# Patient Record
Sex: Female | Born: 1935
Health system: Southern US, Community
[De-identification: ages and names within clinical notes are randomized; demographics above are authoritative.]

## PROBLEM LIST (undated history)

## (undated) DIAGNOSIS — I5032 Chronic diastolic (congestive) heart failure: Secondary | ICD-10-CM

## (undated) DIAGNOSIS — I4430 Unspecified atrioventricular block: Secondary | ICD-10-CM

## (undated) DIAGNOSIS — L0390 Cellulitis, unspecified: Secondary | ICD-10-CM

## (undated) DIAGNOSIS — M109 Gout, unspecified: Secondary | ICD-10-CM

## (undated) DIAGNOSIS — Z950 Presence of cardiac pacemaker: Secondary | ICD-10-CM

## (undated) DIAGNOSIS — I10 Essential (primary) hypertension: Secondary | ICD-10-CM

## (undated) DIAGNOSIS — E876 Hypokalemia: Secondary | ICD-10-CM

## (undated) DIAGNOSIS — R011 Cardiac murmur, unspecified: Secondary | ICD-10-CM

## (undated) DIAGNOSIS — R001 Bradycardia, unspecified: Secondary | ICD-10-CM

## (undated) DIAGNOSIS — I272 Pulmonary hypertension, unspecified: Secondary | ICD-10-CM

## (undated) DIAGNOSIS — IMO0002 Reserved for concepts with insufficient information to code with codable children: Secondary | ICD-10-CM

## (undated) DIAGNOSIS — R609 Edema, unspecified: Secondary | ICD-10-CM

## (undated) DIAGNOSIS — M1990 Unspecified osteoarthritis, unspecified site: Secondary | ICD-10-CM

## (undated) DIAGNOSIS — E119 Type 2 diabetes mellitus without complications: Secondary | ICD-10-CM

## (undated) DIAGNOSIS — R9430 Abnormal result of cardiovascular function study, unspecified: Secondary | ICD-10-CM

## (undated) HISTORY — DX: Pulmonary hypertension, unspecified: I27.20

## (undated) HISTORY — DX: Chronic diastolic (congestive) heart failure: I50.32

## (undated) HISTORY — DX: Presence of cardiac pacemaker: Z95.0

## (undated) HISTORY — PX: CHOLECYSTECTOMY: SHX55

## (undated) HISTORY — DX: Cardiac murmur, unspecified: R01.1

## (undated) HISTORY — DX: Unspecified atrioventricular block: I44.30

## (undated) HISTORY — DX: Gout, unspecified: M10.9

## (undated) HISTORY — DX: Edema, unspecified: R60.9

## (undated) HISTORY — DX: Cellulitis, unspecified: L03.90

## (undated) HISTORY — DX: Reserved for concepts with insufficient information to code with codable children: IMO0002

## (undated) HISTORY — DX: Bradycardia, unspecified: R00.1

## (undated) HISTORY — PX: PARTIAL HYSTERECTOMY: SHX80

## (undated) HISTORY — DX: Essential (primary) hypertension: I10

## (undated) HISTORY — PX: HIP ARTHROPLASTY: SHX981

## (undated) HISTORY — DX: Abnormal result of cardiovascular function study, unspecified: R94.30

## (undated) HISTORY — DX: Hypokalemia: E87.6

---

## 2001-04-22 ENCOUNTER — Emergency Department (HOSPITAL_COMMUNITY): Admission: EM | Admit: 2001-04-22 | Discharge: 2001-04-22 | Payer: Self-pay | Admitting: Emergency Medicine

## 2001-10-02 ENCOUNTER — Emergency Department (HOSPITAL_COMMUNITY): Admission: EM | Admit: 2001-10-02 | Discharge: 2001-10-02 | Payer: Self-pay | Admitting: Internal Medicine

## 2003-02-16 ENCOUNTER — Emergency Department (HOSPITAL_COMMUNITY): Admission: EM | Admit: 2003-02-16 | Discharge: 2003-02-16 | Payer: Self-pay | Admitting: Emergency Medicine

## 2004-12-19 ENCOUNTER — Emergency Department (HOSPITAL_COMMUNITY): Admission: EM | Admit: 2004-12-19 | Discharge: 2004-12-19 | Payer: Self-pay | Admitting: Emergency Medicine

## 2007-01-05 ENCOUNTER — Ambulatory Visit (HOSPITAL_COMMUNITY): Admission: RE | Admit: 2007-01-05 | Discharge: 2007-01-05 | Payer: Self-pay | Admitting: Family Medicine

## 2008-07-18 ENCOUNTER — Ambulatory Visit (HOSPITAL_COMMUNITY): Admission: RE | Admit: 2008-07-18 | Discharge: 2008-07-18 | Payer: Self-pay | Admitting: Family Medicine

## 2010-06-19 ENCOUNTER — Emergency Department (HOSPITAL_COMMUNITY): Admission: EM | Admit: 2010-06-19 | Discharge: 2010-06-19 | Payer: Self-pay | Admitting: Emergency Medicine

## 2010-09-24 ENCOUNTER — Ambulatory Visit (HOSPITAL_COMMUNITY): Admission: RE | Admit: 2010-09-24 | Discharge: 2010-09-24 | Payer: Self-pay | Admitting: Rheumatology

## 2010-09-29 ENCOUNTER — Encounter: Admission: RE | Admit: 2010-09-29 | Discharge: 2010-09-29 | Payer: Self-pay | Admitting: Rheumatology

## 2010-11-11 ENCOUNTER — Encounter: Payer: Self-pay | Admitting: Cardiology

## 2010-11-26 ENCOUNTER — Encounter: Payer: Self-pay | Admitting: Cardiology

## 2010-11-29 ENCOUNTER — Encounter (INDEPENDENT_AMBULATORY_CARE_PROVIDER_SITE_OTHER): Payer: Self-pay | Admitting: Family Medicine

## 2010-11-29 ENCOUNTER — Ambulatory Visit (HOSPITAL_COMMUNITY)
Admission: RE | Admit: 2010-11-29 | Discharge: 2010-11-29 | Payer: Self-pay | Source: Home / Self Care | Attending: Family Medicine | Admitting: Family Medicine

## 2010-12-10 ENCOUNTER — Encounter: Payer: Self-pay | Admitting: Cardiology

## 2010-12-20 ENCOUNTER — Encounter: Payer: Self-pay | Admitting: Cardiology

## 2010-12-20 ENCOUNTER — Telehealth: Payer: Self-pay | Admitting: Cardiology

## 2010-12-29 NOTE — Progress Notes (Signed)
Summary: wants to be [name] pt  Phone Note Call from Patient Call back at [phone]   Caller: Daughter/rosa  Reason for Call: Talk to Nurse Summary of Call: pt daughter states she wants to be a pt of dr [name]. pt daughter states her father was a pt with dr [name] and the pt wants to have the same dr. Initial call taken by: Roe Coombs,  December 20, 2010 2:43 PM  Follow-up for Phone Call        Spoke with Dr. Myrtis Ser RN and scheduled patient to see Dr. Myrtis [name] on 01/14/11 @ 1:45pm. She has been having LE edema and would like to see Dr. Myrtis [name] b/c he took care of her husband. Whitney Maeola Sarah RN  December 20, 2010 4:05 PM  Follow-up by: Whitney Maeola Sarah RN,  December 20, 2010 4:05 PM

## 2011-01-05 ENCOUNTER — Ambulatory Visit: Payer: Self-pay | Admitting: Cardiology

## 2011-01-12 ENCOUNTER — Telehealth: Payer: Self-pay | Admitting: Cardiology

## 2011-01-14 ENCOUNTER — Encounter: Payer: Self-pay | Admitting: Cardiology

## 2011-01-14 ENCOUNTER — Ambulatory Visit (INDEPENDENT_AMBULATORY_CARE_PROVIDER_SITE_OTHER): Payer: Medicare Other | Admitting: Cardiology

## 2011-01-14 DIAGNOSIS — R609 Edema, unspecified: Secondary | ICD-10-CM

## 2011-01-14 DIAGNOSIS — I441 Atrioventricular block, second degree: Secondary | ICD-10-CM

## 2011-01-14 DIAGNOSIS — L0291 Cutaneous abscess, unspecified: Secondary | ICD-10-CM

## 2011-01-17 ENCOUNTER — Encounter: Payer: Self-pay | Admitting: Cardiology

## 2011-01-18 NOTE — Progress Notes (Signed)
Summary: question re med. records   Phone Note Call from Patient Call back at Home Phone [phone]   Caller: Daughter/[phone] Reason for Call: Talk to Nurse Summary of Call: pt daughter wants to know if her mother needs to bring any xrays. Initial call taken by: Roe Coombs,  January 12, 2011 8:52 AM  Follow-up for Phone Call        pt sch to see Dr Myrtis Ser as new pt on Fri, attempted to call daughter back, NA x 8 rings Meredith Staggers, RN  January 12, 2011 9:38 AM   Pt daughter calling back call daughter at [phone] on cell phone Judie Grieve  January 12, 2011 12:10 PM  Left message to call back Meredith Staggers, RN  January 12, 2011 12:29 PM   Additional Follow-up for Phone Call Additional follow up Details #1::        spoke w/pts daughter, we have all of pts records from Dr Azzie Almas, RN  January 12, 2011 1:41 PM

## 2011-01-18 NOTE — Miscellaneous (Signed)
  Clinical Lists Changes  Observations: Added new observation of PAST MED HX: Edema EF 65-70%  ..echo... November 29, 2010 Pulmonary hypertension   46 mmHg... echo... January, 201 to  (01/14/2011 8:27) Added new observation of PRIMARY MD: Dr Thalia Party McInnis (01/14/2011 8:27)       Past History:  Past Medical History: Edema EF 65-70%  ..echo... November 29, 2010 Pulmonary hypertension   46 mmHg... echo... January, 201 to

## 2011-01-18 NOTE — Assessment & Plan Note (Signed)
Summary: np6/wpa Patient having LE edema.   Visit Type:  Initial Consult Primary Provider:   Butch Penny, MD  CC:  edema.  History of Present Illness: The patient is referred for evaluation of edema.  I have not seen her as a patient in the past.  I did take care of her husband for many years.  The patient does not have known coronary disease.  She has been bothered with edema for several months.  Her medications have been adjusted including furosemide.  Despite this she continues to have significant edema.  She is not having chest pain, PND, or orthopnea.  Her ambulation is quite limited in general.  This is because of overall leg weakness.  She is mostly at home.  The patient's primary physician and arrange for her to have a 2-D echo on November 29, 2010.  Her ejection fraction was 65-70%.  She does have pulmonary hypertension with right ventricular systolic pressure estimate 46 mm of mercury.  There is no description of significant right ventricular dysfunction.  Labs show that the patient did have a BNP of 150 followed by a BNP of 100  Preventive Screening-Counseling & Management  Alcohol-Tobacco     Smoking Status: never  Caffeine-Diet-Exercise     Does Patient Exercise: no      Drug Use:  no.    Current Medications (verified): 1)  Multivitamins   Tabs (Multiple Vitamin) .... Once Daily 2)  Vitamin D 1000 Unit  Tabs (Cholecalciferol) .... Two Times A Day 3)  Potassium Chloride Crys Cr 20 Meq Cr-Tabs (Potassium Chloride Crys Cr) .... Take One Tablet By Mouth Twice A Day 4)  Furosemide 40 Mg Tabs (Furosemide) .... Take One Tablet By Mouth Two Times A Day 5)  Diazepam 5 Mg Tabs (Diazepam) .... Every 4 Hrs As Needed 6)  Cyclobenzaprine Hcl 5 Mg Tabs (Cyclobenzaprine Hcl) .[name].. 1-2 Four Times A Day As Needed 7)  Meloxicam 15 Mg Tabs (Meloxicam) .... Once Daily 8)  Hydrocodone-Acetaminophen 10-500 Mg Tabs (Hydrocodone-Acetaminophen) .... Every 4 Hrs As Needed  Allergies  (verified): 1)  ! Jonne Ply  Past History:  Past Medical History: Edema EF 65-70%  ..echo... November 29, 2010 Pulmonary hypertension   46 mmHg... echo... January, 201 to Superficial cellulitis right lower leg   January 14, 2011 AV Block    2:1  January 14, 2011  Past Surgical History: Partial hysterectomy Hip Arthroplasty-Total  Family History: Family History of Cardiomyopathy: dad Family History of Diabetes: dad and others Family History of Cancer: son Family History of Coronary Artery Disease: son  Social History: Retired  Widowed  Tobacco Use - No.  Alcohol Use - no Regular Exercise - no Drug Use - no Smoking Status:  never Does Patient Exercise:  no Drug Use:  no  Review of Systems       Patient denies fever, chills, headache, sweats, change in vision, change in hearing, chest pain, cough, nausea vomiting, urinary symptoms.  All other systems are reviewed and are negative.  Vital Signs:  Patient profile:   77 year old female Height:      63 inches Weight:      180 pounds BMI:     32.00 Pulse rate:   48 / minute BP sitting:   164 / 66  (right arm) Cuff size:   regular  Vitals Entered By: Hardin Negus, RMA (January 14, 2011 2:10 PM)  Physical Exam  General:  Patient is stable.  She is in a wheelchair.  She  is overweight. Head:  head is atraumatic. Eyes:  no xanthelasma. Neck:  no jugular venous distention. Chest Wall:  no chest wall tenderness. Lungs:  lungs are clear.  Respiratory effort is nonlabored. Heart:  cardiac exam reveals S1-S2.  There is a soft systolic murmur. Abdomen:  abdomen is soft. Msk:  difficult to fully assess the patient's feet with the marked edema. Extremities:  2+ peripheral edema bilaterally in the lower extremity. Skin:  there is warmth and redness in the medial aspect of the right lower extremity. Psych:  Patient is oriented to person time and place.  Affect is normal.  She is here with her daughter.   Impression &  Recommendations:  Problem # 1:  EDEMA (ICD-782.3) The patient has had significant edema.  She has normal systolic function.  Some of this may be from diastolic dysfunction.  It is possible that her bradycardia plays a role.  She is not hypotensive.  She does drink extra fluid during the day.  She is careful to watch her sodium intake.  Renal function is normal by review of her labs.  I've asked her to cut back one bottle of the extra fluid that she drinks each day.  She will continue to watch her sodium intake.  Her Lasix will be increased to 80 mg 2 times a day for 3 days and then back to 40 mg p.o. b.i.d. she will continue to try to elevate her feet as much as possible.  Problem # 2:  * SUPERFICIAL CELLULITIS it is likely that the skin changes are related to her edema.  I will give her Keflex to be sure there is no superficial infection that has not been treated.  Problem # 3:  ATRIOVENTRICULAR BLOCK, 2ND DEGREE (ICD-426.13)  EKG is done today and reviewed by me.  There is left ventricular hypertrophy.  There appears to be 2:1 AV block.  I have no old EKG for comparison.  The patient will wear a 24-hour Holter monitor to give me a better idea what her underlying rhythm is.  We will discuss this further when I see her back.  She has not had syncope or presyncope.  Orders: Holter Monitor (Holter Monitor)  Patient Instructions: 1)  Your physician recommends that you schedule a follow-up appointment in: 2 weeks with Dr. Myrtis Ser 2)  Your physician has recommended you make the following change in your medication: INCREASE LASIX TO 80MG  TWICE A DAY FOR 3 DAYS. THEN DECREASE LASIX BACK TO 40MG  TWICE A DAY 3)  DRINK ONLY 1 GATORADE BOTTLE OF WATER A DAY 4)  Your physician has recommended that you wear a 24 hour holter monitor.  Holter monitors are medical devices that record the heart's electrical activity. Doctors most often use these monitors to diagnose arrhythmias. Arrhythmias are problems with the  speed or rhythm of the heartbeat. The monitor is a small, portable device. You can wear one while you do your normal daily activities. This is usually used to diagnose what is causing palpitations/syncope (passing out). Prescriptions: KEFLEX 500 MG CAPS (CEPHALEXIN) 1 capsule twice a day for 7 days  #14 x 0   Entered by:   Lisabeth Devoid RN   Authorized by:   Talitha Givens, MD, [facility]   Signed by:   Lisabeth Devoid RN on 01/14/2011   Method used:   Faxed to ...       [facility] (retail)       [address]  [place], [place]  [address]  [place]       Ph: [phone]       Fax: [phone]   RxID:   [id number]

## 2011-01-24 ENCOUNTER — Telehealth: Payer: Self-pay | Admitting: Cardiology

## 2011-01-28 ENCOUNTER — Encounter: Payer: Self-pay | Admitting: Cardiology

## 2011-01-28 ENCOUNTER — Other Ambulatory Visit: Payer: Self-pay | Admitting: Cardiology

## 2011-01-28 ENCOUNTER — Ambulatory Visit (INDEPENDENT_AMBULATORY_CARE_PROVIDER_SITE_OTHER): Payer: Medicare Other | Admitting: Cardiology

## 2011-01-28 ENCOUNTER — Telehealth: Payer: Self-pay | Admitting: Cardiology

## 2011-01-28 DIAGNOSIS — E8779 Other fluid overload: Secondary | ICD-10-CM

## 2011-01-28 DIAGNOSIS — I441 Atrioventricular block, second degree: Secondary | ICD-10-CM

## 2011-01-28 DIAGNOSIS — R609 Edema, unspecified: Secondary | ICD-10-CM

## 2011-01-28 LAB — BASIC METABOLIC PANEL
Chloride: 105 mEq/L (ref 96–112)
GFR: 75.14 mL/min (ref >60.00)
Potassium: 4.1 mEq/L (ref 3.5–5.1)
Sodium: 141 mEq/L (ref 135–145)

## 2011-02-01 NOTE — Progress Notes (Addendum)
Summary: [facility] calling re furosimide sent in today  Phone Note From Pharmacy   Caller: [phone] [facility] Summary of Call: pharmacy has question re furosemide sent in today Initial call taken by: Glynda Jaeger,  January 28, 2011 10:19 AM  Follow-up for Phone Call        I spoke with pharmacist, Ronaldo Miyamoto, and they wanted clarification of furosemide as they had a 40mg  prescription for her as well as the 80mg  script sent today.   they will fill the 80mg  furosemide two times a day  Follow-up by: Lisabeth Devoid RN,  January 28, 2011 10:46 AM

## 2011-02-03 ENCOUNTER — Encounter: Payer: Self-pay | Admitting: Internal Medicine

## 2011-02-03 ENCOUNTER — Ambulatory Visit (INDEPENDENT_AMBULATORY_CARE_PROVIDER_SITE_OTHER): Payer: Medicare Other | Admitting: Internal Medicine

## 2011-02-03 DIAGNOSIS — I441 Atrioventricular block, second degree: Secondary | ICD-10-CM

## 2011-02-03 DIAGNOSIS — I509 Heart failure, unspecified: Secondary | ICD-10-CM

## 2011-02-04 ENCOUNTER — Telehealth: Payer: Self-pay | Admitting: Internal Medicine

## 2011-02-08 NOTE — Letter (Signed)
Summary: Implantable Device Instructions  [facility]ologist, Main Office  [address]   [place], [place] [address]   Phone: [phone]  Fax: [phone]      Implantable Device Instructions  You are scheduled for:  ___X__ Permanent Transvenous Pacemaker _____ Implantable Cardioverter Defibrillator _____ Implantable Loop Recorder _____ Generator Change  on __3/30/12___ with Dr. _TAYLOR____.  1.  Please arrive at the Short Stay Center at [facility] at _7:30 AM____ on the day of your procedure.  2.  Do not eat or drink the night before your procedure.  3.  Complete lab work on __3/23/12 AT 3:00 PM___.  The lab at [facility] is open from 8:30 AM to 1:30 PM and from 2:30 PM to 5:00 PM.  The lab at [facility] is open from 7:30 AM to 5:30 PM.  You do not have to be fasting.  4.  Do NOT take these medications for  HOLD FUROSEMIDE 80 MG AM OF PROCEDURE  5.  Plan for an overnight stay.  Bring your insurance cards and a list of your medications.  6.  Wash your chest and neck with antibacterial soap (any brand) the evening before and the morning of your procedure.  Rinse well.  7.  Education material received:     Pacemaker ___X__           ICD _____           Arrhythmia _____  *If you have ANY questions after you get home, please call the office [phone].  *Every attempt is made to prevent procedures from being rescheduled.  Due to the nauture of Electrophysiology, rescheduling can happen.  The physician is always aware and directs the staff when this occurs.

## 2011-02-08 NOTE — Procedures (Signed)
Summary: summary report  summary report   Imported By: Mirna Mires 01/31/2011 10:14:22  _____________________________________________________________________  External Attachment:    Type:   Image     Comment:   External Document

## 2011-02-08 NOTE — Progress Notes (Signed)
Summary: pt's dtr calling re abx  Phone Note Call from Patient   Caller: [phone] dtr [name] [phone] Reason for Call: Talk to Nurse Summary of Call: pt's dtr calling re getting abx-pt still having problems Initial call taken by: Glynda Jaeger,  January 24, 2011 1:56 PM  Follow-up for Phone Call        Patient's leg is red and warm. She has finished her antbx. prescribed by Dr. Myrtis Ser.  Her leg does not look any worse from when she saw Dr. Myrtis Ser last week but is still burning and is painful. She has an appt. to see Dr. Myrtis Ser on Friday but is wondering if she should start another antbx. I will forward this to Dr. Myrtis Ser for review to see if he would like for her to wait until her f/u with him or see the PA sooner than Friday. Advised pt. to keep the area clean & dry.  Whitney Maeola Sarah RN  January 24, 2011 3:09 PM  Follow-up by: Whitney Maeola Sarah RN,  January 24, 2011 3:09 PM  Additional Follow-up for Phone Call Additional follow up Details #1::        I saw pt. in office after this.

## 2011-02-08 NOTE — Assessment & Plan Note (Signed)
Summary: f2w/ per Dr.Caitrin Pendergraph   Visit Type:  Follow-up Primary Provider:   Butch Penny, MD  CC:  bradycardia and fluid overload.  History of Present Illness: The patient is seen for followup of fluid overload and bradycardia.  I saw her last January 14, 2011.  At that time I thought she might have 21 AV block.  She has weakness but she has not had syncope.  She has worn a Holter and I have reviewed in length.  It appears to me that there is 2:1 block.  I do not know if this is wenchebach or not.  There is no high degree AV block.  I have increased her diuretics for a few days.  I then cut back to her usual dose.  She continues to have edema.  She continues to have some redness in her right lower extremity.  I had treated this with Keflex.  It is somewhat improved.  I'm not convinced that represents active cellulitis any longer.  Current Medications (verified): 1)  Multivitamins   Tabs (Multiple Vitamin) .... Once Daily 2)  Vitamin D 1000 Unit  Tabs (Cholecalciferol) .... Two Times A Day 3)  Potassium Chloride Crys Cr 20 Meq Cr-Tabs (Potassium Chloride Crys Cr) .... Take One Tablet By Mouth Twice A Day 4)  Furosemide 40 Mg Tabs (Furosemide) .... Take One Tablet By Mouth Two Times A Day 5)  Diazepam 5 Mg Tabs (Diazepam) .... Every 4 Hrs As Needed 6)  Cyclobenzaprine Hcl 5 Mg Tabs (Cyclobenzaprine Hcl) .[name].. 1-2 Four Times A Day As Needed 7)  Meloxicam 15 Mg Tabs (Meloxicam) .... Once Daily 8)  Hydrocodone-Acetaminophen 10-500 Mg Tabs (Hydrocodone-Acetaminophen) .... Every 4 Hrs As Needed 9)  Keflex 500 Mg Caps (Cephalexin) .[name].. 1 Capsule Twice A Day For 7 Days  Allergies: 1)  ! Jonne Ply  Past History:  Past Medical History: Last updated: 01/14/2011 Edema EF 65-70%  ..echo... November 29, 2010 Pulmonary hypertension   46 mmHg... echo... January, 201 to Superficial cellulitis right lower leg   January 14, 2011 AV Block    2:1  January 14, 2011  Review of Systems       Patient denies  fever, chills, headache, sweats, rash, change in vision, change in hearing, chest pain, cough, nausea vomiting, urinary symptoms.  All of the systems are reviewed and are negative.  Vital Signs:  Patient profile:   77 year old female Height:      63 inches Weight:      180 pounds BMI:     32.00 Pulse rate:   49 / minute Resp:     16 per minute BP sitting:   165 / 51  (left arm)  Vitals Entered By: Kem Parkinson (January 28, 2011 9:28 AM)  Physical Exam  General:  patient is stable today but in wheelchair. Head:  head is atraumatic. Eyes:  no xanthelasma. Neck:  no jugular venous distention. Chest Wall:  no chest wall tenderness. Lungs:  lungs are clear.  Respiratory effort is nonlabored. Heart:  cardiac exam reveals S1-S2.  No clicks or significant murmurs. Abdomen:  abdomen is soft. Msk:  patient does have deformities of her ankles. Extremities:  2+ peripheral edema. Skin:  The area on her right anterior lower extremity is still mildly warm and slightly reddened. Psych:  patient is oriented to person time and place.  Affect is normal.   Impression & Recommendations:  Problem # 1:  ATRIOVENTRICULAR BLOCK, 2ND DEGREE (ICD-426.13)  As described in the  history of present illness I'm still not totally sure about her rhythm.  I believe it probably is 2-1 AV block.  I suspect that she will do better with sinus rhythm with 1:1 conduction.I will refer her to electrophysiology for further assessment.  It is possible that a pacemaker may be indicated both for her rhythm and her symptoms of fatigue and her volume overload.  Orders: EP Referral (Cardiology EP Ref ) TLB-BMP (Basic Metabolic Panel-BMET) (80048-[id number])  Problem # 2:  * SUPERFICIAL CELLULITIS I believe that the mild area on her leg will improve as she is diuresis.  She does need to keep her legs elevated.  I've chosen not to use any further antibiotics  Problem # 3:  EDEMA (ICD-782.3)  The patient's weight is a same.   I will increase her diuretic to 80 mg p.o. b.i.d.  Chemistry will be checked today.  I'll see her for followup.  Orders: TLB-BMP (Basic Metabolic Panel-BMET) ([id number])  Patient Instructions: 1)  Your physician recommends that you schedule a follow-up appointment in: 4 weeks with Dr. Myrtis Ser 2)  Your physician recommends that you  have lab work today 3)  Your physician has recommended you make the following change in your medication: Increase Furosemide 4)  You have been referred to our Electrophysiology doctors for your heart rhythm.   Prescriptions: FUROSEMIDE 80 MG TABS (FUROSEMIDE) Take one tablet by mouth two times a day  #120 x 6   Entered by:   Lisabeth Devoid RN   Authorized by:   Talitha Givens, MD, [facility]   Signed by:   Lisabeth Devoid RN on 01/28/2011   Method used:   Faxed to ...       [address] (retail)       [address]       [place], [place]  [address]  [place]       Ph: [phone]       Fax: [phone]   RxID:   [id number]

## 2011-02-08 NOTE — Assessment & Plan Note (Signed)
Summary: EVAL FOR 2:1 AV BLOCK POSSIBLE PACEMAKER PER KATZ/SL   Visit Type:  Follow-up Primary Provider:   Butch Penny, MD  CC:  Possible pacemaker evaluation.  History of Present Illness: [name] is referred today by Dr. Myrtis Ser for consideration for PPM. She is a pleasant 77 yo woman with a h/o fatigue and weakness who was found on Holter monitoring to have 2:1 AV block both during the day and at night. She has not had frank syncope. She is not on any AV nodal blocking drugs. She had been bothered by some superficial cellulitis of the lower extremity. She does have mild pulmonary hypertension. She has 2+ peripheral edema.  Current Medications (verified): 1)  Multivitamins   Tabs (Multiple Vitamin) .... Once Daily 2)  Vitamin D 1000 Unit  Tabs (Cholecalciferol) .... Two Times A Day 3)  Potassium Chloride Crys Cr 20 Meq Cr-Tabs (Potassium Chloride Crys Cr) .... Take One Tablet By Mouth Twice A Day 4)  Furosemide 80 Mg Tabs (Furosemide) .... Take One Tablet By Mouth Two Times A Day 5)  Diazepam 5 Mg Tabs (Diazepam) .... Every 4 Hrs As Needed 6)  Cyclobenzaprine Hcl 5 Mg Tabs (Cyclobenzaprine Hcl) .[name].. 1-2 Four Times A Day As Needed 7)  Meloxicam 15 Mg Tabs (Meloxicam) .... Once Daily 8)  Hydrocodone-Acetaminophen 10-500 Mg Tabs (Hydrocodone-Acetaminophen) .... Every 4 Hrs As Needed 9)  Keflex 500 Mg Caps (Cephalexin) .[name].. 1 Capsule Twice A Day For 7 Days  Allergies: 1)  ! Jonne Ply  Past History:  Past Medical History: Last updated: 01/14/2011 Edema EF 65-70%  ..echo... November 29, 2010 Pulmonary hypertension   46 mmHg... echo... January, 201 to Superficial cellulitis right lower leg   January 14, 2011 AV Block    2:1  January 14, 2011  Past Surgical History: Last updated: 01/14/2011 Partial hysterectomy Hip Arthroplasty-Total  Family History: Last updated: 01/14/2011 Family History of Cardiomyopathy: dad Family History of Diabetes: dad and others Family History of Cancer:  son Family History of Coronary Artery Disease: son  Social History: Last updated: 01/14/2011 Retired  Widowed  Tobacco Use - No.  Alcohol Use - no Regular Exercise - no Drug Use - no  Review of Systems       All systems reviewed and negative except as noted in the HPI and "stinging" in her legs when she wears TED hose.  Vital Signs:  Patient profile:   77 year old female Height:      63 inches Pulse rate:   49 / minute Pulse rhythm:   irregular Resp:     18 per minute BP sitting:   180 / 47  (left arm) Cuff size:   large  Vitals Entered By: Vikki Ports (February 03, 2011 10:25 AM)  Physical Exam  General:  patient is stable today but in wheelchair. Head:  head is atraumatic. Eyes:  no xanthelasma. Neck:  no jugular venous distention. Lungs:  lungs are clear.  Respiratory effort is nonlabored. Heart:  cardiac exam reveals S1-S2.  No clicks or significant murmurs. Abdomen:  abdomen is soft. Msk:  patient does have deformities of her ankles. Extremities:  2+ peripheral edema.   EKG  Procedure date:  02/03/2011  Findings:      Normal sinus rhythm with rate of:  98 with 2:1 AV block.  Impression & Recommendations:  Problem # 1:  ATRIOVENTRICULAR BLOCK, 2ND DEGREE (ICD-426.13) The patient has 2:1 AV block for which she is symptomatic. I have discussed the risks/benefits/goals/expectations of PPM  and she wishes to proceed.  Problem # 2:  EDEMA (ICD-782.3) I have encouraged a low sodium diet.

## 2011-02-10 ENCOUNTER — Telehealth: Payer: Self-pay | Admitting: Cardiology

## 2011-02-10 DIAGNOSIS — M7989 Other specified soft tissue disorders: Secondary | ICD-10-CM

## 2011-02-10 NOTE — Telephone Encounter (Signed)
Spoke w/[name] she states she is concerned about her moms leg, she states it is still swelling and she wants to make sure its ok for pacer implant next week.  She doesn't know much more info so pt was called, she states leg overall seems some better w/increased furosemide, no SOB, leg is still red and painful, there is no drainage or open wound, she has not had any fevers, will let Dr Myrtis Ser know and call her back

## 2011-02-11 ENCOUNTER — Other Ambulatory Visit (INDEPENDENT_AMBULATORY_CARE_PROVIDER_SITE_OTHER): Payer: Medicare Other | Admitting: *Deleted

## 2011-02-11 DIAGNOSIS — Z79899 Other long term (current) drug therapy: Secondary | ICD-10-CM

## 2011-02-11 DIAGNOSIS — I498 Other specified cardiac arrhythmias: Secondary | ICD-10-CM

## 2011-02-11 DIAGNOSIS — R609 Edema, unspecified: Secondary | ICD-10-CM

## 2011-02-11 DIAGNOSIS — R0602 Shortness of breath: Secondary | ICD-10-CM

## 2011-02-11 DIAGNOSIS — I441 Atrioventricular block, second degree: Secondary | ICD-10-CM

## 2011-02-11 DIAGNOSIS — Z01810 Encounter for preprocedural cardiovascular examination: Secondary | ICD-10-CM

## 2011-02-11 DIAGNOSIS — I4430 Unspecified atrioventricular block: Secondary | ICD-10-CM

## 2011-02-11 LAB — CBC WITH DIFFERENTIAL/PLATELET
Basophils Absolute: 0.1 10*3/uL (ref 0.0–0.1)
Eosinophils Relative: 3.8 % (ref 0.0–5.0)
HCT: 44.3 % (ref 36.0–46.0)
Lymphocytes Relative: 29.5 % (ref 12.0–46.0)
Lymphs Abs: 2.2 10*3/uL (ref 0.7–4.0)
Monocytes Relative: 10.3 % (ref 3.0–12.0)
Neutrophils Relative %: 55.3 % (ref 43.0–77.0)
Platelets: 311 10*3/uL (ref 150.0–400.0)
RDW: 13.7 % (ref 11.5–14.6)
WBC: 7.4 10*3/uL (ref 4.5–10.5)

## 2011-02-11 LAB — APTT: aPTT: 31.5 s — ABNORMAL HIGH (ref 21.7–28.8)

## 2011-02-11 LAB — PROTIME-INR
INR: 1 ratio (ref 0.8–1.0)
Prothrombin Time: 11.3 s (ref 10.2–12.4)

## 2011-02-11 LAB — BRAIN NATRIURETIC PEPTIDE: Pro B Natriuretic peptide (BNP): 53.7 pg/mL (ref 0.0–100.0)

## 2011-02-11 NOTE — Telephone Encounter (Signed)
We need to be sure that there is no definite infection and her leg or pacemakers placed.  I am not able to assess this problem any further.  Please make an appointment for the patient to be seen by infectious disease.  Please be sure that my recent notes and notes from our electrophysiology team go to the infectious disease consultant.

## 2011-02-11 NOTE — Telephone Encounter (Signed)
Per Dr Myrtis Ser he would like pt to hold off on pacer right now and have ID evaluate pts leg first, pts daughter is aware, am trying to get her in w/ID will call daughter back on Tue to touch base hopefully will have appt by then, will let Dr Ladona Ridgel and Tresa Endo know what is going on

## 2011-02-15 NOTE — Telephone Encounter (Signed)
Pt daughter is calling re pt surgery. Pt daughter wants to know why pt surgery was canceled

## 2011-02-16 ENCOUNTER — Telehealth: Payer: Self-pay | Admitting: Cardiology

## 2011-02-16 ENCOUNTER — Encounter: Payer: Self-pay | Admitting: Cardiology

## 2011-02-16 NOTE — Telephone Encounter (Signed)
Documented in other phone mess

## 2011-02-16 NOTE — Telephone Encounter (Signed)
Left vm for daughter that I am waiting to hear back from infectious disease

## 2011-02-16 NOTE — Telephone Encounter (Signed)
Daughter aware pacer implant cx'd for now, Tresa Endo is aware and has cx'd, am working on getting pt appt w/infectious disease

## 2011-02-17 NOTE — Progress Notes (Signed)
Summary: pt's dtr calling re procedure being done sooner**2nd call  Phone Note Call from Patient   Caller: Daughter Rosezella [place] [phone] Reason for Call: Talk to Nurse Summary of Call: pt's dtr calling re some problems pt 's having that may require her procedure to be moved up-pls call Initial call taken by: Glynda Jaeger,  February 04, 2011 8:24 AM  Follow-up for Phone Call        spoke w/pts daughter she is in [place], she is a little concerned about PPM not being sch until 3/30, pt is not any worse or having any new symptoms but cont to have fatigue, advised it isn't an emergency her symptoms have not changed, if Dr Ladona Ridgel thought it should be done right away they would have sch it sooner, Dr Ladona Ridgel only does procedures a day or so a week, she still would like to discuss w/Dr Ladona Ridgel since she wasn't unable to come to appt yest.  advised Dr Ladona Ridgel not here today, again reassured daughter and advised I could have Tresa Endo call her on Mon. she is agreeable, she states she just wants to know from the MD that its ok to wait the 2 weeks and she'll put it in Gods hands Follow-up by: Meredith Staggers, RN,  February 04, 2011 8:34 AM  Additional Follow-up for Phone Call Additional follow up Details #1::        pt daughter called again to see if procedure will be moved up cause she has to get off work and has to put her request in early enough to get here she lives out of town [place]  February 07, 2011 9:27 AM  spoke with daughter and let her know that it is okay to wait until until 02/18/11 as scheduled Dennis Bast, RN, BSN  February 07, 2011 2:20 PM

## 2011-02-18 ENCOUNTER — Ambulatory Visit (HOSPITAL_COMMUNITY): Admission: RE | Admit: 2011-02-18 | Payer: Medicare Other | Source: Ambulatory Visit | Admitting: Internal Medicine

## 2011-02-18 NOTE — Telephone Encounter (Signed)
Spoke w/daughter, she states ID has not called her to set up appt, explained they were suppose to call her appt will be in later April, Dr Myrtis Ser and Ladona Ridgel are ok w/waiting for pacer, will f/u w/ID and have them call her to sch appt

## 2011-02-21 ENCOUNTER — Telehealth: Payer: Self-pay | Admitting: Cardiology

## 2011-02-21 NOTE — Telephone Encounter (Signed)
appt w/infectious disease sch for 4/18 at 11:15

## 2011-02-21 NOTE — Telephone Encounter (Signed)
[name] still has not heard from ID, called them back they state they will call her now and call me back w/appt date

## 2011-02-21 NOTE — Telephone Encounter (Signed)
Doc. In phone encounter dated 3/22

## 2011-02-22 ENCOUNTER — Telehealth: Payer: Self-pay | Admitting: Cardiology

## 2011-02-22 NOTE — Telephone Encounter (Signed)
Pt daughter states pt is not feeling well pt is weak.

## 2011-02-22 NOTE — Telephone Encounter (Signed)
Spoke w/pt's daughter, she feels pt's leg is getting worse and she may need to be admitted, still w/some redness and swelling, no drainage, pt is sch to see Infectious disease later this month.  Daughter feels pt needs to be admitted so her leg can be evaluated, advised if she was that concerned about it could see pcp or urgent care to have evaluated.  Again ask about being admitted, advised if her and family felt she needed to be in the hospital she could go to ER for evaluation and they will admit her if they feel it is necessary , she states she will call and discuss w/her brother who lives here in town.

## 2011-02-28 ENCOUNTER — Encounter: Payer: Self-pay | Admitting: Cardiology

## 2011-03-02 ENCOUNTER — Ambulatory Visit (INDEPENDENT_AMBULATORY_CARE_PROVIDER_SITE_OTHER): Payer: Medicare Other | Admitting: Cardiology

## 2011-03-02 ENCOUNTER — Ambulatory Visit: Payer: Medicare Other | Admitting: Cardiology

## 2011-03-02 ENCOUNTER — Encounter: Payer: Self-pay | Admitting: Cardiology

## 2011-03-02 DIAGNOSIS — I272 Pulmonary hypertension, unspecified: Secondary | ICD-10-CM | POA: Insufficient documentation

## 2011-03-02 DIAGNOSIS — R609 Edema, unspecified: Secondary | ICD-10-CM

## 2011-03-02 DIAGNOSIS — I4430 Unspecified atrioventricular block: Secondary | ICD-10-CM | POA: Insufficient documentation

## 2011-03-02 DIAGNOSIS — L0291 Cutaneous abscess, unspecified: Secondary | ICD-10-CM

## 2011-03-02 DIAGNOSIS — L0390 Cellulitis, unspecified: Secondary | ICD-10-CM | POA: Insufficient documentation

## 2011-03-02 NOTE — Assessment & Plan Note (Signed)
The patient has intermittent 21 AV block.  She is stable with this.  There's been no syncope.  This can be followed clinically until the issue with her right leg is resolved.  After that time she can get her pacemaker.

## 2011-03-02 NOTE — Assessment & Plan Note (Signed)
I am concerned about the findings in her right leg.  We need to be sure that she does not have deep venous thrombosis.  I also need more help from the vascular surgeons with specialty in venous disease.  I've arranged for consultation with the vascular surgery team tomorrow.

## 2011-03-02 NOTE — Patient Instructions (Signed)
Your physician recommends that you schedule a follow-up appointment in: 6 weeks  

## 2011-03-02 NOTE — Assessment & Plan Note (Signed)
The patient has normal systolic function.  She does have a component of diastolic dysfunction.  However I'm hesitant to push her diuretics any further.  There is clearly a difference in her right leg from the left leg.

## 2011-03-02 NOTE — Progress Notes (Signed)
HPI The patient is seen today in followup to one AV block and swelling in her leg.  She has been seen by electrophysiology and it is felt  that a pacemaker would be appropriate.  This had been put on hold because of persistent discomfort in her right leg.  When I saw her I thought it was related to edema. I did give her a 10 day course of Keflex at that time.  This helped a small amount but the problem persists.  She has a lot of discomfort in this area.  Initially I thought there might be an ongoing infection and I asked for infectious disease consultation.  Because of scheduling difficulties this has been delayed.  The patient is here today and I'm concerned about her right lower extremity.  I do not believe that there is urgency but rapid evaluation by vascular surgery would be helpful.  I've arranged for that for tomorrow.  Allergies  Allergen Reactions  . Aspirin     REACTION: rash    Current Outpatient Prescriptions  Medication Sig Dispense Refill  . Cholecalciferol (VITAMIN D) 1000 UNITS capsule Take 1,000 Units by mouth 2 (two) times daily.        . cyclobenzaprine (FLEXERIL) 5 MG tablet Take 5 mg by mouth as directed.        . diazepam (VALIUM) 5 MG tablet 5 mg. Every 4 hours as needed       . furosemide (LASIX) 80 MG tablet Take 80 mg by mouth 2 (two) times daily.        [name] HYDROcodone-acetaminophen (LORTAB) 10-500 MG per tablet Take 1 tablet by mouth every 4 (four) hours as needed.        . meloxicam (MOBIC) 15 MG tablet Take 15 mg by mouth daily.        . Multiple Vitamin (MULTIVITAMIN) capsule Take 1 capsule by mouth daily.        . potassium chloride SA (K-DUR,KLOR-CON) 20 MEQ tablet Take 20 mEq by mouth 2 (two) times daily.          History   Social History  . Marital Status: Widowed    Spouse Name: N/A    Number of Children: N/A  . Years of Education: N/A   Occupational History  . Retired    Social History Main Topics  . Smoking status: Never Smoker   . Smokeless  tobacco: Not on file  . Alcohol Use: No  . Drug Use: No  . Sexually Active: Not on file   Other Topics Concern  . Not on file   Social History Narrative   Retired Widowed Tobacco Use - No. Alcohol Use - noRegular Exercise - noDrug Use - no    Family History  Problem Relation Age of Onset  . Cardiomyopathy Father   . Diabetes Father   . Cancer       son  . Coronary artery disease      son    Past Medical History  Diagnosis Date  . Edema     E. EF 65-70%, echo, November 29, 2010  . Pulmonary hypertension      46 mmHg... echo... January, 201 to  . Cellulitis     Superficial cellulitis right lower leg   January 14, 2011  . AV block      2:1  January 14, 2011 / pacemaker plan when cellulitis resolved in length    Past Surgical History  Procedure Date  . Partial hysterectomy   .  Hip arthroplasty     total    ROS  Patient denies fever, chills, headache, sweats, change in vision, change in hearing, chest pain, cough, nausea vomiting, urinary symptoms.  All other systems are reviewed and are negative.  PHYSICAL EXAM Patient is a wheelchair today.  Her right leg is mildly uncomfortable.  She is oriented to person time and place.  Affect is normal.  She is here with a family member.  Head is atraumatic.  There is no xanthelasma.  There is no guarding distention.  Lungs are clear.  Respiratory effort is unlabored.  Cardiac exam reveals S1-S2.  There are no clicks or significant murmurs.  The abdomen is soft.  The patient has trace edema in the left lower extremity.  There is more marked edema in her right lower extremity and there is warmth.  There is mild redness.  Filed Vitals:   03/02/11 1357  BP: 164/66  Pulse: 58  Resp: 18  Height: 5\' 2"  (1.575 m)  Weight: 173 lb 6.4 oz (78.654 kg)    EKG  Not done today.  ASSESSMENT & PLAN

## 2011-03-03 ENCOUNTER — Ambulatory Visit (INDEPENDENT_AMBULATORY_CARE_PROVIDER_SITE_OTHER): Payer: Medicare Other

## 2011-03-03 ENCOUNTER — Encounter (INDEPENDENT_AMBULATORY_CARE_PROVIDER_SITE_OTHER): Payer: Medicare Other

## 2011-03-03 DIAGNOSIS — M7989 Other specified soft tissue disorders: Secondary | ICD-10-CM

## 2011-03-03 DIAGNOSIS — R609 Edema, unspecified: Secondary | ICD-10-CM

## 2011-03-03 DIAGNOSIS — M79609 Pain in unspecified limb: Secondary | ICD-10-CM

## 2011-03-03 NOTE — Consult Note (Signed)
NEW PATIENT CONSULTATION  [name], SEGUIN [name] DOB:  1931/10/06                                       03/03/2011 [id number]#:[id number]  CHIEF COMPLAINT:  Right lower extremity swelling and tenderness, rule out DVT.  HISTORY OF PRESENT ILLNESS:  Patient is a 78 year old woman with a history of high blood pressure and congestive heart failure who has a many-month history of swelling and edema in bilateral lower extremities for which she is on 80 mg of Lasix twice daily.  She was put on compression stockings; however, she could not tolerate these on her leg, and it caused a small abrasion on the anterior shin of the right leg. She was recently seen by Dr. Myrtis Ser and found to have a swollen, tender right leg, which was more firm and also a little bit of cellulitis around this abrasion on the anterior shin of her right leg.  She was sent to [place] to rule out DVT and also for treatment of the abrasion.  She had a course of Keflex for this lesion approximately 4 weeks ago, and the patient states it did feel better.  She complained of a burning pain in the leg and around the abrasion but no claudication, as the patient is pretty much wheelchair-bound.  She denies any other rest pain or night pain in either lower extremity.  Past medical history significant for high blood pressure and congestive heart failure.  She also had a right hip replacement.  SOCIAL HISTORY:  She is retired and widowed.  She has never smoked, and she does not drink any alcohol.  FAMILY HISTORY:  Father had cardiomyopathy and diabetes.  She has a son with cancer and coronary artery disease.  REVIEW OF SYSTEMS:  She has had generalized weight loss.  She is 5 feet 2 inches tall, 173 pounds. VASCULAR:  She does have pain in her legs "with walking."  She denies any stroke, mini strokes or TIAs. CARDIAC:  She has shortness of breath with exertion secondary to her CHF.  She denies any arrhythmia or chest pain.   GASTROINTESTINAL:  She denies any black, tarry stools, hiatal hernia, diarrhea, or constipation. NEUROLOGIC:  She denies any dizziness, blackouts, headaches or seizures. PULMONARY:  She is not on home oxygen.  She denies any productive cough, asthma, wheezing or bronchitis. HEMATOLOGIC:  She had no bleeding or clotting disorders. URINARY:  She denies any kidney disease or burning or frequency with urination. EARS, NOSE, AND THROAT:  Negative for nosebleeds or sore throat. MUSCULOSKELETAL:  She does have back spasms. PSYCHIATRIC:  She denies any depression or anxiety.  PHYSICAL EXAMINATION:  This is an obese African American female in no acute distress.  Blood pressure is 164/66.  Her pulse was 60, respiratory rate was 18.  Her lungs are clear.  Heart:  Rate and rhythm was regular.  No clicks or significant murmurs.  Abdomen was soft.  She has pitting edema in bilateral lower extremities.  The right lower extremity has a small superficial abrasion on the anterior shin with some erythema around it.  The calf is more firm than the left; however, it is nontender.  She complains of burning pain to palpation on the medial aspect of the leg and shin near the abrasion.  She had good Doppler flow in the dorsalis pedis, posterior tibial, and peroneal arteries bilaterally.  Both  feet were warm and pink.  She has some venous stasis changes in the right lower extremity and some varicosities noted.  Ultrasound for DVT showed no DVT.  All vessels were compressible.  It was difficult to see some of the smaller veins per exam.  ASSESSMENT: 1. Probable peripheral vascular disease with small abrasion in the     anterior shin of the right lower extremity. 2. No deep venous thrombosis per Doppler study in the right lower     extremity.  PLAN:  Place the patient on Cipro for a week.  Will bring her back in 1- 2 weeks to be seen by one of the surgeons.  She will have ABIs at that time to assess blood  flow to bilateral lower extremities.  Della Goo, PA-C  Larina Earthly, M.D. Electronically Signed  RR/MEDQ  D:  03/03/2011  T:  03/03/2011  Job:  [id number]  cc:   Luis Abed, MD, [facility]

## 2011-03-07 ENCOUNTER — Ambulatory Visit: Payer: Medicare Other | Admitting: Infectious Diseases

## 2011-03-07 NOTE — Procedures (Unsigned)
DUPLEX DEEP VENOUS EXAM - LOWER EXTREMITY  INDICATION:  Bilateral lower extremity pain and edema.  HISTORY:  Edema:  Yes. Trauma/Surgery:  No. Pain:  Yes. PE:  No. Previous DVT:  No. Anticoagulants:  No. Other:  DUPLEX EXAM:               CFV   SFV   PopV  PTV       GSV               R  L  R  L  R  L  R    L    R  L Thrombosis    0  0  0  0  0  0  N/V  N/V  0  0 Spontaneous   +  +  +  +  +  +            +  + Phasic        +  +  +  +  +  +            +  + Augmentation  +  +  +  +  +  +            +  + Compressible  +  +  +  +  +  +            +  + Competent     +  +  +  +  +  +            +  +  Legend:  + - yes  o - no  p - partial  D - decreased   IMPRESSION: 1. Technically difficult and limited evaluation of the bilateral lower     extremities due to marked vessel depth edema, small vessel size and     patient inability to externally rotate extremities.  Calf veins not     visualized due to previous statement. 2. No evidence of deep venous thrombosis is identified in vessels     evaluated.        _____________________________ V. Charlena Cross, MD  SH/MEDQ  D:  03/03/2011  T:  03/03/2011  Job:  [id number]

## 2011-03-09 ENCOUNTER — Ambulatory Visit: Payer: Medicare Other | Admitting: Infectious Diseases

## 2011-03-14 ENCOUNTER — Ambulatory Visit (INDEPENDENT_AMBULATORY_CARE_PROVIDER_SITE_OTHER): Payer: Medicare Other | Admitting: Surgery

## 2011-03-14 ENCOUNTER — Telehealth: Payer: Self-pay | Admitting: Cardiology

## 2011-03-14 ENCOUNTER — Encounter (INDEPENDENT_AMBULATORY_CARE_PROVIDER_SITE_OTHER): Payer: Medicare Other

## 2011-03-14 DIAGNOSIS — L98499 Non-pressure chronic ulcer of skin of other sites with unspecified severity: Secondary | ICD-10-CM

## 2011-03-14 DIAGNOSIS — M79609 Pain in unspecified limb: Secondary | ICD-10-CM

## 2011-03-14 NOTE — Telephone Encounter (Signed)
Pt daughter needs to know the dr name for VASCULAR SURGERY referral.

## 2011-03-14 NOTE — Telephone Encounter (Signed)
Patient's daughter needed the number for the [facility] regarding an order for home health that they ordered. I gave her the number. She will call back if needed.

## 2011-03-15 NOTE — Assessment & Plan Note (Signed)
OFFICE VISIT  KAYLINN, DEDIC DOB:  June 23, 1931                                       03/14/2011 [id number]#:[id number]  REASON FOR VISIT:  Followup.  HISTORY:  This is a 77 year old female that saw Della Goo, PA-C last week for right lower extremity swelling and tenderness.  She was ruled out for DVT.  She did have an abrasion on the right anterior side of her ankle.  She is brought back today for wound check as well as arterial evaluation.  She was started on antibiotics.  The patient states that her swelling has gone down since her last visit.  PHYSICAL EXAM:  Vital signs:  Heart rate is 64 and irregular, blood pressure 140/72, respiratory rate 20.  General:  She is well-appearing, in no distress.  Cardiovascular:  Pedal pulses are not palpable.  She has marked edema on the left greater than right.  There is a small 2 cm abrasion to the anterior side of her right leg with surrounding erythema which has improved.  DIAGNOSTIC STUDIES:  Ultrasound was performed today.  Shows ABI of 0.96 on the right with triphasic signal, 0.61 on the left with biphasic signal.  PLAN:  With the patient's erythema around this small wound on her right shin I think that we need to be very aggressive in getting this to fully heal.  She obviously has adequate circulation to get this to heal.  I think it is limited by the swelling.  I am going to place her in an Unna boot for approximate 2 weeks to help with the swelling.  I am also going to continue her Cipro for another 10 days.  I will see her back in 2 weeks.  I am giving her a prescription for a girdle length compression stocking.    Jorge Ny, MD Electronically Signed  VWB/MEDQ  D:  03/14/2011  T:  03/15/2011  Job:  [id number]  cc:   Luis Abed, MD, [facility]

## 2011-03-28 ENCOUNTER — Ambulatory Visit: Payer: Medicare Other | Admitting: Surgery

## 2011-04-04 ENCOUNTER — Ambulatory Visit (INDEPENDENT_AMBULATORY_CARE_PROVIDER_SITE_OTHER): Payer: Medicare Other | Admitting: Surgery

## 2011-04-04 DIAGNOSIS — L98499 Non-pressure chronic ulcer of skin of other sites with unspecified severity: Secondary | ICD-10-CM

## 2011-04-04 DIAGNOSIS — I739 Peripheral vascular disease, unspecified: Secondary | ICD-10-CM

## 2011-04-05 NOTE — Assessment & Plan Note (Signed)
OFFICE VISIT  LATREASE, KUNDE DOB:  1931/08/18                                       04/04/2011 [id number]#:[id number]  Patient returns today for follow-up.  She was initially seen for right lower extremity swelling and tenderness.  She was ruled out for DVT. She did have an abrasion on the right side of her ankle.  She has an ABI of 0.96 on the right and 0.61 on the left with significant swelling.  I put her in an Radio broadcast assistant.  She is back today for follow-up.  Her wound is completely healed.  She has been fitted for girdle-type compression stockings.  PHYSICAL EXAMINATION:  Her heart rate is 44.  Her blood pressure is 211/40.  Respiratory rate is 40.  General:  She is resting comfortably in no distress.  Respirations are nonlabored.  Extremities:  The ulcer on the anterior side of the right leg has completely healed.  Pulses are not palpable.  She has significant bilateral edema.  At this point, I have reiterated that she is going to need to continue to have compression placed on her legs to avoid future ulcerations and healing issues.  She has already been fitted for this and is going to pick them up soon.  I will see her back on a p.r.n. basis.    Jorge Ny, MD Electronically Signed  VWB/MEDQ  D:  04/04/2011  T:  04/05/2011  Job:  [id number]  cc:   Dr. Myrtis Ser

## 2011-04-14 ENCOUNTER — Encounter: Payer: Self-pay | Admitting: Cardiology

## 2011-04-14 ENCOUNTER — Ambulatory Visit (INDEPENDENT_AMBULATORY_CARE_PROVIDER_SITE_OTHER): Payer: Medicare Other | Admitting: Cardiology

## 2011-04-14 DIAGNOSIS — I4430 Unspecified atrioventricular block: Secondary | ICD-10-CM

## 2011-04-14 DIAGNOSIS — L0390 Cellulitis, unspecified: Secondary | ICD-10-CM

## 2011-04-14 DIAGNOSIS — L0291 Cutaneous abscess, unspecified: Secondary | ICD-10-CM

## 2011-04-14 NOTE — Progress Notes (Signed)
HPI Patient is seen today for cardiology followup.  I saw her last March 02, 2011.  I was concerned about the cellulitis in her leg.  She was seen by Dr. Myra Gianotti and his group.  She ruled out for DVT.  There was an abrasion on the right side of the ankle.  ABI on the right leg was 0.96.  ABI on the left was 0.61.  An Neomia Dear boot was put on the right leg.  It was reported that her wound is completely healed.  She is to be fitted for girdle-type compression stocking but The patient has not yet picked them up .  It is hoped that she could wear something like this long-term.  She is feeling much better.  She still has swelling in her left leg but no discomfort.  She still has some burning in the right leg.  She says her right leg however feels much better. Allergies  Allergen Reactions  . Aspirin     REACTION: rash    Current Outpatient Prescriptions  Medication Sig Dispense Refill  . Cholecalciferol (VITAMIN D) 1000 UNITS capsule Take 1,000 Units by mouth 2 (two) times daily.        . cyclobenzaprine (FLEXERIL) 5 MG tablet Take 5 mg by mouth as directed.        . diazepam (VALIUM) 5 MG tablet 5 mg. Every 4 hours as needed       . furosemide (LASIX) 80 MG tablet Take 80 mg by mouth 2 (two) times daily.        [name] HYDROcodone-acetaminophen (LORTAB) 10-500 MG per tablet Take 1 tablet by mouth every 4 (four) hours as needed.        . meloxicam (MOBIC) 15 MG tablet Take 15 mg by mouth daily.        . Multiple Vitamin (MULTIVITAMIN) capsule Take 1 capsule by mouth daily.        . potassium chloride SA (K-DUR,KLOR-CON) 20 MEQ tablet Take 20 mEq by mouth 2 (two) times daily.          History   Social History  . Marital Status: Widowed    Spouse Name: N/A    Number of Children: N/A  . Years of Education: N/A   Occupational History  . Retired    Social History Main Topics  . Smoking status: Never Smoker   . Smokeless tobacco: Not on file  . Alcohol Use: No  . Drug Use: No  . Sexually Active: Not  on file   Other Topics Concern  . Not on file   Social History Narrative   Retired Widowed Tobacco Use - No. Alcohol Use - noRegular Exercise - noDrug Use - no    Family History  Problem Relation Age of Onset  . Cardiomyopathy Father   . Diabetes Father   . Cancer       son  . Coronary artery disease      son    Past Medical History  Diagnosis Date  . Edema     E. EF 65-70%, echo, November 29, 2010  . Pulmonary hypertension      46 mmHg... echo... January, 201 to  . Cellulitis     Superficial cellulitis right lower leg   January 14, 2011  . AV block      2:1  January 14, 2011 / pacemaker plan when cellulitis resolved in length    Past Surgical History  Procedure Date  . Partial hysterectomy   . Hip  arthroplasty     total    ROS  Patient denies fever, chills, headache, sweats, rash, change in vision, change in hearing, chest pain, cough, nausea vomiting, urinary symptoms.  All other systems are reviewed and are negative.  PHYSICAL EXAM Patient is overweight.  She is in a wheelchair.  She is oriented to person time and place.  Affect is normal.  She is here with a family member.  Head is atraumatic.  Lungs are clear.  Respiratory effort is nonlabored.  Cardiac exam reveals S1-S2.  There are no clicks or significant murmurs.  The abdomen is soft.  The patient has 1+ edema in her left lower extremity.  Her right leg is greatly improved since the last visit.  Edema is markedly decreased.  However there is some warmth in the lower leg. Filed Vitals:   04/14/11 1427  BP: 173/46  Pulse: 49  Resp: 14  Height: 5' (1.524 m)  Weight: 169 lb (76.658 kg)     ASSESSMENT & PLAN

## 2011-04-14 NOTE — Patient Instructions (Signed)
Your physician recommends that you schedule a follow-up appointment in: 6 weeks  

## 2011-04-14 NOTE — Assessment & Plan Note (Signed)
The patient is greatly improved since the last visit.  However there is some warmth in the area.  I have encouraged her to watch carefully.  If he begins to bother her more or swell I would like for her to see Dr. Myra Gianotti again.  I encouraged her to obtain the compression stocking and try to use it if she can.

## 2011-04-14 NOTE — Assessment & Plan Note (Signed)
Patient has significant AV block and we were planning to proceed with the pacemaker.  This has been put on hold because of the problem with her right leg.  She has received antibiotics for this problem.  She has not had any symptoms of CHF chest pain syncope or presyncope.  I am very hesitant to proceed with pacemaker until sure that she does not have an infectious risk.  I will follow her further and see her back in 6 weeks to review her situation.

## 2011-05-19 ENCOUNTER — Encounter: Payer: Self-pay | Admitting: Cardiology

## 2011-05-19 ENCOUNTER — Ambulatory Visit (INDEPENDENT_AMBULATORY_CARE_PROVIDER_SITE_OTHER): Payer: Medicare Other | Admitting: Cardiology

## 2011-05-19 DIAGNOSIS — L0390 Cellulitis, unspecified: Secondary | ICD-10-CM

## 2011-05-19 DIAGNOSIS — I4430 Unspecified atrioventricular block: Secondary | ICD-10-CM

## 2011-05-19 NOTE — Assessment & Plan Note (Signed)
The patient is improved.  She had superficial cellulitis with marked edema.  This is definitely improved.

## 2011-05-19 NOTE — Assessment & Plan Note (Signed)
The patient continues to have AV block.  She has not had any syncope.  I will arrange for early followup with Dr. Ladona Ridgel.  I want to be sure that he sees her in person and is made aware of the recent events before he proceeds with her pacemaker.

## 2011-05-19 NOTE — Patient Instructions (Signed)
We will call you with appointment for Dr Ladona Ridgel  Your physician recommends that you schedule a follow-up appointment in: 3 months with Dr Myrtis Ser

## 2011-05-19 NOTE — Progress Notes (Signed)
HPI Patient is seen for followup of bradycardia with 2:1 AV block.  A pacemaker had been arranged that the patient had significant leg swelling with some cellulitis.  Her right leg was wrapped one point with the boot.  Her legs look much better.  She still has some edema.  Poor overall weight is coming down. I believe that much of this is fluid. Allergies  Allergen Reactions  . Aspirin     REACTION: rash    Current Outpatient Prescriptions  Medication Sig Dispense Refill  . Cholecalciferol (VITAMIN D3) 1000 UNITS CAPS Take 1 capsule by mouth daily.        . cyclobenzaprine (FLEXERIL) 5 MG tablet Take 5 mg by mouth 3 (three) times daily as needed.       . diazepam (VALIUM) 5 MG tablet 5 mg. Every 4 hours as needed       . furosemide (LASIX) 80 MG tablet Take 80 mg by mouth 2 (two) times daily.        [name] HYDROcodone-acetaminophen (LORTAB) 10-500 MG per tablet Take 1 tablet by mouth every 4 (four) hours as needed.        . meloxicam (MOBIC) 15 MG tablet Take 15 mg by mouth daily.        . Multiple Vitamin (MULTIVITAMIN) capsule Take 1 capsule by mouth daily.        . potassium chloride SA (K-DUR,KLOR-CON) 20 MEQ tablet Take 20 mEq by mouth 2 (two) times daily.        [name] DISCONTD: Cholecalciferol (VITAMIN D) 1000 UNITS capsule Take 1,000 Units by mouth 2 (two) times daily.          History   Social History  . Marital Status: Widowed    Spouse Name: N/A    Number of Children: N/A  . Years of Education: N/A   Occupational History  . Retired    Social History Main Topics  . Smoking status: Never Smoker   . Smokeless tobacco: Not on file  . Alcohol Use: No  . Drug Use: No  . Sexually Active: Not on file   Other Topics Concern  . Not on file   Social History Narrative   Retired Widowed Tobacco Use - No. Alcohol Use - noRegular Exercise - noDrug Use - no    Family History  Problem Relation Age of Onset  . Cardiomyopathy Father   . Diabetes Father   . Cancer       son  .  Coronary artery disease      son    Past Medical History  Diagnosis Date  . Edema     E. EF 65-70%, echo, November 29, 2010  . Pulmonary hypertension      46 mmHg... echo... January, 201 to  . Cellulitis     Superficial cellulitis right lower leg   January 14, 2011  . AV block      2:1  January 14, 2011 / pacemaker plan when cellulitis resolved in length    Past Surgical History  Procedure Date  . Partial hysterectomy   . Hip arthroplasty     total    ROS  Patient denies fever, chills, headache, sweats, rash, change in vision, change in hearing, chest pain, cough, nausea vomiting, urinary symptoms.  All of the systems are reviewed and are negative.  PHYSICAL EXAM Patient is in a wheelchair today.  She is overweight.  She looks better than she has.  She is here with a family member.  Head is atraumatic.  Lungs are clear.  Respiratory effort is nonlabored.  Cardiac exam reveals S1 and S2.  No clicks or significant murmurs.  The abdomen is soft.  There is mild peripheral edema. Filed Vitals:   05/19/11 1459  BP: 156/50  Pulse: 49  Height: 4\' 10"  (1.473 m)  Weight: 162 lb (73.483 kg)    EKG EKG is done today and reveals 2:1 AV block.  ASSESSMENT & PLAN

## 2011-05-23 ENCOUNTER — Telehealth: Payer: Self-pay | Admitting: Cardiology

## 2011-05-23 NOTE — Telephone Encounter (Signed)
Left message to call back  

## 2011-05-23 NOTE — Telephone Encounter (Signed)
Pt calling re info on a procedure her mother is to have

## 2011-05-24 NOTE — Telephone Encounter (Signed)
[name] aware pt's appt w/Dr Ladona Ridgel is 7/17 at 12pm

## 2011-06-01 ENCOUNTER — Encounter: Payer: Self-pay | Admitting: Internal Medicine

## 2011-06-07 ENCOUNTER — Ambulatory Visit (INDEPENDENT_AMBULATORY_CARE_PROVIDER_SITE_OTHER): Payer: Medicare Other | Admitting: Internal Medicine

## 2011-06-07 ENCOUNTER — Encounter: Payer: Self-pay | Admitting: Internal Medicine

## 2011-06-07 DIAGNOSIS — L0390 Cellulitis, unspecified: Secondary | ICD-10-CM

## 2011-06-07 DIAGNOSIS — I4430 Unspecified atrioventricular block: Secondary | ICD-10-CM

## 2011-06-07 DIAGNOSIS — L0291 Cutaneous abscess, unspecified: Secondary | ICD-10-CM

## 2011-06-07 NOTE — Assessment & Plan Note (Signed)
The patient has had 2-1 heart block for several months. It is difficult to know whether she is symptomatic or not. My suspicion is that she is mildly symptomatic. She has not had syncope and does not have spells consistent with prolonged pauses. While permanent pacemaker insertion would be strongly considered, the patient's comorbidities and ongoing venous stasis and propensity for lower extremity cellulitis make insertion of a permanent pacemaker worrisome. Today I spent a considerable amount of time discussing the risks and benefits of placing a pacemaker as well as not placing a pacemaker. I have recommended that we undergo a period of close but watchful waiting. If she would develop syncope or near-syncope, I think pacemaker insertion at that point would be warranted. She is currently able to get around with some assistance. If her ability to ambulate because of weakness or shortness of breath were to worsen then I would also consider pacing. She has significant risk of infection from pacemaker insertion because of her venous stasis. I'll discuss all the above with Dr. Myrtis Ser who is her primary cardiologist.

## 2011-06-07 NOTE — Patient Instructions (Addendum)
Your physician recommends that you schedule a follow-up appointment as needed  Keep appointment with Dr Myrtis Ser

## 2011-06-07 NOTE — Progress Notes (Signed)
HPI [name] returns today for followup. She has a h/o 2:1 heart block and I had seen her several months ago and had recommended permanent pacemaker insertion. Prior to the procedure, she developed cellulitis in her lower extremities. She has chronic venous stasis. She has been treated with antibiotic therapy. In the interim, she has had no syncope or near syncope. She is able to walk. She does have fatigue and weakness but is unclear whether this is related to her bradycardia or whether it was related to her multiple comorbidities and advanced age. The patient has had no fevers or chills. She does have ongoing swelling in her lower extremities right greater than left. She notes that there is warmth predominant in the right leg. No other symptoms today. Allergies  Allergen Reactions  . Aspirin     REACTION: rash     Current Outpatient Prescriptions  Medication Sig Dispense Refill  . Cholecalciferol (VITAMIN D3) 1000 UNITS CAPS Take 1 capsule by mouth daily.        . cyclobenzaprine (FLEXERIL) 5 MG tablet Take 10 mg by mouth 2 (two) times daily as needed.       . diazepam (VALIUM) 5 MG tablet 5 mg. Every 4 hours as needed       . furosemide (LASIX) 80 MG tablet Take 80 mg by mouth 2 (two) times daily.        [name] HYDROcodone-acetaminophen (LORTAB) 10-500 MG per tablet Take 1 tablet by mouth every 4 (four) hours as needed.        . meloxicam (MOBIC) 15 MG tablet Take 15 mg by mouth daily.        . Multiple Vitamin (MULTIVITAMIN) capsule Take 1 capsule by mouth daily.        . potassium chloride SA (K-DUR,KLOR-CON) 20 MEQ tablet Take 20 mEq by mouth 2 (two) times daily.           Past Medical History  Diagnosis Date  . Edema     E. EF 65-70%, echo, November 29, 2010  . Pulmonary hypertension      46 mmHg... echo... January, 201 to  . Cellulitis     Superficial cellulitis right lower leg   January 14, 2011  . AV block      2:1  January 14, 2011 / pacemaker plan when cellulitis resolved in  length    ROS:   All systems reviewed and negative except as noted in the HPI.   Past Surgical History  Procedure Date  . Partial hysterectomy   . Hip arthroplasty     total     Family History  Problem Relation Age of Onset  . Cardiomyopathy Father   . Diabetes Father   . Cancer       son  . Coronary artery disease      son     History   Social History  . Marital Status: Widowed    Spouse Name: N/A    Number of Children: N/A  . Years of Education: N/A   Occupational History  . Retired    Social History Main Topics  . Smoking status: Never Smoker   . Smokeless tobacco: Not on file  . Alcohol Use: No  . Drug Use: No  . Sexually Active: Not on file   Other Topics Concern  . Not on file   Social History Narrative   Retired Widowed Tobacco Use - No. Alcohol Use - noRegular Exercise - noDrug Use - no  BP 183/51  Ht 5' (1.524 m)  Wt 166 lb (75.297 kg)  BMI 32.42 kg/m2  Physical Exam:  Elderly appearing NAD HEENT: Unremarkable Neck:  No JVD, no thyromegally Lymphatics:  No adenopathy Back:  No CVA tenderness Lungs:  Clear with no wheezes or rhonchi HEART:  Regular bradycardia, no murmurs, no rubs, no clicks Abd:  soft, positive bowel sounds, no organomegally, no rebound, no guarding Ext:  2 plus pulses, 2+ edema, no cyanosis, no clubbing. Right leg was slightly erythematous and warm. Skin:  No rashes no nodules Neuro:  CN II through XII intact, motor grossly intact   Assess/Plan:

## 2011-06-07 NOTE — Assessment & Plan Note (Signed)
She has minimal erythema and swelling in her right leg today. Previously she had active cellulitis. I have not recommended additional antibiotic therapy at this time though this would be a consideration if her symptoms or physical findings were to worsen.

## 2011-06-08 ENCOUNTER — Telehealth: Payer: Self-pay | Admitting: Cardiology

## 2011-06-08 NOTE — Telephone Encounter (Signed)
Daughter called and stated pt saw Dr. Ladona Ridgel yesterday.  She wanted to know if she there is any homecare assistance that she will qualify for.

## 2011-06-08 NOTE — Telephone Encounter (Signed)
Spoke w/pt's daughter is asking about a personal aide for pt to help her with household chores advised to call pcp she is agreeable

## 2011-06-30 ENCOUNTER — Telehealth: Payer: Self-pay | Admitting: Internal Medicine

## 2011-06-30 NOTE — Telephone Encounter (Signed)
Patient's daughter called for clarification on Dr. Ladona Ridgel last office visit because she said daughter was not in with patient during the visit. RN went over Dr. Lubertha Basque  office visit note with her. Daughter verbalized understanding.

## 2011-06-30 NOTE — Telephone Encounter (Signed)
Per pt daughter call, pt daughter wanting some clarification in reference to pt last report pt got. Please return pt call to advise/discuss.

## 2011-07-26 ENCOUNTER — Telehealth: Payer: Self-pay | Admitting: Cardiology

## 2011-07-26 NOTE — Telephone Encounter (Signed)
Returned daughter, [name]'s, call.  Procedure date unknown.  Pt to be evaluated by dentist tomorrow, 07/27/11.

## 2011-07-26 NOTE — Telephone Encounter (Signed)
Patient daughter calling back to speak with Eunice Blase

## 2011-07-26 NOTE — Telephone Encounter (Signed)
N/A.  LMTC. 

## 2011-07-26 NOTE — Telephone Encounter (Signed)
Per Andrey Campanile pt need medical clearance for tooth execration . Schedule on 9/5. Fax #  [phone].

## 2011-07-26 NOTE — Telephone Encounter (Signed)
Pt broke a tooth and is requesting an extraction tomorrow.  Mandy, from the dental office, wants an ok for extraction of tooth from a cardiac standpoint.  She also wants to know if pt needs premedication and if there are any precautions that need to be taken.

## 2011-07-28 NOTE — Telephone Encounter (Signed)
Per Mandy returning call back to nurse.  

## 2011-07-28 NOTE — Telephone Encounter (Signed)
Mandy, from Dr [facility] dental office was notified that tooth extraction is ok from Dr Henrietta Hoover standpoint.  No premedication necessary and no extra precautions need to be taken per Dr Myrtis Ser.

## 2011-08-12 ENCOUNTER — Encounter: Payer: Self-pay | Admitting: Cardiology

## 2011-08-15 ENCOUNTER — Encounter: Payer: Self-pay | Admitting: Cardiology

## 2011-08-15 ENCOUNTER — Ambulatory Visit (INDEPENDENT_AMBULATORY_CARE_PROVIDER_SITE_OTHER): Payer: Medicare Other | Admitting: Cardiology

## 2011-08-15 DIAGNOSIS — L0390 Cellulitis, unspecified: Secondary | ICD-10-CM

## 2011-08-15 DIAGNOSIS — L0291 Cutaneous abscess, unspecified: Secondary | ICD-10-CM

## 2011-08-15 DIAGNOSIS — I4430 Unspecified atrioventricular block: Secondary | ICD-10-CM

## 2011-08-15 DIAGNOSIS — R609 Edema, unspecified: Secondary | ICD-10-CM

## 2011-08-15 MED ORDER — METOLAZONE 2.5 MG PO TABS: ORAL_TABLET | ORAL | Status: DC

## 2011-08-15 NOTE — Progress Notes (Signed)
HPI Patient is seen today to followup edema in AV block.  She's also seen for followup cellulitis.  I've been following the patient since February, 2012 with significant edema.  We had increased her diuretic up to 80 b.i.d.  There was some improvement.  She has had some problems with cellulitis in her leg.  This has kept [place] from proceeding with a pacemaker that she needs.  There is a careful followup evaluation by Dr. Ladona Ridgel.  Plan for now is continue watch her very carefully.  Today I had a very tearful discussion with the patient and her son about her total salt and fluid intake.  She does seem to watch her salt intake.  Her total fluid intake is not excessive.  Her renal function has been stable. Allergies  Allergen Reactions  . Aspirin     REACTION: rash    Current Outpatient Prescriptions  Medication Sig Dispense Refill  . Cholecalciferol (VITAMIN D3) 1000 UNITS CAPS Take 1 capsule by mouth daily.        . cyclobenzaprine (FLEXERIL) 5 MG tablet Take 10 mg by mouth 2 (two) times daily as needed.       . diazepam (VALIUM) 5 MG tablet 5 mg. Every 4 hours as needed       . furosemide (LASIX) 80 MG tablet Take 80 mg by mouth 2 (two) times daily.        [name] HYDROcodone-acetaminophen (LORTAB) 10-500 MG per tablet Take 1 tablet by mouth every 4 (four) hours as needed.        . meloxicam (MOBIC) 15 MG tablet Take 15 mg by mouth daily.        . Multiple Vitamin (MULTIVITAMIN) capsule Take 1 capsule by mouth daily.        . penicillin v potassium (VEETID) 500 MG tablet Take 500 mg by mouth every 8 (eight) hours.        . potassium chloride SA (K-DUR,KLOR-CON) 20 MEQ tablet Take 20 mEq by mouth 2 (two) times daily.          History   Social History  . Marital Status: Widowed    Spouse Name: N/A    Number of Children: N/A  . Years of Education: N/A   Occupational History  . Retired    Social History Main Topics  . Smoking status: Never Smoker   . Smokeless tobacco: Not on file  . Alcohol  Use: No  . Drug Use: No  . Sexually Active: Not on file   Other Topics Concern  . Not on file   Social History Narrative   Retired Widowed Tobacco Use - No. Alcohol Use - noRegular Exercise - noDrug Use - no    Family History  Problem Relation Age of Onset  . Cardiomyopathy Father   . Diabetes Father   . Cancer       son  . Coronary artery disease      son    Past Medical History  Diagnosis Date  . Edema     . EF 65-70%, echo, November 29, 2010  . Pulmonary hypertension      46 mmHg... echo... January, 201 to  . Cellulitis     Superficial cellulitis right lower leg   January 14, 2011  . AV block      2:1  January 14, 2011 / pacemaker plan when cellulitis resolved in length  . Bradycardia     21 AV block    Past Surgical History  Procedure  Date  . Partial hysterectomy   . Hip arthroplasty     total    ROS  Patient denies fever, chills, headache, sweats, rash, change in vision, change in hearing, chest pain, cough, nausea vomiting, urinary symptoms.  All other systems are reviewed and are negative.  PHYSICAL EXAM Patient is in a wheelchair today.  She is overweight.  She does have significant edema.  She is oriented to person time and place.  Affect is normal.  She is here with her son.  Head is atraumatic.  There is no xanthelasma.  There is no jugular venous distention.  Lungs are clear.  Respiratory effort is nonlabored.  Cardiac exam reveals S1 and S2.  No clicks or significant murmurs.  The abdomen is soft.  Patient has 2+ edema bilaterally.  There is some warmth to the right lower extremity. Filed Vitals:   08/15/11 1409  BP: 130/40  Pulse: 50  Height: 5' (1.524 m)  Weight: 176 lb 12.8 oz (80.196 kg)    EKG is not done today.  ASSESSMENT & PLAN

## 2011-08-15 NOTE — Assessment & Plan Note (Signed)
Patient has significant ongoing edema.  I have talked with her and her son about watching her salt and fluid intake.  Also talked about keeping her feet elevated.  She has had good renal function in the past.  Chemistry will be checked today.  In addition I will add 2.5 mg of Zaroxolyn to her morning dose of Lasix.  She is instructed to take them at the same time.  I want her to call us back after 3-5 days of this medication to let us know about her overall fluid status.  I will then decide if she'll remain on the drug every day or not.  We will also arrange for followup chemistry.

## 2011-08-15 NOTE — Assessment & Plan Note (Signed)
We know that it would be optimal for the patient to have a pacemaker.  However there are significant risks with the recurring cellulitis and she has in her leg.  Also it appears that she has a dental problem at this time it probably will require antibiotics.  We will watch her carefully.  She's not had syncope or presyncope.

## 2011-08-15 NOTE — Patient Instructions (Signed)
Your physician recommends that you schedule a follow-up appointment in: 4 weeks with Dr. Myrtis Ser. New medication Metolazone 2.5 mg . Take one tablet by mouth in the morning the same time with Lasix 80 mg.  Your physician recommends that you return for lab work in: BMET today and in 10 days. Patient to  Watch salt and fluid intake. Call the office 3 to 5 days to see how she is doing.

## 2011-08-15 NOTE — Assessment & Plan Note (Signed)
Patient does have some mild warmth to the right lower extremity.  I am hopeful that if we can improve her edema that she will have less problems with cellulitis.

## 2011-08-16 LAB — BASIC METABOLIC PANEL
CO2: 25 mEq/L (ref 19–32)
Calcium: 9.4 mg/dL (ref 8.4–10.5)
GFR: 69.81 mL/min (ref >60.00)
Potassium: 5.3 mEq/L — ABNORMAL HIGH (ref 3.5–5.1)
Sodium: 142 mEq/L (ref 135–145)

## 2011-08-22 ENCOUNTER — Telehealth: Payer: Self-pay | Admitting: Cardiology

## 2011-08-22 NOTE — Telephone Encounter (Signed)
Per daughter - states pt is complaining of weakness for the past week and they think its because her potassium is too high.  Explained to daughter that her potassium being at 5.3 would not be making her feel weak.  The pt was started on Zaroxolyn at last office visit and this maybe contributing to her feeling of weakness.   Pt will be having a repeat BMP on Thursday.  Pt is also having a tooth extracted on Thursday afternoon.  Daughter had no further questions but will call back if further needs after speaking with the pt.

## 2011-08-22 NOTE — Telephone Encounter (Signed)
Pt daughter needs to talk to someone to see if her mom can get her tooth pulled Friday and to give an update

## 2011-08-24 ENCOUNTER — Telehealth: Payer: Self-pay | Admitting: Cardiology

## 2011-08-24 NOTE — Telephone Encounter (Signed)
I talked with pt. 

## 2011-08-24 NOTE — Telephone Encounter (Signed)
The patient has been taking the Zaroxolyn every day,  let's plan to continue that on July see the lab from tomorrow.

## 2011-08-24 NOTE — Telephone Encounter (Signed)
Pt calling to speak w/ Dr. Myrtis Ser nurse.   Pt daughter called Monday. The pills that were given to pt are working pretty good. Tomorrow pt is going to Dr. Gwendlyn Deutscher to get a tooth pulled out and pt is suppose to come back on Friday for [place] to draw blood. Pt wanted to find out why she needed to come up here to get blood drawn. Pt also wants to know if she needs to continue taking fluid pills, pt said swelling is starting to go down. Please return pt call to discuss further.   It was difficult to understand what message pt wanted to relay, pt continued repeating herself. Please return pt to clarify.

## 2011-08-24 NOTE — Telephone Encounter (Signed)
Pt also wanted to let Dr Myrtis Ser know that she was doing better --swelling is better and she is feeling better--since starting metolazone. I have rescheduled lab appt for pt.

## 2011-08-24 NOTE — Telephone Encounter (Signed)
I talked with pt. Pt requesting to reschedule lab from Friday to Thursday.

## 2011-08-25 ENCOUNTER — Other Ambulatory Visit: Payer: Self-pay | Admitting: *Deleted

## 2011-08-25 ENCOUNTER — Other Ambulatory Visit (INDEPENDENT_AMBULATORY_CARE_PROVIDER_SITE_OTHER): Payer: Medicare Other | Admitting: *Deleted

## 2011-08-25 DIAGNOSIS — E876 Hypokalemia: Secondary | ICD-10-CM

## 2011-08-25 DIAGNOSIS — R609 Edema, unspecified: Secondary | ICD-10-CM

## 2011-08-25 LAB — BASIC METABOLIC PANEL
BUN: 82 mg/dL — ABNORMAL HIGH (ref 6–23)
CO2: 30 mEq/L (ref 19–32)
Chloride: 96 mEq/L (ref 96–112)
Potassium: 2.7 mEq/L — CL (ref 3.5–5.1)

## 2011-08-26 ENCOUNTER — Other Ambulatory Visit: Payer: Medicare Other | Admitting: *Deleted

## 2011-08-26 NOTE — Telephone Encounter (Signed)
I spoke with [facility] and made her aware of the instructions on 08/25/11 lab work.  She was concerned because the pt told her there was something wrong with her kidneys.  [name] was concerned because she does not want the pt to end up on dialysis.  I made her aware that the Zaroxolyn can increase kidney function and that is why the medication was stopped. The pt was also given instructions to increase potassium and have labs repeated on 08/29/11.

## 2011-08-26 NOTE — Telephone Encounter (Signed)
Pt daughter calling wanting to check on pt condition. Pt told daughter something about her kidneys. Pt daughter would like to be informed as to what is going on. Please return daughter call to discuss further.

## 2011-08-29 ENCOUNTER — Other Ambulatory Visit (INDEPENDENT_AMBULATORY_CARE_PROVIDER_SITE_OTHER): Payer: Medicare Other | Admitting: *Deleted

## 2011-08-29 DIAGNOSIS — E876 Hypokalemia: Secondary | ICD-10-CM

## 2011-08-29 LAB — BASIC METABOLIC PANEL
BUN: 61 mg/dL — ABNORMAL HIGH (ref 6–23)
CO2: 31 mEq/L (ref 19–32)
Glucose, Bld: 122 mg/dL — ABNORMAL HIGH (ref 70–99)
Potassium: 2.9 mEq/L — ABNORMAL LOW (ref 3.5–5.1)
Sodium: 138 mEq/L (ref 135–145)

## 2011-08-30 ENCOUNTER — Encounter: Payer: Self-pay | Admitting: Cardiology

## 2011-08-30 ENCOUNTER — Telehealth: Payer: Self-pay | Admitting: Cardiology

## 2011-08-30 DIAGNOSIS — R9430 Abnormal result of cardiovascular function study, unspecified: Secondary | ICD-10-CM | POA: Insufficient documentation

## 2011-08-30 NOTE — Telephone Encounter (Signed)
Pt's daughter called about her potassium level please

## 2011-08-30 NOTE — Telephone Encounter (Signed)
Patient's daughter called concern about patient's labs. She would like to know if patient needs to go to the hospital. I let daughter know that Dr. Myrtis Ser would recommends it if he though pt needed to go to the hospital, but it seems that according the last lab result, present treatment is working. Daughter verbalized understanding, she will bring pt to the [facility] this Thursday.

## 2011-09-01 ENCOUNTER — Encounter: Payer: Self-pay | Admitting: Cardiology

## 2011-09-01 ENCOUNTER — Ambulatory Visit (INDEPENDENT_AMBULATORY_CARE_PROVIDER_SITE_OTHER): Payer: Medicare Other | Admitting: Cardiology

## 2011-09-01 DIAGNOSIS — R609 Edema, unspecified: Secondary | ICD-10-CM

## 2011-09-01 DIAGNOSIS — E876 Hypokalemia: Secondary | ICD-10-CM

## 2011-09-01 DIAGNOSIS — R011 Cardiac murmur, unspecified: Secondary | ICD-10-CM | POA: Insufficient documentation

## 2011-09-01 DIAGNOSIS — I4430 Unspecified atrioventricular block: Secondary | ICD-10-CM

## 2011-09-01 DIAGNOSIS — I509 Heart failure, unspecified: Secondary | ICD-10-CM

## 2011-09-01 LAB — BASIC METABOLIC PANEL
BUN: 36 mg/dL — ABNORMAL HIGH (ref 6–23)
Chloride: 101 mEq/L (ref 96–112)
Creatinine, Ser: 1 mg/dL (ref 0.4–1.2)
GFR: 71.47 mL/min (ref >60.00)
Potassium: 4.1 mEq/L (ref 3.5–5.1)

## 2011-09-01 NOTE — Assessment & Plan Note (Signed)
I am hopeful that we'll be able to keep her edema under control and prevent any further cellulitis.  She was then be stable for pacemaker placement.

## 2011-09-01 NOTE — Progress Notes (Signed)
HPI Patient is seen today to followup edema and hypokalemia and bradycardia.  I saw her last August 15, 2011.  She has AV block and we would like to proceed with a pacemaker.  However she is intermittently had some cellulitis in her legs related to marked edema.  When I saw her last Zaroxolyn was added and she had a marked diuresis at home.  Her edema definitely improved by report.  We followed her chemistry carefully and her potassium dropped and there was some increase in her BUN and creatinine.  Based on this for Zaroxolyn was put on hold and her potassium increased.  She returns today for early followup.  Her legs are significantly improved.  Chemistry will be checked today.  She has no evidence of cellulitis in her legs.  She feels fatigued but she is pleased that her leg swelling is so much improved.   Allergies  Allergen Reactions  . Aspirin     REACTION: rash    Current Outpatient Prescriptions  Medication Sig Dispense Refill  . Cholecalciferol (VITAMIN D3) 1000 UNITS CAPS Take 1 capsule by mouth daily.        . cyclobenzaprine (FLEXERIL) 5 MG tablet Take 10 mg by mouth 2 (two) times daily as needed.       . diazepam (VALIUM) 5 MG tablet 5 mg. Every 4 hours as needed       . furosemide (LASIX) 80 MG tablet Take 80 mg by mouth 2 (two) times daily.        [name] HYDROcodone-acetaminophen (LORTAB) 10-500 MG per tablet Take 1 tablet by mouth every 4 (four) hours as needed.        . meloxicam (MOBIC) 15 MG tablet Take 15 mg by mouth daily.        . Multiple Vitamin (MULTIVITAMIN) capsule Take 1 capsule by mouth daily.        . penicillin v potassium (VEETID) 500 MG tablet Take 500 mg by mouth every 8 (eight) hours.        . potassium chloride SA (K-DUR,KLOR-CON) 20 MEQ tablet Take 20 mEq by mouth 2 (two) times daily.          History   Social History  . Marital Status: Widowed    Spouse Name: N/A    Number of Children: N/A  . Years of Education: N/A   Occupational History  . Retired     Social History Main Topics  . Smoking status: Never Smoker   . Smokeless tobacco: Not on file  . Alcohol Use: No  . Drug Use: No  . Sexually Active: Not on file   Other Topics Concern  . Not on file   Social History Narrative   Retired Widowed Tobacco Use - No. Alcohol Use - noRegular Exercise - noDrug Use - no    Family History  Problem Relation Age of Onset  . Cardiomyopathy Father   . Diabetes Father   . Cancer       son  . Coronary artery disease      son    Past Medical History  Diagnosis Date  . Edema     . EF 65-70%, echo, November 29, 2010  . Pulmonary hypertension      46 mmHg... echo... January, 2012  . Cellulitis     Superficial cellulitis right lower leg   January 14, 2011  . AV block      2:1  January 14, 2011 / pacemaker plan when cellulitis resolved in  length  . Bradycardia     2:1  AV block  . Hypokalemia     October, 2011  . Ejection fraction     EF 65%, echo, January, 2012    Past Surgical History  Procedure Date  . Partial hysterectomy   . Hip arthroplasty     total    ROS  Patient denies fever, chills, headache, sweats, rash, change in vision, change in hearing, chest pain, cough, nausea vomiting, urinary symptoms.  All other systems are reviewed and are negative.  PHYSICAL EXAM Patient is here in a wheelchair today as always.  Her leg swelling is significantly improved.  She is oriented to person time and place.  Affect is normal.  Head is atraumatic.  There is no jugular venous distention.  Lungs reveal no rales.  The patient is significantly overweight.  Cardiac exam reveals an S1-S2.  There are no clicks.  There is a systolic murmur. Abdomen is soft.  There is 1+ edema in the right lower extremity.  There is only trace edema in her left lower extremity.  There no skin rashes. Filed Vitals:   09/01/11 1220  BP: 140/40  Pulse: 70  Height: 5' (1.524 m)  Weight: 166 lb (75.297 kg)    EKG is not done today.  ASSESSMENT & PLAN

## 2011-09-01 NOTE — Assessment & Plan Note (Signed)
Current he is greatly improved with the addition of Zaroxolyn.  Her weight on our scale is decreased from 176 to 166 pounds.  It would be extremely helpful to have her weight at home.  She is unable to do this on her own.  I believe that the key to keeping her stable will be home health helping with weights.  I will keep her off Zaroxolyn for now.  We will dose intermittently to help with her edema.

## 2011-09-01 NOTE — Patient Instructions (Signed)
Your physician recommends that you schedule a follow-up appointment in: 4 weeks with Dr. Myrtis Ser Md. Continue holding the Metolazone medication. You have been referred to Home health care for significant edema, CHF. Pt needs to be weigh 2 times a week. Your physician recommends that you return for lab work in: BMET today

## 2011-09-01 NOTE — Assessment & Plan Note (Signed)
Her potassium became low with Zaroxolyn.  Chemistry will be checked again today and we will adjust her dose as further.

## 2011-09-02 ENCOUNTER — Other Ambulatory Visit: Payer: Self-pay | Admitting: Cardiology

## 2011-09-02 ENCOUNTER — Other Ambulatory Visit: Payer: Self-pay | Admitting: *Deleted

## 2011-09-02 NOTE — Telephone Encounter (Signed)
Please call about test results and has questions about home health

## 2011-09-03 DIAGNOSIS — I2789 Other specified pulmonary heart diseases: Secondary | ICD-10-CM

## 2011-09-03 DIAGNOSIS — I509 Heart failure, unspecified: Secondary | ICD-10-CM

## 2011-09-05 ENCOUNTER — Telehealth: Payer: Self-pay | Admitting: Cardiology

## 2011-09-05 NOTE — Telephone Encounter (Signed)
Angelique Blonder calling stating that pt has had recent low potassium levels and wanted to know if MD wanted any labs drawn on pt. Please return call to discuss further.

## 2011-09-05 NOTE — Telephone Encounter (Signed)
Patient family member Angelique Blonder  was made aware that pt's last K+ level was 4.1. Denise verbalized understanding.

## 2011-09-08 ENCOUNTER — Telehealth: Payer: Self-pay | Admitting: Cardiology

## 2011-09-08 NOTE — Telephone Encounter (Signed)
Pt's daughter called about appt on 1022 does she need to come to this appt when she has done made for 1105 also please call her back

## 2011-09-12 ENCOUNTER — Ambulatory Visit: Payer: Medicare Other | Admitting: Cardiology

## 2011-09-26 ENCOUNTER — Encounter: Payer: Self-pay | Admitting: Cardiology

## 2011-09-26 ENCOUNTER — Ambulatory Visit (INDEPENDENT_AMBULATORY_CARE_PROVIDER_SITE_OTHER): Payer: Medicare Other | Admitting: Cardiology

## 2011-09-26 DIAGNOSIS — I4430 Unspecified atrioventricular block: Secondary | ICD-10-CM

## 2011-09-26 DIAGNOSIS — R609 Edema, unspecified: Secondary | ICD-10-CM

## 2011-09-26 DIAGNOSIS — E876 Hypokalemia: Secondary | ICD-10-CM

## 2011-09-26 LAB — BASIC METABOLIC PANEL
Calcium: 9.3 mg/dL (ref 8.4–10.5)
GFR: 50.07 mL/min — ABNORMAL LOW (ref >60.00)
Potassium: 4.9 mEq/L (ref 3.5–5.1)
Sodium: 141 mEq/L (ref 135–145)

## 2011-09-26 NOTE — Assessment & Plan Note (Signed)
Her potassium was mildly decreased recently and her potassium dose was increased.  She'll have chemistry done today.

## 2011-09-26 NOTE — Assessment & Plan Note (Signed)
The patient ambulates a small amount with a walker.  She also uses a wheelchair.  Because she has only minimal exertion she remained stable with her bradycardia.  However I feel she still needs pacemaker to be completely stabilized.  If we can keep her leg stable over the next period of time I will arrange for her pacemaker to be placed.

## 2011-09-26 NOTE — Patient Instructions (Addendum)
Your physician recommends that you return for lab work in:  Today Designer, [employer]ellery)  Your physician recommends that you schedule a follow-up appointment in: 4 weeks  Rx given stating a lift chair is medically necessary  Take one tablet of Zaroxolyn tomorrow am  [facility] at [phone]  Shanda Bumps)

## 2011-09-26 NOTE — Progress Notes (Signed)
HPI Patient is seen today for followup fluid overload and bradycardia.  She is actually doing very well.  We had added Zaroxolyn she had a marked response.  The edema decreased in her legs and her cellulitis improved completely.  In the office on October 11 her weight was 166 pounds.  Today it is 175 pounds.  I'm having difficulty having her weight at home.  She tells me that at times home health has come out without a scale.  We absolutely need her weight at least once per week.  We have to show that we can keep her weight stable and her legs without cellulitis before she can have a pacemaker Allergies  Allergen Reactions  . Aspirin     REACTION: rash    Current Outpatient Prescriptions  Medication Sig Dispense Refill  . Cholecalciferol (VITAMIN D3) 1000 UNITS CAPS Take 1 capsule by mouth daily.        . cyclobenzaprine (FLEXERIL) 5 MG tablet Take 10 mg by mouth 2 (two) times daily as needed.       . diazepam (VALIUM) 5 MG tablet 5 mg. Every 4 hours as needed       . furosemide (LASIX) 80 MG tablet TAKE ONE TABLET BY MOUTH TWICE DAILY  60 tablet  6  . HYDROcodone-acetaminophen (LORTAB) 10-500 MG per tablet Take 1 tablet by mouth every 4 (four) hours as needed.        . meloxicam (MOBIC) 15 MG tablet Take 15 mg by mouth daily.        . Multiple Vitamin (MULTIVITAMIN) capsule Take 1 capsule by mouth daily.        . potassium chloride SA (K-DUR,KLOR-CON) 20 MEQ tablet Take 20 mEq by mouth 2 (two) times daily.          History   Social History  . Marital Status: Widowed    Spouse Name: N/A    Number of Children: N/A  . Years of Education: N/A   Occupational History  . Retired    Social History Main Topics  . Smoking status: Never Smoker   . Smokeless tobacco: Not on file  . Alcohol Use: No  . Drug Use: No  . Sexually Active: Not on file   Other Topics Concern  . Not on file   Social History Narrative   Retired Widowed Tobacco Use - No. Alcohol Use - noRegular Exercise - noDrug  Use - no    Family History  Problem Relation Age of Onset  . Cardiomyopathy Father   . Diabetes Father   . Cancer       son  . Coronary artery disease      son    Past Medical History  Diagnosis Date  . Edema     . EF 65-70%, echo, November 29, 2010  . Pulmonary hypertension      46 mmHg... echo... January, 2012  . Cellulitis     Superficial cellulitis right lower leg   January 14, 2011  . AV block      2:1  January 14, 2011 / pacemaker plan when cellulitis resolved in length  . Bradycardia     2:1  AV block  . Hypokalemia     October, 2012  . Ejection fraction     EF 65%, echo, January, 2012  . Murmur     No significant valvular disease by echo, January, 2012    Past Surgical History  Procedure Date  . Partial hysterectomy   .  Hip arthroplasty     total    ROS  Patient denies fever, chills, headache, sweats, rash, change in vision, change in hearing, chest pain, cough, nausea vomiting, urinary symptoms.  All other systems are reviewed and are negative.  PHYSICAL EXAM She really looks good today.  She is here with a family member.  She is oriented to person time and place.  Affect is normal.  There is no jugular venous distention.  Lungs are clear.  Respiratory effort is nonlabored.  Cardiac exam reveals S1-S2.  No clicks or significant murmurs.  She does have mild edema in the right leg.  Her left leg has stabilized and really looks good. Filed Vitals:   09/26/11 1008  BP: 150/62  Pulse: 46  Resp: 18  Height: 5' (1.524 m)  Weight: 175 lb 1.9 oz (79.434 kg)  SpO2: 90%    EKG is not done today. ASSESSMENT & PLAN

## 2011-09-26 NOTE — Assessment & Plan Note (Signed)
Her edema is greatly improved.  However I believe that she is mildly total body volume overload.  I will have her take one dose of Zaroxolyn with her morning Lasix tomorrow morning.  We will try to find a way to have her weighed once weekly.  His weight needs to be called in to [place].

## 2011-10-04 ENCOUNTER — Telehealth: Payer: Self-pay | Admitting: Cardiology

## 2011-10-04 NOTE — Telephone Encounter (Signed)
Daughter states that her weight has been ranging from 165.8-167.8 the fast few days and her heart rate has been ranging from 40-45.

## 2011-10-04 NOTE — Telephone Encounter (Signed)
New problem pt's daughter called about fluid levels and her heart rate. She is in [place] please call

## 2011-10-07 ENCOUNTER — Telehealth: Payer: Self-pay

## 2011-10-07 NOTE — Telephone Encounter (Signed)
Opened in error

## 2011-10-07 NOTE — Telephone Encounter (Signed)
Telehealth was started on 09/27/11.  Weights from them are as follows:  11/6=167.6 11/7=166.8 11/8=165.2 11/9=167.0 11/10=165.8 11/11=165.8 11/12=168.2 11/13=169.8 11/14=169.8 11/15=170.1  Home Health weight was started on 11/14 and weight was 169.6 on their scale Pt states she feels better than she did when she saw Dr Myrtis Ser.  She also states her swelling is "a little better" than it was at her last appt. Pt was told to take one dose of zaroxolyn this am along with her lasix.  She was also told to call me if her weight is above 168 any morning for further instructions.  Per Dr Myrtis Ser, pt is to call me at the clinic with weight over 168#.  She is to be instructed to take one dose of zaroxolyn if weight is greater than 168.  The dose of zaroxolyn is to be taken with am dose of lasix.

## 2011-10-07 NOTE — Telephone Encounter (Signed)
Call me in the [facility] office Friday, November 16 to discuss.

## 2011-10-10 ENCOUNTER — Telehealth: Payer: Self-pay | Admitting: Cardiology

## 2011-10-10 NOTE — Telephone Encounter (Signed)
New message: pt is calling back after starting a new medication (Lasix)  She was to call back and talk to nurse.  Please call her back to discuss.

## 2011-10-10 NOTE — Telephone Encounter (Signed)
Weight today was 171.6.  It was 169.4 yesterday and 168.2 on Sat.

## 2011-10-14 NOTE — Telephone Encounter (Signed)
Pt was called to see how her weight is doing.  She states it is 163.8 which is down.

## 2011-10-24 ENCOUNTER — Ambulatory Visit (INDEPENDENT_AMBULATORY_CARE_PROVIDER_SITE_OTHER): Payer: Medicare Other | Admitting: Cardiology

## 2011-10-24 ENCOUNTER — Encounter: Payer: Self-pay | Admitting: Cardiology

## 2011-10-24 DIAGNOSIS — I4430 Unspecified atrioventricular block: Secondary | ICD-10-CM

## 2011-10-24 DIAGNOSIS — R609 Edema, unspecified: Secondary | ICD-10-CM

## 2011-10-24 DIAGNOSIS — R9430 Abnormal result of cardiovascular function study, unspecified: Secondary | ICD-10-CM

## 2011-10-24 DIAGNOSIS — IMO0002 Reserved for concepts with insufficient information to code with codable children: Secondary | ICD-10-CM

## 2011-10-24 DIAGNOSIS — L0390 Cellulitis, unspecified: Secondary | ICD-10-CM

## 2011-10-24 DIAGNOSIS — R0989 Other specified symptoms and signs involving the circulatory and respiratory systems: Secondary | ICD-10-CM

## 2011-10-24 LAB — BASIC METABOLIC PANEL
CO2: 27 mEq/L (ref 19–32)
Chloride: 102 mEq/L (ref 96–112)
Potassium: 5.2 mEq/L — ABNORMAL HIGH (ref 3.5–5.1)

## 2011-10-24 NOTE — Patient Instructions (Addendum)
Your physician has recommended you make the following change in your medication: Start Zaroxolyn  twice per week on Tuesday and Friday with your morning dose of lasix  Your physician recommends that you return for lab work in: today  (BMET)  Your physician recommends that you schedule a follow-up appointment in: November 10, 2011 at 9:15am

## 2011-10-24 NOTE — Assessment & Plan Note (Signed)
It has been difficult to finely tune her volume management at home.  Today I plan to start Zaroxolyn on 2 days of the week.  I will see her back in person to be sure that she is stable with this.  Chemistry will be checked today.

## 2011-10-24 NOTE — Progress Notes (Signed)
HPI   Patient returns today for followup of her fluid status and edema.  She continues to have 2-1 AV block.  I've been working very hard to stabilize her volume status to be sure that she does not have any edema related cellulitis.  I want to perceive with her pacemaker placement.  I do believe that her volume can be controlled and that she will soon be stable for her pacemaker.  Unfortunately it is very difficult to get accurate weights and to manage her fluid status at home.  Allergies  Allergen Reactions  . Aspirin     REACTION: rash    Current Outpatient Prescriptions  Medication Sig Dispense Refill  . Cholecalciferol (VITAMIN D3) 1000 UNITS CAPS Take 1 capsule by mouth daily.        . cyclobenzaprine (FLEXERIL) 5 MG tablet Take 10 mg by mouth 2 (two) times daily as needed.       . diazepam (VALIUM) 5 MG tablet 5 mg. Every 4 hours as needed       . furosemide (LASIX) 80 MG tablet TAKE ONE TABLET BY MOUTH TWICE DAILY  60 tablet  6  . HYDROcodone-acetaminophen (LORTAB) 10-500 MG per tablet Take 1 tablet by mouth every 4 (four) hours as needed.        . meloxicam (MOBIC) 15 MG tablet Take 15 mg by mouth daily.        . Multiple Vitamin (MULTIVITAMIN) capsule Take 1 capsule by mouth daily.        . potassium chloride SA (K-DUR,KLOR-CON) 20 MEQ tablet Take 20 mEq by mouth 2 (two) times daily.          History   Social History  . Marital Status: Widowed    Spouse Name: N/A    Number of Children: N/A  . Years of Education: N/A   Occupational History  . Retired    Social History Main Topics  . Smoking status: Never Smoker   . Smokeless tobacco: Not on file  . Alcohol Use: No  . Drug Use: No  . Sexually Active: Not on file   Other Topics Concern  . Not on file   Social History Narrative   Retired Widowed Tobacco Use - No. Alcohol Use - noRegular Exercise - noDrug Use - no    Family History  Problem Relation Age of Onset  . Cardiomyopathy Father   . Diabetes Father   .  Cancer       son  . Coronary artery disease      son    Past Medical History  Diagnosis Date  . Edema     . EF 65-70%, echo, November 29, 2010  . Pulmonary hypertension      46 mmHg... echo... January, 2012  . Cellulitis     Superficial cellulitis right lower leg   January 14, 2011  . AV block      2:1  January 14, 2011 / pacemaker plan when cellulitis resolved in length  . Bradycardia     2:1  AV block  . Hypokalemia     October, 2012  . Ejection fraction     EF 65%, echo, January, 2012  . Murmur     No significant valvular disease by echo, January, 2012    Past Surgical History  Procedure Date  . Partial hysterectomy   . Hip arthroplasty     total    ROS    Patient denies fever, chills, headache, sweats, rash, change in  vision, change in hearing, chest pain, cough, nausea vomiting, urinary symptoms.  All of the systems are reviewed and are negative.  PHYSICAL EXAM   Patient is here with family.  She is in a wheelchair.  She is comfortable.  She ambulates at home with a walker some of the time.  There is no jugular venous distention.  Lungs are clear.  Respiratory effort is nonlabored.  Cardiac exam reveals S1 and S2.  No clicks or significant murmurs.  The abdomen is soft.  Patient has 1+ edema in her left leg and slightly more in her right leg.  Her weight of 175 is similar today to the weight here in the office at the time of her last visit.  I'm not sure that her home weights are reliable in any way.  There is very slight erythema of the right lower extremity.  Filed Vitals:   10/24/11 1037  BP: 126/54  Pulse: 45  Height: 5' (1.524 m)  Weight: 175 lb (79.379 kg)    EKG  EKG is done today and reviewed by me.  She has persistent 2-1 AV block. ASSESSMENT & PLAN

## 2011-10-24 NOTE — Assessment & Plan Note (Signed)
The patient has normal systolic function.  The issue is that of diastolic dysfunction.

## 2011-10-24 NOTE — Assessment & Plan Note (Signed)
When the patient has significant edema she gets very mild or facial reddening.  When we can keep her volume control this is not a problem.

## 2011-10-24 NOTE — Assessment & Plan Note (Signed)
The patient has not had syncope or presyncope.  She continues to have 2-1 AV block with a ventricular rate of approximately 45.  I think that her overall cardiac status may be improved when she has a pacemaker.  I feel comfortable that infection is not in significant ongoing problem in the form of cellulitis in her legs.  I will talk with Dr. Ladona Ridgel about trying to consider proceeding with her pacemaker placement over the next many weeks.

## 2011-10-27 ENCOUNTER — Telehealth: Payer: Self-pay

## 2011-10-27 NOTE — Telephone Encounter (Signed)
Opened in error

## 2011-11-10 ENCOUNTER — Ambulatory Visit: Payer: Medicare Other | Admitting: Cardiology

## 2011-11-10 ENCOUNTER — Encounter: Payer: Self-pay | Admitting: Cardiology

## 2011-11-10 ENCOUNTER — Ambulatory Visit (INDEPENDENT_AMBULATORY_CARE_PROVIDER_SITE_OTHER): Payer: Medicare Other | Admitting: Cardiology

## 2011-11-10 DIAGNOSIS — I4430 Unspecified atrioventricular block: Secondary | ICD-10-CM

## 2011-11-10 DIAGNOSIS — L0390 Cellulitis, unspecified: Secondary | ICD-10-CM

## 2011-11-10 DIAGNOSIS — R609 Edema, unspecified: Secondary | ICD-10-CM

## 2011-11-10 NOTE — Patient Instructions (Addendum)
You have an appt with Dr Ladona Ridgel on 11/16/2011 at 4:15 for a consult.  Your physician recommends that you return for lab work in: today (bmet)  Your physician recommends that you schedule a follow-up appointment in: 3 months.

## 2011-11-10 NOTE — Progress Notes (Signed)
HPI  The patient is seen today to followup edema. When I saw her last on October 24, 2011 we adjusted her meds to include Zaroxolyn 2 doses per week. She is diuresis from 175-168 pounds. She looks good. Her renal function was stable at her last visit. Chemistry will be checked again today. She has only mild swelling in her right leg today. She is stable and I'm hopeful that she will receive a pacemaker soon.  Allergies  Allergen Reactions  . Aspirin     REACTION: rash    Current Outpatient Prescriptions  Medication Sig Dispense Refill  . Cholecalciferol (VITAMIN D3) 1000 UNITS CAPS Take 1 capsule by mouth daily.        . cyclobenzaprine (FLEXERIL) 5 MG tablet Take 10 mg by mouth 2 (two) times daily as needed.       . diazepam (VALIUM) 5 MG tablet 5 mg. Every 4 hours as needed       . furosemide (LASIX) 80 MG tablet TAKE ONE TABLET BY MOUTH TWICE DAILY  60 tablet  6  . HYDROcodone-acetaminophen (LORTAB) 10-500 MG per tablet Take 1 tablet by mouth every 4 (four) hours as needed.        . meloxicam (MOBIC) 15 MG tablet Take 15 mg by mouth daily.        . Multiple Vitamin (MULTIVITAMIN) capsule Take 1 capsule by mouth daily.        . potassium chloride SA (K-DUR,KLOR-CON) 20 MEQ tablet Take 20 mEq by mouth 2 (two) times daily.          History   Social History  . Marital Status: Widowed    Spouse Name: N/A    Number of Children: N/A  . Years of Education: N/A   Occupational History  . Retired    Social History Main Topics  . Smoking status: Never Smoker   . Smokeless tobacco: Not on file  . Alcohol Use: No  . Drug Use: No  . Sexually Active: Not on file   Other Topics Concern  . Not on file   Social History Narrative   Retired Widowed Tobacco Use - No. Alcohol Use - noRegular Exercise - noDrug Use - no    Family History  Problem Relation Age of Onset  . Cardiomyopathy Father   . Diabetes Father   . Cancer       son  . Coronary artery disease      son    Past  Medical History  Diagnosis Date  . Edema     . EF 65-70%, echo, November 29, 2010  . Pulmonary hypertension      46 mmHg... echo... January, 2012  . Cellulitis     Superficial cellulitis right lower leg   January 14, 2011  . AV block      2:1  January 14, 2011 / pacemaker plan when cellulitis resolved in length  . Bradycardia     2:1  AV block  . Hypokalemia     October, 2012  . Ejection fraction     EF 65%, echo, January, 2012  . Murmur     No significant valvular disease by echo, January, 2012    Past Surgical History  Procedure Date  . Partial hysterectomy   . Hip arthroplasty     total    ROS   Patient denies fever, chills, headache, sweats, rash, change in vision, change in hearing, chest pain, cough, nausea vomiting, urinary symptoms. All other systems are reviewed  and are negative.  PHYSICAL EXAM   Patient is stable in a wheelchair today. She is oriented to person time and place. Affect is normal. Head is atraumatic. Lungs are clear. Respiratory effort is nonlabored. Cardiac exam reveals S1 and S2. There is a soft systolic murmur. The abdomen is soft. She has no significant edema in the left leg. She has mild edema in her right leg. There is mild warmth to her right lower extremity. This is chronic.  Filed Vitals:   11/10/11 1507  BP: 190/52  Pulse: 48  Height: 5' (1.524 m)  Weight: 168 lb 12.8 oz (76.567 kg)    EKG  ASSESSMENT & PLAN

## 2011-11-10 NOTE — Assessment & Plan Note (Signed)
The patient has 2-1 AV block. We have known this for a long time. She does not have syncope.  However her resting heart rate is low. I have carefully tried to treat her edema. She does not have active infection in her legs. I am hopeful that we will be able to proceed with a pacemaker if Dr. Ladona Ridgel feels it's appropriate.

## 2011-11-10 NOTE — Assessment & Plan Note (Signed)
I feel we have her edema optimized. There is still mild edema in her right lower extremity. There is some mild warmth in the lower extremity. This is a chronic problem for her. She does not have active cellulitis at this time. I am making an appointment for her to see Dr. Ladona Ridgel soon. I am hopeful that he will be comfortable with her current status and feel it is safe to proceed with placement of her pacemaker. I am hopeful that this may help with control of her volume status.

## 2011-11-10 NOTE — Assessment & Plan Note (Signed)
She does not have active cellulitis at this time. We have shown that careful attention to her volume status keep her legs stable. I am hopeful that with a pacing that we may be able to better control her volume status. Regardless it will take very careful ongoing attention.

## 2011-11-11 LAB — BASIC METABOLIC PANEL
BUN: 53 mg/dL — ABNORMAL HIGH (ref 6–23)
CO2: 31 mEq/L (ref 19–32)
Chloride: 101 mEq/L (ref 96–112)
Glucose, Bld: 89 mg/dL (ref 70–99)
Potassium: 4.3 mEq/L (ref 3.5–5.1)
Sodium: 141 mEq/L (ref 135–145)

## 2011-11-16 ENCOUNTER — Encounter: Payer: Self-pay | Admitting: Internal Medicine

## 2011-11-16 ENCOUNTER — Encounter: Payer: Self-pay | Admitting: *Deleted

## 2011-11-16 ENCOUNTER — Ambulatory Visit (INDEPENDENT_AMBULATORY_CARE_PROVIDER_SITE_OTHER): Payer: Medicare Other | Admitting: Internal Medicine

## 2011-11-16 DIAGNOSIS — I4430 Unspecified atrioventricular block: Secondary | ICD-10-CM

## 2011-11-16 DIAGNOSIS — R609 Edema, unspecified: Secondary | ICD-10-CM

## 2011-11-16 NOTE — Assessment & Plan Note (Signed)
She continues to have 2:1 AV block. I have discussed the treatment options with the patient and her family as well as Dr. Myrtis Ser. She has a reduced cardiac output. I have recommended proceeding with PPM. The risks/benefits/goals/expectations of PPM have been discussed and she wishes to proceed.

## 2011-11-16 NOTE — Progress Notes (Signed)
HPI [name] return today for followup. She is a pleasant 77 yo woman with a h/o 2:1 AV block, HTN, diastolic heart failure, and previously cellulitis. She has had problems with volume overload. I saw her several months ago, she had 2:1 heart block but active cellulitis and we decided to defer insertion of a permanent pacemaker. Since then she has been difficult to diurese but has had no additional diuretic.  Allergies  Allergen Reactions  . Aspirin     REACTION: rash     Current Outpatient Prescriptions  Medication Sig Dispense Refill  . Cholecalciferol (VITAMIN D3) 1000 UNITS CAPS Take 1 capsule by mouth daily.        . cyclobenzaprine (FLEXERIL) 5 MG tablet Take 10 mg by mouth 2 (two) times daily as needed.       . diazepam (VALIUM) 5 MG tablet 5 mg. Every 4 hours as needed       . furosemide (LASIX) 80 MG tablet TAKE ONE TABLET BY MOUTH TWICE DAILY  60 tablet  6  . HYDROcodone-acetaminophen (LORTAB) 10-500 MG per tablet Take 1 tablet by mouth every 4 (four) hours as needed.        . meloxicam (MOBIC) 15 MG tablet Take 15 mg by mouth daily.        . Multiple Vitamin (MULTIVITAMIN) capsule Take 1 capsule by mouth daily.        . potassium chloride SA (K-DUR,KLOR-CON) 20 MEQ tablet Take 20 mEq by mouth 2 (two) times daily.           Past Medical History  Diagnosis Date  . Edema     . EF 65-70%, echo, November 29, 2010  . Pulmonary hypertension      46 mmHg... echo... January, 2012  . Cellulitis     Superficial cellulitis right lower leg   January 14, 2011  . AV block      2:1  January 14, 2011 / pacemaker plan when cellulitis resolved in length  . Bradycardia     2:1  AV block  . Hypokalemia     October, 2012  . Ejection fraction     EF 65%, echo, January, 2012  . Murmur     No significant valvular disease by echo, January, 2012    ROS:   All systems reviewed and negative except as noted in the HPI.   Past Surgical History  Procedure Date  . Partial hysterectomy     . Hip arthroplasty     total     Family History  Problem Relation Age of Onset  . Cardiomyopathy Father   . Diabetes Father   . Cancer       son  . Coronary artery disease      son     History   Social History  . Marital Status: Widowed    Spouse Name: N/A    Number of Children: N/A  . Years of Education: N/A   Occupational History  . Retired    Social History Main Topics  . Smoking status: Never Smoker   . Smokeless tobacco: Not on file  . Alcohol Use: No  . Drug Use: No  . Sexually Active: Not on file   Other Topics Concern  . Not on file   Social History Narrative   Retired Widowed Tobacco Use - No. Alcohol Use - noRegular Exercise - noDrug Use - no     BP 205/59  Pulse 49  Ht 5\' 5"  (1.651 m)  Wt 75.751 kg (167 lb)  BMI 27.79 kg/m2  Physical Exam:  elderly appearing NAD HEENT: Unremarkable Neck:  No JVD, no thyromegally Lymphatics:  No adenopathy Back:  No CVA tenderness Lungs:  Clear with no wheezes, except for basilar rales. HEART:  Regular rate rhythm, no murmurs, no rubs, no clicks Abd:  soft, positive bowel sounds, no organomegally, no rebound, no guarding Ext:  2 plus pulses, no edema, no cyanosis, no clubbing Skin:  No rashes no nodules Neuro:  CN II through XII intact, motor grossly intact  EKG - NSR with 2:1 AV block.  Assess/Plan:

## 2011-11-16 NOTE — Assessment & Plan Note (Signed)
Hopefully her peripheral edema will improve after pacing. She will maintain low sodium diet.

## 2011-11-16 NOTE — Patient Instructions (Signed)

## 2011-11-17 NOTE — Progress Notes (Signed)
Addended by: Dennis Bast F on: 11/17/2011 11:51 AM   Modules accepted: Orders

## 2011-11-18 ENCOUNTER — Other Ambulatory Visit: Payer: Self-pay | Admitting: *Deleted

## 2011-11-18 DIAGNOSIS — I459 Conduction disorder, unspecified: Secondary | ICD-10-CM

## 2011-11-22 HISTORY — PX: INSERT / REPLACE / REMOVE PACEMAKER: SUR710

## 2011-11-24 ENCOUNTER — Telehealth: Payer: Self-pay | Admitting: Cardiology

## 2011-11-24 ENCOUNTER — Encounter (HOSPITAL_COMMUNITY): Payer: Self-pay

## 2011-11-24 NOTE — Telephone Encounter (Signed)
[name], Mrs Kuklinski's daughter, is requesting a letter from Dr Myrtis Ser stating that she needs to be off from 1/17-12/23/11 to care for her mother, who will be needing assistance,  following pacemaker placement.  She needs it by 12/02/11 and would like to have it faxed to her at [phone].

## 2011-11-24 NOTE — Telephone Encounter (Signed)
New Problem:    Patient's daughter called needing a letter from Dr. Myrtis Ser stating the necesity for her to take time off from her job to take care of her mother after she has her pacemaker surgery. Please call back.

## 2011-11-25 NOTE — Telephone Encounter (Signed)
Letter was written and faxed

## 2011-11-29 NOTE — Telephone Encounter (Signed)
New Msg: pt daughter calling wanting to speak with nurse regarding pt upcoming appt with Dr. Myrtis Ser. Please return pt daughter call to discuss further.

## 2011-11-29 NOTE — Telephone Encounter (Signed)
Mrs Vandenberg wants to know if she should keep her appt on Thursday.  She states her legs are red and puffy.  She thinks they are about the same as they were at her last appt but she developed a blister that broke open.  The blister is now healing per pt.  She does not think she has gained any weight but has not recently weighed since [facility] took her scale away.

## 2011-11-30 NOTE — Telephone Encounter (Signed)
Pt was notified.  

## 2011-11-30 NOTE — Telephone Encounter (Signed)
YES  Keep appointment

## 2011-12-01 ENCOUNTER — Ambulatory Visit (INDEPENDENT_AMBULATORY_CARE_PROVIDER_SITE_OTHER): Payer: Medicare Other | Admitting: Cardiology

## 2011-12-01 ENCOUNTER — Other Ambulatory Visit (INDEPENDENT_AMBULATORY_CARE_PROVIDER_SITE_OTHER): Payer: Medicare Other | Admitting: *Deleted

## 2011-12-01 ENCOUNTER — Encounter: Payer: Self-pay | Admitting: Cardiology

## 2011-12-01 ENCOUNTER — Telehealth: Payer: Self-pay | Admitting: Cardiology

## 2011-12-01 DIAGNOSIS — R609 Edema, unspecified: Secondary | ICD-10-CM

## 2011-12-01 DIAGNOSIS — I4430 Unspecified atrioventricular block: Secondary | ICD-10-CM

## 2011-12-01 DIAGNOSIS — E876 Hypokalemia: Secondary | ICD-10-CM

## 2011-12-01 NOTE — Assessment & Plan Note (Signed)
Her labs are being watched carefully and will be repeated.

## 2011-12-01 NOTE — Telephone Encounter (Signed)
New msg °Pt's daughter wants to talk to you °

## 2011-12-01 NOTE — Patient Instructions (Signed)
Your physician recommends that you schedule a follow-up appointment in: 2 months Your physician has recommended you make the following change in your medication: EXTRA Zaroxolyn on Saturday

## 2011-12-01 NOTE — Telephone Encounter (Signed)
Answered [name]'s questions about her mom's appt.

## 2011-12-01 NOTE — Progress Notes (Signed)
HPI Patient is seen today to followup edema and AV block. She has seen Dr. Ladona Ridgel. There has been very careful consideration for pacemaker. Dr. Ladona Ridgel reviewed all of my records and we then spoke on the telephone. It has been decided that she may well benefit from pacing. This is scheduled for December 08, 2010. She's not having any chest pain. We have managed to control her volume status relatively well by using Zaroxolyn as an additional medicine on Tuesdays and Fridays. Today she does have some increased swelling in her right leg. We have seen this type of swelling with mild warmth in the extremity related to volume.The patient did have venous Doppler done as part of vascular evaluation. In April, 2012, the study was very difficult. It was difficult because of marked edema. The calf veins could not be visualized well. However there was no evidence of deep venous thrombosis in the vessels that could be evaluated.    Allergies  Allergen Reactions  . Aspirin     REACTION: rash    Current Outpatient Prescriptions  Medication Sig Dispense Refill  . Cholecalciferol (VITAMIN D3) 1000 UNITS CAPS Take 1 capsule by mouth daily.        . cyclobenzaprine (FLEXERIL) 5 MG tablet Take 5-10 mg by mouth 2 (two) times daily as needed. For muscle spasm.      . diazepam (VALIUM) 5 MG tablet Take 5 mg by mouth 2 (two) times daily as needed. For anxiety.      . furosemide (LASIX) 80 MG tablet TAKE ONE TABLET BY MOUTH TWICE DAILY  60 tablet  6  . HYDROcodone-acetaminophen (LORTAB) 10-500 MG per tablet Take 1 tablet by mouth every 4 (four) hours as needed. For pain.      . meloxicam (MOBIC) 15 MG tablet Take 15 mg by mouth daily.        . metolazone (ZAROXOLYN) 2.5 MG tablet Take 2.5 mg by mouth 2 (two) times a week. On Tuesday and Friday.       . Multiple Vitamins-Minerals (MULTIVITAMINS THER. W/MINERALS) TABS Take 1 tablet by mouth daily.        . potassium chloride SA (K-DUR,KLOR-CON) 20 MEQ tablet Take 20 mEq by  mouth 2 (two) times daily.          History   Social History  . Marital Status: Widowed    Spouse Name: N/A    Number of Children: N/A  . Years of Education: N/A   Occupational History  . Retired    Social History Main Topics  . Smoking status: Never Smoker   . Smokeless tobacco: Not on file  . Alcohol Use: No  . Drug Use: No  . Sexually Active: Not on file   Other Topics Concern  . Not on file   Social History Narrative   Retired Widowed Tobacco Use - No. Alcohol Use - noRegular Exercise - noDrug Use - no    Family History  Problem Relation Age of Onset  . Cardiomyopathy Father   . Diabetes Father   . Cancer       son  . Coronary artery disease      son    Past Medical History  Diagnosis Date  . Edema     . EF 65-70%, echo, November 29, 2010  . Pulmonary hypertension      46 mmHg... echo... January, 2012  . Cellulitis     Superficial cellulitis right lower leg   January 14, 2011  . AV block  2:1  January 14, 2011 / pacemaker plan when cellulitis resolved in length  . Bradycardia     2:1  AV block  . Hypokalemia     October, 2012  . Ejection fraction     EF 65%, echo, January, 2012  . Murmur     No significant valvular disease by echo, January, 2012    Past Surgical History  Procedure Date  . Partial hysterectomy   . Hip arthroplasty     total    ROS  Patient denies fever, chills, headache, sweats, rash, change in vision, change in hearing, chest pain, cough, nausea vomiting, urinary symptoms. All other systems are reviewed and are negative.  PHYSICAL EXAM  Patient is here in a wheelchair as always. She is stable. She has slight discomfort in her right lower extremity. She is oriented to person time and place. Affect is normal. Lungs are clear. Respiratory effort is nonlabored. Cardiac exam reveals S1 and S2. There no clicks or significant murmurs. The abdomen is soft. There is trace edema in her left leg. There is 1+ edema in the right leg  with slight swelling of the right calf and slight warmth. This is a chronic intermittent problem for her.  Filed Vitals:   12/01/11 1516  BP: 130/54  Pulse: 56  Height: 5' (1.524 m)  Weight: 159 lb 6.4 oz (72.303 kg)    ASSESSMENT & PLAN

## 2011-12-01 NOTE — Assessment & Plan Note (Signed)
The patient will be receiving a pacemaker. I am hopeful this will help with her overall cardiac status. This has been very carefully thought through overtime.

## 2011-12-01 NOTE — Assessment & Plan Note (Signed)
This is a complex problem for this patient. With good diuresis she does well. I will give her one extra dose of Zaroxolyn this week. She will be having labs to get her 80 for her pacemaker. I am hopeful that pacing may help somewhat with her volume status. We will have to wait and see. I have reviewed her records extensively. I went searching for her Doppler data to be sure that we had verified in the past that there was no definite deep venous thrombosis in her right leg. She did not have deep venous thrombosis.

## 2011-12-02 ENCOUNTER — Other Ambulatory Visit: Payer: Medicare Other | Admitting: *Deleted

## 2011-12-02 ENCOUNTER — Telehealth: Payer: Self-pay | Admitting: *Deleted

## 2011-12-02 LAB — BASIC METABOLIC PANEL
Calcium: 9.6 mg/dL (ref 8.4–10.5)
Creatinine, Ser: 1.2 mg/dL (ref 0.4–1.2)
GFR: 57.53 mL/min — ABNORMAL LOW (ref >60.00)
Sodium: 140 mEq/L (ref 135–145)

## 2011-12-02 LAB — CBC WITH DIFFERENTIAL/PLATELET
Basophils Absolute: 0 10*3/uL (ref 0.0–0.1)
Eosinophils Absolute: 0.1 10*3/uL (ref 0.0–0.7)
HCT: 35 % — ABNORMAL LOW (ref 36.0–46.0)
Lymphs Abs: 1.4 10*3/uL (ref 0.7–4.0)
MCV: 90 fl (ref 78.0–100.0)
Monocytes Absolute: 0.5 10*3/uL (ref 0.1–1.0)
Neutrophils Relative %: 64.3 % (ref 43.0–77.0)
Platelets: 256 10*3/uL (ref 150.0–400.0)
RDW: 14.4 % (ref 11.5–14.6)

## 2011-12-02 LAB — PROTIME-INR: Prothrombin Time: 11.6 s (ref 10.2–12.4)

## 2011-12-06 NOTE — Telephone Encounter (Signed)
Error--nt 

## 2011-12-08 MED ORDER — CHLORHEXIDINE GLUCONATE 4 % EX LIQD
60.0000 mL | Freq: Once | CUTANEOUS | Status: DC
  Filled 2011-12-08: qty 60

## 2011-12-08 MED ORDER — SODIUM CHLORIDE 0.9 % IR SOLN
80.0000 mg | Status: DC
  Filled 2011-12-08: qty 2

## 2011-12-08 MED ORDER — CEFAZOLIN SODIUM 1-5 GM-% IV SOLN: 1.0000 g | INTRAVENOUS | Status: DC

## 2011-12-09 ENCOUNTER — Encounter (HOSPITAL_COMMUNITY)
Admission: RE | Disposition: A | Discharge status: Home / Self Care | Payer: Self-pay | Source: Ambulatory Visit | Attending: Internal Medicine

## 2011-12-09 ENCOUNTER — Ambulatory Visit (HOSPITAL_COMMUNITY)
Admission: RE | Admit: 2011-12-09 | Discharge: 2011-12-10 | Disposition: A | Discharge status: Home / Self Care | Payer: Medicare Other | Source: Ambulatory Visit | Attending: Internal Medicine | Admitting: Internal Medicine

## 2011-12-09 DIAGNOSIS — Z950 Presence of cardiac pacemaker: Secondary | ICD-10-CM

## 2011-12-09 DIAGNOSIS — I4430 Unspecified atrioventricular block: Secondary | ICD-10-CM | POA: Insufficient documentation

## 2011-12-09 DIAGNOSIS — M25559 Pain in unspecified hip: Secondary | ICD-10-CM | POA: Insufficient documentation

## 2011-12-09 DIAGNOSIS — I441 Atrioventricular block, second degree: Secondary | ICD-10-CM | POA: Diagnosis not present

## 2011-12-09 DIAGNOSIS — I10 Essential (primary) hypertension: Secondary | ICD-10-CM | POA: Diagnosis not present

## 2011-12-09 DIAGNOSIS — G8929 Other chronic pain: Secondary | ICD-10-CM | POA: Diagnosis not present

## 2011-12-09 DIAGNOSIS — I459 Conduction disorder, unspecified: Secondary | ICD-10-CM

## 2011-12-09 DIAGNOSIS — I2789 Other specified pulmonary heart diseases: Secondary | ICD-10-CM | POA: Insufficient documentation

## 2011-12-09 DIAGNOSIS — I5030 Unspecified diastolic (congestive) heart failure: Secondary | ICD-10-CM | POA: Diagnosis not present

## 2011-12-09 DIAGNOSIS — R609 Edema, unspecified: Secondary | ICD-10-CM | POA: Insufficient documentation

## 2011-12-09 HISTORY — PX: PERMANENT PACEMAKER INSERTION: SHX5480

## 2011-12-09 SURGERY — PERMANENT PACEMAKER INSERTION
Anesthesia: Moderate Sedation

## 2011-12-09 MED ORDER — HYDRALAZINE HCL 20 MG/ML IJ SOLN
INTRAMUSCULAR | Status: AC
  Filled 2011-12-09: qty 1

## 2011-12-09 MED ORDER — MUPIROCIN 2 % EX OINT
TOPICAL_OINTMENT | Freq: Two times a day (BID) | CUTANEOUS | Status: DC
  Filled 2011-12-09: qty 22

## 2011-12-09 MED ORDER — ACETAMINOPHEN 325 MG PO TABS: 325.0000 mg | ORAL_TABLET | ORAL | Status: DC | PRN

## 2011-12-09 MED ORDER — HYDROCODONE-ACETAMINOPHEN 10-325 MG PO TABS
1.0000 | ORAL_TABLET | ORAL | Status: DC | PRN
  Administered 2011-12-09 – 2011-12-10 (×3): 1 via ORAL
  Filled 2011-12-09 (×4): qty 1

## 2011-12-09 MED ORDER — METOLAZONE 2.5 MG PO TABS: 2.5000 mg | ORAL_TABLET | ORAL | Status: DC

## 2011-12-09 MED ORDER — THERA M PLUS PO TABS
1.0000 | ORAL_TABLET | Freq: Every day | ORAL | Status: DC
  Administered 2011-12-09 – 2011-12-10 (×2): 1 via ORAL
  Filled 2011-12-09 (×2): qty 1

## 2011-12-09 MED ORDER — HYDROCODONE-ACETAMINOPHEN 10-500 MG PO TABS: 1.0000 | ORAL_TABLET | ORAL | Status: DC | PRN

## 2011-12-09 MED ORDER — MUPIROCIN 2 % EX OINT
TOPICAL_OINTMENT | CUTANEOUS | Status: AC
  Filled 2011-12-09: qty 22

## 2011-12-09 MED ORDER — SODIUM CHLORIDE 0.45 % IV SOLN: INTRAVENOUS | Status: DC

## 2011-12-09 MED ORDER — DIAZEPAM 5 MG PO TABS
5.0000 mg | ORAL_TABLET | Freq: Two times a day (BID) | ORAL | Status: DC | PRN
  Administered 2011-12-09: 5 mg via ORAL
  Filled 2011-12-09: qty 1

## 2011-12-09 MED ORDER — FENTANYL CITRATE 0.05 MG/ML IJ SOLN
INTRAMUSCULAR | Status: AC
  Filled 2011-12-09: qty 2

## 2011-12-09 MED ORDER — ONDANSETRON HCL 4 MG/2ML IJ SOLN: 4.0000 mg | Freq: Four times a day (QID) | INTRAMUSCULAR | Status: DC | PRN

## 2011-12-09 MED ORDER — MELOXICAM 15 MG PO TABS
15.0000 mg | ORAL_TABLET | Freq: Every day | ORAL | Status: DC
  Administered 2011-12-09 – 2011-12-10 (×2): 15 mg via ORAL
  Filled 2011-12-09 (×2): qty 1

## 2011-12-09 MED ORDER — SODIUM CHLORIDE 0.9 % IV SOLN: INTRAVENOUS | Status: DC

## 2011-12-09 MED ORDER — CEFAZOLIN SODIUM 1-5 GM-% IV SOLN
1.0000 g | Freq: Four times a day (QID) | INTRAVENOUS | Status: AC
  Administered 2011-12-09 – 2011-12-10 (×3): 1 g via INTRAVENOUS
  Filled 2011-12-09 (×3): qty 50

## 2011-12-09 MED ORDER — HEPARIN (PORCINE) IN NACL 2-0.9 UNIT/ML-% IJ SOLN
INTRAMUSCULAR | Status: AC
  Filled 2011-12-09: qty 1000

## 2011-12-09 MED ORDER — POTASSIUM CHLORIDE CRYS ER 20 MEQ PO TBCR
20.0000 meq | EXTENDED_RELEASE_TABLET | Freq: Two times a day (BID) | ORAL | Status: DC
  Administered 2011-12-09 – 2011-12-10 (×3): 20 meq via ORAL
  Filled 2011-12-09 (×4): qty 1

## 2011-12-09 MED ORDER — MIDAZOLAM HCL 5 MG/5ML IJ SOLN
INTRAMUSCULAR | Status: AC
  Filled 2011-12-09: qty 5

## 2011-12-09 MED ORDER — FUROSEMIDE 40 MG PO TABS
40.0000 mg | ORAL_TABLET | Freq: Every day | ORAL | Status: DC
Start: 2011-12-09 — End: 2011-12-10
  Administered 2011-12-09 – 2011-12-10 (×2): 40 mg via ORAL
  Filled 2011-12-09 (×2): qty 1

## 2011-12-09 MED ORDER — CYCLOBENZAPRINE HCL 5 MG PO TABS
5.0000 mg | ORAL_TABLET | Freq: Two times a day (BID) | ORAL | Status: DC | PRN
  Filled 2011-12-09: qty 2

## 2011-12-09 MED ORDER — LIDOCAINE HCL (PF) 1 % IJ SOLN
INTRAMUSCULAR | Status: AC
  Filled 2011-12-09: qty 60

## 2011-12-09 NOTE — Op Note (Signed)
[name], [name] NO.:  [id number]  MEDICAL RECORD NO.:  [id number]  LOCATION:  3728                         FACILITY:  [facility]  PHYSICIAN:  Doylene Canning. Ladona Ridgel, MD    DATE OF BIRTH:  11-19-1931  DATE OF PROCEDURE:  12/09/2011 DATE OF DISCHARGE:                              OPERATIVE REPORT   PROCEDURE PERFORMED:  Insertion of a dual-chamber pacemaker.  INDICATION:  Symptomatic 2:1 heart block.  INTRODUCTION:  The patient is a 78 year old woman with hypertension and longstanding AV conduction system disease.  She was subsequently found to 2 persistent 2:1 heart block despite no AV nodal blocking drugs and is referred for permanent pacemaker insertion.  PROCEDURE:  After informed consent was obtained, the patient was taken to the diagnostic EP lab in a fasting state.  After usual preparation and draping, intravenous fentanyl ands midazolam was given for sedation. A 30 mL of lidocaine was infiltrated into the left infraclavicular region.  A 5 cm incision was carried out over this region and electrocautery was utilized to dissect down to the fascia plane.  Left subclavian vein was punctured x2 and the Medtronic model 5076, 52 cm active fixation pacing lead, serial number [id number] was advanced into the right ventricle and the Medtronic model 5076, 45 cm active fixation pacing lead, serial number [id number] was advanced to the right atrium. Mapping was carried out in the right ventricle.  Initially at the final site, the R-waves were 16 mV, the pacing impedance was 800 ohms and the threshold was around a volt at 0.5 msec.  There was large current of injury present and no diaphragmatic stimulation with 10 V pacing.  With the ventricular lead in satisfactory position, attention was then turned to the atrial lead which was placed in the anterolateral portion of the right atrium where P-waves measured between 1.5 and 2 mV.  The pacing impedance was 500 ohms with lead  actively fixed and threshold was around a volt at 0.5 msec.  Again, there was large injury current present.  A 10 V pacing did not stimulate the diaphragm.  With these satisfactory parameters, the lead was secured to the subpectoral fascia with a figure- of-eight silk suture.  Sewing sleeve was secured with silk suture. Electrocautery was utilized to make subcutaneous pocket.  At this point, the Medtronic [facility] dual-chamber pacemaker, serial number [id number] was connected to the atrial and the ventricular lead and placed back in the subcutaneous pocket where we secured with silk suture.  The pocket was irrigated with antibiotic irrigation and incision closed with 2-0 and 3-0 Vicryl.  Benzoin and Steri-Strips were painted on the skin, a pressure dressing was applied, and the patient was returned to her room in satisfactory condition.  COMPLICATIONS:  There were no immediate procedure complications.  RESULTS:  This demonstrates successful implantation of a Medtronic dual- chamber pacemaker in a patient with symptomatic 2:1 heart block.     Doylene Canning. Ladona Ridgel, MD     GWT/MEDQ  D:  12/09/2011  T:  12/09/2011  Job:  [id number]  cc:   Luis Abed, MD, [facility]

## 2011-12-09 NOTE — H&P (View-Only) (Signed)
HPI [name] return today for followup. She is a pleasant 78 yo woman with a h/o 2:1 AV block, HTN, diastolic heart failure, and previously cellulitis. She has had problems with volume overload. I saw her several months ago, she had 2:1 heart block but active cellulitis and we decided to defer insertion of a permanent pacemaker. Since then she has been difficult to diurese but has had no additional diuretic.  Allergies  Allergen Reactions  . Aspirin     REACTION: rash     Current Outpatient Prescriptions  Medication Sig Dispense Refill  . Cholecalciferol (VITAMIN D3) 1000 UNITS CAPS Take 1 capsule by mouth daily.        . cyclobenzaprine (FLEXERIL) 5 MG tablet Take 10 mg by mouth 2 (two) times daily as needed.       . diazepam (VALIUM) 5 MG tablet 5 mg. Every 4 hours as needed       . furosemide (LASIX) 80 MG tablet TAKE ONE TABLET BY MOUTH TWICE DAILY  60 tablet  6  . HYDROcodone-acetaminophen (LORTAB) 10-500 MG per tablet Take 1 tablet by mouth every 4 (four) hours as needed.        . meloxicam (MOBIC) 15 MG tablet Take 15 mg by mouth daily.        . Multiple Vitamin (MULTIVITAMIN) capsule Take 1 capsule by mouth daily.        . potassium chloride SA (K-DUR,KLOR-CON) 20 MEQ tablet Take 20 mEq by mouth 2 (two) times daily.           Past Medical History  Diagnosis Date  . Edema     . EF 65-70%, echo, November 29, 2010  . Pulmonary hypertension      46 mmHg... echo... January, 2012  . Cellulitis     Superficial cellulitis right lower leg   January 14, 2011  . AV block      2:1  January 14, 2011 / pacemaker plan when cellulitis resolved in length  . Bradycardia     2:1  AV block  . Hypokalemia     October, 2012  . Ejection fraction     EF 65%, echo, January, 2012  . Murmur     No significant valvular disease by echo, January, 2012    ROS:   All systems reviewed and negative except as noted in the HPI.   Past Surgical History  Procedure Date  . Partial hysterectomy     . Hip arthroplasty     total     Family History  Problem Relation Age of Onset  . Cardiomyopathy Father   . Diabetes Father   . Cancer       son  . Coronary artery disease      son     History   Social History  . Marital Status: Widowed    Spouse Name: N/A    Number of Children: N/A  . Years of Education: N/A   Occupational History  . Retired    Social History Main Topics  . Smoking status: Never Smoker   . Smokeless tobacco: Not on file  . Alcohol Use: No  . Drug Use: No  . Sexually Active: Not on file   Other Topics Concern  . Not on file   Social History Narrative   Retired Widowed Tobacco Use - No. Alcohol Use - noRegular Exercise - noDrug Use - no     BP 205/59  Pulse 49  Ht 5' 5" (1.651 m)    Wt 75.751 kg (167 lb)  BMI 27.79 kg/m2  Physical Exam:  elderly appearing NAD HEENT: Unremarkable Neck:  No JVD, no thyromegally Lymphatics:  No adenopathy Back:  No CVA tenderness Lungs:  Clear with no wheezes, except for basilar rales. HEART:  Regular rate rhythm, no murmurs, no rubs, no clicks Abd:  soft, positive bowel sounds, no organomegally, no rebound, no guarding Ext:  2 plus pulses, no edema, no cyanosis, no clubbing Skin:  No rashes no nodules Neuro:  CN II through XII intact, motor grossly intact  EKG - NSR with 2:1 AV block.  Assess/Plan:   

## 2011-12-09 NOTE — Interval H&P Note (Signed)
History and Physical Interval Note:  12/09/2011 7:35 AM  [name]  has presented today for surgery, with the diagnosis of Heart Block  The various methods of treatment have been discussed with the patient and family. After consideration of risks, benefits and other options for treatment, the patient has consented to  Procedure(s): PERMANENT PACEMAKER INSERTION as a surgical intervention .  The patients' history has been reviewed, patient examined, no change in status, stable for surgery.  I have reviewed the patients' chart and labs.  Questions were answered to the patient's satisfaction.     Lewayne Bunting

## 2011-12-09 NOTE — Op Note (Signed)
DDD PPM implanted via left subclavian vein without immediate complication. Z#[id number].

## 2011-12-10 ENCOUNTER — Other Ambulatory Visit: Payer: Self-pay

## 2011-12-10 ENCOUNTER — Ambulatory Visit (HOSPITAL_COMMUNITY): Payer: Medicare Other

## 2011-12-10 DIAGNOSIS — I441 Atrioventricular block, second degree: Secondary | ICD-10-CM | POA: Diagnosis not present

## 2011-12-10 DIAGNOSIS — I10 Essential (primary) hypertension: Secondary | ICD-10-CM | POA: Diagnosis not present

## 2011-12-10 DIAGNOSIS — Z950 Presence of cardiac pacemaker: Secondary | ICD-10-CM

## 2011-12-10 DIAGNOSIS — I5030 Unspecified diastolic (congestive) heart failure: Secondary | ICD-10-CM | POA: Diagnosis not present

## 2011-12-10 DIAGNOSIS — I2789 Other specified pulmonary heart diseases: Secondary | ICD-10-CM | POA: Diagnosis not present

## 2011-12-10 MED ORDER — HYDROCODONE-ACETAMINOPHEN 10-325 MG PO TABS
1.0000 | ORAL_TABLET | ORAL | Status: DC | PRN
  Administered 2011-12-10: 1 via ORAL
  Filled 2011-12-10: qty 1

## 2011-12-10 MED ORDER — HYDROCODONE-ACETAMINOPHEN 10-325 MG PO TABS: 1.0000 | ORAL_TABLET | Freq: Four times a day (QID) | ORAL | Status: AC | PRN

## 2011-12-10 NOTE — Progress Notes (Signed)
   CARE MANAGEMENT NOTE 12/10/2011  Patient:  [name], [name]   Account Number:  [id number]  Date Initiated:  12/10/2011  Documentation initiated by:  [facility]  Subjective/Objective Assessment:   Pacemaker     Action/Plan:   lives alone   Anticipated DC Date:  12/10/2011   Anticipated DC Plan:  HOME W HOME HEALTH SERVICES      DC Planning Services  CM consult      [facility] Choice  HOME HEALTH   Choice offered to / List presented to:  C-4 Adult Children   DME arranged  3-N-1      DME agency  [facility]     [facility] arranged  HH-1 RN  HH-4 NURSE'S AIDE      HH agency  [facility]   Status of service:  Completed, signed off Medicare Important Message given?   (If response is "NO", the following Medicare IM given date fields will be blank) Date Medicare IM given:   Date Additional Medicare IM given:    Discharge Disposition:  HOME W HOME HEALTH SERVICES  Per UR Regulation:    Comments:  12/10/2011 1700 Faxed order for 3n1 to [facility] for home delivery. Isidoro Donning RN CCM Case Mgmt phone [phone]  12/10/2011 1300 Spoke to daughter, [place] # [phone]. States she will be here for a week, and mother will need assistance at home. She wanted [facility] agency affiliated with hospital. [facility] and gave information on families concern. Pt lives at home alone and dtr is only visiting for a short period. Contacted [facility] for [facility] RN and aide with start of care for RN on 12/10/2010. Isidoro Donning RN CCM Case Mgmt phone [phone]

## 2011-12-10 NOTE — Progress Notes (Signed)
   CARE MANAGEMENT NOTE 12/10/2011  Patient:  [name], [name]   Account Number:  [id number]  Date Initiated:  12/10/2011  Documentation initiated by:  [facility]  Subjective/Objective Assessment:   Pacemaker     Action/Plan:   lives alone   Anticipated DC Date:  12/10/2011   Anticipated DC Plan:  HOME W HOME HEALTH SERVICES      DC Planning Services  CM consult      [facility] Choice  HOME HEALTH   Choice offered to / List presented to:  C-4 Adult Children        HH arranged  HH-1 RN  HH-4 NURSE'S AIDE      HH agency  [facility]   Status of service:  Completed, signed off Medicare Important Message given?   (If response is "NO", the following Medicare IM given date fields will be blank) Date Medicare IM given:   Date Additional Medicare IM given:    Discharge Disposition:  HOME W HOME HEALTH SERVICES  Per UR Regulation:    Comments:  12/10/2011 1300 Spoke to daughter, [place] # [phone]. States she will be here for a week, and mother will need assistance at home. She wanted [facility] agency affiliated with hospital. [facility] and gave information on families concern. Pt lives at home alone and dtr is only visiting for a short period. Contacted [facility] for [facility] RN and aide with start of care for RN on 12/10/2010. Isidoro Donning RN CCM Case Mgmt phone [phone]

## 2011-12-10 NOTE — Discharge Summary (Signed)
Discharge Summary   Patient ID: [name] MRN: [id number], DOB/AGE: 1930-11-30 77 y.o.  Primary MD: Alice Reichert, MD, MD Primary Cardiologist: Willa Rough MD/Taylor, Sharlot Gowda MD Admit date: 12/09/2011 D/C date:     12/10/2011      Primary Discharge Diagnoses:  1. Symptomatic bradycardia/2:1 heart block  - s/p successful implantation of a Medtronic dual-chamber pacemaker  Secondary Discharge Diagnoses:  1. Hypertension 2. Edema 3. Cellulitis - Superficial cellulitis right lower leg January 14, 2011  4. Pulmonary HTN - 46 mmHg... echo... January, 2012  5. Hypokalemia - October, 2012 6. Murmur - No significant valvular disease by echo, January, 2012   Allergies Allergen Reactions  . Aspirin     REACTION: rash    Diagnostic Studies/Procedures:  12/09/11 - Insertion of a dual-chamber pacemaker - Medtronic model 5076, 52 cm active fixation pacing lead, serial number [id number] was inserted in the RV - Medtronic model 5076, 45 cm active fixation  pacing lead, serial number [id number] was inserted in the RA - Medtronic Cynthia dual-chamber pacemaker, serial number [id number] was connected to the atrial and the ventricular lead   History of Present Illness: 78 y.o. female w/ PMHx significant for HTN and symptomatic bradycardia/2:1 heart block who presented to [facility] on 12/09/11 for planned PPM impantation.  She was found to have persistent 2:1 heart block despite no AV nodal blocking drugs and referred for permanent pacemaker insertion, however, had active cellulitis and had to defer insertion of a permanent pacemaker. She was seen in clinic by Dr. Ladona Ridgel on 11/16/11 for follow up and was determined stable for planned PPM implantation.    Hospital Course:  She underwent implantation of a Medtronic Dual-chamber pacemaker on 12/09/11. She tolerated the procedure well without complications. Post-op CXR was without pneumothorax. She was noted to be hypertensive with  SBPs 120s-184, 162/69 this am. She was instructed to follow this at home with home BP readings and follow up with Dr. Myrtis Ser and/or her PCP.  She was seen and examined by Dr. Antoine Poche and felt stable for discharge home with plans for follow up with [facility] in ~ 1 week and follow up with Dr. Ladona Ridgel in ~ 3 mos.   Discharge Vitals: Blood pressure 162/69, pulse 75, temperature 97.6 F (36.4 C), temperature source Oral, resp. rate 16, height 4\' 9"  (1.448 m), weight 134 lb 14.7 oz (61.2 kg), SpO2 97.00%.  Labs: None   Discharge Medications   Current Discharge Medication List    START taking these medications   Details  HYDROcodone-acetaminophen (NORCO) 10-325 MG per tablet Take 1 tablet by mouth every 6 (six) hours as needed (pacemaker insertion site pain). Qty: 5 tablet, Refills: 0      CONTINUE these medications which have NOT CHANGED   Details  Cholecalciferol (VITAMIN D3) 1000 UNITS CAPS Take 1 capsule by mouth daily.      cyclobenzaprine (FLEXERIL) 5 MG tablet Take 5-10 mg by mouth 2 (two) times daily as needed. For muscle spasm.    diazepam (VALIUM) 5 MG tablet Take 5 mg by mouth 2 (two) times daily as needed. For anxiety.    furosemide (LASIX) 80 MG tablet TAKE ONE TABLET BY MOUTH TWICE DAILY Qty: 60 tablet, Refills: 6    HYDROcodone-acetaminophen (LORTAB) 10-500 MG per tablet Take 1 tablet by mouth every 4 (four) hours as needed. For pain.    meloxicam (MOBIC) 15 MG tablet Take 15 mg by mouth daily.      metolazone (ZAROXOLYN)  2.5 MG tablet Take 2.5 mg by mouth 3 (three) times a week. Takes 2.5mg  Tuesdays, Thursdays, Sundays    Multiple Vitamins-Minerals (MULTIVITAMINS THER. W/MINERALS) TABS Take 1 tablet by mouth daily.      potassium chloride SA (K-DUR,KLOR-CON) 20 MEQ tablet Take 20 mEq by mouth 2 (two) times daily.          Disposition   Discharge Orders    Future Appointments: Provider: Department: Dept Phone: Center:   12/22/2011 9:00 AM  Vella Kohler [facility] [phone] [facility]   02/07/2012 2:30 PM Luis Abed, MD [facility] [phone] [facility]     Future Orders Please Complete By Expires   Diet - low sodium heart healthy      Increase activity slowly        Follow-up Information    Follow up with Lewayne Bunting, MD in 3 months. (Our office will call you with your appointment time)    Contact information:   [facility] [address] [place] [address] [phone]       Follow up with Willa Rough, MD on 02/07/2012. (2:30)    Contact information:   [facility] [address] [place] [address] [phone]       Follow up with [facility] on 12/22/2011. (Device/Wound Check @ 9:00)    Contact information:   [address] [place] [address] [phone]      Follow up with [facility] RN and aide)    Contact information:   [phone]          Outstanding Labs/Studies: None  Duration of Discharge Encounter: Greater than 30 minutes including physician and PA time.  Signed, HOPE, JESSICA PA-C 12/10/2011, 3:24 PM  Discharge Update: Patient and daughter requesting home health assistance and bedside commode for home. Social worker notified and spoke with patient. SW recommends home health RN and home health aide. Face to face completed and order placed by MD and prescription provided for bedside commode. Received a call from pt's nurses stating patient would like a refill of her home Lortab as she only has two left. I explained to the patient that Cardiology does not refill narcotic pain prescriptions for chronic pain and she would need to discuss this with her prescribing physician (PCP). She said she was worried she would have back/shoulder pain over the weekend and doesn't have enough pain medicine. I asked her if her pacemaker insertion site was  causing her pain and she reported that it is starting to. I notified her that I would give her enough Lortab for her pacemaker insertion site pain to get her through Sunday as it should not continue to be painful. She was agreeable to this and a prescription for #5 Lortab pills was provided.  HOPE, JESSICA 12/10/2011 3:24 PM  Patient seen and examined.  Plan as discussed in my rounding note for today and outlined above. Rollene Rotunda  12/12/2011  9:55 AM

## 2011-12-10 NOTE — Progress Notes (Signed)
   SUBJECTIVE:  She is having some chronic left hip pain but no acute complaints   PHYSICAL EXAM Filed Vitals:   12/09/11 1600 12/09/11 2100 12/09/11 2140 12/10/11 0500  BP: 123/50 184/75 158/62 162/69  Pulse: 82 79  75  Temp: 98.1 F (36.7 C) 99.3 F (37.4 C)  97.6 F (36.4 C)  TempSrc: Oral Axillary  Oral  Resp: 18 20  16   Height:      Weight:    61.2 kg (134 lb 14.7 oz)  SpO2: 95% 98%  97%   General:  No distress Lungs:  Clear Heart:  RRR Abdomen:  Positive bowel sounds, no rebound no guarding Extremities:  No edema Chest:  Wound looks OK without hematoma or echymosis  Intake/Output Summary (Last 24 hours) at 12/10/11 0910 Last data filed at 12/10/11 0700  Gross per 24 hour  Intake    240 ml  Output    203 ml  Net     37 ml    EKG:  Demand pacemaker.  Rate 64  ASSESSMENT AND PLAN:  1)  CHB:  Pacemaker yesterday.  No apparent complications.  CXR OK  2)  HTN:  Her blood pressure is up slightly but I don't see that it has been up in the office.  She should follow this at home with home BP readings.  Fayrene Fearing Deke Tilghman 12/10/2011 9:10 AM

## 2011-12-11 DIAGNOSIS — I2789 Other specified pulmonary heart diseases: Secondary | ICD-10-CM | POA: Diagnosis not present

## 2011-12-11 DIAGNOSIS — M159 Polyosteoarthritis, unspecified: Secondary | ICD-10-CM | POA: Diagnosis not present

## 2011-12-11 DIAGNOSIS — Z48812 Encounter for surgical aftercare following surgery on the circulatory system: Secondary | ICD-10-CM | POA: Diagnosis not present

## 2011-12-11 DIAGNOSIS — I10 Essential (primary) hypertension: Secondary | ICD-10-CM | POA: Diagnosis not present

## 2011-12-13 DIAGNOSIS — I2789 Other specified pulmonary heart diseases: Secondary | ICD-10-CM | POA: Diagnosis not present

## 2011-12-13 DIAGNOSIS — I10 Essential (primary) hypertension: Secondary | ICD-10-CM | POA: Diagnosis not present

## 2011-12-13 DIAGNOSIS — M159 Polyosteoarthritis, unspecified: Secondary | ICD-10-CM | POA: Diagnosis not present

## 2011-12-13 DIAGNOSIS — Z48812 Encounter for surgical aftercare following surgery on the circulatory system: Secondary | ICD-10-CM | POA: Diagnosis not present

## 2011-12-14 DIAGNOSIS — I2789 Other specified pulmonary heart diseases: Secondary | ICD-10-CM | POA: Diagnosis not present

## 2011-12-14 DIAGNOSIS — I10 Essential (primary) hypertension: Secondary | ICD-10-CM | POA: Diagnosis not present

## 2011-12-14 DIAGNOSIS — M159 Polyosteoarthritis, unspecified: Secondary | ICD-10-CM | POA: Diagnosis not present

## 2011-12-14 DIAGNOSIS — Z48812 Encounter for surgical aftercare following surgery on the circulatory system: Secondary | ICD-10-CM | POA: Diagnosis not present

## 2011-12-15 DIAGNOSIS — I2789 Other specified pulmonary heart diseases: Secondary | ICD-10-CM | POA: Diagnosis not present

## 2011-12-15 DIAGNOSIS — Z48812 Encounter for surgical aftercare following surgery on the circulatory system: Secondary | ICD-10-CM | POA: Diagnosis not present

## 2011-12-15 DIAGNOSIS — M159 Polyosteoarthritis, unspecified: Secondary | ICD-10-CM | POA: Diagnosis not present

## 2011-12-15 DIAGNOSIS — I10 Essential (primary) hypertension: Secondary | ICD-10-CM | POA: Diagnosis not present

## 2011-12-19 ENCOUNTER — Telehealth: Payer: Self-pay | Admitting: Cardiology

## 2011-12-19 ENCOUNTER — Encounter: Payer: Self-pay | Admitting: Internal Medicine

## 2011-12-19 ENCOUNTER — Ambulatory Visit (INDEPENDENT_AMBULATORY_CARE_PROVIDER_SITE_OTHER): Payer: Medicare Other | Admitting: *Deleted

## 2011-12-19 DIAGNOSIS — I4430 Unspecified atrioventricular block: Secondary | ICD-10-CM | POA: Diagnosis not present

## 2011-12-19 LAB — PACEMAKER DEVICE OBSERVATION
AL AMPLITUDE: 4 mv
BAMS-0001: 150 {beats}/min
RV LEAD AMPLITUDE: 8 mv
RV LEAD IMPEDENCE PM: 474 Ohm
RV LEAD THRESHOLD: 1 V

## 2011-12-19 NOTE — Telephone Encounter (Signed)
New Problem    Patient daughter [name] call regarding prescription for patient [facility].  Plz return call to [facility] @ [phone]

## 2011-12-19 NOTE — Progress Notes (Signed)
Wound check-PPM 

## 2011-12-19 NOTE — Telephone Encounter (Signed)
Requesting rx be refaxed which I did

## 2011-12-20 DIAGNOSIS — Z48812 Encounter for surgical aftercare following surgery on the circulatory system: Secondary | ICD-10-CM | POA: Diagnosis not present

## 2011-12-20 DIAGNOSIS — I2789 Other specified pulmonary heart diseases: Secondary | ICD-10-CM | POA: Diagnosis not present

## 2011-12-20 DIAGNOSIS — M159 Polyosteoarthritis, unspecified: Secondary | ICD-10-CM | POA: Diagnosis not present

## 2011-12-20 DIAGNOSIS — I10 Essential (primary) hypertension: Secondary | ICD-10-CM | POA: Diagnosis not present

## 2011-12-22 ENCOUNTER — Ambulatory Visit: Payer: Medicare Other | Admitting: *Deleted

## 2011-12-22 DIAGNOSIS — M159 Polyosteoarthritis, unspecified: Secondary | ICD-10-CM | POA: Diagnosis not present

## 2011-12-22 DIAGNOSIS — I10 Essential (primary) hypertension: Secondary | ICD-10-CM | POA: Diagnosis not present

## 2011-12-22 DIAGNOSIS — I2789 Other specified pulmonary heart diseases: Secondary | ICD-10-CM | POA: Diagnosis not present

## 2011-12-22 DIAGNOSIS — Z48812 Encounter for surgical aftercare following surgery on the circulatory system: Secondary | ICD-10-CM | POA: Diagnosis not present

## 2011-12-22 NOTE — Telephone Encounter (Signed)
New problem:  Per Inetta Fermo from [facility], need an order for tele - help to do weight, blood pressure, oxygen stats.

## 2011-12-22 NOTE — Telephone Encounter (Signed)
Ok per Dr Myrtis Ser.

## 2011-12-23 DIAGNOSIS — I10 Essential (primary) hypertension: Secondary | ICD-10-CM | POA: Diagnosis not present

## 2011-12-23 DIAGNOSIS — Z48812 Encounter for surgical aftercare following surgery on the circulatory system: Secondary | ICD-10-CM | POA: Diagnosis not present

## 2011-12-23 DIAGNOSIS — I2789 Other specified pulmonary heart diseases: Secondary | ICD-10-CM | POA: Diagnosis not present

## 2011-12-23 DIAGNOSIS — M159 Polyosteoarthritis, unspecified: Secondary | ICD-10-CM | POA: Diagnosis not present

## 2011-12-26 ENCOUNTER — Telehealth: Payer: Self-pay | Admitting: Cardiology

## 2011-12-26 NOTE — Telephone Encounter (Signed)
Daughter needs another note for her work and she has some other questions and concerns

## 2011-12-26 NOTE — Telephone Encounter (Signed)
[name] has a form that needs to be filled out and signed stating that she may return to work.   She also wants to know if she should have her mom come to her house to stay so that she can watch over her closer, even though it would be farther for her to go if she needs to be seen at our clinic.  She also states that [name] is getting leg cramps frequently.

## 2011-12-26 NOTE — Telephone Encounter (Signed)
[name] is complaining of leg cramps off and on.  She gets them most days.  She is trying to eat as much potassium rich foods as possible.  She is taking two potassium tabs daily.

## 2011-12-27 DIAGNOSIS — I2789 Other specified pulmonary heart diseases: Secondary | ICD-10-CM | POA: Diagnosis not present

## 2011-12-27 DIAGNOSIS — M159 Polyosteoarthritis, unspecified: Secondary | ICD-10-CM | POA: Diagnosis not present

## 2011-12-27 DIAGNOSIS — I10 Essential (primary) hypertension: Secondary | ICD-10-CM | POA: Diagnosis not present

## 2011-12-27 DIAGNOSIS — Z48812 Encounter for surgical aftercare following surgery on the circulatory system: Secondary | ICD-10-CM | POA: Diagnosis not present

## 2011-12-28 ENCOUNTER — Telehealth: Payer: Self-pay | Admitting: Cardiology

## 2011-12-28 DIAGNOSIS — I10 Essential (primary) hypertension: Secondary | ICD-10-CM | POA: Diagnosis not present

## 2011-12-28 DIAGNOSIS — I2789 Other specified pulmonary heart diseases: Secondary | ICD-10-CM | POA: Diagnosis not present

## 2011-12-28 DIAGNOSIS — M159 Polyosteoarthritis, unspecified: Secondary | ICD-10-CM | POA: Diagnosis not present

## 2011-12-28 DIAGNOSIS — Z48812 Encounter for surgical aftercare following surgery on the circulatory system: Secondary | ICD-10-CM | POA: Diagnosis not present

## 2011-12-28 NOTE — Telephone Encounter (Signed)
New problem [facility] wanted you to know they will continue to see her for 3 more weeks

## 2011-12-29 DIAGNOSIS — M159 Polyosteoarthritis, unspecified: Secondary | ICD-10-CM | POA: Diagnosis not present

## 2011-12-29 DIAGNOSIS — Z48812 Encounter for surgical aftercare following surgery on the circulatory system: Secondary | ICD-10-CM | POA: Diagnosis not present

## 2011-12-29 DIAGNOSIS — I10 Essential (primary) hypertension: Secondary | ICD-10-CM | POA: Diagnosis not present

## 2011-12-29 DIAGNOSIS — I2789 Other specified pulmonary heart diseases: Secondary | ICD-10-CM | POA: Diagnosis not present

## 2011-12-30 DIAGNOSIS — I2789 Other specified pulmonary heart diseases: Secondary | ICD-10-CM | POA: Diagnosis not present

## 2011-12-30 DIAGNOSIS — I10 Essential (primary) hypertension: Secondary | ICD-10-CM | POA: Diagnosis not present

## 2011-12-30 DIAGNOSIS — Z48812 Encounter for surgical aftercare following surgery on the circulatory system: Secondary | ICD-10-CM | POA: Diagnosis not present

## 2011-12-30 DIAGNOSIS — M159 Polyosteoarthritis, unspecified: Secondary | ICD-10-CM | POA: Diagnosis not present

## 2012-01-03 DIAGNOSIS — I2789 Other specified pulmonary heart diseases: Secondary | ICD-10-CM | POA: Diagnosis not present

## 2012-01-03 DIAGNOSIS — I10 Essential (primary) hypertension: Secondary | ICD-10-CM | POA: Diagnosis not present

## 2012-01-03 DIAGNOSIS — M159 Polyosteoarthritis, unspecified: Secondary | ICD-10-CM | POA: Diagnosis not present

## 2012-01-03 DIAGNOSIS — Z48812 Encounter for surgical aftercare following surgery on the circulatory system: Secondary | ICD-10-CM | POA: Diagnosis not present

## 2012-01-04 DIAGNOSIS — I2789 Other specified pulmonary heart diseases: Secondary | ICD-10-CM | POA: Diagnosis not present

## 2012-01-04 DIAGNOSIS — M159 Polyosteoarthritis, unspecified: Secondary | ICD-10-CM | POA: Diagnosis not present

## 2012-01-04 DIAGNOSIS — Z48812 Encounter for surgical aftercare following surgery on the circulatory system: Secondary | ICD-10-CM | POA: Diagnosis not present

## 2012-01-04 DIAGNOSIS — I10 Essential (primary) hypertension: Secondary | ICD-10-CM | POA: Diagnosis not present

## 2012-01-05 DIAGNOSIS — M159 Polyosteoarthritis, unspecified: Secondary | ICD-10-CM | POA: Diagnosis not present

## 2012-01-05 DIAGNOSIS — Z48812 Encounter for surgical aftercare following surgery on the circulatory system: Secondary | ICD-10-CM | POA: Diagnosis not present

## 2012-01-05 DIAGNOSIS — I2789 Other specified pulmonary heart diseases: Secondary | ICD-10-CM | POA: Diagnosis not present

## 2012-01-05 DIAGNOSIS — I10 Essential (primary) hypertension: Secondary | ICD-10-CM | POA: Diagnosis not present

## 2012-01-09 DIAGNOSIS — M159 Polyosteoarthritis, unspecified: Secondary | ICD-10-CM | POA: Diagnosis not present

## 2012-01-09 DIAGNOSIS — Z48812 Encounter for surgical aftercare following surgery on the circulatory system: Secondary | ICD-10-CM | POA: Diagnosis not present

## 2012-01-09 DIAGNOSIS — I10 Essential (primary) hypertension: Secondary | ICD-10-CM | POA: Diagnosis not present

## 2012-01-09 DIAGNOSIS — I2789 Other specified pulmonary heart diseases: Secondary | ICD-10-CM | POA: Diagnosis not present

## 2012-01-11 DIAGNOSIS — M159 Polyosteoarthritis, unspecified: Secondary | ICD-10-CM | POA: Diagnosis not present

## 2012-01-11 DIAGNOSIS — Z48812 Encounter for surgical aftercare following surgery on the circulatory system: Secondary | ICD-10-CM | POA: Diagnosis not present

## 2012-01-11 DIAGNOSIS — I2789 Other specified pulmonary heart diseases: Secondary | ICD-10-CM | POA: Diagnosis not present

## 2012-01-11 DIAGNOSIS — I10 Essential (primary) hypertension: Secondary | ICD-10-CM | POA: Diagnosis not present

## 2012-01-12 DIAGNOSIS — I10 Essential (primary) hypertension: Secondary | ICD-10-CM | POA: Diagnosis not present

## 2012-01-12 DIAGNOSIS — M159 Polyosteoarthritis, unspecified: Secondary | ICD-10-CM | POA: Diagnosis not present

## 2012-01-12 DIAGNOSIS — I2789 Other specified pulmonary heart diseases: Secondary | ICD-10-CM | POA: Diagnosis not present

## 2012-01-12 DIAGNOSIS — Z48812 Encounter for surgical aftercare following surgery on the circulatory system: Secondary | ICD-10-CM | POA: Diagnosis not present

## 2012-01-13 DIAGNOSIS — M76899 Other specified enthesopathies of unspecified lower limb, excluding foot: Secondary | ICD-10-CM | POA: Diagnosis not present

## 2012-01-13 DIAGNOSIS — I498 Other specified cardiac arrhythmias: Secondary | ICD-10-CM | POA: Diagnosis not present

## 2012-01-16 DIAGNOSIS — I2789 Other specified pulmonary heart diseases: Secondary | ICD-10-CM | POA: Diagnosis not present

## 2012-01-16 DIAGNOSIS — M159 Polyosteoarthritis, unspecified: Secondary | ICD-10-CM | POA: Diagnosis not present

## 2012-01-16 DIAGNOSIS — Z48812 Encounter for surgical aftercare following surgery on the circulatory system: Secondary | ICD-10-CM | POA: Diagnosis not present

## 2012-01-16 DIAGNOSIS — I10 Essential (primary) hypertension: Secondary | ICD-10-CM | POA: Diagnosis not present

## 2012-01-18 DIAGNOSIS — I2789 Other specified pulmonary heart diseases: Secondary | ICD-10-CM | POA: Diagnosis not present

## 2012-01-18 DIAGNOSIS — M159 Polyosteoarthritis, unspecified: Secondary | ICD-10-CM | POA: Diagnosis not present

## 2012-01-18 DIAGNOSIS — I10 Essential (primary) hypertension: Secondary | ICD-10-CM | POA: Diagnosis not present

## 2012-01-18 DIAGNOSIS — Z48812 Encounter for surgical aftercare following surgery on the circulatory system: Secondary | ICD-10-CM | POA: Diagnosis not present

## 2012-01-19 DIAGNOSIS — I2789 Other specified pulmonary heart diseases: Secondary | ICD-10-CM | POA: Diagnosis not present

## 2012-01-19 DIAGNOSIS — Z48812 Encounter for surgical aftercare following surgery on the circulatory system: Secondary | ICD-10-CM | POA: Diagnosis not present

## 2012-01-19 DIAGNOSIS — I10 Essential (primary) hypertension: Secondary | ICD-10-CM | POA: Diagnosis not present

## 2012-01-19 DIAGNOSIS — M159 Polyosteoarthritis, unspecified: Secondary | ICD-10-CM | POA: Diagnosis not present

## 2012-01-23 DIAGNOSIS — I10 Essential (primary) hypertension: Secondary | ICD-10-CM | POA: Diagnosis not present

## 2012-01-23 DIAGNOSIS — M159 Polyosteoarthritis, unspecified: Secondary | ICD-10-CM | POA: Diagnosis not present

## 2012-01-23 DIAGNOSIS — Z48812 Encounter for surgical aftercare following surgery on the circulatory system: Secondary | ICD-10-CM | POA: Diagnosis not present

## 2012-01-23 DIAGNOSIS — I2789 Other specified pulmonary heart diseases: Secondary | ICD-10-CM | POA: Diagnosis not present

## 2012-01-25 DIAGNOSIS — Z48812 Encounter for surgical aftercare following surgery on the circulatory system: Secondary | ICD-10-CM | POA: Diagnosis not present

## 2012-01-25 DIAGNOSIS — I2789 Other specified pulmonary heart diseases: Secondary | ICD-10-CM | POA: Diagnosis not present

## 2012-01-25 DIAGNOSIS — M159 Polyosteoarthritis, unspecified: Secondary | ICD-10-CM | POA: Diagnosis not present

## 2012-01-25 DIAGNOSIS — I10 Essential (primary) hypertension: Secondary | ICD-10-CM | POA: Diagnosis not present

## 2012-01-27 DIAGNOSIS — M159 Polyosteoarthritis, unspecified: Secondary | ICD-10-CM | POA: Diagnosis not present

## 2012-01-27 DIAGNOSIS — Z48812 Encounter for surgical aftercare following surgery on the circulatory system: Secondary | ICD-10-CM | POA: Diagnosis not present

## 2012-01-27 DIAGNOSIS — I10 Essential (primary) hypertension: Secondary | ICD-10-CM | POA: Diagnosis not present

## 2012-01-27 DIAGNOSIS — I2789 Other specified pulmonary heart diseases: Secondary | ICD-10-CM | POA: Diagnosis not present

## 2012-01-31 ENCOUNTER — Other Ambulatory Visit: Payer: Self-pay

## 2012-01-31 ENCOUNTER — Telehealth: Payer: Self-pay | Admitting: Cardiology

## 2012-01-31 ENCOUNTER — Ambulatory Visit: Payer: Medicare Other | Admitting: Cardiology

## 2012-01-31 DIAGNOSIS — R252 Cramp and spasm: Secondary | ICD-10-CM

## 2012-01-31 DIAGNOSIS — Z48812 Encounter for surgical aftercare following surgery on the circulatory system: Secondary | ICD-10-CM | POA: Diagnosis not present

## 2012-01-31 DIAGNOSIS — I2789 Other specified pulmonary heart diseases: Secondary | ICD-10-CM | POA: Diagnosis not present

## 2012-01-31 DIAGNOSIS — I10 Essential (primary) hypertension: Secondary | ICD-10-CM | POA: Diagnosis not present

## 2012-01-31 DIAGNOSIS — M159 Polyosteoarthritis, unspecified: Secondary | ICD-10-CM | POA: Diagnosis not present

## 2012-01-31 NOTE — Telephone Encounter (Signed)
Pt to get bmet done per Dr Myrtis Ser.  She was notified and states she needs to find a ride.  BMET was ordered.  Pt was reminded to call us when her weight goes up 2# or more.

## 2012-01-31 NOTE — Telephone Encounter (Signed)
Please return call to patient daughter Mykia Holton [phone]  Patient daughter says patient is having really bad cramp in there legs.  Please return call to patient daughter [name] to discuss med concerns.

## 2012-01-31 NOTE — Telephone Encounter (Signed)
[name] is reporting increased (intensity) leg cramps.  She states they seem to be worse when she voids more during the day.  She states she is taking 80mg  bid.  She states she takes the zaroxolyn three times and the leg cramps seem to come more often on those days.  Her potassium was high and she was told to decrease her Potassium to daily instead of the bid she was taking.  She states she doesn't have much swelling.  She states her weight has been fairly stable recently but does go up and down off and on 1-2#.

## 2012-02-02 DIAGNOSIS — I2789 Other specified pulmonary heart diseases: Secondary | ICD-10-CM | POA: Diagnosis not present

## 2012-02-02 DIAGNOSIS — I10 Essential (primary) hypertension: Secondary | ICD-10-CM | POA: Diagnosis not present

## 2012-02-02 DIAGNOSIS — M159 Polyosteoarthritis, unspecified: Secondary | ICD-10-CM | POA: Diagnosis not present

## 2012-02-02 DIAGNOSIS — Z48812 Encounter for surgical aftercare following surgery on the circulatory system: Secondary | ICD-10-CM | POA: Diagnosis not present

## 2012-02-06 DIAGNOSIS — Z48812 Encounter for surgical aftercare following surgery on the circulatory system: Secondary | ICD-10-CM | POA: Diagnosis not present

## 2012-02-06 DIAGNOSIS — M159 Polyosteoarthritis, unspecified: Secondary | ICD-10-CM | POA: Diagnosis not present

## 2012-02-06 DIAGNOSIS — I2789 Other specified pulmonary heart diseases: Secondary | ICD-10-CM | POA: Diagnosis not present

## 2012-02-06 DIAGNOSIS — I10 Essential (primary) hypertension: Secondary | ICD-10-CM | POA: Diagnosis not present

## 2012-02-07 ENCOUNTER — Other Ambulatory Visit (INDEPENDENT_AMBULATORY_CARE_PROVIDER_SITE_OTHER): Payer: Medicare Other

## 2012-02-07 ENCOUNTER — Ambulatory Visit: Payer: Medicare Other | Admitting: Cardiology

## 2012-02-07 DIAGNOSIS — R252 Cramp and spasm: Secondary | ICD-10-CM | POA: Diagnosis not present

## 2012-02-07 LAB — BASIC METABOLIC PANEL
BUN: 46 mg/dL — ABNORMAL HIGH (ref 6–23)
Creatinine, Ser: 1.4 mg/dL — ABNORMAL HIGH (ref 0.4–1.2)
GFR: 45.25 mL/min — ABNORMAL LOW (ref >60.00)
Glucose, Bld: 142 mg/dL — ABNORMAL HIGH (ref 70–99)
Potassium: 3.6 mEq/L (ref 3.5–5.1)

## 2012-02-09 DIAGNOSIS — I10 Essential (primary) hypertension: Secondary | ICD-10-CM | POA: Diagnosis not present

## 2012-02-09 DIAGNOSIS — M159 Polyosteoarthritis, unspecified: Secondary | ICD-10-CM | POA: Diagnosis not present

## 2012-02-09 DIAGNOSIS — Z48812 Encounter for surgical aftercare following surgery on the circulatory system: Secondary | ICD-10-CM | POA: Diagnosis not present

## 2012-02-09 DIAGNOSIS — I2789 Other specified pulmonary heart diseases: Secondary | ICD-10-CM | POA: Diagnosis not present

## 2012-02-10 ENCOUNTER — Telehealth: Payer: Self-pay | Admitting: Cardiology

## 2012-02-10 NOTE — Telephone Encounter (Signed)
[name] was notified that [name] is to decrease zaroxolyn to twice weekly per Dr Graciela Husbands (dod).

## 2012-02-10 NOTE — Telephone Encounter (Signed)
Please return call to Daughter- Rosezella [place] [phone]  Patient daughter f/u on nurse call to patient, as patient did not understand.  She can be reached at [phone]

## 2012-02-13 ENCOUNTER — Encounter: Payer: Medicare Other | Admitting: *Deleted

## 2012-02-14 DIAGNOSIS — Z48812 Encounter for surgical aftercare following surgery on the circulatory system: Secondary | ICD-10-CM | POA: Diagnosis not present

## 2012-02-14 DIAGNOSIS — M159 Polyosteoarthritis, unspecified: Secondary | ICD-10-CM | POA: Diagnosis not present

## 2012-02-14 DIAGNOSIS — I10 Essential (primary) hypertension: Secondary | ICD-10-CM | POA: Diagnosis not present

## 2012-02-14 DIAGNOSIS — I2789 Other specified pulmonary heart diseases: Secondary | ICD-10-CM | POA: Diagnosis not present

## 2012-02-15 ENCOUNTER — Ambulatory Visit (INDEPENDENT_AMBULATORY_CARE_PROVIDER_SITE_OTHER): Payer: Medicare Other | Admitting: Internal Medicine

## 2012-02-15 ENCOUNTER — Other Ambulatory Visit: Payer: Self-pay

## 2012-02-15 ENCOUNTER — Encounter: Payer: Self-pay | Admitting: Internal Medicine

## 2012-02-15 VITALS — BP 138/60 | HR 86

## 2012-02-15 DIAGNOSIS — I4430 Unspecified atrioventricular block: Secondary | ICD-10-CM

## 2012-02-15 DIAGNOSIS — Z950 Presence of cardiac pacemaker: Secondary | ICD-10-CM | POA: Diagnosis not present

## 2012-02-15 DIAGNOSIS — R609 Edema, unspecified: Secondary | ICD-10-CM | POA: Diagnosis not present

## 2012-02-15 LAB — PACEMAKER DEVICE OBSERVATION
AL IMPEDENCE PM: 456 Ohm
AL THRESHOLD: 0.75 V
ATRIAL PACING PM: 3
BATTERY VOLTAGE: 2.79 V
RV LEAD AMPLITUDE: 8 mv
RV LEAD IMPEDENCE PM: 449 Ohm

## 2012-02-15 MED ORDER — POTASSIUM CHLORIDE CRYS ER 20 MEQ PO TBCR: EXTENDED_RELEASE_TABLET | ORAL | Status: DC

## 2012-02-15 NOTE — Assessment & Plan Note (Signed)
Her pacemaker is working normally. We'll plan to recheck in several months. 

## 2012-02-15 NOTE — Progress Notes (Signed)
HPI [name] returns today for followup. She is a very pleasant 78 year old woman with symptomatic 2:1 heart block who underwent pacemaker insertion several months ago. She returns today for followup. She has done well. Her shortness of breath is improved as has her peripheral edema. She states she has more energy than she used to. She admits to some dietary indiscretion with sodium. Allergies  Allergen Reactions  . Aspirin     REACTION: rash     Current Outpatient Prescriptions  Medication Sig Dispense Refill  . Cholecalciferol (VITAMIN D3) 1000 UNITS CAPS Take 1 capsule by mouth daily.        . cyclobenzaprine (FLEXERIL) 5 MG tablet Take 5-10 mg by mouth 2 (two) times daily as needed. For muscle spasm.      . diazepam (VALIUM) 5 MG tablet Take 5 mg by mouth 2 (two) times daily as needed. For anxiety.      . furosemide (LASIX) 80 MG tablet TAKE ONE TABLET BY MOUTH TWICE DAILY  60 tablet  6  . HYDROcodone-acetaminophen (LORTAB) 10-500 MG per tablet Take 1 tablet by mouth every 4 (four) hours as needed. For pain.      . meloxicam (MOBIC) 15 MG tablet Take 15 mg by mouth daily.        . metolazone (ZAROXOLYN) 2.5 MG tablet Take 2.5 mg by mouth 3 (three) times a week. Takes 2.5mg  Tuesdays, Thursdays, Sundays      . Multiple Vitamins-Minerals (MULTIVITAMINS THER. W/MINERALS) TABS Take 1 tablet by mouth daily.        . potassium chloride SA (K-DUR,KLOR-CON) 20 MEQ tablet Take 2 tabs by mouth in the am and 1 tab in the pm  90 tablet  11  . DISCONTD: potassium chloride SA (K-DUR,KLOR-CON) 20 MEQ tablet Take 20 mEq by mouth 2 (two) times daily.           Past Medical History  Diagnosis Date  . Edema     . EF 65-70%, echo, November 29, 2010  . Pulmonary hypertension      46  mmHg... echo... January, 2012  . Cellulitis     Superficial cellulitis right lower leg   January 14, 2011  . AV block      2:1  January 14, 2011 / pacemaker plan when cellulitis resolved in length  . Bradycardia    2:1  AV block  . Hypokalemia     October, 2012  . Ejection fraction     EF 65%, echo, January, 2012  . Murmur     No significant valvular disease by echo, January, 2012    ROS:   All systems reviewed and negative except as noted in the HPI.   Past Surgical History  Procedure Date  . Partial hysterectomy   . Hip arthroplasty     total     Family History  Problem Relation Age of Onset  . Cardiomyopathy Father   . Diabetes Father   . Cancer       son  . Coronary artery disease      son     History   Social History  . Marital Status: Widowed    Spouse Name: N/A    Number of Children: N/A  . Years of Education: N/A   Occupational History  . Retired    Social History Main Topics  . Smoking status: Never Smoker   . Smokeless tobacco: Not on file  . Alcohol Use: No  . Drug Use: No  . Sexually  Active: Not on file   Other Topics Concern  . Not on file   Social History Narrative   Retired Widowed Tobacco Use - No. Alcohol Use - noRegular Exercise - noDrug Use - no     BP 138/60  Pulse 86  Physical Exam:  Well appearing 78 year old woman, NAD HEENT: Unremarkable Neck:  No JVD, no thyromegally Lungs:  Clear with no wheezes, rales, or rhonchi. Well-healed pacemaker incision. HEART:  Regular rate rhythm, no murmurs, no rubs, no clicks Abd:  soft, positive bowel sounds, no organomegally, no rebound, no guarding Ext:  2 plus pulses, no edema, no cyanosis, no clubbing Skin:  No rashes no nodules Neuro:  CN II through XII intact, motor grossly intact  DEVICE  Normal device function.  See PaceArt for details.   Assess/Plan:

## 2012-02-15 NOTE — Patient Instructions (Signed)
Your physician wants you to follow-up in: 11/2012 with Dr Court Joy will receive a reminder letter in the mail two months in advance. If you don't receive a letter, please call our office to schedule the follow-up appointment.

## 2012-02-15 NOTE — Assessment & Plan Note (Signed)
Her peripheral edema and shortness of breath of both improved. I suspect the increase in cardiac output after pacemaker insertion has helped these.

## 2012-02-16 DIAGNOSIS — I10 Essential (primary) hypertension: Secondary | ICD-10-CM | POA: Diagnosis not present

## 2012-02-16 DIAGNOSIS — M159 Polyosteoarthritis, unspecified: Secondary | ICD-10-CM | POA: Diagnosis not present

## 2012-02-16 DIAGNOSIS — I2789 Other specified pulmonary heart diseases: Secondary | ICD-10-CM | POA: Diagnosis not present

## 2012-02-16 DIAGNOSIS — Z48812 Encounter for surgical aftercare following surgery on the circulatory system: Secondary | ICD-10-CM | POA: Diagnosis not present

## 2012-02-17 DIAGNOSIS — Z48812 Encounter for surgical aftercare following surgery on the circulatory system: Secondary | ICD-10-CM | POA: Diagnosis not present

## 2012-02-17 DIAGNOSIS — M159 Polyosteoarthritis, unspecified: Secondary | ICD-10-CM | POA: Diagnosis not present

## 2012-02-17 DIAGNOSIS — I10 Essential (primary) hypertension: Secondary | ICD-10-CM | POA: Diagnosis not present

## 2012-02-17 DIAGNOSIS — I2789 Other specified pulmonary heart diseases: Secondary | ICD-10-CM | POA: Diagnosis not present

## 2012-02-21 ENCOUNTER — Telehealth: Payer: Self-pay | Admitting: Cardiology

## 2012-02-21 DIAGNOSIS — I10 Essential (primary) hypertension: Secondary | ICD-10-CM | POA: Diagnosis not present

## 2012-02-21 DIAGNOSIS — Z48812 Encounter for surgical aftercare following surgery on the circulatory system: Secondary | ICD-10-CM | POA: Diagnosis not present

## 2012-02-21 DIAGNOSIS — M159 Polyosteoarthritis, unspecified: Secondary | ICD-10-CM | POA: Diagnosis not present

## 2012-02-21 DIAGNOSIS — I2789 Other specified pulmonary heart diseases: Secondary | ICD-10-CM | POA: Diagnosis not present

## 2012-02-21 NOTE — Telephone Encounter (Signed)
Please return call to Patient Daughter  Kayleena EkeLillia Mountain  [phone]   Patient turned in a document  For wheel chair reimbursement  But the document was never received by patient. Please return this call to Daughter Iyanbito regarding the matter.

## 2012-02-21 NOTE — Telephone Encounter (Signed)
DAUGHTER AWARE  WILL FORWARD MESSAGE TO DEBBIE LEFLER LPN FOR REVIEW .Zack Seal

## 2012-02-22 NOTE — Telephone Encounter (Signed)
N/A.  LMTC. 

## 2012-02-24 NOTE — Telephone Encounter (Signed)
New form will be faxed for Dr Myrtis Ser to sign per [facility]

## 2012-02-29 DIAGNOSIS — I2789 Other specified pulmonary heart diseases: Secondary | ICD-10-CM | POA: Diagnosis not present

## 2012-02-29 DIAGNOSIS — M159 Polyosteoarthritis, unspecified: Secondary | ICD-10-CM | POA: Diagnosis not present

## 2012-02-29 DIAGNOSIS — I10 Essential (primary) hypertension: Secondary | ICD-10-CM | POA: Diagnosis not present

## 2012-02-29 DIAGNOSIS — Z48812 Encounter for surgical aftercare following surgery on the circulatory system: Secondary | ICD-10-CM | POA: Diagnosis not present

## 2012-03-02 NOTE — Telephone Encounter (Signed)
Form signed by Dr Myrtis Ser and mailed back to [facility]

## 2012-03-08 DIAGNOSIS — I2789 Other specified pulmonary heart diseases: Secondary | ICD-10-CM | POA: Diagnosis not present

## 2012-03-08 DIAGNOSIS — M159 Polyosteoarthritis, unspecified: Secondary | ICD-10-CM | POA: Diagnosis not present

## 2012-03-08 DIAGNOSIS — Z48812 Encounter for surgical aftercare following surgery on the circulatory system: Secondary | ICD-10-CM | POA: Diagnosis not present

## 2012-03-08 DIAGNOSIS — I10 Essential (primary) hypertension: Secondary | ICD-10-CM | POA: Diagnosis not present

## 2012-03-19 ENCOUNTER — Encounter: Payer: Self-pay | Admitting: Cardiology

## 2012-03-19 DIAGNOSIS — Z950 Presence of cardiac pacemaker: Secondary | ICD-10-CM | POA: Insufficient documentation

## 2012-03-20 ENCOUNTER — Encounter: Payer: Self-pay | Admitting: Cardiology

## 2012-03-20 ENCOUNTER — Ambulatory Visit (INDEPENDENT_AMBULATORY_CARE_PROVIDER_SITE_OTHER): Payer: Medicare Other | Admitting: Cardiology

## 2012-03-20 VITALS — BP 132/54 | HR 85 | Ht 60.0 in | Wt 176.0 lb

## 2012-03-20 DIAGNOSIS — Z950 Presence of cardiac pacemaker: Secondary | ICD-10-CM

## 2012-03-20 DIAGNOSIS — L0390 Cellulitis, unspecified: Secondary | ICD-10-CM | POA: Diagnosis not present

## 2012-03-20 DIAGNOSIS — R609 Edema, unspecified: Secondary | ICD-10-CM | POA: Diagnosis not present

## 2012-03-20 DIAGNOSIS — L0291 Cutaneous abscess, unspecified: Secondary | ICD-10-CM | POA: Diagnosis not present

## 2012-03-20 DIAGNOSIS — E876 Hypokalemia: Secondary | ICD-10-CM

## 2012-03-20 NOTE — Assessment & Plan Note (Signed)
The patient still has edema. However it is not tense and there is no erythema. We probably will not do any better than her current regimen. No change in her meds.

## 2012-03-20 NOTE — Patient Instructions (Signed)
Your physician recommends that you return for lab work today (bmet)  Your physician recommends that you schedule a follow-up appointment in: 3 months.  Take your zaroxolyn for three days in a row, (Wed, Thurs, Fri), weigh yourself every day and call Debby Zackerie Sara at [phone] on Friday and let us know what your weights are and how you are feeling.

## 2012-03-20 NOTE — Progress Notes (Signed)
HPI Patient returns today for followup of her fluid status and her rhythm. She looks great today. She is here with her son. I saw her last in January, 2013. We had worked for a long time on stabilize and her volume status and being sure that she had no active cellulitis in her legs. She then received a permanent pacemaker. This went well. It is nicely healed. She has seen Dr. Ladona Ridgel in followup. He thinks that she has benefited from the pacemaker. She says that she does feel better overall. This does not control the volume in her legs. However she has greater exercise tolerance and overall she's doing very well and feeling well.  Allergies  Allergen Reactions  . Aspirin     REACTION: rash    Current Outpatient Prescriptions  Medication Sig Dispense Refill  . Cholecalciferol (VITAMIN D3) 1000 UNITS CAPS Take 1 capsule by mouth daily.        . cyclobenzaprine (FLEXERIL) 5 MG tablet Take 5-10 mg by mouth 2 (two) times daily as needed. For muscle spasm.      . diazepam (VALIUM) 5 MG tablet Take 5 mg by mouth 2 (two) times daily as needed. For anxiety.      . furosemide (LASIX) 80 MG tablet TAKE ONE TABLET BY MOUTH TWICE DAILY  60 tablet  6  . HYDROcodone-acetaminophen (LORTAB) 10-500 MG per tablet Take 1 tablet by mouth every 4 (four) hours as needed. For pain.      . meloxicam (MOBIC) 15 MG tablet Take 15 mg by mouth daily.        . metolazone (ZAROXOLYN) 2.5 MG tablet Take 2.5 mg by mouth 3 (three) times a week. Takes 2.5mg  Tuesdays, Thursdays, Sundays      . Multiple Vitamins-Minerals (MULTIVITAMINS THER. W/MINERALS) TABS Take 1 tablet by mouth daily.        . potassium chloride SA (K-DUR,KLOR-CON) 20 MEQ tablet Take 2 tabs by mouth in the am and 1 tab in the pm  90 tablet  11    History   Social History  . Marital Status: Widowed    Spouse Name: N/A    Number of Children: N/A  . Years of Education: N/A   Occupational History  . Retired    Social History Main Topics  . Smoking  status: Never Smoker   . Smokeless tobacco: Not on file  . Alcohol Use: No  . Drug Use: No  . Sexually Active: Not on file   Other Topics Concern  . Not on file   Social History Narrative   Retired Widowed Tobacco Use - No. Alcohol Use - noRegular Exercise - noDrug Use - no    Family History  Problem Relation Age of Onset  . Cardiomyopathy Father   . Diabetes Father   . Cancer       son  . Coronary artery disease      son    Past Medical History  Diagnosis Date  . Edema     . EF 65-70%, echo, November 29, 2010  . Pulmonary hypertension      46  mmHg... echo... January, 2012  . Cellulitis     Superficial cellulitis right lower leg   January 14, 2011  . AV block      2:1  January 14, 2011 / pacemaker plan when cellulitis resolved in length  . Bradycardia     2:1  AV block  . Hypokalemia     October, 2012  . Ejection  fraction     EF 65%, echo, January, 2012  . Murmur     No significant valvular disease by echo, January, 2012  . Pacemaker     January, 2013    Past Surgical History  Procedure Date  . Partial hysterectomy   . Hip arthroplasty     total    ROS  Patient denies fever, chills, headache, sweats, rash, change in vision, change in hearing, chest pain, cough, nausea vomiting, urinary symptoms. All other systems are reviewed and are negative.  PHYSICAL EXAM Patient is oriented to person time and place. Affect is normal. She's here with her son. She is in a wheelchair. Lungs are clear. Respiratory effort is nonlabored. She is significantly overweight. Cardiac exam reveals S1 and S2. There no clicks or significant murmurs. The abdomen is obese but soft. She does have peripheral edema but it is not as tense as the past. There is no erythema.  Filed Vitals:   03/20/12 1540  BP: 132/54  Pulse: 85  Height: 5' (1.524 m)  Weight: 176 lb (79.833 kg)     ASSESSMENT & PLAN

## 2012-03-20 NOTE — Assessment & Plan Note (Signed)
Her pacemaker is in place. She's doing well.

## 2012-03-20 NOTE — Assessment & Plan Note (Signed)
She is no cellulitis at this time.

## 2012-03-20 NOTE — Assessment & Plan Note (Signed)
Intermittently she's had hypokalemia. Potassium will be checked today.

## 2012-03-21 LAB — BASIC METABOLIC PANEL
CO2: 32 mEq/L (ref 19–32)
Chloride: 103 mEq/L (ref 96–112)
Glucose, Bld: 84 mg/dL (ref 70–99)
Sodium: 141 mEq/L (ref 135–145)

## 2012-03-23 ENCOUNTER — Telehealth: Payer: Self-pay | Admitting: Cardiology

## 2012-03-23 NOTE — Telephone Encounter (Signed)
Weight on 03/21/12=175.2#                   03/22/12=173.8#                   03/23/12=170.0# Only slight swelling in le and no sob per pt

## 2012-03-23 NOTE — Telephone Encounter (Signed)
[name], Mrs Perleberg's daughter, was notified of plan.

## 2012-03-23 NOTE — Telephone Encounter (Signed)
Pt had some testing done and daughter is calling to get results

## 2012-03-26 NOTE — Telephone Encounter (Signed)
Dr Myrtis Ser was notified of info.

## 2012-03-26 NOTE — Telephone Encounter (Signed)
Weight down to 160.7# today.  Pt will decrease zaroxolyn to 3days/week.

## 2012-03-29 ENCOUNTER — Telehealth: Payer: Self-pay | Admitting: Cardiology

## 2012-03-29 NOTE — Telephone Encounter (Signed)
[name] states her weight is up to 168.0# today.   She thinks the redness is improving since I talked to her earlier today.  Per Dr Myrtis Ser, pt to take a dose of Zaroxolyn tonight.  Triage nurse to call tomorrow after 11:30 am to get weight tomorrow and find out how her redness and "tightness" is doing.  Then let him know by text or call tomorrow.

## 2012-03-29 NOTE — Telephone Encounter (Signed)
[name] is feeling like she might be getting cellulitis in her right leg.  She states it is tingling and burning.  She has redness in right lower leg.  She states it is hard to the touch but not as bad as it has been.  She states it feels warm to the touch.  Symptoms x 2 days.  She has not weighed yet today but will before I call her back.  She states her weight yesterday was down 3#.

## 2012-03-29 NOTE — Telephone Encounter (Signed)
Pt right leg is burning and stinging and they want to get and antibiotic like she got last time

## 2012-03-30 NOTE — Telephone Encounter (Signed)
I called the patient to follow up with her weight for today. As of now, the patient's CMA has not come in to weigh her. She will call us back with her weight.

## 2012-03-30 NOTE — Telephone Encounter (Signed)
I called and spoke with the patient. She states his weight is 168.4 lbs today. She states her edema is improving and her breathing is good today. I informed her I would notify Dr. Myrtis Ser and call her back with his recommendations. The patient is agreeable. Message sent to Dr. Myrtis Ser.

## 2012-03-30 NOTE — Telephone Encounter (Signed)
Per Dr. Myrtis Ser, take zaroxalyn today, then none over the weekend. Call Monday with weights. The patient is aware and verbalizes understanding. She will continue her lasix as prescribed. I will forward to [place] as an [place].

## 2012-04-02 NOTE — Telephone Encounter (Signed)
Weight is 169.4# today and was 172.4# yesterday.  Swelling is better, no sob per pt.  She was told per Dr Myrtis Ser to increase the zaroxolyn to three times per week and to continue to call her weights to [place] daily.  She agrees.

## 2012-04-03 NOTE — Telephone Encounter (Signed)
Weight is 168# today.  No change in small amount of swelling.  No sob.  Baseline weight is 167.6.  She will continue to monitor daily weights, take Zaroxolyn three times weekly and I will call her tomorrow.

## 2012-04-04 NOTE — Telephone Encounter (Signed)
Follow up on previous call.  Patient calling for weight today. 169.6.

## 2012-04-09 ENCOUNTER — Telehealth: Payer: Self-pay | Admitting: *Deleted

## 2012-04-09 NOTE — Telephone Encounter (Signed)
Weight 04/07/12 167 lb, 04/09/12 169.4 lb. Denies sob/ no additional edema noted.

## 2012-04-10 NOTE — Telephone Encounter (Signed)
Continue same plan in color on Thursday

## 2012-04-10 NOTE — Telephone Encounter (Signed)
noted 

## 2012-04-12 ENCOUNTER — Telehealth: Payer: Self-pay | Admitting: *Deleted

## 2012-04-12 NOTE — Telephone Encounter (Signed)
Wt 162 lb down 6 lb 8 oz, less edema no sob, pt stated she had been drinking gator aid, explained it is high in sodium and she cant have. Pt verbalized understanding.

## 2012-04-12 NOTE — Telephone Encounter (Signed)
If her weight drops to 160 or lower, she should not take Zaroxolyn if it is scheduled on the day that the weight is at low

## 2012-04-12 NOTE — Telephone Encounter (Signed)
msg left with medication reminder on how to take zaroxolyn.

## 2012-04-13 ENCOUNTER — Other Ambulatory Visit: Payer: Self-pay | Admitting: Cardiology

## 2012-04-13 NOTE — Telephone Encounter (Signed)
..   Requested Prescriptions   Pending Prescriptions Disp Refills  . furosemide (LASIX) 80 MG tablet [Pharmacy Med Name: FUROSEMIDE 80MG      TAB MYLA] 60 tablet 10    Sig: TAKE ONE TABLET BY MOUTH TWICE DAILY

## 2012-05-14 ENCOUNTER — Telehealth: Payer: Self-pay | Admitting: Cardiology

## 2012-05-14 NOTE — Telephone Encounter (Signed)
Patient called to give Dr. Myrtis Ser her weight 163.6 lbs.  Patient also wanted Claudette Stapler  To know her daughter Minus Liberty came through surgery just fine today!!! :0)

## 2012-05-14 NOTE — Telephone Encounter (Signed)
Noted and recorded

## 2012-05-23 ENCOUNTER — Telehealth: Payer: Self-pay | Admitting: Cardiology

## 2012-05-23 NOTE — Telephone Encounter (Signed)
Noted  

## 2012-05-23 NOTE — Telephone Encounter (Signed)
New msg Pt called with her weight which is 162.0 .

## 2012-05-29 ENCOUNTER — Telehealth: Payer: Self-pay

## 2012-05-29 NOTE — Telephone Encounter (Signed)
Weight today is 165.8.  No increased edema or sob.  She states she took zaroxolyn and has been voiding a lot today.  Dr Myrtis Ser was notified and I will call her tomorrow.

## 2012-05-29 NOTE — Telephone Encounter (Signed)
Pt was called for weight status.  She states her phone yesterday was out of order but her weight yesterday was 162.0#.  Edema and sob are doing well today and yesterday.  She states she will call after she weighs today.

## 2012-05-30 NOTE — Telephone Encounter (Signed)
[name] states her weight today is 165.0.  No increased edema or sob.

## 2012-05-31 NOTE — Telephone Encounter (Signed)
Pt states she is down to 164.1# today.  No inc edema or sob.  Feeling well.

## 2012-06-05 NOTE — Telephone Encounter (Signed)
Pt was called regarding her weight and status report.  She states she feels "pretty good", her weight is down to 162.0#, edema and sob the same as last week.

## 2012-06-06 NOTE — Telephone Encounter (Signed)
[name] reports that her weight is 163.0# today.  She states her swelling is the same (slight) and her sob is the same (slight with exertion).

## 2012-06-08 NOTE — Telephone Encounter (Signed)
[name] states her weight is 163.0# today.  She states she has slight swelling in her left ankle and no sob.

## 2012-06-12 NOTE — Telephone Encounter (Signed)
[name] reports that her weight is 163.0# today.  Edema better and sob is better.

## 2012-06-14 NOTE — Telephone Encounter (Signed)
Weight up to 168.4 yesterday and 168.6 today.  Pt states she drank gatorade.  No edema, no sob, feels well.  She was told to take Zaroxolyn 2.5mg  x 2 days along with one of her lasix doses (lasix is 80mg  bid).  She states she understands.

## 2012-06-15 NOTE — Telephone Encounter (Signed)
Pt states she is down 11# after taking Zaroxolyn for two days.  Ok to hold zaroxolyn until Monday per Dr Myrtis Ser.  She states she feels good.  No sob and no swelling.

## 2012-06-20 NOTE — Telephone Encounter (Signed)
Mrs Rodocker states her weight is 168.6 today.  Slightly more edema in right ankle.  Scheduled to take her zaroxolyn today.

## 2012-06-21 ENCOUNTER — Telehealth: Payer: Self-pay | Admitting: Cardiology

## 2012-06-21 NOTE — Telephone Encounter (Signed)
New msg Pt's daughter wanted to talk to you about her legs. Please call

## 2012-06-21 NOTE — Telephone Encounter (Signed)
Mrs Hendricksen states her weight is down to 164.0# today.  No sob and no edema.  She states her legs are still cramping in the calf at night.  She states she feels much better than she did yesterday.

## 2012-06-26 NOTE — Telephone Encounter (Signed)
Mrs Astarita daughter called concerned about her leg cramps.  Will have Dr Myrtis Ser address on Thursday.  Pt states her leg cramps are off and on, no worse than they have been.

## 2012-06-26 NOTE — Telephone Encounter (Signed)
[name] reports that her weight is 162.6# today, down from 163.2 on Friday.  She states she has slightly more edema but no sob.  Leg cramps are better today.

## 2012-06-27 NOTE — Telephone Encounter (Signed)
[name] states her weight is 164# today.  She took her zaroxolyn late this am and has been voiding since.  Edema is the same and no sob.  She continues to have leg cramps.  She was reminded of her appt with Dr Myrtis Ser tomorrow.

## 2012-06-28 ENCOUNTER — Ambulatory Visit (INDEPENDENT_AMBULATORY_CARE_PROVIDER_SITE_OTHER): Payer: Medicare Other | Admitting: Cardiology

## 2012-06-28 ENCOUNTER — Encounter: Payer: Self-pay | Admitting: Cardiology

## 2012-06-28 VITALS — BP 114/64 | HR 84 | Ht <= 58 in | Wt 174.0 lb

## 2012-06-28 DIAGNOSIS — R609 Edema, unspecified: Secondary | ICD-10-CM

## 2012-06-28 DIAGNOSIS — Z950 Presence of cardiac pacemaker: Secondary | ICD-10-CM | POA: Diagnosis not present

## 2012-06-28 DIAGNOSIS — E876 Hypokalemia: Secondary | ICD-10-CM

## 2012-06-28 DIAGNOSIS — I10 Essential (primary) hypertension: Secondary | ICD-10-CM

## 2012-06-28 DIAGNOSIS — I4430 Unspecified atrioventricular block: Secondary | ICD-10-CM | POA: Diagnosis not present

## 2012-06-28 DIAGNOSIS — L0390 Cellulitis, unspecified: Secondary | ICD-10-CM

## 2012-06-28 LAB — BASIC METABOLIC PANEL
BUN: 38 mg/dL — ABNORMAL HIGH (ref 6–23)
Chloride: 98 mEq/L (ref 96–112)
Creatinine, Ser: 1.2 mg/dL (ref 0.4–1.2)
Glucose, Bld: 88 mg/dL (ref 70–99)
Potassium: 3.2 mEq/L — ABNORMAL LOW (ref 3.5–5.1)

## 2012-06-28 NOTE — Assessment & Plan Note (Signed)
Chemistry will be checked today to be sure that she has normal potassium.

## 2012-06-28 NOTE — Patient Instructions (Addendum)
Your physician recommends that you return for lab work in: today (bmet)  Your physician recommends that you schedule a follow-up appointment in: 3 months  (Call Debby at [phone] and let me know if you want 11/6, 11/21, 11/25 and what time is best).

## 2012-06-28 NOTE — Assessment & Plan Note (Signed)
She does not have any signs of cellulitis at this time.

## 2012-06-28 NOTE — Progress Notes (Signed)
HPI Patient is seen back in followup her problem with fluid overload.Hubert [name], my nurse has called the patient several days a week on an ongoing basis. We carefully monitor her fluid status at home. We have record of her reported home weights. Fortunately she is staying in the range of 160-163. Sometimes she varies out of this. She then takes her Zaroxolyn and responds. Currently she takes Zaroxolyn approximately 3 days per week. She is having leg cramps. We will check her potassium again.  Allergies  Allergen Reactions  . Aspirin     REACTION: rash    Current Outpatient Prescriptions  Medication Sig Dispense Refill  . BACTROBAN 2 % Apply 1 application topically as needed.       . Cholecalciferol (VITAMIN D3) 1000 UNITS CAPS Take 1 capsule by mouth daily.        . cyclobenzaprine (FLEXERIL) 5 MG tablet Take 5-10 mg by mouth 2 (two) times daily as needed. For muscle spasm.      . diazepam (VALIUM) 5 MG tablet Take 5 mg by mouth 2 (two) times daily as needed. For anxiety.      . furosemide (LASIX) 80 MG tablet TAKE ONE TABLET BY MOUTH TWICE DAILY  60 tablet  10  . HYDROcodone-acetaminophen (LORTAB) 10-500 MG per tablet Take 1 tablet by mouth every 4 (four) hours as needed. For pain.      . meloxicam (MOBIC) 15 MG tablet Take 15 mg by mouth daily.        . metolazone (ZAROXOLYN) 2.5 MG tablet Take 2.5 mg by mouth 3 (three) times a week. Takes 2.5mg  Tuesdays, Thursdays, Sundays      . Multiple Vitamins-Minerals (MULTIVITAMINS THER. W/MINERALS) TABS Take 1 tablet by mouth daily.        . potassium chloride SA (K-DUR,KLOR-CON) 20 MEQ tablet Take 2 tabs by mouth in the am and 1 tab in the pm  90 tablet  11    History   Social History  . Marital Status: Widowed    Spouse Name: N/A    Number of Children: N/A  . Years of Education: N/A   Occupational History  . Retired    Social History Main Topics  . Smoking status: Never Smoker   . Smokeless tobacco: Not on file  . Alcohol Use:  No  . Drug Use: No  . Sexually Active: Not on file   Other Topics Concern  . Not on file   Social History Narrative   Retired Widowed Tobacco Use - No. Alcohol Use - noRegular Exercise - noDrug Use - no    Family History  Problem Relation Age of Onset  . Cardiomyopathy Father   . Diabetes Father   . Cancer       son  . Coronary artery disease      son    Past Medical History  Diagnosis Date  . Edema     . EF 65-70%, echo, November 29, 2010  . Pulmonary hypertension      46  mmHg... echo... January, 2012  . Cellulitis     Superficial cellulitis right lower leg   January 14, 2011  . AV block      2:1  January 14, 2011 / pacemaker plan when cellulitis resolved in length  . Bradycardia     2:1  AV block  . Hypokalemia     October, 2012  . Ejection fraction     EF 65%, echo, January, 2012  . Murmur  No significant valvular disease by echo, January, 2012  . Pacemaker     January, 2013    Past Surgical History  Procedure Date  . Partial hysterectomy   . Hip arthroplasty     total    ROS  Patient denies fever, chills, headache, sweats, rash, change in vision, change in hearing, chest pain, cough, nausea vomiting, urinary symptoms. All other systems are reviewed and are negative.  PHYSICAL EXAM Patient is oriented to person time and place. Affect is normal. She is in a wheelchair. She is here with one of her daughters. Lungs are clear. Respiratory effort is nonlabored. Cardiac exam reveals S1 and S2. There no clicks or significant murmurs. The abdomen is soft. She has significant peripheral edema in both legs. However this is stable for her. There is no redness or heat. She has less edema when she wears her support hose. She's not wearing them today.  Filed Vitals:   06/28/12 1403  BP: 114/64  Pulse: 84  Height: 4\' 5"  (1.346 m)  Weight: 174 lb (78.926 kg)    EKG is done today and reviewed by me. The rhythm is paced. ASSESSMENT & PLAN

## 2012-06-28 NOTE — Assessment & Plan Note (Signed)
Her pacemaker is in place.It is not being evaluated today.

## 2012-06-28 NOTE — Assessment & Plan Note (Signed)
Blood pressures control. No change in therapy. 

## 2012-06-29 ENCOUNTER — Telehealth: Payer: Self-pay

## 2012-06-29 NOTE — Telephone Encounter (Signed)
Mrs Drone states that her weight is 170.6 today, up from 164.0#.  Per Dr Myrtis Ser, she is to take one dose of zaroxolyn (2.5mg ) today.  She is also to increase her potassium to daily (she was taking daily) because of low potassium per Dr Myrtis Ser.  She was notified and agrees.

## 2012-07-02 NOTE — Telephone Encounter (Signed)
Mrs Ogan reports her weight as 167.2 today.  She took zaroxolyn this am.  Right le swelling is better and left le swelling is unchanged.  No sob.

## 2012-07-03 NOTE — Telephone Encounter (Signed)
Pt states her weight is up to 168.2# today.  She states she took an extra zaroxolyn this am.    No change in edema and sob.

## 2012-07-04 NOTE — Telephone Encounter (Signed)
Mrs Liese states her weight is 163.4# today.  Edema and sob are unchanged

## 2012-07-05 NOTE — Telephone Encounter (Signed)
[name]'s weight today is 163.0.  No change in edema or sob.

## 2012-07-09 NOTE — Telephone Encounter (Signed)
Mrs Farabee states her weight is 163.4# today.  Edema and sob unchanged.

## 2012-07-11 ENCOUNTER — Telehealth: Payer: Self-pay

## 2012-07-11 DIAGNOSIS — I509 Heart failure, unspecified: Secondary | ICD-10-CM

## 2012-07-11 NOTE — Telephone Encounter (Signed)
[name] reports that she has gained 7# today.  She states her edema is unchanged and no sob.  She will take a dose of Zaroxolyn today.  If her weight is still up tomorrow, she will call and speak with the triage nurse who will contact Dr Myrtis Ser for further orders.  She states she understands.

## 2012-07-11 NOTE — Telephone Encounter (Signed)
[name] states she has not weighed yet but her weight yesterday was 163.2#.  No sob, edema is unchanged.

## 2012-07-12 NOTE — Telephone Encounter (Signed)
Spoke with [name] and advised, per dr Myrtis Ser, take your zaroxalyn today, at the same time you take your other diuretic and call us with your weight tomorrow--[name] agrees

## 2012-07-12 NOTE — Telephone Encounter (Signed)
Patient returning call to Mylo Red, she can be reached at hm#

## 2012-07-12 NOTE — Telephone Encounter (Signed)
Pt calls in weight today after a night of diuresis. She will have friend reweigh her to double check weight. Denies any shortness of breath. States edema of the ankles is less than yesterday. She did have leg cramps at 0600 this morning. But no leg cramps now.  Her weight prior to voiding this am was 173.   She will have it rechecked as " I went to the bathroom so much I don't see how it went up" She will call back with reweight  Then will Call Dr. Myrtis Ser for further orders.   Mylo Red RN

## 2012-07-12 NOTE — Telephone Encounter (Signed)
lmtcb--nt

## 2012-07-12 NOTE — Telephone Encounter (Signed)
Fu msg Pt weight today is 173

## 2012-07-13 NOTE — Telephone Encounter (Signed)
Weight= down to170.8# today.  Edema is better and sob unchanged per pt.  She will take zaroxolyn today per Dr Myrtis Ser.

## 2012-07-16 NOTE — Telephone Encounter (Signed)
[name] states her weight is up to 171.8# today.  She states she took zaroxolyn earlier this am along with her lasix.  She states her edema is better and no sob.

## 2012-07-17 NOTE — Telephone Encounter (Signed)
[name] states her weight is 171.2# today which is down a half pound.  Edema is unchanged and she denies sob.

## 2012-07-18 NOTE — Telephone Encounter (Signed)
Wt=172.6#.  Edema and sob unchanged.  Scheduled to take zaroxolyn tomorrow.

## 2012-07-19 NOTE — Telephone Encounter (Signed)
Weight=172.6#.  Edema unchanged (left foot and ankle only).  No sob.  Per Dr Myrtis Ser, pt is to take her zaroxolyn along with her afternoon/evening dose of lasix if her weight is 170 or above.

## 2012-07-24 NOTE — Telephone Encounter (Signed)
[name] states her weight is 170#.  Edema and sob are unchanged.

## 2012-07-25 NOTE — Telephone Encounter (Signed)
Wt=169.6.  No sob and edema is unchanged.  Pt states she took zaroxolyn this am, (forgot she is only supposed to take it for a weight 170# or greater).

## 2012-07-26 NOTE — Telephone Encounter (Signed)
Weight today is 159.0#.  Slightly more ankle edema and no sob.

## 2012-07-27 NOTE — Telephone Encounter (Signed)
N/A x 2 today.  LMTC.

## 2012-07-30 NOTE — Telephone Encounter (Signed)
Weight=171.8# today and 169.0# yesterday.  She states she still has a little swelling in her left ankle/foot but is able to get her support hose on.  No sob.  She will contact her pcp regarding the restlessness of her legs.

## 2012-07-30 NOTE — Telephone Encounter (Signed)
F/U  Daughter [name] calling back regarding weight.

## 2012-07-30 NOTE — Telephone Encounter (Signed)
[name], Mrs Schnee's daughter, called to report that Mrs Authier is having pain and restlessness in her upper legs.  [name] is requesting that I tell  Her to see her local physician for it.

## 2012-07-31 NOTE — Telephone Encounter (Signed)
Weight=174.2#.  Edema is unchanged.  No sob.

## 2012-08-01 NOTE — Telephone Encounter (Signed)
Weight=170.2#.   Slight edema as normal and no sob.

## 2012-08-02 NOTE — Telephone Encounter (Signed)
Wt=170.0# today.  No sob.  Edema is better per pt.

## 2012-08-06 NOTE — Telephone Encounter (Signed)
Wt today=171.4#.   Edema is better and no sob per pt.  She will take her zaroxolyn with her second lasix dose per Dr Henrietta Hoover instructions.

## 2012-08-07 NOTE — Telephone Encounter (Signed)
Weight today=173.3#   Edema is better and sob is better.

## 2012-08-08 NOTE — Telephone Encounter (Signed)
Wt=170.2#.  No sob, edema is better.

## 2012-08-10 NOTE — Telephone Encounter (Signed)
Weight=170.4#.  Mrs Portley daughter is at her house and states she looks well.  Edema better. No sob.

## 2012-08-13 NOTE — Telephone Encounter (Signed)
Weight=170.0#.  Edema is better and no sob per pt.

## 2012-08-14 NOTE — Telephone Encounter (Signed)
Weight=169.6#   Edema is better and she denies sob.

## 2012-08-15 NOTE — Telephone Encounter (Signed)
Wt=171.0#.  No change in edema.  No sob.  [name] will take zaroxolyn for weight greater than 170# as per Dr Henrietta Hoover instructions.

## 2012-08-17 ENCOUNTER — Telehealth: Payer: Self-pay

## 2012-08-17 NOTE — Telephone Encounter (Signed)
Attempted to call pt multiple times, her phone remains busy.

## 2012-08-20 NOTE — Telephone Encounter (Signed)
WT=170.4#. [name] states that pt. has slight edema today and no sob./LV

## 2012-08-21 NOTE — Telephone Encounter (Signed)
Pt's weight is 173.0 lbs. today. She denies sob but states she has edema in left ankle. Pt. states she took Zaroxolyn this am./LV

## 2012-08-24 NOTE — Telephone Encounter (Signed)
Pt's states her weight is 171.2 lbs today. She reports swelling in her left foot and denies SOB. Pt. states she will take Zaroxolyn dose in the morning./LV  Pt's weight on 10/3 was 170.6 lbs. She states she has edema in her left foot and denies sob./LV

## 2012-08-27 NOTE — Telephone Encounter (Signed)
Weight=172.2 lbs. Pt. States she has swelling in her left ankle but no SOB. Pt. states that she took Zaroxolyn this am./LV

## 2012-08-28 NOTE — Telephone Encounter (Signed)
Weight= 171.4 lbs. She states that edema in left ankle has improved and she denies SOB. Pt. Took Zaroxolyn this am./LV

## 2012-08-29 NOTE — Telephone Encounter (Signed)
Weight=171.2#.  Sl ankle swelling and no sob per pt.

## 2012-08-30 NOTE — Telephone Encounter (Signed)
Weight=170.0# today.  No change in edema.  Slight sob with exertion.

## 2012-08-31 NOTE — Telephone Encounter (Signed)
Weight today is up to 170.3#.  Edema and sob are unchanged.

## 2012-09-03 NOTE — Telephone Encounter (Signed)
Weight=171.2#.  She states her edema is better and no sob.  She states she took her zaroxolyn along with her pm dose of Lasix.

## 2012-09-04 NOTE — Telephone Encounter (Signed)
Weight today=173.0#.  She states her weight is up but her edema is better (le).  She denies sob.  She states she took her zaroxolyn this am.

## 2012-09-05 NOTE — Telephone Encounter (Signed)
Busy signal x 4 today.  Unable to reach pt.  Will try tomorrow.

## 2012-09-06 NOTE — Telephone Encounter (Signed)
Weight yesterday was 171.0#.  Weight today=170.4#.  She states her edema is better and denies sob.

## 2012-09-10 NOTE — Telephone Encounter (Signed)
[name] states her weight is 170.3# today.  No change in edema (slight in left foot), no sob.  Her biggest problem is leg cramps.

## 2012-09-11 NOTE — Telephone Encounter (Signed)
Weight=170.0# today.  Edema is better and no sob.

## 2012-09-12 NOTE — Telephone Encounter (Signed)
Weight=170.0# today.   She states her edema is better and she denies sob.

## 2012-09-13 NOTE — Telephone Encounter (Signed)
Weight=172.0.  Edema unchanged and slight sob.  [name] states she took her zaroxolyn per Dr Henrietta Hoover orders.

## 2012-09-16 ENCOUNTER — Other Ambulatory Visit: Payer: Self-pay | Admitting: Cardiology

## 2012-09-18 ENCOUNTER — Other Ambulatory Visit (INDEPENDENT_AMBULATORY_CARE_PROVIDER_SITE_OTHER): Payer: Medicare Other

## 2012-09-18 DIAGNOSIS — I509 Heart failure, unspecified: Secondary | ICD-10-CM

## 2012-09-18 DIAGNOSIS — M25519 Pain in unspecified shoulder: Secondary | ICD-10-CM | POA: Diagnosis not present

## 2012-09-18 LAB — BASIC METABOLIC PANEL
BUN: 46 mg/dL — ABNORMAL HIGH (ref 6–23)
CO2: 31 mEq/L (ref 19–32)
Calcium: 9.1 mg/dL (ref 8.4–10.5)
Creatinine, Ser: 1.4 mg/dL — ABNORMAL HIGH (ref 0.4–1.2)
GFR: 48.26 mL/min — ABNORMAL LOW (ref >60.00)
Glucose, Bld: 99 mg/dL (ref 70–99)

## 2012-09-18 NOTE — Telephone Encounter (Signed)
[name] was notified of the need to increase her potassium.  She agrees.  She states she forgot to weigh herself today.

## 2012-09-18 NOTE — Telephone Encounter (Signed)
Potassium level came back at 2.8.  [name] is to increase her potassium to 40 meq bid and take an extra 80 meq tonight per Dr Myrtis Ser.

## 2012-09-19 NOTE — Telephone Encounter (Signed)
Busy signal on pt's phone all afternoon.

## 2012-09-20 NOTE — Telephone Encounter (Signed)
She states her edema is the same and no sob.

## 2012-09-20 NOTE — Telephone Encounter (Signed)
Mrs Rettinger states she has been having trouble with her phone.  Weight yesterday was 176.0#.  She took her zaroxolyn and her weight today is 171.0#.

## 2012-09-21 NOTE — Telephone Encounter (Signed)
Weight today=170.7#.  No change in edema and sob.  She was reminded about her appt on Monday and to weigh herself that morning, write it down and bring it with her to the appt.

## 2012-09-24 ENCOUNTER — Ambulatory Visit (INDEPENDENT_AMBULATORY_CARE_PROVIDER_SITE_OTHER): Payer: Medicare Other | Admitting: Cardiology

## 2012-09-24 ENCOUNTER — Encounter: Payer: Self-pay | Admitting: Cardiology

## 2012-09-24 ENCOUNTER — Telehealth: Payer: Self-pay | Admitting: Cardiology

## 2012-09-24 VITALS — BP 142/70 | HR 85 | Ht <= 58 in | Wt 182.4 lb

## 2012-09-24 DIAGNOSIS — L0390 Cellulitis, unspecified: Secondary | ICD-10-CM | POA: Diagnosis not present

## 2012-09-24 DIAGNOSIS — E876 Hypokalemia: Secondary | ICD-10-CM | POA: Diagnosis not present

## 2012-09-24 DIAGNOSIS — I10 Essential (primary) hypertension: Secondary | ICD-10-CM | POA: Diagnosis not present

## 2012-09-24 DIAGNOSIS — I4430 Unspecified atrioventricular block: Secondary | ICD-10-CM | POA: Diagnosis not present

## 2012-09-24 DIAGNOSIS — L0291 Cutaneous abscess, unspecified: Secondary | ICD-10-CM

## 2012-09-24 DIAGNOSIS — R609 Edema, unspecified: Secondary | ICD-10-CM

## 2012-09-24 LAB — BASIC METABOLIC PANEL
BUN: 33 mg/dL — ABNORMAL HIGH (ref 6–23)
Calcium: 9.1 mg/dL (ref 8.4–10.5)
Creatinine, Ser: 1.2 mg/dL (ref 0.4–1.2)
GFR: 55.75 mL/min — ABNORMAL LOW (ref >60.00)

## 2012-09-24 NOTE — Assessment & Plan Note (Signed)
The patient received a pacemaker for her AV block January, 2013. She has benefited from this and done well.

## 2012-09-24 NOTE — Assessment & Plan Note (Signed)
Most recently she had marked hypokalemia. We increased her potassium dose. Potassium will be checked today.

## 2012-09-24 NOTE — Telephone Encounter (Signed)
New Prboelm:    Called wanting you to give her a call back.  Please call back.

## 2012-09-24 NOTE — Assessment & Plan Note (Signed)
Blood pressure is controlled today. No change in therapy. 

## 2012-09-24 NOTE — Assessment & Plan Note (Signed)
She has not had any recent cellulitis. She tends to get this only when she has marked edema.

## 2012-09-24 NOTE — Telephone Encounter (Signed)
N/A.  LMTC. 

## 2012-09-24 NOTE — Assessment & Plan Note (Signed)
The patient does not have marked edema today. We keep a daily weights schedule. Her weight today at home was 170 pounds.

## 2012-09-24 NOTE — Progress Notes (Signed)
Patient ID: [name], female   DOB: June 13, 1931, 78 y.o.   MRN: [id number]   HPI The patient is seen today to follow up her volume status. My nurse calls the patient every day during the week to discuss her weight. We have done followup outpatient labs. Most recently her potassium was low and the potassium dose was increased. It is very difficult to be absolutely sure what medicine she is taking. This is why we talked were every day to try to keep up with that. This is a huge time and energy resources but it is keeping her stable. She does not have marked edema in her legs. After the nurse speaks with the patient she lets me know every day which he has learned and I respond to her.  Allergies  Allergen Reactions  . Aspirin     REACTION: rash    Current Outpatient Prescriptions  Medication Sig Dispense Refill  . BACTROBAN 2 % Apply 1 application topically as needed.       . Cholecalciferol (VITAMIN D3) 1000 UNITS CAPS Take 1 capsule by mouth daily.        . cyclobenzaprine (FLEXERIL) 5 MG tablet Take 5-10 mg by mouth 2 (two) times daily as needed. For muscle spasm.      . diazepam (VALIUM) 5 MG tablet Take 5 mg by mouth 2 (two) times daily as needed. For anxiety.      . furosemide (LASIX) 80 MG tablet TAKE ONE TABLET BY MOUTH TWICE DAILY  60 tablet  10  . HYDROcodone-acetaminophen (LORTAB) 10-500 MG per tablet Take 1 tablet by mouth every 4 (four) hours as needed. For pain.      . meloxicam (MOBIC) 15 MG tablet Take 15 mg by mouth daily.        . metolazone (ZAROXOLYN) 2.5 MG tablet Take one tablet with Furosemide 3 times a week  30 tablet  2  . Multiple Vitamins-Minerals (MULTIVITAMINS THER. W/MINERALS) TABS Take 1 tablet by mouth daily.        . potassium chloride SA (K-DUR,KLOR-CON) 20 MEQ tablet Take 2 tabs by mouth in the am and 1 tab in the pm  90 tablet  11  . methylPREDNIsolone (MEDROL DOSPACK) 4 MG tablet       . [DISCONTINUED] metolazone (ZAROXOLYN) 2.5 MG tablet Take 2.5 mg by  mouth 3 (three) times a week. Takes 2.5mg  Tuesdays, Thursdays, Sundays        History   Social History  . Marital Status: Widowed    Spouse Name: N/A    Number of Children: N/A  . Years of Education: N/A   Occupational History  . Retired    Social History Main Topics  . Smoking status: Never Smoker   . Smokeless tobacco: Not on file  . Alcohol Use: No  . Drug Use: No  . Sexually Active: Not on file   Other Topics Concern  . Not on file   Social History Narrative   Retired Widowed Tobacco Use - No. Alcohol Use - noRegular Exercise - noDrug Use - no    Family History  Problem Relation Age of Onset  . Cardiomyopathy Father   . Diabetes Father   . Cancer       son  . Coronary artery disease      son    Past Medical History  Diagnosis Date  . Edema     . EF 65-70%, echo, November 29, 2010  . Pulmonary hypertension  46 mmHg... echo... January, 2012  . Cellulitis     Superficial cellulitis right lower leg   January 14, 2011  . AV block      2:1  January 14, 2011 / pacemaker plan when cellulitis resolved in length  . Bradycardia     2:1  AV block  . Hypokalemia     October, 2012  . Ejection fraction     EF 65%, echo, January, 2012  . Murmur     No significant valvular disease by echo, January, 2012  . Pacemaker     January, 2013    Past Surgical History  Procedure Date  . Partial hysterectomy   . Hip arthroplasty     total    Patient Active Problem List  Diagnosis  . Edema  . AV block  . Cellulitis  . Pulmonary hypertension  . Ejection fraction  . Murmur  . Hypokalemia  . Hypertension  . Cardiac pacemaker  . Pacemaker    ROS   Patient denies fever, chills, headache, sweats, rash, change in vision, change in hearing, chest pain, cough, nausea vomiting, urinary symptoms. All other systems are reviewed and are negative.  PHYSICAL EXAM  Patient is oriented to person time and place. Affect is normal. She's here with a family member. She is  in a wheelchair. There is no jugulovenous distention. Lungs are clear. Respiratory effort is nonlabored. Cardiac exam reveals S1 and S2. There no clicks or significant murmurs. The abdomen is obese but soft. The patient has large legs but she does not have any significant edema compared to what we have seen in the past.  Filed Vitals:   09/24/12 1037  BP: 142/70  Pulse: 85  Height: 4\' 5"  (1.346 m)  Weight: 182 lb 6.4 oz (82.736 kg)  SpO2: 99%     ASSESSMENT & PLAN

## 2012-09-24 NOTE — Patient Instructions (Addendum)
Your physician recommends that you return for lab work in: bmet  Your physician wants you to follow-up in: 6 months.  You will receive a reminder letter in the mail two months in advance. If you don't receive a letter, please call our office to schedule the follow-up appointment.  Your physician has recommended you make the following change in your medication: Take your zaroxolyn (pink pill) on Monday, Wednesday and Friday and if your weight is greater than 172 pounds.

## 2012-09-25 NOTE — Telephone Encounter (Signed)
Mrs Bucklin daughter, [name], was called and told that her edema is better.  She was also given Mrs Grave's lab results.

## 2012-09-26 ENCOUNTER — Telehealth: Payer: Self-pay

## 2012-09-26 NOTE — Telephone Encounter (Signed)
[name] was in for an appt on 09/24/12.  On 09/25/12, she weighed 172.2#.  Today, she is up to 177.2#.  Edema and sob are unchanged and she states she has been voiding a lot today.  She took her zaroxolyn this am.

## 2012-09-27 NOTE — Telephone Encounter (Signed)
Weight today is down to 171.0#.  Edema and sob are same (slight).  She states she is keeping her le elevated.

## 2012-10-01 NOTE — Telephone Encounter (Signed)
Weight today=171.2#.  Edema and sob are unchanged per pt.

## 2012-10-02 NOTE — Telephone Encounter (Signed)
NA. No voicemail.  

## 2012-10-08 NOTE — Telephone Encounter (Signed)
Weight= 171.8# today.  Edema and sob are unchanged.  [name] states she took her zaroxolyn this am.

## 2012-10-09 NOTE — Telephone Encounter (Signed)
Mrs Pickford states she has been voiding quite a bit today.

## 2012-10-09 NOTE — Telephone Encounter (Signed)
Weight=174.4#.  Denies edema and no sob.

## 2012-10-10 NOTE — Telephone Encounter (Signed)
NA. No voicemail.  

## 2012-10-11 NOTE — Telephone Encounter (Signed)
Weight yesterday and today=174.4.  No change in edema per pt..  Denies sob.

## 2012-10-11 NOTE — Telephone Encounter (Signed)
Per Dr Myrtis Ser, [name] is to take her zaroxolyn for a weight of 175# or higher.

## 2012-10-15 ENCOUNTER — Ambulatory Visit: Payer: Medicare Other | Admitting: Cardiology

## 2012-10-15 NOTE — Telephone Encounter (Signed)
Weight=176.2# today.  Slight swelling left ankle only per pt.  She denies sob.  She will take zaroxolyn today per Dr Henrietta Hoover orders.

## 2012-10-16 NOTE — Telephone Encounter (Signed)
Weight today= 173.0#.  No change in edema or sob.

## 2012-10-17 NOTE — Telephone Encounter (Signed)
Weight today=170.6#.  Edema and sob are unchanged.

## 2012-10-17 NOTE — Telephone Encounter (Signed)
No answer. Left message to call back.   

## 2012-10-22 NOTE — Telephone Encounter (Signed)
Weight=167.6#.  No change in edema.  No sob.  She states she is doing well.

## 2012-10-23 NOTE — Telephone Encounter (Signed)
Weight=165.2#.  Edema better in left. No edema in right per pt.  She states her leg pain is better.  No sob per pt.

## 2012-10-24 NOTE — Telephone Encounter (Signed)
Edema is better and very little sob.

## 2012-10-24 NOTE — Telephone Encounter (Signed)
Weight: 165.2

## 2012-10-25 MED ORDER — POTASSIUM CHLORIDE CRYS ER 20 MEQ PO TBCR: EXTENDED_RELEASE_TABLET | ORAL | Status: DC

## 2012-10-25 NOTE — Telephone Encounter (Signed)
Weight=165.2#.  No change in edema or sob.

## 2012-10-30 NOTE — Telephone Encounter (Signed)
Mrs Aristizabal states her weight=167.8# today.  She states her left foot is slightly more swollen.  She denies swelling in right foot and denies sob.

## 2012-10-30 NOTE — Telephone Encounter (Signed)
She states she took her zaroxolyn today along with her lasix dose.

## 2012-10-31 NOTE — Telephone Encounter (Signed)
Weight=167.1#.  She states her edema is better and no sob.

## 2012-11-01 NOTE — Telephone Encounter (Signed)
Weight=167.1#.  She states her edema is unchanged (left ankle only) and she denies sob.

## 2012-11-02 NOTE — Telephone Encounter (Signed)
Weight=137.2#.  She denies sob.  She states her edema is unchanged but is having a lot of pain in her groin.  I recommended that she go to her pcp to have this evaluated.

## 2012-11-05 NOTE — Telephone Encounter (Signed)
Weight=170.2#.  No change in edema or sob.

## 2012-11-06 NOTE — Telephone Encounter (Signed)
Weight=157.6# today.  She states she took her "fluid pills and milk of magnesia and was "up all night in the bathroom".  She states her edema is better and denies sob.

## 2012-11-07 NOTE — Telephone Encounter (Signed)
She thinks the weight was wrong yesterday.

## 2012-11-07 NOTE — Telephone Encounter (Signed)
Weight=170.2# today.  Edema is slight in the left ankle as normal and no sob.

## 2012-11-08 NOTE — Telephone Encounter (Signed)
Weight=176.2# today.  Edema is unchanged and she denies sob.

## 2012-11-12 NOTE — Telephone Encounter (Signed)
Weight= 173.1#.  No change in edema or sob.

## 2012-11-13 NOTE — Telephone Encounter (Signed)
Weight=171.2#.  No change in edema and sob.

## 2012-11-15 NOTE — Telephone Encounter (Signed)
Weight=170.0#.  Slight edema left ankle and she denies sob.

## 2012-11-16 NOTE — Telephone Encounter (Signed)
Weight=170.3#.  No change in edema.  She denies sob.

## 2012-11-20 ENCOUNTER — Telehealth: Payer: Self-pay | Admitting: Internal Medicine

## 2012-11-20 NOTE — Telephone Encounter (Signed)
Weight= 171.2. Slight edema in left ankle. Denies SOB. No change made today.

## 2012-11-20 NOTE — Telephone Encounter (Signed)
plz return call to Daughter [name]  [phone], who has some concerns to discuss prior to pt next appnt.

## 2012-11-20 NOTE — Telephone Encounter (Signed)
Daughter called and she states her Mom is still feeling really tired after her device placement.  C/O no energy and just want Dr Ladona Ridgel to be aware

## 2012-11-22 ENCOUNTER — Ambulatory Visit (INDEPENDENT_AMBULATORY_CARE_PROVIDER_SITE_OTHER): Payer: Medicare Other | Admitting: Internal Medicine

## 2012-11-22 ENCOUNTER — Encounter: Payer: Self-pay | Admitting: Internal Medicine

## 2012-11-22 VITALS — BP 168/88 | HR 91 | Wt 182.0 lb

## 2012-11-22 DIAGNOSIS — I10 Essential (primary) hypertension: Secondary | ICD-10-CM

## 2012-11-22 DIAGNOSIS — I4430 Unspecified atrioventricular block: Secondary | ICD-10-CM

## 2012-11-22 DIAGNOSIS — Z950 Presence of cardiac pacemaker: Secondary | ICD-10-CM | POA: Diagnosis not present

## 2012-11-22 LAB — PACEMAKER DEVICE OBSERVATION
AL THRESHOLD: 0.5 V
ATRIAL PACING PM: 2.6
BAMS-0001: 150 {beats}/min
RV LEAD THRESHOLD: 0.75 V

## 2012-11-22 NOTE — Assessment & Plan Note (Signed)
Her Medtronic dual-chamber pacemaker is working normally. We'll plan to recheck in several months. 

## 2012-11-22 NOTE — Progress Notes (Signed)
HPI [name] returns today for followup. She is a very pleasant 79 year old woman with a history of 21 heart block and hypertension, morbid obesity, diastolic heart failure, and chronic venous insufficiency, with chronic peripheral edema. In the interim she has been stable. She has had trouble with volume overload and peripheral edema. She states that today she did not take her diuretic. She has not had syncope. She denies fevers or chills. No night sweats. Allergies  Allergen Reactions  . Aspirin     REACTION: rash     Current Outpatient Prescriptions  Medication Sig Dispense Refill  . BACTROBAN 2 % Apply 1 application topically as needed.       . Cholecalciferol (VITAMIN D3) 1000 UNITS CAPS Take 1 capsule by mouth daily.        . cyclobenzaprine (FLEXERIL) 5 MG tablet Take 5-10 mg by mouth 2 (two) times daily as needed. For muscle spasm.      . diazepam (VALIUM) 5 MG tablet Take 5 mg by mouth 2 (two) times daily as needed. For anxiety.      . furosemide (LASIX) 80 MG tablet TAKE ONE TABLET BY MOUTH TWICE DAILY  60 tablet  10  . HYDROcodone-acetaminophen (LORTAB) 10-500 MG per tablet Take 1 tablet by mouth every 4 (four) hours as needed. For pain.      . meloxicam (MOBIC) 15 MG tablet Take 15 mg by mouth daily.        . metolazone (ZAROXOLYN) 2.5 MG tablet Take one tablet with Furosemide 3 times a week  30 tablet  2  . Multiple Vitamins-Minerals (MULTIVITAMINS THER. W/MINERALS) TABS Take 1 tablet by mouth daily.        . potassium chloride SA (K-DUR,KLOR-CON) 20 MEQ tablet Take 2 tabs by mouth in the am and 2 tabs in the pm.  90 tablet  11     Past Medical History  Diagnosis Date  . Edema     . EF 65-70%, echo, November 29, 2010  . Pulmonary hypertension      46 mmHg... echo... January, 2012  . Cellulitis     Superficial cellulitis right lower leg   January 14, 2011  . AV block      2:1  January 14, 2011 / pacemaker plan when cellulitis resolved in length  . Bradycardia     2:1   AV block  . Hypokalemia     October, 2012  . Ejection fraction     EF 65%, echo, January, 2012  . Murmur     No significant valvular disease by echo, January, 2012  . Pacemaker     January, 2013    ROS:   All systems reviewed and negative except as noted in the HPI.   Past Surgical History  Procedure Date  . Partial hysterectomy   . Hip arthroplasty     total     Family History  Problem Relation Age of Onset  . Cardiomyopathy Father   . Diabetes Father   . Cancer       son  . Coronary artery disease      son     History   Social History  . Marital Status: Widowed    Spouse Name: N/A    Number of Children: N/A  . Years of Education: N/A   Occupational History  . Retired    Social History Main Topics  . Smoking status: Never Smoker   . Smokeless tobacco: Not on file  . Alcohol Use:  No  . Drug Use: No  . Sexually Active: Not on file   Other Topics Concern  . Not on file   Social History Narrative   Retired Widowed Tobacco Use - No. Alcohol Use - noRegular Exercise - noDrug Use - no     BP 168/88  Pulse 91  Wt 182 lb (82.555 kg)  Physical Exam:  Obese, elderly appearing NAD HEENT: Unremarkable Neck:  9 cm JVD, no thyromegally Lungs:  Clear with no wheezes or rhonchi. Rare expiratory rales. HEART:  Regular rate rhythm, no murmurs, no rubs, no clicks Abd:  soft, obese, positive bowel sounds, no organomegally, no rebound, no guarding Ext:  2 plus pulses, no edema, no cyanosis, no clubbing Skin:  No rashes no nodules Neuro:  CN II through XII intact, motor grossly intact   DEVICE  Normal device function.  See PaceArt for details.   Assess/Plan:

## 2012-11-22 NOTE — Patient Instructions (Addendum)
Your physician wants you to follow-up in: 3 months in the device clinic and 12 months with Dr Court Joy will receive a reminder letter in the mail two months in advance. If you don't receive a letter, please call our office to schedule the follow-up appointment.

## 2012-11-22 NOTE — Assessment & Plan Note (Signed)
Her blood pressure is elevated today and she has not had her diuretic. We discussed the importance of a low-sodium diet, and taking her medications are regular basis. She understands and will proceed in this manner. She is instructed to weigh herself daily. She knows that if her weight goes up, she was take additional diuretic therapy.

## 2012-11-26 NOTE — Telephone Encounter (Signed)
Weight=174.2#.  Edema is unchanged (slight in left ankle) and she denies sob.  She took her zaroxolyn this am.

## 2012-11-28 NOTE — Telephone Encounter (Signed)
Weight today=170.4#.  No change in edema.  She denies sob.

## 2012-11-29 NOTE — Telephone Encounter (Signed)
Weight today=172.4#.  She states her edema is the same and she denies sob.  She will take zaroxolyn per Dr Henrietta Hoover standing orders.

## 2012-11-30 NOTE — Telephone Encounter (Signed)
Weight=173.4# today.  No change in edema and no sob.  She will take her zaroxolyn per Dr Henrietta Hoover standing orders.

## 2012-12-03 NOTE — Telephone Encounter (Signed)
Weight today=174.6#.  No change in edema.  She denies sob.  She will take zaroxolyn per Dr Henrietta Hoover standing orders for a weight above 172#.

## 2012-12-04 ENCOUNTER — Telehealth: Payer: Self-pay

## 2012-12-04 DIAGNOSIS — R609 Edema, unspecified: Secondary | ICD-10-CM

## 2012-12-04 NOTE — Telephone Encounter (Signed)
Weight today=170.0#.  Edema is unchanged and she denies sob.

## 2012-12-05 ENCOUNTER — Telehealth: Payer: Self-pay | Admitting: Cardiology

## 2012-12-05 NOTE — Telephone Encounter (Signed)
Weight=170.4#.  No change in edema.  She denies sob.

## 2012-12-05 NOTE — Telephone Encounter (Signed)
New Problem:    Patient's daughter called in wanting to ask you a question.  Please call back.

## 2012-12-05 NOTE — Telephone Encounter (Signed)
Mrs Apt daughter, [name], is inquiring about pt for her mother.  I recommended that she contact Mrs Valtierra's pcp for this.

## 2012-12-06 NOTE — Telephone Encounter (Signed)
Weight=170.3#.   No change in edema and she denies sob.

## 2012-12-10 NOTE — Telephone Encounter (Signed)
Weight=17.2# today.  No edema and no sob.  She will take zaroxolyn per Dr Henrietta Hoover orders.

## 2012-12-10 NOTE — Telephone Encounter (Signed)
NA x2

## 2012-12-12 NOTE — Telephone Encounter (Signed)
Weight=173.2# today.  She states she took her zaroxolyn today per Dr Henrietta Hoover standing instructions.  She states her edema is unchanged and she denies sob.

## 2012-12-14 NOTE — Telephone Encounter (Signed)
Weight=171.2#.  No change in edema, denies sob.

## 2012-12-17 NOTE — Telephone Encounter (Signed)
Weight=170.2#.  No change in edema and no sob per pt.

## 2012-12-18 NOTE — Telephone Encounter (Signed)
Weight today=172.2#.  She states her edema is unchanged and denies sob. She will take her zaroxolyn per Dr Henrietta Hoover orders for a weight of 172#.

## 2012-12-19 NOTE — Telephone Encounter (Signed)
Weight today was 170.1#.  She denies sob and states her edema is unchanged (sligiht in left foot).

## 2012-12-20 NOTE — Telephone Encounter (Signed)
NA. No voicemail.  

## 2012-12-20 NOTE — Telephone Encounter (Signed)
N/A x 2 today.

## 2012-12-24 NOTE — Telephone Encounter (Signed)
Weight=1701.0#.  No change in edema.  She denies sob.

## 2012-12-26 NOTE — Telephone Encounter (Signed)
Weight=170.1#.  No change in edema.  She denies sob.

## 2012-12-27 NOTE — Telephone Encounter (Signed)
Weight=171.2#.  She states her edema is unchanged and might have some slight sob.

## 2012-12-28 NOTE — Telephone Encounter (Signed)
Weight today=173.1#.  She denies sob and states her edema is unchanged (slight in left foot)

## 2012-12-31 NOTE — Telephone Encounter (Signed)
Weight=171.0#.  She states her edema is unchanged and no sob.

## 2013-01-01 NOTE — Telephone Encounter (Signed)
Weight today=171.3#.  She states her edema is slightly better and denies sob.

## 2013-01-02 NOTE — Telephone Encounter (Signed)
[name] states her helper did not come in yet to weigh her.  She is not sure if she will come today or not due to the weather.  No increased edema or sob.  She will call if her helper comes in to weigh her.

## 2013-01-07 NOTE — Telephone Encounter (Signed)
Weight today=175.1#.  No change in edema and pt denies sob.  She took her zaroxolyn this am per Dr Henrietta Hoover orders.

## 2013-01-09 NOTE — Telephone Encounter (Signed)
Weight=171.2.  She states her edema is unchanged and denies sob.

## 2013-01-10 NOTE — Telephone Encounter (Signed)
Weight today=173.2#.  She states her edema and sob are unchanged.  She was told to take her zaroxolyn today per Dr Henrietta Hoover standing orders for a weight over 172#.

## 2013-01-11 NOTE — Telephone Encounter (Signed)
Needs bmet per Dr Myrtis Ser

## 2013-01-11 NOTE — Telephone Encounter (Signed)
Weight today=173.2#.  No change in edema and denies sob.  Still having increased leg cramps but not as bad as earlier this week.  She did not take her zaroxolyn yesterday but did take it this am.

## 2013-01-14 NOTE — Telephone Encounter (Signed)
Weight today=173.1#.  She states her edema is unchanged and she denies sob.  She states she no longer has leg cramps and will wait on having a bmet.  She states she will call if her leg cramps start again.

## 2013-01-15 NOTE — Telephone Encounter (Signed)
Weight today= 171.1#.  No change in edema, slight sob per pt.

## 2013-01-16 ENCOUNTER — Telehealth: Payer: Self-pay | Admitting: Cardiology

## 2013-01-16 NOTE — Telephone Encounter (Signed)
Weight today=177.2#.  She states her edema is slight in the left foot and denies sob.  She states she will take her zaroxolyn today.

## 2013-01-16 NOTE — Telephone Encounter (Signed)
New Problem   Pt's daughter want to talk to you about her mother's fluid level.

## 2013-01-16 NOTE — Telephone Encounter (Signed)
N/A.  LMTC. 

## 2013-01-16 NOTE — Telephone Encounter (Signed)
[name] daughter, [name], called to find out when she needs her next appt? I scheduled [name] for her next f/u appt and gave [name] the date and time.  She also wanted to know what her mother's weights have been.  I gave her the weights and when she had taken the zaroxolyn.  I also told her that if [name] is complaining of leg cramps again, Dr Myrtis Ser oked her to have a bmet checked.  I told her that this order can be faxed locally if she prefers.

## 2013-01-16 NOTE — Telephone Encounter (Signed)
She also agrees to eat more potassium rich foods tonight to compensate for taking the zaroxolyn and hopefully avoid having leg cramps.

## 2013-01-16 NOTE — Telephone Encounter (Signed)
Pt returning your call

## 2013-01-17 NOTE — Telephone Encounter (Signed)
Weight today=171.2#.  She states her edema is unchanged and has a little sob.

## 2013-01-21 ENCOUNTER — Other Ambulatory Visit: Payer: Medicare Other

## 2013-01-22 ENCOUNTER — Telehealth: Payer: Self-pay

## 2013-01-22 NOTE — Telephone Encounter (Signed)
Weight today= 173.1#.  Edema is unchanged and sob is unchanged per pt.  She will take her zaroxolyn per Dr Henrietta Hoover orders for a weight above 172#.

## 2013-01-23 NOTE — Telephone Encounter (Signed)
Weight today=172.1#.  Edema is unchanged.  No sob.

## 2013-01-24 NOTE — Telephone Encounter (Signed)
New Prob   Daughter calling in, had some questions for the nurse regarding her mother. Would like to speak to nurse.

## 2013-01-24 NOTE — Telephone Encounter (Signed)
Weight today=171.1#.  Edema is unchanged.  Slight sob.

## 2013-01-28 ENCOUNTER — Other Ambulatory Visit: Payer: Medicare Other

## 2013-01-28 NOTE — Telephone Encounter (Signed)
Mrs Heffernan states she took her zaroxolyn today.

## 2013-01-28 NOTE — Telephone Encounter (Signed)
[name], Mrs Mumpower's daughter, was calling to find out when Mellody Dance is in the lab this week.

## 2013-01-28 NOTE — Telephone Encounter (Signed)
Weight today=172.2#.  She states her edema is better and she denies sob.

## 2013-01-29 NOTE — Telephone Encounter (Signed)
Weight today=171.1#.  No change in edema (slight) and no sob per pt.

## 2013-01-30 NOTE — Telephone Encounter (Signed)
Weight=173.2#.  She denies sob and states her edema is unchanged.

## 2013-02-05 NOTE — Telephone Encounter (Signed)
Weight=173.1# today.  No change in edema or sob.

## 2013-02-06 NOTE — Telephone Encounter (Signed)
Unable to reach pt.  Line was busy all day.

## 2013-02-07 NOTE — Telephone Encounter (Signed)
Weight=170.1# today.  No change in edema and no sob per pt

## 2013-02-08 NOTE — Telephone Encounter (Signed)
Weight=172.1# today.  She states her edema is slightly better and denies sob.

## 2013-02-11 ENCOUNTER — Other Ambulatory Visit: Payer: Medicare Other

## 2013-02-11 DIAGNOSIS — M549 Dorsalgia, unspecified: Secondary | ICD-10-CM | POA: Diagnosis not present

## 2013-02-11 DIAGNOSIS — I10 Essential (primary) hypertension: Secondary | ICD-10-CM | POA: Diagnosis not present

## 2013-02-11 DIAGNOSIS — M25559 Pain in unspecified hip: Secondary | ICD-10-CM | POA: Diagnosis not present

## 2013-02-11 NOTE — Telephone Encounter (Signed)
Weight today=170.1#.   She states her swelling is slightly better and she denies sob today.

## 2013-02-12 NOTE — Telephone Encounter (Signed)
Weight=172.1# today.  No change in edema.  She denies sob.

## 2013-02-13 NOTE — Telephone Encounter (Signed)
N/A x 4 today.  LM x 2 on recorder.

## 2013-02-13 NOTE — Telephone Encounter (Signed)
NA

## 2013-02-14 ENCOUNTER — Other Ambulatory Visit (INDEPENDENT_AMBULATORY_CARE_PROVIDER_SITE_OTHER): Payer: Medicare Other

## 2013-02-14 ENCOUNTER — Other Ambulatory Visit: Payer: Self-pay

## 2013-02-14 DIAGNOSIS — R609 Edema, unspecified: Secondary | ICD-10-CM

## 2013-02-14 LAB — BASIC METABOLIC PANEL
Calcium: 9.3 mg/dL (ref 8.4–10.5)
GFR: 69.54 mL/min (ref >60.00)
Glucose, Bld: 82 mg/dL (ref 70–99)
Sodium: 135 mEq/L (ref 135–145)

## 2013-02-14 NOTE — Telephone Encounter (Signed)
Weight= 173.0# today.  She states her edema is unchanged (slight left ankle).  She denies sob.

## 2013-02-18 NOTE — Telephone Encounter (Signed)
Weight=170.1# today.  No change in edema and she denies sob.

## 2013-02-19 ENCOUNTER — Telehealth: Payer: Self-pay

## 2013-02-19 NOTE — Telephone Encounter (Signed)
Weight today=171.2#.  No change in edema.  No sob.

## 2013-02-20 NOTE — Telephone Encounter (Signed)
Weight today=170.1.  No change in edema and she denies sob.

## 2013-02-21 MED ORDER — POTASSIUM CHLORIDE CRYS ER 20 MEQ PO TBCR: EXTENDED_RELEASE_TABLET | ORAL | Status: DC

## 2013-02-21 MED ORDER — METOLAZONE 2.5 MG PO TABS: ORAL_TABLET | ORAL | Status: DC

## 2013-02-21 MED ORDER — FUROSEMIDE 80 MG PO TABS: ORAL_TABLET | ORAL | Status: DC

## 2013-02-21 NOTE — Telephone Encounter (Signed)
Weight=173.1# today.  She states her edema is unchanged and she denies sob.

## 2013-02-25 NOTE — Telephone Encounter (Signed)
Weight=170.1#.  No change in edema or sob.

## 2013-02-27 NOTE — Telephone Encounter (Signed)
Weight today=174.1#.  No change in edema and no sob.

## 2013-02-28 NOTE — Telephone Encounter (Signed)
She took her zaroxolyn last night and will take it again tonight along with her pm Lasix dose for a weight over 172#.

## 2013-02-28 NOTE — Telephone Encounter (Signed)
Weight=173.1#.  Edema and sob are unchanged.

## 2013-03-01 ENCOUNTER — Telehealth: Payer: Self-pay

## 2013-03-01 NOTE — Telephone Encounter (Signed)
**Note De-Identified [name] Obfuscation** Weight= 172.1 edema and SOB is unchanged.

## 2013-03-06 NOTE — Telephone Encounter (Signed)
**Note De-Identified [name] Obfuscation** Weight= 173 lbs  Pt states she has slight edema in her left ankle and that her sob is unchanged.

## 2013-03-07 ENCOUNTER — Encounter: Payer: Self-pay | Admitting: Cardiology

## 2013-03-07 ENCOUNTER — Ambulatory Visit (INDEPENDENT_AMBULATORY_CARE_PROVIDER_SITE_OTHER): Payer: Medicare Other | Admitting: Cardiology

## 2013-03-07 VITALS — BP 122/62 | HR 92 | Ht <= 58 in | Wt 178.0 lb

## 2013-03-07 DIAGNOSIS — E876 Hypokalemia: Secondary | ICD-10-CM | POA: Diagnosis not present

## 2013-03-07 DIAGNOSIS — L0291 Cutaneous abscess, unspecified: Secondary | ICD-10-CM

## 2013-03-07 DIAGNOSIS — L0390 Cellulitis, unspecified: Secondary | ICD-10-CM

## 2013-03-07 DIAGNOSIS — I4430 Unspecified atrioventricular block: Secondary | ICD-10-CM

## 2013-03-07 DIAGNOSIS — I5032 Chronic diastolic (congestive) heart failure: Secondary | ICD-10-CM | POA: Insufficient documentation

## 2013-03-07 DIAGNOSIS — I509 Heart failure, unspecified: Secondary | ICD-10-CM | POA: Diagnosis not present

## 2013-03-07 NOTE — Assessment & Plan Note (Signed)
We continued to have some difficulties with low potassium. She is receiving potassium and he will be checked again today.

## 2013-03-07 NOTE — Assessment & Plan Note (Signed)
Historically the patient developed superficial cellulitis when she has marked edema. Fortunately she is not having this problem currently.

## 2013-03-07 NOTE — Patient Instructions (Signed)
**Note De-identified Cici Rodriges Obfuscation** Your physician recommends that you continue on your current medications as directed. Please refer to the Current Medication list given to you today.  Your physician recommends that you return for lab work in: today  Your physician wants you to follow-up in: 6 months. You will receive a reminder letter in the mail two months in advance. If you don't receive a letter, please call our office to schedule the follow-up appointment.   

## 2013-03-07 NOTE — Telephone Encounter (Signed)
Weight 178 lbs at OV with Dr. Myrtis Ser today.

## 2013-03-07 NOTE — Progress Notes (Signed)
HPI  Patient is seen today for followup Of chronic diastolic CHF. She is actually doing remarkably well. Over the years we tried for prolonged periods to reduce the edema in her legs. Ultimately a pacemaker was placed and this helped some. Now my nursing staff calls her on a regular basis to oversee her diuretics. By doing this she has done extremely well. We have kept her out of the hospital and today she has no significant edema.  Allergies  Allergen Reactions  . Aspirin     REACTION: rash    Current Outpatient Prescriptions  Medication Sig Dispense Refill  . BACTROBAN 2 % Apply 1 application topically as needed.       . Cholecalciferol (VITAMIN D3) 1000 UNITS CAPS Take 1 capsule by mouth daily.        . cyclobenzaprine (FLEXERIL) 5 MG tablet Take 5-10 mg by mouth 2 (two) times daily as needed. For muscle spasm.      . diazepam (VALIUM) 5 MG tablet Take 5 mg by mouth 2 (two) times daily as needed. For anxiety.      . furosemide (LASIX) 80 MG tablet TAKE ONE TABLET BY MOUTH TWICE DAILY  60 tablet  10  . HYDROcodone-acetaminophen (NORCO) 10-325 MG per tablet as directed.      . meloxicam (MOBIC) 15 MG tablet Take 15 mg by mouth daily.        . metolazone (ZAROXOLYN) 2.5 MG tablet Take one tablet with Furosemide when your weight is 172# or greater  30 tablet  2  . Multiple Vitamins-Minerals (MULTIVITAMINS THER. W/MINERALS) TABS Take 1 tablet by mouth daily.        . potassium chloride SA (K-DUR,KLOR-CON) 20 MEQ tablet Take 2 tabs by mouth in the am and 2 tabs in the pm.  120 tablet  11   No current facility-administered medications for this visit.    History   Social History  . Marital Status: Widowed    Spouse Name: N/A    Number of Children: N/A  . Years of Education: N/A   Occupational History  . Retired    Social History Main Topics  . Smoking status: Never Smoker   . Smokeless tobacco: Not on file  . Alcohol Use: No  . Drug Use: No  . Sexually Active: Not on file    Other Topics Concern  . Not on file   Social History Narrative   Retired    Widowed    Tobacco Use - No.    Alcohol Use - no   Regular Exercise - no   Drug Use - no    Family History  Problem Relation Age of Onset  . Cardiomyopathy Father   . Diabetes Father   . Cancer       son  . Coronary artery disease      son    Past Medical History  Diagnosis Date  . Edema     . EF 65-70%, echo, November 29, 2010  . Pulmonary hypertension      46 mmHg... echo... January, 2012  . Cellulitis     Superficial cellulitis right lower leg   January 14, 2011  . AV block      2:1  January 14, 2011 / pacemaker plan when cellulitis resolved in length  . Bradycardia     2:1  AV block  . Hypokalemia     October, 2012  . Ejection fraction     EF 65%, echo, January, 2012  .  Murmur     No significant valvular disease by echo, January, 2012  . Pacemaker     January, 2013    Past Surgical History  Procedure Laterality Date  . Partial hysterectomy    . Hip arthroplasty      total    Patient Active Problem List  Diagnosis  . Edema  . AV block  . Cellulitis  . Pulmonary hypertension  . Ejection fraction  . Murmur  . Hypokalemia  . Hypertension  . Cardiac pacemaker  . Pacemaker    ROS   Patient denies fever, chills, headache, sweats, rash, change in vision, change in hearing, chest pain, cough, nausea vomiting, urinary symptoms. All other systems are reviewed and are negative.  PHYSICAL EXAM  Patient is oriented to person time and place. Affect is normal. She's here with her daughter who is, from [place] today. She is in a wheelchair. She does walk in her house at home. She is overweight. There is no jugulovenous distention. Lungs are clear. Respiratory effort is nonlabored. Cardiac exam reveals S1 and S2. There no clicks or significant murmurs. The abdomen is soft. She has no significant peripheral edema today.  Filed Vitals:   03/07/13 1532  BP: 122/62   Pulse: 92  Height: 4\' 5"  (1.346 m)  Weight: 178 lb (80.74 kg)  SpO2: 97%    EKG  ASSESSMENT & PLAN

## 2013-03-07 NOTE — Assessment & Plan Note (Signed)
The patient's pacemaker is working well. She is followed by Dr. Ladona Ridgel.

## 2013-03-07 NOTE — Assessment & Plan Note (Signed)
She has chronic diastolic heart failure. We call her very regularly to help with her diuretic adjustments. This is stable. She needs her chemistry labs checked today.

## 2013-03-08 ENCOUNTER — Other Ambulatory Visit: Payer: Self-pay

## 2013-03-08 DIAGNOSIS — I10 Essential (primary) hypertension: Secondary | ICD-10-CM

## 2013-03-08 LAB — BASIC METABOLIC PANEL
Chloride: 95 mEq/L — ABNORMAL LOW (ref 96–112)
Creatinine, Ser: 1.3 mg/dL — ABNORMAL HIGH (ref 0.4–1.2)
Potassium: 2.6 mEq/L — CL (ref 3.5–5.1)

## 2013-03-08 NOTE — Telephone Encounter (Signed)
**Note De-Identified [name] Obfuscation** Weight= 173 lbs   Edema and SOB is unchanged.

## 2013-03-13 ENCOUNTER — Telehealth: Payer: Self-pay

## 2013-03-13 NOTE — Telephone Encounter (Signed)
Weight= 173 lbs.  Pt states that she has increased swelling in left ankle and no sob at this time. Pt will take Zaroxolyn dose today.

## 2013-03-14 NOTE — Telephone Encounter (Signed)
**Note De-Identified Ayeden Gladman Obfuscation** Weight= 173 lbs. Pt c/o left ankle edema that is unchanged and no sob. Pt will take dose of Zaroxolyn today.

## 2013-03-15 NOTE — Telephone Encounter (Signed)
Weight= 175 lbs  Pt states that she has some edema in ankles and no sob. Pt will take Zaroxolyn dose today.

## 2013-03-18 ENCOUNTER — Other Ambulatory Visit (INDEPENDENT_AMBULATORY_CARE_PROVIDER_SITE_OTHER): Payer: Medicare Other

## 2013-03-18 DIAGNOSIS — I10 Essential (primary) hypertension: Secondary | ICD-10-CM | POA: Diagnosis not present

## 2013-03-18 LAB — BASIC METABOLIC PANEL
Chloride: 98 mEq/L (ref 96–112)
Creatinine, Ser: 1.4 mg/dL — ABNORMAL HIGH (ref 0.4–1.2)
Potassium: 3.1 mEq/L — ABNORMAL LOW (ref 3.5–5.1)
Sodium: 138 mEq/L (ref 135–145)

## 2013-03-19 ENCOUNTER — Other Ambulatory Visit: Payer: Self-pay | Admitting: *Deleted

## 2013-03-19 DIAGNOSIS — E876 Hypokalemia: Secondary | ICD-10-CM

## 2013-03-19 MED ORDER — POTASSIUM CHLORIDE CRYS ER 20 MEQ PO TBCR: EXTENDED_RELEASE_TABLET | ORAL | Status: DC

## 2013-03-20 NOTE — Telephone Encounter (Signed)
**Note De-Identified Rudell Ortman Obfuscation** Weight= 178 lbs  Pt c/o edema in left leg and sob is unchanged. Pt will take Zaroxolyn dose today.

## 2013-03-22 ENCOUNTER — Telehealth: Payer: Self-pay

## 2013-03-22 NOTE — Telephone Encounter (Addendum)
03/21/13 Weight= 172 lbs   Pt states that she has slight edema in left foot and no sob.   Weight= 173 lbs   Pt states that her edema and sob is unchanged. Pt will take Zaroxlyn dose today.

## 2013-03-25 NOTE — Telephone Encounter (Signed)
Weight= 178 lbs.   Pt states there has been no change in edema in her left ankle or SOB. Pt will take Zaroxolyn dose today.

## 2013-03-27 NOTE — Telephone Encounter (Signed)
**Note De-Identified Lawton Dollinger Obfuscation** Weight= 171 lbs.  Pt states that the edema in her left ankle is unchanged and that she has no SOB at this time.

## 2013-03-28 NOTE — Telephone Encounter (Signed)
Weight= 173 lbs. Pt reports no change in edema or SOB. Pt took Zaroxolyn dose today.

## 2013-04-01 NOTE — Telephone Encounter (Signed)
**Note De-Identified Anup Brigham Obfuscation** Pts daughter is concerned about pt being dizzy and asked that I call the pt. Pt states that she has occasional dizziness and is not dizzy at this time. Pt is advised to call us if she has persistent or worsening dizziness, she verbalized understanding.

## 2013-04-01 NOTE — Telephone Encounter (Signed)
New problem    Daughter was visiting mother this weekend. Has some question & concern she will like to discuss.

## 2013-04-02 ENCOUNTER — Telehealth: Payer: Self-pay | Admitting: Cardiology

## 2013-04-02 NOTE — Telephone Encounter (Signed)
New problem   Pt is wanting to know if she can come in Monday and have her labs drawn instead of Wednesday, b/c she isn't feeling well. Pt's daughter stated someone was suppose to called her back yesterday after talking to Dr Myrtis Ser to see if pt could wait. Please call pt.

## 2013-04-02 NOTE — Telephone Encounter (Signed)
Pt's daughter would like to know if pt comes in for blood work on Thursday instead of this Wednesday, the date of appointment, because pt is not feeling well . Pt's daughter is aware that is fine to come this week, and is not necessary to change the appointment date. Daughter verbalized understanding.

## 2013-04-03 ENCOUNTER — Other Ambulatory Visit: Payer: Medicare Other

## 2013-04-03 ENCOUNTER — Telehealth: Payer: Self-pay

## 2013-04-03 NOTE — Telephone Encounter (Signed)
**Note De-identified [name] Obfuscation** CHF call:  [id number] 

## 2013-04-04 ENCOUNTER — Other Ambulatory Visit (INDEPENDENT_AMBULATORY_CARE_PROVIDER_SITE_OTHER): Payer: Medicare Other

## 2013-04-04 DIAGNOSIS — E876 Hypokalemia: Secondary | ICD-10-CM | POA: Diagnosis not present

## 2013-04-04 LAB — BASIC METABOLIC PANEL
GFR: 50.32 mL/min — ABNORMAL LOW (ref >60.00)
Glucose, Bld: 119 mg/dL — ABNORMAL HIGH (ref 70–99)
Potassium: 3.4 mEq/L — ABNORMAL LOW (ref 3.5–5.1)
Sodium: 138 mEq/L (ref 135–145)

## 2013-04-05 NOTE — Telephone Encounter (Signed)
**Note De-Identified [name] Obfuscation** Weight=178 lbs  Pt reports no change in edema in left ankle and no sob. Pt will take Zaroxolyn dose today.

## 2013-04-08 NOTE — Telephone Encounter (Signed)
Weight= 178 lbs. Pt states that the edema in her left ankle has improved and she has no sob. Pt will take Zaroxolyn dose today.

## 2013-04-09 NOTE — Telephone Encounter (Signed)
**Note De-Identified [name] Obfuscation** Weight= 178 lbs. Pt reports no change in SOB and edema. Pt will take Zaroxolyn dose today.

## 2013-04-12 NOTE — Telephone Encounter (Signed)
**Note De-Identified [name] Obfuscation** Weight= 178 lbs. Pt c/o edema in left ankle and no sob at this time. Pt will take Zaroxolyn dose today.

## 2013-04-12 NOTE — Telephone Encounter (Deleted)
**Note De-Identified [name] Obfuscation** Weight= 187 lbs.  Pt states that she has slight SOB and no edema at this time. Pt will be having back surgery on 5/27 and will be in hosp for 4 to 5 days then in rehab facility for 2 weeks.

## 2013-04-16 NOTE — Telephone Encounter (Signed)
Weight= 178 lbs.  Pt states that she has edema in both ankles and feet (more in the left) and no SOB. Pt will take Zaroxolyn dose today.

## 2013-04-17 NOTE — Telephone Encounter (Signed)
Weight=173 lbs.  Pt states that the edema in her left leg is better and she has no sob. Pt will take Zaroxolyn dose today.

## 2013-04-19 NOTE — Telephone Encounter (Signed)
**Note De-Identified [name] Obfuscation** Weight= 179 lbs.  Pt denies sob and states that her edema is better even though she has had a 6 lb. increase in weight since 5/28. Pt will take Zaroxolyn dose today.

## 2013-04-23 ENCOUNTER — Telehealth: Payer: Self-pay

## 2013-04-23 NOTE — Telephone Encounter (Signed)
Weight= 173 lbs. Pt states that the edema in her ankles have improved "a little bit" and she has no SOB at this time.

## 2013-04-25 NOTE — Telephone Encounter (Signed)
**Note De-Identified [name] Obfuscation** Weight= 173 lbs. Pt states that she continues to have edema in her left ankle and no SOB. Pt will take Zaroxolyn dose today.

## 2013-04-26 NOTE — Telephone Encounter (Signed)
Pts phone has been busy all afternoon, will continue to call.

## 2013-05-01 NOTE — Telephone Encounter (Signed)
Weight= 175 lbs.  Pt c/o edema in left ankle and no change in SOB. Pt will take Zaroxolyn dose today.

## 2013-05-02 NOTE — Telephone Encounter (Signed)
Weight= 175 lbs. Pt states that her edema is improved and no change in SOB. Pt will take Zaroxolyn dose today.

## 2013-05-03 NOTE — Telephone Encounter (Signed)
**Note De-Identified [name] Obfuscation** Pts phone continues to be busy. Will call pt next week.

## 2013-05-03 NOTE — Telephone Encounter (Signed)
**Note De-Identified [name] Obfuscation** CHF call: Pts phone has been busy for 2 hours, will continue to call.

## 2013-05-07 NOTE — Telephone Encounter (Signed)
Weight= 179 lbs.   Pt states that her edema is improved and she has no sob at this time.

## 2013-05-08 NOTE — Telephone Encounter (Signed)
**Note De-Identified [name] Obfuscation** Weight= 174 lbs. Pt reports that her edema and sob is unchanged from yesterday.

## 2013-05-10 NOTE — Telephone Encounter (Signed)
Weight= 173 lbs.  Pt states that her edema is better and she has no SOB at this time.

## 2013-05-13 NOTE — Telephone Encounter (Signed)
Weight= 171 lbs. Pt states that she has increased edema in left foot and no sob at this time.

## 2013-05-14 NOTE — Telephone Encounter (Signed)
**Note De-Identified [name] Obfuscation** Weight= 170 lbs.  Pt states that her edema is improved and she has no sob at this time.

## 2013-05-15 NOTE — Telephone Encounter (Signed)
Weight= 173 lbs.  Pt states that the dema in her left foot is better and denies SOB at this time.

## 2013-05-20 NOTE — Telephone Encounter (Signed)
Weight= 173 lbs.  Pt states that the edema in her left foot is improved and that she has no SOB at this time.

## 2013-05-23 ENCOUNTER — Encounter: Payer: Self-pay | Admitting: Cardiology

## 2013-05-23 ENCOUNTER — Other Ambulatory Visit: Payer: Self-pay | Admitting: Cardiology

## 2013-05-23 ENCOUNTER — Ambulatory Visit (INDEPENDENT_AMBULATORY_CARE_PROVIDER_SITE_OTHER): Payer: Medicare Other | Admitting: Cardiology

## 2013-05-23 VITALS — BP 122/60 | HR 89 | Ht <= 58 in | Wt 180.4 lb

## 2013-05-23 DIAGNOSIS — E876 Hypokalemia: Secondary | ICD-10-CM

## 2013-05-23 DIAGNOSIS — I441 Atrioventricular block, second degree: Secondary | ICD-10-CM | POA: Diagnosis not present

## 2013-05-23 DIAGNOSIS — Z950 Presence of cardiac pacemaker: Secondary | ICD-10-CM

## 2013-05-23 DIAGNOSIS — R29898 Other symptoms and signs involving the musculoskeletal system: Secondary | ICD-10-CM

## 2013-05-23 DIAGNOSIS — I5032 Chronic diastolic (congestive) heart failure: Secondary | ICD-10-CM | POA: Diagnosis not present

## 2013-05-23 LAB — PACEMAKER DEVICE OBSERVATION
ATRIAL PACING PM: 2.2
BAMS-0001: 150 {beats}/min
BRDY-0003RV: 120 {beats}/min
RV LEAD AMPLITUDE: 15.68 mv
RV LEAD IMPEDENCE PM: 463 Ohm
RV LEAD THRESHOLD: 0.75 V
VENTRICULAR PACING PM: 99.7

## 2013-05-23 LAB — BASIC METABOLIC PANEL
CO2: 32 mEq/L (ref 19–32)
Chloride: 100 mEq/L (ref 96–112)
Creatinine, Ser: 1.3 mg/dL — ABNORMAL HIGH (ref 0.4–1.2)

## 2013-05-23 NOTE — Progress Notes (Signed)
ELECTROPHYSIOLOGY OFFICE NOTE  Patient ID: [name] MRN: [id number], DOB/AGE: February 09, 1931   Date of Visit: 05/23/2013  Primary Physician: Alice Reichert, MD Primary Cardiologist / EP: Myrtis Ser, MD / Ladona Ridgel, MD Reason for Visit: EP/device follow-up  History of Present Illness  [name] is a 79 y.o. female with 2:1 AV block s/p PPM implant and chronic diastolic HF who presents today for routine electrophysiology followup. [name] is accompanied by her daughter and reports she has no new complaints other than mild LE weakness. She denies falls. Her weight at home has been stable between 173-175 lbs. This AM was 173.8 lbs. Dr. Myrtis Ser is adjusting her diuretic therapy with close follow-up (see telephone notes). She denies CP or SOB. She denies palpitations, dizziness, near syncope or syncope. She has chronic LE swelling which she states appears worse today because she has not taken her diuretic regimen (due to her visit today). She states her swelling always improves with Lasix and metolazone as directed by Dr. Melvenia Needles. She is compliant with her medications.  Past Medical History Past Medical History  Diagnosis Date  . Edema     . EF 65-70%, echo, November 29, 2010  . Pulmonary hypertension      46 mmHg... echo... January, 2012  . Cellulitis     Superficial cellulitis right lower leg   January 14, 2011  . AV block      2:1  January 14, 2011 / pacemaker plan when cellulitis resolved in length  . Bradycardia     2:1  AV block  . Hypokalemia     October, 2012  . Ejection fraction     EF 65%, echo, January, 2012  . Murmur     No significant valvular disease by echo, January, 2012  . Pacemaker     January, 2013  . Chronic diastolic CHF (congestive heart failure)     Past Surgical History Past Surgical History  Procedure Laterality Date  . Partial hysterectomy    . Hip arthroplasty      total    Allergies/Intolerances Allergies  Allergen Reactions  . Aspirin     REACTION:  rash   Current Home Medications Current Outpatient Prescriptions  Medication Sig Dispense Refill  . BACTROBAN 2 % Apply 1 application topically as needed.       . Cholecalciferol (VITAMIN D3) 1000 UNITS CAPS Take 1 capsule by mouth daily.        . cyclobenzaprine (FLEXERIL) 5 MG tablet Take 5-10 mg by mouth 2 (two) times daily as needed. For muscle spasm.      . diazepam (VALIUM) 5 MG tablet Take 5 mg by mouth 2 (two) times daily as needed. For anxiety.      . furosemide (LASIX) 80 MG tablet TAKE ONE TABLET BY MOUTH TWICE DAILY  60 tablet  10  . HYDROcodone-acetaminophen (NORCO) 10-325 MG per tablet as directed.      . meloxicam (MOBIC) 15 MG tablet Take 15 mg by mouth daily.        . metolazone (ZAROXOLYN) 2.5 MG tablet Take one tablet with Furosemide when your weight is 172# or greater  30 tablet  2  . Multiple Vitamins-Minerals (MULTIVITAMINS THER. W/MINERALS) TABS Take 1 tablet by mouth daily.        . potassium chloride SA (K-DUR,KLOR-CON) 20 MEQ tablet Take 2 tablets by mouth three times a day.  180 tablet  8   No current facility-administered medications for this visit.   Social History  History   Social History  . Marital Status: Widowed    Spouse Name: N/A    Number of Children: N/A  . Years of Education: N/A   Occupational History  . Retired    Social History Main Topics  . Smoking status: Never Smoker   . Smokeless tobacco: Not on file  . Alcohol Use: No  . Drug Use: No  . Sexually Active: Not on file   Other Topics Concern  . Not on file   Social History Narrative   Retired    Widowed    Tobacco Use - No.    Alcohol Use - no   Regular Exercise - no   Drug Use - no    Review of Systems General: No chills, fever, night sweats or weight changes Cardiovascular: No chest pain, dyspnea on exertion, edema, orthopnea, palpitations, paroxysmal nocturnal dyspnea Dermatological: No rash, lesions or masses Respiratory: No cough, dyspnea Urologic: No hematuria,  dysuria Abdominal: No nausea, vomiting, diarrhea, bright red blood per rectum, melena, or hematemesis Neurologic: No visual changes, weakness, changes in mental status All other systems reviewed and are otherwise negative except as noted above.  Physical Exam Vitals: Blood pressure 122/60, pulse 89, height 4\' 5"  (1.346 m), weight 180 lb 6.4 oz (81.829 kg), SpO2 99.00%.  General: Well developed, well appearing, overweight 79 y.o. female in no acute distress. HEENT: Normocephalic, atraumatic. EOMs intact. Sclera nonicteric. Oropharynx clear.  Neck: Supple. No JVD. Lungs: Shallow inspiration. Respirations regular and unlabored, CTA bilaterally. No wheezes, rales or rhonchi. Heart: RRR. S1, S2 present. No murmur, rub, S3 or S4. Abdomen: Soft, non-distended.  Extremities: No clubbing or cyanosis. 1+ pedal edema on right. 2+ pedal edema on left. PT/Radials 2+ and equal bilaterally. Psych: Normal affect. Neuro: Alert and oriented X 3. Moves all extremities spontaneously.   Diagnostics Device interrogation today - Normal device function. Thresholds, sensing, impedances consistent with previous measurements. Device programmed to maximize longevity. 128 mode switch episodes, <0.1% of time, longest 12 minutes 57 seconds, EGMs reviewed and no evidence of AT/AF. 1 VHR episode, 4 seconds in duration, EGM consistent with NSVT. Device programmed at appropriate safety margins. Histogram distribution appropriate for patient activity level. Estimated longevity 8.5 years  Assessment and Plan 1. 2:1 AV block s/p PPM implant Normal device function No programming changes made Return for routine device check and follow-up with Dr. Ladona Ridgel in 6 months 2. Chronic diastolic HF As outlined in the HPI, this is being followed closely by Dr. Myrtis Ser and she will continue making adjustments to her diuretic therapy as directed by Dr. Melvenia Needles. Given her LE weakness and h/o hypokalemia, I will check a BMET today. Otherwise she will  continue medical therapy and low sodium diet. She was counseled regarding the importance of medication compliance.   Signed, Rick Duff, PA-C 05/23/2013, 1:08 PM

## 2013-05-23 NOTE — Patient Instructions (Addendum)
Your physician recommends that you continue on your current medications as directed. Please refer to the Current Medication list given to you today.  CALL us IF YOU HAVE ANY QUESTIONS.  Your physician recommends that you return for lab work in: K+ CHECKED TODAY

## 2013-05-27 ENCOUNTER — Telehealth: Payer: Self-pay

## 2013-05-27 ENCOUNTER — Other Ambulatory Visit: Payer: Self-pay

## 2013-05-27 DIAGNOSIS — I10 Essential (primary) hypertension: Secondary | ICD-10-CM

## 2013-05-27 MED ORDER — POTASSIUM CHLORIDE CRYS ER 20 MEQ PO TBCR: EXTENDED_RELEASE_TABLET | ORAL | Status: DC

## 2013-05-27 NOTE — Telephone Encounter (Signed)
Weight= 183 lbs. Pt reports improving edema in left foot and no sob at this time. Pt took Zaroxolyn dose today due to weight gain. Per Tereso Newcomer PA-C and due to pts potassium level of 3.1 pt is advised to take 60 mg of Potassium in the am, 60 mg in the afternoon and 40 mg QHS and on days that she takes Zaroxolyn dose she is advised to take 60 mg TID and to repeat BMET next Monday (July 14). Pt verbalized understanding and agrees with plan.

## 2013-05-28 NOTE — Telephone Encounter (Signed)
New Prob    Daughter of pt would like to speak to nurse regarding last visit. Please call.

## 2013-05-28 NOTE — Telephone Encounter (Signed)
Weight= 174 lbs.   Pt states that the edema in her left ankle is improving and that she has no edema anywhere else and she has no SOB at this time.

## 2013-05-28 NOTE — Telephone Encounter (Signed)
metolazone (ZAROXOLYN) 2.5 MG tablet  Take one tablet with Furosemide when your weight is 172# or greater   30 tablet   Patient Instructions  Your physician recommends that you continue on your current medications as directed. Please refer to the Current Medication list given to you today.

## 2013-05-28 NOTE — Telephone Encounter (Signed)
Pts daughter is concerned about pts left ankle edema and wants to know if the pt should have some kind if therapy to help reduce swelling and she changed pts BMET appt to 7/15 from 7/14. She is advised that I will discuss with Dr. Myrtis Ser.

## 2013-05-31 NOTE — Telephone Encounter (Signed)
**Note De-Identified Ruvim Risko Obfuscation** Weight= 175 lbs. Pt states that her left ankle edema is better and she has no SOB at this time. Pt will take Zaroxolyn dose today due to weight being over 172 lbs.

## 2013-06-03 ENCOUNTER — Other Ambulatory Visit: Payer: Medicare Other

## 2013-06-04 ENCOUNTER — Other Ambulatory Visit: Payer: Medicare Other

## 2013-06-04 DIAGNOSIS — I10 Essential (primary) hypertension: Secondary | ICD-10-CM

## 2013-06-04 LAB — BASIC METABOLIC PANEL
Calcium: 9.5 mg/dL (ref 8.4–10.5)
GFR: 43.02 mL/min — ABNORMAL LOW (ref >60.00)
Glucose, Bld: 101 mg/dL — ABNORMAL HIGH (ref 70–99)
Potassium: 3.9 mEq/L (ref 3.5–5.1)
Sodium: 137 mEq/L (ref 135–145)

## 2013-06-19 ENCOUNTER — Encounter: Payer: Self-pay | Admitting: Cardiology

## 2013-06-19 NOTE — Telephone Encounter (Signed)
Weight= 181 lbs.  Pt states that her edema in left ankle is better and she denies SOB at this time. Pt took Zaroxolyn dose today.

## 2013-06-19 NOTE — Progress Notes (Signed)
   The patient requires high dose diuretics for her peripheral edema. When carefully dose this works quite well for her. However she gets marked hypokalemia and requires high dose potassium supplementation. I am always nervous about the amount of potassium needed, but this is her requirement. If her diuretic dosing changes over time, her potassium will need to be decreased.

## 2013-06-21 ENCOUNTER — Telehealth: Payer: Self-pay

## 2013-06-21 NOTE — Telephone Encounter (Signed)
**Note De-Identified [name] Obfuscation** CHF call:  No answer and no way to leave message, will call on Monday.

## 2013-06-24 ENCOUNTER — Other Ambulatory Visit: Payer: Self-pay | Admitting: Cardiology

## 2013-06-24 NOTE — Telephone Encounter (Signed)
Weight= 185 lbs  Pt states that the edema in her right thigh is a little worse and edema in left leg is better. She reports that she does have SOB with exertion.

## 2013-06-27 NOTE — Telephone Encounter (Signed)
**Note De-Identified [name] Obfuscation** Weight= 181 lbs.  Pt states that the edema in her right thigh and left ankle has improved slightly. Pt took Zaroxolyn dose today due to weight greater than 172 lbs.

## 2013-06-28 NOTE — Telephone Encounter (Addendum)
Weight= 182 lbs.  Pt states that the edema in her right thigh is improving but no change in edema in left ankle and no SOB at this time. Pt took Zaroxolyn dose today due to weight greater than 172 lbs.

## 2013-07-02 NOTE — Telephone Encounter (Signed)
Weight= 185 lbs.  Pt reports improvement in edema in right thigh and left ankle and she denies SOB at this time. Pt took Zaroxolyn dose today.

## 2013-07-04 NOTE — Telephone Encounter (Signed)
Follow Up      Pt calling returning your call from earlier. Please call back.

## 2013-07-04 NOTE — Telephone Encounter (Signed)
**Note De-Identified [name] Obfuscation** Weight= 188 lbs.  Pt states that she has increased edema in her left foot and ankle and no SOB at this time. Pt took Zaroxolyn dose today.

## 2013-07-05 DIAGNOSIS — M25519 Pain in unspecified shoulder: Secondary | ICD-10-CM | POA: Diagnosis not present

## 2013-07-05 DIAGNOSIS — Z79899 Other long term (current) drug therapy: Secondary | ICD-10-CM | POA: Diagnosis not present

## 2013-07-05 NOTE — Telephone Encounter (Signed)
**Note De-Identified Kathern Lobosco Obfuscation** Weight= 188 lbs.  Pt reports no change in his SOB or Edema at this time. Pt took Zaroxolyn dose today due to weight.

## 2013-07-09 NOTE — Telephone Encounter (Signed)
Weight= 172 lbs.   Pt states that the edema in her left ankle has some improvement and she has no sob at this time. Pt took Zaroxolyn dose today due to weight of 172 lbs.

## 2013-07-11 NOTE — Telephone Encounter (Signed)
Weight= 171 lbs.   Pt denies sob and states that the edema in her left ankle is better and she only has slight edema in right ankle this am.

## 2013-07-12 NOTE — Telephone Encounter (Signed)
**Note De-Identified [name] Obfuscation** Weight= 171 lbs. Pt states that she has some edema in her left ankle and right leg but that it is much improved from last week. Also, she states she has no SOB at this time.

## 2013-07-15 NOTE — Telephone Encounter (Signed)
**Note De-Identified Siniyah Evangelist Obfuscation** Pts number has been busy all afternoon, will call tomorrow.

## 2013-07-16 NOTE — Telephone Encounter (Signed)
No answer and no way to leave message. Will call tomorrow. 

## 2013-07-17 NOTE — Telephone Encounter (Signed)
**Note De-Identified Noel Rodier Obfuscation** No answer and no way to leave message. Also, I attempted to reach pts emergency contacts (both sons) without success.

## 2013-07-23 ENCOUNTER — Telehealth: Payer: Self-pay

## 2013-07-23 NOTE — Telephone Encounter (Signed)
**Note De-Identified [name] Obfuscation** Weight= 185 lbs.  Pt states that she continues to have swelling in her left ankle and no SOB at this time.

## 2013-07-25 NOTE — Telephone Encounter (Signed)
No answer and no way to leave message, will continue to call. 

## 2013-07-30 NOTE — Telephone Encounter (Signed)
**Note De-Identified [name] Obfuscation** Weight= 185 lbs.  Pt reports no change in her edema or SOB at this time. The pt states that she took Zaroxolyn dose this am due to weight of 185 lbs.

## 2013-07-31 NOTE — Telephone Encounter (Signed)
Weight= 180 lbs.  Pt reports no change in her edema or SOB at this time.

## 2013-08-01 NOTE — Telephone Encounter (Signed)
Weight= 182 lbs.  Pt reports no change in her edema in left ankle and no SOB at this time. The pt took Zaroxolyn dose today.

## 2013-08-05 NOTE — Telephone Encounter (Signed)
**Note De-Identified [name] Obfuscation** No answer and no way to leave message. Will call tomorrow.

## 2013-08-06 NOTE — Telephone Encounter (Signed)
Weight= 178 lbs.   Pt reports no change in her edema or SOB at this time. She did take Zaroxolyn dose today due to weight greater than 172 lbs.

## 2013-08-09 NOTE — Telephone Encounter (Signed)
Weight= 175 lbs.  Pt took Zaroxolyn dose today due to weight greater than 172 lbs. Pt states that her edema is better and that she has no SOB at this time.

## 2013-08-12 NOTE — Telephone Encounter (Signed)
**Note De-Identified Melquan Ernsberger Obfuscation** Phone busy all afternoon, will call tomorrow.

## 2013-08-13 NOTE — Telephone Encounter (Signed)
**Note De-Identified [name] Obfuscation** Weight= 175 lbs.  Pt states that the edema in her left leg and ankle is better and that she has no SOB at this time. Pt took Zaroxolyn dose today due to weight greater than 172 lbs.

## 2013-08-16 MED ORDER — METOLAZONE 2.5 MG PO TABS: 2.5000 mg | ORAL_TABLET | ORAL | Status: DC | PRN

## 2013-08-16 NOTE — Telephone Encounter (Signed)
**Note De-Identified [name] Obfuscation** Weight= 173 lbs.  Pt states that her edema is better and that she has no SOB at this time. Pt took Zaroxolyn dose due to weight greater than 172 lbs. today.

## 2013-08-19 NOTE — Telephone Encounter (Signed)
**Note De-Identified [name] Obfuscation** Weight= 173 lbs.  Pt states that she has some edema in her left ankle and right leg but that it is not bad. Pt took Zaroxolyn dose today due to weight greater than 172 lbs.

## 2013-08-20 NOTE — Telephone Encounter (Signed)
Weight= 174 lbs.  Pt reports that she has some edema in her right upper leg and left ankle and that she has no SOB at this time. Pt took Zaroxolyn dose today due to weight greater than 172 lbs.

## 2013-08-21 ENCOUNTER — Telehealth: Payer: Self-pay

## 2013-08-21 NOTE — Telephone Encounter (Signed)
**Note De-Identified [name] Obfuscation** CHF Call Weight= 173 lbs.  The pt states that the edema in her left ankle and right upper leg is a little better today and that she has no SOB at this time. Pt took Zaroxolyn dose today due to weight greater than 172 lbs.

## 2013-08-26 ENCOUNTER — Encounter: Payer: Self-pay | Admitting: Internal Medicine

## 2013-08-28 NOTE — Telephone Encounter (Signed)
Pts phone has been busy all afternoon. Will call back tomorrow.

## 2013-08-29 NOTE — Telephone Encounter (Signed)
Weight= 178 lbs.  The pt reports no changes in her left ankle edema and no SOB at this time.

## 2013-08-30 ENCOUNTER — Other Ambulatory Visit: Payer: Self-pay

## 2013-08-30 MED ORDER — FUROSEMIDE 80 MG PO TABS: ORAL_TABLET | ORAL | Status: DC

## 2013-08-30 MED ORDER — POTASSIUM CHLORIDE CRYS ER 20 MEQ PO TBCR: EXTENDED_RELEASE_TABLET | ORAL | Status: DC

## 2013-08-30 NOTE — Telephone Encounter (Signed)
Weight= 181 lbs.  The pt reports no change in her left ankle edema or sob at this time. The pt states that she thinks that her increase in weight is due to her having a new lady cooking her food and that she may be adding salt. Pt is reminded to avoid salt and to talk with the lady who is cooking for her and ask her not to add any salt to her food, she verbalized understanding.

## 2013-09-03 ENCOUNTER — Telehealth: Payer: Self-pay

## 2013-09-03 ENCOUNTER — Ambulatory Visit: Payer: Medicare Other | Admitting: Cardiology

## 2013-09-03 NOTE — Telephone Encounter (Signed)
Weight= 178 lbs.  The pt states that the edema in her ankles are better and she has no SOB at this time. The pt took Zaroxolyn dose today due to weight of 178 lbs.

## 2013-09-05 NOTE — Telephone Encounter (Signed)
**Note De-Identified [name] Obfuscation** Weight= 168 lbs.  Pt states that her left ankle edema is better today and she states she has no SOB at this time.

## 2013-09-06 NOTE — Telephone Encounter (Signed)
Weight= 165 lbs.  The pt reports no change in her edema or SOB since yesterday.

## 2013-09-09 ENCOUNTER — Encounter: Payer: Self-pay | Admitting: Cardiology

## 2013-09-09 ENCOUNTER — Ambulatory Visit (INDEPENDENT_AMBULATORY_CARE_PROVIDER_SITE_OTHER): Payer: Medicare Other | Admitting: Cardiology

## 2013-09-09 ENCOUNTER — Ambulatory Visit: Payer: Medicare Other | Admitting: Cardiology

## 2013-09-09 VITALS — BP 132/70 | HR 94 | Ht <= 58 in | Wt 185.0 lb

## 2013-09-09 DIAGNOSIS — Z950 Presence of cardiac pacemaker: Secondary | ICD-10-CM

## 2013-09-09 DIAGNOSIS — I10 Essential (primary) hypertension: Secondary | ICD-10-CM

## 2013-09-09 DIAGNOSIS — I509 Heart failure, unspecified: Secondary | ICD-10-CM | POA: Diagnosis not present

## 2013-09-09 DIAGNOSIS — I5032 Chronic diastolic (congestive) heart failure: Secondary | ICD-10-CM

## 2013-09-09 LAB — BASIC METABOLIC PANEL
BUN: 34 mg/dL — ABNORMAL HIGH (ref 6–23)
GFR: 60.85 mL/min (ref >60.00)
Glucose, Bld: 94 mg/dL (ref 70–99)
Potassium: 2.8 mEq/L — CL (ref 3.5–5.1)

## 2013-09-09 NOTE — Assessment & Plan Note (Signed)
Pacemaker continues to work well.
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
## 2013-09-09 NOTE — Assessment & Plan Note (Signed)
Blood pressure is controlled. No change in therapy. 

## 2013-09-09 NOTE — Patient Instructions (Addendum)
Your physician recommends that you return for lab work in: today  Your physician recommends that you take your Furosemide and Zaroxolyn dose when you get home today.  Your physician wants you to follow-up in: 6 months. You will receive a reminder letter in the mail two months in advance. If you don't receive a letter, please call our office to schedule the follow-up appointment.

## 2013-09-09 NOTE — Telephone Encounter (Addendum)
**Note De-Identified [name] Obfuscation** Pt's daughter, [name], is concerned about swelling in the pt's ankles and wants Dr Myrtis Ser to be aware. Dr Myrtis Ser advised.  Office weight= 185 Lbs. Pt had OV with Dr Myrtis Ser today and a BMET was ordered. The pts potassium level was low at 2.8. Per Dr Myrtis Ser the pts daughter, [name], is advised that the pt needs to increase her Potassium dose to 60 mg TID X 3 days then go back to her reg. dose of 60 mg at breakfast and afternoon and 40 mg at bedtime. [name] verbalized understanding and states she will advise the pt.

## 2013-09-09 NOTE — Telephone Encounter (Signed)
New Problem  Daughter requests to speak with the Nurse before Pt's appt @ 1:45pm today. Please call

## 2013-09-09 NOTE — Assessment & Plan Note (Signed)
Patient says that [name] weight was as low as 165 on [name] scale on September 06, 2013.. On our scale is 181 today. It is extremely difficult to oversee [name] weights. In addition she takes high-dose potassium. Chemistry will be checked today. We will continue to communicate with the patient and continue to use the same dry weight at home goal of 172 pounds.

## 2013-09-12 NOTE — Telephone Encounter (Signed)
**Note De-Identified [name] Obfuscation** Weight= 174 lbs.  The pt states that the edema in her left ankle is a "little better" today and that she has no SOB at this time. The pt took Zaroxolyn dose today due to weight greater than 172 lbs.

## 2013-09-13 DIAGNOSIS — I509 Heart failure, unspecified: Secondary | ICD-10-CM | POA: Diagnosis not present

## 2013-09-13 DIAGNOSIS — M549 Dorsalgia, unspecified: Secondary | ICD-10-CM | POA: Diagnosis not present

## 2013-09-13 NOTE — Telephone Encounter (Signed)
No answer and no way to leave message. 

## 2013-09-16 NOTE — Telephone Encounter (Signed)
Weight= 173 lbs. The pt states that she has some edema in her left ankle but that is it better than last week and no SOB at this time. The pt took Zaroxolyn dose today due to weight greater than 172 lbs.

## 2013-09-17 NOTE — Telephone Encounter (Signed)
**Note De-Identified [name] Obfuscation** Weight= 173 lbs.  The pt states that her edema is better today and that she has no SOB at this time. The pt took Zaroxolyn dose today due to weight greater than 172 lbs.

## 2013-09-20 NOTE — Telephone Encounter (Signed)
**Note De-Identified [name] Obfuscation** No answer and no way to leave message. Will call on Tuesday next week.

## 2013-09-24 ENCOUNTER — Telehealth: Payer: Self-pay

## 2013-09-24 NOTE — Telephone Encounter (Signed)
CHF call:  LMTCB 

## 2013-09-25 NOTE — Telephone Encounter (Signed)
**Note De-Identified Odes Lolli Obfuscation** Weight= 171 lbs.  The pt states that she has swelling in her left ankle and no sob at this time.

## 2013-09-26 NOTE — Telephone Encounter (Signed)
Weight= 173 lbs.  The pt states that she continues to have some edema in her left ankle but that it is better and that she has little SOB at this time. The pt took a Zaroxolyn dose today due to weight greater than 172 lbs.

## 2013-09-30 NOTE — Telephone Encounter (Addendum)
**Note De-Identified [name] Obfuscation** Weight= 173 lbs.  The pt states that she continues to have edema in her left ankle but that "it is not bad" and that she has no SOB at this time. The pt took a Zaroxolyn dose today due to weight of 173 lbs.

## 2013-10-01 NOTE — Telephone Encounter (Signed)
**Note De-Identified Antwain Caliendo Obfuscation** Weight= 173 lbs.  The pt states that she has "puffy ankles" and no SOB at this time. The pt took Zaroxolyn dose today due to weight of 173 lbs.

## 2013-10-04 NOTE — Telephone Encounter (Signed)
**Note De-Identified Padraic Marinos Obfuscation** Weight= 173 lbs.  The pt states that she has mild edema in her ankles and no SOB at this time. The pt took a Zaroxolyn dose today due to weight of 173 lbs.

## 2013-10-08 NOTE — Telephone Encounter (Signed)
Weight= 178 lbs.  Ms. Carnevale states that her edema is better and that she has no SOB at this time. The pt states that she has been eating foods that may have more salt than she normally eats but that she is not adding any salt to her food. The pt is reminded to ask the lady that prepares her food to watch the sodium content in the foods that she is preparing for the pt. She verbalized understanding and states she will discuss this with her again.

## 2013-10-09 NOTE — Telephone Encounter (Signed)
**Note De-Identified [name] Obfuscation** Weight= 172 lbs.  The pt states that she has edema in her left leg from knee down to her foot but that "it is better than it was" and no SOB at this time. The pt took Zaroxolyn dose today due to weight of 172 lbs.

## 2013-10-11 NOTE — Telephone Encounter (Signed)
Weight= 171 lbs.  The pt states that she continues to have left leg edema but that it is better today and that she has no SOB at this time.

## 2013-10-14 NOTE — Telephone Encounter (Signed)
Weight= 171 lbs.  The pt reports no change in her edema or SOB at this time.

## 2013-10-15 NOTE — Telephone Encounter (Signed)
**Note De-Identified Angle Karel Obfuscation** Weight= 171 lbs.  The pt reports no changes in her left ankle edema and that she has no SOB at this time.

## 2013-10-16 NOTE — Telephone Encounter (Signed)
Weight= 173 lbs. The pt states that her left ankle edema is better today and that she has no SOB at this time. She did take a Zaroxolyn dose due to weight greater than 172 lbs.

## 2013-10-21 ENCOUNTER — Telehealth: Payer: Self-pay

## 2013-10-21 NOTE — Telephone Encounter (Signed)
**Note De-Identified [name] Obfuscation** Weight= 173 lbs.  The pt states that she has mild edema in her left ankle and no SOB at this time. The pt took a Zaroxolyn dose today due to weight greater than 172 lbs.

## 2013-10-23 NOTE — Telephone Encounter (Signed)
Weight= 172 lbs.  The pt states that her ankle edema is better today and that she has no SOB at this time. The pt took a Zaroxolyn dose today due to weight of 172 lbs.

## 2013-10-24 NOTE — Telephone Encounter (Signed)
Weight= 173 lbs.   The pt states that the edema in her left leg/ankle is better and that she has no SOB at this time. The pt took a Zaroxolyn dose today due to weight greater than 172 lbs.

## 2013-10-28 NOTE — Telephone Encounter (Signed)
No answer and no way to leave message X 2.

## 2013-10-30 NOTE — Telephone Encounter (Signed)
**Note De-Identified [name] Obfuscation** Weight= 171 lbs. The pt states that the edema in her left ankle is better today and that she has no SOB at this time.

## 2013-10-31 NOTE — Telephone Encounter (Signed)
Weight= 170 lbs.  The pt states that she does have swelling in her left ankle but that it is better than it has been and that she has no SOB at this time.

## 2013-11-05 DIAGNOSIS — J069 Acute upper respiratory infection, unspecified: Secondary | ICD-10-CM | POA: Diagnosis not present

## 2013-11-05 DIAGNOSIS — J209 Acute bronchitis, unspecified: Secondary | ICD-10-CM | POA: Diagnosis not present

## 2013-11-05 DIAGNOSIS — M549 Dorsalgia, unspecified: Secondary | ICD-10-CM | POA: Diagnosis not present

## 2013-11-11 NOTE — Telephone Encounter (Signed)
**Note De-Identified [name] Obfuscation** Weight= 170 lbs. The pt states that she has edema in her left ankle and right foot but that she has no SOB at this time.

## 2013-11-12 NOTE — Telephone Encounter (Signed)
Weight= 171 lbs.  The pt states that the edema in her left ankle and right foot is better today and that she has no SOB at this time.

## 2013-11-27 NOTE — Telephone Encounter (Signed)
**Note De-Identified Kadrian Partch Obfuscation** 11/25/13  Weight= 172 lbs.   The pt states that she has increased swelling in her left ankle and no SOB at this time.  11/27/13  Phone busy.

## 2013-11-29 NOTE — Telephone Encounter (Signed)
Weight= 171 lbs.  The pt states that she has left leg and ankle edema and no sob at this time.

## 2013-12-02 NOTE — Telephone Encounter (Signed)
**Note De-Identified [name] Obfuscation** Weight= 171 lbs.  The pt states that she has mild edema in her left ankle and no SOB at this time.

## 2013-12-03 NOTE — Telephone Encounter (Signed)
Weight= 173  The pt states that the edema in her left ankle is the same and no SOB at this time. The pt will take a Zaroxolyn dose today due to weight greater than 172 lbs.

## 2013-12-06 NOTE — Telephone Encounter (Signed)
**Note De-identified [name] Obfuscation** No answer and no way to leave message. 

## 2013-12-10 ENCOUNTER — Encounter: Payer: Self-pay | Admitting: Internal Medicine

## 2013-12-10 ENCOUNTER — Encounter (INDEPENDENT_AMBULATORY_CARE_PROVIDER_SITE_OTHER): Payer: Self-pay

## 2013-12-10 ENCOUNTER — Ambulatory Visit (INDEPENDENT_AMBULATORY_CARE_PROVIDER_SITE_OTHER): Payer: Medicare Other | Admitting: Internal Medicine

## 2013-12-10 VITALS — BP 126/64 | HR 98 | Ht <= 58 in

## 2013-12-10 DIAGNOSIS — I4430 Unspecified atrioventricular block: Secondary | ICD-10-CM | POA: Diagnosis not present

## 2013-12-10 DIAGNOSIS — Z950 Presence of cardiac pacemaker: Secondary | ICD-10-CM

## 2013-12-10 DIAGNOSIS — I509 Heart failure, unspecified: Secondary | ICD-10-CM

## 2013-12-10 DIAGNOSIS — I5032 Chronic diastolic (congestive) heart failure: Secondary | ICD-10-CM | POA: Diagnosis not present

## 2013-12-10 DIAGNOSIS — R609 Edema, unspecified: Secondary | ICD-10-CM

## 2013-12-10 DIAGNOSIS — E876 Hypokalemia: Secondary | ICD-10-CM | POA: Diagnosis not present

## 2013-12-10 LAB — BASIC METABOLIC PANEL
BUN: 41 mg/dL — AB (ref 6–23)
CHLORIDE: 97 meq/L (ref 96–112)
CO2: 29 mEq/L (ref 19–32)
Calcium: 9.2 mg/dL (ref 8.4–10.5)
Creatinine, Ser: 1.2 mg/dL (ref 0.4–1.2)
GFR: 58.38 mL/min — AB (ref >60.00)
Glucose, Bld: 106 mg/dL — ABNORMAL HIGH (ref 70–99)
POTASSIUM: 3.1 meq/L — AB (ref 3.5–5.1)
SODIUM: 136 meq/L (ref 135–145)

## 2013-12-10 NOTE — Assessment & Plan Note (Signed)
We'll plan to check her potassium level today. She is on supplements.

## 2013-12-10 NOTE — Patient Instructions (Addendum)
Your physician wants you to follow-up in: 6 months with device clinic and 12 months with Dr Court Joy will receive a reminder letter in the mail two months in advance. If you don't receive a letter, please call our office to schedule the follow-up appointment.  Your physician recommends that you return for lab work today. BMET

## 2013-12-10 NOTE — Assessment & Plan Note (Signed)
Her Medtronic dual-chamber pacemaker is working normally. We'll plan to recheck in several months. 

## 2013-12-10 NOTE — Progress Notes (Signed)
HPI [name] returns today for followup. She is a very pleasant 80 year old woman with a history of hypertension and symptomatic 2:1 heart block, status post pacemaker insertion. She has a history of chronic lower extremity venous stasis and cellulitis. In the interim, she has been stable. She has become progressively more sedentary. She can transfer and ambulate inside the house but very slowly. She has peripheral edema which is chronic. She is on fairly high dose diuretic therapy and potassium supplementation. No chest pain. No syncope. Allergies  Allergen Reactions  . Aspirin     REACTION: rash     Current Outpatient Prescriptions  Medication Sig Dispense Refill  . BACTROBAN 2 % Apply 1 application topically as needed.       . Cholecalciferol (VITAMIN D3) 1000 UNITS CAPS Take 1 capsule by mouth daily.        . cyclobenzaprine (FLEXERIL) 5 MG tablet Take 5-10 mg by mouth 2 (two) times daily as needed. For muscle spasm.      . diazepam (VALIUM) 5 MG tablet Take 5 mg by mouth 2 (two) times daily as needed. For anxiety.      . furosemide (LASIX) 80 MG tablet TAKE ONE TABLET BY MOUTH TWICE DAILY  60 tablet  3  . HYDROcodone-acetaminophen (NORCO) 10-325 MG per tablet as directed.      . meloxicam (MOBIC) 15 MG tablet Take 15 mg by mouth daily.        . metolazone (ZAROXOLYN) 2.5 MG tablet Take 1 tablet (2.5 mg total) by mouth as needed.  30 tablet  2  . Multiple Vitamins-Minerals (MULTIVITAMINS THER. W/MINERALS) TABS Take 1 tablet by mouth daily.        . potassium chloride SA (K-DUR,KLOR-CON) 20 MEQ tablet Take 60 mg in the am, 60 mg in the afternoon and 40 mg qhs daily. On days that you take Zaroxolyn take Potassium 60 mg three times daily  260 tablet  3   No current facility-administered medications for this visit.     Past Medical History  Diagnosis Date  . Edema     . EF 65-70%, echo, November 29, 2010  . Pulmonary hypertension      46 mmHg... echo... January, 2012  .  Cellulitis     Superficial cellulitis right lower leg   January 14, 2011  . AV block      2:1  January 14, 2011 / pacemaker plan when cellulitis resolved in length  . Bradycardia     2:1  AV block  . Hypokalemia     October, 2012  . Ejection fraction     EF 65%, echo, January, 2012  . Murmur     No significant valvular disease by echo, January, 2012  . Pacemaker     January, 2013  . Chronic diastolic CHF (congestive heart failure)     ROS:   All systems reviewed and negative except as noted in the HPI.   Past Surgical History  Procedure Laterality Date  . Partial hysterectomy    . Hip arthroplasty      total     Family History  Problem Relation Age of Onset  . Cardiomyopathy Father   . Diabetes Father   . Cancer       son  . Coronary artery disease      son     History   Social History  . Marital Status: Widowed    Spouse Name: N/A    Number  of Children: N/A  . Years of Education: N/A   Occupational History  . Retired    Social History Main Topics  . Smoking status: Never Smoker   . Smokeless tobacco: Not on file  . Alcohol Use: No  . Drug Use: No  . Sexual Activity: Not on file   Other Topics Concern  . Not on file   Social History Narrative   Retired    Widowed    Tobacco Use - No.    Alcohol Use - no   Regular Exercise - no   Drug Use - no     BP 126/64  Pulse 98  Ht 4\' 5"  (1.346 m)  Physical Exam:  Well appearing morbidly obese, NAD HEENT: Unremarkable Neck:  No JVD, no thyromegally Back:  No CVA tenderness Lungs:  Clear with no wheezes, rales, or rhonchi. HEART:  Regular rate rhythm, no murmurs, no rubs, no clicks Abd:  soft, positive bowel sounds, no organomegally, no rebound, no guarding Ext:  2 plus pulses, no edema, no cyanosis, no clubbing Skin:  No rashes no nodules Neuro:  CN II through XII intact, motor grossly intact   DEVICE  Normal device function.  See PaceArt for details.   Assess/Plan:

## 2013-12-10 NOTE — Assessment & Plan Note (Signed)
Her peripheral edema is fairly well controlled. I've encouraged the patient to reduce her salt intake.

## 2013-12-11 NOTE — Telephone Encounter (Signed)
**Note De-Identified [name] Obfuscation** Weight= 174 lbs.  The pt states that she has mild left ankle edema and no SOB at this time. She took a Zaroxolyn dose today due to weight greater than 172 lbs.

## 2013-12-13 ENCOUNTER — Encounter: Payer: Medicare Other | Admitting: Internal Medicine

## 2013-12-13 NOTE — Telephone Encounter (Signed)
**Note De-Identified Keeven Matty Obfuscation** 1/22  Weight= 173 lbs.  The pt states that the edema in her left foot is better and that she has no SOB at this time. The pt took a Zaroxolyn dose today due to weight greater than 172 lbs.  1/23  Weight= 171 lbs.  The pt states that her left foot edema is better and that she has no SOB at this time.

## 2013-12-18 ENCOUNTER — Other Ambulatory Visit: Payer: Self-pay

## 2013-12-18 MED ORDER — METOLAZONE 2.5 MG PO TABS: 2.5000 mg | ORAL_TABLET | ORAL | Status: DC | PRN

## 2013-12-20 NOTE — Telephone Encounter (Signed)
1/28 Weight= 173 lbs.  The pt states that the edema in her left ankle has increased and that she has no SOB at this time. The pt took a Zaroxolyn dose due to weight greater than 172 lbs. 1/29 Weight= 171 lbs. The pt states that she continues to have increased edema in her left ankle and no SOB at this time.  1/30 Weight= 172 lbs.  The pt reports that the edema in her left ankle is better and that she has no SOB at this time. The pt took a Zaroxolyn dose today due to weight of 172 lbs.

## 2013-12-25 ENCOUNTER — Telehealth: Payer: Self-pay

## 2013-12-25 NOTE — Telephone Encounter (Signed)
2/2 Weight= 170 lbs.  The pt states that the edema in her left ankle is better and that she has no SOB at this time. 2/3 Weight= 173 lbs.   The pt states that she has minor edema in her left ankle and no sob at this time. 2/4  No answer and no way to leave a message.

## 2013-12-26 ENCOUNTER — Other Ambulatory Visit: Payer: Self-pay | Admitting: Cardiology

## 2014-01-03 NOTE — Telephone Encounter (Signed)
2/10  Weight= 172 lbs.  The pt states that she has increased edema in her right leg and left ankle but that it is not bad and no SOB at this time. She took Zaroxolyn dose due to weight of 172 lbs. 2/12  Weight= 171 lbs.  The pt states that the edema in her right leg and left foot is better today. 2/13  Weight=  171 lbs.  The pt states that she has little edema in her right leg and left foot and no SOB at this time.

## 2014-01-09 ENCOUNTER — Other Ambulatory Visit: Payer: Self-pay

## 2014-01-09 MED ORDER — FUROSEMIDE 80 MG PO TABS: ORAL_TABLET | ORAL | Status: DC

## 2014-01-10 NOTE — Telephone Encounter (Signed)
2/12  Weight= 171 lbs.  The pt states that she has right leg and left ankle edema and no sob at this time. 2/13  Weight= 171 lbs.  The pt reports that her edema is better today and that she has no sob at this time. 2/19  Weight= 168 lbs.  The pt reports that her edema is better today and that she has no sob at this time. 2/20  Weight= 170 lbs.  The pt reports that her edema is better today and that she has no sob at this time.

## 2014-01-13 ENCOUNTER — Telehealth: Payer: Self-pay

## 2014-01-13 DIAGNOSIS — I10 Essential (primary) hypertension: Secondary | ICD-10-CM

## 2014-01-13 NOTE — Telephone Encounter (Signed)
**Note De-Identified Amahri Dengel Obfuscation** The pt c/o cramping in her legs. Her potassium level was low at 3.1 on 12/10/13. Per Dr Mayford Knife, DOD, the pt is advised to come to office to repeat BMET today. The pt states that she has to have someone drive her so she will not be able to come in today but will try to come early tomorrow morning. BMET ordered and scheduled to be drawn tomorrow.

## 2014-01-14 ENCOUNTER — Other Ambulatory Visit: Payer: Medicare Other

## 2014-01-24 ENCOUNTER — Other Ambulatory Visit: Payer: Medicare Other

## 2014-01-24 ENCOUNTER — Other Ambulatory Visit: Payer: Self-pay

## 2014-01-24 DIAGNOSIS — I10 Essential (primary) hypertension: Secondary | ICD-10-CM

## 2014-01-29 NOTE — Telephone Encounter (Signed)
**Note De-Identified [name] Obfuscation** 2/23 weight= 171 lbs.  The pt states that she has mild edema in her left foot and no sob at this time. 3/4   Weight=  171 lbs.  The pt reports no changes in her edema or sob at this time. 3/9   Pts phone busy all afternoon 3/10 Pts phone busy all afternoon I called the pts son, [name], and left a message on his VM stating that the pts phone has been busy for 2 days and asked that he check on her. I will continue to call.

## 2014-02-04 NOTE — Telephone Encounter (Signed)
**Note De-Identified [name] Obfuscation** 02/03/14  Weight= 171 lbs.  The pt states that the edema in her left leg and ankle has improved and that she has no SOB at this time. 02/04/14  Weight= 170 lbs.  The pt reports mild edema in her left ankle and foot and no SOB at this time.

## 2014-02-20 DIAGNOSIS — R609 Edema, unspecified: Secondary | ICD-10-CM | POA: Diagnosis not present

## 2014-02-20 DIAGNOSIS — E876 Hypokalemia: Secondary | ICD-10-CM | POA: Diagnosis not present

## 2014-02-20 DIAGNOSIS — I509 Heart failure, unspecified: Secondary | ICD-10-CM | POA: Diagnosis not present

## 2014-02-20 DIAGNOSIS — M25519 Pain in unspecified shoulder: Secondary | ICD-10-CM | POA: Diagnosis not present

## 2014-02-20 DIAGNOSIS — Z79899 Other long term (current) drug therapy: Secondary | ICD-10-CM | POA: Diagnosis not present

## 2014-02-21 DIAGNOSIS — E876 Hypokalemia: Secondary | ICD-10-CM | POA: Diagnosis not present

## 2014-02-21 DIAGNOSIS — M25519 Pain in unspecified shoulder: Secondary | ICD-10-CM | POA: Diagnosis not present

## 2014-02-21 DIAGNOSIS — I509 Heart failure, unspecified: Secondary | ICD-10-CM | POA: Diagnosis not present

## 2014-02-21 NOTE — Telephone Encounter (Signed)
02/05/14  Weight= 172 lbs.  The pt reports that she has increased edema in her feet and no sob at this time. 02/10/14  Weight= 173 lbs.  The pt reports no change in her edema or sob at this time. 02/11/14  Weight= 171 lbs  The pt states that she has mild edema in her left ankle and no sob at this time. 02/12/14  Weight= 171 lbs.  The pt reports that she has mild edema in her left ankle and no sob at this time. 02/17/14  Weight= 170 lbs.  The pt states that the edema in her left ankle is better and that she has no SOB at this time. 02/20/14  Weight= 170 lbs.  The pt reports no edema or SOB at this time. 02/21/14  Weight= 169 lbs.  The pts daughter, [name], states that the pt has no edema or SOB at this time.

## 2014-03-10 ENCOUNTER — Telehealth: Payer: Self-pay

## 2014-03-10 ENCOUNTER — Encounter: Payer: Self-pay | Admitting: Cardiology

## 2014-03-10 ENCOUNTER — Ambulatory Visit (INDEPENDENT_AMBULATORY_CARE_PROVIDER_SITE_OTHER): Payer: Medicare Other | Admitting: Cardiology

## 2014-03-10 VITALS — BP 141/82 | HR 102 | Ht 60.0 in | Wt 174.0 lb

## 2014-03-10 DIAGNOSIS — I10 Essential (primary) hypertension: Secondary | ICD-10-CM

## 2014-03-10 DIAGNOSIS — I509 Heart failure, unspecified: Secondary | ICD-10-CM

## 2014-03-10 DIAGNOSIS — I5032 Chronic diastolic (congestive) heart failure: Secondary | ICD-10-CM | POA: Diagnosis not present

## 2014-03-10 DIAGNOSIS — Z950 Presence of cardiac pacemaker: Secondary | ICD-10-CM

## 2014-03-10 LAB — BASIC METABOLIC PANEL
BUN: 30 mg/dL — ABNORMAL HIGH (ref 6–23)
CO2: 28 mEq/L (ref 19–32)
CREATININE: 0.9 mg/dL (ref 0.4–1.2)
Calcium: 9.5 mg/dL (ref 8.4–10.5)
Chloride: 98 mEq/L (ref 96–112)
GFR: 75.48 mL/min (ref >60.00)
Glucose, Bld: 114 mg/dL — ABNORMAL HIGH (ref 70–99)
Potassium: 2.8 mEq/L — CL (ref 3.5–5.1)
SODIUM: 140 meq/L (ref 135–145)

## 2014-03-10 MED ORDER — MAGNESIUM OXIDE 400 MG PO TABS: 400.0000 mg | ORAL_TABLET | Freq: Every day | ORAL | Status: DC

## 2014-03-10 NOTE — Progress Notes (Signed)
Patient ID: [name], female   DOB: [birth year], 80 y.o.   MRN: [id number]    HPI  Patient is seen today to followup chronic diastolic CHF. Her pacemaker was checked by Dr. Ladona Ridgelaylor in January, 2015. It is working well. Our nursing staff called the patient almost daily to help her decide when to take additional diuretics. This has kept her stable. If she has increased weight she gets pain in her legs and redness. With diuresis this improves.  Allergies  Allergen Reactions  . Aspirin     REACTION: rash    Current Outpatient Prescriptions  Medication Sig Dispense Refill  . BACTROBAN 2 % Apply 1 application topically as needed.       . cyclobenzaprine (FLEXERIL) 5 MG tablet Take 5-10 mg by mouth 2 (two) times daily as needed. For muscle spasm.      . diazepam (VALIUM) 5 MG tablet Take 5 mg by mouth 2 (two) times daily as needed. For anxiety.      . furosemide (LASIX) 80 MG tablet TAKE ONE TABLET BY MOUTH TWICE DAILY  60 tablet  6  . meloxicam (MOBIC) 15 MG tablet Take 15 mg by mouth daily.        . metolazone (ZAROXOLYN) 2.5 MG tablet Take 1 tablet (2.5 mg total) by mouth as needed.  30 tablet  6  . Multiple Vitamins-Minerals (MULTIVITAMINS THER. W/MINERALS) TABS Take 1 tablet by mouth daily.        . potassium chloride SA (K-DUR,KLOR-CON) 20 MEQ tablet TAKE 3 TABLETS IN THE AM, 3 TABLETS AT NOON, AND 2 AT BEDTIME. TAKE 3 TABS 3 TIMES DAILY WHEN TAKING ZAROXOLYN      . traMADol (ULTRAM) 50 MG tablet Take 50-100 mg by mouth every 4 (four) hours as needed.      . Cholecalciferol (VITAMIN D3) 1000 UNITS CAPS Take 1 capsule by mouth daily.         No current facility-administered medications for this visit.    History   Social History  . Marital Status: Widowed    Spouse Name: N/A    Number of Children: N/A  . Years of Education: N/A   Occupational History  . Retired    Social History Main Topics  . Smoking status: Never Smoker   . Smokeless tobacco: Not on file  . Alcohol Use:  No  . Drug Use: No  . Sexual Activity: Not on file   Other Topics Concern  . Not on file   Social History Narrative   Retired    Widowed    Tobacco Use - No.    Alcohol Use - no   Regular Exercise - no   Drug Use - no    Family History  Problem Relation Age of Onset  . Cardiomyopathy Father   . Diabetes Father   . Cancer       son  . Coronary artery disease      son    Past Medical History  Diagnosis Date  . Edema     . EF 65-70%, echo, November 29, 2010  . Pulmonary hypertension      46 mmHg... echo... January, 2012  . Cellulitis     Superficial cellulitis right lower leg   January 14, 2011  . AV block      2:1  January 14, 2011 / pacemaker plan when cellulitis resolved in length  . Bradycardia     2:1  AV block  . Hypokalemia  October, 2012  . Ejection fraction     EF 65%, echo, January, 2012  . Murmur     No significant valvular disease by echo, January, 2012  . Pacemaker     January, 2013  . Chronic diastolic CHF (congestive heart failure)     Past Surgical History  Procedure Laterality Date  . Partial hysterectomy    . Hip arthroplasty      total    Patient Active Problem List   Diagnosis Date Noted  . Chronic diastolic CHF (congestive heart failure)   . Pacemaker   . Hypertension 12/10/2011  . Cardiac pacemaker 12/10/2011  . Murmur   . Hypokalemia   . Ejection fraction   . Edema   . AV block   . Cellulitis   . Pulmonary hypertension     ROS   Patient denies fever, chills, headache, sweats, rash, change in vision, change in hearing, chest pain, cough, nausea vomiting, urinary symptoms. All other systems are reviewed and are negative.  PHYSICAL EXAM  Patient is oriented to person time and place. Affect is normal. She is overweight. She is in a wheelchair. There is no jugulovenous distention. Lungs are clear. Respiratory effort is nonlabored. Cardiac exam reveals S1 and S2. The abdomen is soft. She has 1+ peripheral edema. Her left leg  is slightly larger than the right. There is no redness.  Filed Vitals:   03/10/14 1209  BP: 141/82  Pulse: 102  Height: 5' (1.524 m)  Weight: 174 lb (78.926 kg)     ASSESSMENT & PLAN

## 2014-03-10 NOTE — Patient Instructions (Signed)
**Note De-identified Adalie Mand Obfuscation** Your physician recommends that you continue on your current medications as directed. Please refer to the Current Medication list given to you today.  Your physician recommends that you return for lab work in: today  Your physician wants you to follow-up in: 6 months. You will receive a reminder letter in the mail two months in advance. If you don't receive a letter, please call our office to schedule the follow-up appointment.   

## 2014-03-10 NOTE — Assessment & Plan Note (Signed)
We call the patient at home almost daily to help her adjust her diuretics. This helps to keep her stabilized. When her weight goes up she has pain in her ankles and developed redness. Today she looks great.

## 2014-03-10 NOTE — Assessment & Plan Note (Signed)
Her pacemaker is working well. No change in therapy

## 2014-03-10 NOTE — Assessment & Plan Note (Signed)
Blood pressure is controlled. No change in therapy. 

## 2014-03-10 NOTE — Telephone Encounter (Signed)
**Note De-Identified [name] Obfuscation** LMTCB Per Dr Myrtis Ser and due to the pt's potassium level of 2.8 the pt needs to start taking Magnesium Oxide 400 mg daily and repeat BMET in 2 weeks. Also we need to verify that the pt is taking her potassium as directed.   Magnesium Oxide RX sent to [facility] in [place] to fill and BMET has been ordered and if the pt agrees it is scheduled to be drawn on 5/4.

## 2014-03-11 NOTE — Telephone Encounter (Signed)
Pt is aware to start taking Magnesium oxide 400 mg once a day and repeat BMET on 5/4. Pt verbalized understanding. According to pt she takes 3/ 20 mEq potasium tablet in the AM, 3 tablets at noon and 2 tablets at bedtime; when she takes  Zaroxolyn 2.5 mg she take 3 potassium pills at bedtime.

## 2014-03-11 NOTE — Telephone Encounter (Signed)
Follow Up:  Pt is returning nurse's call

## 2014-03-17 NOTE — Telephone Encounter (Signed)
02/25/14 Weight= 169 lbs.  The pt reports increased left foot edema and no SOB at this time. 02/26/14 Weight= 169 lbs.  The pt continues to have increased edema in her left foot and no SOG at this time. 02/28/14 Weight= 172 lbs.  The pt reports no changes in left foot edema and no SOB at this time. She states that she took a Zaroxolyn dose today due to weight of 172 lbs. 03/03/14 Weight= 167 lbs.  The pt states that the edema in her left foot is improving and that she has no SOB at this time. 03/05/14 Weight= 167 lbs.  The pt states that her left foot edema is better and that she has no SOB at this time. 03/07/14 Weight= 165 lbs.  The pt denies any edema or SOB at this time. 03/10/14 OV Weight= 174 lbs. The pt had OV with Dr Myrtis Ser today-no changes made. 03/13/14 Weight= 172 lbs.  The pt states that she has increased edema in her left ankle and no SOB at this time. The pt took a Zaroxolyn dose due to weight of 172 lbs. 03/14/14 Weight= 171 lbs.  The pt states that the edema in her left ankle is better and no SOB at this time. 03/17/14  Weight= 169 lbs. The pt reports that she has edema in her left foot and ankle and no SOB at this time.

## 2014-03-20 NOTE — Telephone Encounter (Signed)
**Note De-Identified Malekai Markwood Obfuscation** 4/28  Weight= 167 lbs.  The pt reports no changes in her edema and SOB at this time. 4/30  Weight= 171 lbs.  The pt. states that the edema in her left ankle has increased "a little more" than it was and that she has no sob at this time. She took a Zaroxolyn dose today due to increased edema in her left ankle. The pt is reminded not to take Zaroxolyn unless her weight reaches 172 lbs, she verbalized understanding.

## 2014-03-24 ENCOUNTER — Other Ambulatory Visit (INDEPENDENT_AMBULATORY_CARE_PROVIDER_SITE_OTHER): Payer: Medicare Other

## 2014-03-24 DIAGNOSIS — I10 Essential (primary) hypertension: Secondary | ICD-10-CM | POA: Diagnosis not present

## 2014-03-24 LAB — BASIC METABOLIC PANEL
BUN: 23 mg/dL (ref 6–23)
CHLORIDE: 98 meq/L (ref 96–112)
CO2: 30 mEq/L (ref 19–32)
Calcium: 9.4 mg/dL (ref 8.4–10.5)
Creatinine, Ser: 1.1 mg/dL (ref 0.4–1.2)
GFR: 64.79 mL/min (ref >60.00)
Glucose, Bld: 100 mg/dL — ABNORMAL HIGH (ref 70–99)
POTASSIUM: 3.1 meq/L — AB (ref 3.5–5.1)
SODIUM: 138 meq/L (ref 135–145)

## 2014-04-11 ENCOUNTER — Telehealth: Payer: Self-pay

## 2014-04-11 NOTE — Telephone Encounter (Signed)
5/5  Weight= 171 lbs. The pt states that she has mild edema in her left ankle and no sob at this time. 5/6  Weight= 171 lbs. The pt states that the edema in her left ankle is better today and that she has no sob at this time. 5/8  Wright= 171 lbs. The pt states that the edema in her left ankle is better today and that she has no sob at this time. 5/11 Weight= 173 lbs. The pt states that she has mild edema in her left ankle and no sob at this time. The pt took a Zaroxolyn dose today due to weight greater 172 lbs. 5/14 Weight= 170 lbs. The pt states that the edema in her left ankle is better today and that she has no sob at this time. 5/15 Weight= 171 lbs. The pt states that she has mild edema in her left ankle and no sob at this time. 5/18 Weight= 174 lbs.  The pt states that she has "some" swelling from her left toe to her left knee and that she has no sob at this time. The pt took a Zaroxolyn dose today due to weight greater 172 lbs. 5/19 Weight= 171 lbs. The pt states that the edema in her left knee down to toes is better today and that she has no sob at this time.

## 2014-04-21 ENCOUNTER — Telehealth: Payer: Self-pay

## 2014-04-21 ENCOUNTER — Inpatient Hospital Stay (HOSPITAL_COMMUNITY)
Admission: EM | Admit: 2014-04-21 | Discharge: 2014-04-25 | DRG: 640 | Disposition: A | Discharge status: Skilled Nursing Facility | Payer: Medicare Other | Attending: Internal Medicine | Admitting: Internal Medicine

## 2014-04-21 ENCOUNTER — Emergency Department (HOSPITAL_COMMUNITY): Payer: Medicare Other

## 2014-04-21 ENCOUNTER — Encounter (HOSPITAL_COMMUNITY): Payer: Self-pay | Admitting: Emergency Medicine

## 2014-04-21 ENCOUNTER — Telehealth: Payer: Self-pay | Admitting: Cardiology

## 2014-04-21 DIAGNOSIS — R627 Adult failure to thrive: Principal | ICD-10-CM | POA: Diagnosis present

## 2014-04-21 DIAGNOSIS — I2789 Other specified pulmonary heart diseases: Secondary | ICD-10-CM | POA: Diagnosis present

## 2014-04-21 DIAGNOSIS — R609 Edema, unspecified: Secondary | ICD-10-CM

## 2014-04-21 DIAGNOSIS — M6250 Muscle wasting and atrophy, not elsewhere classified, unspecified site: Secondary | ICD-10-CM | POA: Diagnosis not present

## 2014-04-21 DIAGNOSIS — I10 Essential (primary) hypertension: Secondary | ICD-10-CM

## 2014-04-21 DIAGNOSIS — D72829 Elevated white blood cell count, unspecified: Secondary | ICD-10-CM | POA: Diagnosis not present

## 2014-04-21 DIAGNOSIS — R279 Unspecified lack of coordination: Secondary | ICD-10-CM | POA: Diagnosis not present

## 2014-04-21 DIAGNOSIS — E876 Hypokalemia: Secondary | ICD-10-CM | POA: Diagnosis not present

## 2014-04-21 DIAGNOSIS — I509 Heart failure, unspecified: Secondary | ICD-10-CM | POA: Diagnosis not present

## 2014-04-21 DIAGNOSIS — R404 Transient alteration of awareness: Secondary | ICD-10-CM | POA: Diagnosis not present

## 2014-04-21 DIAGNOSIS — E860 Dehydration: Secondary | ICD-10-CM | POA: Diagnosis present

## 2014-04-21 DIAGNOSIS — Z79899 Other long term (current) drug therapy: Secondary | ICD-10-CM

## 2014-04-21 DIAGNOSIS — R5381 Other malaise: Secondary | ICD-10-CM

## 2014-04-21 DIAGNOSIS — M109 Gout, unspecified: Secondary | ICD-10-CM | POA: Diagnosis not present

## 2014-04-21 DIAGNOSIS — I5032 Chronic diastolic (congestive) heart failure: Secondary | ICD-10-CM | POA: Diagnosis not present

## 2014-04-21 DIAGNOSIS — G40909 Epilepsy, unspecified, not intractable, without status epilepticus: Secondary | ICD-10-CM | POA: Diagnosis not present

## 2014-04-21 DIAGNOSIS — M2550 Pain in unspecified joint: Secondary | ICD-10-CM | POA: Diagnosis not present

## 2014-04-21 DIAGNOSIS — R5383 Other fatigue: Secondary | ICD-10-CM

## 2014-04-21 DIAGNOSIS — F411 Generalized anxiety disorder: Secondary | ICD-10-CM | POA: Diagnosis not present

## 2014-04-21 DIAGNOSIS — R262 Difficulty in walking, not elsewhere classified: Secondary | ICD-10-CM | POA: Diagnosis not present

## 2014-04-21 DIAGNOSIS — M47812 Spondylosis without myelopathy or radiculopathy, cervical region: Secondary | ICD-10-CM | POA: Diagnosis not present

## 2014-04-21 DIAGNOSIS — M4802 Spinal stenosis, cervical region: Secondary | ICD-10-CM | POA: Diagnosis not present

## 2014-04-21 DIAGNOSIS — M1990 Unspecified osteoarthritis, unspecified site: Secondary | ICD-10-CM | POA: Diagnosis not present

## 2014-04-21 DIAGNOSIS — Z950 Presence of cardiac pacemaker: Secondary | ICD-10-CM | POA: Diagnosis present

## 2014-04-21 DIAGNOSIS — R531 Weakness: Secondary | ICD-10-CM | POA: Diagnosis present

## 2014-04-21 DIAGNOSIS — R600 Localized edema: Secondary | ICD-10-CM

## 2014-04-21 DIAGNOSIS — J9819 Other pulmonary collapse: Secondary | ICD-10-CM | POA: Diagnosis not present

## 2014-04-21 DIAGNOSIS — G9340 Encephalopathy, unspecified: Secondary | ICD-10-CM | POA: Diagnosis present

## 2014-04-21 DIAGNOSIS — R011 Cardiac murmur, unspecified: Secondary | ICD-10-CM

## 2014-04-21 DIAGNOSIS — M7989 Other specified soft tissue disorders: Secondary | ICD-10-CM | POA: Diagnosis not present

## 2014-04-21 DIAGNOSIS — M6281 Muscle weakness (generalized): Secondary | ICD-10-CM | POA: Diagnosis not present

## 2014-04-21 DIAGNOSIS — M542 Cervicalgia: Secondary | ICD-10-CM | POA: Diagnosis not present

## 2014-04-21 DIAGNOSIS — Z96649 Presence of unspecified artificial hip joint: Secondary | ICD-10-CM | POA: Diagnosis not present

## 2014-04-21 LAB — CBC
HCT: 35 % — ABNORMAL LOW (ref 36.0–46.0)
Hemoglobin: 12.2 g/dL (ref 12.0–15.0)
MCH: 30.1 pg (ref 26.0–34.0)
MCHC: 34.9 g/dL (ref 30.0–36.0)
MCV: 86.4 fL (ref 78.0–100.0)
Platelets: 336 10*3/uL (ref 150–400)
RBC: 4.05 MIL/uL (ref 3.87–5.11)
RDW: 14.6 % (ref 11.5–15.5)
WBC: 9 10*3/uL (ref 4.0–10.5)

## 2014-04-21 LAB — BASIC METABOLIC PANEL
BUN: 28 mg/dL — AB (ref 6–23)
CHLORIDE: 95 meq/L — AB (ref 96–112)
CO2: 26 mEq/L (ref 19–32)
Calcium: 10.4 mg/dL (ref 8.4–10.5)
Creatinine, Ser: 0.96 mg/dL (ref 0.50–1.10)
GFR calc non Af Amer: 54 mL/min — ABNORMAL LOW (ref >90)
GFR, EST AFRICAN AMERICAN: 63 mL/min — AB (ref >90)
Glucose, Bld: 108 mg/dL — ABNORMAL HIGH (ref 70–99)
Potassium: 4 mEq/L (ref 3.7–5.3)
Sodium: 137 mEq/L (ref 137–147)

## 2014-04-21 LAB — URINALYSIS, ROUTINE W REFLEX MICROSCOPIC
Bilirubin Urine: NEGATIVE
GLUCOSE, UA: NEGATIVE mg/dL
Hgb urine dipstick: NEGATIVE
Ketones, ur: NEGATIVE mg/dL
LEUKOCYTES UA: NEGATIVE
Nitrite: NEGATIVE
PROTEIN: NEGATIVE mg/dL
Specific Gravity, Urine: 1.008 (ref 1.005–1.030)
Urobilinogen, UA: 0.2 mg/dL (ref 0.0–1.0)
pH: 7 (ref 5.0–8.0)

## 2014-04-21 LAB — HEPATIC FUNCTION PANEL
ALT: 6 U/L (ref 0–35)
AST: 21 U/L (ref 0–37)
Albumin: 3.3 g/dL — ABNORMAL LOW (ref 3.5–5.2)
Alkaline Phosphatase: 106 U/L (ref 39–117)
BILIRUBIN TOTAL: 0.4 mg/dL (ref 0.3–1.2)
Total Protein: 8.2 g/dL (ref 6.0–8.3)

## 2014-04-21 LAB — I-STAT CHEM 8, ED
BUN: 27 mg/dL — ABNORMAL HIGH (ref 6–23)
Calcium, Ion: 1.22 mmol/L (ref 1.13–1.30)
Chloride: 95 mEq/L — ABNORMAL LOW (ref 96–112)
Creatinine, Ser: 1.1 mg/dL (ref 0.50–1.10)
Glucose, Bld: 112 mg/dL — ABNORMAL HIGH (ref 70–99)
HCT: 39 % (ref 36.0–46.0)
HEMOGLOBIN: 13.3 g/dL (ref 12.0–15.0)
Potassium: 3.8 mEq/L (ref 3.7–5.3)
Sodium: 138 mEq/L (ref 137–147)
TCO2: 28 mmol/L (ref 0–100)

## 2014-04-21 LAB — PRO B NATRIURETIC PEPTIDE: Pro B Natriuretic peptide (BNP): 1164 pg/mL — ABNORMAL HIGH (ref 0–450)

## 2014-04-21 LAB — CBG MONITORING, ED: Glucose-Capillary: 103 mg/dL — ABNORMAL HIGH (ref 70–99)

## 2014-04-21 LAB — LIPASE, BLOOD: Lipase: 15 U/L (ref 11–59)

## 2014-04-21 MED ORDER — MAGNESIUM OXIDE 400 (241.3 MG) MG PO TABS
400.0000 mg | ORAL_TABLET | Freq: Every day | ORAL | Status: DC
  Administered 2014-04-22 – 2014-04-25 (×4): 400 mg via ORAL
  Filled 2014-04-21 (×4): qty 1

## 2014-04-21 MED ORDER — SORBITOL 70 % SOLN: 30.0000 mL | Freq: Every day | Status: DC | PRN

## 2014-04-21 MED ORDER — DIAZEPAM 2 MG PO TABS: 2.0000 mg | ORAL_TABLET | Freq: Two times a day (BID) | ORAL | Status: DC | PRN

## 2014-04-21 MED ORDER — SODIUM CHLORIDE 0.9 % IJ SOLN: 3.0000 mL | INTRAMUSCULAR | Status: DC | PRN

## 2014-04-21 MED ORDER — MORPHINE SULFATE 2 MG/ML IJ SOLN
2.0000 mg | Freq: Once | INTRAMUSCULAR | Status: AC
  Administered 2014-04-21: 2 mg via INTRAVENOUS
  Filled 2014-04-21: qty 1

## 2014-04-21 MED ORDER — ONDANSETRON HCL 4 MG PO TABS: 4.0000 mg | ORAL_TABLET | Freq: Four times a day (QID) | ORAL | Status: DC | PRN

## 2014-04-21 MED ORDER — HEPARIN SODIUM (PORCINE) 5000 UNIT/ML IJ SOLN
5000.0000 [IU] | Freq: Three times a day (TID) | INTRAMUSCULAR | Status: DC
  Administered 2014-04-22 – 2014-04-25 (×10): 5000 [IU] via SUBCUTANEOUS
  Filled 2014-04-21 (×13): qty 1

## 2014-04-21 MED ORDER — TRAMADOL HCL 50 MG PO TABS
50.0000 mg | ORAL_TABLET | ORAL | Status: DC | PRN
  Administered 2014-04-22 – 2014-04-24 (×3): 50 mg via ORAL
  Administered 2014-04-24 – 2014-04-25 (×2): 100 mg via ORAL
  Filled 2014-04-21 (×2): qty 2
  Filled 2014-04-21 (×2): qty 1
  Filled 2014-04-21: qty 2

## 2014-04-21 MED ORDER — HYDROMORPHONE HCL PF 1 MG/ML IJ SOLN
0.5000 mg | INTRAMUSCULAR | Status: DC | PRN
  Administered 2014-04-21: 0.5 mg via INTRAVENOUS
  Filled 2014-04-21: qty 1

## 2014-04-21 MED ORDER — ONDANSETRON HCL 4 MG/2ML IJ SOLN: 4.0000 mg | Freq: Four times a day (QID) | INTRAMUSCULAR | Status: DC | PRN

## 2014-04-21 MED ORDER — ALUM & MAG HYDROXIDE-SIMETH 200-200-20 MG/5ML PO SUSP: 30.0000 mL | Freq: Four times a day (QID) | ORAL | Status: DC | PRN

## 2014-04-21 MED ORDER — SODIUM CHLORIDE 0.9 % IV SOLN: 250.0000 mL | INTRAVENOUS | Status: DC | PRN

## 2014-04-21 MED ORDER — SODIUM CHLORIDE 0.9 % IJ SOLN
3.0000 mL | Freq: Two times a day (BID) | INTRAMUSCULAR | Status: DC
  Administered 2014-04-22 – 2014-04-25 (×7): 3 mL via INTRAVENOUS

## 2014-04-21 NOTE — ED Provider Notes (Signed)
CSN: [id number]     Arrival date & time 04/21/14  1716 History   First MD Initiated Contact with Patient 04/21/14 1732     Chief Complaint  Patient presents with  . Weakness     (Consider location/radiation/quality/duration/timing/severity/associated sxs/prior Treatment) HPI Comments: Patient from home with 3 day history of generalized weakness, increasing edema to her legs, possible confusion, head and neck pain. Patient denies any falls. Son endorses patient has had poor appetite and doesn't want to eat or drink. She is a history of chronic diastolic heart failure and states compliance with her Lasix. She denies any chest pain or shortness of breath. She denies abdominal pain, nausea or vomiting. She endorses pain in both sides of her neck that is worse with palpation and movement. No focal weakness, numbness or tingling. No fever or chills.  The history is provided by the EMS personnel, the patient and a relative.    Past Medical History  Diagnosis Date  . Edema     . EF 65-70%, echo, November 29, 2010  . Pulmonary hypertension      46 mmHg... echo... January, 2012  . Cellulitis     Superficial cellulitis right lower leg   January 14, 2011  . AV block      2:1  January 14, 2011 / pacemaker plan when cellulitis resolved in length  . Bradycardia     2:1  AV block  . Hypokalemia     October, 2012  . Ejection fraction     EF 65%, echo, January, 2012  . Murmur     No significant valvular disease by echo, January, 2012  . Pacemaker     January, 2013  . Chronic diastolic CHF (congestive heart failure)    Past Surgical History  Procedure Laterality Date  . Partial hysterectomy    . Hip arthroplasty      total   Family History  Problem Relation Age of Onset  . Cardiomyopathy Father   . Diabetes Father   . Cancer       son  . Coronary artery disease      son   History  Substance Use Topics  . Smoking status: Never Smoker   . Smokeless tobacco: Not on file  . Alcohol  Use: No   OB History   Grav Para Term Preterm Abortions TAB SAB Ect Mult Living                 Review of Systems  Constitutional: Positive for activity change, appetite change and fatigue. Negative for fever.  HENT: Negative for congestion.   Respiratory: Negative for cough, chest tightness and shortness of breath.   Cardiovascular: Positive for leg swelling. Negative for chest pain.  Gastrointestinal: Negative for nausea, vomiting and abdominal pain.  Genitourinary: Negative for dysuria.  Musculoskeletal: Positive for arthralgias, myalgias and neck pain.  Neurological: Positive for weakness and headaches. Negative for dizziness and numbness.  A complete 10 system review of systems was obtained and all systems are negative except as noted in the HPI and PMH.      Allergies  Aspirin  Home Medications   Prior to Admission medications   Medication Sig Start Date End Date Taking? Authorizing Provider  Cholecalciferol (VITAMIN D3) 1000 UNITS CAPS Take 1 capsule by mouth daily.     Yes Historical Provider, MD  cyclobenzaprine (FLEXERIL) 5 MG tablet Take 5-10 mg by mouth 2 (two) times daily as needed. For muscle spasm.   Yes Historical Provider,  MD  diazepam (VALIUM) 5 MG tablet Take 5 mg by mouth 2 (two) times daily as needed. For anxiety.   Yes Historical Provider, MD  furosemide (LASIX) 80 MG tablet TAKE ONE TABLET BY MOUTH TWICE DAILY 01/09/14  Yes Luis Abed, MD  magnesium oxide (MAG-OX) 400 MG tablet Take 1 tablet (400 mg total) by mouth daily. 03/10/14  Yes Luis Abed, MD  meloxicam (MOBIC) 15 MG tablet Take 15 mg by mouth daily.    Yes Historical Provider, MD  metolazone (ZAROXOLYN) 2.5 MG tablet Take 2.5 mg by mouth daily as needed. Take if fluid is too high per patient 12/18/13  Yes Luis Abed, MD  Multiple Vitamins-Minerals (MULTIVITAMINS THER. W/MINERALS) TABS Take 1 tablet by mouth daily.     Yes Historical Provider, MD  potassium chloride SA (K-DUR,KLOR-CON) 20  MEQ tablet TAKE 3 TABLETS IN THE AM, 3 TABLETS AT NOON, AND 2 AT BEDTIME. TAKE 3 TABS 3 TIMES DAILY WHEN TAKING ZAROXOLYN 12/26/13  Yes Luis Abed, MD  traMADol (ULTRAM) 50 MG tablet Take 50-100 mg by mouth every 4 (four) hours as needed for moderate pain.    Yes Historical Provider, MD   BP 148/79  Pulse 96  Temp(Src) 98.3 F (36.8 C) (Oral)  Resp 18  Wt 178 lb (80.74 kg)  SpO2 98% Physical Exam  Nursing note and vitals reviewed. Constitutional: She is oriented to person, place, and time. She appears well-developed and well-nourished. No distress.  HENT:  Head: Normocephalic and atraumatic.  Mouth/Throat: Oropharynx is clear and moist.  Eyes: Conjunctivae and EOM are normal. Pupils are equal, round, and reactive to light.  Neck: Normal range of motion. Neck supple.  Paraspinal tenderness bilaterally. No midline tenderness  Cardiovascular: Normal rate, regular rhythm and normal heart sounds.   No murmur heard. Pulmonary/Chest: Effort normal and breath sounds normal. No respiratory distress.  Abdominal: Soft. There is no tenderness. There is no rebound and no guarding.  Musculoskeletal: Normal range of motion. She exhibits edema. She exhibits no tenderness.  +3 pitting edema to knees bilaterally. Intact distal pulses. Left leg appears shorter than right which patient states is chronic. Right hip has been replaced. Full range of motion of bilateral hips without pain  Neurological: She is alert and oriented to person, place, and time. No cranial nerve deficit. She exhibits normal muscle tone. Coordination normal.  Skin: Skin is warm. No rash noted.    ED Course  Procedures (including critical care time) Labs Review Labs Reviewed  CBC - Abnormal; Notable for the following:    HCT 35.0 (*)    All other components within normal limits  BASIC METABOLIC PANEL - Abnormal; Notable for the following:    Chloride 95 (*)    Glucose, Bld 108 (*)    BUN 28 (*)    GFR calc non Af Amer 54  (*)    GFR calc Af Amer 63 (*)    All other components within normal limits  PRO B NATRIURETIC PEPTIDE - Abnormal; Notable for the following:    Pro B Natriuretic peptide (BNP) 1164.0 (*)    All other components within normal limits  HEPATIC FUNCTION PANEL - Abnormal; Notable for the following:    Albumin 3.3 (*)    All other components within normal limits  CBG MONITORING, ED - Abnormal; Notable for the following:    Glucose-Capillary 103 (*)    All other components within normal limits  I-STAT CHEM 8, ED - Abnormal; Notable  for the following:    Chloride 95 (*)    BUN 27 (*)    Glucose, Bld 112 (*)    All other components within normal limits  URINALYSIS, ROUTINE W REFLEX MICROSCOPIC  LIPASE, BLOOD  CBC  CREATININE, SERUM  BASIC METABOLIC PANEL  CBC    Imaging Review Dg Chest 2 View  04/21/2014   CLINICAL DATA:  Shortness of breath  EXAM: CHEST  2 VIEW  COMPARISON:  12/10/2011  FINDINGS: Stable cardiomegaly and aortic tortuosity. Dual-chamber pacer from the left has unchanged lead orientation given differences in projection.  Chronic elevation of the right diaphragm, likely related to eventration. Minimal atelectasis at the bases. No edema, pneumonia, significant effusion, or pneumothorax. Severe bilateral glenohumeral osteoarthritis.  IMPRESSION: Mild atelectasis at the bases.  No evidence of pneumonia or edema.   Electronically Signed   By: Tiburcio Pea M.D.   On: 04/21/2014 21:17   Ct Head Wo Contrast  04/21/2014   CLINICAL DATA:  Weakness  EXAM: CT HEAD WITHOUT CONTRAST  CT CERVICAL SPINE WITHOUT CONTRAST  TECHNIQUE: Multidetector CT imaging of the head and cervical spine was performed following the standard protocol without intravenous contrast. Multiplanar CT image reconstructions of the cervical spine were also generated.  COMPARISON:  None.  FINDINGS: CT HEAD FINDINGS  Negative for acute intracranial hemorrhage, acute infarction, mass, mass effect, hydrocephalus or midline  shift. Gray-white differentiation is preserved throughout. No focal soft tissue or calvarial abnormality. Bilateral globes and orbits are symmetric and unremarkable. Normal aeration of the mastoid air cells and visualized paranasal sinuses. Trace atherosclerotic calcification in the bilateral cavernous carotid arteries.  CT CERVICAL SPINE FINDINGS  No acute fracture, malalignment or prevertebral soft tissue swelling. Severe multilevel cervical spondylosis with near-total loss of disc space height at C3-C4, C4-C5, C5-C6 and C6-C7. Posterior disc osteophyte complexes result in moderate to severe narrowing at C5-C6. The there is some minimal (1- 2 mm) anterolisthesis of C2 on C3 which is likely degenerative in etiology. Mild reversal the normal cervical lordosis. Bulky degenerative changes present at the atlantodental interval. Partial ossification of the main the ligamentum flavum. Multilevel facet arthropathy most severe at C2-C3. Unremarkable CT appearance of the thyroid gland. No acute soft tissue abnormality. The lung apices are unremarkable. A 9 mm hypoattenuating nodule in the right thyroid gland is nonspecific by CT. No further followup is warranted for this sub cm nodule.  IMPRESSION: CT HEAD  1. No acute intracranial abnormality. 2. Mild intracranial atherosclerotic calcifications. CT CSPINE  1. No acute fracture or malalignment. 2. Severe multilevel cervical spondylosis with moderate to advanced central stenosis at C5-C6, multilevel facet arthropathy most severe at C2-C3 and degenerative reversal of the normal cervical lordosis. 3. Partial ossification of the ligamentum flavum. 4. Bulky degenerative changes at the atlantodental interval. Differential considerations include advanced osteoarthritis, rheumatoid arthritis and calcium pyrophosphate deposition disease (pseudogout).   Electronically Signed   By: Malachy Moan M.D.   On: 04/21/2014 20:13   Ct Cervical Spine Wo Contrast  04/21/2014   CLINICAL  DATA:  Weakness  EXAM: CT HEAD WITHOUT CONTRAST  CT CERVICAL SPINE WITHOUT CONTRAST  TECHNIQUE: Multidetector CT imaging of the head and cervical spine was performed following the standard protocol without intravenous contrast. Multiplanar CT image reconstructions of the cervical spine were also generated.  COMPARISON:  None.  FINDINGS: CT HEAD FINDINGS  Negative for acute intracranial hemorrhage, acute infarction, mass, mass effect, hydrocephalus or midline shift. Gray-white differentiation is preserved throughout. No focal soft tissue  or calvarial abnormality. Bilateral globes and orbits are symmetric and unremarkable. Normal aeration of the mastoid air cells and visualized paranasal sinuses. Trace atherosclerotic calcification in the bilateral cavernous carotid arteries.  CT CERVICAL SPINE FINDINGS  No acute fracture, malalignment or prevertebral soft tissue swelling. Severe multilevel cervical spondylosis with near-total loss of disc space height at C3-C4, C4-C5, C5-C6 and C6-C7. Posterior disc osteophyte complexes result in moderate to severe narrowing at C5-C6. The there is some minimal (1- 2 mm) anterolisthesis of C2 on C3 which is likely degenerative in etiology. Mild reversal the normal cervical lordosis. Bulky degenerative changes present at the atlantodental interval. Partial ossification of the main the ligamentum flavum. Multilevel facet arthropathy most severe at C2-C3. Unremarkable CT appearance of the thyroid gland. No acute soft tissue abnormality. The lung apices are unremarkable. A 9 mm hypoattenuating nodule in the right thyroid gland is nonspecific by CT. No further followup is warranted for this sub cm nodule.  IMPRESSION: CT HEAD  1. No acute intracranial abnormality. 2. Mild intracranial atherosclerotic calcifications. CT CSPINE  1. No acute fracture or malalignment. 2. Severe multilevel cervical spondylosis with moderate to advanced central stenosis at C5-C6, multilevel facet arthropathy  most severe at C2-C3 and degenerative reversal of the normal cervical lordosis. 3. Partial ossification of the ligamentum flavum. 4. Bulky degenerative changes at the atlantodental interval. Differential considerations include advanced osteoarthritis, rheumatoid arthritis and calcium pyrophosphate deposition disease (pseudogout).   Electronically Signed   By: Malachy Moan M.D.   On: 04/21/2014 20:13     EKG Interpretation   Date/Time:  Monday April 21 2014 17:39:50 EDT Ventricular Rate:  108 PR Interval:  153 QRS Duration: 149 QT Interval:  370 QTC Calculation: 496 R Axis:   -83 Text Interpretation:  Sinus tachycardia Probable left atrial enlargement  Nonspecific IVCD with LAD s Baseline wander in lead(s) V6 VENTRICULAR  PACED RHYTHM Confirmed by Manus Gunning  MD, Frederika Hukill [phone]) on 04/21/2014 5:53:19  PM      MDM   Final diagnoses:  Weakness  Neck pain  Leg edema   Patient home with generalized weakness, fatigue and increased peripheral edema over the past several days. No chest pain or shortness of breath.  Labs appear to be at baseline except for BNP which is 1100. Chest x-ray shows no edema. Patient has no hypoxia. CT head is negative. CT C-spine shows severe cervical spondylosis with severe facet arthropathy. This is likely the source of her neck pain. Equal grip strengths bilaterally.  No chest pain or shortness of breath.  Dopplers are negative for DVT. Patient does not have evidence of CHF exacerbation. Chest x-ray is clear. Patient is not requiring any oxygen. She may fact be a little volume depleted.  Family reports significant functional decline in the past several weeks and she is unable to ambulate. Admission discussed with Dr. Allena Katz.  Glynn Octave, MD 04/22/14 [phone]

## 2014-04-21 NOTE — ED Notes (Signed)
CT called and notified that pt was finished with doppler study

## 2014-04-21 NOTE — Telephone Encounter (Signed)
CHF Call: I called the pt to get her weight for today and noticed that she seemed confused and could not focus on our conversation. I asked to speak to her aid, [name], who told me that the pt was falling asleep while talking with me. She states that they do not know what she weighs because the pt is to weak to stand up and get on scales. [name] also states that the pt c/o edema in both legs from groin to toes, pain in her side, knees and neck, that she has very little appetite and extreme weakness and fatigue. [name] states that the pts daughter, [name], is aware and attempted all weekend to take the pt to ER or Urgent care but the pt refused.   I called [name] who confirmed that the pt has been having these issues since Friday but will not allow them to take her to the ER.   I talked with Dr Myrtis Ser concerning the pts s/s and he recommended that the family take her to the ER for evaluation.   [name] is advised and she states that she is on her way to get her mother and take her to the ER at this time.

## 2014-04-21 NOTE — Progress Notes (Signed)
VASCULAR LAB PRELIMINARY  PRELIMINARY  PRELIMINARY  PRELIMINARY  Bilateral lower extremity venous duplex  completed.    Preliminary report:  Bilateral:  No evidence of DVT, superficial thrombosis, or Baker's Cyst.    Gara Kroner, RVT 04/21/2014, 7:12 PM

## 2014-04-21 NOTE — ED Notes (Signed)
Per EMS:Pt from home, states she has been feeling more weak over the past few days. Pt was told by PCP that her potassium may be low and to call EMS. EMS notes +5 pitting edema to bilateral legs, pt denies any cp, sob, cbg upon arrival was 71. EMS also notes possible UTI. Pt family states pt is at baseline, axo x4. nad noted.

## 2014-04-21 NOTE — Telephone Encounter (Signed)
New message     Daughter spoke with nurse earlier and nurse told her to take her mother to the hosp.  She has a question for Dr Henrietta Hoover nurse.

## 2014-04-21 NOTE — ED Notes (Signed)
[name] [phone]  [phone]

## 2014-04-21 NOTE — Telephone Encounter (Signed)
The pts daughter states that she decided to call 911 to take her mother to the ER as her mother refused to go to the ER all weekend and [place] did not think she would allow her to take her today. She states that she told her that Dr Myrtis Ser and Larita Fife wants her to go so she agreed to get in ambulance. [name] is concerned that the pt may not be taking her meds as directed because she will not let [name] put them in a pill box to make sure that she is taking correctly and states "I dont have to take the same pills everyday". Because [name] does not live near her mother she wonders if the pt is honest about the edema in her legs when I call her daily. [name] is requesting that Dr Myrtis Ser see her mother if she is hospitalized. I will forward note to Dr Myrtis Ser as an Lorain Childes.

## 2014-04-21 NOTE — ED Notes (Addendum)
Attempted report x1. 

## 2014-04-21 NOTE — H&P (Signed)
[facility] History and Physical  [name] ZOX:[id number] DOB: 1931/07/11 DOA: 04/21/2014  Referring physician:  PCP: Alice ReichertMCINNIS,ANGUS G, MD   Chief Complaint: Failure to thrive/weakness  HPI: [name] is a 80 y.o. female with a past medical history of chronic diastolic congestive heart failure, last transthoracic echocardiogram performed in 2012 which showed preserved ejection fraction, chronic lower tremor the edema, status post pacemaker implant in 2013, resecting to the emergency department with complaints of functional decline/failure to thrive. Family members report that patient has become progressively weak over the past 2 weeks. At baseline she is able to ambulate with walker however family members reporting that lately she has required significant assistance is getting her out of bed. Family members also reporting increased confusion, disoriented, "not acting herself". Over the last 24-48 hours she has been unable to ambulate. She denies fevers, chills, nausea, vomiting, diarrhea, constipation, dysuria. Workup in the emergency room included a urinalysis which was negative, chest x-ray which does not reveal evidence of pneumonia or acute CHF. She is currently on Lasix 80 mg PO BID and Zaroxolyn 2.5 mg PO daily.                                                                                                                                                                                                                                     Review of Systems:  Constitutional:  No weight loss, night sweats, Fevers, chills, Positive for fatigue, generalized weakness.  HEENT:  No headaches, Difficulty swallowing,Tooth/dental problems,Sore throat,  No sneezing, itching, ear ache, nasal congestion, post nasal drip,  Cardio-vascular:  No chest pain, Orthopnea, PND, swelling in lower extremities, anasarca, dizziness, palpitations  GI:  No heartburn, indigestion, abdominal pain,  nausea, vomiting, diarrhea, change in bowel habits, loss of appetite  Resp:  No shortness of breath with exertion or at rest. No excess mucus, no productive cough, No non-productive cough, No coughing up of blood.No change in color of mucus.No wheezing.No chest wall deformity  Skin:  no rash or lesions.  GU:  no dysuria, change in color of urine, no urgency or frequency. No flank pain.  Musculoskeletal:  No joint pain or swelling. No decreased range of motion. No back pain.  Psych:  No change in mood or affect. No depression or anxiety. No memory loss.   Past Medical History  Diagnosis Date  . Edema     . EF  65-70%, echo, November 29, 2010  . Pulmonary hypertension      46 mmHg... echo... January, 2012  . Cellulitis     Superficial cellulitis right lower leg   January 14, 2011  . AV block      2:1  January 14, 2011 / pacemaker plan when cellulitis resolved in length  . Bradycardia     2:1  AV block  . Hypokalemia     October, 2012  . Ejection fraction     EF 65%, echo, January, 2012  . Murmur     No significant valvular disease by echo, January, 2012  . Pacemaker     January, 2013  . Chronic diastolic CHF (congestive heart failure)    Past Surgical History  Procedure Laterality Date  . Partial hysterectomy    . Hip arthroplasty      total   Social History:  reports that she has never smoked. She does not have any smokeless tobacco history on file. She reports that she does not drink alcohol or use illicit drugs.  Allergies  Allergen Reactions  . Aspirin     REACTION: rash    Family History  Problem Relation Age of Onset  . Cardiomyopathy Father   . Diabetes Father   . Cancer       son  . Coronary artery disease      son     Prior to Admission medications   Medication Sig Start Date End Date Taking? Authorizing Provider  Cholecalciferol (VITAMIN D3) 1000 UNITS CAPS Take 1 capsule by mouth daily.     Yes Historical Provider, MD  cyclobenzaprine (FLEXERIL) 5  MG tablet Take 5-10 mg by mouth 2 (two) times daily as needed. For muscle spasm.   Yes Historical Provider, MD  diazepam (VALIUM) 5 MG tablet Take 5 mg by mouth 2 (two) times daily as needed. For anxiety.   Yes Historical Provider, MD  furosemide (LASIX) 80 MG tablet TAKE ONE TABLET BY MOUTH TWICE DAILY 01/09/14  Yes Luis Abed, MD  magnesium oxide (MAG-OX) 400 MG tablet Take 1 tablet (400 mg total) by mouth daily. 03/10/14  Yes Luis Abed, MD  meloxicam (MOBIC) 15 MG tablet Take 15 mg by mouth daily.    Yes Historical Provider, MD  metolazone (ZAROXOLYN) 2.5 MG tablet Take 2.5 mg by mouth daily as needed. Take if fluid is too high per patient 12/18/13  Yes Luis Abed, MD  Multiple Vitamins-Minerals (MULTIVITAMINS THER. W/MINERALS) TABS Take 1 tablet by mouth daily.     Yes Historical Provider, MD  potassium chloride SA (K-DUR,KLOR-CON) 20 MEQ tablet TAKE 3 TABLETS IN THE AM, 3 TABLETS AT NOON, AND 2 AT BEDTIME. TAKE 3 TABS 3 TIMES DAILY WHEN TAKING ZAROXOLYN 12/26/13  Yes Luis Abed, MD  traMADol (ULTRAM) 50 MG tablet Take 50-100 mg by mouth every 4 (four) hours as needed for moderate pain.    Yes Historical Provider, MD   Physical Exam: Filed Vitals:   04/21/14 2209  BP: 120/51  Pulse: 102  Temp:   Resp: 18    BP 120/51  Pulse 102  Temp(Src) 98.5 F (36.9 C) (Oral)  Resp 18  SpO2 98%  General:  Appears calm and comfortable, no acute distress Eyes: PERRL, normal lids, irises & conjunctiva ENT: grossly normal hearing, lips & tongue Neck: no LAD, masses or thyromegaly Cardiovascular: RRR, no m/r/g. Left greater than right lower extremity edema Telemetry: SR, no arrhythmias  Respiratory: CTA bilaterally, no  w/r/r. Normal respiratory effort. Abdomen: soft, ntnd Skin: no rash or induration seen on limited exam Musculoskeletal: grossly normal tone BUE/BLE Psychiatric: grossly normal mood and affect, speech fluent and appropriate Neurologic: grossly non-focal.           Labs on Admission:  Basic Metabolic Panel:  Recent Labs Lab 04/21/14 1730 04/21/14 1809  NA 137 138  K 4.0 3.8  CL 95* 95*  CO2 26  --   GLUCOSE 108* 112*  BUN 28* 27*  CREATININE 0.96 1.10  CALCIUM 10.4  --    Liver Function Tests:  Recent Labs Lab 04/21/14 1742  AST 21  ALT 6  ALKPHOS 106  BILITOT 0.4  PROT 8.2  ALBUMIN 3.3*    Recent Labs Lab 04/21/14 1742  LIPASE 15   No results found for this basename: AMMONIA,  in the last 168 hours CBC:  Recent Labs Lab 04/21/14 1730 04/21/14 1809  WBC 9.0  --   HGB 12.2 13.3  HCT 35.0* 39.0  MCV 86.4  --   PLT 336  --    Cardiac Enzymes: No results found for this basename: CKTOTAL, CKMB, CKMBINDEX, TROPONINI,  in the last 168 hours  BNP (last 3 results)  Recent Labs  04/21/14 1742  PROBNP 1164.0*   CBG:  Recent Labs Lab 04/21/14 1752  GLUCAP 103*    Radiological Exams on Admission: Dg Chest 2 View  04/21/2014   CLINICAL DATA:  Shortness of breath  EXAM: CHEST  2 VIEW  COMPARISON:  12/10/2011  FINDINGS: Stable cardiomegaly and aortic tortuosity. Dual-chamber pacer from the left has unchanged lead orientation given differences in projection.  Chronic elevation of the right diaphragm, likely related to eventration. Minimal atelectasis at the bases. No edema, pneumonia, significant effusion, or pneumothorax. Severe bilateral glenohumeral osteoarthritis.  IMPRESSION: Mild atelectasis at the bases.  No evidence of pneumonia or edema.   Electronically Signed   By: Tiburcio Pea M.D.   On: 04/21/2014 21:17   Ct Head Wo Contrast  04/21/2014   CLINICAL DATA:  Weakness  EXAM: CT HEAD WITHOUT CONTRAST  CT CERVICAL SPINE WITHOUT CONTRAST  TECHNIQUE: Multidetector CT imaging of the head and cervical spine was performed following the standard protocol without intravenous contrast. Multiplanar CT image reconstructions of the cervical spine were also generated.  COMPARISON:  None.  FINDINGS: CT HEAD FINDINGS  Negative  for acute intracranial hemorrhage, acute infarction, mass, mass effect, hydrocephalus or midline shift. Gray-white differentiation is preserved throughout. No focal soft tissue or calvarial abnormality. Bilateral globes and orbits are symmetric and unremarkable. Normal aeration of the mastoid air cells and visualized paranasal sinuses. Trace atherosclerotic calcification in the bilateral cavernous carotid arteries.  CT CERVICAL SPINE FINDINGS  No acute fracture, malalignment or prevertebral soft tissue swelling. Severe multilevel cervical spondylosis with near-total loss of disc space height at C3-C4, C4-C5, C5-C6 and C6-C7. Posterior disc osteophyte complexes result in moderate to severe narrowing at C5-C6. The there is some minimal (1- 2 mm) anterolisthesis of C2 on C3 which is likely degenerative in etiology. Mild reversal the normal cervical lordosis. Bulky degenerative changes present at the atlantodental interval. Partial ossification of the main the ligamentum flavum. Multilevel facet arthropathy most severe at C2-C3. Unremarkable CT appearance of the thyroid gland. No acute soft tissue abnormality. The lung apices are unremarkable. A 9 mm hypoattenuating nodule in the right thyroid gland is nonspecific by CT. No further followup is warranted for this sub cm nodule.  IMPRESSION: CT HEAD  1.  No acute intracranial abnormality. 2. Mild intracranial atherosclerotic calcifications. CT CSPINE  1. No acute fracture or malalignment. 2. Severe multilevel cervical spondylosis with moderate to advanced central stenosis at C5-C6, multilevel facet arthropathy most severe at C2-C3 and degenerative reversal of the normal cervical lordosis. 3. Partial ossification of the ligamentum flavum. 4. Bulky degenerative changes at the atlantodental interval. Differential considerations include advanced osteoarthritis, rheumatoid arthritis and calcium pyrophosphate deposition disease (pseudogout).   Electronically Signed   By: Malachy Moan M.D.   On: 04/21/2014 20:13   Ct Cervical Spine Wo Contrast  04/21/2014   CLINICAL DATA:  Weakness  EXAM: CT HEAD WITHOUT CONTRAST  CT CERVICAL SPINE WITHOUT CONTRAST  TECHNIQUE: Multidetector CT imaging of the head and cervical spine was performed following the standard protocol without intravenous contrast. Multiplanar CT image reconstructions of the cervical spine were also generated.  COMPARISON:  None.  FINDINGS: CT HEAD FINDINGS  Negative for acute intracranial hemorrhage, acute infarction, mass, mass effect, hydrocephalus or midline shift. Gray-white differentiation is preserved throughout. No focal soft tissue or calvarial abnormality. Bilateral globes and orbits are symmetric and unremarkable. Normal aeration of the mastoid air cells and visualized paranasal sinuses. Trace atherosclerotic calcification in the bilateral cavernous carotid arteries.  CT CERVICAL SPINE FINDINGS  No acute fracture, malalignment or prevertebral soft tissue swelling. Severe multilevel cervical spondylosis with near-total loss of disc space height at C3-C4, C4-C5, C5-C6 and C6-C7. Posterior disc osteophyte complexes result in moderate to severe narrowing at C5-C6. The there is some minimal (1- 2 mm) anterolisthesis of C2 on C3 which is likely degenerative in etiology. Mild reversal the normal cervical lordosis. Bulky degenerative changes present at the atlantodental interval. Partial ossification of the main the ligamentum flavum. Multilevel facet arthropathy most severe at C2-C3. Unremarkable CT appearance of the thyroid gland. No acute soft tissue abnormality. The lung apices are unremarkable. A 9 mm hypoattenuating nodule in the right thyroid gland is nonspecific by CT. No further followup is warranted for this sub cm nodule.  IMPRESSION: CT HEAD  1. No acute intracranial abnormality. 2. Mild intracranial atherosclerotic calcifications. CT CSPINE  1. No acute fracture or malalignment. 2. Severe multilevel cervical  spondylosis with moderate to advanced central stenosis at C5-C6, multilevel facet arthropathy most severe at C2-C3 and degenerative reversal of the normal cervical lordosis. 3. Partial ossification of the ligamentum flavum. 4. Bulky degenerative changes at the atlantodental interval. Differential considerations include advanced osteoarthritis, rheumatoid arthritis and calcium pyrophosphate deposition disease (pseudogout).   Electronically Signed   By: Malachy Moan M.D.   On: 04/21/2014 20:13    EKG: Independently reviewed.  Assessment/Plan Principal Problem:   FTT (failure to thrive) in adult Active Problems:   Chronic diastolic CHF (congestive heart failure)   Weakness   Edema   Hypertension   Pacemaker   1. Failure to thrive/functional decline. Patient presented with overall functional decline over the past several weeks, becoming progressively weaker, unable to ambulate in the last 24-48 hours. Initial lab work performed in the emergency room did not reveal obvious infection, she presents afebrile, hemodynamically stable. I suspect that dehydration may be contributing as she is currently on 80 mg of Lasix twice a day along with Zaroxolyn 2.5 mg daily. Will hold diuretic therapy this evening, reassess volume status in a.m. Physical therapy consultation, supportive care. 2. Chronic diastolic congestive heart failure. Status post transthoracic echocardiogram performed on 11/29/2010 which showed preserved ejection fraction. Given the failure to thrive and my suspicion for dehydration, will  hold diuretic therapy this evening. She does not present with symptoms suggestive of acute decompensated heart failure. Chest x-ray did not show acute CHF. Follow up volume status.  3. Hypertension. Blood pressures in the emergency room stable, holding this evening's dose of diuretic therapy, will monitor blood pressures overnight.  4. Neck pain. Patient presenting with complaints of neck pain, CT scan of  the cervical spine showing severe multilevel cervical spondylosis. Will continue PRN tramadol. Hold off on IV narcotics if possible given patient's increased confusion/functional decline.  5. DVT prophylaxis. Subcutaneous heparin  Code Status: Full Code Family Communication: Spoke to family members present at bedside Disposition Plan: Anticapate she may require greater than 2 nights hospitalizaton  Time spent: 70 min  Jeralyn Bennett [facility] Pager [phone]  **Disclaimer: This note may have been dictated with voice recognition software. Similar sounding words can inadvertently be transcribed and this note may contain transcription errors which may not have been corrected upon publication of note.**

## 2014-04-22 DIAGNOSIS — M542 Cervicalgia: Secondary | ICD-10-CM

## 2014-04-22 DIAGNOSIS — I509 Heart failure, unspecified: Secondary | ICD-10-CM | POA: Diagnosis not present

## 2014-04-22 DIAGNOSIS — R609 Edema, unspecified: Secondary | ICD-10-CM | POA: Diagnosis not present

## 2014-04-22 DIAGNOSIS — I5032 Chronic diastolic (congestive) heart failure: Secondary | ICD-10-CM | POA: Diagnosis not present

## 2014-04-22 LAB — CBC
HCT: 33.3 % — ABNORMAL LOW (ref 36.0–46.0)
Hemoglobin: 11.1 g/dL — ABNORMAL LOW (ref 12.0–15.0)
MCH: 29.4 pg (ref 26.0–34.0)
MCHC: 34.1 g/dL (ref 30.0–36.0)
MCV: 86.4 fL (ref 78.0–100.0)
PLATELETS: 325 10*3/uL (ref 150–400)
RBC: 3.67 MIL/uL — ABNORMAL LOW (ref 3.87–5.11)
RDW: 14.5 % (ref 11.5–15.5)
WBC: 8.8 10*3/uL (ref 4.0–10.5)

## 2014-04-22 LAB — BASIC METABOLIC PANEL
BUN: 23 mg/dL (ref 6–23)
CALCIUM: 9.6 mg/dL (ref 8.4–10.5)
CO2: 27 mEq/L (ref 19–32)
Chloride: 95 mEq/L — ABNORMAL LOW (ref 96–112)
Creatinine, Ser: 0.86 mg/dL (ref 0.50–1.10)
GFR calc non Af Amer: 62 mL/min — ABNORMAL LOW (ref >90)
GFR, EST AFRICAN AMERICAN: 72 mL/min — AB (ref >90)
Glucose, Bld: 107 mg/dL — ABNORMAL HIGH (ref 70–99)
Potassium: 3.5 mEq/L — ABNORMAL LOW (ref 3.7–5.3)
SODIUM: 137 meq/L (ref 137–147)

## 2014-04-22 MED ORDER — PREDNISONE 20 MG PO TABS
40.0000 mg | ORAL_TABLET | Freq: Every day | ORAL | Status: DC
  Administered 2014-04-23 – 2014-04-25 (×3): 40 mg via ORAL
  Filled 2014-04-22 (×4): qty 2

## 2014-04-22 MED ORDER — METHYLPREDNISOLONE SODIUM SUCC 125 MG IJ SOLR
60.0000 mg | Freq: Once | INTRAMUSCULAR | Status: AC
  Administered 2014-04-22: 60 mg via INTRAVENOUS
  Filled 2014-04-22: qty 0.96

## 2014-04-22 MED ORDER — POTASSIUM CHLORIDE CRYS ER 20 MEQ PO TBCR
20.0000 meq | EXTENDED_RELEASE_TABLET | Freq: Two times a day (BID) | ORAL | Status: DC
  Administered 2014-04-22 – 2014-04-25 (×6): 20 meq via ORAL
  Filled 2014-04-22 (×8): qty 1

## 2014-04-22 MED ORDER — FUROSEMIDE 80 MG PO TABS
80.0000 mg | ORAL_TABLET | Freq: Two times a day (BID) | ORAL | Status: DC
  Administered 2014-04-22 – 2014-04-25 (×7): 80 mg via ORAL
  Filled 2014-04-22 (×8): qty 1

## 2014-04-22 MED ORDER — DIPHENHYDRAMINE-ZINC ACETATE 2-0.1 % EX CREA
TOPICAL_CREAM | Freq: Two times a day (BID) | CUTANEOUS | Status: DC | PRN
  Administered 2014-04-22: 1 via TOPICAL
  Filled 2014-04-22 (×2): qty 28

## 2014-04-22 NOTE — Evaluation (Signed)
Physical Therapy Evaluation Patient Details Name: [name] AshingMildred S Economos MRN: [id number] DOB: 02/03/1931 Today's Date: 04/22/2014   History of Present Illness  HPI: [name] AshingMildred S Streater is a 80 y.o. female with a past medical history of chronic diastolic congestive heart failure, last transthoracic echocardiogram performed in 2012 which showed preserved ejection fraction, chronic lower tremor the edema, status post pacemaker implant in 2013, resecting to the emergency department with complaints of functional decline/failure to thrive. Family members report that patient has become progressively weak over the past 2 weeks. At baseline she is able to ambulate with walker however family members reporting that lately she has required significant assistance is getting her out of bed. Family members also reporting increased confusion, disoriented, "not acting herself". Over the last 24-48 hours she has been unable to ambulate. She denies fevers, chills, nausea, vomiting, diarrhea, constipation, dysuria. Workup in the emergency room included a urinalysis which was negative, chest x-ray which does not reveal evidence of pneumonia or acute CHF.  Clinical Impression   Pt admitted with above. Pt currently with functional limitations due to the deficits listed below (see PT Problem List).   Pt will benefit from skilled PT to increase their independence and safety with mobility to allow discharge to the venue listed below.        Follow Up Recommendations SNF;Supervision/Assistance - 24 hour    Equipment Recommendations  3in1 (PT) (short)    Recommendations for Other Services       Precautions / Restrictions Precautions Precautions: Fall      Mobility  Bed Mobility Overal bed mobility: Needs Assistance Bed Mobility: Rolling;Supine to Sit Rolling: Mod assist   Supine to sit: Mod assist     General bed mobility comments: Heavy mod assist and use of rails to roll; Heavy mod assist to elevate trunk and  acheive fully upright sitting  Transfers Overall transfer level: Needs assistance Equipment used: None Transfers: Lateral/Scoot Transfers          Lateral/Scoot Transfers: Mod assist General transfer comment: Mod assist to lateral scoot OOB to drop-arm recliner on pt's Right; step-by-step cues, and very close guard for safety; Very slow moving, but pt showing good understanding of transfer, and was able to adequately weight shift forward and unweigh hips to scoot -- short, inefficient scoots; took quite a while to get to recliner  Ambulation/Gait             General Gait Details: Unable today  Stairs            Wheelchair Mobility    Modified Rankin (Stroke Patients Only)       Balance Overall balance assessment: Needs assistance Sitting-balance support: Single extremity supported;Bilateral upper extremity supported;Feet supported Sitting balance-Leahy Scale: Fair                                       Pertinent Vitals/Pain no apparent distress Elevated bil LEs    Home Living Family/patient expects to be discharged to:: Private residence Living Arrangements: Alone Available Help at Discharge: Family (Family is looking into other options than pt living alone) Type of Home: House Home Access: Stairs to enter   Entergy CorporationEntrance Stairs-Number of Steps: 1 Home Layout: One level Home Equipment: Environmental consultantWalker - 2 wheels;Wheelchair - manual      Prior Function Level of Independence: Independent with assistive device(s)         Comments: Report she was  independent up unitl recent weakness which has lead to this admission     Hand Dominance        Extremity/Trunk Assessment   Upper Extremity Assessment: Generalized weakness           Lower Extremity Assessment: Generalized weakness (bilateral ankle and foot edema; L worse than R)         Communication   Communication: No difficulties  Cognition Arousal/Alertness: Awake/alert Behavior  During Therapy: WFL for tasks assessed/performed Overall Cognitive Status: Within Functional Limits for tasks assessed                      General Comments      Exercises        Assessment/Plan    PT Assessment Patient needs continued PT services  PT Diagnosis Difficulty walking;Generalized weakness;Acute pain   PT Problem List Decreased strength;Decreased range of motion;Decreased activity tolerance;Decreased balance;Decreased mobility;Decreased coordination;Decreased knowledge of use of DME;Pain;Obesity  PT Treatment Interventions DME instruction;Gait training;Functional mobility training;Therapeutic activities;Therapeutic exercise;Balance training;Patient/family education   PT Goals (Current goals can be found in the Care Plan section) Acute Rehab PT Goals Patient Stated Goal: Did not state, but agreeable to getting OOB PT Goal Formulation: With patient Time For Goal Achievement: 05/06/14 Potential to Achieve Goals: Good    Frequency Min 3X/week   Barriers to discharge Decreased caregiver support Pt's son states he will be looking into getting pt more help at home    Co-evaluation               End of Session Equipment Utilized During Treatment: Gait belt Activity Tolerance: Patient tolerated treatment well Patient left: in chair;with call bell/phone within reach;with chair alarm set Nurse Communication: Mobility status         Time: 1201-1250 PT Time Calculation (min): 49 min   Charges:   PT Evaluation $Initial PT Evaluation Tier I: 1 Procedure PT Treatments $Therapeutic Activity: 38-52 mins   PT G Codes:          [facility] [place] 04/22/2014, 2:31 PM Van Clines, PT  Acute Rehabilitation Services Pager [phone] Office [phone]

## 2014-04-22 NOTE — Progress Notes (Signed)
CARE MANAGEMENT NOTE 04/22/2014  Patient:  [name], [name]   Account Number:  [id number]  Date Initiated:  04/22/2014  Documentation initiated by:  Darlyne Russian  Subjective/Objective Assessment:   admitted with increased weakness and confusion     Action/Plan:   progression of care and discharge planning   Anticipated DC Date:  04/25/2014   Anticipated DC Plan:  SKILLED NURSING FACILITY         Choice offered to / List presented to:             Status of service:   Medicare Important Message given?   (If response is "NO", the following Medicare IM given date fields will be blank) Date Medicare IM given:   Date Additional Medicare IM given:    Discharge Disposition:    Per UR Regulation:    If discussed at Long Length of Stay Meetings, dates discussed:    Comments:

## 2014-04-22 NOTE — H&P (Signed)
[facility] PROGRESS NOTE  [name] QVZ:[id number] DOB: Dec 04, 1930 DOA: 04/21/2014 PCP: Alice Reichert, MD  Assessment/Plan:  Failure to thrive/functional decline: Thought to be secondary to dehydration. Also neck pain, arthritis might play a role. I will resume Lasix. Initial lab work performed in the emergency room did not reveal obvious infection, she presents afebrile.    Encephalopathy; confusion per family. Patient was alert, oriented to place and situation. CT head negative. Pain medication might play a role in confusion.   Chronic diastolic congestive heart failure; resume lasix today. Consider resume Zaroxolyn depending volume status and renal functio.  Neck pain: CT scan of the cervical spine showing severe multilevel cervical spondylosis. . Bulky degenerative changes at the atlantodental interval.  Differential considerations include advanced osteoarthritis, rheumatoid arthritis and calcium pyrophosphate deposition disease  (pseudogout). I will order one time dose IV solumedrol. Will order prednisone for tomorrow. Steroid might help with pain.   Bilateral LE edema; resume lasix. Doppler negative for DVT.   DVT prophylaxis. Subcutaneous heparin   Code Status: Full Code.  Family Communication: care discussed with patient.  Disposition Plan: Remain inpatient.    Consultants:  none  Procedures:  none  Antibiotics:  none  HPI/Subjective: She is complaining of severe neck pain.    Objective: Filed Vitals:   04/22/14 0917  BP: 174/76  Pulse: 105  Temp: 98 F (36.7 C)  Resp: 19    Intake/Output Summary (Last 24 hours) at 04/22/14 1716 Last data filed at 04/22/14 0916  Gross per 24 hour  Intake    240 ml  Output   2270 ml  Net  -2030 ml   Filed Weights   04/21/14 2352  Weight: 80.74 kg (178 lb)    Exam:   General:  No distress.   Cardiovascular: S 1, S 2 RRR  Respiratory: CTA  Abdomen: BS present, soft, NT  Musculoskeletal: BL  LE edema, venous stasis changes, mild redness.   Data Reviewed: Basic Metabolic Panel:  Recent Labs Lab 04/21/14 1730 04/21/14 1809 04/22/14 0210  NA 137 138 137  K 4.0 3.8 3.5*  CL 95* 95* 95*  CO2 26  --  27  GLUCOSE 108* 112* 107*  BUN 28* 27* 23  CREATININE 0.96 1.10 0.86  CALCIUM 10.4  --  9.6   Liver Function Tests:  Recent Labs Lab 04/21/14 1742  AST 21  ALT 6  ALKPHOS 106  BILITOT 0.4  PROT 8.2  ALBUMIN 3.3*    Recent Labs Lab 04/21/14 1742  LIPASE 15   No results found for this basename: AMMONIA,  in the last 168 hours CBC:  Recent Labs Lab 04/21/14 1730 04/21/14 1809 04/22/14 0210  WBC 9.0  --  8.8  HGB 12.2 13.3 11.1*  HCT 35.0* 39.0 33.3*  MCV 86.4  --  86.4  PLT 336  --  325   Cardiac Enzymes: No results found for this basename: CKTOTAL, CKMB, CKMBINDEX, TROPONINI,  in the last 168 hours BNP (last 3 results)  Recent Labs  04/21/14 1742  PROBNP 1164.0*   CBG:  Recent Labs Lab 04/21/14 1752  GLUCAP 103*    No results found for this or any previous visit (from the past 240 hour(s)).   Studies: Dg Chest 2 View  04/21/2014   CLINICAL DATA:  Shortness of breath  EXAM: CHEST  2 VIEW  COMPARISON:  12/10/2011  FINDINGS: Stable cardiomegaly and aortic tortuosity. Dual-chamber pacer from the left has unchanged lead orientation given differences in projection.  Chronic elevation of the right diaphragm, likely related to eventration. Minimal atelectasis at the bases. No edema, pneumonia, significant effusion, or pneumothorax. Severe bilateral glenohumeral osteoarthritis.  IMPRESSION: Mild atelectasis at the bases.  No evidence of pneumonia or edema.   Electronically Signed   By: Tiburcio Pea M.D.   On: 04/21/2014 21:17   Ct Head Wo Contrast  04/21/2014   CLINICAL DATA:  Weakness  EXAM: CT HEAD WITHOUT CONTRAST  CT CERVICAL SPINE WITHOUT CONTRAST  TECHNIQUE: Multidetector CT imaging of the head and cervical spine was performed following the  standard protocol without intravenous contrast. Multiplanar CT image reconstructions of the cervical spine were also generated.  COMPARISON:  None.  FINDINGS: CT HEAD FINDINGS  Negative for acute intracranial hemorrhage, acute infarction, mass, mass effect, hydrocephalus or midline shift. Gray-white differentiation is preserved throughout. No focal soft tissue or calvarial abnormality. Bilateral globes and orbits are symmetric and unremarkable. Normal aeration of the mastoid air cells and visualized paranasal sinuses. Trace atherosclerotic calcification in the bilateral cavernous carotid arteries.  CT CERVICAL SPINE FINDINGS  No acute fracture, malalignment or prevertebral soft tissue swelling. Severe multilevel cervical spondylosis with near-total loss of disc space height at C3-C4, C4-C5, C5-C6 and C6-C7. Posterior disc osteophyte complexes result in moderate to severe narrowing at C5-C6. The there is some minimal (1- 2 mm) anterolisthesis of C2 on C3 which is likely degenerative in etiology. Mild reversal the normal cervical lordosis. Bulky degenerative changes present at the atlantodental interval. Partial ossification of the main the ligamentum flavum. Multilevel facet arthropathy most severe at C2-C3. Unremarkable CT appearance of the thyroid gland. No acute soft tissue abnormality. The lung apices are unremarkable. A 9 mm hypoattenuating nodule in the right thyroid gland is nonspecific by CT. No further followup is warranted for this sub cm nodule.  IMPRESSION: CT HEAD  1. No acute intracranial abnormality. 2. Mild intracranial atherosclerotic calcifications. CT CSPINE  1. No acute fracture or malalignment. 2. Severe multilevel cervical spondylosis with moderate to advanced central stenosis at C5-C6, multilevel facet arthropathy most severe at C2-C3 and degenerative reversal of the normal cervical lordosis. 3. Partial ossification of the ligamentum flavum. 4. Bulky degenerative changes at the atlantodental  interval. Differential considerations include advanced osteoarthritis, rheumatoid arthritis and calcium pyrophosphate deposition disease (pseudogout).   Electronically Signed   By: Malachy Moan M.D.   On: 04/21/2014 20:13   Ct Cervical Spine Wo Contrast  04/21/2014   CLINICAL DATA:  Weakness  EXAM: CT HEAD WITHOUT CONTRAST  CT CERVICAL SPINE WITHOUT CONTRAST  TECHNIQUE: Multidetector CT imaging of the head and cervical spine was performed following the standard protocol without intravenous contrast. Multiplanar CT image reconstructions of the cervical spine were also generated.  COMPARISON:  None.  FINDINGS: CT HEAD FINDINGS  Negative for acute intracranial hemorrhage, acute infarction, mass, mass effect, hydrocephalus or midline shift. Gray-white differentiation is preserved throughout. No focal soft tissue or calvarial abnormality. Bilateral globes and orbits are symmetric and unremarkable. Normal aeration of the mastoid air cells and visualized paranasal sinuses. Trace atherosclerotic calcification in the bilateral cavernous carotid arteries.  CT CERVICAL SPINE FINDINGS  No acute fracture, malalignment or prevertebral soft tissue swelling. Severe multilevel cervical spondylosis with near-total loss of disc space height at C3-C4, C4-C5, C5-C6 and C6-C7. Posterior disc osteophyte complexes result in moderate to severe narrowing at C5-C6. The there is some minimal (1- 2 mm) anterolisthesis of C2 on C3 which is likely degenerative in etiology. Mild reversal the normal cervical lordosis. Bulky  degenerative changes present at the atlantodental interval. Partial ossification of the main the ligamentum flavum. Multilevel facet arthropathy most severe at C2-C3. Unremarkable CT appearance of the thyroid gland. No acute soft tissue abnormality. The lung apices are unremarkable. A 9 mm hypoattenuating nodule in the right thyroid gland is nonspecific by CT. No further followup is warranted for this sub cm nodule.   IMPRESSION: CT HEAD  1. No acute intracranial abnormality. 2. Mild intracranial atherosclerotic calcifications. CT CSPINE  1. No acute fracture or malalignment. 2. Severe multilevel cervical spondylosis with moderate to advanced central stenosis at C5-C6, multilevel facet arthropathy most severe at C2-C3 and degenerative reversal of the normal cervical lordosis. 3. Partial ossification of the ligamentum flavum. 4. Bulky degenerative changes at the atlantodental interval. Differential considerations include advanced osteoarthritis, rheumatoid arthritis and calcium pyrophosphate deposition disease (pseudogout).   Electronically Signed   By: Malachy MoanHeath  McCullough M.D.   On: 04/21/2014 20:13    Scheduled Meds: . furosemide  80 mg Oral BID  . heparin  5,000 Units Subcutaneous 3 times per day  . magnesium oxide  400 mg Oral Daily  . methylPREDNISolone (SOLU-MEDROL) injection  60 mg Intravenous Once  . [START ON 04/23/2014] predniSONE  40 mg Oral Q breakfast  . sodium chloride  3 mL Intravenous Q12H   Continuous Infusions:   Principal Problem:   FTT (failure to thrive) in adult Active Problems:   Edema   Hypertension   Pacemaker   Chronic diastolic CHF (congestive heart failure)   Weakness    Time spent: 35 minutes.     Arthur Aydelotte A Alaysia Lightle  [facility] Pager [phone]. If 7PM-7AM, please contact night-coverage at [email or web address], password [email or web address] 04/22/2014, 5:16 PM  LOS: 1 day

## 2014-04-23 ENCOUNTER — Telehealth: Payer: Self-pay | Admitting: Cardiology

## 2014-04-23 DIAGNOSIS — M542 Cervicalgia: Secondary | ICD-10-CM | POA: Diagnosis not present

## 2014-04-23 DIAGNOSIS — I5032 Chronic diastolic (congestive) heart failure: Secondary | ICD-10-CM | POA: Diagnosis not present

## 2014-04-23 DIAGNOSIS — R627 Adult failure to thrive: Secondary | ICD-10-CM | POA: Diagnosis not present

## 2014-04-23 DIAGNOSIS — R609 Edema, unspecified: Secondary | ICD-10-CM | POA: Diagnosis not present

## 2014-04-23 NOTE — Progress Notes (Signed)
[facility] PROGRESS NOTE  [name] ZOX:[id number] DOB: [birth year] DOA: 04/21/2014  PCP: Alice Reichert, MD  Brief HPI: [name] is a 80 y.o. female with a past medical history of chronic diastolic congestive heart failure, last transthoracic echocardiogram performed in 2012 which showed preserved ejection fraction, chronic lower tremor the edema, status post pacemaker implant in 2013. She presented with complaints of functional decline/failure to thrive. Family members reported that patient had become progressively weak over the past 2 weeks.   Past medical history:  Past Medical History  Diagnosis Date  . Edema     . EF 65-70%, echo, November 29, 2010  . Pulmonary hypertension      46 mmHg... echo... January, 2012  . Cellulitis     Superficial cellulitis right lower leg   January 14, 2011  . AV block      2:1  January 14, 2011 / pacemaker plan when cellulitis resolved in length  . Bradycardia     2:1  AV block  . Hypokalemia     October, 2012  . Ejection fraction     EF 65%, echo, January, 2012  . Murmur     No significant valvular disease by echo, January, 2012  . Pacemaker     January, 2013  . Chronic diastolic CHF (congestive heart failure)     Consultants: None  Procedures: None  Antibiotics: None  Subjective: Patient feels weak but stronger today. No other complaints.  Objective: Vital Signs  Filed Vitals:   04/22/14 1824 04/22/14 2111 04/22/14 2120 04/23/14 0419  BP: 130/50 126/81  148/70  Pulse: 94 101  90  Temp: 97.6 F (36.4 C) 97.7 F (36.5 C)  97.7 F (36.5 C)  TempSrc: Oral Oral  Oral  Resp: Height:      Weight:   81.874 kg (180 lb 8 oz)   SpO2: 96% 98%  96%    Intake/Output Summary (Last 24 hours) at 04/23/14 0856 Last data filed at 04/23/14 0645  Gross per 24 hour  Intake    720 ml  Output   1250 ml  Net   -530 ml   Filed Weights   04/21/14 2352 04/22/14 2120  Weight: 80.74 kg (178 lb) 81.874 kg  (180 lb 8 oz)   General appearance: alert, cooperative, appears stated age and no distress Resp: clear to auscultation bilaterally Cardio: regular rate and rhythm, S1, S2 normal, no murmur, click, rub or gallop GI: soft, non-tender; bowel sounds normal; no masses,  no organomegaly Extremities: edema 1+ in bilateral LE Neurologic: No focal deficits. Good strength in bilateral UE.  Lab Results:  Basic Metabolic Panel:  Recent Labs Lab 04/21/14 1730 04/21/14 1809 04/22/14 0210  NA 137 138 137  K 4.0 3.8 3.5*  CL 95* 95* 95*  CO2 26  --  27  GLUCOSE 108* 112* 107*  BUN 28* 27* 23  CREATININE 0.96 1.10 0.86  CALCIUM 10.4  --  9.6   Liver Function Tests:  Recent Labs Lab 04/21/14 1742  AST 21  ALT 6  ALKPHOS 106  BILITOT 0.4  PROT 8.2  ALBUMIN 3.3*    Recent Labs Lab 04/21/14 1742  LIPASE 15   CBC:  Recent Labs Lab 04/21/14 1730 04/21/14 1809 04/22/14 0210  WBC 9.0  --  8.8  HGB 12.2 13.3 11.1*  HCT 35.0* 39.0 33.3*  MCV 86.4  --  86.4  PLT 336  --  325  BNP (last 3 results)  Recent Labs  04/21/14 1742  PROBNP 1164.0*   CBG:  Recent Labs Lab 04/21/14 1752  GLUCAP 103*    Studies/Results: Dg Chest 2 View  04/21/2014   CLINICAL DATA:  Shortness of breath  EXAM: CHEST  2 VIEW  COMPARISON:  12/10/2011  FINDINGS: Stable cardiomegaly and aortic tortuosity. Dual-chamber pacer from the left has unchanged lead orientation given differences in projection.  Chronic elevation of the right diaphragm, likely related to eventration. Minimal atelectasis at the bases. No edema, pneumonia, significant effusion, or pneumothorax. Severe bilateral glenohumeral osteoarthritis.  IMPRESSION: Mild atelectasis at the bases.  No evidence of pneumonia or edema.   Electronically Signed   By: Tiburcio PeaJonathan  Watts M.D.   On: 04/21/2014 21:17   Ct Head Wo Contrast  04/21/2014   CLINICAL DATA:  Weakness  EXAM: CT HEAD WITHOUT CONTRAST  CT CERVICAL SPINE WITHOUT CONTRAST  TECHNIQUE:  Multidetector CT imaging of the head and cervical spine was performed following the standard protocol without intravenous contrast. Multiplanar CT image reconstructions of the cervical spine were also generated.  COMPARISON:  None.  FINDINGS: CT HEAD FINDINGS  Negative for acute intracranial hemorrhage, acute infarction, mass, mass effect, hydrocephalus or midline shift. Gray-white differentiation is preserved throughout. No focal soft tissue or calvarial abnormality. Bilateral globes and orbits are symmetric and unremarkable. Normal aeration of the mastoid air cells and visualized paranasal sinuses. Trace atherosclerotic calcification in the bilateral cavernous carotid arteries.  CT CERVICAL SPINE FINDINGS  No acute fracture, malalignment or prevertebral soft tissue swelling. Severe multilevel cervical spondylosis with near-total loss of disc space height at C3-C4, C4-C5, C5-C6 and C6-C7. Posterior disc osteophyte complexes result in moderate to severe narrowing at C5-C6. The there is some minimal (1- 2 mm) anterolisthesis of C2 on C3 which is likely degenerative in etiology. Mild reversal the normal cervical lordosis. Bulky degenerative changes present at the atlantodental interval. Partial ossification of the main the ligamentum flavum. Multilevel facet arthropathy most severe at C2-C3. Unremarkable CT appearance of the thyroid gland. No acute soft tissue abnormality. The lung apices are unremarkable. A 9 mm hypoattenuating nodule in the right thyroid gland is nonspecific by CT. No further followup is warranted for this sub cm nodule.  IMPRESSION: CT HEAD  1. No acute intracranial abnormality. 2. Mild intracranial atherosclerotic calcifications. CT CSPINE  1. No acute fracture or malalignment. 2. Severe multilevel cervical spondylosis with moderate to advanced central stenosis at C5-C6, multilevel facet arthropathy most severe at C2-C3 and degenerative reversal of the normal cervical lordosis. 3. Partial  ossification of the ligamentum flavum. 4. Bulky degenerative changes at the atlantodental interval. Differential considerations include advanced osteoarthritis, rheumatoid arthritis and calcium pyrophosphate deposition disease (pseudogout).   Electronically Signed   By: Malachy MoanHeath  McCullough M.D.   On: 04/21/2014 20:13   Ct Cervical Spine Wo Contrast  04/21/2014   CLINICAL DATA:  Weakness  EXAM: CT HEAD WITHOUT CONTRAST  CT CERVICAL SPINE WITHOUT CONTRAST  TECHNIQUE: Multidetector CT imaging of the head and cervical spine was performed following the standard protocol without intravenous contrast. Multiplanar CT image reconstructions of the cervical spine were also generated.  COMPARISON:  None.  FINDINGS: CT HEAD FINDINGS  Negative for acute intracranial hemorrhage, acute infarction, mass, mass effect, hydrocephalus or midline shift. Gray-white differentiation is preserved throughout. No focal soft tissue or calvarial abnormality. Bilateral globes and orbits are symmetric and unremarkable. Normal aeration of the mastoid air cells and visualized paranasal sinuses. Trace atherosclerotic calcification in the  bilateral cavernous carotid arteries.  CT CERVICAL SPINE FINDINGS  No acute fracture, malalignment or prevertebral soft tissue swelling. Severe multilevel cervical spondylosis with near-total loss of disc space height at C3-C4, C4-C5, C5-C6 and C6-C7. Posterior disc osteophyte complexes result in moderate to severe narrowing at C5-C6. The there is some minimal (1- 2 mm) anterolisthesis of C2 on C3 which is likely degenerative in etiology. Mild reversal the normal cervical lordosis. Bulky degenerative changes present at the atlantodental interval. Partial ossification of the main the ligamentum flavum. Multilevel facet arthropathy most severe at C2-C3. Unremarkable CT appearance of the thyroid gland. No acute soft tissue abnormality. The lung apices are unremarkable. A 9 mm hypoattenuating nodule in the right thyroid  gland is nonspecific by CT. No further followup is warranted for this sub cm nodule.  IMPRESSION: CT HEAD  1. No acute intracranial abnormality. 2. Mild intracranial atherosclerotic calcifications. CT CSPINE  1. No acute fracture or malalignment. 2. Severe multilevel cervical spondylosis with moderate to advanced central stenosis at C5-C6, multilevel facet arthropathy most severe at C2-C3 and degenerative reversal of the normal cervical lordosis. 3. Partial ossification of the ligamentum flavum. 4. Bulky degenerative changes at the atlantodental interval. Differential considerations include advanced osteoarthritis, rheumatoid arthritis and calcium pyrophosphate deposition disease (pseudogout).   Electronically Signed   By: Malachy Moan M.D.   On: 04/21/2014 20:13    Medications:  Scheduled: . furosemide  80 mg Oral BID  . heparin  5,000 Units Subcutaneous 3 times per day  . magnesium oxide  400 mg Oral Daily  . potassium chloride  20 mEq Oral BID  . predniSONE  40 mg Oral Q breakfast  . sodium chloride  3 mL Intravenous Q12H   Continuous:  TYO:MAYOKH chloride, alum & mag hydroxide-simeth, diazepam, diphenhydrAMINE-zinc acetate, ondansetron (ZOFRAN) IV, ondansetron, sodium chloride, sorbitol, traMADol  Assessment/Plan:  Principal Problem:   FTT (failure to thrive) in adult Active Problems:   Edema   Hypertension   Pacemaker   Chronic diastolic CHF (congestive heart failure)   Weakness    Failure to thrive/functional decline Initially thought to be secondary to dehydration. Also neck pain, arthritis might be playing a role. No other active issues identified. PT/OT following and SNF recommended. Patient reluctant to go to SNF and wants to talk to her children about this.  Encephalopathy Confusion per family. But does not appear to be confused currently. CT head negative. Pain medication might have played a role in confusion.   Chronic diastolic congestive heart failure Back on  oral Lasix. Replete K. Reasonably well compensated.   Neck pain CT scan of the cervical spine showing severe multilevel cervical spondylosis. Bulky degenerative changes at the atlantodental interval. Differential considerations include advanced osteoarthritis, rheumatoid arthritis and calcium pyrophosphate deposition disease (pseudogout). She is on steroids currently. No neurological deficits.   Bilateral LE edema On Lasix. Doppler negative for DVT.   Code Status: Full Code  DVT Prophylaxis: Heparin    Family Communication: Discussed with Lucila Maine at [phone]  Disposition Plan: Unclear for now: SNF versus home with [facility]. Daughter wants her to go to SNF if possible. Sons to come in today.    LOS: 2 days   Osvaldo Shipper  [facility] Pager [phone] 04/23/2014, 8:56 AM  If 8PM-8AM, please contact night-coverage at [email or web address], password [email or web address]   Disclaimer: This note was dictated with voice recognition software. Similar sounding words can inadvertently be transcribed and may not be corrected upon review.

## 2014-04-23 NOTE — Telephone Encounter (Signed)
The pts daughter, [name], states that the MD's taking care of her mother at [facility]. is recommending that she go to rehab when she is released from the hospital but the pt is undecided. [name] asked me if I would call the pt and talk to her about rehab and try to convince her to go. I agreed and called the pt.  I asked her if she was going to go to rehab when she leaves the hospital and she stated that she is not sure since she is feeling better right now. I told her that Dr Myrtis Ser and I think that rehab would be a good idea and that she would benefit a great deal if she will go. She then stated "ok I might go for a week but then I want to go home". I encouraged her to follow her MD's advise and she stated she would do her best. I also spoke with her son, [name], who told me that she told him earlier today that she is going to rehab and that he signed papers for her to go.

## 2014-04-23 NOTE — Telephone Encounter (Signed)
Pt's daughter is calling. Patient is in the hospital and she would like to speak with you. Please call back.

## 2014-04-24 DIAGNOSIS — M542 Cervicalgia: Secondary | ICD-10-CM | POA: Diagnosis not present

## 2014-04-24 DIAGNOSIS — R609 Edema, unspecified: Secondary | ICD-10-CM | POA: Diagnosis not present

## 2014-04-24 DIAGNOSIS — R627 Adult failure to thrive: Secondary | ICD-10-CM | POA: Diagnosis not present

## 2014-04-24 DIAGNOSIS — I5032 Chronic diastolic (congestive) heart failure: Secondary | ICD-10-CM | POA: Diagnosis not present

## 2014-04-24 LAB — CBC
HCT: 33.5 % — ABNORMAL LOW (ref 36.0–46.0)
HEMOGLOBIN: 11.6 g/dL — AB (ref 12.0–15.0)
MCH: 29.7 pg (ref 26.0–34.0)
MCHC: 34.6 g/dL (ref 30.0–36.0)
MCV: 85.9 fL (ref 78.0–100.0)
Platelets: 403 10*3/uL — ABNORMAL HIGH (ref 150–400)
RBC: 3.9 MIL/uL (ref 3.87–5.11)
RDW: 14.3 % (ref 11.5–15.5)
WBC: 10.1 10*3/uL (ref 4.0–10.5)

## 2014-04-24 LAB — BASIC METABOLIC PANEL
BUN: 24 mg/dL — ABNORMAL HIGH (ref 6–23)
CHLORIDE: 99 meq/L (ref 96–112)
CO2: 27 meq/L (ref 19–32)
Calcium: 9.5 mg/dL (ref 8.4–10.5)
Creatinine, Ser: 0.77 mg/dL (ref 0.50–1.10)
GFR calc Af Amer: 90 mL/min — ABNORMAL LOW (ref >90)
GFR, EST NON AFRICAN AMERICAN: 77 mL/min — AB (ref >90)
GLUCOSE: 139 mg/dL — AB (ref 70–99)
Potassium: 3.3 mEq/L — ABNORMAL LOW (ref 3.7–5.3)
SODIUM: 140 meq/L (ref 137–147)

## 2014-04-24 MED ORDER — WHITE PETROLATUM GEL
Status: AC
  Administered 2014-04-24: 14:00:00
  Filled 2014-04-24: qty 5

## 2014-04-24 MED ORDER — METOLAZONE 2.5 MG PO TABS: 2.5000 mg | ORAL_TABLET | Freq: Every day | ORAL | Status: DC | PRN

## 2014-04-24 NOTE — Clinical Social Work Psychosocial (Addendum)
Clinical Social Work Department BRIEF PSYCHOSOCIAL ASSESSMENT 04/24/2014  Patient:  [name], [name]     Account Number:  [id number]     Admit date:  04/21/2014  Clinical Social Worker:  Delmer Islam  Date/Time:  04/24/2014 03:32 AM  Referred by:  Physician  Date Referred:  04/23/2014 Referred for  SNF Placement   Other Referral:   Interview type:  Patient Other interview type:   CSW also talked with family members Vesta Delashmit [phone]) and daughter Rosezella [place].    PSYCHOSOCIAL DATA Living Status:  ALONE Admitted from facility:   Level of care:   Primary support name:  Analeia, Leiphart Primary support relationship to patient:  CHILD, ADULT Degree of support available:   Patient has several children, all of whom have varying levels of support regarding patient. Pete Glatter and America Babilonia respectively are listed as patient's contacts.    CURRENT CONCERNS Current Concerns  Post-Acute Placement   Other Concerns:    SOCIAL WORK ASSESSMENT / PLAN On 04/23/14 CSW visited with patient and talked with her and son [name] regarding discharge planning and MD's recommendation of short-term rehab at a skilled facility. CSW talked with patient and son for approximately an hour regarding this matter. Patient adamantly refused SNF during the conversation.    Son [name] expressed great concern with patient going home. He expressed his concern regarding patient going home and reported that her mobility is not good. Son reported that a lady comes to help patient 3 hours a day, M-F and a family friend checks on her daily in the morning and at night, but she is alone the rest of the time. Family-wise, his brother Tandy Gaw checks on patient 3/4 days a week and his sister [name] (lives in [place], [place]) stayed with patient recently for approx.. 4 days. Per [name], patient stays in her lift chair all day and sleeps in it at night. Patient disputed this and reported that she had slept  in her bed for the past several nights and also will get out of the chair to clean her refrigerator. [name] contacted his sister [name] and asked CSW to talk with her about patient. [name] reported that patient does not need to be alone and she needs 2 person assist to get out of bed when she and her husband stayed with her recently. Daughter understands that patient can make her own decisions, but really wants her to go to rehab. CSW continued to talk with patient and she continued to refuse ST rehab. Patient advised that MD will be updated on our conversation.    On 04/24/14 CSW informed by MD that he talked with patient and she is now agreeable to rehab. CSW visited with patient and one of her granddaughter's who was in the room. Patient is agreeable and wanted CSW to talk with her son [name]. Call made to son and he was advised of patient's change of mind. He was emailed SNF list and informed that a list would be left in room. Patient lives in [place] but CSW informed that they want a facility in [place]. Patient's granddaughter given medicare.gov website to gain information about facilities in [place].   Assessment/plan status:  Psychosocial Support/Ongoing Assessment of Needs Other assessment/ plan:   Information/referral to community resources:   SNF List for [facility].    PATIENT'S/FAMILY'S RESPONSE TO PLAN OF CARE: Patient pleasant and very talkative but adamant about going home. CSW utilized active listening and expressed support and empathy  during the conversation. On 6/4 patient agreeable and wanted son contacted and involved in decision making and SNF choices. Son very relieved that patient agreeable to SNF.

## 2014-04-24 NOTE — Progress Notes (Signed)
[facility] PROGRESS NOTE  [name] WUJ:[id number] DOB: 1930-12-06 DOA: 04/21/2014  PCP: [name]  Brief HPI: [name] is a 80 y.o. female with a past medical history of chronic diastolic congestive heart failure, last transthoracic echocardiogram performed in 2012 which showed preserved ejection fraction, chronic lower tremor the edema, status post pacemaker implant in 2013. She presented with complaints of functional decline/failure to thrive. Family members reported that patient had become progressively weak over the past 2 weeks.   Past medical history:  Past Medical History  Diagnosis Date  . Edema     . EF 65-70%, echo, November 29, 2010  . Pulmonary hypertension      46 mmHg... echo... January, 2012  . Cellulitis     Superficial cellulitis right lower leg   January 14, 2011  . AV block      2:1  January 14, 2011 / pacemaker plan when cellulitis resolved in length  . Bradycardia     2:1  AV block  . Hypokalemia     October, 2012  . Ejection fraction     EF 65%, echo, January, 2012  . Murmur     No significant valvular disease by echo, January, 2012  . Pacemaker     January, 2013  . Chronic diastolic CHF (congestive heart failure)     Consultants: None  Procedures: None  Antibiotics: None  Subjective: Patient feels better today. Neck pain is better. Agreeable to go to SNF.  Objective: Vital Signs  Filed Vitals:   04/23/14 1759 04/23/14 2101 04/24/14 0638 04/24/14 0851  BP: 134/80 131/60 132/71 138/66  Pulse: 77 92 80 80  Temp: 98.2 F (36.8 C) 98 F (36.7 C) 97.8 F (36.6 C) 98.4 F (36.9 C)  TempSrc: Oral Oral Oral Oral  Resp: 21 20 20 20   Height:      Weight:  80.513 kg (177 lb 8 oz)    SpO2: 95% 96% 95% 95%    Intake/Output Summary (Last 24 hours) at 04/24/14 1544 Last data filed at 04/24/14 1355  Gross per 24 hour  Intake    680 ml  Output   3275 ml  Net  -2595 ml   Filed Weights   04/21/14 2352 04/22/14  2120 04/23/14 2101  Weight: 80.74 kg (178 lb) 81.874 kg (180 lb 8 oz) 80.513 kg (177 lb 8 oz)   General appearance: alert, cooperative, appears stated age and no distress Resp: clear to auscultation bilaterally Cardio: regular rate and rhythm, S1, S2 normal, no murmur, click, rub or gallop GI: soft, non-tender; bowel sounds normal; no masses,  no organomegaly Extremities: edema 1+ in bilateral LE Neurologic: No focal deficits. Good strength in bilateral UE.  Lab Results:  Basic Metabolic Panel:  Recent Labs Lab 04/21/14 1730 04/21/14 1809 04/22/14 0210 04/24/14 0510  NA 137 138 137 140  K 4.0 3.8 3.5* 3.3*  CL 95* 95* 95* 99  CO2 26  --  27 27  GLUCOSE 108* 112* 107* 139*  BUN 28* 27* 23 24*  CREATININE 0.96 1.10 0.86 0.77  CALCIUM 10.4  --  9.6 9.5   Liver Function Tests:  Recent Labs Lab 04/21/14 1742  AST 21  ALT 6  ALKPHOS 106  BILITOT 0.4  PROT 8.2  ALBUMIN 3.3*    Recent Labs Lab 04/21/14 1742  LIPASE 15   CBC:  Recent Labs Lab 04/21/14 1730 04/21/14 1809 04/22/14 0210 04/24/14 0510  WBC 9.0  --  8.8  10.1  HGB 12.2 13.3 11.1* 11.6*  HCT 35.0* 39.0 33.3* 33.5*  MCV 86.4  --  86.4 85.9  PLT 336  --  325 403*   BNP (last 3 results)  Recent Labs  04/21/14 1742  PROBNP 1164.0*   CBG:  Recent Labs Lab 04/21/14 1752  GLUCAP 103*    Studies/Results: No results found.  Medications:  Scheduled: . furosemide  80 mg Oral BID  . heparin  5,000 Units Subcutaneous 3 times per day  . magnesium oxide  400 mg Oral Daily  . potassium chloride  20 mEq Oral BID  . predniSONE  40 mg Oral Q breakfast  . sodium chloride  3 mL Intravenous Q12H   Continuous:  LSL:HTDSKA chloride, alum & mag hydroxide-simeth, diazepam, diphenhydrAMINE-zinc acetate, ondansetron (ZOFRAN) IV, ondansetron, sodium chloride, sorbitol, traMADol  Assessment/Plan:  Principal Problem:   FTT (failure to thrive) in adult Active Problems:   Edema   Hypertension    Pacemaker   Chronic diastolic CHF (congestive heart failure)   Weakness    Failure to thrive/functional decline Initially thought to be secondary to dehydration. Also neck pain, arthritis might be playing a role. No other active issues identified. PT/OT following and SNF recommended. Patient was initially reluctant to go to SNF but now seems to be agreeable. CSW is following.   Acute Encephalopathy Seems to be back to baseline. Unclear etiology. CT head was negative. Pain medication might have played a role in confusion.   Chronic diastolic congestive heart failure Continue oral Lasix. On KCL as well. Reasonably well compensated.   Neck pain CT scan of the cervical spine showing severe multilevel cervical spondylosis. Bulky degenerative changes at the atlantodental interval. Differential considerations include advanced osteoarthritis, rheumatoid arthritis and calcium pyrophosphate deposition disease (pseudogout). She is on steroids currently which will need to be tapered. No neurological deficits. She states that pain is improving.  Bilateral LE edema Stable. Continue Lasix. Doppler negative for DVT.   Code Status: Full Code  DVT Prophylaxis: Heparin    Family Communication: Discussed with patient and granddaughter at bedside. Daughter [name] [phone]  Disposition Plan: SNF. Possible DC 6/5.     LOS: 3 days   Osvaldo Shipper  [facility] Pager [phone] 04/24/2014, 3:44 PM  If 8PM-8AM, please contact night-coverage at [email or web address], password [email or web address]   Disclaimer: This note was dictated with voice recognition software. Similar sounding words can inadvertently be transcribed and may not be corrected upon review.

## 2014-04-24 NOTE — Clinical Social Work Placement (Addendum)
Clinical Social Work Department CLINICAL SOCIAL WORK PLACEMENT NOTE 04/24/2014  Patient:  [name], [name]  Account Number:  [id number] Admit date:  04/21/2014  Clinical Social Worker:  Genelle Bal, LCSW  Date/time:  04/24/2014 04:17 AM  Clinical Social Work is seeking post-discharge placement for this patient at the following level of care:   SKILLED NURSING   (*CSW will update this form in Epic as items are completed)   04/24/2014  Patient/family provided with [facility] Department of Clinical Social Work's list of facilities offering this level of care within the geographic area requested by the patient (or if unable, by the patient's family).  04/23/2014  Patient/family informed of their freedom to choose among providers that offer the needed level of care, that participate in Medicare, Medicaid or managed care program needed by the patient, have an available bed and are willing to accept the patient.    Patient/family informed of [facility]' ownership interest in [facility], as well as of the fact that they are under no obligation to receive care at this facility.  PASARR submitted to EDS on 04/24/2014 PASARR number received from [employer] on 04/24/2014  FL2 transmitted to all facilities in geographic area requested by pt/family on  04/24/2014 FL2 transmitted to all facilities within larger geographic area on   Patient informed that his/her managed care company has contracts with or will negotiate with  certain facilities, including the following:     Patient/family informed of bed offers received: 04/24/14  Patient chooses bed at [facility].  Physician recommends and patient chooses bed at    Patient to be transferred to [facility] on 04/25/14   Patient to be transferred to facility by ambulance  The following physician request were entered in Epic:   Additional Comments: 04/25/14 - Family at the bedside, aware of d/c. Son [name] completed admissions paperwork at  [facility]

## 2014-04-24 NOTE — Progress Notes (Signed)
Physical Therapy Treatment Patient Details Name: [name] MRN: [id number] DOB: [birth year] Today's Date: 04/24/2014    History of Present Illness HPI: [name] is a 80 y.o. female with a past medical history of chronic diastolic congestive heart failure, last transthoracic echocardiogram performed in 2012 which showed preserved ejection fraction, chronic lower tremor the edema, status post pacemaker implant in 2013, resecting to the emergency department with complaints of functional decline/failure to thrive. Family members report that patient has become progressively weak over the past 2 weeks. At baseline she is able to ambulate with walker however family members reporting that lately she has required significant assistance is getting her out of bed. Family members also reporting increased confusion, disoriented, "not acting herself". Over the last 24-48 hours she has been unable to ambulate. She denies fevers, chills, nausea, vomiting, diarrhea, constipation, dysuria. Workup in the emergency room included a urinalysis which was negative, chest x-ray which does not reveal evidence of pneumonia or acute CHF.    PT Comments    Pt able to ambulate 5 feet today.  Continue to recommend SNF for short term rehab before returning home with family.  Follow Up Recommendations  SNF;Supervision/Assistance - 24 hour     Equipment Recommendations       Recommendations for Other Services       Precautions / Restrictions Precautions Precautions: Fall    Mobility  Bed Mobility                  Transfers Overall transfer level: Needs assistance Equipment used: Rolling walker (2 wheeled) Transfers: Sit to/from Stand Sit to Stand: Mod assist;+2 physical assistance         General transfer comment: cueing for hand placement. MIN of 2 for toilet transfers.  Ambulation/Gait Ambulation/Gait assistance: +2 safety/equipment;Mod assist Ambulation Distance (Feet): 5  Feet Assistive device: Rolling walker (2 wheeled) Gait Pattern/deviations: Wide base of support;Decreased step length - right;Decreased step length - left Gait velocity: decreased   General Gait Details: Ambulated 5 feet with recliner follow with MOD A and decreased step length B';ly.  Some reports of mild dizziness that subsided with sitting.   Stairs            Wheelchair Mobility    Modified Rankin (Stroke Patients Only)       Balance                                    Cognition Arousal/Alertness: Awake/alert Behavior During Therapy: WFL for tasks assessed/performed Overall Cognitive Status: Within Functional Limits for tasks assessed                      Exercises      General Comments        Pertinent Vitals/Pain Denies pain.    Home Living                      Prior Function            PT Goals (current goals can now be found in the care plan section) Acute Rehab PT Goals Patient Stated Goal: To walk Progress towards PT goals: Progressing toward goals    Frequency  Min 3X/week    PT Plan Current plan remains appropriate    Co-evaluation             End of Session Equipment Utilized During  Treatment: Gait belt Activity Tolerance: Patient tolerated treatment well Patient left: in chair;with call bell/phone within reach;with chair alarm set     Time: 0234-0252 PT Time Calculation (min): 18 min  Charges:  $Therapeutic Activity: 8-22 mins                    G Codes:      Enzo MontgomeryKaren L Anshul Meddings 04/24/2014, 3:48 PM

## 2014-04-25 ENCOUNTER — Other Ambulatory Visit: Payer: Self-pay | Admitting: *Deleted

## 2014-04-25 DIAGNOSIS — R262 Difficulty in walking, not elsewhere classified: Secondary | ICD-10-CM | POA: Diagnosis not present

## 2014-04-25 DIAGNOSIS — R609 Edema, unspecified: Secondary | ICD-10-CM | POA: Diagnosis not present

## 2014-04-25 DIAGNOSIS — R627 Adult failure to thrive: Secondary | ICD-10-CM | POA: Diagnosis not present

## 2014-04-25 DIAGNOSIS — M2550 Pain in unspecified joint: Secondary | ICD-10-CM | POA: Diagnosis not present

## 2014-04-25 DIAGNOSIS — I509 Heart failure, unspecified: Secondary | ICD-10-CM | POA: Diagnosis not present

## 2014-04-25 DIAGNOSIS — I5032 Chronic diastolic (congestive) heart failure: Secondary | ICD-10-CM | POA: Diagnosis not present

## 2014-04-25 DIAGNOSIS — F411 Generalized anxiety disorder: Secondary | ICD-10-CM | POA: Diagnosis not present

## 2014-04-25 DIAGNOSIS — M6281 Muscle weakness (generalized): Secondary | ICD-10-CM | POA: Diagnosis not present

## 2014-04-25 DIAGNOSIS — R279 Unspecified lack of coordination: Secondary | ICD-10-CM | POA: Diagnosis not present

## 2014-04-25 DIAGNOSIS — M542 Cervicalgia: Secondary | ICD-10-CM | POA: Diagnosis not present

## 2014-04-25 DIAGNOSIS — M109 Gout, unspecified: Secondary | ICD-10-CM | POA: Diagnosis not present

## 2014-04-25 DIAGNOSIS — G40909 Epilepsy, unspecified, not intractable, without status epilepticus: Secondary | ICD-10-CM | POA: Diagnosis not present

## 2014-04-25 DIAGNOSIS — M47812 Spondylosis without myelopathy or radiculopathy, cervical region: Secondary | ICD-10-CM | POA: Diagnosis not present

## 2014-04-25 DIAGNOSIS — E876 Hypokalemia: Secondary | ICD-10-CM | POA: Diagnosis not present

## 2014-04-25 DIAGNOSIS — M1990 Unspecified osteoarthritis, unspecified site: Secondary | ICD-10-CM | POA: Diagnosis not present

## 2014-04-25 DIAGNOSIS — I10 Essential (primary) hypertension: Secondary | ICD-10-CM | POA: Diagnosis not present

## 2014-04-25 DIAGNOSIS — D72829 Elevated white blood cell count, unspecified: Secondary | ICD-10-CM | POA: Diagnosis not present

## 2014-04-25 DIAGNOSIS — M6250 Muscle wasting and atrophy, not elsewhere classified, unspecified site: Secondary | ICD-10-CM | POA: Diagnosis not present

## 2014-04-25 MED ORDER — TRAMADOL HCL 50 MG PO TABS: 50.0000 mg | ORAL_TABLET | ORAL | Status: DC | PRN

## 2014-04-25 MED ORDER — DIAZEPAM 2 MG PO TABS: 5.0000 mg | ORAL_TABLET | Freq: Two times a day (BID) | ORAL | Status: DC | PRN

## 2014-04-25 MED ORDER — PREDNISONE 10 MG PO TABS: ORAL_TABLET | ORAL | Status: DC

## 2014-04-25 MED ORDER — TRAMADOL HCL 50 MG PO TABS: ORAL_TABLET | ORAL | Status: DC

## 2014-04-25 NOTE — Progress Notes (Signed)
Report called to Marisue Ivan at [facility], [phone]. IV removed, awaiting transport. Will continue to monitor.

## 2014-04-25 NOTE — Discharge Summary (Signed)
[facility]  Physician Discharge Summary   Patient ID: [name] MRN: [id number] DOB/AGE: [birth year] 80 y.o.  Admit date: 04/21/2014 Discharge date: 04/25/2014  PCP: Alice Reichert, MD  DISCHARGE DIAGNOSES:  Principal Problem:   FTT (failure to thrive) in adult Active Problems:   Edema   Hypertension   Pacemaker   Chronic diastolic CHF (congestive heart failure)   Weakness   RECOMMENDATIONS FOR OUTPATIENT FOLLOW UP: 1. Bmet in 3 days for hypokalemia  DISCHARGE CONDITION: fair  Diet recommendation: Heart Healthy  Filed Weights   04/21/14 2352 04/22/14 2120 04/23/14 2101  Weight: 80.74 kg (178 lb) 81.874 kg (180 lb 8 oz) 80.513 kg (177 lb 8 oz)    INITIAL HISTORY: [name] is a 80 y.o. female with a past medical history of chronic diastolic congestive heart failure, last transthoracic echocardiogram performed in 2012 which showed preserved ejection fraction, chronic lower tremor the edema, status post pacemaker implant in 2013. She presented with complaints of functional decline/failure to thrive. Family members reported that patient had become progressively weak over the past 2 weeks.   Consultations:  None  Procedures:  None  HOSPITAL COURSE:   Failure to thrive/functional decline  Initially thought to be secondary to dehydration. Also neck pain, arthritis might be playing a role. No other active issues identified. PT/OT was consulted and SNF recommended. Patient was initially reluctant to go to SNF but now she is agreeable. Her dose of Valium was also reduced as it could have also contributed to her FTT.  Acute Encephalopathy  Seems to be back to baseline. Unclear etiology. CT head was negative. Pain medication and Valium might have played a role in confusion. Valium dose has been reduced.  Chronic diastolic congestive heart failure  She appears to be close to her baseline volume status. Continue oral Lasix and KCL .  Neck pain  CT scan  of the cervical spine showing severe multilevel cervical spondylosis. Bulky degenerative changes at the atlantodental interval. Differential considerations include advanced osteoarthritis, rheumatoid arthritis and calcium pyrophosphate deposition disease (pseudogout). She is on steroids currently which will need to be tapered. No neurological deficits. She states that pain is improving. Continue PT/OT at SNF.  Bilateral LE edema  Stable. Continue Lasix. Doppler negative for DVT.   She is stable and ready for discharge to SNF.  PERTINENT LABS:  The results of significant diagnostics from this hospitalization (including imaging, microbiology, ancillary and laboratory) are listed below for reference.    Labs: Basic Metabolic Panel:  Recent Labs Lab 04/21/14 1730 04/21/14 1809 04/22/14 0210 04/24/14 0510  NA 137 138 137 140  K 4.0 3.8 3.5* 3.3*  CL 95* 95* 95* 99  CO2 26  --  27 27  GLUCOSE 108* 112* 107* 139*  BUN 28* 27* 23 24*  CREATININE 0.96 1.10 0.86 0.77  CALCIUM 10.4  --  9.6 9.5   Liver Function Tests:  Recent Labs Lab 04/21/14 1742  AST 21  ALT 6  ALKPHOS 106  BILITOT 0.4  PROT 8.2  ALBUMIN 3.3*    Recent Labs Lab 04/21/14 1742  LIPASE 15   CBC:  Recent Labs Lab 04/21/14 1730 04/21/14 1809 04/22/14 0210 04/24/14 0510  WBC 9.0  --  8.8 10.1  HGB 12.2 13.3 11.1* 11.6*  HCT 35.0* 39.0 33.3* 33.5*  MCV 86.4  --  86.4 85.9  PLT 336  --  325 403*   BNP: BNP (last 3 results)  Recent Labs  04/21/14 1742  PROBNP 1164.0*   CBG:  Recent Labs Lab 04/21/14 1752  GLUCAP 103*     IMAGING STUDIES Dg Chest 2 View  04/21/2014   CLINICAL DATA:  Shortness of breath  EXAM: CHEST  2 VIEW  COMPARISON:  12/10/2011  FINDINGS: Stable cardiomegaly and aortic tortuosity. Dual-chamber pacer from the left has unchanged lead orientation given differences in projection.  Chronic elevation of the right diaphragm, likely related to eventration. Minimal atelectasis  at the bases. No edema, pneumonia, significant effusion, or pneumothorax. Severe bilateral glenohumeral osteoarthritis.  IMPRESSION: Mild atelectasis at the bases.  No evidence of pneumonia or edema.   Electronically Signed   By: Tiburcio Pea M.D.   On: 04/21/2014 21:17   Ct Head Wo Contrast  04/21/2014   CLINICAL DATA:  Weakness  EXAM: CT HEAD WITHOUT CONTRAST  CT CERVICAL SPINE WITHOUT CONTRAST  TECHNIQUE: Multidetector CT imaging of the head and cervical spine was performed following the standard protocol without intravenous contrast. Multiplanar CT image reconstructions of the cervical spine were also generated.  COMPARISON:  None.  FINDINGS: CT HEAD FINDINGS  Negative for acute intracranial hemorrhage, acute infarction, mass, mass effect, hydrocephalus or midline shift. Gray-white differentiation is preserved throughout. No focal soft tissue or calvarial abnormality. Bilateral globes and orbits are symmetric and unremarkable. Normal aeration of the mastoid air cells and visualized paranasal sinuses. Trace atherosclerotic calcification in the bilateral cavernous carotid arteries.  CT CERVICAL SPINE FINDINGS  No acute fracture, malalignment or prevertebral soft tissue swelling. Severe multilevel cervical spondylosis with near-total loss of disc space height at C3-C4, C4-C5, C5-C6 and C6-C7. Posterior disc osteophyte complexes result in moderate to severe narrowing at C5-C6. The there is some minimal (1- 2 mm) anterolisthesis of C2 on C3 which is likely degenerative in etiology. Mild reversal the normal cervical lordosis. Bulky degenerative changes present at the atlantodental interval. Partial ossification of the main the ligamentum flavum. Multilevel facet arthropathy most severe at C2-C3. Unremarkable CT appearance of the thyroid gland. No acute soft tissue abnormality. The lung apices are unremarkable. A 9 mm hypoattenuating nodule in the right thyroid gland is nonspecific by CT. No further followup is  warranted for this sub cm nodule.  IMPRESSION: CT HEAD  1. No acute intracranial abnormality. 2. Mild intracranial atherosclerotic calcifications. CT CSPINE  1. No acute fracture or malalignment. 2. Severe multilevel cervical spondylosis with moderate to advanced central stenosis at C5-C6, multilevel facet arthropathy most severe at C2-C3 and degenerative reversal of the normal cervical lordosis. 3. Partial ossification of the ligamentum flavum. 4. Bulky degenerative changes at the atlantodental interval. Differential considerations include advanced osteoarthritis, rheumatoid arthritis and calcium pyrophosphate deposition disease (pseudogout).   Electronically Signed   By: Malachy Moan M.D.   On: 04/21/2014 20:13   Ct Cervical Spine Wo Contrast  04/21/2014   CLINICAL DATA:  Weakness  EXAM: CT HEAD WITHOUT CONTRAST  CT CERVICAL SPINE WITHOUT CONTRAST  TECHNIQUE: Multidetector CT imaging of the head and cervical spine was performed following the standard protocol without intravenous contrast. Multiplanar CT image reconstructions of the cervical spine were also generated.  COMPARISON:  None.  FINDINGS: CT HEAD FINDINGS  Negative for acute intracranial hemorrhage, acute infarction, mass, mass effect, hydrocephalus or midline shift. Gray-white differentiation is preserved throughout. No focal soft tissue or calvarial abnormality. Bilateral globes and orbits are symmetric and unremarkable. Normal aeration of the mastoid air cells and visualized paranasal sinuses. Trace atherosclerotic calcification in the bilateral cavernous carotid arteries.  CT CERVICAL SPINE FINDINGS  No acute fracture, malalignment or prevertebral soft tissue swelling. Severe multilevel cervical spondylosis with near-total loss of disc space height at C3-C4, C4-C5, C5-C6 and C6-C7. Posterior disc osteophyte complexes result in moderate to severe narrowing at C5-C6. The there is some minimal (1- 2 mm) anterolisthesis of C2 on C3 which is likely  degenerative in etiology. Mild reversal the normal cervical lordosis. Bulky degenerative changes present at the atlantodental interval. Partial ossification of the main the ligamentum flavum. Multilevel facet arthropathy most severe at C2-C3. Unremarkable CT appearance of the thyroid gland. No acute soft tissue abnormality. The lung apices are unremarkable. A 9 mm hypoattenuating nodule in the right thyroid gland is nonspecific by CT. No further followup is warranted for this sub cm nodule.  IMPRESSION: CT HEAD  1. No acute intracranial abnormality. 2. Mild intracranial atherosclerotic calcifications. CT CSPINE  1. No acute fracture or malalignment. 2. Severe multilevel cervical spondylosis with moderate to advanced central stenosis at C5-C6, multilevel facet arthropathy most severe at C2-C3 and degenerative reversal of the normal cervical lordosis. 3. Partial ossification of the ligamentum flavum. 4. Bulky degenerative changes at the atlantodental interval. Differential considerations include advanced osteoarthritis, rheumatoid arthritis and calcium pyrophosphate deposition disease (pseudogout).   Electronically Signed   By: Malachy MoanHeath  McCullough M.D.   On: 04/21/2014 20:13    DISCHARGE EXAMINATION: Filed Vitals:   04/24/14 [id number] 04/24/14 0851 04/24/14 2046 04/25/14 0429  BP: 132/71 138/66 152/78 136/56  Pulse: 80 80 99 84  Temp: 97.8 F (36.6 C) 98.4 F (36.9 C) 98 F (36.7 C) 97.5 F (36.4 C)  TempSrc: Oral Oral Oral Oral  Resp: 20 20 18 18   Height:      Weight:      SpO2: 95% 95% 96% 92%   General appearance: alert, cooperative, appears stated age and no distress Resp: clear to auscultation bilaterally Cardio: regular rate and rhythm, S1, S2 normal, no murmur, click, rub or gallop GI: soft, non-tender; bowel sounds normal; no masses,  no organomegaly Neurologic: No focal deficits  DISPOSITION: SNF  Discharge Instructions   Diet - low sodium heart healthy    Complete by:  As directed       Discharge instructions    Complete by:  As directed   Bmet in 3 days for hypokalemia     Increase activity slowly    Complete by:  As directed            ALLERGIES:  Allergies  Allergen Reactions  . Aspirin     REACTION: rash    Current Discharge Medication List    START taking these medications   Details  predniSONE (DELTASONE) 10 MG tablet Take 4 tablets once daily for 3 days, then take 3 tablets once daily for 3 days, then take 2 tablets once daily for 3 days, then take 1 tablet once daily for 3 days, then stop      CONTINUE these medications which have CHANGED   Details  diazepam (VALIUM) 2 MG tablet Take 2.5 tablets (5 mg total) by mouth 2 (two) times daily as needed for anxiety. For anxiety. Qty: 30 tablet, Refills: 0    metolazone (ZAROXOLYN) 2.5 MG tablet Take 1 tablet (2.5 mg total) by mouth daily as needed. Take if she gains excessive fluid or edema despite taking lasix.    traMADol (ULTRAM) 50 MG tablet Take 1-2 tablets (50-100 mg total) by mouth every 4 (four) hours as needed for moderate pain. Qty: 30 tablet, Refills: 0  CONTINUE these medications which have NOT CHANGED   Details  Cholecalciferol (VITAMIN D3) 1000 UNITS CAPS Take 1 capsule by mouth daily.      cyclobenzaprine (FLEXERIL) 5 MG tablet Take 5-10 mg by mouth 2 (two) times daily as needed. For muscle spasm.    furosemide (LASIX) 80 MG tablet TAKE ONE TABLET BY MOUTH TWICE DAILY Qty: 60 tablet, Refills: 6    magnesium oxide (MAG-OX) 400 MG tablet Take 1 tablet (400 mg total) by mouth daily. Qty: 30 tablet, Refills: 6    meloxicam (MOBIC) 15 MG tablet Take 15 mg by mouth daily.     Multiple Vitamins-Minerals (MULTIVITAMINS THER. W/MINERALS) TABS Take 1 tablet by mouth daily.      potassium chloride SA (K-DUR,KLOR-CON) 20 MEQ tablet TAKE 3 TABLETS IN THE AM, 3 TABLETS AT NOON, AND 2 AT BEDTIME. TAKE 3 TABS 3 TIMES DAILY WHEN TAKING ZAROXOLYN       Follow-up Information   Follow up with  Alice Reichert, MD. Schedule an appointment as soon as possible for a visit in 1 week. (post hospitalization follow up)    Specialty:  Family Medicine   Contact information:   [address] [place] [address] [phone]       TOTAL DISCHARGE TIME: 35 mins  Osvaldo Shipper  [facility] Pager [phone]  04/25/2014, 11:01 AM  Disclaimer: This note was dictated with voice recognition software. Similar sounding words can inadvertently be transcribed and may not be corrected upon review.

## 2014-04-25 NOTE — Telephone Encounter (Signed)
[facility] 

## 2014-04-25 NOTE — Care Management Note (Signed)
CARE MANAGEMENT NOTE 04/25/2014  Patient:  [name], [name]   Account Number:  [id number]  Date Initiated:  04/22/2014  Documentation initiated by:  Darlyne Russian  Subjective/Objective Assessment:   admitted with increased weakness and confusion     Action/Plan:   progression of care and discharge planning   Anticipated DC Date:  04/25/2014   Anticipated DC Plan:  SKILLED NURSING FACILITY         Choice offered to / List presented to:             Status of service:  Completed, signed off Medicare Important Message given?  YES (If response is "NO", the following Medicare IM given date fields will be blank) Date Medicare IM given:  04/25/2014 Date Additional Medicare IM given:    Discharge Disposition:  SKILLED NURSING FACILITY  Per UR Regulation:    If discussed at Long Length of Stay Meetings, dates discussed:    Comments:

## 2014-04-28 ENCOUNTER — Encounter: Payer: Self-pay | Admitting: Adult Health

## 2014-04-28 ENCOUNTER — Non-Acute Institutional Stay (SKILLED_NURSING_FACILITY): Payer: Medicare Other | Admitting: Adult Health

## 2014-04-28 ENCOUNTER — Encounter: Payer: Self-pay | Admitting: *Deleted

## 2014-04-28 DIAGNOSIS — F419 Anxiety disorder, unspecified: Secondary | ICD-10-CM | POA: Insufficient documentation

## 2014-04-28 DIAGNOSIS — I5032 Chronic diastolic (congestive) heart failure: Secondary | ICD-10-CM

## 2014-04-28 DIAGNOSIS — R609 Edema, unspecified: Secondary | ICD-10-CM

## 2014-04-28 DIAGNOSIS — E876 Hypokalemia: Secondary | ICD-10-CM

## 2014-04-28 DIAGNOSIS — R627 Adult failure to thrive: Secondary | ICD-10-CM | POA: Diagnosis not present

## 2014-04-28 DIAGNOSIS — R5383 Other fatigue: Secondary | ICD-10-CM

## 2014-04-28 DIAGNOSIS — M542 Cervicalgia: Secondary | ICD-10-CM

## 2014-04-28 DIAGNOSIS — F411 Generalized anxiety disorder: Secondary | ICD-10-CM

## 2014-04-28 DIAGNOSIS — R5381 Other malaise: Secondary | ICD-10-CM

## 2014-04-28 DIAGNOSIS — I509 Heart failure, unspecified: Secondary | ICD-10-CM

## 2014-04-28 DIAGNOSIS — R531 Weakness: Secondary | ICD-10-CM

## 2014-04-28 NOTE — Progress Notes (Signed)
Patient ID: [name], female   DOB: 1931/05/08, 80 y.o.   MRN: [id number]               PROGRESS NOTE  DATE: 04/28/2014  FACILITY: Nursing Home Location: [facility]  LEVEL OF CARE: SNF (31)  Acute Visit  CHIEF COMPLAINT:  Follow-up Hospitalization  HISTORY OF PRESENT ILLNESS: This is an 80 year old female who has been admitted to [facility] on 04/25/14 from [facility] with Failure to thrive. She has been admitted for a short-term rehabilitation.  REASSESSMENT OF ONGOING PROBLEM(S):  CHF:The patient does not relate significant weight changes, denies sob, DOE, orthopnea, PNDs, pedal edema, palpitations or chest pain.  CHF remains stable.  No complications form the medications being used.  HYPOKALEMIA: The patient's hypokalemia remains stable. Patient denies muscle cramping or palpitations. No complications reported from current potassium supplementation. 6/15 K 3.3  ANXIETY: The anxiety remains stable. Patient denies ongoing anxiety or irritability. No complications reported from the medications currently being used.  PAST MEDICAL HISTORY : Reviewed.  No changes/see problem list  CURRENT MEDICATIONS: Reviewed per MAR/see medication list  REVIEW OF SYSTEMS:  GENERAL: no change in appetite, no fatigue, no weight changes, no fever, chills or weakness RESPIRATORY: no cough, SOB, DOE, wheezing, hemoptysis CARDIAC: no chest pain, or palpitations, +edema GI: no abdominal pain, diarrhea, constipation, heart burn, nausea or vomiting  PHYSICAL EXAMINATION  GENERAL: no acute distress, normal body habitus EYES: conjunctivae normal, sclerae normal, normal eye lids NECK: supple, trachea midline, no neck masses, no thyroid tenderness, no thyromegaly LYMPHATICS: no LAN in the neck, no supraclavicular LAN RESPIRATORY: breathing is even & unlabored, BS CTAB CARDIAC: RRR, no murmur,no extra heart sounds, BLE edema 2+, + pacemaker on left chest GI: abdomen  soft, normal BS, no masses, no tenderness, no hepatomegaly, no splenomegaly EXTREMITIES: able to move all 4 extremities; able to stand and pivot during transfers PSYCHIATRIC: the patient is alert & oriented to person, affect & behavior appropriate  LABS/RADIOLOGY: Labs reviewed: Basic Metabolic Panel:  Recent Labs  31/43/88 1730 04/21/14 1809 04/22/14 0210 04/24/14 0510  NA 137 138 137 140  K 4.0 3.8 3.5* 3.3*  CL 95* 95* 95* 99  CO2 26  --  27 27  GLUCOSE 108* 112* 107* 139*  BUN 28* 27* 23 24*  CREATININE 0.96 1.10 0.86 0.77  CALCIUM 10.4  --  9.6 9.5   Liver Function Tests:  Recent Labs  04/21/14 1742  AST 21  ALT 6  ALKPHOS 106  BILITOT 0.4  PROT 8.2  ALBUMIN 3.3*    Recent Labs  04/21/14 1742  LIPASE 15    CBC:  Recent Labs  04/21/14 1730 04/21/14 1809 04/22/14 0210 04/24/14 0510  WBC 9.0  --  8.8 10.1  HGB 12.2 13.3 11.1* 11.6*  HCT 35.0* 39.0 33.3* 33.5*  MCV 86.4  --  86.4 85.9  PLT 336  --  325 403*   CBG:  Recent Labs  04/21/14 1752  GLUCAP 103*    ASSESSMENT/PLAN:  Failure to thrive, in adult - continue supplementation Weakness - for rehabilitation Chronic diastolic CHF - stable; continue Lasix and Zaroxolyn Neck Pain - continue Prednisone tapering dose and Mobic Anxiety - stable; continue Valium PRN Edema of BLE - continue Lasix Hypokalemia - for BMP  CPT CODE: 87579  Ella Bodo - NP [facility] [phone]

## 2014-04-29 ENCOUNTER — Non-Acute Institutional Stay (SKILLED_NURSING_FACILITY): Payer: Medicare Other | Admitting: Internal Medicine

## 2014-04-29 DIAGNOSIS — I5032 Chronic diastolic (congestive) heart failure: Secondary | ICD-10-CM

## 2014-04-29 DIAGNOSIS — M1990 Unspecified osteoarthritis, unspecified site: Secondary | ICD-10-CM | POA: Diagnosis not present

## 2014-04-29 DIAGNOSIS — I509 Heart failure, unspecified: Secondary | ICD-10-CM

## 2014-04-29 DIAGNOSIS — I10 Essential (primary) hypertension: Secondary | ICD-10-CM | POA: Diagnosis not present

## 2014-04-29 DIAGNOSIS — E876 Hypokalemia: Secondary | ICD-10-CM | POA: Diagnosis not present

## 2014-04-29 NOTE — Progress Notes (Signed)
HISTORY & PHYSICAL  DATE: 04/29/2014   FACILITY: [facility]  LEVEL OF CARE: SNF (31)  ALLERGIES:  Allergies  Allergen Reactions  . Aspirin     REACTION: rash    CHIEF COMPLAINT:  Manage CHF, hypertension and arthritis  HISTORY OF PRESENT ILLNESS: 80 year old African American female was hospitalized secondary to failure to thrive. After hospitalization patient is admitted to this facility for short-term rehabilitation.  ARTHRITIS: Patient's arthritis remains stable.  The patient denies ongoing joint pains, stiffness, swelling, warmth & redness.  No complications reported from the medication(s) currently being used.  CHF:The patient does not relate significant weight changes, denies sob, DOE, orthopnea, PNDs, palpitations or chest pain.  CHF remains stable.  No complications form the medications being used. Complains of chronic lower extremity swelling.  HTN: Pt 's HTN remains stable.  Denies CP, sob, DOE, headaches, dizziness or visual disturbances.  No complications from the medications currently being used.  Last BP : 155/85.  PAST MEDICAL HISTORY :  Past Medical History  Diagnosis Date  . Edema     . EF 65-70%, echo, November 29, 2010  . Pulmonary hypertension      46 mmHg... echo... January, 2012  . Cellulitis     Superficial cellulitis right lower leg   January 14, 2011  . AV block      2:1  January 14, 2011 / pacemaker plan when cellulitis resolved in length  . Bradycardia     2:1  AV block  . Hypokalemia     October, 2012  . Ejection fraction     EF 65%, echo, January, 2012  . Murmur     No significant valvular disease by echo, January, 2012  . Pacemaker     January, 2013  . Chronic diastolic CHF (congestive heart failure)   . Hypertension     PAST SURGICAL HISTORY: Past Surgical History  Procedure Laterality Date  . Partial hysterectomy    . Hip arthroplasty      total    SOCIAL HISTORY:  reports that she has never  smoked. She does not have any smokeless tobacco history on file. She reports that she does not drink alcohol or use illicit drugs.  FAMILY HISTORY:  Family History  Problem Relation Age of Onset  . Cardiomyopathy Father   . Diabetes Father   . Cancer       son  . Coronary artery disease      son    CURRENT MEDICATIONS: Reviewed per MAR/see medication list  REVIEW OF SYSTEMS:  See HPI otherwise 14 point ROS is negative.  PHYSICAL EXAMINATION  VS:  See VS section  GENERAL: no acute distress, morbidly obese body habitus EYES: conjunctivae normal, sclerae normal, normal eye lids MOUTH/THROAT: lips without lesions,no lesions in the mouth,tongue is without lesions,uvula elevates in midline NECK: supple, trachea midline, no neck masses, no thyroid tenderness, no thyromegaly LYMPHATICS: no LAN in the neck, no supraclavicular LAN RESPIRATORY: breathing is even & unlabored, BS CTAB CARDIAC: RRR, no murmur,no extra heart sounds, +2 bilateral lower extremity edema GI:  ABDOMEN: abdomen soft, normal BS, no masses, no tenderness  LIVER/SPLEEN: no hepatomegaly, no splenomegaly MUSCULOSKELETAL: HEAD: normal to inspection  EXTREMITIES: LEFT UPPER EXTREMITY: Minimal range of motion, decreased strength & tone RIGHT UPPER EXTREMITY:  Minimal  range of motion, decreased strength & tone LEFT LOWER EXTREMITY: minimal range of motion, decreased strength & tone RIGHT LOWER EXTREMITY: minimal range of  motion, decreased strength & tone PSYCHIATRIC: the patient is alert & oriented to person, affect & behavior appropriate  LABS/RADIOLOGY:  Labs reviewed: Basic Metabolic Panel:  Recent Labs  16/08/9605/01/15 1730 04/21/14 1809 04/22/14 0210 04/24/14 0510  NA 137 138 137 140  K 4.0 3.8 3.5* 3.3*  CL 95* 95* 95* 99  CO2 26  --  27 27  GLUCOSE 108* 112* 107* 139*  BUN 28* 27* 23 24*  CREATININE 0.96 1.10 0.86 0.77  CALCIUM 10.4  --  9.6 9.5   Liver Function Tests:  Recent Labs  04/21/14 1742    AST 21  ALT 6  ALKPHOS 106  BILITOT 0.4  PROT 8.2  ALBUMIN 3.3*    Recent Labs  04/21/14 1742  LIPASE 15   CBC:  Recent Labs  04/21/14 1730 04/21/14 1809 04/22/14 0210 04/24/14 0510  WBC 9.0  --  8.8 10.1  HGB 12.2 13.3 11.1* 11.6*  HCT 35.0* 39.0 33.3* 33.5*  MCV 86.4  --  86.4 85.9  PLT 336  --  325 403*   CBG:  Recent Labs  04/21/14 1752  GLUCAP 103*    CT HEAD WITHOUT CONTRAST   CT CERVICAL SPINE WITHOUT CONTRAST   TECHNIQUE: Multidetector CT imaging of the head and cervical spine was performed following the standard protocol without intravenous contrast. Multiplanar CT image reconstructions of the cervical spine were also generated.   COMPARISON:  None.   FINDINGS: CT HEAD FINDINGS   Negative for acute intracranial hemorrhage, acute infarction, mass, mass effect, hydrocephalus or midline shift. Gray-white differentiation is preserved throughout. No focal soft tissue or calvarial abnormality. Bilateral globes and orbits are symmetric and unremarkable. Normal aeration of the mastoid air cells and visualized paranasal sinuses. Trace atherosclerotic calcification in the bilateral cavernous carotid arteries.   CT CERVICAL SPINE FINDINGS   No acute fracture, malalignment or prevertebral soft tissue swelling. Severe multilevel cervical spondylosis with near-total loss of disc space height at C3-C4, C4-C5, C5-C6 and C6-C7. Posterior disc osteophyte complexes result in moderate to severe narrowing at C5-C6. The there is some minimal (1- 2 mm) anterolisthesis of C2 on C3 which is likely degenerative in etiology. Mild reversal the normal cervical lordosis. Bulky degenerative changes present at the atlantodental interval. Partial ossification of the main the ligamentum flavum. Multilevel facet arthropathy most severe at C2-C3. Unremarkable CT appearance of the thyroid gland. No acute soft tissue abnormality. The lung apices are unremarkable. A 9 mm  hypoattenuating nodule in the right thyroid gland is nonspecific by CT. No further followup is warranted for this sub cm nodule.   IMPRESSION: CT HEAD   1. No acute intracranial abnormality. 2. Mild intracranial atherosclerotic calcifications. CT CSPINE   1. No acute fracture or malalignment. 2. Severe multilevel cervical spondylosis with moderate to advanced central stenosis at C5-C6, multilevel facet arthropathy most severe at C2-C3 and degenerative reversal of the normal cervical lordosis. 3. Partial ossification of the ligamentum flavum. 4. Bulky degenerative changes at the atlantodental interval. Differential considerations include advanced osteoarthritis, rheumatoid arthritis and calcium pyrophosphate deposition disease (pseudogout).     CHEST  2 VIEW   COMPARISON:  12/10/2011   FINDINGS: Stable cardiomegaly and aortic tortuosity. Dual-chamber pacer from the left has unchanged lead orientation given differences in projection.   Chronic elevation of the right diaphragm, likely related to eventration. Minimal atelectasis at the bases. No edema, pneumonia, significant effusion, or pneumothorax. Severe bilateral glenohumeral osteoarthritis.   IMPRESSION: Mild atelectasis at the bases.  No evidence  of pneumonia or edema   ASSESSMENT/PLAN:  CHF-compensated Arthritis-denies current pain Hypertension-blood pressure elevated. Will monitor. Hypokalemia-recheck Hyperglycemia-recheck Anemia-check hemoglobin level Check CBC and BMP  I have reviewed patient's medical records received at admission/from hospitalization.  CPT CODE: 56213  Angela Cox, MD [facility] [phone]

## 2014-05-02 NOTE — Telephone Encounter (Signed)
**Note De-Identified [name] Obfuscation** 5/22 Weight= 171 lbs.  The pt states that the edema in her left leg from the knee to toes is better and that she has no SOB at this time. 5/27 Weight= 171 lbs.  The pt states that the edema in her left leg from the knee to toes is better and that she has no SOB at this time. 5/28 Weight=?  The pt states that her aid is not there yet so she does not know her weight today. She states that she has moderate edema in her left leg and right calf and no SOB at this time.  5/29  Weight?  No answer. 6/1  Weight= ? The pt seems some what confused today and is just mumbling over the phone. I asked if her aid was there and [name] said she was and put her on the phone. The aid states that [name] "is not doing well" and that she was falling asleep while on the phone with me. She states that the pt is too weak to get up to weigh and has not been able to for the past couple of days and that her legs are swollen "pretty bad this morning". The aid suggested that I call the pts daughter, [name], as she was with the pt all of last weekend. [name] states that she begged the pt to go to the ER all last weekend due to her fatigue and weakness but the pt refused. Per Dr Phillis Haggis is advised that the pt does need to go to the ER or urgent care to be evaluated. She stated that she would be own her way to get her when we got off the phone.  [name] called me back later and stated that she called 911 to take her mother to the ER as [name] does not think the pt will go with her. 6/5  The pt has been discharged from [facility] and is now at [facility].

## 2014-05-08 ENCOUNTER — Non-Acute Institutional Stay (SKILLED_NURSING_FACILITY): Payer: Medicare Other | Admitting: Adult Health

## 2014-05-08 ENCOUNTER — Encounter: Payer: Self-pay | Admitting: Adult Health

## 2014-05-08 DIAGNOSIS — E876 Hypokalemia: Secondary | ICD-10-CM | POA: Diagnosis not present

## 2014-05-08 DIAGNOSIS — I5032 Chronic diastolic (congestive) heart failure: Secondary | ICD-10-CM

## 2014-05-08 DIAGNOSIS — R531 Weakness: Secondary | ICD-10-CM

## 2014-05-08 DIAGNOSIS — R627 Adult failure to thrive: Secondary | ICD-10-CM | POA: Diagnosis not present

## 2014-05-08 DIAGNOSIS — R609 Edema, unspecified: Secondary | ICD-10-CM

## 2014-05-08 DIAGNOSIS — R5383 Other fatigue: Secondary | ICD-10-CM

## 2014-05-08 DIAGNOSIS — F419 Anxiety disorder, unspecified: Secondary | ICD-10-CM

## 2014-05-08 DIAGNOSIS — F411 Generalized anxiety disorder: Secondary | ICD-10-CM

## 2014-05-08 DIAGNOSIS — I509 Heart failure, unspecified: Secondary | ICD-10-CM

## 2014-05-08 DIAGNOSIS — I150 Renovascular hypertension: Secondary | ICD-10-CM

## 2014-05-08 DIAGNOSIS — R5381 Other malaise: Secondary | ICD-10-CM

## 2014-05-08 DIAGNOSIS — M542 Cervicalgia: Secondary | ICD-10-CM

## 2014-05-08 NOTE — Progress Notes (Signed)
Patient ID: MEGEN SHECKLER, female   DOB: 1931/02/16, 80 y.o.   MRN: [id number]               PROGRESS NOTE  DATE: 05/08/14  FACILITY: Nursing Home Location: [facility]  LEVEL OF CARE: SNF (31)  Acute Visit  CHIEF COMPLAINT:  Discharge Notes  HISTORY OF PRESENT ILLNESS: This is an 80 year old female who is for discharge home with Home health PT, OT and Nursing. DME: 3-in-1 bedside commode. She has been admitted to [facility] on 04/25/14 from [facility] with Failure to thrive. Patient was admitted to this facility for short-term rehabilitation after the patient's recent hospitalization. Patient has completed SNF rehabilitation and therapy has cleared the patient for discharge.   REASSESSMENT OF ONGOING PROBLEM(S):  HTN: Pt 's HTN remains stable.  Denies CP, sob, DOE, pedal edema, headaches, dizziness or visual disturbances.  No complications from the medications currently being used.  Last BP : 137/72  CHF:The patient does not relate significant weight changes, denies sob, DOE, orthopnea, PNDs, pedal edema, palpitations or chest pain.  CHF remains stable.  No complications form the medications being used.  FTT: The failure to thrive remains stable.  No complications reported from the medication(s) currently being used.  Last weight: 167.8 lbs   PAST MEDICAL HISTORY : Reviewed.  No changes/see problem list  CURRENT MEDICATIONS: Reviewed per MAR/see medication list  REVIEW OF SYSTEMS:  GENERAL: no change in appetite, no fatigue, no weight changes, no fever, chills or weakness RESPIRATORY: no cough, SOB, DOE, wheezing, hemoptysis CARDIAC: no chest pain, or palpitations, +edema GI: no abdominal pain, diarrhea, constipation, heart burn, nausea or vomiting  PHYSICAL EXAMINATION  GENERAL: no acute distress, normal body habitus EYES: conjunctivae normal, sclerae normal, normal eye lids NECK: supple, trachea midline, no neck masses, no thyroid tenderness,  no thyromegaly RESPIRATORY: breathing is even & unlabored, BS CTAB CARDIAC: RRR, no murmur,no extra heart sounds, BLE edema 2+, + pacemaker on left chest GI: abdomen soft, normal BS, no masses, no tenderness, no hepatomegaly, no splenomegaly EXTREMITIES: able to move all 4 extremities; ambulates with walker PSYCHIATRIC: the patient is alert & oriented to person, affect & behavior appropriate  LABS/RADIOLOGY: 04/29/14  NA 136 K 4.4  Glucose 92  BUN 46  Creatinine 1.1  CA 10.2  Mg 2.0   Wbc 14.4  hgb 13.5  hct 42.1 04/28/14  NA 134  K 4.1  Glucose 92  BUN 44  Creatinine 1.1  CA 9.7 Labs reviewed: Basic Metabolic Panel:  Recent Labs  78/67/67 1730 04/21/14 1809 04/22/14 0210 04/24/14 0510  NA 137 138 137 140  K 4.0 3.8 3.5* 3.3*  CL 95* 95* 95* 99  CO2 26  --  27 27  GLUCOSE 108* 112* 107* 139*  BUN 28* 27* 23 24*  CREATININE 0.96 1.10 0.86 0.77  CALCIUM 10.4  --  9.6 9.5   Liver Function Tests:  Recent Labs  04/21/14 1742  AST 21  ALT 6  ALKPHOS 106  BILITOT 0.4  PROT 8.2  ALBUMIN 3.3*    Recent Labs  04/21/14 1742  LIPASE 15    CBC:  Recent Labs  04/21/14 1730 04/21/14 1809 04/22/14 0210 04/24/14 0510  WBC 9.0  --  8.8 10.1  HGB 12.2 13.3 11.1* 11.6*  HCT 35.0* 39.0 33.3* 33.5*  MCV 86.4  --  86.4 85.9  PLT 336  --  325 403*   CBG:  Recent Labs  04/21/14 1752  GLUCAP 103*    ASSESSMENT/PLAN:  Failure to thrive, in adult - continue supplementation Weakness - for Home health PT, OT and Nursing Chronic diastolic CHF - stable; continue Lasix and Zaroxolyn Neck Pain - continue  Mobic Anxiety - stable; continue Valium PRN Edema of BLE - continue Lasix Hypokalemia - continue supplementation   I have filled out patient's discharge paperwork and written prescriptions.  Patient will receive home health PT, OT and Nursing.   DME provided: 3-in-1 bedside commode  Total discharge time: Greater than 30 minutes  Discharge time involved coordination  of the discharge process with social worker, nursing staff and therapy department. Medical justification for home health services/DME verified.   CPT CODE: [id number]  Ella BodoMonina Vargas - NP [facility] [phone]

## 2014-05-12 ENCOUNTER — Telehealth: Payer: Self-pay | Admitting: Cardiology

## 2014-05-12 NOTE — Telephone Encounter (Signed)
New message     Pt is out of rehab and is back home.  Nurse can start monitoring her weight again

## 2014-05-12 NOTE — Telephone Encounter (Signed)
Daughter is calling to let [name], Dr. Myrtis Ser 's nurse know that her mother is back at home from rehab.  She states that [name] called her every day to monitor her weight. Advised her that [name] not in office today but will forward message to her.

## 2014-05-13 NOTE — Telephone Encounter (Signed)
CHF pt: I spoke with the pt over the phone today and she states that she is doing much better. She states that she has mild edema in her ankles and no SOB at this time.

## 2014-05-15 DIAGNOSIS — I10 Essential (primary) hypertension: Secondary | ICD-10-CM | POA: Diagnosis not present

## 2014-05-15 DIAGNOSIS — R627 Adult failure to thrive: Secondary | ICD-10-CM | POA: Diagnosis not present

## 2014-05-15 DIAGNOSIS — I509 Heart failure, unspecified: Secondary | ICD-10-CM | POA: Diagnosis not present

## 2014-05-15 DIAGNOSIS — I2789 Other specified pulmonary heart diseases: Secondary | ICD-10-CM | POA: Diagnosis not present

## 2014-05-17 DIAGNOSIS — I10 Essential (primary) hypertension: Secondary | ICD-10-CM | POA: Diagnosis not present

## 2014-05-17 DIAGNOSIS — I2789 Other specified pulmonary heart diseases: Secondary | ICD-10-CM | POA: Diagnosis not present

## 2014-05-17 DIAGNOSIS — R627 Adult failure to thrive: Secondary | ICD-10-CM | POA: Diagnosis not present

## 2014-05-17 DIAGNOSIS — I509 Heart failure, unspecified: Secondary | ICD-10-CM | POA: Diagnosis not present

## 2014-05-20 ENCOUNTER — Telehealth: Payer: Self-pay

## 2014-05-20 DIAGNOSIS — I2789 Other specified pulmonary heart diseases: Secondary | ICD-10-CM | POA: Diagnosis not present

## 2014-05-20 DIAGNOSIS — R627 Adult failure to thrive: Secondary | ICD-10-CM | POA: Diagnosis not present

## 2014-05-20 DIAGNOSIS — I10 Essential (primary) hypertension: Secondary | ICD-10-CM | POA: Diagnosis not present

## 2014-05-20 DIAGNOSIS — I509 Heart failure, unspecified: Secondary | ICD-10-CM | POA: Diagnosis not present

## 2014-05-20 NOTE — Telephone Encounter (Signed)
05/09/14 The pt released from nursing home. 05/13/14 Weight= 168 lbs.  The pt states that she has mild edema in her ankles and no SOB at this time. 05/14/14  Weight= 171 lbs.  The pt states that she has mild edema in her left ankle and no SOB at this time. 05/16/14  Weight= 170 lbs. The pt reports mild edema in her left foot and ankle and no SOB at this time. 05/19/14  Weight= 171 lbs. The pt reports mild edema in her left foot and ankle and no SOB at this time.

## 2014-05-21 NOTE — Telephone Encounter (Signed)
**Note De-Identified [name] Obfuscation** 05/20/14  Weight= 172 lbs. The pt states that the edema in her left ankle is unchanged and that she has no SOB at this time. The pt took Zaroxolyn dose today due to weight of 172 lbs.

## 2014-05-22 DIAGNOSIS — I2789 Other specified pulmonary heart diseases: Secondary | ICD-10-CM | POA: Diagnosis not present

## 2014-05-22 DIAGNOSIS — I10 Essential (primary) hypertension: Secondary | ICD-10-CM | POA: Diagnosis not present

## 2014-05-22 DIAGNOSIS — I509 Heart failure, unspecified: Secondary | ICD-10-CM | POA: Diagnosis not present

## 2014-05-22 DIAGNOSIS — R627 Adult failure to thrive: Secondary | ICD-10-CM | POA: Diagnosis not present

## 2014-05-26 DIAGNOSIS — R627 Adult failure to thrive: Secondary | ICD-10-CM | POA: Diagnosis not present

## 2014-05-26 DIAGNOSIS — I10 Essential (primary) hypertension: Secondary | ICD-10-CM | POA: Diagnosis not present

## 2014-05-26 DIAGNOSIS — I2789 Other specified pulmonary heart diseases: Secondary | ICD-10-CM | POA: Diagnosis not present

## 2014-05-26 DIAGNOSIS — I509 Heart failure, unspecified: Secondary | ICD-10-CM | POA: Diagnosis not present

## 2014-05-27 DIAGNOSIS — I509 Heart failure, unspecified: Secondary | ICD-10-CM | POA: Diagnosis not present

## 2014-05-27 DIAGNOSIS — I10 Essential (primary) hypertension: Secondary | ICD-10-CM | POA: Diagnosis not present

## 2014-05-27 DIAGNOSIS — R627 Adult failure to thrive: Secondary | ICD-10-CM | POA: Diagnosis not present

## 2014-05-27 DIAGNOSIS — I2789 Other specified pulmonary heart diseases: Secondary | ICD-10-CM | POA: Diagnosis not present

## 2014-05-29 DIAGNOSIS — E876 Hypokalemia: Secondary | ICD-10-CM | POA: Diagnosis not present

## 2014-05-30 DIAGNOSIS — I10 Essential (primary) hypertension: Secondary | ICD-10-CM | POA: Diagnosis not present

## 2014-05-30 DIAGNOSIS — I2789 Other specified pulmonary heart diseases: Secondary | ICD-10-CM | POA: Diagnosis not present

## 2014-05-30 DIAGNOSIS — I509 Heart failure, unspecified: Secondary | ICD-10-CM | POA: Diagnosis not present

## 2014-05-30 DIAGNOSIS — R627 Adult failure to thrive: Secondary | ICD-10-CM | POA: Diagnosis not present

## 2014-06-02 DIAGNOSIS — I10 Essential (primary) hypertension: Secondary | ICD-10-CM | POA: Diagnosis not present

## 2014-06-02 DIAGNOSIS — R627 Adult failure to thrive: Secondary | ICD-10-CM | POA: Diagnosis not present

## 2014-06-02 DIAGNOSIS — I2789 Other specified pulmonary heart diseases: Secondary | ICD-10-CM | POA: Diagnosis not present

## 2014-06-02 DIAGNOSIS — I509 Heart failure, unspecified: Secondary | ICD-10-CM | POA: Diagnosis not present

## 2014-06-03 ENCOUNTER — Other Ambulatory Visit: Payer: Self-pay

## 2014-06-03 MED ORDER — METOLAZONE 2.5 MG PO TABS: 2.5000 mg | ORAL_TABLET | Freq: Every day | ORAL | Status: DC | PRN

## 2014-06-04 DIAGNOSIS — R627 Adult failure to thrive: Secondary | ICD-10-CM | POA: Diagnosis not present

## 2014-06-04 DIAGNOSIS — I2789 Other specified pulmonary heart diseases: Secondary | ICD-10-CM | POA: Diagnosis not present

## 2014-06-04 DIAGNOSIS — I509 Heart failure, unspecified: Secondary | ICD-10-CM | POA: Diagnosis not present

## 2014-06-04 DIAGNOSIS — I10 Essential (primary) hypertension: Secondary | ICD-10-CM | POA: Diagnosis not present

## 2014-06-05 ENCOUNTER — Telehealth: Payer: Self-pay

## 2014-06-05 NOTE — Telephone Encounter (Signed)
05/21/14  Weight= 170 lbs. The pt states that she has mild edema in her left ankle and no sob at this time.  05/27/14  Weight= 170 lbs.  The pt states that she has mild edema in her left foot and that she has mild sob at this time. 05/28/14  Weight= 171 lbs. The pt states that she has mild edema in her left ankle and no sob at this time.  06/03/14  Weight= 172 lbs. The pt states that the edema in her left ankle has improved and that she has no sob at this time. 06/05/14  Weight= No answer

## 2014-06-09 ENCOUNTER — Other Ambulatory Visit: Payer: Self-pay | Admitting: Internal Medicine

## 2014-06-10 DIAGNOSIS — I10 Essential (primary) hypertension: Secondary | ICD-10-CM | POA: Diagnosis not present

## 2014-06-10 DIAGNOSIS — I2789 Other specified pulmonary heart diseases: Secondary | ICD-10-CM | POA: Diagnosis not present

## 2014-06-10 DIAGNOSIS — R627 Adult failure to thrive: Secondary | ICD-10-CM | POA: Diagnosis not present

## 2014-06-10 DIAGNOSIS — I509 Heart failure, unspecified: Secondary | ICD-10-CM | POA: Diagnosis not present

## 2014-06-13 ENCOUNTER — Other Ambulatory Visit: Payer: Self-pay | Admitting: Internal Medicine

## 2014-06-13 DIAGNOSIS — I10 Essential (primary) hypertension: Secondary | ICD-10-CM | POA: Diagnosis not present

## 2014-06-13 DIAGNOSIS — R627 Adult failure to thrive: Secondary | ICD-10-CM | POA: Diagnosis not present

## 2014-06-13 DIAGNOSIS — I509 Heart failure, unspecified: Secondary | ICD-10-CM | POA: Diagnosis not present

## 2014-06-13 DIAGNOSIS — I2789 Other specified pulmonary heart diseases: Secondary | ICD-10-CM | POA: Diagnosis not present

## 2014-06-17 DIAGNOSIS — I509 Heart failure, unspecified: Secondary | ICD-10-CM | POA: Diagnosis not present

## 2014-06-17 DIAGNOSIS — I2789 Other specified pulmonary heart diseases: Secondary | ICD-10-CM | POA: Diagnosis not present

## 2014-06-17 DIAGNOSIS — R627 Adult failure to thrive: Secondary | ICD-10-CM | POA: Diagnosis not present

## 2014-06-17 DIAGNOSIS — I10 Essential (primary) hypertension: Secondary | ICD-10-CM | POA: Diagnosis not present

## 2014-06-19 DIAGNOSIS — I509 Heart failure, unspecified: Secondary | ICD-10-CM | POA: Diagnosis not present

## 2014-06-19 DIAGNOSIS — I10 Essential (primary) hypertension: Secondary | ICD-10-CM | POA: Diagnosis not present

## 2014-06-19 DIAGNOSIS — R627 Adult failure to thrive: Secondary | ICD-10-CM | POA: Diagnosis not present

## 2014-06-19 DIAGNOSIS — I2789 Other specified pulmonary heart diseases: Secondary | ICD-10-CM | POA: Diagnosis not present

## 2014-06-23 ENCOUNTER — Telehealth: Payer: Self-pay

## 2014-06-23 NOTE — Telephone Encounter (Signed)
**Note De-Identified Zamari Bonsall Obfuscation** 06/06/14  Weight= 171 lbs. The pt states that she has mild edema in her feet and no sob at this time. 06/11/14  Weight= 170 lbs.  The pt reports that her feet are better and that she has no sob at this time.  06/12/14  Weight= 172 lbs. The pt states that she has mild left ankle edema and no sob at this time. She took a Zaroxolyn dose today due to weight of 172 lbs. 06/13/14  Weight= 171 lbs.  The pt states that she has mild left ankle edema and no sob at this time. 06/16/14  Weight= 170 lbs.  The pt states that she has mild left ankle edema and no sob at this time. 06/17/14  Weight= 171 lbs.  The pt states that she has mild left ankle edema and no sob at this time. 06/19/14  Weight=  171 lbs.  The pt states that her ankles are better and that she has no sob at this time.

## 2014-06-23 NOTE — Telephone Encounter (Addendum)
error 

## 2014-06-26 ENCOUNTER — Ambulatory Visit (INDEPENDENT_AMBULATORY_CARE_PROVIDER_SITE_OTHER): Payer: Medicare Other | Admitting: *Deleted

## 2014-06-26 ENCOUNTER — Other Ambulatory Visit (INDEPENDENT_AMBULATORY_CARE_PROVIDER_SITE_OTHER): Payer: Medicare Other

## 2014-06-26 ENCOUNTER — Encounter: Payer: Self-pay | Admitting: Internal Medicine

## 2014-06-26 DIAGNOSIS — I4430 Unspecified atrioventricular block: Secondary | ICD-10-CM | POA: Diagnosis not present

## 2014-06-26 DIAGNOSIS — I10 Essential (primary) hypertension: Secondary | ICD-10-CM | POA: Diagnosis not present

## 2014-06-26 DIAGNOSIS — Z950 Presence of cardiac pacemaker: Secondary | ICD-10-CM | POA: Diagnosis not present

## 2014-06-26 LAB — BASIC METABOLIC PANEL
BUN: 27 mg/dL — ABNORMAL HIGH (ref 6–23)
CO2: 29 mEq/L (ref 19–32)
CREATININE: 1.3 mg/dL — AB (ref 0.4–1.2)
Calcium: 9.7 mg/dL (ref 8.4–10.5)
Chloride: 99 mEq/L (ref 96–112)
GFR: 50.61 mL/min — AB (ref >60.00)
Glucose, Bld: 96 mg/dL (ref 70–99)
Potassium: 4.4 mEq/L (ref 3.5–5.1)
Sodium: 137 mEq/L (ref 135–145)

## 2014-06-26 LAB — MDC_IDC_ENUM_SESS_TYPE_INCLINIC

## 2014-06-26 NOTE — Progress Notes (Signed)
Pacemaker check in clinic. Normal device function. Thresholds, sensing, impedances consistent with previous measurements. Device programmed to maximize longevity. 491 mode switches--- 0.1%, fastetst 178bpm, longest . 1 NSVT---11 beats. Device programmed at appropriate safety margins. Histogram distribution appropriate for patient activity level. Device programmed to optimize intrinsic conduction. Estimated longevity 7.70yrs. ROV w/ Dr. Ladona Ridgel in 109mo.

## 2014-07-08 DIAGNOSIS — I2789 Other specified pulmonary heart diseases: Secondary | ICD-10-CM | POA: Diagnosis not present

## 2014-07-08 DIAGNOSIS — R627 Adult failure to thrive: Secondary | ICD-10-CM | POA: Diagnosis not present

## 2014-07-08 DIAGNOSIS — I10 Essential (primary) hypertension: Secondary | ICD-10-CM | POA: Diagnosis not present

## 2014-07-08 DIAGNOSIS — I509 Heart failure, unspecified: Secondary | ICD-10-CM | POA: Diagnosis not present

## 2014-07-09 ENCOUNTER — Telehealth: Payer: Self-pay

## 2014-07-09 NOTE — Telephone Encounter (Signed)
**Note De-Identified Kervens Roper Obfuscation** 06/23/14  Weight= 170 lbs.  The pt states that she has increased edema in her left leg and ankle and that she has no SOB at this time. 06/25/14  Weight= 173 lbs.  The pt states that the edema in her left leg and ankle is better today and that she has no SOB at this time. The pt took a Zaroxolyn dose today due to weight greater than 172 lbs. 06/27/14  Weight= 171 lbs.  The pt reports that she has mild edema in her left ankle and no SOB at this time.  06/30/14 Weight= 171 lbs.  The pt reports no changes in her edema or SOB at this time. 07/03/14 Weight=  172 lbs. The pt states that she has increased edema in her left ankle and no SOB at this time. She states that she did take a Zaroxolyn dose today due to weight of 172 lbs.  07/04/14 Weight= 170 lbs.  The pt reports that the edema in her left ankle has improved and that she has no SOB at this time.  07/07/14 Weight= 172 lbs.  The pt states that she has increased edema in her left ankle and no SOB at this time. She states that she did take a Zaroxolyn dose today due to weight of 172 lbs.

## 2014-07-21 ENCOUNTER — Other Ambulatory Visit: Payer: Self-pay

## 2014-07-21 MED ORDER — POTASSIUM CHLORIDE CRYS ER 20 MEQ PO TBCR: EXTENDED_RELEASE_TABLET | ORAL | Status: DC

## 2014-07-21 NOTE — Telephone Encounter (Signed)
07/09/14  Weight= 170 lbs. The pt reports mild edema in her left ankle and no sob at this time. 07/10/14  Weight= 172 lbs. The pt reports no change in her left ankle edema and no sob at this time. The pt took a Zaroxolyn dose today due to weight of 172 lbs. 07/14/14  Weight= 173 lbs.  The pt states that she has increased edema in her left ankle and no sob at this time. She took a Zaroxolyn dose today due to weight greater than 172 lbs. 07/15/14  Weight= 172 lbs.  The pt reports no changes in her edema or SOB at this time. She took a Zaroxolyn dose today due to weight of 172 lbs. 07/18/14  Weight= 173 lbs.  The pt states that she has mild edema in her left ankle and no sob at this time. She did take a Zaroxolyn dose today due to weight greater than 172 lbs. 07/21/14  Weight= 172 lbs.  The pt reports increased edema in her left leg and ankle and no sob at this time. She took a Zaroxolyn dose today due to weight of 172 lbs.

## 2014-08-04 ENCOUNTER — Telehealth: Payer: Self-pay | Admitting: Cardiology

## 2014-08-04 NOTE — Telephone Encounter (Signed)
New problem   Pt daughter want pt to be admitted in hospital and want to talk to nurse. Please call daughter

## 2014-08-04 NOTE — Telephone Encounter (Signed)
Spoke with pt's daughter--wanted to know if any changes were made on 06-26-14 device check. According to report, no changes were made. Daughter aware.

## 2014-08-04 NOTE — Telephone Encounter (Signed)
[name], the pts daughter states that the pt has been taking a muscle relaxer that her PCP prescribed to her for back pain. She states that the pt is "hallucinating" and stating that she is seeing snakes and that the pt is very weak. She is advised to contact the pts PCP ASAP to make them aware of the pts condition and to get further instructions. She verbalized understanding.  Also, [name] wants to talk with a device tech concerning the pts last device check. Will forward message to device clinic.

## 2014-08-06 ENCOUNTER — Encounter (HOSPITAL_COMMUNITY): Payer: Self-pay | Admitting: Emergency Medicine

## 2014-08-06 ENCOUNTER — Telehealth: Payer: Self-pay

## 2014-08-06 ENCOUNTER — Emergency Department (HOSPITAL_COMMUNITY): Payer: Medicare Other

## 2014-08-06 ENCOUNTER — Observation Stay (HOSPITAL_COMMUNITY)
Admission: EM | Admit: 2014-08-06 | Discharge: 2014-08-08 | Disposition: A | Discharge status: Home / Self Care | Payer: Medicare Other | Attending: Internal Medicine | Admitting: Internal Medicine

## 2014-08-06 DIAGNOSIS — I10 Essential (primary) hypertension: Secondary | ICD-10-CM | POA: Diagnosis not present

## 2014-08-06 DIAGNOSIS — I2789 Other specified pulmonary heart diseases: Secondary | ICD-10-CM | POA: Insufficient documentation

## 2014-08-06 DIAGNOSIS — R404 Transient alteration of awareness: Secondary | ICD-10-CM | POA: Diagnosis not present

## 2014-08-06 DIAGNOSIS — E440 Moderate protein-calorie malnutrition: Secondary | ICD-10-CM | POA: Diagnosis present

## 2014-08-06 DIAGNOSIS — S298XXA Other specified injuries of thorax, initial encounter: Secondary | ICD-10-CM | POA: Diagnosis not present

## 2014-08-06 DIAGNOSIS — Z950 Presence of cardiac pacemaker: Secondary | ICD-10-CM | POA: Insufficient documentation

## 2014-08-06 DIAGNOSIS — R531 Weakness: Secondary | ICD-10-CM

## 2014-08-06 DIAGNOSIS — Z886 Allergy status to analgesic agent status: Secondary | ICD-10-CM | POA: Diagnosis not present

## 2014-08-06 DIAGNOSIS — R5381 Other malaise: Secondary | ICD-10-CM | POA: Diagnosis not present

## 2014-08-06 DIAGNOSIS — R5383 Other fatigue: Secondary | ICD-10-CM

## 2014-08-06 DIAGNOSIS — I5032 Chronic diastolic (congestive) heart failure: Secondary | ICD-10-CM | POA: Diagnosis not present

## 2014-08-06 DIAGNOSIS — I509 Heart failure, unspecified: Secondary | ICD-10-CM | POA: Diagnosis not present

## 2014-08-06 DIAGNOSIS — I440 Atrioventricular block, first degree: Secondary | ICD-10-CM | POA: Insufficient documentation

## 2014-08-06 DIAGNOSIS — I272 Pulmonary hypertension, unspecified: Secondary | ICD-10-CM | POA: Diagnosis present

## 2014-08-06 DIAGNOSIS — Z79899 Other long term (current) drug therapy: Secondary | ICD-10-CM | POA: Diagnosis not present

## 2014-08-06 DIAGNOSIS — R609 Edema, unspecified: Secondary | ICD-10-CM | POA: Diagnosis present

## 2014-08-06 DIAGNOSIS — E876 Hypokalemia: Principal | ICD-10-CM | POA: Diagnosis present

## 2014-08-06 HISTORY — DX: Unspecified osteoarthritis, unspecified site: M19.90

## 2014-08-06 LAB — CBC WITH DIFFERENTIAL/PLATELET
Basophils Absolute: 0 10*3/uL (ref 0.0–0.1)
Basophils Relative: 0 % (ref 0–1)
Eosinophils Absolute: 0 10*3/uL (ref 0.0–0.7)
Eosinophils Relative: 0 % (ref 0–5)
HCT: 39.1 % (ref 36.0–46.0)
Hemoglobin: 13.5 g/dL (ref 12.0–15.0)
Lymphocytes Relative: 11 % — ABNORMAL LOW (ref 12–46)
Lymphs Abs: 1.1 10*3/uL (ref 0.7–4.0)
MCH: 29.3 pg (ref 26.0–34.0)
MCHC: 34.5 g/dL (ref 30.0–36.0)
MCV: 84.8 fL (ref 78.0–100.0)
Monocytes Absolute: 0.9 10*3/uL (ref 0.1–1.0)
Monocytes Relative: 10 % (ref 3–12)
Neutro Abs: 7.8 10*3/uL — ABNORMAL HIGH (ref 1.7–7.7)
Neutrophils Relative %: 79 % — ABNORMAL HIGH (ref 43–77)
Platelets: 267 10*3/uL (ref 150–400)
RBC: 4.61 MIL/uL (ref 3.87–5.11)
RDW: 14.6 % (ref 11.5–15.5)
WBC: 9.8 10*3/uL (ref 4.0–10.5)

## 2014-08-06 LAB — URINALYSIS, ROUTINE W REFLEX MICROSCOPIC
Bilirubin Urine: NEGATIVE
Glucose, UA: NEGATIVE mg/dL
Hgb urine dipstick: NEGATIVE
KETONES UR: NEGATIVE mg/dL
LEUKOCYTES UA: NEGATIVE
NITRITE: NEGATIVE
PH: 8 (ref 5.0–8.0)
PROTEIN: NEGATIVE mg/dL
Specific Gravity, Urine: 1.012 (ref 1.005–1.030)
Urobilinogen, UA: 0.2 mg/dL (ref 0.0–1.0)

## 2014-08-06 LAB — BASIC METABOLIC PANEL
Anion gap: 16 — ABNORMAL HIGH (ref 5–15)
BUN: 19 mg/dL (ref 6–23)
CO2: 27 mEq/L (ref 19–32)
Calcium: 9.8 mg/dL (ref 8.4–10.5)
Chloride: 96 mEq/L (ref 96–112)
Creatinine, Ser: 0.83 mg/dL (ref 0.50–1.10)
GFR calc Af Amer: 75 mL/min — ABNORMAL LOW (ref >90)
GFR calc non Af Amer: 65 mL/min — ABNORMAL LOW (ref >90)
Glucose, Bld: 112 mg/dL — ABNORMAL HIGH (ref 70–99)
Potassium: 2.8 mEq/L — CL (ref 3.7–5.3)
Sodium: 139 mEq/L (ref 137–147)

## 2014-08-06 LAB — TROPONIN I: Troponin I: <0.3 ng/mL (ref <0.30)

## 2014-08-06 LAB — MAGNESIUM: Magnesium: 1.6 mg/dL (ref 1.5–2.5)

## 2014-08-06 MED ORDER — POTASSIUM CHLORIDE CRYS ER 20 MEQ PO TBCR
60.0000 meq | EXTENDED_RELEASE_TABLET | Freq: Once | ORAL | Status: AC
  Administered 2014-08-06: 60 meq via ORAL
  Filled 2014-08-06: qty 3

## 2014-08-06 MED ORDER — POLYETHYLENE GLYCOL 3350 17 G PO PACK
17.0000 g | PACK | Freq: Every day | ORAL | Status: DC | PRN
  Filled 2014-08-06: qty 1

## 2014-08-06 MED ORDER — ACETAMINOPHEN 325 MG PO TABS: 650.0000 mg | ORAL_TABLET | Freq: Four times a day (QID) | ORAL | Status: DC | PRN

## 2014-08-06 MED ORDER — HYDROCODONE-ACETAMINOPHEN 5-325 MG PO TABS
1.0000 | ORAL_TABLET | ORAL | Status: DC | PRN
  Administered 2014-08-07 (×2): 1 via ORAL
  Filled 2014-08-06 (×2): qty 1

## 2014-08-06 MED ORDER — FUROSEMIDE 80 MG PO TABS
80.0000 mg | ORAL_TABLET | Freq: Two times a day (BID) | ORAL | Status: DC
Start: 2014-08-07 — End: 2014-08-07
  Filled 2014-08-06 (×3): qty 1

## 2014-08-06 MED ORDER — ACETAMINOPHEN 650 MG RE SUPP: 650.0000 mg | Freq: Four times a day (QID) | RECTAL | Status: DC | PRN

## 2014-08-06 MED ORDER — ONDANSETRON HCL 4 MG/2ML IJ SOLN: 4.0000 mg | Freq: Four times a day (QID) | INTRAMUSCULAR | Status: DC | PRN

## 2014-08-06 MED ORDER — HYDROCODONE-ACETAMINOPHEN 5-325 MG PO TABS
2.0000 | ORAL_TABLET | Freq: Once | ORAL | Status: AC
  Administered 2014-08-06: 2 via ORAL
  Filled 2014-08-06: qty 2

## 2014-08-06 MED ORDER — MAGNESIUM OXIDE 400 MG PO TABS
400.0000 mg | ORAL_TABLET | Freq: Every day | ORAL | Status: DC
  Administered 2014-08-06 – 2014-08-08 (×3): 400 mg via ORAL
  Filled 2014-08-06 (×3): qty 1

## 2014-08-06 MED ORDER — HEPARIN SODIUM (PORCINE) 5000 UNIT/ML IJ SOLN
5000.0000 [IU] | Freq: Three times a day (TID) | INTRAMUSCULAR | Status: DC
  Administered 2014-08-06 – 2014-08-08 (×6): 5000 [IU] via SUBCUTANEOUS
  Filled 2014-08-06 (×9): qty 1

## 2014-08-06 MED ORDER — ONDANSETRON HCL 4 MG PO TABS: 4.0000 mg | ORAL_TABLET | Freq: Four times a day (QID) | ORAL | Status: DC | PRN

## 2014-08-06 MED ORDER — SODIUM CHLORIDE 0.9 % IV SOLN
INTRAVENOUS | Status: DC
  Administered 2014-08-06 – 2014-08-08 (×4): via INTRAVENOUS

## 2014-08-06 MED ORDER — POTASSIUM CHLORIDE CRYS ER 20 MEQ PO TBCR
60.0000 meq | EXTENDED_RELEASE_TABLET | Freq: Every day | ORAL | Status: DC
  Administered 2014-08-06: 60 meq via ORAL
  Filled 2014-08-06 (×2): qty 3

## 2014-08-06 MED ORDER — MELOXICAM 15 MG PO TABS
15.0000 mg | ORAL_TABLET | Freq: Every day | ORAL | Status: DC | PRN
  Administered 2014-08-06: 15 mg via ORAL
  Filled 2014-08-06 (×3): qty 1

## 2014-08-06 MED ORDER — CYCLOBENZAPRINE HCL 10 MG PO TABS: 5.0000 mg | ORAL_TABLET | Freq: Two times a day (BID) | ORAL | Status: DC | PRN

## 2014-08-06 NOTE — ED Provider Notes (Signed)
CSN: [id number]     Arrival date & time 08/06/14  1216 History   First MD Initiated Contact with Patient 08/06/14 1218     Chief Complaint  Patient presents with  . Fall  . Weakness     (Consider location/radiation/quality/duration/timing/severity/associated sxs/prior Treatment) HPI  80 year old female with generalized weakness. Progressively worsening over the past 2-3 weeks. Patient recently with hallucinations as well. Recent evaluation for same and felt to be secondary to her pain medication. Hallucinations have resolved when pain meds stopped, but weakness/fatigue continues to progress. Has been found on the ground at home twice within the past 5 days.  She says her legs just give out on her even despite use of walker. Patient lives by herself, although she does have someone check on her daily.   Past Medical History  Diagnosis Date  . Edema     . EF 65-70%, echo, November 29, 2010  . Pulmonary hypertension      46 mmHg... echo... January, 2012  . Cellulitis     Superficial cellulitis right lower leg   January 14, 2011  . AV block      2:1  January 14, 2011 / pacemaker plan when cellulitis resolved in length  . Bradycardia     2:1  AV block  . Hypokalemia     October, 2012  . Ejection fraction     EF 65%, echo, January, 2012  . Murmur     No significant valvular disease by echo, January, 2012  . Pacemaker     January, 2013  . Chronic diastolic CHF (congestive heart failure)   . Hypertension    Past Surgical History  Procedure Laterality Date  . Partial hysterectomy    . Hip arthroplasty      total   Family History  Problem Relation Age of Onset  . Cardiomyopathy Father   . Diabetes Father   . Cancer       son  . Coronary artery disease      son   History  Substance Use Topics  . Smoking status: Never Smoker   . Smokeless tobacco: Not on file  . Alcohol Use: No   OB History   Grav Para Term Preterm Abortions TAB SAB Ect Mult Living                  Review of Systems  All systems reviewed and negative, other than as noted in HPI.   Allergies  Aspirin  Home Medications   Prior to Admission medications   Medication Sig Start Date End Date Taking? Authorizing Provider  Cholecalciferol (VITAMIN D3) 1000 UNITS CAPS Take 1 capsule by mouth daily.     Yes Historical Provider, MD  cyclobenzaprine (FLEXERIL) 5 MG tablet Take 5 mg by mouth 2 (two) times daily as needed. For muscle spasm.   Yes Historical Provider, MD  furosemide (LASIX) 80 MG tablet Take 80 mg by mouth 2 (two) times daily.   Yes Historical Provider, MD  magnesium oxide (MAG-OX) 400 MG tablet Take 1 tablet (400 mg total) by mouth daily. 03/10/14  Yes Luis Abed, MD  meloxicam (MOBIC) 15 MG tablet Take 15 mg by mouth daily as needed for pain.    Yes Historical Provider, MD  metolazone (ZAROXOLYN) 2.5 MG tablet Take 1 tablet (2.5 mg total) by mouth daily as needed. Take if she gains excessive fluid or edema despite taking lasix. 06/03/14  Yes Luis Abed, MD  Multiple Vitamins-Minerals (MULTIVITAMINS THER. W/MINERALS)  TABS Take 1 tablet by mouth daily.     Yes Historical Provider, MD  potassium chloride SA (K-DUR,KLOR-CON) 20 MEQ tablet Take 60 mEq by mouth daily.   Yes Historical Provider, MD   BP 146/108  Pulse 107  Temp(Src) 97.6 F (36.4 C) (Oral)  Resp 20  SpO2 100% Physical Exam  Nursing note and vitals reviewed. Constitutional: She appears well-developed and well-nourished. No distress.  HENT:  Head: Normocephalic and atraumatic.  Eyes: Conjunctivae are normal. Right eye exhibits no discharge. Left eye exhibits no discharge.  Neck: Neck supple.  Cardiovascular: Normal rate, regular rhythm and normal heart sounds.  Exam reveals no gallop and no friction rub.   No murmur heard. Pulmonary/Chest: Effort normal and breath sounds normal. No respiratory distress.  Abdominal: Soft. She exhibits no distension. There is no tenderness.  Musculoskeletal:  With left  lower extremity seems slightly short as compared to the right.    Neurological: She is alert.  Skin: Skin is warm and dry. She is not diaphoretic.  Psychiatric: She has a normal mood and affect. Her behavior is normal. Thought content normal.    ED Course  Procedures (including critical care time) Labs Review Labs Reviewed  CBC WITH DIFFERENTIAL - Abnormal; Notable for the following:    Neutrophils Relative % 79 (*)    Neutro Abs 7.8 (*)    Lymphocytes Relative 11 (*)    All other components within normal limits  BASIC METABOLIC PANEL - Abnormal; Notable for the following:    Potassium 2.8 (*)    Glucose, Bld 112 (*)    GFR calc non Af Amer 65 (*)    GFR calc Af Amer 75 (*)    Anion gap 16 (*)    All other components within normal limits  TROPONIN I  MAGNESIUM  URINALYSIS, ROUTINE W REFLEX MICROSCOPIC    Imaging Review Dg Chest 2 View  08/06/2014   CLINICAL DATA:  Fall, weakness  EXAM: CHEST  2 VIEW  COMPARISON:  Chest radiograph 04/21/2014  FINDINGS: Pacer apparatus overlies the left hemi thorax, leads are stable in position. Stable cardiac mediastinal contours. Persistent elevation of the right hemidiaphragm. No consolidative pulmonary opacities. No pleural effusion or pneumothorax. Bilateral glenohumeral joint degenerative changes.  IMPRESSION: No acute cardiopulmonary process.   Electronically Signed   By: Annia Belt M.D.   On: 08/06/2014 15:53     EKG Interpretation   Date/Time:  Wednesday August 06 2014 12:16:28 EDT Ventricular Rate:  95 PR Interval:  191 QRS Duration: 157 QT Interval:  424 QTC Calculation: 533 R Axis:   -78 Text Interpretation:  Sinus rhythm Probable left atrial enlargement  Nonspecific IVCD with LAD Confirmed by Juleen China  MD, Bedelia Pong ([id number]) on  08/06/2014 4:57:10 PM      MDM   Final diagnoses:  Hypokalemia  Generalized weakness   80 year old female with generalized weakness. Workup significant for hypokalemia. Will check a magnesium  level. Begin repletion. May be contributing to her weakness. Suspect component of deconditioning as well.    Raeford Razor, MD 08/06/14 [phone]

## 2014-08-06 NOTE — Telephone Encounter (Addendum)
07/23/14  Weight= 171 lbs.  The pt states that she has mild edema in her left ankle and no sob at this time. 07/25/14  Weight= 174 lbs.  The pt reports that she has increased edema in her left ankle and no sob at this time. She did take a Zaroxolyn dose today due to weight greater than 172 lbs. 07/30/14  Weight= 174 lbs.  The pt states that she has mild edema in her left ankle and no sob at this time. She did take a Zaroxolyn dose today due to weight greater than 172 lbs. 08/01/14  Weight= 171 lbs.  The pt states that she has mild edema in her left ankle and no sob at this time. 08/04/14   [name], the pts daughter states that the pt has been taking a muscle relaxer that her PCP prescribed to her for back pain. She states that the pt is "hallucinating" and stating that she is seeing snakes and that the pt is very weak. She is advised to contact the pts PCP ASAP to make them aware of the pts condition and to get further instructions. She verbalized understanding. 08/06/14   LMTCB.

## 2014-08-06 NOTE — Progress Notes (Signed)
On call physician paged due to pt's pain level not being controlled on prn medication - 10 out of 10 throbbing bilateral pain reported.

## 2014-08-06 NOTE — H&P (Signed)
[facility] History and Physical  [name] ZOX:[id number] DOB: Jan 19, 1931 DOA: 08/06/2014  Referring physician: Dr. Queen Slough PCP: Alice Reichert, MD   Chief Complaint: generalized weakness  HPI: [name] is a 80 y.o. female  Female with past medical history, of chronic diastolic heart failure, pulmonary hypertension, 2:1 AV block with pacemaker, recently signed out of the skilled nursing facility about 2 weeks ago that comes in for generalized weakness chest been progressively getting worse over the last 2 weeks. She was recently taken to primary care Dr. and was taken off her pain medication because of hallucinations. She has been found on the ground twice in the past 5 days. As per patient she has never fallen she just lays down on the floor and could not get up. She relates that her legs give out on her. She denies any dizziness upon standing. She relates she lives by herself and someone checks on her daily. She denies any fever, chills, nausea, diarrhea, chest pain or shortness of breath  In the ED: A basic metabolic panel was done and showed a potassium of 2.8., So we are consulted for further evaluation.   Review of Systems:  Constitutional:  No weight loss, night sweats, Fevers, chills, fatigue.  HEENT:  No headaches, Difficulty swallowing,Tooth/dental problems,Sore throat,  No sneezing, itching, ear ache, nasal congestion, post nasal drip,  Cardio-vascular:  No chest pain, Orthopnea, PND, swelling in lower extremities, anasarca, dizziness, palpitations  GI:  No heartburn, indigestion, abdominal pain, nausea, vomiting, diarrhea, change in bowel habits, loss of appetite  Resp:  No shortness of breath with exertion or at rest. No excess mucus, no productive cough, No non-productive cough, No coughing up of blood.No change in color of mucus.No wheezing.No chest wall deformity  Skin:  no rash or lesions.  GU:  no dysuria, change in color of urine, no urgency or  frequency. No flank pain.  Musculoskeletal:  No joint pain or swelling. No decreased range of motion. No back pain.  Psych:  No change in mood or affect. No depression or anxiety. No memory loss.   Past Medical History  Diagnosis Date  . Edema     . EF 65-70%, echo, November 29, 2010  . Pulmonary hypertension      46 mmHg... echo... January, 2012  . Cellulitis     Superficial cellulitis right lower leg   January 14, 2011  . AV block      2:1  January 14, 2011 / pacemaker plan when cellulitis resolved in length  . Bradycardia     2:1  AV block  . Hypokalemia     October, 2012  . Ejection fraction     EF 65%, echo, January, 2012  . Murmur     No significant valvular disease by echo, January, 2012  . Pacemaker     January, 2013  . Chronic diastolic CHF (congestive heart failure)   . Hypertension    Past Surgical History  Procedure Laterality Date  . Partial hysterectomy    . Hip arthroplasty      total   Social History:  reports that she has never smoked. She does not have any smokeless tobacco history on file. She reports that she does not drink alcohol or use illicit drugs.  Allergies  Allergen Reactions  . Aspirin     REACTION: rash    Family History  Problem Relation Age of Onset  . Cardiomyopathy Father   . Diabetes Father   . Heart  failure Father   . Cancer       son  . Coronary artery disease      son  . Dementia Mother      Prior to Admission medications   Medication Sig Start Date End Date Taking? Authorizing Provider  Cholecalciferol (VITAMIN D3) 1000 UNITS CAPS Take 1 capsule by mouth daily.     Yes Historical Provider, MD  cyclobenzaprine (FLEXERIL) 5 MG tablet Take 5 mg by mouth 2 (two) times daily as needed. For muscle spasm.   Yes Historical Provider, MD  furosemide (LASIX) 80 MG tablet Take 80 mg by mouth 2 (two) times daily.   Yes Historical Provider, MD  magnesium oxide (MAG-OX) 400 MG tablet Take 1 tablet (400 mg total) by mouth daily.  03/10/14  Yes Luis Abed, MD  meloxicam (MOBIC) 15 MG tablet Take 15 mg by mouth daily as needed for pain.    Yes Historical Provider, MD  metolazone (ZAROXOLYN) 2.5 MG tablet Take 1 tablet (2.5 mg total) by mouth daily as needed. Take if she gains excessive fluid or edema despite taking lasix. 06/03/14  Yes Luis Abed, MD  Multiple Vitamins-Minerals (MULTIVITAMINS THER. W/MINERALS) TABS Take 1 tablet by mouth daily.     Yes Historical Provider, MD  potassium chloride SA (K-DUR,KLOR-CON) 20 MEQ tablet Take 60 mEq by mouth daily.   Yes Historical Provider, MD   Physical Exam: Filed Vitals:   08/06/14 1430 08/06/14 1500 08/06/14 1515 08/06/14 1630  BP: 140/76 146/108  123/44  Pulse:   107   Temp:      TempSrc:      Resp: SpO2:   100%     Wt Readings from Last 3 Encounters:  05/08/14 76.114 kg (167 lb 12.8 oz)  04/28/14 80.287 kg (177 lb)  04/23/14 80.513 kg (177 lb 8 oz)    General:  Appears calm and comfortable, cachectic, head is atraumatic Eyes: PERRL, normal lids, irises & conjunctiva ENT: grossly normal hearing, lips & tongue Neck: Negative JVD Cardiovascular: RRR, no m/r/g. Trace edema Telemetry: SR, no arrhythmias  Respiratory: CTA bilaterally, no w/r/r. Normal respiratory effort. Abdomen: soft, ntnd Skin: She has an erythematous cheek from laying on the floor. Discoloration of upper or lower extremity. Musculoskeletal: grossly normal tone BUE/BLE Psychiatric: grossly normal mood and affect, speech fluent and appropriate Neurologic: grossly non-focal.          Labs on Admission:  Basic Metabolic Panel:  Recent Labs Lab 08/06/14 1300  NA 139  K 2.8*  CL 96  CO2 27  GLUCOSE 112*  BUN 19  CREATININE 0.83  CALCIUM 9.8  MG 1.6   Liver Function Tests: No results found for this basename: AST, ALT, ALKPHOS, BILITOT, PROT, ALBUMIN,  in the last 168 hours No results found for this basename: LIPASE, AMYLASE,  in the last 168 hours No results found  for this basename: AMMONIA,  in the last 168 hours CBC:  Recent Labs Lab 08/06/14 1300  WBC 9.8  NEUTROABS 7.8*  HGB 13.5  HCT 39.1  MCV 84.8  PLT 267   Cardiac Enzymes:  Recent Labs Lab 08/06/14 1300  TROPONINI <0.30    BNP (last 3 results)  Recent Labs  04/21/14 1742  PROBNP 1164.0*   CBG: No results found for this basename: GLUCAP,  in the last 168 hours  Radiological Exams on Admission: Dg Chest 2 View  08/06/2014   CLINICAL DATA:  Fall, weakness  EXAM: CHEST  2 VIEW  COMPARISON:  Chest radiograph 04/21/2014  FINDINGS: Pacer apparatus overlies the left hemi thorax, leads are stable in position. Stable cardiac mediastinal contours. Persistent elevation of the right hemidiaphragm. No consolidative pulmonary opacities. No pleural effusion or pneumothorax. Bilateral glenohumeral joint degenerative changes.  IMPRESSION: No acute cardiopulmonary process.   Electronically Signed   By: Annia Belt M.D.   On: 08/06/2014 15:53    EKG: Independently reviewed. Paced rhythm with possible left atrial enlargement.  Assessment/Plan Generalized weakness due to Hypokalemia: - She is on 2 diuretics at home Lasix and metolazone which is probably causing her hypokalemia. - She is nonfocal and physical exam, replete her potassium and replete magnesium and check of electrolytes in the morning. - Hold Lasix and metolazone. - Check a UA chest x-ray shows no acute cardio pulmonary disease. - Gentle IV fluids for 12 hours.  Hypertension - Blood pressure stable, hold Lasix and metolazone. - Presumed morning.  Chronic diastolic CHF (congestive heart failure) - Seems to be euvolemic.   Code Status: none DVT Prophylaxis:heparin Family Communication: son Disposition Plan: observation  Time spent: 80 minutes  [name] [facility] Pager [phone]

## 2014-08-06 NOTE — ED Notes (Signed)
Patient transported to X-ray 

## 2014-08-06 NOTE — Progress Notes (Signed)
[name] [id number] Code Status:Full Admission Data: 08/06/2014 6:48 PM Attending Provider:  David Stall MWN:[id number],UYQIH G, MD Consults/ Treatment Team:    [name] is a 80 y.o. female patient admitted from ED awake, alert - oriented  X 3 - no acute distress noted.  VSS - Blood pressure 108/57, pulse 108, temperature 97.6 F (36.4 C), temperature source Oral, resp. rate 24, SpO2 100.00%.    IV in place, occlusive dsg intact without redness.  Orientation to room, and floor completed with information packet given to patient/family.  Patient declined safety video at this time.  Admission INP armband ID verified with patient/family, and in place.   SR up x 2, fall assessment complete, with patient and family able to verbalize understanding of risk associated with falls, and verbalized understanding to call nsg before up out of bed.  Call light within reach, patient able to voice, and demonstrate understanding.  Skin, clean-dry- intact without evidence of bruising, or skin tears.   No evidence of skin break down noted on exam.     Will cont to eval and treat per MD orders.  [name] L, RN 08/06/2014 6:48 PM

## 2014-08-06 NOTE — ED Notes (Addendum)
Pt from home via [facility] with c/o increasing weakness with no specified start date.  Pt states she slid from her chair to the floor.  Denies pain, only reports weakness.  Pt in NAD. A&O x4.

## 2014-08-07 DIAGNOSIS — E876 Hypokalemia: Secondary | ICD-10-CM | POA: Diagnosis not present

## 2014-08-07 LAB — BASIC METABOLIC PANEL
ANION GAP: 12 (ref 5–15)
BUN: 17 mg/dL (ref 6–23)
CO2: 22 meq/L (ref 19–32)
CREATININE: 0.9 mg/dL (ref 0.50–1.10)
Calcium: 8.9 mg/dL (ref 8.4–10.5)
Chloride: 107 mEq/L (ref 96–112)
GFR calc non Af Amer: 59 mL/min — ABNORMAL LOW (ref >90)
GFR, EST AFRICAN AMERICAN: 68 mL/min — AB (ref >90)
Glucose, Bld: 79 mg/dL (ref 70–99)
POTASSIUM: 5.7 meq/L — AB (ref 3.7–5.3)
SODIUM: 141 meq/L (ref 137–147)

## 2014-08-07 LAB — TSH: TSH: 2.31 u[IU]/mL (ref 0.350–4.500)

## 2014-08-07 MED ORDER — MAGNESIUM SULFATE IN D5W 10-5 MG/ML-% IV SOLN
1.0000 g | Freq: Once | INTRAVENOUS | Status: AC
  Administered 2014-08-07: 1 g via INTRAVENOUS
  Filled 2014-08-07: qty 100

## 2014-08-07 MED ORDER — ENSURE COMPLETE PO LIQD
237.0000 mL | Freq: Two times a day (BID) | ORAL | Status: DC
  Administered 2014-08-07 – 2014-08-08 (×3): 237 mL via ORAL

## 2014-08-07 MED ORDER — HYDROCERIN EX CREA
TOPICAL_CREAM | Freq: Two times a day (BID) | CUTANEOUS | Status: DC
  Administered 2014-08-07 (×2): via TOPICAL
  Administered 2014-08-08: 1 via TOPICAL
  Filled 2014-08-07: qty 113

## 2014-08-07 MED ORDER — SODIUM POLYSTYRENE SULFONATE 15 GM/60ML PO SUSP
15.0000 g | Freq: Once | ORAL | Status: AC
  Administered 2014-08-07: 15 g via ORAL
  Filled 2014-08-07: qty 60

## 2014-08-07 NOTE — Progress Notes (Signed)
PROGRESS NOTE  [name] BJY:[id number] DOB: [birth year] DOA: 08/06/2014 PCP: Alice Reichert, MD  HPI/Subjective: Pt is an 80 yo female with a prior history of chronic diastolic heart failure, pulmonary hypertension, 2:1 AV block with pacemaker who presented to the ED for generalized weakness.  She was admitted for hypokalemia (Potassium was 2.8 in the ED on 9/16).  She states she has been taking both Lasix 80 mg BID and Metolazone 2.5 mg prn at home.  She states she is feeling better today.  Assessment/Plan:  Generalized weakness due to Hypokalemia:  - She is on 2 diuretics at home - Lasix and Metolazone - Likely cause of her hypokalemia.  - Repleted her potassium and replete magnesium and hold Lasix and Metolazone. On 9/17, Potassium is elevated at 5.7 and Magnesium is 1.6.  Will discontinue Potassium supplements and give one dose of Kayexalate.  Continue to monitor electrolytes.  - UA is negative.  Chest x-ray shows no acute cardio pulmonary disease.    Hypertension  - Blood pressure stable, continue to hold Lasix and metolazone.   Chronic diastolic CHF (congestive heart failure)  - Seems to be euvolemic.  DVT Prophylaxis:  heparin  Code Status: None Family Communication: No family at bedside. Disposition Plan: Remain inpatient.    Objective: Filed Vitals:   08/06/14 1906 08/06/14 1931 08/06/14 2238 08/07/14 0539  BP: 114/47  120/53 132/59  Pulse: 93  90 84  Temp: 97.8 F (36.6 C)  97.5 F (36.4 C) 97 F (36.1 C)  TempSrc: Oral  Oral Oral  Resp: Height:  5' (1.524 m)    Weight:  76.431 kg (168 lb 8 oz)    SpO2: 100%  100% 97%    Intake/Output Summary (Last 24 hours) at 08/07/14 1154 Last data filed at 08/07/14 0900  Gross per 24 hour  Intake 1143.75 ml  Output    400 ml  Net 743.75 ml   Filed Weights   08/06/14 1931  Weight: 76.431 kg (168 lb 8 oz)    Exam: General: Elderly well developed, well nourished female, NAD, appears stated age.   Difficult to understand speech.  Neck: Supple, no JVD, no hepatojugular reflex, no masses.  Cardiovascular: RRR, S1 S2 auscultated, no rubs, murmurs or gallops.   Respiratory: Clear to auscultation bilaterally with equal chest rise  Abdomen: Soft, nontender, nondistended, + bowel sounds  Extremities: Warm dry without cyanosis in lower extremities.  1+ edema present in left ankle.  Ulcers present in lower legs bilaterally. No discharge noted. Neuro: AAOx3, cranial nerves grossly intact. Strength 5/5 in upper and lower extremities    Data Reviewed: Basic Metabolic Panel:  Recent Labs Lab 08/06/14 1300 08/07/14 0735  NA 139 141  K 2.8* 5.7*  CL 96 107  CO2 27 22  GLUCOSE 112* 79  BUN 19 17  CREATININE 0.83 0.90  CALCIUM 9.8 8.9  MG 1.6  --     Recent Labs Lab 08/06/14 1300  WBC 9.8  NEUTROABS 7.8*  HGB 13.5  HCT 39.1  MCV 84.8  PLT 267   Cardiac Enzymes:  Recent Labs Lab 08/06/14 1300  TROPONINI <0.30   Studies: Dg Chest 2 View  08/06/2014   CLINICAL DATA:  Fall, weakness  EXAM: CHEST  2 VIEW  COMPARISON:  Chest radiograph 04/21/2014  FINDINGS: Pacer apparatus overlies the left hemi thorax, leads are stable in position. Stable cardiac mediastinal contours. Persistent elevation of the right hemidiaphragm. No consolidative pulmonary opacities.  No pleural effusion or pneumothorax. Bilateral glenohumeral joint degenerative changes.  IMPRESSION: No acute cardiopulmonary process.   Electronically Signed   By: Annia Belt M.D.   On: 08/06/2014 15:53    Scheduled Meds: . heparin  5,000 Units Subcutaneous 3 times per day  . hydrocerin   Topical BID  . magnesium oxide  400 mg Oral Daily   Continuous Infusions: . sodium chloride 75 mL/hr at 08/07/14 3361    Principal Problem:   Hypokalemia Active Problems:   Generalized weakness   Edema   Pulmonary hypertension   Hypertension   Chronic diastolic CHF (congestive heart failure)   Protein-calorie malnutrition,  moderate   Randol Kern, PA-S2   [facility] Pager [phone]. If 7PM-7AM, please contact night-coverage at [email or web address], password [facility] 08/07/2014, 11:54 AM  LOS: 1 day   Attending  Patient was seen, examined,treatment plan was discussed with the Physician extender. I have directly reviewed the clinical findings, lab, imaging studies and management of this patient in detail. I have made the necessary changes to the above noted documentation, and agree with the documentation, as recorded by the Physician extender.   Huey Bienenstock  MD  Pager [phone] [facility].

## 2014-08-07 NOTE — Care Management Note (Signed)
    Page 1 of 2   08/08/2014     2:25:18 PM CARE MANAGEMENT NOTE 08/08/2014  Patient:  [name], [name]   Account Number:  [id number]  Date Initiated:  08/07/2014  Documentation initiated by:  Letha Cape  Subjective/Objective Assessment:   dx hypokalemia  admit- lives alone, has medi alert as well.  Has an aide in tha am and the pm to help with meals and clean.     Action/Plan:   Anticipated DC Date:  08/08/2014   Anticipated DC Plan:  HOME W HOME HEALTH SERVICES  In-house referral  Clinical Social Worker      DC Associate Professor  CM consult      [facility] Choice  HOME HEALTH   Choice offered to / List presented to:  C-1 Patient        HH arranged  HH-1 RN  HH-2 PT  HH-3 OT  HH-4 NURSE'S AIDE  HH-6 SOCIAL WORKER      HH agency  [facility]   Status of service:  Completed, signed off Medicare Important Message given?  NO (If response is "NO", the following Medicare IM given date fields will be blank) Date Medicare IM given:   Medicare IM given by:   Date Additional Medicare IM given:   Additional Medicare IM given by:    Discharge Disposition:  HOME W HOME HEALTH SERVICES  Per UR Regulation:  Reviewed for med. necessity/level of care/duration of stay  If discussed at Long Length of Stay Meetings, dates discussed:    Comments:  08/08/14 1421 Letha Cape RN, BSN [phone] patient is for dc today, NCM spoke with [place], patient's daughter, she states her brother who lives in [place] will be coming to pick patient up after 6 pm today.  The daughter will contact Johnny Bridge , the patient's aide to let her know patient is being dc [facility].  NCM had to explain to daughter this am that since patient is observation her insurance will not cover snf, but if they want her to go they can pay privately, she states they do not have the funds for that.  NCM suggested that they look into an adult day care in [place] to see what they can offer.  Patient chose [facility] for [facility]  services for [facility], PT, OT, aide and social worker.  Referral made to [facility] , Lupita Leash notiifed.  Soc will begin 24-48 hrs post dc.  08/07/14 1445 Letha Cape RNl, BSN [phone] NCM spoke with patient and she states she lives alone but has an aide in the am and in the pm.  Patient states her son really wants her to go to a snf but she does not want to go there.  She states she was going to call her other son to see if he could stay with her at night and also she is going to contact her daughter to see if possibley she could stay with her as well.  NCM informed patient that I will check with her later to see what she has decided, she stated ok.  There is a referral for [facility], PT, aide and Child psychotherapist, patient not ready to choose yet, left her the agency list for [facility].

## 2014-08-07 NOTE — Progress Notes (Signed)
INITIAL NUTRITION ASSESSMENT  DOCUMENTATION CODES Per approved criteria  -Obesity Unspecified   INTERVENTION: -Ensure Complete BID, each supplement provides 350 kcal, 13 g protein -Encourage adequate PO intake of meals and supplements  NUTRITION DIAGNOSIS: Inadequate oral intake related to decreased appetite as evidenced by 60% meal intake.   Goal: Patient will meet >/=90% of estimated nutrition needs  Monitor:  PO intake, weight, labs, I/Os  Reason for Assessment: Malnutrition screening tool  80 y.o. female  Admitting Dx: Hypokalemia  ASSESSMENT: 80 year old female with chronic diastolic heart failure, pulmonary hypertension, and AV block with pacemaker, presents with generalized weakness, admitted with hypokalemia.   Patient reports that her appetite is typically good, with breakfast provided for her, and a home health aide who cooks her dinner. Poor appetite over the last week is poor due to low potassium. She states that this usually happens when her potassium is low. No short term weight loss noted, but patient has lost about 7% of her usual weight in 1 year, which is not significant over the time frame. She tries to eat foods high in potassium at home and drinks Boost shakes regularly. No subcutaneous fat or muscle depletion.   Height: Ht Readings from Last 1 Encounters:  08/06/14 5' (1.524 m)    Weight: Wt Readings from Last 1 Encounters:  08/06/14 168 lb 8 oz (76.431 kg)    Ideal Body Weight: 100 pounds  % Ideal Body Weight: 68%  Wt Readings from Last 10 Encounters:  08/06/14 168 lb 8 oz (76.431 kg)  05/08/14 167 lb 12.8 oz (76.114 kg)  04/28/14 177 lb (80.287 kg)  04/23/14 177 lb 8 oz (80.513 kg)  03/10/14 174 lb (78.926 kg)  09/09/13 185 lb (83.915 kg)  05/23/13 180 lb 6.4 oz (81.829 kg)  03/07/13 178 lb (80.74 kg)  11/22/12 182 lb (82.555 kg)  09/24/12 182 lb 6.4 oz (82.736 kg)    Usual Body Weight: 180 pounds, 2014  % Usual Body Weight:  93%  BMI:  Body mass index is 32.91 kg/(m^2). Patient is overweight  Estimated Nutritional Needs: Kcal: 1400-1500 kcal Protein: 75-90 g Fluid: 1.9-2.1 L/day  Skin: intact  Diet Order: Cardiac  EDUCATION NEEDS: -No education needs identified at this time   Intake/Output Summary (Last 24 hours) at 08/07/14 1212 Last data filed at 08/07/14 0900  Gross per 24 hour  Intake 1143.75 ml  Output    400 ml  Net 743.75 ml    Last BM: PTA   Labs:   Recent Labs Lab 08/06/14 1300 08/07/14 0735  NA 139 141  K 2.8* 5.7*  CL 96 107  CO2 27 22  BUN 19 17  CREATININE 0.83 0.90  CALCIUM 9.8 8.9  MG 1.6  --   GLUCOSE 112* 79    CBG (last 3)  No results found for this basename: GLUCAP,  in the last 72 hours  Scheduled Meds: . heparin  5,000 Units Subcutaneous 3 times per day  . hydrocerin   Topical BID  . magnesium oxide  400 mg Oral Daily    Continuous Infusions: . sodium chloride 75 mL/hr at 08/07/14 7026    Past Medical History  Diagnosis Date  . Edema     . EF 65-70%, echo, November 29, 2010  . Pulmonary hypertension      46 mmHg... echo... January, 2012  . Cellulitis     Superficial cellulitis right lower leg   January 14, 2011  . AV block  2:1  January 14, 2011 / pacemaker plan when cellulitis resolved in length  . Bradycardia     2:1  AV block  . Hypokalemia     October, 2012  . Ejection fraction     EF 65%, echo, January, 2012  . Murmur     No significant valvular disease by echo, January, 2012  . Pacemaker     January, 2013  . Chronic diastolic CHF (congestive heart failure)   . Hypertension   . Arthritis     "qwhere"    Past Surgical History  Procedure Laterality Date  . Hip arthroplasty Right   . Vaginal hysterectomy      "partial"  . Cholecystectomy    . Insert / replace / remove pacemaker  11/2011    Linnell Fulling, RD, LDN Pager #: [phone] After-Hours Pager #: [phone]

## 2014-08-07 NOTE — Evaluation (Signed)
Physical Therapy Evaluation Patient Details Name: [name] MRN: [id number] DOB: 04-Jul-1931 Today's Date: 08/07/2014   History of Present Illness  Pt. was admitted 08/06/14 with generalized weakness and K+ of 2.8 in the ED.[name]  Pt. with history of chronic diastolic heart failure, pulmonary HTN, 2:1 AV block with pacer  Clinical Impression  Pt. Presents to PT with a decline in her usual level of function within her home, now needing +2 assist for mobility and unable to ambulate.  She will  Benefit from acute Pt to address these and below mobility issues.  Unless pt. Regains significant amount of strength, I do not believe she can safely return home,  as she does not have 24 hour assist. I will discuss with case manager.  Pt. Needs greatly increased time to attempt tasks and is easily distracted.  Do not believe she could tolerate rigors of IP Rehab at this time.    Follow Up Recommendations SNF;Supervision/Assistance - 24 hour    Equipment Recommendations  None recommended by PT    Recommendations for Other Services       Precautions / Restrictions Precautions Precautions: Fall;Other (comment) Precaution Comments: pt. states one recent fall at home while attempting to stand from her lift recliner chair Restrictions Weight Bearing Restrictions: No      Mobility  Bed Mobility Overal bed mobility:  (not assessed; pt. presents  recliner chair)                Transfers Overall transfer level: Needs assistance Equipment used: Rolling walker (2 wheeled) Transfers: Sit to/from Stand Sit to Stand: +2 physical assistance;Max assist         General transfer comment: 2 attempts made to rise to stand from recliner chair.  Pt. only able to rise partially to stand on 2nd attempt.  She c/o her knees and feet hurting her which she believes is her limiting factor.  Ambulation/Gait Ambulation/Gait assistance:  (pt. unable)              Stairs            Wheelchair  Mobility    Modified Rankin (Stroke Patients Only)       Balance Overall balance assessment: Needs assistance Sitting-balance support: No upper extremity supported;Feet supported Sitting balance-Leahy Scale: Fair     Standing balance support: Bilateral upper extremity supported;During functional activity Standing balance-Leahy Scale: Poor Standing balance comment: needed assist from RW and external support to attempt to stand                             Pertinent Vitals/Pain Pain Assessment: 0-10 Pain Score: 10-Worst pain ever Pain Location: bottoms of feet and lower legs at rest; increasing knee pain with attempts to stand up from recliner Pain Descriptors / Indicators: Aching;Sore Pain Intervention(s): Repositioned;Limited activity within patient's tolerance    Home Living Family/patient expects to be discharged to:: Private residence Living Arrangements: Alone Available Help at Discharge: Personal care attendant;Family;Available PRN/intermittently Type of Home: House Home Access: Stairs to enter Entrance Stairs-Rails: Right Entrance Stairs-Number of Steps: 3 Home Layout: One level Home Equipment: Walker - 2 wheels;Wheelchair - manual;Cane - single point      Prior Function Level of Independence: Independent with assistive device(s)         Comments: reports she is normally independent with RW until onset of generalized weakness     Hand Dominance        Extremity/Trunk Assessment  Upper Extremity Assessment: Generalized weakness           Lower Extremity Assessment: Generalized weakness         Communication   Communication: No difficulties  Cognition Arousal/Alertness: Awake/alert Behavior During Therapy: WFL for tasks assessed/performed Overall Cognitive Status: Within Functional Limits for tasks assessed                      General Comments General comments (skin integrity, edema, etc.): pt. with distal lower leg  "wrinkling" bilaterally     Exercises        Assessment/Plan    PT Assessment Patient needs continued PT services  PT Diagnosis Difficulty walking;Abnormality of gait;Generalized weakness;Acute pain   PT Problem List Decreased strength;Decreased activity tolerance;Decreased balance;Decreased mobility;Decreased knowledge of use of DME;Pain  PT Treatment Interventions DME instruction;Gait training;Functional mobility training;Therapeutic activities;Therapeutic exercise;Balance training;Patient/family education   PT Goals (Current goals can be found in the Care Plan section) Acute Rehab PT Goals Patient Stated Goal: pt. wants to return to her home as soon as is feasible  PT Goal Formulation: With patient Time For Goal Achievement: 08/21/14 Potential to Achieve Goals: Fair    Frequency Min 3X/week   Barriers to discharge Decreased caregiver support pt. has an aide 4 hours per day; niece checks in on her in the am and son in the evenings.  I do not believe this is adequate for her current functional impairments.  She could not exit her home in the event of an emergency at her currentl functional level.    Co-evaluation               End of Session Equipment Utilized During Treatment: Gait belt Activity Tolerance: Patient limited by pain;Patient limited by fatigue Patient left: in chair;with call bell/phone within reach;with chair alarm set Nurse Communication: Mobility status    Functional Assessment Tool Used: clinical judgement and observation Functional Limitation: Mobility: Walking and moving around Mobility: Walking and Moving Around Current Status (Z0017): At least 60 percent but less than 80 percent impaired, limited or restricted Mobility: Walking and Moving Around Goal Status [phone]): At least 40 percent but less than 60 percent impaired, limited or restricted    Time: 1319-1410 PT Time Calculation (min): 51 min   Charges:   PT Evaluation $Initial PT Evaluation  Tier I: 1 Procedure PT Treatments $Therapeutic Activity: 23-37 mins   PT G Codes:   Functional Assessment Tool Used: clinical judgement and observation Functional Limitation: Mobility: Walking and moving around    Ferman Hamming 08/07/2014, 2:30 PM Weldon Picking PT Acute Rehab Services [phone] [place] [phone]

## 2014-08-08 DIAGNOSIS — E876 Hypokalemia: Secondary | ICD-10-CM | POA: Diagnosis not present

## 2014-08-08 LAB — BASIC METABOLIC PANEL
ANION GAP: 13 (ref 5–15)
BUN: 14 mg/dL (ref 6–23)
CALCIUM: 8.6 mg/dL (ref 8.4–10.5)
CO2: 25 mEq/L (ref 19–32)
Chloride: 104 mEq/L (ref 96–112)
Creatinine, Ser: 0.87 mg/dL (ref 0.50–1.10)
GFR calc non Af Amer: 61 mL/min — ABNORMAL LOW (ref >90)
GFR, EST AFRICAN AMERICAN: 71 mL/min — AB (ref >90)
Glucose, Bld: 96 mg/dL (ref 70–99)
Potassium: 4 mEq/L (ref 3.7–5.3)
SODIUM: 142 meq/L (ref 137–147)

## 2014-08-08 MED ORDER — CETYLPYRIDINIUM CHLORIDE 0.05 % MT LIQD: 7.0000 mL | Freq: Two times a day (BID) | OROMUCOSAL | Status: DC

## 2014-08-08 MED ORDER — POTASSIUM CHLORIDE CRYS ER 20 MEQ PO TBCR: 40.0000 meq | EXTENDED_RELEASE_TABLET | Freq: Every day | ORAL | Status: DC

## 2014-08-08 MED ORDER — HYDROCODONE-ACETAMINOPHEN 5-325 MG PO TABS: 1.0000 | ORAL_TABLET | Freq: Four times a day (QID) | ORAL | Status: DC | PRN

## 2014-08-08 MED ORDER — HYDROCODONE-ACETAMINOPHEN 5-325 MG PO TABS
1.0000 | ORAL_TABLET | Freq: Four times a day (QID) | ORAL | Status: DC | PRN
  Administered 2014-08-08 (×2): 2 via ORAL
  Filled 2014-08-08 (×2): qty 2

## 2014-08-08 MED ORDER — FUROSEMIDE 80 MG PO TABS: 80.0000 mg | ORAL_TABLET | Freq: Two times a day (BID) | ORAL | Status: DC

## 2014-08-08 MED ORDER — HYDROCODONE-ACETAMINOPHEN 5-325 MG PO TABS
1.0000 | ORAL_TABLET | Freq: Once | ORAL | Status: AC
  Administered 2014-08-08: 1 via ORAL
  Filled 2014-08-08: qty 1

## 2014-08-08 MED ORDER — FUROSEMIDE 20 MG PO TABS: 60.0000 mg | ORAL_TABLET | Freq: Two times a day (BID) | ORAL | Status: DC

## 2014-08-08 MED ORDER — WHITE PETROLATUM GEL
Status: AC
  Administered 2014-08-08: 0.2
  Filled 2014-08-08: qty 5

## 2014-08-08 MED ORDER — HYDROCODONE-ACETAMINOPHEN 5-325 MG PO TABS: 1.0000 | ORAL_TABLET | ORAL | Status: DC | PRN

## 2014-08-08 NOTE — Progress Notes (Signed)
Physical Therapy Treatment Patient Details Name: [name] MRN: [id number] DOB: 10/23/1931 Today's Date: 08/08/2014    History of Present Illness Pt. was admitted 08/06/14 with generalized weakness and K+ of 2.8 in the ED.[name]  Pt. with history of chronic diastolic heart failure, pulmonary HTN, 2:1 AV block with pacer    PT Comments    Pt continues to require extensive A for all aspects of mobility.  Pt would benefit from SNF level of care at D/C, however pt is wanting to return to home and since pt is observation status it appears she would not receive insurance coverage for SNF per [facility] note.  Pt will need 24hr care if returns to home.    Follow Up Recommendations  SNF     Equipment Recommendations  None recommended by PT    Recommendations for Other Services       Precautions / Restrictions Precautions Precautions: Fall Restrictions Weight Bearing Restrictions: No    Mobility  Bed Mobility Overal bed mobility: Needs Assistance Bed Mobility: Supine to Sit     Supine to sit: Mod assist     General bed mobility comments: cues for encouragement and sequencing.    Transfers Overall transfer level: Needs assistance Equipment used: 2 person hand held assist Transfers: Sit to/from [employer] Sit to Stand: Max assist;+2 physical assistance Stand pivot transfers: Max assist;+2 physical assistance       General transfer comment: Strong cues for safe technique as pt wanting to reach out for chair and other objects.  Utilized pad under hips to A with coming to stand and controlling pivot to chair.  pt does need Bil feet blocked and facilitation for anterior wt shifting when coming to stand.    Ambulation/Gait                 Stairs            Wheelchair Mobility    Modified Rankin (Stroke Patients Only)       Balance Overall balance assessment: Needs assistance Sitting-balance support: No upper extremity supported;Feet  supported Sitting balance-Leahy Scale: Fair     Standing balance support: During functional activity Standing balance-Leahy Scale: Zero                      Cognition Arousal/Alertness: Awake/alert Behavior During Therapy: WFL for tasks assessed/performed Overall Cognitive Status: No family/caregiver present to determine baseline cognitive functioning                      Exercises      General Comments        Pertinent Vitals/Pain Pain Assessment: 0-10 Pain Score: 7  Pain Location: R foot Pain Descriptors / Indicators: Stabbing;Sore Pain Intervention(s): Repositioned;Premedicated before session    Home Living                      Prior Function            PT Goals (current goals can now be found in the care plan section) Acute Rehab PT Goals PT Goal Formulation: With patient Time For Goal Achievement: 08/21/14 Potential to Achieve Goals: Fair Progress towards PT goals: Progressing toward goals    Frequency  Min 3X/week    PT Plan Current plan remains appropriate    Co-evaluation             End of Session Equipment Utilized During Treatment: Gait belt Activity Tolerance: Patient limited  by pain;Patient limited by fatigue Patient left: in chair;with call bell/phone within reach;with nursing/sitter in room     Time: 7867-5449 PT Time Calculation (min): 16 min  Charges:  $Therapeutic Activity: 8-22 mins                    G CodesSunny Schlein, [place] [phone] 08/08/2014, 3:07 PM

## 2014-08-08 NOTE — Progress Notes (Signed)
Patient discharge teaching given, including activity, diet, follow-up appoints, and medications. Patient verbalized understanding of all discharge instructions. IV access was d/c'd. Vitals are stable. Skin is intact except as charted in most recent assessments. Pt to be escorted out by NT, to be driven home by family. 

## 2014-08-08 NOTE — Progress Notes (Signed)
On call physician notified that prn PO pain medicine is not working and that pt request additional pain pill.

## 2014-08-08 NOTE — Discharge Summary (Signed)
Physician Discharge Summary  DEAN BUENAFE IRJ:[id number] DOB: 1931/06/27 DOA: 08/06/2014  PCP: Alice Reichert, MD  Admit date: 08/06/2014 Discharge date: 08/08/2014  Time spent:40 minutes  Recommendations for Outpatient Follow-up:  1. Follow up BMET in 3 days to recheck potassium. 2. Follow up Dr. Tressa Busman, cardiologist about management of CHF and medication adjustment. 3. Home health PT.  Discharge Diagnoses:  Principal Problem:   Hypokalemia Active Problems:   Edema   Pulmonary hypertension   Hypertension   Chronic diastolic CHF (congestive heart failure)   Generalized weakness   Protein-calorie malnutrition, moderate   Discharge Condition: Stable  Diet recommendation: Heart healthy  Filed Weights   08/06/14 1931  Weight: 76.431 kg (168 lb 8 oz)    History of present illness:    Hospital Course:   Generalized Weakness -Due to hypokalemia secondary to diuretics use. Patient was on Lasix and Metolazone.  Presented with generalized weakness that has resolved on discharge. Repleted potassium oral supplements. Potassium levels elevated mildly, but on discharge, levels are within normal limits. Workup with UA and CXR was negative for any other infectious etiologies.  -On discharge, discontinued Metolazone.  Lasix dose decreased to 60 mg PO BID.  -Prescribed potassium-take 40 meq KDUR PO once daily to prevent potassium depletion secondary to Lasix use. -PT/OT recommends SNF due to increased risk fall and for therapy with ambulation, but patient declines to go and states wants to go home, will discharge home with home health PT -Follow up with Dr. Gilles Chiquito for CHF management and medication adjustment  Hypertension  -Stable on discharge. -On discharge, discontinue Metolazone. Take Lasix 60 mg PO BID. -follow up with PCP for management of HTN  Chronic diastolic CHF  -Stable on discharge- seems to be euvolemic with clear bilateral lungs and no LE edema -echo 2012 -EF 65-70%;  Pacemaker working well- according to Dr. Marissa Nestle notes -Follow up with Dr.  Gilles Chiquito for  management of CHF  Chronic skin cha Procedures:  None  Consultations:  None  Discharge Exam: Filed Vitals:   08/08/14 0541  BP: 134/56  Pulse: 88  Temp: 98.8 F (37.1 C)  Resp: 20     Exam General: Alert and oriented AA female in NAD. Laying comfortably in bed Eyes: Anicteric account.  Cardiovascular: Regular rate and rhythm.  No murmurs, rubs, or gallops. Respiratory: Clear to auscultate bilaterally.  No rhonchi or crepitations. Abdomen: Soft nontender bowel sounds present. No guarding or rigidity.  Musculoskeletal: No edema. BLE with chronic skin changes Psychiatric: Appears normal.  Neurologic: Alert awake oriented to time place and person.    Discharge Instructions      Medication List    STOP taking these medications       metolazone 2.5 MG tablet  Commonly known as:  ZAROXOLYN      TAKE these medications       cyclobenzaprine 5 MG tablet  Commonly known as:  FLEXERIL  Take 5 mg by mouth 2 (two) times daily as needed. For muscle spasm.     furosemide 20 MG tablet  Commonly known as:  LASIX  Take 3 tablets (60 mg total) by mouth 2 (two) times daily.     magnesium oxide 400 MG tablet  Commonly known as:  MAG-OX  Take 1 tablet (400 mg total) by mouth daily.     meloxicam 15 MG tablet  Commonly known as:  MOBIC  Take 15 mg by mouth daily as needed for pain.     multivitamins ther. w/minerals Tabs  tablet  Take 1 tablet by mouth daily.     potassium chloride SA 20 MEQ tablet  Commonly known as:  K-DUR,KLOR-CON  Take 2 tablets (40 mEq total) by mouth daily.     Vitamin D3 1000 UNITS Caps  Take 1 capsule by mouth daily.       Allergies  Allergen Reactions  . Aspirin     REACTION: rash       Follow-up Information   Follow up with Alice Reichert, MD. Schedule an appointment as soon as possible for a visit in 3 days.   Specialty:  Family Medicine    Contact information:   [address] [place] [address] [phone]        The results of significant diagnostics from this hospitalization (including imaging, microbiology, ancillary and laboratory) are listed below for reference.    Significant Diagnostic Studies: Dg Chest 2 View  08/06/2014   CLINICAL DATA:  Fall, weakness  EXAM: CHEST  2 VIEW  COMPARISON:  Chest radiograph 04/21/2014  FINDINGS: Pacer apparatus overlies the left hemi thorax, leads are stable in position. Stable cardiac mediastinal contours. Persistent elevation of the right hemidiaphragm. No consolidative pulmonary opacities. No pleural effusion or pneumothorax. Bilateral glenohumeral joint degenerative changes.  IMPRESSION: No acute cardiopulmonary process.   Electronically Signed   By: Annia Belt M.D.   On: 08/06/2014 15:53    Microbiology: No results found for this or any previous visit (from the past 240 hour(s)).   Labs: Basic Metabolic Panel:  Recent Labs Lab 08/06/14 1300 08/07/14 0735 08/08/14 0630  NA 139 141 142  K 2.8* 5.7* 4.0  CL 96 107 104  CO2 GLUCOSE 112* 79 96  BUN CREATININE 0.83 0.90 0.87  CALCIUM 9.8 8.9 8.6  MG 1.6  --   --    Liver Function Tests: No results found for this basename: AST, ALT, ALKPHOS, BILITOT, PROT, ALBUMIN,  in the last 168 hours No results found for this basename: LIPASE, AMYLASE,  in the last 168 hours No results found for this basename: AMMONIA,  in the last 168 hours CBC:  Recent Labs Lab 08/06/14 1300  WBC 9.8  NEUTROABS 7.8*  HGB 13.5  HCT 39.1  MCV 84.8  PLT 267   Cardiac Enzymes:  Recent Labs Lab 08/06/14 1300  TROPONINI <0.30   BNP: BNP (last 3 results)  Recent Labs  04/21/14 1742  PROBNP 1164.0*   CBG: No results found for this basename: GLUCAP,  in the last 168 hours     Signed:  Illa Level PA-C  [facility] 08/08/2014, 11:25 AM  Attending  Patient was seen, examined,treatment plan  was discussed with the Physician extender. I have directly reviewed the clinical findings, lab, imaging studies and management of this patient in detail. I have made the necessary changes to the above noted documentation, and agree with the documentation, as recorded by the Physician extender.   Huey Bienenstock MD  Pager [phone]  [facility].

## 2014-08-08 NOTE — Discharge Instructions (Signed)
CALL YOUR PCP AND SCHEDULE AN APPOINTMENT IN 3 DAYS TO RECHECK YOUR POTASSIUM LEVELS

## 2014-08-08 NOTE — Progress Notes (Signed)
CSW (Clinical Child psychotherapist) spoke with [facility] and aware that pt is observation status and therefore does not have a qualifying stay for Medicare benefits to cover SNF. RNCM to speak with pt and arrange home services. Pt has no hospital social work needs at this time.  Yemaya Barnier, LCSWA [phone]

## 2014-08-08 NOTE — Progress Notes (Signed)
Patient asked me to wait to go over discharge paperwork when her son got there. He is supposed to be here to pick her up around 6:00 or a little after.

## 2014-08-08 NOTE — Evaluation (Signed)
Occupational Therapy Evaluation Patient Details Name: [name] MRN: [id number] DOB: [birth year] Today's Date: 08/08/2014    History of Present Illness Pt. was admitted 08/06/14 with generalized weakness and K+ of 2.8 in the ED.[name]  Pt. with history of chronic diastolic heart failure, pulmonary HTN, 2:1 AV block with pacer   Clinical Impression   Pt.l is requiring extensive A with mobility and ADLs currently. Pt. Current level of A at home is not able to meet current level of needs. Pt. Is agreeable to ST SNF for rehab is she is not at baseline when ready to d/c from hospital. Pt. Is currently not able to come into full stand with one person A secondary to LE pain. Acute OT to follow to achieve maximum potential at d/c to decrease caregiver burden.     Follow Up Recommendations  SNF    Equipment Recommendations  None recommended by OT    Recommendations for Other Services       Precautions / Restrictions Precautions Precautions: Fall;Other (comment) Precaution Comments: pt. states one recent fall at home while attempting to stand from her lift recliner chair Restrictions Weight Bearing Restrictions: No      Mobility Bed Mobility Overal bed mobility: Needs Assistance Bed Mobility: Supine to Sit;Sit to Supine     Supine to sit: Min assist Sit to supine: Min assist;Mod assist      Transfers                 General transfer comment:  (Pt. uanble to come to full stand. )    Balance                                            ADL Overall ADL's : Needs assistance/impaired Eating/Feeding: Independent   Grooming: Wash/dry hands;Wash/dry face;Set up   Upper Body Bathing: Set up   Lower Body Bathing: Moderate assistance   Upper Body Dressing : Minimal assistance   Lower Body Dressing: Maximal assistance   Toilet Transfer: Total assistance;+2 for physical assistance   Toileting- Clothing Manipulation and Hygiene: Total assistance;+2 for  physical assistance         General ADL Comments:  (Pt. is currentlyl req;uiring extensive A.)     Vision                     Perception     Praxis      Pertinent Vitals/Pain Pain Assessment: 0-10 Pain Score: 7  Pain Location:  (R foot when standing.) Pain Descriptors / Indicators: Stabbing Pain Intervention(s): Limited activity within patient's tolerance     Hand Dominance     Extremity/Trunk Assessment Upper Extremity Assessment Upper Extremity Assessment: Generalized weakness           Communication Communication Communication: No difficulties   Cognition Arousal/Alertness: Awake/alert Behavior During Therapy: WFL for tasks assessed/performed Overall Cognitive Status: Within Functional Limits for tasks assessed                     General Comments       Exercises       Shoulder Instructions      Home Living Family/patient expects to be discharged to:: Private residence Living Arrangements: Alone Available Help at Discharge: Personal care attendant;Family;Available PRN/intermittently Type of Home: House Home Access: Stairs to enter [employer] of Steps: 3 Entrance Stairs-Rails: Right Home  Layout: One level     Bathroom Shower/Tub: Tub/shower unit;Curtain Shower/tub characteristics: Engineer, building services: Standard Bathroom Accessibility: Yes   Home Equipment: Environmental consultant - 2 wheels;Cane - single point;Bedside commode;Shower seat;Wheelchair - manual          Prior Functioning/Environment Level of Independence: Independent with assistive device(s)        Comments: reports she is normally independent with RW until onset of generalized weakness    OT Diagnosis: Generalized weakness;Acute pain   OT Problem List: Decreased strength;Decreased activity tolerance;Decreased knowledge of use of DME or AE   OT Treatment/Interventions: Self-care/ADL training;DME and/or AE instruction;Therapeutic activities    OT  Goals(Current goals can be found in the care plan section) Acute Rehab OT Goals Patient Stated Goal:  (Pt. wants to d/c home but knows that she is not able to curr) OT Goal Formulation: With patient Time For Goal Achievement: 08/22/14 Potential to Achieve Goals: Good ADL Goals Pt Will Perform Lower Body Bathing: with supervision Pt Will Perform Upper Body Dressing: with supervision Pt Will Perform Lower Body Dressing: with supervision Pt Will Transfer to Toilet: with supervision Pt Will Perform Toileting - Clothing Manipulation and hygiene: with supervision  OT Frequency: Min 2X/week   Barriers to D/C: Decreased caregiver support          Co-evaluation              End of Session    Activity Tolerance: Patient tolerated treatment well Patient left: in bed;with call bell/phone within reach;with bed alarm set   Time: 1610-9604 OT Time Calculation (min): 70 min Charges:    G-Codes:    Jahn Franchini 08/17/14, 9:13 AM

## 2014-08-11 ENCOUNTER — Telehealth: Payer: Self-pay | Admitting: Cardiology

## 2014-08-11 DIAGNOSIS — I5032 Chronic diastolic (congestive) heart failure: Secondary | ICD-10-CM

## 2014-08-11 DIAGNOSIS — Z9181 History of falling: Secondary | ICD-10-CM | POA: Diagnosis not present

## 2014-08-11 DIAGNOSIS — E876 Hypokalemia: Secondary | ICD-10-CM | POA: Diagnosis not present

## 2014-08-11 DIAGNOSIS — Z5181 Encounter for therapeutic drug level monitoring: Secondary | ICD-10-CM | POA: Diagnosis not present

## 2014-08-11 DIAGNOSIS — E440 Moderate protein-calorie malnutrition: Secondary | ICD-10-CM | POA: Diagnosis not present

## 2014-08-11 DIAGNOSIS — I272 Other secondary pulmonary hypertension: Secondary | ICD-10-CM | POA: Diagnosis not present

## 2014-08-11 NOTE — Telephone Encounter (Signed)
Per The pts daughter, [name], request the pt will have potassium level drawn on Friday 9/25. Lab ordered and appointment scheduled to have drawn.

## 2014-08-11 NOTE — Telephone Encounter (Signed)
New problem   Pt was discharged from hospital last week and was told to call office to get her potassium checked. Please call pt's daughter.

## 2014-08-13 ENCOUNTER — Telehealth: Payer: Self-pay | Admitting: Cardiology

## 2014-08-15 ENCOUNTER — Other Ambulatory Visit: Payer: Medicare Other

## 2014-08-15 ENCOUNTER — Telehealth: Payer: Self-pay | Admitting: Cardiology

## 2014-08-15 NOTE — Telephone Encounter (Signed)
The pts daughter states that [facility] came to the pts home on Monday 08/11/14 and obtained a BMET. She is advised that the pt does not need to come to the office for labs today as only the BMET was ordered to be drawn 3 day after hospital d/c. She verbalized understanding.

## 2014-08-15 NOTE — Telephone Encounter (Signed)
°  Patients daughter wants to know if you go the report that you needed? If you did get the report does she needs to still bring her mother in today? Please call and advise.

## 2014-08-22 NOTE — Progress Notes (Addendum)
OT Note - Addendum   08/30/14 0805  OT Visit Information  Last OT Received On 2014/08/30  OT G-codes **NOT FOR INPATIENT CLASS**  Functional Assessment Tool Used clinical judgement  Functional Limitation Self care  Self Care Current Status (P7106) CM  Self Care Goal Status (Y6948) CI  [facility], OTR/L  [phone] 30-Aug-2014

## 2014-08-25 DIAGNOSIS — I10 Essential (primary) hypertension: Secondary | ICD-10-CM | POA: Diagnosis not present

## 2014-08-25 DIAGNOSIS — E876 Hypokalemia: Secondary | ICD-10-CM | POA: Diagnosis not present

## 2014-08-25 DIAGNOSIS — R0609 Other forms of dyspnea: Secondary | ICD-10-CM | POA: Diagnosis not present

## 2014-08-25 DIAGNOSIS — M1990 Unspecified osteoarthritis, unspecified site: Secondary | ICD-10-CM | POA: Diagnosis not present

## 2014-08-25 NOTE — Telephone Encounter (Signed)
**Note De-Identified [name] Obfuscation** Pt in Hospital from 9/16 until 9/18 for generalized weakness. 08/11/14  Weight= 174 lbs.  The pt c/o mild bilateral leg, ankle and feet edema and no SOB at this time.  08/14/14  Weight= 174 lbs.  The pt reports that she has mild ankle edema only and no sob at this time. 08/15/14  Weight= 171 lbs.  The pt states that she has edema in her feet and no SOB at this time. 08/18/14  Weight=  ? (pt states that no one weighed her today) The pt states that her ankles are better today and that she has no SOB at this time.

## 2014-09-01 ENCOUNTER — Telehealth: Payer: Self-pay

## 2014-09-01 NOTE — Telephone Encounter (Signed)
**Note De-Identified [name] Obfuscation** 08/21/14  Weight= 170 lbs.  The pt states that she has mild left ankle edema and no sob at this time. 08/22/14  Weight= 171 lbs.  The pt reports no changes in her left ankle edema or sob at this time. 08/26/14  Weight= 171 lbs.  The pt reports no changes in her left ankle edema or sob at this time. 08/29/14  Weight=  172 lbs. The pt reports no changes in her left ankle edema or sob at this time. She states hat she did take a Zaroxolyn dose today due to weight of 172 lbs. 09/01/14 Weight= 171 lbs.  The pt states that the edema in her left ankle has improved and that she only has mild swelling and no SOB at this time.

## 2014-09-03 ENCOUNTER — Encounter: Payer: Self-pay | Admitting: Cardiology

## 2014-09-03 ENCOUNTER — Other Ambulatory Visit: Payer: Self-pay

## 2014-09-03 ENCOUNTER — Ambulatory Visit (INDEPENDENT_AMBULATORY_CARE_PROVIDER_SITE_OTHER): Payer: Medicare Other | Admitting: Cardiology

## 2014-09-03 ENCOUNTER — Telehealth: Payer: Self-pay

## 2014-09-03 VITALS — BP 122/70 | HR 74 | Temp 97.3°F | Ht 60.0 in | Wt 169.0 lb

## 2014-09-03 DIAGNOSIS — E876 Hypokalemia: Secondary | ICD-10-CM | POA: Diagnosis not present

## 2014-09-03 DIAGNOSIS — I5032 Chronic diastolic (congestive) heart failure: Secondary | ICD-10-CM

## 2014-09-03 LAB — BASIC METABOLIC PANEL
BUN: 21 mg/dL (ref 6–23)
CO2: 29 mEq/L (ref 19–32)
Calcium: 9.7 mg/dL (ref 8.4–10.5)
Chloride: 98 mEq/L (ref 96–112)
Creatinine, Ser: 1 mg/dL (ref 0.4–1.2)
GFR: 71.77 mL/min (ref >60.00)
GLUCOSE: 79 mg/dL (ref 70–99)
Potassium: 3.5 mEq/L (ref 3.5–5.1)
Sodium: 135 mEq/L (ref 135–145)

## 2014-09-03 MED ORDER — HYDROCODONE-ACETAMINOPHEN 5-325 MG PO TABS: 1.0000 | ORAL_TABLET | Freq: Four times a day (QID) | ORAL | Status: DC | PRN

## 2014-09-03 MED ORDER — POTASSIUM CHLORIDE CRYS ER 20 MEQ PO TBCR: 60.0000 meq | EXTENDED_RELEASE_TABLET | Freq: Every day | ORAL | Status: DC

## 2014-09-03 MED ORDER — METOLAZONE 2.5 MG PO TABS: ORAL_TABLET | ORAL | Status: DC

## 2014-09-03 NOTE — Progress Notes (Signed)
Patient ID: [name], female   DOB: 10-25-1931, 80 y.o.   MRN: [id number]    HPI  Patient is seen today to followup chronic diastolic CHF. We contact her on a regular basis at home to try to help with her diuretic dosing. She was admitted to the hospital from September 16 through August 08 2014. Her diagnosis was CHF and hypokalemic anemia. Her meds were adjusted and she was sent home. Her home weight now is relatively stable for her. She has some mild edema.  Allergies  Allergen Reactions  . Aspirin Rash and Other (See Comments)    GI distress  . Tramadol Other (See Comments)    GI distress    Current Outpatient Prescriptions  Medication Sig Dispense Refill  . Cholecalciferol (VITAMIN D3) 3000 UNITS TABS Take 1 capsule by mouth daily.      . cyclobenzaprine (FLEXERIL) 5 MG tablet Take 5 mg by mouth 2 (two) times daily as needed. For muscle spasm.      . furosemide (LASIX) 20 MG tablet Take 3 tablets (60 mg total) by mouth 2 (two) times daily.  180 tablet  0  . magnesium oxide (MAG-OX) 400 MG tablet Take 1 tablet (400 mg total) by mouth daily.  30 tablet  6  . meloxicam (MOBIC) 15 MG tablet Take 15 mg by mouth daily as needed for pain.       . Multiple Vitamins-Minerals (MULTIVITAMINS THER. W/MINERALS) TABS Take 1 tablet by mouth daily.        . potassium chloride SA (K-DUR,KLOR-CON) 20 MEQ tablet Take 2 tablets (40 mEq total) by mouth daily.  60 tablet  0  . HYDROcodone-acetaminophen (NORCO/VICODIN) 5-325 MG per tablet Take 1 tablet by mouth every 6 (six) hours as needed for severe pain (1 for mod pain, 2 for severe pain).  20 tablet  0  . metolazone (ZAROXOLYN) 2.5 MG tablet Take 1 tablet daily as needed when weight is greater than 172 lbs.  30 tablet  1   No current facility-administered medications for this visit.    History   Social History  . Marital Status: Widowed    Spouse Name: N/A    Number of Children: N/A  . Years of Education: N/A   Occupational History    . Retired    Social History Main Topics  . Smoking status: Never Smoker   . Smokeless tobacco: Never Used  . Alcohol Use: No  . Drug Use: No  . Sexual Activity: No   Other Topics Concern  . Not on file   Social History Narrative   Retired    Widowed    Tobacco Use - No.    Alcohol Use - no   Regular Exercise - no   Drug Use - no    Family History  Problem Relation Age of Onset  . Cardiomyopathy Father   . Diabetes Father   . Heart failure Father   . Cancer       son  . Coronary artery disease      son  . Dementia Mother     Past Medical History  Diagnosis Date  . Edema     . EF 65-70%, echo, November 29, 2010  . Pulmonary hypertension      46 mmHg... echo... January, 2012  . Cellulitis     Superficial cellulitis right lower leg   January 14, 2011  . AV block      2:1  January 14, 2011 / pacemaker  plan when cellulitis resolved in length  . Bradycardia     2:1  AV block  . Hypokalemia     October, 2012  . Ejection fraction     EF 65%, echo, January, 2012  . Murmur     No significant valvular disease by echo, January, 2012  . Pacemaker     January, 2013  . Chronic diastolic CHF (congestive heart failure)   . Hypertension   . Arthritis     "qwhere"    Past Surgical History  Procedure Laterality Date  . Hip arthroplasty Right   . Vaginal hysterectomy      "partial"  . Cholecystectomy    . Insert / replace / remove pacemaker  11/2011    Patient Active Problem List   Diagnosis Date Noted  . Generalized weakness 08/06/2014  . Protein-calorie malnutrition, moderate 08/06/2014  . Osteoarthrosis, unspecified whether generalized or localized, unspecified site 04/29/2014  . Anxiety 04/28/2014  . Neck pain 04/28/2014  . Weakness 04/21/2014  . FTT (failure to thrive) in adult 04/21/2014  . Chronic diastolic CHF (congestive heart failure)   . Pacemaker   . Hypertension 12/10/2011  . Cardiac pacemaker 12/10/2011  . Murmur   . Hypokalemia   . Ejection  fraction   . Edema   . AV block   . Cellulitis   . Pulmonary hypertension     ROS   Patient denies fever, chills, headache, sweats, rash, change in vision, change in hearing, chest pain, cough, nausea vomiting, urinary symptoms. All other systems are reviewed and are negative.  PHYSICAL EXAM  Patient is overweight. She is oriented to person time and place. Affect is normal. She is in a wheelchair. She is here with a family member. Head is atraumatic. Lungs are clear. Respiratory effort is nonlabored. Cardiac exam reveals S1 and S2. The abdomen is protuberant but soft. She has 1+ bilateral peripheral edema.  Filed Vitals:   09/03/14 1459  BP: 122/70  Pulse: 74  Temp: 97.3 F (36.3 C)  Height: 5' (1.524 m)  Weight: 169 lb (76.658 kg)  SpO2: 96%    ASSESSMENT & PLAN

## 2014-09-03 NOTE — Telephone Encounter (Signed)
Per Dr Myrtis Ser and due to the pts Potassium level of 3.5 the pt is advised to increase Potassium dose to 60 meq daily and to repeat BMET in 3 weeks. The pt verbalized understanding and agrees with plan. RX sent to [facility] to fill per the pts request and BMET ordered and scheduled to be drawn on 09/24/14, the pt is aware.

## 2014-09-03 NOTE — Patient Instructions (Addendum)
Your physician recommends that you continue on your current medications as directed. Please refer to the Current Medication list given to you today.  Your physician recommends that you return for lab work in: today-BMET    Your physician wants you to follow-up in: 3 months. You will receive a reminder letter in the mail two months in advance. If you don't receive a letter, please call our office to schedule the follow-up appointment.

## 2014-09-03 NOTE — Assessment & Plan Note (Signed)
Her weight seems stable at this point. She has mild edema. Her current diuretic dosing is Lasix 60 twice a day. She is receiving some potassium. Her Zaroxolyn had been stopped by report in the hospital. However she is taking it at home on a when necessary basis for adjustment of her weight. I am in agreement with this. This is the only way to keep her out of the hospital for volume overload. Chemistry is being checked today. As soon as we have the result we will adjust her medications appropriately.

## 2014-09-03 NOTE — Assessment & Plan Note (Signed)
This has been a significant problem for her. Of course the diuretics play a significant role. We need to continue to dose her with potassium and be sure that she's taking it

## 2014-09-09 ENCOUNTER — Other Ambulatory Visit: Payer: Self-pay | Admitting: *Deleted

## 2014-09-09 MED ORDER — FUROSEMIDE 20 MG PO TABS: 60.0000 mg | ORAL_TABLET | Freq: Two times a day (BID) | ORAL | Status: DC

## 2014-09-19 NOTE — Telephone Encounter (Signed)
09/03/14  Weight= 169 lbs. (OV weight) The pt states that the edema in her left ankle is better today and that she has no sob at this time. No changes were made at todays visit. 09/15/14  Weight= 170 lbs. The pt states that she has mild edema in her feet and ankles today and no SOB at this time. 09/17/14  Weight= 171 lbs.  The pt reports no changes in her edema or SOB today. 09/19/14  Weight= 171 lbs.  The pt states that she has mild edema in her feet and no SOB at this time.

## 2014-09-24 ENCOUNTER — Other Ambulatory Visit (INDEPENDENT_AMBULATORY_CARE_PROVIDER_SITE_OTHER): Payer: Medicare Other | Admitting: *Deleted

## 2014-09-24 DIAGNOSIS — I5032 Chronic diastolic (congestive) heart failure: Secondary | ICD-10-CM | POA: Diagnosis not present

## 2014-09-24 LAB — BASIC METABOLIC PANEL
BUN: 25 mg/dL — ABNORMAL HIGH (ref 6–23)
CO2: 26 mEq/L (ref 19–32)
CREATININE: 1 mg/dL (ref 0.4–1.2)
Calcium: 9.4 mg/dL (ref 8.4–10.5)
Chloride: 101 mEq/L (ref 96–112)
GFR: 68.46 mL/min (ref >60.00)
Glucose, Bld: 101 mg/dL — ABNORMAL HIGH (ref 70–99)
Potassium: 3.8 mEq/L (ref 3.5–5.1)
Sodium: 137 mEq/L (ref 135–145)

## 2014-10-02 ENCOUNTER — Telehealth: Payer: Self-pay | Admitting: Cardiology

## 2014-10-02 NOTE — Telephone Encounter (Signed)
Pt is aware that [name] gave pt the lab results, and they were normal. Daughter verbalized understanding.

## 2014-10-02 NOTE — Telephone Encounter (Signed)
PT'S DAUGHTER WANTS TO TALK TO NURSE RE LABS

## 2014-10-06 ENCOUNTER — Other Ambulatory Visit: Payer: Self-pay | Admitting: Cardiology

## 2014-10-07 DIAGNOSIS — J029 Acute pharyngitis, unspecified: Secondary | ICD-10-CM | POA: Diagnosis not present

## 2014-10-07 DIAGNOSIS — J209 Acute bronchitis, unspecified: Secondary | ICD-10-CM | POA: Diagnosis not present

## 2014-10-08 ENCOUNTER — Telehealth: Payer: Self-pay | Admitting: Cardiology

## 2014-10-08 NOTE — Telephone Encounter (Signed)
The pt called to report her weight for today of 171 lbs. I have recorded her weight.

## 2014-10-08 NOTE — Telephone Encounter (Signed)
New Msg    Patient returning call and would like to be contacted at home number.

## 2014-10-10 ENCOUNTER — Other Ambulatory Visit: Payer: Self-pay

## 2014-10-10 ENCOUNTER — Telehealth: Payer: Self-pay

## 2014-10-10 MED ORDER — POTASSIUM CHLORIDE CRYS ER 20 MEQ PO TBCR: 60.0000 meq | EXTENDED_RELEASE_TABLET | Freq: Every day | ORAL | Status: DC

## 2014-10-10 NOTE — Telephone Encounter (Addendum)
09/22/14  Weight= 171 lbs. The pt states that she has mild edema in her feet and ankles and no sob at this time. 09/26/14  Weight= 172 lbs. The pt states that she has mild edema in her feet and no sob at this time. 09/29/14  Weight= 169 lbs.  The pt reports that she has mild edema in her ankles and no SOB at this time. 09/30/14  Weight= 170 lbs. The pt reports no changes in her edema or SOB at this time. 10/06/14  Weight= 170 lbs.  The pt reports that she has mild edema in her ankles and no SOB at this time. 10/08/14  Weight= 171 lbs.  The pt reports that she has mild edema in her ankles and no SOB at this time. 10/10/14  Weight= 171 lbs. The pt states that she has mild left ankle edema and no SOB at this time.

## 2014-10-21 NOTE — Telephone Encounter (Signed)
**Note De-Identified [name] Obfuscation** 10/13/14 Weight= 170 lbs  The pt states that she has mild edema in her left foot and no SOB at this time. 10/14/14 Weight= 172 lbs  The pt reports no changes in her edema or sob at this time. The pt took a Zaroxolyn dose today due to weight of 172 lbs. 10/20/14 Weight= 170 lbs  The pt states that she has mild edema in her feet and no SOB at this time.

## 2014-10-30 ENCOUNTER — Other Ambulatory Visit: Payer: Self-pay

## 2014-10-30 ENCOUNTER — Encounter (HOSPITAL_COMMUNITY): Payer: Self-pay | Admitting: Internal Medicine

## 2014-10-30 MED ORDER — FUROSEMIDE 20 MG PO TABS: 60.0000 mg | ORAL_TABLET | Freq: Two times a day (BID) | ORAL | Status: DC

## 2014-11-02 ENCOUNTER — Other Ambulatory Visit: Payer: Self-pay | Admitting: Cardiology

## 2014-11-10 ENCOUNTER — Telehealth: Payer: Self-pay | Admitting: Cardiology

## 2014-11-10 ENCOUNTER — Telehealth: Payer: Self-pay

## 2014-11-10 NOTE — Telephone Encounter (Addendum)
**Note De-Identified Lashon Hillier Obfuscation** The pts daughter, [name], states that the pt will be staying with her for about 3 to 4 weeks (through the holidays) and that they we begin weighing her there as soon as they get here scales set up. She states that I may call the pt daily at [phone] (rosa's home #). [name] is advised that I will only be in the office today Monday 12/21 for this week and only Monday 12/28 next week and that if the pt has a significant weight gain, edema or SOB to call the office and speak to a triage nurse. [name] verbalized good understanding.

## 2014-11-10 NOTE — Telephone Encounter (Signed)
New Message  Pt's daughter requests to speak with Rn; Please call back and discuss.

## 2014-11-10 NOTE — Telephone Encounter (Signed)
10/21/14  Weight= 172 lbs  The pt states that she has mild edema in her left and that she has no sob at this time. The pt took a Zaroxolyn dose today due to weight of 172. 10/24/14  Weight= 170 lbs  The pt reports mild edema in her ankles and no sob at this time. 10/28/14  Weight= 171 lbs  The pt reports mild edema in her left foot and no edema at this time. 10/29/14  Weight= 172 lbs  The pt states that she has mild edema in her left and that she has no sob at this time. The pt took a Zaroxolyn dose today due to weight of 172. 10/30/14  Weight= 171 lbs  The pt reports no changes in her left foot edema and that she has no sob at this time.

## 2014-11-17 NOTE — Telephone Encounter (Signed)
The pt was unable to weigh herself without help so we have no weights from 12/10 through 12/25. The pt is currently staying with her daughter for the holidays and will return home in mid January according to her daughter, [name]. 11/14/14  Weight= 172 lbs.  The pt states that she has mild left ankle edema and no SOB at this time. She did take a Zaroxolyn dose today due to weight of 172 lbs. 11/15/14  Weight= 173 lbs.  The pt reports no changes in her edema or SOB at this time.  She did take a Zaroxolyn dose today due to weight greater than 172 lbs. 11/16/14  Weight= 170 lbs.  The pt reports no changes in her edema or SOB at this time. 11/17/14  Weight= 171 lbs.  The pt reports no changes in her edema or SOB at this time.

## 2014-12-08 ENCOUNTER — Telehealth: Payer: Self-pay | Admitting: *Deleted

## 2014-12-08 MED ORDER — METOLAZONE 2.5 MG PO TABS: ORAL_TABLET | ORAL | Status: DC

## 2014-12-08 NOTE — Telephone Encounter (Signed)
Refill request for metolazone 2.5 mg from [facility]. Pt has made appt for 12/29/14 with Dr. Myrtis Ser. Medication sent to pharmacy.

## 2014-12-11 ENCOUNTER — Telehealth: Payer: Self-pay

## 2014-12-11 NOTE — Telephone Encounter (Signed)
11/21/14  Weight= 170 lbs  The pt states that she has mild left ankle edema and no SOB at this time. 11/22/14  Weight= 168 lbs  The pt reports no changes in her left ankle edema or SOB at this time. 11/23/14  Weight= 166 lbs. The pt reports no changes in her left ankle edema or SOB at this time. 11/24/14  Weight= 175 lbs  The pt states that she has increased edema in her left ankle and no sob at this time. She says that she drank a Gatorade and ate spaghetti yesterday and feels that is why her weight and edema is up. She did take a Zaroxolyn dose due to weight greater than 172 lbs. 11/26/14  Weight= 171 lbs  The pt states that her left ankle edema is better today and that she has no SOB at this time. 11/27/14  Weight= 172 lbs  The pt reports no changes in her left ankle edema or SOB at this time. 11/28/14  Weight= 170 lbs  The pt reports no changes in her left ankle edema or SOB at this time. 12/01/14 Weight= 166 lbs   The pt reports no changes in her left ankle edema or SOB at this time. 12/03/14  Weight= 170 lbs. The pt reports no changes in her left ankle edema or SOB at this time. 12/05/14  Weight= 170 lbs.  The pt reports no changes in her left ankle edema or SOB at this time. 12/08/14  Weight= 172 lbs  The pt reports no changes in her left ankle edema or SOB at this time. She did take a Zaroxolyn dose due to weight of 172 lbs. 12/11/14  Weight= 170 lbs  The pt reports no changes in her left ankle edema or SOB at this time.

## 2014-12-19 ENCOUNTER — Encounter: Payer: Medicare Other | Admitting: Internal Medicine

## 2014-12-24 NOTE — Telephone Encounter (Signed)
**Note De-Identified Aalani Aikens Obfuscation** 12/15/14  Weight= 171 lbs.  The pt states that she has mild edema in her left ankle and no sob at this time. 12/17/14  Weight= 170 lbs.  The pt reports no changes in her edema or SOB at this time. 12/18/14  Weight= 172 lbs.  The pt states that she has mild edema in her left ankle and no sob at this time. She did take a Zaroxolyn dose today due to weight of 172 lbs.

## 2014-12-29 ENCOUNTER — Ambulatory Visit (INDEPENDENT_AMBULATORY_CARE_PROVIDER_SITE_OTHER): Payer: Medicare Other | Admitting: Cardiology

## 2014-12-29 ENCOUNTER — Encounter: Payer: Self-pay | Admitting: Cardiology

## 2014-12-29 VITALS — BP 160/72 | HR 100 | Ht 60.0 in | Wt 188.8 lb

## 2014-12-29 DIAGNOSIS — I5032 Chronic diastolic (congestive) heart failure: Secondary | ICD-10-CM

## 2014-12-29 DIAGNOSIS — Z950 Presence of cardiac pacemaker: Secondary | ICD-10-CM

## 2014-12-29 NOTE — Assessment & Plan Note (Signed)
The patient again has volume overload. When she gets too much swelling she gets redness and heat in the extremity. We have made a plan to have her take her Zaroxolyn every day for the next several days watching her weight carefully. Lab will be checked today. Our nursing team will communicate with her on a regular basis.

## 2014-12-29 NOTE — Assessment & Plan Note (Addendum)
Patient is scheduled to have a one-year pacemaker follow-up later this month.[name] KitchenMarland [name]

## 2014-12-29 NOTE — Patient Instructions (Signed)
Your physician has recommended you make the following change in your medication: start taking Zaroxolyn 2.5 mg daily until your weight is 5 pounds less than today's weight. Make sure you take with one of your lasix doses.  Your physician recommends that you return for lab work in: BMET today  Your physician recommends that you schedule a follow-up appointment in: 3 months

## 2014-12-29 NOTE — Progress Notes (Signed)
Cardiology Office Note   Date:  12/29/2014   ID:  [name], DOB May 17, 1931, MRN [id number]  PCP:  Alice Reichert, MD  Cardiologist:  Willa Rough, MD   Chief Complaint  Patient presents with  . Appointment    Follow-up chronic diastolic CHF      History of Present Illness: [name] is a 81 y.o. female who presents today to follow-up chronic diastolic CHF. It is very difficult to keep up with the patient's weight at home, even by her nursing team calling her on a very regular basis. It is not clear for scale as correct. She's feeling relatively well. However she does have more edema in her left lower extremity than at the time of the last visit. When she gets worsening edema she gets warmth and redness in that area. Other than that she is doing relatively well. She is not having chest pain or significant shortness of breath.    Past Medical History  Diagnosis Date  . Edema     . EF 65-70%, echo, November 29, 2010  . Pulmonary hypertension      46 mmHg... echo... January, 2012  . Cellulitis     Superficial cellulitis right lower leg   January 14, 2011  . AV block      2:1  January 14, 2011 / pacemaker plan when cellulitis resolved in length  . Bradycardia     2:1  AV block  . Hypokalemia     October, 2012  . Ejection fraction     EF 65%, echo, January, 2012  . Murmur     No significant valvular disease by echo, January, 2012  . Pacemaker     January, 2013  . Chronic diastolic CHF (congestive heart failure)   . Hypertension   . Arthritis     "qwhere"    Past Surgical History  Procedure Laterality Date  . Hip arthroplasty Right   . Vaginal hysterectomy      "partial"  . Cholecystectomy    . Insert / replace / remove pacemaker  11/2011  . Permanent pacemaker insertion N/A 12/09/2011    Procedure: PERMANENT PACEMAKER INSERTION;  Surgeon: Marinus Maw, MD;  Location: [facility] C[facility] LAB;  Service: Cardiovascular;  Laterality: N/A;    Patient Active Problem  List   Diagnosis Date Noted  . Generalized weakness 08/06/2014  . Protein-calorie malnutrition, moderate 08/06/2014  . Osteoarthrosis, unspecified whether generalized or localized, unspecified site 04/29/2014  . Anxiety 04/28/2014  . Neck pain 04/28/2014  . Weakness 04/21/2014  . FTT (failure to thrive) in adult 04/21/2014  . Chronic diastolic CHF (congestive heart failure)   . Pacemaker   . Hypertension 12/10/2011  . Cardiac pacemaker 12/10/2011  . Murmur   . Hypokalemia   . Ejection fraction   . Edema   . AV block   . Cellulitis   . Pulmonary hypertension       Current Outpatient Prescriptions  Medication Sig Dispense Refill  . Cholecalciferol (VITAMIN D3) 3000 UNITS TABS Take 1 capsule by mouth daily.    . cyclobenzaprine (FLEXERIL) 5 MG tablet Take 5 mg by mouth 2 (two) times daily as needed. For muscle spasm.    . furosemide (LASIX) 20 MG tablet Take 3 tablets (60 mg total) by mouth 2 (two) times daily. 180 tablet 5  . HYDROcodone-acetaminophen (NORCO/VICODIN) 5-325 MG per tablet Take 1 tablet by mouth every 6 (six) hours as needed for severe pain (1 for mod pain, 2  for severe pain). 20 tablet 0  . magnesium oxide (MAG-OX) 400 (241.3 MG) MG tablet TAKE 1 TABLET (400 MG TOTAL)  BY MOUTH DAILY. 30 tablet 5  . meloxicam (MOBIC) 15 MG tablet Take 15 mg by mouth daily as needed for pain.     . metolazone (ZAROXOLYN) 2.5 MG tablet Take 1 tab daily as needed 30 tablet 0  . Multiple Vitamins-Minerals (MULTIVITAMINS THER. W/MINERALS) TABS Take 1 tablet by mouth daily.      . potassium chloride SA (K-DUR,KLOR-CON) 20 MEQ tablet Take 3 tablets (60 mEq total) by mouth daily. 60 tablet 6   No current facility-administered medications for this visit.    Allergies:   Aspirin and Tramadol    Social History:  The patient  reports that she has never smoked. She has never used smokeless tobacco. She reports that she does not drink alcohol or use illicit drugs.   Family History:  The  patient's family history includes Cancer in an other family member; Cardiomyopathy in her father; Coronary artery disease in an other family member; Dementia in her mother; Diabetes in her father; Heart failure in her father.    ROS:  Please see the history of present illness.     Patient denies fever, chills, headache, sweats, rash, change in vision, change in hearing, chest pain, cough, nausea or vomiting, urinary symptoms. All other systems are reviewed and are negative.   PHYSICAL EXAM: VS:  BP 160/72 mmHg  Pulse 100  Ht 5' (1.524 m)  Wt 188 lb 12.8 oz (85.639 kg)  BMI 36.87 kg/m2  SpO2 99% , Patient is oriented to person time and place. Affect is normal. She is overweight. She is in a wheelchair with a family member here. Head is atraumatic. Sclera and conjunctiva are normal. There is no jugular venous distention. Lungs are clear. Respiratory effort is not labored. Cardiac exam reveals an S1 and S2. Abdomen is soft. The patient has 1-2+ edema in the right lower extremity and 2+ edema in the left lower extremity with heat and redness in the left lower extremity.  EKG: EKG is not done today.      Recent Labs: 04/21/2014: ALT 6; Pro B Natriuretic peptide (BNP) 1164.0* 08/06/2014: Hemoglobin 13.5; Magnesium 1.6; Platelets 267 08/07/2014: TSH 2.310 09/24/2014: BUN 25*; Creatinine 1.0; Potassium 3.8; Sodium 137    Lipid Panel No results found for: CHOL, TRIG, HDL, CHOLHDL, VLDL, LDLCALC, LDLDIRECT    Wt Readings from Last 3 Encounters:  12/29/14 188 lb 12.8 oz (85.639 kg)  09/03/14 169 lb (76.658 kg)  08/06/14 168 lb 8 oz (76.431 kg)      Current medicines are reviewed. We have reviewed her meds carefully. We tried to be very careful and complete in outlining her meds for her. We help her by calling on a regular basis and telling her exactly how to adjust her meds.      ASSESSMENT AND PLAN:

## 2014-12-30 ENCOUNTER — Other Ambulatory Visit: Payer: Self-pay

## 2014-12-30 LAB — BASIC METABOLIC PANEL
BUN: 32 mg/dL — ABNORMAL HIGH (ref 6–23)
CALCIUM: 9.7 mg/dL (ref 8.4–10.5)
CO2: 30 mEq/L (ref 19–32)
Chloride: 99 mEq/L (ref 96–112)
Creatinine, Ser: 1.12 mg/dL (ref 0.40–1.20)
GFR: 60.03 mL/min (ref >60.00)
GLUCOSE: 82 mg/dL (ref 70–99)
POTASSIUM: 3.6 meq/L (ref 3.5–5.1)
SODIUM: 137 meq/L (ref 135–145)

## 2014-12-30 MED ORDER — METOLAZONE 2.5 MG PO TABS: ORAL_TABLET | ORAL | Status: DC

## 2015-01-09 ENCOUNTER — Telehealth: Payer: Self-pay

## 2015-01-09 NOTE — Telephone Encounter (Signed)
12/22/14  Weight= 169 lbs. The pt states that she has increased swelling in both of her ankles and no SOB at this time.  12/24/14  Weight= 172 lbs. The pt states that she has edema in her left ankle and that the edema in her right ankle is better today. Also she states that she has no SOB at this time. She did take a Zaroxolyn dose today due to weight of 172 lbs. 12/29/14  No home weight. OV Weight= 188 lbs. The pt is here for an office visit with Dr Myrtis Ser today. She has more edema in her left lower extremity than at her last OV. The pt is advised to take Zaroxolyn 2.5 mg daily until she is down 5 lbs. and to have a BMET drawn at her PCP's office this week. The pt verbalized understanding. 12/30/14  Weight= 182 lbs. The pt reports left ankle edema that is improving and that she has no SOB at this time. 01/05/15  Weight= 176 lbs.  The pt states that her left ankle edema is still improving and that she has no SOB at this time. The pt is advised per Dr Myrtis Ser to stop taking Zaroxolyn 2.5 mg daily as she has lost 5 lbs. Her new dry weight is 178 lbs. On days that the pts weight is 179 lbs. or greater she has been instructed to take a dose of Zaroxolyn 2.5 mg, she verbalized understanding. 01/06/15  Weight= 177 lbs. The pt reports that her left ankle edema is much improved and that she has no SOB at this time.  01/07/15  Weight= 176 lbs.  The pt states that she has mild left ankle edema and no SOB at this time.  01/08/15  Weight= 178 lbs.   The pt states that she has mild left ankle edema and no SOB at this time.

## 2015-01-13 ENCOUNTER — Encounter: Payer: Self-pay | Admitting: Internal Medicine

## 2015-01-13 ENCOUNTER — Ambulatory Visit (INDEPENDENT_AMBULATORY_CARE_PROVIDER_SITE_OTHER): Payer: Medicare Other | Admitting: Internal Medicine

## 2015-01-13 VITALS — BP 158/76 | HR 93 | Ht 60.0 in

## 2015-01-13 DIAGNOSIS — Z950 Presence of cardiac pacemaker: Secondary | ICD-10-CM | POA: Diagnosis not present

## 2015-01-13 DIAGNOSIS — R0789 Other chest pain: Secondary | ICD-10-CM

## 2015-01-13 DIAGNOSIS — I4430 Unspecified atrioventricular block: Secondary | ICD-10-CM

## 2015-01-13 DIAGNOSIS — I10 Essential (primary) hypertension: Secondary | ICD-10-CM | POA: Diagnosis not present

## 2015-01-13 LAB — MDC_IDC_ENUM_SESS_TYPE_INCLINIC
Battery Impedance: 327 Ohm
Brady Statistic AP VS Percent: 0 %
Brady Statistic AS VP Percent: 93 %
Brady Statistic AS VS Percent: 1 %
Date Time Interrogation Session: 20160223153534
Lead Channel Impedance Value: 462 Ohm
Lead Channel Pacing Threshold Amplitude: 0.75 V
Lead Channel Pacing Threshold Pulse Width: 0.4 ms
Lead Channel Pacing Threshold Pulse Width: 0.4 ms
Lead Channel Sensing Intrinsic Amplitude: 2.8 mV
Lead Channel Sensing Intrinsic Amplitude: 22 mV
Lead Channel Setting Pacing Amplitude: 2 V
Lead Channel Setting Pacing Amplitude: 2.5 V
MDC IDC MSMT BATTERY REMAINING LONGEVITY: 85 mo
MDC IDC MSMT BATTERY VOLTAGE: 2.77 V
MDC IDC MSMT LEADCHNL RA IMPEDANCE VALUE: 354 Ohm
MDC IDC MSMT LEADCHNL RV PACING THRESHOLD AMPLITUDE: 0.75 V
MDC IDC SET LEADCHNL RV PACING PULSEWIDTH: 0.4 ms
MDC IDC SET LEADCHNL RV SENSING SENSITIVITY: 4 mV
MDC IDC STAT BRADY AP VP PERCENT: 6 %

## 2015-01-13 MED ORDER — NITROGLYCERIN 0.4 MG SL SUBL: 0.4000 mg | SUBLINGUAL_TABLET | SUBLINGUAL | Status: DC | PRN

## 2015-01-13 MED ORDER — CARVEDILOL 3.125 MG PO TABS: 3.1250 mg | ORAL_TABLET | Freq: Two times a day (BID) | ORAL | Status: DC

## 2015-01-13 NOTE — Assessment & Plan Note (Signed)
Her blood pressure is elevated. We will start a low dose of coreg. She is encouraged to lose weight and maintain a low sodium diet.

## 2015-01-13 NOTE — Assessment & Plan Note (Signed)
Her medtronic ddd pm is working normally. Will recheck in several months. She has 7 years of battery longevity

## 2015-01-13 NOTE — Assessment & Plan Note (Signed)
She will continue her diuretic therapy. Because her blood pressure is high and because she now has a PPM, I have recommended we try low dose coreg twice daily.

## 2015-01-13 NOTE — Assessment & Plan Note (Signed)
I have given her a prescription for slntg to take prn chest pressure.

## 2015-01-13 NOTE — Patient Instructions (Signed)
Your physician wants you to follow-up in: 6 months with device clinic and 12 months with Dr Court Joy will receive a reminder letter in the mail two months in advance. If you don't receive a letter, please call our office to schedule the follow-up appointment.   Your physician has recommended you make the following change in your medication:  1) Start Carvedilol 3.125mg  twice daily 2) NTG 0.4mg  as directed for chest pain

## 2015-01-13 NOTE — Progress Notes (Signed)
HPI [name] returns today for followup. She is a very pleasant 81 year old woman with a history of hypertension and symptomatic 2:1 heart block, status post pacemaker insertion. She has a history of chronic lower extremity venous stasis and cellulitis. In the interim, she has been stable. She has become progressively more sedentary. She can transfer and ambulate inside the house but very slowly. She has peripheral edema which is chronic. She is on fairly high dose diuretic therapy and potassium supplementation. No syncope. She has rare episodes of chest pain. She has not been on a beta blocker. Allergies  Allergen Reactions  . Aspirin Rash and Other (See Comments)    GI distress  . Tramadol Other (See Comments)    GI distress     Current Outpatient Prescriptions  Medication Sig Dispense Refill  . Cholecalciferol (VITAMIN D3) 3000 UNITS TABS Take 1 capsule by mouth daily.    . cyclobenzaprine (FLEXERIL) 5 MG tablet Take 5 mg by mouth 2 (two) times daily as needed. For muscle spasm.    . furosemide (LASIX) 20 MG tablet Take 3 tablets (60 mg total) by mouth 2 (two) times daily. 180 tablet 5  . magnesium oxide (MAG-OX) 400 (241.3 MG) MG tablet TAKE 1 TABLET (400 MG TOTAL)  BY MOUTH DAILY. 30 tablet 5  . meloxicam (MOBIC) 15 MG tablet Take 15 mg by mouth daily as needed for pain.     . metolazone (ZAROXOLYN) 2.5 MG tablet Take 1 tab daily as needed 30 tablet 3  . Multiple Vitamins-Minerals (MULTIVITAMINS THER. W/MINERALS) TABS Take 1 tablet by mouth daily.      . potassium chloride SA (K-DUR,KLOR-CON) 20 MEQ tablet Take 3 tablets (60 mEq total) by mouth daily. 60 tablet 6   No current facility-administered medications for this visit.     Past Medical History  Diagnosis Date  . Edema     . EF 65-70%, echo, November 29, 2010  . Pulmonary hypertension      46 mmHg... echo... January, 2012  . Cellulitis     Superficial cellulitis right lower leg   January 14, 2011  . AV block      2:1  January 14, 2011 / pacemaker plan when cellulitis resolved in length  . Bradycardia     2:1  AV block  . Hypokalemia     October, 2012  . Ejection fraction     EF 65%, echo, January, 2012  . Murmur     No significant valvular disease by echo, January, 2012  . Pacemaker     January, 2013  . Chronic diastolic CHF (congestive heart failure)   . Hypertension   . Arthritis     "qwhere"    ROS:   All systems reviewed and negative except as noted in the HPI.   Past Surgical History  Procedure Laterality Date  . Hip arthroplasty Right   . Vaginal hysterectomy      "partial"  . Cholecystectomy    . Insert / replace / remove pacemaker  11/2011  . Permanent pacemaker insertion N/A 12/09/2011    Procedure: PERMANENT PACEMAKER INSERTION;  Surgeon: Marinus Maw, MD;  Location: [facility] CATH LAB;  Service: Cardiovascular;  Laterality: N/A;     Family History  Problem Relation Age of Onset  . Cardiomyopathy Father   . Diabetes Father   . Heart failure Father   . Cancer       son  . Coronary artery disease  son  . Dementia Mother      History   Social History  . Marital Status: Widowed    Spouse Name: N/A  . Number of Children: N/A  . Years of Education: N/A   Occupational History  . Retired    Social History Main Topics  . Smoking status: Never Smoker   . Smokeless tobacco: Never Used  . Alcohol Use: No  . Drug Use: No  . Sexual Activity: No   Other Topics Concern  . Not on file   Social History Narrative   Retired    Widowed    Tobacco Use - No.    Alcohol Use - no   Regular Exercise - no   Drug Use - no     BP 158/76 mmHg  Pulse 93  Ht 5' (1.524 m)  Wt   Physical Exam:  Well appearing morbidly obese woman, NAD HEENT: Unremarkable Neck:  8 cm JVD, no thyromegally Back:  No CVA tenderness Lungs:  Clear with no wheezes, rales, or rhonchi. HEART:  Regular rate rhythm, no murmurs, no rubs, no clicks, split S2. Abd:  soft, positive bowel  sounds, no organomegally, no rebound, no guarding Ext:  2 plus pulses, no edema, no cyanosis, no clubbing Skin:  No rashes no nodules Neuro:  CN II through XII intact, motor grossly intact   DEVICE  Normal device function.  See PaceArt for details.   Assess/Plan:

## 2015-01-14 ENCOUNTER — Telehealth: Payer: Self-pay | Admitting: Internal Medicine

## 2015-01-14 NOTE — Telephone Encounter (Signed)
New Msg       Pt daughter calling, requesting a call back about office visit from yesterday.     Please contact daughter Rosezella [place].

## 2015-01-15 NOTE — Telephone Encounter (Addendum)
Spoke with the patient's daughter and let her know about her appointment with Dr Ladona Ridgel.  She was appreciative of the call  Her mom could not remember how high her BP was

## 2015-01-19 ENCOUNTER — Other Ambulatory Visit: Payer: Self-pay

## 2015-01-19 MED ORDER — FUROSEMIDE 20 MG PO TABS: 60.0000 mg | ORAL_TABLET | Freq: Two times a day (BID) | ORAL | Status: DC

## 2015-01-22 ENCOUNTER — Telehealth: Payer: Self-pay

## 2015-01-22 NOTE — Telephone Encounter (Signed)
01/09/15 Weight= 175 lbs. The pt states that she has left ankle swelling and no SOB at this time.  01/13/15  Weight= 180 lbs. The pt reports no change in her edema or SOB status at this time. The pt took a Zaroxolyn dose today due to weight greater than 179 lbs. 01/14/15  Weight= 174 lbs. The pt states that her left leg edema is better today and that she has no SOB at this time.  01/15/15  Weight= 172 lbs.  The pt states that she has increased edema in her left leg and no SOB. 01/16/15  Weight= 173 lbs.  The pt reports no changes in her edema or SOB status at this time.  01/19/15  Weight= 175 lbs.  The pt reports no changes in her edema or SOB status at this time.

## 2015-01-30 ENCOUNTER — Telehealth: Payer: Self-pay

## 2015-02-03 ENCOUNTER — Telehealth: Payer: Self-pay

## 2015-02-03 ENCOUNTER — Encounter: Payer: Self-pay | Admitting: Internal Medicine

## 2015-02-03 NOTE — Telephone Encounter (Signed)
**Note De-Identified [place] [employer] Obfuscation** 01/21/15  Weight= 176 lbs. The pt states that she has mild edema in her ankles and no SOB at this time. 01/22/15  Weight= 176 lbs. The pt reports no changes in her edema or SOB status at this time. 01/27/15  Weight= 171 lbs. The pt states that she has mild edema in her legs and ankles and no SOB at this time. 01/28/15  Weight= 171 lbs. The pt states that she has mild edema in her legs and ankles and no SOB at this time. 01/30/15  Weight= 172 lbs. The pt c/o swelling in her ankles and no SOB at this time. 02/03/15  Weight= 177 lbs.   The pt c/o swelling in her ankles and no SOB at this time.

## 2015-02-18 NOTE — Telephone Encounter (Signed)
**Note De-Identified Jonty Morrical Obfuscation** 02/05/15  Weight= 172 lbs.  The pt states that she has left ankle edema and no SOB at this time. 02/06/15  Weight= 171 lbs.  The pt reports no changes in her edema or SOB status at this time. 02/07/15  Weight= 176 lbs.  The pt states that she has mild right leg edema and continued left ankle edema. 02/09/15  Weight= 171 lbs.  The pt reports no changes in her edema or SOB status at this time. 02/11/15  Weight= 172 lbs.  The pt reports no changes in her edema or SOB status at this time. 02/12/15  Weight= 175 lbs.  The pt reports no changes in her edema or SOB status at this time. 02/13/15  Weight= 171 lbs.  The pt reports no changes in her edema or SOB status at this time. 02/14/15  Weight=  175 lbs  The pt states that she has continued left ankle edema. 02/16/15  Weight= 178 lbs.  The pt reports no changes in her edema or SOB status at this time. 02/17/15  Weight= 177 lbs.   The pt reports no changes in her edema or SOB status at this time. 02/18/15  Weight= 172 lbs.   The pt reports no changes in her edema or SOB status at this time.

## 2015-03-09 ENCOUNTER — Telehealth: Payer: Self-pay

## 2015-03-09 NOTE — Telephone Encounter (Signed)
02/23/15  Weight= 171 lbs.  The pt states that she has increased swelling in her left ankle and no sob at this time. 02/25/15  Weight= 171 lbs.  The pt reports no changes in her left ankle edema or sob at this time. 02/26/15  Weight= 172 lbs.  The pt reports no changes in her left ankle edema or sob at this time. 03/01/15  Weight= 170 lbs.  The pt reports no changes in her left ankle edema or sob at this time. 03/02/15  Weight= 170 lbs.  The pt states that she has continued swelling in her left ankle and no SOB at this time. 03/03/15  Weight= 173 lbs.  The pt reports no changes in her left ankle edema or sob at this time. 03/04/15  Weight= 172 lbs.  The pt reports no changes in her left ankle edema or sob at this time. 03/05/15  Weight= 176 lbs.  The pt reports no changes in her left ankle edema or sob at this time. 03/06/15  Weight= 176 lbs.  The pt reports no changes in her left ankle edema or sob at this time.

## 2015-03-16 ENCOUNTER — Telehealth: Payer: Self-pay

## 2015-03-16 DIAGNOSIS — I5032 Chronic diastolic (congestive) heart failure: Secondary | ICD-10-CM

## 2015-03-16 NOTE — Telephone Encounter (Signed)
**Note De-Identified Catlyn [place] Obfuscation** [name], the pts daughter, states that her brother (the pts son) will bring the pt to the office on 4/27 to have BMET drawn. BMET has been ordered and scheduled to be drawn on 4/27.

## 2015-03-16 NOTE — Telephone Encounter (Signed)
Follow up    Daughter calling wants to let nurse know she will be coming on Wednesday.

## 2015-03-16 NOTE — Telephone Encounter (Signed)
The pt c/o cramping in her legs X 1 week. She states that she will try to come to the office tomorrow or Wednesday for a BMET so we can check her potassium level. Dr Myrtis Ser is aware.

## 2015-03-18 ENCOUNTER — Other Ambulatory Visit (INDEPENDENT_AMBULATORY_CARE_PROVIDER_SITE_OTHER): Payer: Medicare Other | Admitting: *Deleted

## 2015-03-18 DIAGNOSIS — I5032 Chronic diastolic (congestive) heart failure: Secondary | ICD-10-CM | POA: Diagnosis not present

## 2015-03-18 LAB — BASIC METABOLIC PANEL
BUN: 28 mg/dL — ABNORMAL HIGH (ref 6–23)
CALCIUM: 9.6 mg/dL (ref 8.4–10.5)
CHLORIDE: 97 meq/L (ref 96–112)
CO2: 32 mEq/L (ref 19–32)
Creatinine, Ser: 1.51 mg/dL — ABNORMAL HIGH (ref 0.40–1.20)
GFR: 42.5 mL/min — AB (ref >60.00)
Glucose, Bld: 115 mg/dL — ABNORMAL HIGH (ref 70–99)
POTASSIUM: 3.9 meq/L (ref 3.5–5.1)
SODIUM: 135 meq/L (ref 135–145)

## 2015-03-24 ENCOUNTER — Telehealth: Payer: Self-pay

## 2015-03-24 NOTE — Telephone Encounter (Signed)
03/09/15  Weight= 176 lbs.  The pt states that she has left ankle edema and no SOB at this time. 03/11/15  Weight= 176 lbs.  The pt states that she has left ankle edema and no SOB at this time. 03/13/15  Weight= 173 lbs.  The pt c/o left ankle and both feet edema and no SOB at this time. 03/14/15  Weight= 177 lbs.   The pt states that she has left ankle edema (feet edema has improved) and no SOB at this time. 03/16/15  Weight= 172 lbs.   The pt states that she has left ankle edema and no SOB at this time. 03/17/15  Weight= 171 lbs.   The pt states that she has left ankle edema and no SOB at this time. 03/18/15  Weight= 172 lbs.   The pt states that she has left ankle edema and no SOB at this time. 03/19/15  Weight= 173 lbs.   The pt states that she has left ankle edema and no SOB at this time. 03/20/15  Weight= 176 lbs.   The pt states that she has left ankle edema and no SOB at this time. 03/21/15  Weight= 173 lbs.   The pt states that she has left ankle edema and no SOB at this time.

## 2015-03-31 ENCOUNTER — Other Ambulatory Visit: Payer: Self-pay | Admitting: Cardiology

## 2015-04-01 ENCOUNTER — Ambulatory Visit: Payer: Medicare Other | Admitting: Cardiology

## 2015-04-02 NOTE — Telephone Encounter (Signed)
Note 2.23.16

## 2015-04-06 ENCOUNTER — Encounter: Payer: Self-pay | Admitting: Cardiology

## 2015-04-06 ENCOUNTER — Ambulatory Visit (INDEPENDENT_AMBULATORY_CARE_PROVIDER_SITE_OTHER): Payer: Medicare Other | Admitting: Cardiology

## 2015-04-06 VITALS — BP 140/70 | HR 62 | Ht 60.0 in | Wt 171.2 lb

## 2015-04-06 DIAGNOSIS — G4762 Sleep related leg cramps: Secondary | ICD-10-CM | POA: Diagnosis not present

## 2015-04-06 DIAGNOSIS — I5032 Chronic diastolic (congestive) heart failure: Secondary | ICD-10-CM | POA: Diagnosis not present

## 2015-04-06 DIAGNOSIS — Z950 Presence of cardiac pacemaker: Secondary | ICD-10-CM | POA: Diagnosis not present

## 2015-04-06 MED ORDER — ACETAMINOPHEN-CODEINE 300-30 MG PO TABS: 1.0000 | ORAL_TABLET | Freq: Two times a day (BID) | ORAL | Status: DC

## 2015-04-06 MED ORDER — ACETAMINOPHEN-CODEINE 300-30 MG PO TABS: 1.0000 | ORAL_TABLET | Freq: Two times a day (BID) | ORAL | Status: DC | PRN

## 2015-04-06 NOTE — Assessment & Plan Note (Signed)
Even though her home weight is listed at 171, which would normally be good for her, she does have edema. She may have lost to body weight. She will take one dose of metolazone. We will then follow-up to see how she has responded to this. We may need to push for lower dry weight.

## 2015-04-06 NOTE — Assessment & Plan Note (Signed)
Patient has intermittent diffuse pain. Over-the-counter meds did not seem to help. I am allowing her to have some Tylenol with Codeine. This can be used infrequently and with a low-dose.

## 2015-04-06 NOTE — Assessment & Plan Note (Signed)
Pacemaker is followed to the electrophysiology team and is working well.

## 2015-04-06 NOTE — Assessment & Plan Note (Signed)
This is a major problem for her. Unfortunately there are no good recommendations. I've encouraged her to try some tonic water to see if it helps.

## 2015-04-06 NOTE — Patient Instructions (Signed)
**Note De-Identified Jeremias [place] Obfuscation** Medication Instructions:  Remain the same. Take a Zaroxolyn dose tomorrow.  Labwork: None  Testing/Procedures: None  Follow-Up: Your physician recommends that you schedule a follow-up appointment in: September, 2016   Any Other Special Instructions Will Be Listed Below (If Applicable). Try Tonic water for your leg cramps

## 2015-04-06 NOTE — Progress Notes (Signed)
Cardiology Office Note   Date:  04/06/2015   ID:  ESHAL [name], DOB Aug 12, 1931, MRN [id number]  PCP:  Alice Reichert, MD  Cardiologist:  Willa Rough, MD   Chief Complaint  Patient presents with  . Appointment    Follow-up peripheral edema      History of Present Illness: [name] is a 81 y.o. female who presents to follow-up fluid overload. I saw her last December 29, 2014. My nurse calls the patient almost every day to discuss her weight and her diuretic dosing. Her home weights most recently have been in the range of 171. Historically we've had a difficult time being sure about her weights at home and being sure about the medicine she is taking. We have been using metolazone as needed for higher weights. According to our records, she has not used this medication in several weeks. She is not having shortness of breath. However she does have pain in her legs that is sometimes related to her edema. She definitely has cramps that are affecting her greatly at nighttime. She is trying mustard as a remedy. We have checked her labs recently and her potassium is in the normal range.    Past Medical History  Diagnosis Date  . Edema     . EF 65-70%, echo, November 29, 2010  . Pulmonary hypertension      46 mmHg... echo... January, 2012  . Cellulitis     Superficial cellulitis right lower leg   January 14, 2011  . AV block      2:1  January 14, 2011 / pacemaker plan when cellulitis resolved in length  . Bradycardia     2:1  AV block  . Hypokalemia     October, 2012  . Ejection fraction     EF 65%, echo, January, 2012  . Murmur     No significant valvular disease by echo, January, 2012  . Pacemaker     January, 2013  . Chronic diastolic CHF (congestive heart failure)   . Hypertension   . Arthritis     "qwhere"    Past Surgical History  Procedure Laterality Date  . Hip arthroplasty Right   . Vaginal hysterectomy      "partial"  . Cholecystectomy    . Insert /  replace / remove pacemaker  11/2011  . Permanent pacemaker insertion N/A 12/09/2011    Procedure: PERMANENT PACEMAKER INSERTION;  Surgeon: Marinus Maw, MD;  Location: [facility] CATH LAB;  Service: Cardiovascular;  Laterality: N/A;    Patient Active Problem List   Diagnosis Date Noted  . Pressure in chest 01/13/2015  . Generalized weakness 08/06/2014  . Protein-calorie malnutrition, moderate 08/06/2014  . Osteoarthrosis, unspecified whether generalized or localized, unspecified site 04/29/2014  . Anxiety 04/28/2014  . Neck pain 04/28/2014  . Weakness 04/21/2014  . FTT (failure to thrive) in adult 04/21/2014  . Chronic diastolic CHF (congestive heart failure)   . Pacemaker   . Hypertension 12/10/2011  . Cardiac pacemaker 12/10/2011  . Murmur   . Hypokalemia   . Ejection fraction   . Edema   . AV block   . Cellulitis   . Pulmonary hypertension       Current Outpatient Prescriptions  Medication Sig Dispense Refill  . carvedilol (COREG) 3.125 MG tablet Take 3.125 mg by mouth 2 (two) times daily with a meal.    . Cholecalciferol (VITAMIN D3) 3000 UNITS TABS Take 1 capsule by mouth daily.    [name]  cyclobenzaprine (FLEXERIL) 5 MG tablet Take 5 mg by mouth 2 (two) times daily as needed. For muscle spasm.    . furosemide (LASIX) 20 MG tablet Take 3 tablets (60 mg total) by mouth 2 (two) times daily. 180 tablet 5  . magnesium oxide (MAG-OX) 400 (241.3 MG) MG tablet TAKE ONE TABLET BY MOUTH ONE TIME DAILY 30 tablet 4  . meloxicam (MOBIC) 15 MG tablet Take 15 mg by mouth daily as needed for pain.     . metolazone (ZAROXOLYN) 2.5 MG tablet Take 1 tab daily as needed 30 tablet 3  . Multiple Vitamins-Minerals (MULTIVITAMINS THER. W/MINERALS) TABS Take 1 tablet by mouth daily.      . nitroGLYCERIN (NITROSTAT) 0.4 MG SL tablet Place 0.4 mg under the tongue every 5 (five) minutes as needed for chest pain.    . potassium chloride SA (K-DUR,KLOR-CON) 20 MEQ tablet TAKE 3 TABLETS (60 MEQ TOTAL) BY MOUTH  DAILY. 90 tablet 5  . Acetaminophen-Codeine 300-30 MG per tablet Take 1 tablet by mouth 2 (two) times daily as needed for pain. For leg pain 45 tablet 5   No current facility-administered medications for this visit.    Allergies:   Aspirin and Tramadol    Social History:  The patient  reports that she has never smoked. She has never used smokeless tobacco. She reports that she does not drink alcohol or use illicit drugs.   Family History:  The patient's family history includes Cancer in an other family member; Cardiomyopathy in her father; Coronary artery disease in an other family member; Dementia in her mother; Diabetes in her father; Heart failure in her father.    ROS:  Please see the history of present illness.  Patient denies fever, chills, headache, sweats, rash, change in vision, change in hearing, chest pain, cough, nausea or vomiting, urinary symptoms. All other systems are reviewed and are negative.      PHYSICAL EXAM: VS:  BP 140/70 mmHg  Pulse 62  Ht 5' (1.524 m)  Wt 171 lb 3.2 oz (77.656 kg)  BMI 33.44 kg/m2 , Patient is here with a family member. She is in a wheelchair. Head is atraumatic. Sclera and conjunctiva are normal. There is no jugular venous distention. Lungs are clear. Respiratory effort is not labored. The patient is overweight. Cardiac exam reveals S1 and S2. The abdomen is soft. The patient has 2+ edema in the left leg and 1+ in the right leg.  EKG:   EKG is not done today.   Recent Labs: 04/21/2014: ALT 6; Pro B Natriuretic peptide (BNP) 1164.0* 08/06/2014: Hemoglobin 13.5; Magnesium 1.6; Platelets 267 08/07/2014: TSH 2.310 03/18/2015: BUN 28*; Creatinine 1.51*; Potassium 3.9; Sodium 135    Lipid Panel No results found for: CHOL, TRIG, HDL, CHOLHDL, VLDL, LDLCALC, LDLDIRECT    Wt Readings from Last 3 Encounters:  04/06/15 171 lb 3.2 oz (77.656 kg)  12/29/14 188 lb 12.8 oz (85.639 kg)  09/03/14 169 lb (76.658 kg)      Current medicines are reviewed        ASSESSMENT AND PLAN:

## 2015-04-08 ENCOUNTER — Telehealth: Payer: Self-pay | Admitting: Cardiology

## 2015-04-08 NOTE — Telephone Encounter (Signed)
New Message       Pt's daughter calling in regards to her appt last Monday and states that Dr. Myrtis Ser told her to get some type of supplement at the store and she wants to know the name of it and also know how the pt's appt went b/c the pt is in a lot of pain. Please call back and advise.

## 2015-04-08 NOTE — Telephone Encounter (Signed)
Pts daughter calling to obtain what Dr Myrtis Ser advised on for the pt to take for nocturnal leg cramps and for her arthritis.  Informed the Daughter (with the pts permission) that per Dr Myrtis Ser assessment and plan from the OV with the pt on 04/06/15, the pt should get some tonic water and drink this at night to aid with nocturnal leg cramps.  Also informed the daughter that per Dr Myrtis Ser assessment and plan from that OV, he prescribed the pt Tylenol with codeine to be used infrequently and with a low-dose.  Informed the daughter that he prescribed this to help with her osteoarthritis and leg pain.   Daughter verbalized understanding, agrees with this plan, and very gracious for all the assistance provided.

## 2015-04-09 ENCOUNTER — Telehealth: Payer: Self-pay

## 2015-04-09 NOTE — Telephone Encounter (Addendum)
I called to f/u on the pt since her OV on 5/16 with Dr Myrtis Ser. Her weight on 5/16 was 171 lbs.  The pt was advised to take a Zaroxolyn 2.5 mg dose when she returned home from her OV on 5/16 due to edema in her ankles and feet. The pt states that she did take the dose of Zaroxolyn when she got home that day. Her weight on 5/17= 174 lbs                   on 5/18= 173 lbs. She reports that she has not yet weighed herself today because she is having knee pain and will weigh later today. She states that she has no SOB at this time but that she continues to have edema in her ankles and feet that is "a little bit better" than at her OV on 5/16.  Will forward to Dr Myrtis Ser for his recommendations.

## 2015-04-10 MED ORDER — METOLAZONE 2.5 MG PO TABS: ORAL_TABLET | ORAL | Status: DC

## 2015-04-10 NOTE — Telephone Encounter (Signed)
**Note De-Identified [name] Obfuscation** Per Dr Myrtis Ser the pt is advised that her new base weight is 172 lbs. When her daily weight is greater than 172 lbs. she is advised to take Zaroxolyn 2.5 mg. She verbalized understanding.

## 2015-04-22 ENCOUNTER — Telehealth: Payer: Self-pay | Admitting: Cardiology

## 2015-04-22 NOTE — Telephone Encounter (Signed)
New Prob   Pt calling to report her weight: 169.8 (04/22/15).

## 2015-04-22 NOTE — Telephone Encounter (Signed)
**Note De-Identified [name] Obfuscation** Noted. Will give information to Dr Myrtis Ser.

## 2015-04-23 ENCOUNTER — Emergency Department (HOSPITAL_COMMUNITY)
Admission: EM | Admit: 2015-04-23 | Discharge: 2015-04-24 | Disposition: A | Discharge status: Home / Self Care | Payer: Medicare Other | Attending: Emergency Medicine | Admitting: Emergency Medicine

## 2015-04-23 ENCOUNTER — Encounter (HOSPITAL_COMMUNITY): Payer: Self-pay | Admitting: Nurse Practitioner

## 2015-04-23 DIAGNOSIS — R011 Cardiac murmur, unspecified: Secondary | ICD-10-CM | POA: Diagnosis not present

## 2015-04-23 DIAGNOSIS — R252 Cramp and spasm: Secondary | ICD-10-CM | POA: Diagnosis not present

## 2015-04-23 DIAGNOSIS — I5032 Chronic diastolic (congestive) heart failure: Secondary | ICD-10-CM | POA: Diagnosis not present

## 2015-04-23 DIAGNOSIS — R600 Localized edema: Secondary | ICD-10-CM | POA: Diagnosis not present

## 2015-04-23 DIAGNOSIS — E876 Hypokalemia: Secondary | ICD-10-CM | POA: Diagnosis not present

## 2015-04-23 DIAGNOSIS — Z872 Personal history of diseases of the skin and subcutaneous tissue: Secondary | ICD-10-CM | POA: Insufficient documentation

## 2015-04-23 DIAGNOSIS — I279 Pulmonary heart disease, unspecified: Secondary | ICD-10-CM | POA: Diagnosis not present

## 2015-04-23 DIAGNOSIS — I10 Essential (primary) hypertension: Secondary | ICD-10-CM | POA: Diagnosis not present

## 2015-04-23 DIAGNOSIS — E669 Obesity, unspecified: Secondary | ICD-10-CM | POA: Insufficient documentation

## 2015-04-23 DIAGNOSIS — Z79899 Other long term (current) drug therapy: Secondary | ICD-10-CM | POA: Insufficient documentation

## 2015-04-23 DIAGNOSIS — Z950 Presence of cardiac pacemaker: Secondary | ICD-10-CM | POA: Insufficient documentation

## 2015-04-23 DIAGNOSIS — M79605 Pain in left leg: Secondary | ICD-10-CM | POA: Diagnosis not present

## 2015-04-23 DIAGNOSIS — M1990 Unspecified osteoarthritis, unspecified site: Secondary | ICD-10-CM | POA: Diagnosis not present

## 2015-04-23 DIAGNOSIS — M7989 Other specified soft tissue disorders: Secondary | ICD-10-CM | POA: Diagnosis present

## 2015-04-23 LAB — CBC
HCT: 31.9 % — ABNORMAL LOW (ref 36.0–46.0)
HEMOGLOBIN: 10.4 g/dL — AB (ref 12.0–15.0)
MCH: 29.2 pg (ref 26.0–34.0)
MCHC: 32.6 g/dL (ref 30.0–36.0)
MCV: 89.6 fL (ref 78.0–100.0)
Platelets: 305 10*3/uL (ref 150–400)
RBC: 3.56 MIL/uL — ABNORMAL LOW (ref 3.87–5.11)
RDW: 14.8 % (ref 11.5–15.5)
WBC: 7.1 10*3/uL (ref 4.0–10.5)

## 2015-04-23 LAB — BASIC METABOLIC PANEL
ANION GAP: 8 (ref 5–15)
BUN: 28 mg/dL — ABNORMAL HIGH (ref 6–20)
CALCIUM: 9.2 mg/dL (ref 8.9–10.3)
CO2: 30 mmol/L (ref 22–32)
CREATININE: 0.91 mg/dL (ref 0.44–1.00)
Chloride: 102 mmol/L (ref 101–111)
GFR calc Af Amer: >60 mL/min (ref >60)
GFR calc non Af Amer: 58 mL/min — ABNORMAL LOW (ref >60)
GLUCOSE: 93 mg/dL (ref 65–99)
POTASSIUM: 3.7 mmol/L (ref 3.5–5.1)
Sodium: 140 mmol/L (ref 135–145)

## 2015-04-23 LAB — I-STAT TROPONIN, ED: Troponin i, poc: 0.03 ng/mL (ref 0.00–0.08)

## 2015-04-23 MED ORDER — ACETAMINOPHEN 500 MG PO TABS
500.0000 mg | ORAL_TABLET | Freq: Once | ORAL | Status: AC
  Administered 2015-04-23: 500 mg via ORAL
  Filled 2015-04-23: qty 1

## 2015-04-23 MED ORDER — HYDROCODONE-ACETAMINOPHEN 5-325 MG PO TABS
1.0000 | ORAL_TABLET | Freq: Once | ORAL | Status: AC
  Administered 2015-04-24: 1 via ORAL
  Filled 2015-04-23: qty 1

## 2015-04-23 NOTE — ED Notes (Signed)
Nurse currently drawing labs 

## 2015-04-23 NOTE — Discharge Instructions (Signed)
Peripheral Edema You have swelling in your legs (peripheral edema). This swelling is due to excess accumulation of salt and water in your body. Edema may be a sign of heart, kidney or liver disease, or a side effect of a medication. It may also be due to problems in the leg veins. Elevating your legs and using special support stockings may be very helpful, if the cause of the swelling is due to poor venous circulation. Avoid long periods of standing, whatever the cause. Treatment of edema depends on identifying the cause. Chips, pretzels, pickles and other salty foods should be avoided. Restricting salt in your diet is almost always needed. Water pills (diuretics) are often used to remove the excess salt and water from your body via urine. These medicines prevent the kidney from reabsorbing sodium. This increases urine flow. Diuretic treatment may also result in lowering of potassium levels in your body. Potassium supplements may be needed if you have to use diuretics daily. Daily weights can help you keep track of your progress in clearing your edema. You should call your caregiver for follow up care as recommended. SEEK IMMEDIATE MEDICAL CARE IF:   You have increased swelling, pain, redness, or heat in your legs.  You develop shortness of breath, especially when lying down.  You develop chest or abdominal pain, weakness, or fainting.  You have a fever. Document Released: 12/15/2004 Document Revised: 01/30/2012 Document Reviewed: 11/25/2009 ExitCare Patient Information 2015 [employer]. This information is not intended to replace advice given to you by your health care provider. Make sure you discuss any questions you have with your health care provider.   Muscle Cramps and Spasms Muscle cramps and spasms occur when a muscle or muscles tighten and you have no control over this tightening (involuntary muscle contraction). They are a common problem and can develop in any muscle. The most common  place is in the calf muscles of the leg. Both muscle cramps and muscle spasms are involuntary muscle contractions, but they also have differences:   Muscle cramps are sporadic and painful. They may last a few seconds to a quarter of an hour. Muscle cramps are often more forceful and last longer than muscle spasms.  Muscle spasms may or may not be painful. They may also last just a few seconds or much longer. CAUSES  It is uncommon for cramps or spasms to be due to a serious underlying problem. In many cases, the cause of cramps or spasms is unknown. Some common causes are:   Overexertion.   Overuse from repetitive motions (doing the same thing over and over).   Remaining in a certain position for a long period of time.   Improper preparation, form, or technique while performing a sport or activity.   Dehydration.   Injury.   Side effects of some medicines.   Abnormally low levels of the salts and ions in your blood (electrolytes), especially potassium and calcium. This could happen if you are taking water pills (diuretics) or you are pregnant.  Some underlying medical problems can make it more likely to develop cramps or spasms. These include, but are not limited to:   Diabetes.   Parkinson disease.   Hormone disorders, such as thyroid problems.   Alcohol abuse.   Diseases specific to muscles, joints, and bones.   Blood vessel disease where not enough blood is getting to the muscles.  HOME CARE INSTRUCTIONS   Stay well hydrated. Drink enough water and fluids to keep your urine clear   or pale yellow.  It may be helpful to massage, stretch, and relax the affected muscle.  For tight or tense muscles, use a warm towel, heating pad, or hot shower water directed to the affected area.  If you are sore or have pain after a cramp or spasm, applying ice to the affected area may relieve discomfort.  Put ice in a plastic bag.  Place a towel between your skin and the  bag.  Leave the ice on for 15-20 minutes, 03-04 times a day.  Medicines used to treat a known cause of cramps or spasms may help reduce their frequency or severity. Only take over-the-counter or prescription medicines as directed by your caregiver. SEEK MEDICAL CARE IF:  Your cramps or spasms get more severe, more frequent, or do not improve over time.  MAKE SURE YOU:   Understand these instructions.  Will watch your condition.  Will get help right away if you are not doing well or get worse. Document Released: 04/29/2002 Document Revised: 03/04/2013 Document Reviewed: 10/24/2012 ExitCare Patient Information 2015 [employer]. This information is not intended to replace advice given to you by your health care provider. Make sure you discuss any questions you have with your health care provider.  

## 2015-04-23 NOTE — ED Provider Notes (Signed)
[name] is a 81 y.o. female    Face-to-face evaluation   History: [name] is a 81 y.o. female who presents for evaluation of lower leg cramps. Grass move from her left to her right leg. Her son is with her and states that her legs have less swelling now in usual. He states that she is minimally active, and sleeps in her lift chair. This causes her feet to be dependent. There is been no recent fever, chills, nausea, vomiting, weakness or dizziness. She has chronic left greater than right leg swelling   Physical exam: Alert, elderly female in minimal discomfort. Heart regular rate and rhythm, no murmur. Lungs clear anteriorly. Leg swelling bilaterally with mild distal erythema. No areas of fluctuance or drainage. Legs are diffusely tender to palpation.  Medical screening examination/treatment/procedure(s) were conducted as a shared visit with non-physician practitioner(s) and myself.  I personally evaluated the patient during the encounter  Mancel Bale, MD 04/25/15 [phone]

## 2015-04-23 NOTE — ED Notes (Signed)
Pt comes from home with c/o severe leg pain that she describes as cramping. Initial assessment of both lower extremities shows 2+ pitting edema which she reports being her norm, states she takes a "fluid pill" and also takes medication for potassium. Pt is denying chest pain, shortness of breath, nausea or vomiting.

## 2015-04-23 NOTE — ED Notes (Signed)
Bed: [facility] Expected date:  Expected time:  Means of arrival:  Comments: EMS- 81yo F, leg cramping

## 2015-04-23 NOTE — ED Provider Notes (Signed)
CSN: [id number]     Arrival date & time 04/23/15  1921 History   First MD Initiated Contact with Patient 04/23/15 2033     Chief Complaint  Patient presents with  . Bilateral Leg Pain & Swelling    [name] is a 81 y.o. female with a history of chronic peripheral edema, chronic left extremity venous stasis and cellulitis, diastolic heart failure, and hypertension, who presents to the ED complaining of bilateral leg cramping and pain that is been ongoing for the past 6 months but worse in the past week. She was at her left leg is chronically slightly more swollen than her right. She works chronic redness to her lower extremities. Patient put she is been taking her Lasix and potassium. She reports Tylenol helps with her pain that she last took Tylenol yesterday. She currently lives at 8 out of 10 pain in her bilateral lower extremities. She is taking nothing for treatment today. The patient's son reports that her legs are actually less swollen than they usually are today. The patient denies history of DVT or PEs. The patient denies fevers, chills, chest pain, shortness of breath, cough, wheezing, changes to her weight, or palpitations.   (Consider location/radiation/quality/duration/timing/severity/associated sxs/prior Treatment) HPI  Past Medical History  Diagnosis Date  . Edema     . EF 65-70%, echo, November 29, 2010  . Pulmonary hypertension      46 mmHg... echo... January, 2012  . Cellulitis     Superficial cellulitis right lower leg   January 14, 2011  . AV block      2:1  January 14, 2011 / pacemaker plan when cellulitis resolved in length  . Bradycardia     2:1  AV block  . Hypokalemia     October, 2012  . Ejection fraction     EF 65%, echo, January, 2012  . Murmur     No significant valvular disease by echo, January, 2012  . Pacemaker     January, 2013  . Chronic diastolic CHF (congestive heart failure)   . Hypertension   . Arthritis     "qwhere"   Past Surgical  History  Procedure Laterality Date  . Hip arthroplasty Right   . Vaginal hysterectomy      "partial"  . Cholecystectomy    . Insert / replace / remove pacemaker  11/2011  . Permanent pacemaker insertion N/A 12/09/2011    Procedure: PERMANENT PACEMAKER INSERTION;  Surgeon: Marinus Maw, MD;  Location: [facility] CATH LAB;  Service: Cardiovascular;  Laterality: N/A;   Family History  Problem Relation Age of Onset  . Cardiomyopathy Father   . Diabetes Father   . Heart failure Father   . Cancer       son  . Coronary artery disease      son  . Dementia Mother    History  Substance Use Topics  . Smoking status: Never Smoker   . Smokeless tobacco: Never Used  . Alcohol Use: No   OB History    No data available     Review of Systems  Constitutional: Negative for fever and chills.  HENT: Negative for congestion and sore throat.   Eyes: Negative for visual disturbance.  Respiratory: Negative for cough, shortness of breath and wheezing.   Cardiovascular: Positive for leg swelling. Negative for chest pain and palpitations.  Gastrointestinal: Negative for nausea, vomiting, abdominal pain and diarrhea.  Genitourinary: Negative for dysuria.  Musculoskeletal: Negative for back pain and neck pain.  Bilateral leg pain  Skin: Positive for rash.  Neurological: Negative for headaches.      Allergies  Aspirin and Tramadol  Home Medications   Prior to Admission medications   Medication Sig Start Date End Date Taking? Authorizing Provider  Acetaminophen-Codeine 300-30 MG per tablet Take 1 tablet by mouth 2 (two) times daily as needed for pain. For leg pain 04/06/15  Yes Luis Abed, MD  carvedilol (COREG) 3.125 MG tablet Take 3.125 mg by mouth 2 (two) times daily with a meal.   Yes Historical Provider, MD  Cholecalciferol (VITAMIN D3) 3000 UNITS TABS Take 1 capsule by mouth daily.   Yes Historical Provider, MD  cyclobenzaprine (FLEXERIL) 5 MG tablet Take 5 mg by mouth 2 (two) times  daily as needed for muscle spasms (muscle spasms). For muscle spasm.   Yes Historical Provider, MD  furosemide (LASIX) 20 MG tablet Take 3 tablets (60 mg total) by mouth 2 (two) times daily. 01/19/15  Yes Luis Abed, MD  magnesium oxide (MAG-OX) 400 (241.3 MG) MG tablet TAKE ONE TABLET BY MOUTH ONE TIME DAILY 04/02/15  Yes Luis Abed, MD  meloxicam (MOBIC) 15 MG tablet Take 15 mg by mouth daily as needed for pain (pain).    Yes Historical Provider, MD  metolazone (ZAROXOLYN) 2.5 MG tablet Take 1 tab daily as needed for daily weight of 172 lbs or greater 04/10/15  Yes Luis Abed, MD  Multiple Vitamins-Minerals (MULTIVITAMINS THER. W/MINERALS) TABS Take 1 tablet by mouth daily.     Yes Historical Provider, MD  potassium chloride SA (K-DUR,KLOR-CON) 20 MEQ tablet TAKE 3 TABLETS (60 MEQ TOTAL) BY MOUTH DAILY. 04/02/15  Yes Luis Abed, MD  nitroGLYCERIN (NITROSTAT) 0.4 MG SL tablet Place 0.4 mg under the tongue every 5 (five) minutes as needed for chest pain.    Historical Provider, MD   BP 128/72 mmHg  Pulse 72  Temp(Src) 98.2 F (36.8 C) (Oral)  Resp 16  SpO2 94% Physical Exam  Constitutional: She is oriented to person, place, and time. She appears well-developed and well-nourished. No distress.  Obese female. Nontoxic appearing.  HENT:  Head: Normocephalic and atraumatic.  Mouth/Throat: Oropharynx is clear and moist.  Eyes: Conjunctivae are normal. Pupils are equal, round, and reactive to light. Right eye exhibits no discharge. Left eye exhibits no discharge.  Neck: Neck supple.  Cardiovascular: Normal rate, regular rhythm, normal heart sounds and intact distal pulses.  Exam reveals no gallop and no friction rub.   No murmur heard. Bilateral radial pulses are intact. Good capillary refill in her bilateral lower extremities. Bilateral lower extremities are warm.   Pulmonary/Chest: Effort normal and breath sounds normal. No respiratory distress. She has no wheezes. She has no rales.   Lungs are clear to auscultation bilaterally.  Abdominal: Soft. Bowel sounds are normal. She exhibits no distension. There is no tenderness.  Musculoskeletal: She exhibits edema and tenderness.  Bilateral pitting lower extremity edema with erythema up to her mid shin. No weeping. Mild bilateral calf tenderness to palpation.   Lymphadenopathy:    She has no cervical adenopathy.  Neurological: She is alert and oriented to person, place, and time. Coordination normal.  Skin: Skin is warm and dry. She is not diaphoretic. There is erythema. No pallor.  Psychiatric: She has a normal mood and affect. Her behavior is normal.  Nursing note and vitals reviewed.   ED Course  Procedures (including critical care time) Labs Review Labs Reviewed  CBC - Abnormal;  Notable for the following:    RBC 3.56 (*)    Hemoglobin 10.4 (*)    HCT 31.9 (*)    All other components within normal limits  BASIC METABOLIC PANEL - Abnormal; Notable for the following:    BUN 28 (*)    GFR calc non Af Amer 58 (*)    All other components within normal limits  I-STAT TROPOININ, ED    Imaging Review No results found.   EKG Interpretation None      Filed Vitals:   04/23/15 1931 04/24/15 0018  BP: 138/61 128/72  Pulse: 82 72  Temp: 97.7 F (36.5 C) 98.2 F (36.8 C)  TempSrc: Oral   Resp: 18 16  SpO2: 92% 94%     MDM   Meds given in ED:  Medications  acetaminophen (TYLENOL) tablet 500 mg (500 mg Oral Given 04/23/15 2212)  HYDROcodone-acetaminophen (NORCO/VICODIN) 5-325 MG per tablet 1 tablet (1 tablet Oral Given 04/24/15 0012)    Discharge Medication List as of 04/23/2015 11:15 PM      Final diagnoses:  Cramp of both lower extremities  Bilateral edema of lower extremity   This  is a 81 y.o. female with a history of chronic peripheral edema, chronic left extremity venous stasis and cellulitis, diastolic heart failure, and hypertension, who presents to the ED complaining of bilateral leg cramping and  pain that is been ongoing for the past 6 months but worse in the past week. The patient denies any chest pain, shortness of breath or palpitations. She reports taking her medications as prescribed. She reports being concerned that her potassium is low. On exam patient is afebrile nontoxic appearing. She does have moderate bilateral lower extremity edema with mild amount of erythema that appears chronic. I note that on her cardiology office visits they also note bilateral lower extremity edema and erythema. The patient is not tachypneic, tachycardic or hypoxic. The patient's CBC is around her baseline. BMP shows a potassium of 3.7 and is unremarkable. A troponin was ordered by triage nurse and was negative, but I am not concerned about cardiac issues currently. The patient's son reports that her lower extremity edema is actually improved tonight and can be much worse than this. As this patient's problems seem to be ongoing and not worsened or changed and her blood work is unremarkable, I feel this patient is safe to be discharged and follow-up with her primary care provider. I encouraged her to use Tylenol more frequently for pain. She seems to only be taking it very intermittently. I advised the patient to follow-up with their primary care provider this week. I advised the patient to return to the emergency department with new or worsening symptoms or new concerns. The patient and her son verbalized understanding and agreement with plan.    This patient was discussed with and evaluated by Dr. Effie Shy who agrees with assessment and plan.     Everlene Farrier, PA-C 04/24/15 0129  Mancel Bale, MD 04/25/15 [phone]

## 2015-04-24 DIAGNOSIS — L0390 Cellulitis, unspecified: Secondary | ICD-10-CM | POA: Diagnosis not present

## 2015-04-28 ENCOUNTER — Telehealth: Payer: Self-pay | Admitting: Cardiology

## 2015-04-28 DIAGNOSIS — R69 Illness, unspecified: Secondary | ICD-10-CM | POA: Diagnosis not present

## 2015-04-28 NOTE — Telephone Encounter (Signed)
Pt's daughter Rosezella [place] called regarding pt. [name] said that pt is not mobile anymore. Daughter said the pt states is not able to have physical therapy, because it will affect her heart because of her pacer. Daughter states that she thinks that Dr. Myrtis Ser and pt's PCP supposed to get together and come up with something that will benefit the pt. Pt's daughter would like for [facility] Dr. Henrietta Hoover nurse to call her when she is here in the office  this coming Thursday.

## 2015-04-28 NOTE — Telephone Encounter (Signed)
New message      Daughter is concerned that her mother is not mobile any more.  EMT was at her home this am because she slid out of her chair.  Daughter thinks she fell.  Daughter says her legs are really weak.  Can pt go into a rehab place for rehab? Daughter said Dr Myrtis Ser has no idea she is this weak.  Last visit pt was in a wheelchair. Patient lives alone.  Daughter want to talk to Dr Myrtis Ser.

## 2015-04-29 ENCOUNTER — Encounter (HOSPITAL_COMMUNITY): Payer: Self-pay | Admitting: Emergency Medicine

## 2015-04-29 ENCOUNTER — Observation Stay (HOSPITAL_COMMUNITY)
Admission: EM | Admit: 2015-04-29 | Discharge: 2015-05-01 | Disposition: A | Discharge status: Home / Self Care | Payer: Medicare Other | Attending: Family Medicine | Admitting: Family Medicine

## 2015-04-29 DIAGNOSIS — R600 Localized edema: Principal | ICD-10-CM | POA: Diagnosis present

## 2015-04-29 DIAGNOSIS — R011 Cardiac murmur, unspecified: Secondary | ICD-10-CM | POA: Insufficient documentation

## 2015-04-29 DIAGNOSIS — I4430 Unspecified atrioventricular block: Secondary | ICD-10-CM | POA: Insufficient documentation

## 2015-04-29 DIAGNOSIS — Z872 Personal history of diseases of the skin and subcutaneous tissue: Secondary | ICD-10-CM | POA: Diagnosis not present

## 2015-04-29 DIAGNOSIS — I10 Essential (primary) hypertension: Secondary | ICD-10-CM | POA: Insufficient documentation

## 2015-04-29 DIAGNOSIS — M79604 Pain in right leg: Secondary | ICD-10-CM | POA: Insufficient documentation

## 2015-04-29 DIAGNOSIS — I5032 Chronic diastolic (congestive) heart failure: Secondary | ICD-10-CM | POA: Diagnosis present

## 2015-04-29 DIAGNOSIS — E876 Hypokalemia: Secondary | ICD-10-CM | POA: Diagnosis not present

## 2015-04-29 DIAGNOSIS — M549 Dorsalgia, unspecified: Secondary | ICD-10-CM | POA: Insufficient documentation

## 2015-04-29 DIAGNOSIS — R609 Edema, unspecified: Secondary | ICD-10-CM

## 2015-04-29 DIAGNOSIS — I517 Cardiomegaly: Secondary | ICD-10-CM | POA: Diagnosis not present

## 2015-04-29 DIAGNOSIS — R52 Pain, unspecified: Secondary | ICD-10-CM

## 2015-04-29 DIAGNOSIS — M79605 Pain in left leg: Secondary | ICD-10-CM | POA: Diagnosis present

## 2015-04-29 DIAGNOSIS — M1990 Unspecified osteoarthritis, unspecified site: Secondary | ICD-10-CM | POA: Insufficient documentation

## 2015-04-29 DIAGNOSIS — Z79899 Other long term (current) drug therapy: Secondary | ICD-10-CM | POA: Diagnosis not present

## 2015-04-29 DIAGNOSIS — I509 Heart failure, unspecified: Secondary | ICD-10-CM

## 2015-04-29 DIAGNOSIS — M1612 Unilateral primary osteoarthritis, left hip: Secondary | ICD-10-CM | POA: Diagnosis not present

## 2015-04-29 DIAGNOSIS — M79606 Pain in leg, unspecified: Secondary | ICD-10-CM | POA: Diagnosis not present

## 2015-04-29 DIAGNOSIS — Z950 Presence of cardiac pacemaker: Secondary | ICD-10-CM | POA: Diagnosis not present

## 2015-04-29 DIAGNOSIS — R7989 Other specified abnormal findings of blood chemistry: Secondary | ICD-10-CM | POA: Diagnosis present

## 2015-04-29 DIAGNOSIS — M545 Low back pain: Secondary | ICD-10-CM | POA: Diagnosis not present

## 2015-04-29 DIAGNOSIS — M7989 Other specified soft tissue disorders: Secondary | ICD-10-CM

## 2015-04-29 DIAGNOSIS — J811 Chronic pulmonary edema: Secondary | ICD-10-CM | POA: Diagnosis not present

## 2015-04-29 DIAGNOSIS — R778 Other specified abnormalities of plasma proteins: Secondary | ICD-10-CM | POA: Diagnosis present

## 2015-04-29 DIAGNOSIS — R627 Adult failure to thrive: Secondary | ICD-10-CM | POA: Diagnosis present

## 2015-04-29 NOTE — ED Notes (Signed)
Pt c/o bilateral leg pain. Pt has been dealing with cellulitis for 2 months. Pt states she is here for pain control.

## 2015-04-30 ENCOUNTER — Emergency Department (HOSPITAL_COMMUNITY): Payer: Medicare Other

## 2015-04-30 ENCOUNTER — Encounter (HOSPITAL_COMMUNITY): Payer: Self-pay

## 2015-04-30 DIAGNOSIS — R7989 Other specified abnormal findings of blood chemistry: Secondary | ICD-10-CM | POA: Diagnosis not present

## 2015-04-30 DIAGNOSIS — M1612 Unilateral primary osteoarthritis, left hip: Secondary | ICD-10-CM | POA: Diagnosis not present

## 2015-04-30 DIAGNOSIS — R600 Localized edema: Secondary | ICD-10-CM | POA: Diagnosis not present

## 2015-04-30 DIAGNOSIS — I5032 Chronic diastolic (congestive) heart failure: Secondary | ICD-10-CM | POA: Diagnosis not present

## 2015-04-30 DIAGNOSIS — I517 Cardiomegaly: Secondary | ICD-10-CM | POA: Diagnosis not present

## 2015-04-30 DIAGNOSIS — R609 Edema, unspecified: Secondary | ICD-10-CM | POA: Diagnosis present

## 2015-04-30 DIAGNOSIS — I5033 Acute on chronic diastolic (congestive) heart failure: Secondary | ICD-10-CM | POA: Diagnosis not present

## 2015-04-30 DIAGNOSIS — M545 Low back pain: Secondary | ICD-10-CM | POA: Diagnosis not present

## 2015-04-30 DIAGNOSIS — J811 Chronic pulmonary edema: Secondary | ICD-10-CM | POA: Diagnosis not present

## 2015-04-30 DIAGNOSIS — Z950 Presence of cardiac pacemaker: Secondary | ICD-10-CM

## 2015-04-30 DIAGNOSIS — G4762 Sleep related leg cramps: Secondary | ICD-10-CM | POA: Diagnosis not present

## 2015-04-30 DIAGNOSIS — R778 Other specified abnormalities of plasma proteins: Secondary | ICD-10-CM | POA: Diagnosis present

## 2015-04-30 DIAGNOSIS — R627 Adult failure to thrive: Secondary | ICD-10-CM

## 2015-04-30 LAB — COMPREHENSIVE METABOLIC PANEL
ALBUMIN: 3.6 g/dL (ref 3.5–5.0)
ALT: 10 U/L — ABNORMAL LOW (ref 14–54)
ANION GAP: 10 (ref 5–15)
AST: 24 U/L (ref 15–41)
Alkaline Phosphatase: 90 U/L (ref 38–126)
BUN: 26 mg/dL — ABNORMAL HIGH (ref 6–20)
CO2: 27 mmol/L (ref 22–32)
Calcium: 9.2 mg/dL (ref 8.9–10.3)
Chloride: 102 mmol/L (ref 101–111)
Creatinine, Ser: 0.97 mg/dL (ref 0.44–1.00)
GFR calc non Af Amer: 53 mL/min — ABNORMAL LOW (ref >60)
Glucose, Bld: 102 mg/dL — ABNORMAL HIGH (ref 65–99)
Potassium: 4 mmol/L (ref 3.5–5.1)
SODIUM: 139 mmol/L (ref 135–145)
Total Bilirubin: 0.5 mg/dL (ref 0.3–1.2)
Total Protein: 7.4 g/dL (ref 6.5–8.1)

## 2015-04-30 LAB — BRAIN NATRIURETIC PEPTIDE: B Natriuretic Peptide: 81 pg/mL (ref 0.0–100.0)

## 2015-04-30 LAB — CBC WITH DIFFERENTIAL/PLATELET
BASOS ABS: 0 10*3/uL (ref 0.0–0.1)
Basophils Relative: 1 % (ref 0–1)
EOS ABS: 0.3 10*3/uL (ref 0.0–0.7)
EOS PCT: 5 % (ref 0–5)
HEMATOCRIT: 34.1 % — AB (ref 36.0–46.0)
Hemoglobin: 11.2 g/dL — ABNORMAL LOW (ref 12.0–15.0)
LYMPHS ABS: 1.7 10*3/uL (ref 0.7–4.0)
LYMPHS PCT: 26 % (ref 12–46)
MCH: 29.4 pg (ref 26.0–34.0)
MCHC: 32.8 g/dL (ref 30.0–36.0)
MCV: 89.5 fL (ref 78.0–100.0)
Monocytes Absolute: 0.6 10*3/uL (ref 0.1–1.0)
Monocytes Relative: 10 % (ref 3–12)
NEUTROS PCT: 60 % (ref 43–77)
Neutro Abs: 3.9 10*3/uL (ref 1.7–7.7)
PLATELETS: 264 10*3/uL (ref 150–400)
RBC: 3.81 MIL/uL — ABNORMAL LOW (ref 3.87–5.11)
RDW: 14.8 % (ref 11.5–15.5)
WBC: 6.6 10*3/uL (ref 4.0–10.5)

## 2015-04-30 LAB — TROPONIN I
Troponin I: 0.04 ng/mL — ABNORMAL HIGH (ref <0.031)
Troponin I: 0.04 ng/mL — ABNORMAL HIGH (ref <0.031)
Troponin I: 0.04 ng/mL — ABNORMAL HIGH (ref <0.031)

## 2015-04-30 LAB — MAGNESIUM: MAGNESIUM: 2 mg/dL (ref 1.7–2.4)

## 2015-04-30 LAB — CK: CK TOTAL: 92 U/L (ref 38–234)

## 2015-04-30 MED ORDER — HYDROCODONE-ACETAMINOPHEN 5-325 MG PO TABS
1.0000 | ORAL_TABLET | ORAL | Status: DC | PRN
  Administered 2015-04-30 – 2015-05-01 (×6): 1 via ORAL
  Filled 2015-04-30 (×6): qty 1

## 2015-04-30 MED ORDER — FUROSEMIDE 10 MG/ML IJ SOLN
40.0000 mg | Freq: Once | INTRAMUSCULAR | Status: AC
  Administered 2015-04-30: 40 mg via INTRAVENOUS
  Filled 2015-04-30: qty 4

## 2015-04-30 MED ORDER — MAGNESIUM OXIDE 400 (241.3 MG) MG PO TABS
400.0000 mg | ORAL_TABLET | Freq: Every day | ORAL | Status: DC
  Administered 2015-04-30 – 2015-05-01 (×2): 400 mg via ORAL
  Filled 2015-04-30 (×2): qty 1

## 2015-04-30 MED ORDER — SODIUM CHLORIDE 0.9 % IV SOLN: 250.0000 mL | INTRAVENOUS | Status: DC | PRN

## 2015-04-30 MED ORDER — HYDROCODONE-ACETAMINOPHEN 5-325 MG PO TABS
1.0000 | ORAL_TABLET | Freq: Once | ORAL | Status: AC
  Administered 2015-04-30: 1 via ORAL

## 2015-04-30 MED ORDER — HYDROCODONE-ACETAMINOPHEN 5-325 MG PO TABS
ORAL_TABLET | ORAL | Status: AC
  Filled 2015-04-30: qty 1

## 2015-04-30 MED ORDER — FUROSEMIDE 10 MG/ML IJ SOLN
40.0000 mg | Freq: Two times a day (BID) | INTRAMUSCULAR | Status: AC
  Administered 2015-04-30: 40 mg via INTRAVENOUS
  Filled 2015-04-30: qty 4

## 2015-04-30 MED ORDER — FUROSEMIDE 10 MG/ML IJ SOLN: 80.0000 mg | Freq: Two times a day (BID) | INTRAMUSCULAR | Status: DC

## 2015-04-30 MED ORDER — CARVEDILOL 3.125 MG PO TABS
3.1250 mg | ORAL_TABLET | Freq: Two times a day (BID) | ORAL | Status: DC
  Administered 2015-04-30 – 2015-05-01 (×4): 3.125 mg via ORAL
  Filled 2015-04-30 (×4): qty 1

## 2015-04-30 MED ORDER — SODIUM CHLORIDE 0.9 % IJ SOLN: 3.0000 mL | INTRAMUSCULAR | Status: DC | PRN

## 2015-04-30 MED ORDER — SODIUM CHLORIDE 0.9 % IJ SOLN
3.0000 mL | Freq: Two times a day (BID) | INTRAMUSCULAR | Status: DC
  Administered 2015-04-30 – 2015-05-01 (×3): 3 mL via INTRAVENOUS

## 2015-04-30 NOTE — Evaluation (Signed)
Physical Therapy Evaluation Patient Details Name: [name] MRN: [id number] DOB: 10-05-1931 Today's Date: 04/30/2015   History of Present Illness  81 yo female h/o chronic distolic chf, htn, chronic le edema comes in with problems with leg cramps for the last several days. She went to the [place] ED couple of days ago they gave her a vicodin and she started taking some mustard . But tonight the leg cramps got really bad again, and its affecting her walking. At her baseline she walks with a walker. Lives alone but her kids come every day and she has a nurses aid several hours a day. She denies any sob. Her legs she reports look really good for her. No fevers. No n/v/d. No chest pain. No sob, pnd or orthopnea. A trop was checked in the ED which is mildly elevated, and her cxr reveals some edema. Family is wishing for her to go to snf, but she refuses to do so. We are asked to admit pt for her positive troponin and PT evaluation.  Clinical Impression   Pt was seen for evaluation.  She was alert and oriented, cooperative.  She was found to have significant generalized weakness and now needs max assist for transfers in and out of bed.   She did c/o LE cramps during my visit.  We discussed her functional limitations and inability to manage at home at the current time.  Pt adamantly refuses to go to a SNF.  She definitely needs to have some supervision at home 24 hours/day and then HHPT.    Follow Up Recommendations SNF    Equipment Recommendations  None recommended by PT    Recommendations for Other Services   OT    Precautions / Restrictions Precautions Precautions: Fall Restrictions Weight Bearing Restrictions: No      Mobility  Bed Mobility Overal bed mobility: Needs Assistance Bed Mobility: Supine to Sit     Supine to sit: Max assist        Transfers Overall transfer level: Needs assistance Equipment used: Standard walker Transfers: Stand Pivot Transfers   Stand pivot transfers: +2 physical assistance       General transfer comment: pt has significant difficulty attaining full stance either with a walker or with therapist assist  Ambulation/Gait             General Gait Details: unable to ambulate                       Balance Overall balance assessment:  (good sitting balance, unable to assess standing balance)                                           Pertinent Vitals/Pain Pain Assessment: No/denies pain    Home Living Family/patient expects to be discharged to:: Skilled nursing facility                      Prior Function Level of Independence: Needs assistance   Gait / Transfers Assistance Needed: independent in gait with a walker, sleeps in a lift chair most of the time  ADL's / Homemaking Assistance Needed: assist with meals and bathing from CAP aide 3 hours/day                Extremity/Trunk Assessment   Upper Extremity Assessment: Defer to OT evaluation  Lower Extremity Assessment: Generalized weakness (pt is s/p right hip replacement)      Cervical / Trunk Assessment: Kyphotic  Communication   Communication: No difficulties  Cognition Arousal/Alertness: Awake/alert Behavior During Therapy: WFL for tasks assessed/performed Overall Cognitive Status: Within Functional Limits for tasks assessed                                    Assessment/Plan    PT Assessment Patient needs continued PT services  PT Diagnosis Difficulty walking;Generalized weakness   PT Problem List Decreased strength;Decreased activity tolerance;Decreased mobility;Cardiopulmonary status limiting activity;Pain;Obesity  PT Treatment Interventions Functional mobility training;Therapeutic exercise   PT Goals (Current goals can be found in the Care Plan section) Acute Rehab PT Goals Patient Stated Goal: none stated PT Goal Formulation: With patient Time For Goal  Achievement: 05/14/15 Potential to Achieve Goals: Fair    Frequency Min 3X/week   Barriers to discharge Decreased caregiver support                     End of Session Equipment Utilized During Treatment: Gait belt Activity Tolerance: Patient tolerated treatment well Patient left:  (left on [facility] with RN assist) Nurse Communication: Mobility status    Functional Assessment Tool Used: clinical judgement Functional Limitation: Mobility: Walking and moving around Mobility: Walking and Moving Around Current Status [phone]): At least 80 percent but less than 100 percent impaired, limited or restricted Mobility: Walking and Moving Around Goal Status [phone]): At least 60 percent but less than 80 percent impaired, limited or restricted    Time: 0981-1914 PT Time Calculation (min) (ACUTE ONLY): 49 min   Charges:   PT Evaluation $Initial PT Evaluation Tier I: 1 Procedure     PT G Codes:   PT G-Codes **NOT FOR INPATIENT CLASS** Functional Assessment Tool Used: clinical judgement Functional Limitation: Mobility: Walking and moving around Mobility: Walking and Moving Around Current Status ([id number]): At least 80 percent but less than 100 percent impaired, limited or restricted Mobility: Walking and Moving Around Goal Status [phone]): At least 60 percent but less than 80 percent impaired, limited or restricted    [name], [name]  PT 04/30/2015, 11:02 AM [phone]

## 2015-04-30 NOTE — Consult Note (Signed)
CARDIOLOGY CONSULT NOTE   Patient ID: [name] MRN: [id number] DOB/AGE: 1931-09-24 81 y.o.  Admit Date: 04/29/2015 Referring Physician: Butch Penny MD Primary Physician: Alice Reichert, MD Consulting Cardiologist: Wyline Mood. Christiane Ha MD Primary Cardiologist Jerral Bonito MD Reason for Consultation: CHF. Positive Troponin   Clinical Summary Ms. Donlon is a 81 y.o.female with known history of diastolic CHF, bradycardia with PPM, 2013, pulmonary hypertension, chronic leg cramps. She presented to ER with worsening leg cramps, and LEE. She states the cramping in her legs has gotten worse, so she has been eating a tablespoon of mustard and recently a tablespoon or more of pickle juice to help with the cramping. She denies chest pain or DOE. She has severe scoliosis and is unable to lie flat in the bed. The LEE has been chronic but has gotten worse. She wears support hose to "make my legs stronger."    In ER BP 102/37, Troponin 0.04, Hgb 11.2, Hct 34.1, BUN 26, Creatinine 0.97. Lumbar spine demonstrating severe degenerative scoliosis in the right mid lumbar region.CXR cardiomegaly with mild vascular congestion. EKG with paced rhythm and conduction delay. Potassium 4.0, Magnesium 2.0. She was treated with IV lasix 40 mg X1 in ER. Wt in Dr. Myrtis Ser' office was 171 in May. Admission wt was 176 lbs. She has diuresed 1.4 liters overnight and additional 1 liter today so far.   She states she is feeling some better but legs still cramping.    Allergies  Allergen Reactions  . Aspirin Rash and Other (See Comments)    GI distress  . Tramadol Other (See Comments)    GI distress    Medications Scheduled Medications: . carvedilol  3.125 mg Oral BID WC  . furosemide  80 mg Intravenous Q12H  . sodium chloride  3 mL Intravenous Q12H      PRN Medications: sodium chloride, HYDROcodone-acetaminophen, sodium chloride   Past Medical History  Diagnosis Date  . Edema     . EF 65-70%, echo, November 29, 2010  . Pulmonary hypertension      46 mmHg... echo... January, 2012  . Cellulitis     Superficial cellulitis right lower leg   January 14, 2011  . AV block      2:1  January 14, 2011 / pacemaker plan when cellulitis resolved in length  . Bradycardia     2:1  AV block  . Hypokalemia     October, 2012  . Ejection fraction     EF 65%, echo, January, 2012  . Murmur     No significant valvular disease by echo, January, 2012  . Pacemaker     January, 2013  . Chronic diastolic CHF (congestive heart failure)   . Hypertension   . Arthritis     "qwhere"    Past Surgical History  Procedure Laterality Date  . Hip arthroplasty Right   . Vaginal hysterectomy      "partial"  . Cholecystectomy    . Insert / replace / remove pacemaker  11/2011  . Permanent pacemaker insertion N/A 12/09/2011    Procedure: PERMANENT PACEMAKER INSERTION;  Surgeon: Marinus Maw, MD;  Location: [facility] CATH LAB;  Service: Cardiovascular;  Laterality: N/A;    Family History  Problem Relation Age of Onset  . Cardiomyopathy Father   . Diabetes Father   . Heart failure Father   . Cancer       son  . Coronary artery disease      son  . Dementia  Mother     Social History Ms. Duprey reports that she has never smoked. She has never used smokeless tobacco. Ms. Cade reports that she does not drink alcohol.  Review of Systems Complete review of systems are found to be negative unless outlined in H&P above.  Physical Examination Blood pressure 144/87, pulse 79, temperature 97.8 F (36.6 C), temperature source Oral, resp. rate 20, height  (1.499 m), weight 176 lb 12.8 oz (80.196 kg), SpO2 100 %.  Intake/Output Summary (Last 24 hours) at 04/30/15 1144 Last data filed at 04/30/15 1000  Gross per 24 hour  Intake      0 ml  Output   2450 ml  Net  -2450 ml    Telemetry:None  GEN: Resting without significant complaint. Legs still bothering her.  HEENT: Conjunctiva and lids normal, oropharynx clear  with moist mucosa. Neck: Supple, no elevated JVP or carotid bruits, no thyromegaly. Lungs:Some crackles in the bases. Cardiac: Regular rate and rhythm, distant, no S3 or significant systolic murmur, no pericardial rub. Abdomen: Soft, nontender, no hepatomegaly, bowel sounds present, no guarding or rebound. Extremities:2+pitting edema, reddened skin discoloration with wrinkling and very thin skin. distal pulses 2+. Skin: Warm and dry. Musculoskeletal: Scoliosis, Neuropsychiatric: Alert and oriented x3, affect grossly appropriate.  Prior Cardiac Testing/Procedures 1.Echocardiogram 11/29/2010 Left ventricle: The cavity size was normal. There was mild  concentric hypertrophy. Systolic function was vigorous. The  estimated ejection fraction was in the range of 65% to 70%. Wall  motion was normal; there were no regional wall motion  abnormalities. - Mitral valve: Calcified annulus. - Left atrium: The atrium was mildly dilated. - Right ventricle: The cavity size was normal. Wall thickness was  mildly increased. - Atrial septum: No defect or patent foramen ovale was identified. - Pulmonary arteries: Systolic pressure was mildly increased. PA  peak pressure: 46mm Hg (S).  Pacemaker Interogation 01/13/2015. Miscellaneous Comment  Pacemaker check in clinic. Normal device function. Thresholds, sensing, impedances consistent with previous measurements. Device programmed to maximize longevity. 1,818 mode switches-- longest 19:51, pk A 174, pk V 120-- available EGMs show pacemaker  wenkebach. 2 high ventricular rates noted-- longest 13 beats, pk A 92, pk V 248. UTR increased to 140bpm per GT. Device programmed at appropriate safety margins. Histogram distribution appropriate for patient activity level. Device programmed to optimize  intrinsic conduction. Estimated longevity 5.5-8.5 years. Pt not interested in remote monitoring. 6 months in device clinic, ROV with GT in 12 months. Patient  education completed.       Lab Results  Basic Metabolic Panel:  Recent Labs Lab 04/23/15 2037 04/30/15 0040 04/30/15 0638  NA 140 139  --   K 3.7 4.0  --   CL 102 102  --   CO2 30 27  --   GLUCOSE 93 102*  --   BUN 28* 26*  --   CREATININE 0.91 0.97  --   CALCIUM 9.2 9.2  --   MG  --   --  2.0    Liver Function Tests:  Recent Labs Lab 04/30/15 0040  AST 24  ALT 10*  ALKPHOS 90  BILITOT 0.5  PROT 7.4  ALBUMIN 3.6    CBC:  Recent Labs Lab 04/23/15 2037 04/30/15 0040  WBC 7.1 6.6  NEUTROABS  --  3.9  HGB 10.4* 11.2*  HCT 31.9* 34.1*  MCV 89.6 89.5  PLT 305 264    Cardiac Enzymes:  Recent Labs Lab 04/30/15 0040 04/30/15 0250 04/30/15 5284  CKTOTAL  92  --   --   TROPONINI 0.04* 0.04* 0.04*    Radiology: Dg Chest 1 View  04/30/2015   CLINICAL DATA:  Leg cramping.  History of CHF.  EXAM: CHEST  1 VIEW  COMPARISON:  08/06/2014.  FINDINGS: Cardiomegaly. Dual lead pacer. Slight vascular congestion. Thoracic atherosclerosis. No overt failure or infiltrates. Osseous demineralization. Degenerative change both shoulders.  IMPRESSION: Cardiomegaly with mild vascular congestion. Slight worsening aeration compared with priors.   Electronically Signed   By: Davonna Belling M.D.   On: 04/30/2015 02:07   Dg Lumbar Spine Complete  04/30/2015   CLINICAL DATA:  Low back pain.  Swelling in the legs.  EXAM: LUMBAR SPINE - COMPLETE 4+ VIEW  COMPARISON:  09/24/2010 lumbar spine.  FINDINGS: Severe degenerative scoliosis convex RIGHT mid lumbar region. Advanced disc space narrowing with vacuum phenomenon L3-L4 and L2-L3. No worrisome osseous lesion. No subluxation. Osseous demineralization.  IMPRESSION: Chronic changes as described.  Slight worsening since 2011.   Electronically Signed   By: Davonna Belling M.D.   On: 04/30/2015 02:03   Dg Pelvis 1-2 Views  04/30/2015   CLINICAL DATA:  Intermittent cramping in the legs and feet. No acute symptoms.  EXAM: PELVIS - 1-2 VIEW  COMPARISON:   09/24/10.  FINDINGS: RIGHT total hip arthroplasty uncomplicated. Progressive severe joint space narrowing with femoral head and neck deformity on the LEFT, significant worsening since 2011.  IMPRESSION: Progressive and severe degenerative change LEFT hip. Uncomplicated appearing RIGHT THA.   Electronically Signed   By: Davonna Belling M.D.   On: 04/30/2015 02:06     ECG: SR with intraventricular conduction delay, PVC's.   Impression and Recommendations  1.Acute on Chronic Diastolic CHF: Likely related to salt intake in an effort to relive leg cramps which are chronic for her, but have worsened. She has increased weight and edema as a result. She is diuresing well on current diuretic regimen. Creatinine is 0.97, CO2 is 27. She may have a lower dry wt than 171 lbs as she was edematous in Dr.Katz' clinic one month ago. Will continue to diurese. Positive troponin most likely related to demand ischemia. No planned ischemic cardiac testing for now.   2. Leg Cramps: Chronic for her. Mg and potassium are within normal limits. However, low dose magnesium supplement may help her. This can be related to her severe scoliosis as well. No evidence for PAD on exam, as she has palpable pulses although diminished.   3.Pacemaker in situ; Last interrogation was in Feb of 2016 in the office. She will continue follow ups as scheduled.       Signed: Bettey Mare. Lawrence NP [facility]  04/30/2015, 11:44 AM Co-Sign MD  Patient seen and discussed with NP Lyman Bishop, I agree with her documentation above. 81 yo female hx of chronic diastolic HF, HTN, bradycardia with PPM, admitted with leg cramps and overall diffuse body cramps. Unclear indication from history, however troponin was checked and found to be just above detection limit at 0.04.    Home weight and last clinic visit weight 171 lbs ER vitals bp 144/87 p 79 Wt 176 lbs BNP 81, trop 0.04 and flat x 3, Cr 0.97, BUN 26, Hgb 11.2, Plt 264,  CXR mid vascular  congestion EKG A sensed, V-paced Jan 2012 echo: LVEF 65-60%, no WMAs, PASP 46   Acute on chronic diastolic HF, with mild congestion of CXR. Fairly unimpressive BNP level, weight up 5 lbs this admit vs clinic scales but unclear how much  of that is variability in scales and actual fluid weight gain. She is likely mildly volume overloaded. She received lasix 40mg  IV twice early this AM, negative 2.2 liters since admission, repeat labs pending. I suspect she likely is nearly back to euvolemia, will given lasix 40mg  this evening and then covert back to her oral regimen tomorrow. Very mild flat trop elevation just above level of detection at 0.04 likely to her her diastolic HF, no plans for ischemic testing at this time. Will sign off inpatient care, from cardiac standpoint consider discharge tomorrow back on her regular diuretic regiment.  Dominga Ferry MD

## 2015-04-30 NOTE — Care Management Note (Signed)
Case Management Note  Patient Details  Name: [name] MRN: [id number] Date of Birth: 1931-03-28  Subjective/Objective:                  Pt admitted from home with CHF and elevated troponins. Pt lives at home and has hired caregivers for several hours a day and then family comes to stay with pt. Pt is alone at night. Pt has a walker and BSC for home use. Action/Plan: PT is recommending SNF at discharge. Pt is very adamant that she will be returning home at discharge. Pts son and daughter are aware that pt is stating the she is not going to SNF. They understand that pt is competent to make that decision. Pt is agreeable to [facility] with [facility]. Will send referral to [facility]. Will continue to follow for discharge planning needs.  Expected Discharge Date:                  Expected Discharge Plan:  Home w Home Health Services  In-House Referral:  Clinical Social Work  Discharge planning Services  CM Consult  Post Acute Care Choice:  Home Health Choice offered to:  Patient  DME Arranged:    DME Agency:     HH Arranged:  PT HH Agency:  [facility]  Status of Service:  In process, will continue to follow  Medicare Important Message Given:    Date Medicare IM Given:    Medicare IM give by:    Date Additional Medicare IM Given:    Additional Medicare Important Message give by:     If discussed at Long Length of Stay Meetings, dates discussed:    Additional Comments:  Cheryl Flash, RN 04/30/2015, 3:57 PM

## 2015-04-30 NOTE — H&P (Signed)
PCP:   Alice Reichert, MD   Chief Complaint:  Leg cramps  HPI: 81 yo female h/o chronic distolic chf, htn, chronic le edema comes in with problems with leg cramps for the last several days.  She went to the [facility] ED couple of days ago they gave her a vicodin and she started taking some mustard .  But tonight the leg cramps got really bad again, and its affecting her walking.  At her baseline she walks with a walker.  Lives alone but her kids come every day and she has a nurses aid several hours a day.  She denies any sob.  Her legs she reports look really good for her.  No fevers.  No n/v/d.  No chest pain.  No sob, pnd or orthopnea.  A trop was checked in the ED which is mildly elevated, and her cxr reveals some edema.  Family is wishing for her to go to snf, but she refuses to do so.  We are asked to admit pt for her positive troponin and PT evaluation.  Review of Systems:  Positive and negative as per HPI otherwise all other systems are negative  Past Medical History: Past Medical History  Diagnosis Date  . Edema     . EF 65-70%, echo, November 29, 2010  . Pulmonary hypertension      46 mmHg... echo... January, 2012  . Cellulitis     Superficial cellulitis right lower leg   January 14, 2011  . AV block      2:1  January 14, 2011 / pacemaker plan when cellulitis resolved in length  . Bradycardia     2:1  AV block  . Hypokalemia     October, 2012  . Ejection fraction     EF 65%, echo, January, 2012  . Murmur     No significant valvular disease by echo, January, 2012  . Pacemaker     January, 2013  . Chronic diastolic CHF (congestive heart failure)   . Hypertension   . Arthritis     "qwhere"   Past Surgical History  Procedure Laterality Date  . Hip arthroplasty Right   . Vaginal hysterectomy      "partial"  . Cholecystectomy    . Insert / replace / remove pacemaker  11/2011  . Permanent pacemaker insertion N/A 12/09/2011    Procedure: PERMANENT PACEMAKER  INSERTION;  Surgeon: Marinus Maw, MD;  Location: [facility] CATH LAB;  Service: Cardiovascular;  Laterality: N/A;    Medications: Prior to Admission medications   Medication Sig Start Date End Date Taking? Authorizing Provider  Acetaminophen-Codeine 300-30 MG per tablet Take 1 tablet by mouth 2 (two) times daily as needed for pain. For leg pain 04/06/15   Luis Abed, MD  carvedilol (COREG) 3.125 MG tablet Take 3.125 mg by mouth 2 (two) times daily with a meal.    Historical Provider, MD  Cholecalciferol (VITAMIN D3) 3000 UNITS TABS Take 1 capsule by mouth daily.    Historical Provider, MD  cyclobenzaprine (FLEXERIL) 5 MG tablet Take 5 mg by mouth 2 (two) times daily as needed for muscle spasms (muscle spasms). For muscle spasm.    Historical Provider, MD  furosemide (LASIX) 20 MG tablet Take 3 tablets (60 mg total) by mouth 2 (two) times daily. 01/19/15   Luis Abed, MD  magnesium oxide (MAG-OX) 400 (241.3 MG) MG tablet TAKE ONE TABLET BY MOUTH ONE TIME DAILY 04/02/15   Luis Abed, MD  meloxicam (MOBIC) 15 MG tablet Take 15 mg by mouth daily as needed for pain (pain).     Historical Provider, MD  metolazone (ZAROXOLYN) 2.5 MG tablet Take 1 tab daily as needed for daily weight of 172 lbs or greater 04/10/15   Luis Abed, MD  Multiple Vitamins-Minerals (MULTIVITAMINS THER. W/MINERALS) TABS Take 1 tablet by mouth daily.      Historical Provider, MD  nitroGLYCERIN (NITROSTAT) 0.4 MG SL tablet Place 0.4 mg under the tongue every 5 (five) minutes as needed for chest pain.    Historical Provider, MD  potassium chloride SA (K-DUR,KLOR-CON) 20 MEQ tablet TAKE 3 TABLETS (60 MEQ TOTAL) BY MOUTH DAILY. 04/02/15   Luis Abed, MD    Allergies:   Allergies  Allergen Reactions  . Aspirin Rash and Other (See Comments)    GI distress  . Tramadol Other (See Comments)    GI distress    Social History:  reports that she has never smoked. She has never used smokeless tobacco. She reports that she  does not drink alcohol or use illicit drugs.  Family History: Family History  Problem Relation Age of Onset  . Cardiomyopathy Father   . Diabetes Father   . Heart failure Father   . Cancer       son  . Coronary artery disease      son  . Dementia Mother     Physical Exam: Filed Vitals:   04/30/15 0000  BP: 102/37  Pulse: 82  Temp: 98.2 F (36.8 C)  Resp: 18  Height: 5' (1.524 m)  Weight: 77.111 kg (170 lb)  SpO2: 100%   General appearance: alert, cooperative and no distress Head: Normocephalic, without obvious abnormality, atraumatic Eyes: negative Nose: Nares normal. Septum midline. Mucosa normal. No drainage or sinus tenderness. Neck: no JVD and supple, symmetrical, trachea midline Lungs: clear to auscultation bilaterally Heart: regular rate and rhythm, S1, S2 normal, no murmur, click, rub or gallop Abdomen: soft, non-tender; bowel sounds normal; no masses,  no organomegaly Extremities: chronic le edema/changes trace pitting edema Pulses: 2+ and symmetric Skin: Skin color, texture, turgor normal. No rashes or lesions Neurologic: Grossly normal   Labs on Admission:   Recent Labs  04/30/15 0040  NA 139  K 4.0  CL 102  CO2 27  GLUCOSE 102*  BUN 26*  CREATININE 0.97  CALCIUM 9.2    Recent Labs  04/30/15 0040  AST 24  ALT 10*  ALKPHOS 90  BILITOT 0.5  PROT 7.4  ALBUMIN 3.6    Recent Labs  04/30/15 0040  WBC 6.6  NEUTROABS 3.9  HGB 11.2*  HCT 34.1*  MCV 89.5  PLT 264    Recent Labs  04/30/15 0040 04/30/15 0250  CKTOTAL 92  --   TROPONINI 0.04* 0.04*   Radiological Exams on Admission: Dg Chest 1 View  04/30/2015   CLINICAL DATA:  Leg cramping.  History of CHF.  EXAM: CHEST  1 VIEW  COMPARISON:  08/06/2014.  FINDINGS: Cardiomegaly. Dual lead pacer. Slight vascular congestion. Thoracic atherosclerosis. No overt failure or infiltrates. Osseous demineralization. Degenerative change both shoulders.  IMPRESSION: Cardiomegaly with mild  vascular congestion. Slight worsening aeration compared with priors.   Electronically Signed   By: Davonna Belling M.D.   On: 04/30/2015 02:07   Dg Lumbar Spine Complete  04/30/2015   CLINICAL DATA:  Low back pain.  Swelling in the legs.  EXAM: LUMBAR SPINE - COMPLETE 4+ VIEW  COMPARISON:  09/24/2010 lumbar spine.  FINDINGS: Severe degenerative scoliosis convex RIGHT mid lumbar region. Advanced disc space narrowing with vacuum phenomenon L3-L4 and L2-L3. No worrisome osseous lesion. No subluxation. Osseous demineralization.  IMPRESSION: Chronic changes as described.  Slight worsening since 2011.   Electronically Signed   By: Davonna Belling M.D.   On: 04/30/2015 02:03   Dg Pelvis 1-2 Views  04/30/2015   CLINICAL DATA:  Intermittent cramping in the legs and feet. No acute symptoms.  EXAM: PELVIS - 1-2 VIEW  COMPARISON:  09/24/10.  FINDINGS: RIGHT total hip arthroplasty uncomplicated. Progressive severe joint space narrowing with femoral head and neck deformity on the LEFT, significant worsening since 2011.  IMPRESSION: Progressive and severe degenerative change LEFT hip. Uncomplicated appearing RIGHT THA.   Electronically Signed   By: Davonna Belling M.D.   On: 04/30/2015 02:06   cxr reviewed mild edema ekg reviewed paced Old records reviewed Case discussed with edp dr rancour  Assessment/Plan  81 yo female with leg cramps and elevated troponin  Principal Problem:   Elevated troponin-  Repeat later this am.  Do not think pt is having a significant cardiac event at this time.  Active Problems:   Cardiac pacemaker- noted   Chronic diastolic CHF (congestive heart failure)- compensated at this time without significant exacerbation.  Will leave further w/u with echo to PCP, last one here 2012.  Will increase her lasix for the next 24 hours.   FTT (failure to thrive) in adult-  PT evaluation   Chronic edema- stable  Pt refuses snf and refuses short term rehab.  Not getting PT at home.  Family frustrated.   Check mag level for her leg cramps.  Lessie Funderburke A 04/30/2015, 4:07 AM

## 2015-04-30 NOTE — Telephone Encounter (Signed)
LM on VM for patient's daughter, [name], to inform her that both Dr. Myrtis Ser and his nurse, Larita Fife Via, are out of the office today, so they will be unable to return her call today as requested by the patient's daughter on 6/7.  Meanwhile, the hospital record indicates that the patient was admitted to [facility] - Arrival date & time 04/29/15 2358.  Final diagnoses:  Peripheral edema  CHF exacerbation         Will route message to Dr. Myrtis Ser and Larita Fife Via, Nurse, as Lorain Childes.

## 2015-04-30 NOTE — Progress Notes (Signed)
Subjective: The patient is more comfortable this morning. She was admitted with leg cramps apparently has chronic diastolic CHF hypertension chronic edema in her legs. Troponin was slightly elevated and x-ray revealed mild pulmonary congestion. Patient was started on IV Lasix  Objective: Vital signs in last 24 hours: Temp:  [97.7 F (36.5 C)-98.2 F (36.8 C)] 97.8 F (36.6 C) (06/09 0518) Pulse Rate:  [76-82] 79 (06/09 0457) Resp:  [18-20] 20 (06/09 0411) BP: (102-144)/(37-87) 144/87 mmHg (06/09 0457) SpO2:  [100 %] 100 % (06/09 0518) Weight:  [77.111 kg (170 lb)-80.196 kg (176 lb 12.8 oz)] 80.196 kg (176 lb 12.8 oz) (06/09 0457) Weight change:  Last BM Date: 04/29/15  Intake/Output from previous day: 06/08 0701 - 06/09 0700 In: -  Out: 800 [Urine:800] Intake/Output this shift: Total I/O In: -  Out: 800 [Urine:800]  Physical Exam: Gen. appearance the patient is alert and oriented  HEENT negative  Neck no JVD or thyroid abnormalities  Lungs clear to P&A  Heart regular rhythm no murmurs  Abdomen no palpable organs or masses  Extremities mild edema bilaterally   Recent Labs  04/30/15 0040  WBC 6.6  HGB 11.2*  HCT 34.1*  PLT 264   BMET  Recent Labs  04/30/15 0040  NA 139  K 4.0  CL 102  CO2 27  GLUCOSE 102*  BUN 26*  CREATININE 0.97  CALCIUM 9.2    Studies/Results: Dg Chest 1 View  04/30/2015   CLINICAL DATA:  Leg cramping.  History of CHF.  EXAM: CHEST  1 VIEW  COMPARISON:  08/06/2014.  FINDINGS: Cardiomegaly. Dual lead pacer. Slight vascular congestion. Thoracic atherosclerosis. No overt failure or infiltrates. Osseous demineralization. Degenerative change both shoulders.  IMPRESSION: Cardiomegaly with mild vascular congestion. Slight worsening aeration compared with priors.   Electronically Signed   By: Davonna Belling M.D.   On: 04/30/2015 02:07   Dg Lumbar Spine Complete  04/30/2015   CLINICAL DATA:  Low back pain.  Swelling in the legs.  EXAM: LUMBAR  SPINE - COMPLETE 4+ VIEW  COMPARISON:  09/24/2010 lumbar spine.  FINDINGS: Severe degenerative scoliosis convex RIGHT mid lumbar region. Advanced disc space narrowing with vacuum phenomenon L3-L4 and L2-L3. No worrisome osseous lesion. No subluxation. Osseous demineralization.  IMPRESSION: Chronic changes as described.  Slight worsening since 2011.   Electronically Signed   By: Davonna Belling M.D.   On: 04/30/2015 02:03   Dg Pelvis 1-2 Views  04/30/2015   CLINICAL DATA:  Intermittent cramping in the legs and feet. No acute symptoms.  EXAM: PELVIS - 1-2 VIEW  COMPARISON:  09/24/10.  FINDINGS: RIGHT total hip arthroplasty uncomplicated. Progressive severe joint space narrowing with femoral head and neck deformity on the LEFT, significant worsening since 2011.  IMPRESSION: Progressive and severe degenerative change LEFT hip. Uncomplicated appearing RIGHT THA.   Electronically Signed   By: Davonna Belling M.D.   On: 04/30/2015 02:06    Medications:  . carvedilol  3.125 mg Oral BID WC  . furosemide  80 mg Intravenous Q12H  . sodium chloride  3 mL Intravenous Q12H        Assessment/Plan:  1 chronic diastolic congestive heart failure with pulmonary congestion-plan to continue IV Lasix will obtain cardiology consult  2. Elevated troponin and obtain cardiologist opinion regarding this and further workup    Rumaldo Difatta G 04/30/2015, 6:21 AM

## 2015-04-30 NOTE — ED Notes (Signed)
   04/30/15 0006  Musculoskeletal  Musculoskeletal (WDL) X  RLE Weakness;Swelling  LLE Swelling;Weakness  Pt c/o bilateral leg spasms. Pt is being treated for cellulitis of bilateral leg and states she needs help with the pain. Pt states the tylenol 3 is not helping.

## 2015-04-30 NOTE — ED Provider Notes (Signed)
CSN: [id number]     Arrival date & time 04/29/15  2358 History  This chart was scribed for Glynn Octave, MD by Evon Slack, ED Scribe. This patient was seen in room APA15/APA15 and the patient's care was started at 12:09 AM.    Chief Complaint  Patient presents with  . Leg Pain    HPI  HPI Comments: [name] is a 81 y.o. female with PMHX edema, cellulitis. AV block, ejection fraction, pacemaker, CHF, HTN and arthritis brought in by ambulance, who presents to the Emergency Department complaining of recurrent intermittent cramping pain in her hands, arms, hips, legs and feet for several months. Pt denies falls or injuries. Pt states that she ambulates with a walker. Pt states that she doesn't walk often due to the swelling in her legs, she states she tries to keep them elevated. Pt reports Hx of right hip replacement.  Pt is also complaining of back pain as well. Denies SOB, CP, abdominal pain, fevers or bowel/bladder incontinence. Pt denies any pain at this time  Denies hx of thrombus   Past Medical History  Diagnosis Date  . Edema     . EF 65-70%, echo, November 29, 2010  . Pulmonary hypertension      46 mmHg... echo... January, 2012  . Cellulitis     Superficial cellulitis right lower leg   January 14, 2011  . AV block      2:1  January 14, 2011 / pacemaker plan when cellulitis resolved in length  . Bradycardia     2:1  AV block  . Hypokalemia     October, 2012  . Ejection fraction     EF 65%, echo, January, 2012  . Murmur     No significant valvular disease by echo, January, 2012  . Pacemaker     January, 2013  . Chronic diastolic CHF (congestive heart failure)   . Hypertension   . Arthritis     "qwhere"   Past Surgical History  Procedure Laterality Date  . Hip arthroplasty Right   . Vaginal hysterectomy      "partial"  . Cholecystectomy    . Insert / replace / remove pacemaker  11/2011  . Permanent pacemaker insertion N/A 12/09/2011    Procedure: PERMANENT  PACEMAKER INSERTION;  Surgeon: Marinus Maw, MD;  Location: [facility] CATH LAB;  Service: Cardiovascular;  Laterality: N/A;   Family History  Problem Relation Age of Onset  . Cardiomyopathy Father   . Diabetes Father   . Heart failure Father   . Cancer       son  . Coronary artery disease      son  . Dementia Mother    History  Substance Use Topics  . Smoking status: Never Smoker   . Smokeless tobacco: Never Used  . Alcohol Use: No   OB History    No data available     Review of Systems  Constitutional: Negative for fever.  Respiratory: Negative for shortness of breath.   Cardiovascular: Positive for leg swelling. Negative for chest pain.  Gastrointestinal: Negative for abdominal pain.  Musculoskeletal: Positive for back pain and arthralgias.  All other systems reviewed and are negative.     Allergies  Aspirin and Tramadol  Home Medications   Prior to Admission medications   Medication Sig Start Date End Date Taking? Authorizing Provider  Acetaminophen-Codeine 300-30 MG per tablet Take 1 tablet by mouth 2 (two) times daily as needed for pain. For leg pain  04/06/15   Luis Abed, MD  carvedilol (COREG) 3.125 MG tablet Take 3.125 mg by mouth 2 (two) times daily with a meal.    Historical Provider, MD  Cholecalciferol (VITAMIN D3) 3000 UNITS TABS Take 1 capsule by mouth daily.    Historical Provider, MD  cyclobenzaprine (FLEXERIL) 5 MG tablet Take 5 mg by mouth 2 (two) times daily as needed for muscle spasms (muscle spasms). For muscle spasm.    Historical Provider, MD  furosemide (LASIX) 20 MG tablet Take 3 tablets (60 mg total) by mouth 2 (two) times daily. 01/19/15   Luis Abed, MD  magnesium oxide (MAG-OX) 400 (241.3 MG) MG tablet TAKE ONE TABLET BY MOUTH ONE TIME DAILY 04/02/15   Luis Abed, MD  meloxicam (MOBIC) 15 MG tablet Take 15 mg by mouth daily as needed for pain (pain).     Historical Provider, MD  metolazone (ZAROXOLYN) 2.5 MG tablet Take 1 tab daily as  needed for daily weight of 172 lbs or greater 04/10/15   Luis Abed, MD  Multiple Vitamins-Minerals (MULTIVITAMINS THER. W/MINERALS) TABS Take 1 tablet by mouth daily.      Historical Provider, MD  nitroGLYCERIN (NITROSTAT) 0.4 MG SL tablet Place 0.4 mg under the tongue every 5 (five) minutes as needed for chest pain.    Historical Provider, MD  potassium chloride SA (K-DUR,KLOR-CON) 20 MEQ tablet TAKE 3 TABLETS (60 MEQ TOTAL) BY MOUTH DAILY. 04/02/15   Luis Abed, MD   BP 144/87 mmHg  Pulse 79  Temp(Src) 97.8 F (36.6 C) (Oral)  Resp 20  Ht  (1.499 m)  Wt 176 lb 12.8 oz (80.196 kg)  BMI 35.69 kg/m2  SpO2 100%   Physical Exam  Constitutional: She is oriented to person, place, and time. She appears well-developed and well-nourished. No distress.  HENT:  Head: Normocephalic and atraumatic.  Mouth/Throat: Oropharynx is clear and moist. No oropharyngeal exudate.  Eyes: Conjunctivae and EOM are normal. Pupils are equal, round, and reactive to light.  Neck: Normal range of motion. Neck supple.  No meningismus.  Cardiovascular: Normal rate, regular rhythm, normal heart sounds and intact distal pulses.   No murmur heard. Pulmonary/Chest: Effort normal and breath sounds normal. No respiratory distress. She has no wheezes. She has no rales.  Lungs clear to auscultation.   Abdominal: Soft. There is no tenderness. There is no rebound and no guarding.  Musculoskeletal: Normal range of motion. She exhibits edema. She exhibits no tenderness.  Pitting edema bilaterally to mid shin with erythema, intact distal pulses, no asymmetry, no calf tenderness.   Neurological: She is alert and oriented to person, place, and time. No cranial nerve deficit. She exhibits normal muscle tone. Coordination normal.  No ataxia on finger to nose bilaterally. No pronator drift. 5/5 strength throughout. CN 2-12 intact. Equal grip strength. Sensation intact.   Skin: Skin is warm.  Psychiatric: She has a  normal mood and affect. Her behavior is normal.  Nursing note and vitals reviewed.     ED Course  Procedures (including critical care time) DIAGNOSTIC STUDIES: Oxygen Saturation is 100% on RA, normal by my interpretation.    COORDINATION OF CARE: 12:21 AM-Discussed treatment plan with pt at bedside and pt agreed to plan.     Labs Review Labs Reviewed  CBC WITH DIFFERENTIAL/PLATELET - Abnormal; Notable for the following:    RBC 3.81 (*)    Hemoglobin 11.2 (*)    HCT 34.1 (*)    All other  components within normal limits  COMPREHENSIVE METABOLIC PANEL - Abnormal; Notable for the following:    Glucose, Bld 102 (*)    BUN 26 (*)    ALT 10 (*)    GFR calc non Af Amer 53 (*)    All other components within normal limits  TROPONIN I - Abnormal; Notable for the following:    Troponin I 0.04 (*)    All other components within normal limits  TROPONIN I - Abnormal; Notable for the following:    Troponin I 0.04 (*)    All other components within normal limits  CK  BRAIN NATRIURETIC PEPTIDE    Imaging Review Dg Chest 1 View  04/30/2015   CLINICAL DATA:  Leg cramping.  History of CHF.  EXAM: CHEST  1 VIEW  COMPARISON:  08/06/2014.  FINDINGS: Cardiomegaly. Dual lead pacer. Slight vascular congestion. Thoracic atherosclerosis. No overt failure or infiltrates. Osseous demineralization. Degenerative change both shoulders.  IMPRESSION: Cardiomegaly with mild vascular congestion. Slight worsening aeration compared with priors.   Electronically Signed   By: Davonna Belling M.D.   On: 04/30/2015 02:07   Dg Lumbar Spine Complete  04/30/2015   CLINICAL DATA:  Low back pain.  Swelling in the legs.  EXAM: LUMBAR SPINE - COMPLETE 4+ VIEW  COMPARISON:  09/24/2010 lumbar spine.  FINDINGS: Severe degenerative scoliosis convex RIGHT mid lumbar region. Advanced disc space narrowing with vacuum phenomenon L3-L4 and L2-L3. No worrisome osseous lesion. No subluxation. Osseous demineralization.  IMPRESSION: Chronic  changes as described.  Slight worsening since 2011.   Electronically Signed   By: Davonna Belling M.D.   On: 04/30/2015 02:03   Dg Pelvis 1-2 Views  04/30/2015   CLINICAL DATA:  Intermittent cramping in the legs and feet. No acute symptoms.  EXAM: PELVIS - 1-2 VIEW  COMPARISON:  09/24/10.  FINDINGS: RIGHT total hip arthroplasty uncomplicated. Progressive severe joint space narrowing with femoral head and neck deformity on the LEFT, significant worsening since 2011.  IMPRESSION: Progressive and severe degenerative change LEFT hip. Uncomplicated appearing RIGHT THA.   Electronically Signed   By: Davonna Belling M.D.   On: 04/30/2015 02:06     EKG Interpretation   Date/Time:  Thursday April 30 2015 00:29:59 EDT Ventricular Rate:  85 PR Interval:  210 QRS Duration: 152 QT Interval:  437 QTC Calculation: 520 R Axis:   -74 Text Interpretation:  Sinus rhythm Ventricular premature complex Probable  left atrial enlargement Nonspecific IVCD with LAD LVH with secondary  repolarization abnormality Inferior infarct, acute (RCA) Anterolateral  infarct, old Probable RV involvement, suggest recording right precordial  leads Baseline wander in lead(s) V6 No significant change was found  Confirmed by Jamin Panther  MD, Monterio Bob ([id number]) on 04/30/2015 12:36:02 AM      MDM   Final diagnoses:  Peripheral edema  CHF exacerbation   Acute on chronic bilateral leg pain and swelling. Symptoms have been ongoing on for the past 6 months. Seen last week for same. Denies chest pain or shortness of breath. States legs appear to be the same size and color is usual. No fevers or vomiting.  EKG is unchanged. Labs appear to be at baseline. CK is normal. BNP is normal. Troponin mildly elevated 0.04. No evidence of acute cellulitis or infectious symptoms.  Son states patient lives alone and hasn't been able to walk for several months.  They have an aid that comes a few hours a day.   Second troponin unchanged at 0.04.  Mild  congestion  on CXR. No CP or SOB. Patient continues to decline pain meds.  Son concerned about well being at home. Patient has been resistant to placement in the past.  Unable to ambulate and difficulty caring for self at home. Observation admission d/w Dr. Onalee Hua.    Glynn Octave, MD 04/30/15 [phone]

## 2015-04-30 NOTE — Clinical Social Work Note (Signed)
Clinical Social Work Assessment  Patient Details  Name: [name] MRN: [id number] Date of Birth: 10/08/1931  Date of referral:  04/30/15               Reason for consult:  Facility Placement                Permission sought to share information with:  Family Supports Permission granted to share information::  Yes, Verbal Permission Granted  Name::     [name]  Agency::     Relationship::  son  Contact Information:     Housing/Transportation Living arrangements for the past 2 months:  Single Family Home Source of Information:  Patient, Adult Children Patient Interpreter Needed:  None Criminal Activity/Legal Involvement Pertinent to Current Situation/Hospitalization:  No - Comment as needed Significant Relationships:  Adult Children Lives with:  Self Do you feel safe going back to the place where you live?  Yes Need for family participation in patient care:  Yes (Comment)  Care giving concerns:  Pt feels she has sufficient care at home to return.    Social Worker assessment / plan:  CSW met with pt at bedside. Pt alert and oriented and lives alone. She has supportive children and [name] lives very close to pt and checks on her regularly. Pt reports she feels she manages well at home. She has a friend that stays with her for several hours in the morning and then has an aide for several hours in the afternoon. Pt also said church members will frequently come by to visit and help as needed. She is alone some, but does not feel this is a problem. Pt continues to complain of cramps in her legs and a bad back. She states she takes things "one day at a time." At baseline, pt reports she walks minimally with a walker and otherwise uses a wheelchair. PT evaluated pt today and recommend SNF. CSW discussed this with pt further after pt refused with PT. She did allow CSW to try to share positive parts of considering SNF, but then gave many reasons why she would not go to rehab. Pt likes her  privacy, doesn't feel weak even though she admits she may be, states she can do better at home, etc. Even if family feel strongly that pt needs SNF, she states she will still refuse. She said that she has told her family that she will never go and questioned why she could not receive therapy at home. CSW shared that she could have home health, but that this may not be enough for pt's current condition. She replied, "I know my body." Pt states that if she has to she would go stay with a sister. She took care of her husband and son who died about a month apart years ago. She indicates that her family should help her out more if needed. CSW spoke with pt's son, [name] with permission. He states that all of her children agree that pt needs to be placed somewhere. He understands that pt has been refusing and said, "She is so stubborn. This is a hopeless case." CSW provided support as he understands pt's right to self determination although they do not feel that this is best decision. [name] shared that her daughter in [place] has offered for pt to stay with her but she has refused. CSW suggested mentioning this to pt again as she was willing to go stay with a sister if necessary. [name] requests full home health services   if pt returns home. CM notified. He is aware to contact CSW if pt agrees to consider SNF.   Employment status:  Retired Forensic scientist:  Medicare PT Recommendations:  [place] / Referral to community resources:  [place]   Patient/Family's Response to care:  Pt refuses to consider SNF despite family feeling like it is necessary.   Patient/Family's Understanding of and Emotional Response to Diagnosis, Current Treatment, and Prognosis: Pt expressed reason for admission. She reports understanding of SNF recommendation as part of treatment, but absolutely refuses.   Emotional Assessment Appearance:  Appears stated  age Attitude/Demeanor/Rapport:  Other (cooperative, but very firm on decisions) Affect (typically observed):  Defensive Orientation:  Oriented to Self, Oriented to Place, Oriented to  Time, Oriented to Situation Alcohol / Substance use:  Not Applicable Psych involvement (Current and /or in the community):  No (Comment)  Discharge Needs  Concerns to be addressed:  Discharge Planning Concerns Readmission within the last 30 days:  No Current discharge risk:  Lives alone Barriers to Discharge:  Continued Medical Work up   [employer], [employer], [place] 04/30/2015, 11:21 AM [phone]

## 2015-04-30 NOTE — ED Notes (Signed)
Pt does not ambulate, pt states it has been a long time since she has walked.

## 2015-05-01 DIAGNOSIS — I5032 Chronic diastolic (congestive) heart failure: Secondary | ICD-10-CM | POA: Diagnosis not present

## 2015-05-01 DIAGNOSIS — R600 Localized edema: Secondary | ICD-10-CM | POA: Diagnosis not present

## 2015-05-01 DIAGNOSIS — Z7401 Bed confinement status: Secondary | ICD-10-CM | POA: Diagnosis not present

## 2015-05-01 DIAGNOSIS — R279 Unspecified lack of coordination: Secondary | ICD-10-CM | POA: Diagnosis not present

## 2015-05-01 DIAGNOSIS — G4762 Sleep related leg cramps: Secondary | ICD-10-CM | POA: Diagnosis not present

## 2015-05-01 LAB — BASIC METABOLIC PANEL
Anion gap: 10 (ref 5–15)
BUN: 20 mg/dL (ref 6–20)
CO2: 28 mmol/L (ref 22–32)
CREATININE: 0.81 mg/dL (ref 0.44–1.00)
Calcium: 8.9 mg/dL (ref 8.9–10.3)
Chloride: 99 mmol/L — ABNORMAL LOW (ref 101–111)
GFR calc Af Amer: >60 mL/min (ref >60)
Glucose, Bld: 111 mg/dL — ABNORMAL HIGH (ref 65–99)
Potassium: 3.2 mmol/L — ABNORMAL LOW (ref 3.5–5.1)
Sodium: 137 mmol/L (ref 135–145)

## 2015-05-01 LAB — MAGNESIUM: MAGNESIUM: 1.7 mg/dL (ref 1.7–2.4)

## 2015-05-01 MED ORDER — POTASSIUM CHLORIDE CRYS ER 20 MEQ PO TBCR
20.0000 meq | EXTENDED_RELEASE_TABLET | Freq: Once | ORAL | Status: AC
  Administered 2015-05-01: 20 meq via ORAL
  Filled 2015-05-01: qty 1

## 2015-05-01 MED ORDER — POTASSIUM CHLORIDE 10 MEQ/100ML IV SOLN
10.0000 meq | INTRAVENOUS | Status: AC
Start: 2015-05-01 — End: 2015-05-01
  Administered 2015-05-01 (×2): 10 meq via INTRAVENOUS
  Filled 2015-05-01: qty 100

## 2015-05-01 NOTE — Telephone Encounter (Signed)
Please contact the family to let them know that I want the patient to have his much activity as possible. I am definitely in favor of physical rehabilitation.

## 2015-05-01 NOTE — Discharge Summary (Signed)
Physician Discharge Summary  [name] WUJ:[id number] DOB: [birth year] DOA: 04/29/2015  PCP: Alice Reichert, MD  Admit date: 04/29/2015 Discharge date: 05/01/2015   Follow-up Information    Follow up with [facility].   Contact information:   [address] [place] [address] [phone]       Discharge Diagnoses:  1. Diastolic congestive heart failure with slightly elevated troponin 2. Chronic edema stable 3. Degenerative disc disease L2-L3-L3-L4 4. Degenerative osteoarthritis of left hip-right hip arthroplasty 5. Hypokalemic anemia  Discharge Condition: Stable Disposition: Home  Diet recommendation: 2 g low-sodium diet  Filed Weights   04/30/15 0000 04/30/15 0457 05/01/15 0534  Weight: 77.111 kg (170 lb) 80.196 kg (176 lb 12.8 oz) 76.613 kg (168 lb 14.4 oz)    History of present illness:  The patient was admitted through the ED with a complaint of leg cramps which have been present for several days. The patient lives alone but has family in frequently. The patient had a mildly elevated troponin and ED and chest x-ray revealed mild edema. She was given IV Lasix on admission and began to diurese.  Hospital Course:  The patient in hospital setting continue to diurese became relatively asymptomatic but was still unable to ambulate. She was seen in consultation by cardiology. She has known diastolic congestive heart failure and was felt that the mildly elevated troponin was secondary to her failure. She remained relatively asymptomatic. No aggressive ischemic workup was recommended by cardiology. She is felt to be euvolemic. She was continued on medications listed below. Efforts were made by family and by social services to get her into skilled facility for further rehabilitation with physical therapy. This was unable to be accomplished. It was felt she should be discharged home with home health and physical therapy coming into the home for further  strengthening and ambulation. Patient was stable at the time of her discharge. It was felt she could be prescribed magnesium tablets cramps.   Discharge Instructions The patient is to continue medications listed below. Magnesium oxide 400 mg was prescribed for her to take daily along with her other medications. She will be given appointments to follow with cardiology and primary care physician it was noted that patient had potassium 3.2 and runs of potassium were given prior to discharge    Medication List    STOP taking these medications        meloxicam 15 MG tablet  Commonly known as:  MOBIC      TAKE these medications        Acetaminophen-Codeine 300-30 MG per tablet  Take 1 tablet by mouth 2 (two) times daily as needed for pain. For leg pain     carvedilol 3.125 MG tablet  Commonly known as:  COREG  Take 3.125 mg by mouth 2 (two) times daily with a meal.     cyclobenzaprine 5 MG tablet  Commonly known as:  FLEXERIL  Take 5 mg by mouth 2 (two) times daily as needed for muscle spasms (muscle spasms). For muscle spasm.     furosemide 20 MG tablet  Commonly known as:  LASIX  Take 3 tablets (60 mg total) by mouth 2 (two) times daily.     magnesium oxide 400 (241.3 MG) MG tablet  Commonly known as:  MAG-OX  TAKE ONE TABLET BY MOUTH ONE TIME DAILY     metolazone 2.5 MG tablet  Commonly known as:  ZAROXOLYN  Take 1 tab daily as needed for daily weight  of 172 lbs or greater     multivitamins ther. w/minerals Tabs tablet  Take 1 tablet by mouth daily.     nitroGLYCERIN 0.4 MG SL tablet  Commonly known as:  NITROSTAT  Place 0.4 mg under the tongue every 5 (five) minutes as needed for chest pain.     potassium chloride SA 20 MEQ tablet  Commonly known as:  K-DUR,KLOR-CON  TAKE 3 TABLETS (60 MEQ TOTAL) BY MOUTH DAILY.     Vitamin D3 3000 UNITS Tabs  Take 1 capsule by mouth daily.       Allergies  Allergen Reactions  . Aspirin Rash and Other (See Comments)    GI  distress  . Tramadol Other (See Comments)    GI distress    The results of significant diagnostics from this hospitalization (including imaging, microbiology, ancillary and laboratory) are listed below for reference.    Significant Diagnostic Studies: Dg Chest 1 View  04/30/2015   CLINICAL DATA:  Leg cramping.  History of CHF.  EXAM: CHEST  1 VIEW  COMPARISON:  08/06/2014.  FINDINGS: Cardiomegaly. Dual lead pacer. Slight vascular congestion. Thoracic atherosclerosis. No overt failure or infiltrates. Osseous demineralization. Degenerative change both shoulders.  IMPRESSION: Cardiomegaly with mild vascular congestion. Slight worsening aeration compared with priors.   Electronically Signed   By: Davonna Belling M.D.   On: 04/30/2015 02:07   Dg Lumbar Spine Complete  04/30/2015   CLINICAL DATA:  Low back pain.  Swelling in the legs.  EXAM: LUMBAR SPINE - COMPLETE 4+ VIEW  COMPARISON:  09/24/2010 lumbar spine.  FINDINGS: Severe degenerative scoliosis convex RIGHT mid lumbar region. Advanced disc space narrowing with vacuum phenomenon L3-L4 and L2-L3. No worrisome osseous lesion. No subluxation. Osseous demineralization.  IMPRESSION: Chronic changes as described.  Slight worsening since 2011.   Electronically Signed   By: Davonna Belling M.D.   On: 04/30/2015 02:03   Dg Pelvis 1-2 Views  04/30/2015   CLINICAL DATA:  Intermittent cramping in the legs and feet. No acute symptoms.  EXAM: PELVIS - 1-2 VIEW  COMPARISON:  09/24/10.  FINDINGS: RIGHT total hip arthroplasty uncomplicated. Progressive severe joint space narrowing with femoral head and neck deformity on the LEFT, significant worsening since 2011.  IMPRESSION: Progressive and severe degenerative change LEFT hip. Uncomplicated appearing RIGHT THA.   Electronically Signed   By: Davonna Belling M.D.   On: 04/30/2015 02:06    Microbiology: No results found for this or any previous visit (from the past 240 hour(s)).   Labs: Basic Metabolic Panel:  Recent  Labs Lab 04/30/15 0040 04/30/15 0638 05/01/15 0704  NA 139  --  137  K 4.0  --  3.2*  CL 102  --  99*  CO2 27  --  28  GLUCOSE 102*  --  111*  BUN 26*  --  20  CREATININE 0.97  --  0.81  CALCIUM 9.2  --  8.9  MG  --  2.0 1.7   Liver Function Tests:  Recent Labs Lab 04/30/15 0040  AST 24  ALT 10*  ALKPHOS 90  BILITOT 0.5  PROT 7.4  ALBUMIN 3.6   No results for input(s): LIPASE, AMYLASE in the last 168 hours. No results for input(s): AMMONIA in the last 168 hours. CBC:  Recent Labs Lab 04/30/15 0040  WBC 6.6  NEUTROABS 3.9  HGB 11.2*  HCT 34.1*  MCV 89.5  PLT 264   Cardiac Enzymes:  Recent Labs Lab 04/30/15 0040 04/30/15 0250 04/30/15  1610  CKTOTAL 92  --   --   TROPONINI 0.04* 0.04* 0.04*   BNP: BNP (last 3 results)  Recent Labs  04/30/15 0040  BNP 81.0    ProBNP (last 3 results) No results for input(s): PROBNP in the last 8760 hours.  CBG: No results for input(s): GLUCAP in the last 168 hours.  Principal Problem:   Elevated troponin Active Problems:   Cardiac pacemaker   Chronic diastolic CHF (congestive heart failure)   FTT (failure to thrive) in adult   Chronic edema   Peripheral edema   Acute on chronic diastolic heart failure   Time coordinating discharge: 45 minutes  Signed:  Butch Penny, MD 05/01/2015, 4:45 PM

## 2015-05-01 NOTE — Clinical Social Work Note (Signed)
CSW met with pt this morning to discuss d/c planning further. Per CM, pt is observation and ready for d/c today according to MD. CSW shared this with pt and notified her that she will not meet qualifying 3 day stay requirement for SNF. She states that she cannot afford private pay and plans to return home. CSW discussed this with pt's daughter, Basilia Jumbo as well with pt's permission. [name] was disappointed, but understands. CSW mentioned ALF/FCH as a possible option, but pt refused and said she could not afford this. Per [place], pt could afford some facilities, but it would use her entire check and she has bills to pay. CSW agreed to leave ALF list in pt's room in case needed in future. Basilia Jumbo is aware that pt will require additional assistance at home and will discuss with her siblings to arrange. She also mentioned that she could take leave from work if absolutely necessary. CM discussed home health with pt. CSW will sign off, but can be reconsulted if needed.   Benay Pike, [place]

## 2015-05-01 NOTE — Care Management Note (Signed)
Case Management Note  Patient Details  Name: [name] MRN: [id number] Date of Birth: 1931-03-16  Subjective/Objective:                    Action/Plan:   Expected Discharge Date:                  Expected Discharge Plan:  Home w Home Health Services  In-House Referral:  Clinical Social Work  Discharge planning Services  CM Consult  Post Acute Care Choice:  Home Health Choice offered to:  Patient  DME Arranged:  Bedside commode DME Agency:  [facility]  HH Arranged:  PT, RN, Nurse's Aide (CSW) HH Agency:  [facility]  Status of Service:  Completed, signed off  Medicare Important Message Given:    Date Medicare IM Given:    Medicare IM give by:    Date Additional Medicare IM Given:    Additional Medicare Important Message give by:     If discussed at Long Length of Stay Meetings, dates discussed:    Additional Comments: Pt and pts daughter is aware that pt is unable to be placed in SNF due to OBS status. HH RN, PT, aide, and CSW arranged with [facility] per pts daughter choice. Alroy Bailiff of [facility] is aware and will collect the pts information from the chart. HH services to start within 48 hours of discharge. Pt does have an aide daily that is private pay. Pt also requested new BSC and that has been delivered to pts room from [facility]. Pts daughter and pts nurse aware of discharge arrangements. Arlyss Queen [place], RN 05/01/2015, 1:04 PM

## 2015-05-01 NOTE — Progress Notes (Signed)
Physical Therapy Treatment Patient Details Name: YARICZA YOUNES MRN: [id number] DOB: 10/22/1931 Today's Date: 05/01/2015    History of Present Illness 81 yo female h/o chronic distolic chf, htn, chronic le edema comes in with problems with leg cramps for the last several days. She went to the  ED couple of days ago they gave her a vicodin and she started taking some mustard . But tonight the leg cramps got really bad again, and its affecting her walking. At her baseline she walks with a walker. Lives alone but her kids come every day and she has a nurses aid several hours a day. She denies any sob. Her legs she reports look really good for her. No fevers. No n/v/d. No chest pain. No sob, pnd or orthopnea. A trop was checked in the ED which is mildly elevated, and her cxr reveals some edema. Family is wishing for her to go to snf, but she refuses to do so. We are asked to admit pt for her positive troponin and PT evaluation.    PT Comments    Pt was alert and very cooperative this morning.  She is still c/o leg cramps.  The entire visit was focused on developing her transfer ability as we now know that she will be returning home at d/c.  Pt sleeps in a lift chair and she was unable to transfer from supine to sit without full assist.  She has her own methods at home of transferring from sit to stand and we tried to mimic this setting as much as possible.  She was still very fearful of falling and felt that her feet were going to slide.  I fully braced her feet and elevated the bed but she was still unable to get up on her feet.  My impression of her at home is that her mobility is very compromised at best.  So, it may be that once she is in familiar surroundings she will return to this level of mobility.  She will need max family support and HHPT.  Follow Up Recommendations  Home health PT     Equipment Recommendations  3in1 (PT)    Recommendations for Other Services   none     Precautions / Restrictions Precautions Precautions: Fall Restrictions Weight Bearing Restrictions: No    Mobility  Bed Mobility Overal bed mobility: Needs Assistance Bed Mobility: Supine to Sit     Supine to sit: Total assist     General bed mobility comments: pt normally does not sleep in a bed...she sleeps in her lift chair and therefore her ability to get out of bed is a mute point  Transfers Overall transfer level: Needs assistance Equipment used: Standard walker Transfers: Sit to/from Stand Sit to Stand: Total assist;+2 physical assistance         General transfer comment: we were unable to get pt to achieve stance...she was very fearful of her feet sliding but even when her feet were braced she did not trust that they would not slide  Ambulation/Gait             General Gait Details: unable to ambulate                                                          Cognition Arousal/Alertness: Awake/alert  Behavior During Therapy: WFL for tasks assessed/performed Overall Cognitive Status: Within Functional Limits for tasks assessed                                    Pertinent Vitals/Pain Pain Assessment: No/denies pain    Home Living                      Prior Function            PT Goals (current goals can now be found in the care plan section) Progress towards PT goals: Not progressing toward goals - comment (pt very fearful of falling)    Frequency       PT Plan Discharge plan needs to be updated;Equipment recommendations need to be updated    Co-evaluation             End of Session Equipment Utilized During Treatment: Gait belt Activity Tolerance: Patient tolerated treatment well Patient left: in bed     Time: 1610-9604 PT Time Calculation (min) (ACUTE ONLY): 28 min  Charges:  $Therapeutic Activity: 23-37 mins                    G CodesSabreen, Kitchen 05/15/2015, 9:23 AM

## 2015-05-01 NOTE — Progress Notes (Signed)
Subjective: The patient had a fairly comfortable night. She was admitted with leg cramps and apparently has chronic diastolic congestive heart failure and hypertension. Troponin was slightly elevated and x-ray showed mild pulmonary congestion. She was diuresed and is become euvolemic and stable but still has low back discomfort  Objective: Vital signs in last 24 hours: Temp:  [97.9 F (36.6 C)-98.8 F (37.1 C)] 97.9 F (36.6 C) (06/10 0534) Pulse Rate:  [82-84] 84 (06/10 0534) Resp:  [18-20] 18 (06/10 0534) BP: (114-154)/(51-61) 154/61 mmHg (06/10 0534) SpO2:  [96 %-100 %] 97 % (06/10 0534) Weight:  [76.613 kg (168 lb 14.4 oz)] 76.613 kg (168 lb 14.4 oz) (06/10 0534) Weight change: -0.499 kg (-1 lb 1.6 oz) Last BM Date: 04/29/15  Intake/Output from previous day: 06/09 0701 - 06/10 0700 In: 720 [P.O.:720] Out: 2300 [Urine:2300] Intake/Output this shift: Total I/O In: -  Out: 600 [Urine:600]  Physical Exam: General appearance patient is alert and oriented  HEENT negative  Neck supple no JVD or thyroid abnormalities  Lungs clear to P&A  Heart regular rhythm no murmurs  Abdomen the palpable organs or masses  Extremities show mild edema bilaterally   Recent Labs  04/30/15 0040  WBC 6.6  HGB 11.2*  HCT 34.1*  PLT 264   BMET  Recent Labs  04/30/15 0040  NA 139  K 4.0  CL 102  CO2 27  GLUCOSE 102*  BUN 26*  CREATININE 0.97  CALCIUM 9.2    Studies/Results: Dg Chest 1 View  04/30/2015   CLINICAL DATA:  Leg cramping.  History of CHF.  EXAM: CHEST  1 VIEW  COMPARISON:  08/06/2014.  FINDINGS: Cardiomegaly. Dual lead pacer. Slight vascular congestion. Thoracic atherosclerosis. No overt failure or infiltrates. Osseous demineralization. Degenerative change both shoulders.  IMPRESSION: Cardiomegaly with mild vascular congestion. Slight worsening aeration compared with priors.   Electronically Signed   By: Davonna Belling M.D.   On: 04/30/2015 02:07   Dg Lumbar Spine  Complete  04/30/2015   CLINICAL DATA:  Low back pain.  Swelling in the legs.  EXAM: LUMBAR SPINE - COMPLETE 4+ VIEW  COMPARISON:  09/24/2010 lumbar spine.  FINDINGS: Severe degenerative scoliosis convex RIGHT mid lumbar region. Advanced disc space narrowing with vacuum phenomenon L3-L4 and L2-L3. No worrisome osseous lesion. No subluxation. Osseous demineralization.  IMPRESSION: Chronic changes as described.  Slight worsening since 2011.   Electronically Signed   By: Davonna Belling M.D.   On: 04/30/2015 02:03   Dg Pelvis 1-2 Views  04/30/2015   CLINICAL DATA:  Intermittent cramping in the legs and feet. No acute symptoms.  EXAM: PELVIS - 1-2 VIEW  COMPARISON:  09/24/10.  FINDINGS: RIGHT total hip arthroplasty uncomplicated. Progressive severe joint space narrowing with femoral head and neck deformity on the LEFT, significant worsening since 2011.  IMPRESSION: Progressive and severe degenerative change LEFT hip. Uncomplicated appearing RIGHT THA.   Electronically Signed   By: Davonna Belling M.D.   On: 04/30/2015 02:06    Medications:  . carvedilol  3.125 mg Oral BID WC  . magnesium oxide  400 mg Oral Daily  . sodium chloride  3 mL Intravenous Q12H        Assessment/Plan: 1. Acute on chronic diastolic congestive heart failure-patient patient denies euvolemic has lost some weight-no ischemic cardiac testing is to be scheduled by cardiology  2. Leg cramps undetermined etiology severe scoliosis and degenerative disc disease L2-3 L34 prior right total hip arthroplasty osteoarthritis of hip  Will attempt to get patient placed in nursing facility  [facility] G 05/01/2015, 6:11 AM

## 2015-05-01 NOTE — Telephone Encounter (Signed)
I spoke with the pt's daughter and made her aware of Dr Myrtis Ser recommendation. The pt is being discharged from the hospital tomorrow and they have discussed home health care and physical therapy.

## 2015-05-01 NOTE — Care Management Note (Signed)
Case Management Note  Patient Details  Name: [name] MRN: [id number] Date of Birth: June 30, 1931  Subjective/Objective:                    Action/Plan:   Expected Discharge Date:                  Expected Discharge Plan:  Home w Home Health Services  In-House Referral:  Clinical Social Work  Discharge planning Services  CM Consult  Post Acute Care Choice:  Home Health Choice offered to:  Patient  DME Arranged:  Bedside commode DME Agency:  [facility]  HH Arranged:  PT, RN, Nurse's Aide (CSW) HH Agency:  [facility]  Status of Service:  Completed, signed off  Medicare Important Message Given:    Date Medicare IM Given:    Medicare IM give by:    Date Additional Medicare IM Given:    Additional Medicare Important Message give by:     If discussed at Long Length of Stay Meetings, dates discussed:    Additional Comments: Per physical therapist, pt also has a wheelchair at home. Arlyss Queen [place], RN 05/01/2015, 4:16 PM

## 2015-05-01 NOTE — Progress Notes (Signed)
Pt unable to tolerate IV potassium. Pt refused IV potassium despite lowering rate of potassium. Received verbal order from Dr. Janna Arch to administer 20 mEq po.

## 2015-05-02 ENCOUNTER — Other Ambulatory Visit: Payer: Self-pay | Admitting: Cardiology

## 2015-05-02 NOTE — Progress Notes (Signed)
Pt's IV catheter removed and intact. Pt's IV site clean dry and intact. Discharge instructions and follow up appointments reviewed and discussed with patient. All questions answered and no further questions at this time. Pt will be transported via [facility].

## 2015-05-03 DIAGNOSIS — I10 Essential (primary) hypertension: Secondary | ICD-10-CM | POA: Diagnosis not present

## 2015-05-03 DIAGNOSIS — M150 Primary generalized (osteo)arthritis: Secondary | ICD-10-CM | POA: Diagnosis not present

## 2015-05-03 DIAGNOSIS — I272 Other secondary pulmonary hypertension: Secondary | ICD-10-CM | POA: Diagnosis not present

## 2015-05-03 DIAGNOSIS — I5033 Acute on chronic diastolic (congestive) heart failure: Secondary | ICD-10-CM | POA: Diagnosis not present

## 2015-05-03 DIAGNOSIS — Z950 Presence of cardiac pacemaker: Secondary | ICD-10-CM | POA: Diagnosis not present

## 2015-05-03 DIAGNOSIS — R252 Cramp and spasm: Secondary | ICD-10-CM | POA: Diagnosis not present

## 2015-05-03 DIAGNOSIS — R627 Adult failure to thrive: Secondary | ICD-10-CM | POA: Diagnosis not present

## 2015-05-03 DIAGNOSIS — Z96641 Presence of right artificial hip joint: Secondary | ICD-10-CM | POA: Diagnosis not present

## 2015-05-04 ENCOUNTER — Other Ambulatory Visit: Payer: Self-pay | Admitting: *Deleted

## 2015-05-04 MED ORDER — METOLAZONE 2.5 MG PO TABS: ORAL_TABLET | ORAL | Status: DC

## 2015-05-05 DIAGNOSIS — R627 Adult failure to thrive: Secondary | ICD-10-CM | POA: Diagnosis not present

## 2015-05-05 DIAGNOSIS — M150 Primary generalized (osteo)arthritis: Secondary | ICD-10-CM | POA: Diagnosis not present

## 2015-05-05 DIAGNOSIS — I10 Essential (primary) hypertension: Secondary | ICD-10-CM | POA: Diagnosis not present

## 2015-05-05 DIAGNOSIS — I272 Other secondary pulmonary hypertension: Secondary | ICD-10-CM | POA: Diagnosis not present

## 2015-05-05 DIAGNOSIS — I5033 Acute on chronic diastolic (congestive) heart failure: Secondary | ICD-10-CM | POA: Diagnosis not present

## 2015-05-05 DIAGNOSIS — R252 Cramp and spasm: Secondary | ICD-10-CM | POA: Diagnosis not present

## 2015-05-06 DIAGNOSIS — I272 Other secondary pulmonary hypertension: Secondary | ICD-10-CM | POA: Diagnosis not present

## 2015-05-06 DIAGNOSIS — M150 Primary generalized (osteo)arthritis: Secondary | ICD-10-CM | POA: Diagnosis not present

## 2015-05-06 DIAGNOSIS — I5033 Acute on chronic diastolic (congestive) heart failure: Secondary | ICD-10-CM | POA: Diagnosis not present

## 2015-05-06 DIAGNOSIS — I10 Essential (primary) hypertension: Secondary | ICD-10-CM | POA: Diagnosis not present

## 2015-05-06 DIAGNOSIS — R627 Adult failure to thrive: Secondary | ICD-10-CM | POA: Diagnosis not present

## 2015-05-06 DIAGNOSIS — R252 Cramp and spasm: Secondary | ICD-10-CM | POA: Diagnosis not present

## 2015-05-08 DIAGNOSIS — I10 Essential (primary) hypertension: Secondary | ICD-10-CM | POA: Diagnosis not present

## 2015-05-08 DIAGNOSIS — R252 Cramp and spasm: Secondary | ICD-10-CM | POA: Diagnosis not present

## 2015-05-08 DIAGNOSIS — I272 Other secondary pulmonary hypertension: Secondary | ICD-10-CM | POA: Diagnosis not present

## 2015-05-08 DIAGNOSIS — M150 Primary generalized (osteo)arthritis: Secondary | ICD-10-CM | POA: Diagnosis not present

## 2015-05-08 DIAGNOSIS — I5033 Acute on chronic diastolic (congestive) heart failure: Secondary | ICD-10-CM | POA: Diagnosis not present

## 2015-05-08 DIAGNOSIS — R627 Adult failure to thrive: Secondary | ICD-10-CM | POA: Diagnosis not present

## 2015-05-09 ENCOUNTER — Encounter (HOSPITAL_COMMUNITY): Payer: Self-pay | Admitting: Emergency Medicine

## 2015-05-09 ENCOUNTER — Emergency Department (HOSPITAL_COMMUNITY)
Admission: EM | Admit: 2015-05-09 | Discharge: 2015-05-10 | Disposition: A | Discharge status: Home / Self Care | Payer: Medicare Other | Attending: Emergency Medicine | Admitting: Emergency Medicine

## 2015-05-09 DIAGNOSIS — Z79899 Other long term (current) drug therapy: Secondary | ICD-10-CM | POA: Diagnosis not present

## 2015-05-09 DIAGNOSIS — Z950 Presence of cardiac pacemaker: Secondary | ICD-10-CM | POA: Insufficient documentation

## 2015-05-09 DIAGNOSIS — I10 Essential (primary) hypertension: Secondary | ICD-10-CM | POA: Insufficient documentation

## 2015-05-09 DIAGNOSIS — R011 Cardiac murmur, unspecified: Secondary | ICD-10-CM | POA: Insufficient documentation

## 2015-05-09 DIAGNOSIS — R252 Cramp and spasm: Secondary | ICD-10-CM | POA: Diagnosis not present

## 2015-05-09 DIAGNOSIS — R404 Transient alteration of awareness: Secondary | ICD-10-CM | POA: Diagnosis not present

## 2015-05-09 DIAGNOSIS — I5032 Chronic diastolic (congestive) heart failure: Secondary | ICD-10-CM | POA: Insufficient documentation

## 2015-05-09 DIAGNOSIS — M150 Primary generalized (osteo)arthritis: Secondary | ICD-10-CM | POA: Diagnosis not present

## 2015-05-09 DIAGNOSIS — E876 Hypokalemia: Secondary | ICD-10-CM | POA: Diagnosis not present

## 2015-05-09 DIAGNOSIS — I272 Other secondary pulmonary hypertension: Secondary | ICD-10-CM | POA: Diagnosis not present

## 2015-05-09 DIAGNOSIS — R531 Weakness: Secondary | ICD-10-CM | POA: Diagnosis not present

## 2015-05-09 DIAGNOSIS — Z872 Personal history of diseases of the skin and subcutaneous tissue: Secondary | ICD-10-CM | POA: Insufficient documentation

## 2015-05-09 DIAGNOSIS — I5033 Acute on chronic diastolic (congestive) heart failure: Secondary | ICD-10-CM | POA: Diagnosis not present

## 2015-05-09 DIAGNOSIS — M1990 Unspecified osteoarthritis, unspecified site: Secondary | ICD-10-CM | POA: Insufficient documentation

## 2015-05-09 DIAGNOSIS — M792 Neuralgia and neuritis, unspecified: Secondary | ICD-10-CM | POA: Insufficient documentation

## 2015-05-09 DIAGNOSIS — G629 Polyneuropathy, unspecified: Secondary | ICD-10-CM | POA: Diagnosis not present

## 2015-05-09 DIAGNOSIS — R627 Adult failure to thrive: Secondary | ICD-10-CM | POA: Diagnosis not present

## 2015-05-09 LAB — BASIC METABOLIC PANEL
ANION GAP: 11 (ref 5–15)
BUN: 17 mg/dL (ref 6–20)
CALCIUM: 9 mg/dL (ref 8.9–10.3)
CO2: 25 mmol/L (ref 22–32)
Chloride: 99 mmol/L — ABNORMAL LOW (ref 101–111)
Creatinine, Ser: 0.87 mg/dL (ref 0.44–1.00)
GFR calc Af Amer: >60 mL/min (ref >60)
GFR calc non Af Amer: >60 mL/min (ref >60)
Glucose, Bld: 91 mg/dL (ref 65–99)
Potassium: 4.4 mmol/L (ref 3.5–5.1)
SODIUM: 135 mmol/L (ref 135–145)

## 2015-05-09 LAB — CBC
HEMATOCRIT: 33.9 % — AB (ref 36.0–46.0)
Hemoglobin: 11.2 g/dL — ABNORMAL LOW (ref 12.0–15.0)
MCH: 29.1 pg (ref 26.0–34.0)
MCHC: 33 g/dL (ref 30.0–36.0)
MCV: 88.1 fL (ref 78.0–100.0)
Platelets: 358 10*3/uL (ref 150–400)
RBC: 3.85 MIL/uL — ABNORMAL LOW (ref 3.87–5.11)
RDW: 14.6 % (ref 11.5–15.5)
WBC: 6.9 10*3/uL (ref 4.0–10.5)

## 2015-05-09 LAB — I-STAT TROPONIN, ED: Troponin i, poc: 0.03 ng/mL (ref 0.00–0.08)

## 2015-05-09 LAB — BRAIN NATRIURETIC PEPTIDE: B Natriuretic Peptide: 83.7 pg/mL (ref 0.0–100.0)

## 2015-05-09 MED ORDER — GABAPENTIN 300 MG PO CAPS
300.0000 mg | ORAL_CAPSULE | Freq: Once | ORAL | Status: AC
  Administered 2015-05-10: 300 mg via ORAL
  Filled 2015-05-09: qty 1

## 2015-05-09 MED ORDER — GABAPENTIN 300 MG PO CAPS: 300.0000 mg | ORAL_CAPSULE | Freq: Three times a day (TID) | ORAL | Status: DC

## 2015-05-09 NOTE — ED Notes (Signed)
Family discussing discharge with provider at this time.

## 2015-05-09 NOTE — ED Notes (Signed)
Pt helped off bedpan.

## 2015-05-09 NOTE — ED Notes (Signed)
Secretary called and requested to call [facility] for pt transfer.

## 2015-05-09 NOTE — Discharge Instructions (Signed)
Please monitor for new or worsening signs or symptoms, return if any present. Please contact your primary care provider inform them of your visit and all relevant information. Please request for urgent follow-up evaluation for discussion of long-term care.

## 2015-05-09 NOTE — ED Notes (Signed)
Received pt from home with c/o weakness ongoing since last Wednesday. Pt was admitted to [facility] and discharged last Friday for hypokalemia, weakness, and muscle spasms.

## 2015-05-09 NOTE — ED Provider Notes (Signed)
CSN: [id number]     Arrival date & time 05/09/15  1824 History   First MD Initiated Contact with Patient 05/09/15 1830     Chief Complaint  Patient presents with  . Weakness   HPI   He 81 year old female with a significant past medical history of edema, cellulitis, AV block, ejection fraction, pacemaker, CHF, hypertension, arthritis presents today with complaints of cramps. Patients son and daughter were present during evaluation and provide most of the history. They report for the last 3-4 weeks she's had cramps to sleep throughout her muscles. The report that she's been seen numerous times in the emergency room with similar complaints with the addition of bilateral lower leg swelling and has numerous admissions. Most recently she was admitted on 04/30/2015 with acute on chronic bilateral leg pain and swelling. Patient family notes that when she was discharged she did not follow-up with primary care. Patient denies fever, chills, nausea, vomiting, abdominal pain, changes in urine color clarity or characteristics, change in bowel movements. She maintains adequate by mouth intake. Until recently she was ambulatory, but due to persistent muscular pains she has not been ambulatory. She reports baseline bilateral lower extremity edema, notes that it is improved recently she has not spent significant time on her feet. Was at home alone, family members check in on her, nurse comes by the house 3 times a week.  Past Medical History  Diagnosis Date  . Edema     . EF 65-70%, echo, November 29, 2010  . Pulmonary hypertension      46 mmHg... echo... January, 2012  . Cellulitis     Superficial cellulitis right lower leg   January 14, 2011  . AV block      2:1  January 14, 2011 / pacemaker plan when cellulitis resolved in length  . Bradycardia     2:1  AV block  . Hypokalemia     October, 2012  . Ejection fraction     EF 65%, echo, January, 2012  . Murmur     No significant valvular disease by echo,  January, 2012  . Pacemaker     January, 2013  . Chronic diastolic CHF (congestive heart failure)   . Hypertension   . Arthritis     "qwhere"   Past Surgical History  Procedure Laterality Date  . Hip arthroplasty Right   . Vaginal hysterectomy      "partial"  . Cholecystectomy    . Insert / replace / remove pacemaker  11/2011  . Permanent pacemaker insertion N/A 12/09/2011    Procedure: PERMANENT PACEMAKER INSERTION;  Surgeon: Marinus Maw, MD;  Location: [facility] CATH LAB;  Service: Cardiovascular;  Laterality: N/A;   Family History  Problem Relation Age of Onset  . Cardiomyopathy Father   . Diabetes Father   . Heart failure Father   . Cancer       son  . Coronary artery disease      son  . Dementia Mother    History  Substance Use Topics  . Smoking status: Never Smoker   . Smokeless tobacco: Never Used  . Alcohol Use: No   OB History    No data available     Review of Systems  All other systems reviewed and are negative.   Allergies  Aspirin and Tramadol  Home Medications   Prior to Admission medications   Medication Sig Start Date End Date Taking? Authorizing Provider  Acetaminophen-Codeine 300-30 MG per tablet Take 1 tablet by  mouth 2 (two) times daily as needed for pain. For leg pain Patient taking differently: Take 1 tablet by mouth 2 (two) times daily as needed for pain.  04/06/15  Yes Luis Abed, MD  carvedilol (COREG) 3.125 MG tablet Take 3.125 mg by mouth 2 (two) times daily with a meal.   Yes Historical Provider, MD  cholecalciferol (VITAMIN D) 1000 UNITS tablet Take 1,000 Units by mouth daily.   Yes Historical Provider, MD  cyclobenzaprine (FLEXERIL) 5 MG tablet Take 5 mg by mouth 2 (two) times daily as needed for muscle spasms.    Yes Historical Provider, MD  furosemide (LASIX) 20 MG tablet Take 3 tablets (60 mg total) by mouth 2 (two) times daily. 01/19/15  Yes Luis Abed, MD  magnesium oxide (MAG-OX) 400 (241.3 MG) MG tablet TAKE ONE TABLET BY  MOUTH ONE TIME DAILY 04/02/15  Yes Luis Abed, MD  metolazone (ZAROXOLYN) 2.5 MG tablet Take 1 tab daily as needed for daily weight of 172 lbs or greater Patient taking differently: Take 2.5 mg by mouth daily as needed (for weight of 172 lbs or more).  05/04/15  Yes Luis Abed, MD  Multiple Vitamin (MULTIVITAMIN WITH MINERALS) TABS tablet Take 1 tablet by mouth daily.   Yes Historical Provider, MD  nitroGLYCERIN (NITROSTAT) 0.4 MG SL tablet Place 0.4 mg under the tongue every 5 (five) minutes as needed for chest pain.   Yes Historical Provider, MD  potassium chloride SA (K-DUR,KLOR-CON) 20 MEQ tablet TAKE 3 TABLETS (60 MEQ TOTAL) BY MOUTH DAILY. 04/02/15  Yes Luis Abed, MD  gabapentin (NEURONTIN) 300 MG capsule Take 1 capsule (300 mg total) by mouth 3 (three) times daily. 05/09/15   Annaliah Rivenbark, PA-C   BP 143/61 mmHg  Pulse 88  Temp(Src) 98.2 F (36.8 C) (Oral)  Resp 18  Ht 5\' 4"  (1.626 m)  Wt 168 lb 14 oz (76.6 kg)  BMI 28.97 kg/m2  SpO2 100%    Physical Exam  Constitutional: She is oriented to person, place, and time. She appears well-developed and well-nourished.  HENT:  Head: Normocephalic and atraumatic.  Eyes: Conjunctivae are normal. Pupils are equal, round, and reactive to light. Right eye exhibits no discharge. Left eye exhibits no discharge. No scleral icterus.  Neck: Normal range of motion. No JVD present. No tracheal deviation present.  Cardiovascular: Regular rhythm.   Pulmonary/Chest: Breath sounds normal. No stridor. No respiratory distress. She has no wheezes. She has no rales. She exhibits no tenderness.  Abdominal: Soft. She exhibits no distension and no mass. There is no tenderness. There is no rebound and no guarding.  Musculoskeletal: Normal range of motion.  Bilateral lower extremity edema, equal, no signs of cellulitis. Nontender to palpation.  Neurological: She is alert and oriented to person, place, and time. She has normal strength. No cranial nerve  deficit or sensory deficit. Coordination normal. GCS eye subscore is 4. GCS verbal subscore is 5. GCS motor subscore is 6.  Skin: Skin is warm and dry.  Psychiatric: She has a normal mood and affect. Her behavior is normal. Judgment and thought content normal.  Nursing note and vitals reviewed.   ED Course  Procedures (including critical care time) Labs Review Labs Reviewed  CBC - Abnormal; Notable for the following:    RBC 3.85 (*)    Hemoglobin 11.2 (*)    HCT 33.9 (*)    All other components within normal limits  BASIC METABOLIC PANEL - Abnormal; Notable for the  following:    Chloride 99 (*)    All other components within normal limits  BRAIN NATRIURETIC PEPTIDE  I-STAT TROPOININ, ED    Imaging Review No results found.   EKG Interpretation   Date/Time:  Saturday May 09 2015 18:39:26 EDT Ventricular Rate:  93 PR Interval:  201 QRS Duration: 148 QT Interval:  419 QTC Calculation: 521 R Axis:   -78 Text Interpretation:  Atrial-sensed ventricular-paced rhythm No further  analysis attempted due to paced rhythm Confirmed by Rubin Payor  MD, Harrold Donath  [phone]) on 05/09/2015 6:48:14 PM      MDM   Final diagnoses:  Neuropathic pain    Labs: I-STAT troponin, CBC, BMP, BNP- significant for hemoglobin 11.2  Imaging: None indicated  Consults: None  Therapeutics: Gabapentin  Assessment: Patient presents with pain to her upper and lower extremities. She describes it as cramping pains, but does not have actual muscle cramps. She is been seen numerous times in the emergency room with hospital admission with similar complaints with no significant findings on laboratory or physical exam. Uncertain as to the etiology of her pains. Family frustrated with inconclusive diagnosis, reporting that "we need to figure out what's wrong with her". I questioned her outpatient follow-up after her last ED visit, family notes that the primary care provider is "81 years old and doesn't care", so  they have not followed up with him. At today's evaluation patient has no new complaints, no significant findings on laboratory evaluation, her physical exam. She has no concerning signs or symptoms today. In-depth discussion of patient's options importance of primary care follow-up was enforced with the family. Family states that she's likely be back in a few days if Finds something wrong with her. I informed them that if new or worsening signs or symptoms presented she should return immediately to the emergency room, I'm uncertain of the utility of her return to the emergency room for the same complaint. I again expressed the importance of a primary care provider to manage her chronic conditions. I also encouraged them to talk to their primary care provider about long-term care options. After long in-depth discussion patient's family was at ease and agreed with my assessment and plan today I gave patient a dose of Neurontin here with a prescription to take home. Strict return precautions were given no further questions or concerns at time of discharge. Plan:      Eyvonne Mechanic, PA-C 05/10/15 4098  Benjiman Core, MD 05/11/15 [phone]

## 2015-05-09 NOTE — ED Notes (Signed)
Off the floor at this time.   

## 2015-05-10 DIAGNOSIS — M62838 Other muscle spasm: Secondary | ICD-10-CM | POA: Diagnosis not present

## 2015-05-10 NOTE — ED Notes (Signed)
Phlebotomy unable to collect blood. Ok to cancel per [place], [place].

## 2015-05-10 NOTE — ED Notes (Signed)
Pt is in stable condition upon d/c and is escorted home via [facility]. D/C instructions were given to family before their departure.

## 2015-05-10 NOTE — ED Notes (Signed)
Phlebotomy at bedside. [facility] at bedside as well awaiting to take pt home.

## 2015-05-11 DIAGNOSIS — I10 Essential (primary) hypertension: Secondary | ICD-10-CM | POA: Diagnosis not present

## 2015-05-11 DIAGNOSIS — I5033 Acute on chronic diastolic (congestive) heart failure: Secondary | ICD-10-CM | POA: Diagnosis not present

## 2015-05-11 DIAGNOSIS — I272 Other secondary pulmonary hypertension: Secondary | ICD-10-CM | POA: Diagnosis not present

## 2015-05-11 DIAGNOSIS — M150 Primary generalized (osteo)arthritis: Secondary | ICD-10-CM | POA: Diagnosis not present

## 2015-05-11 DIAGNOSIS — R252 Cramp and spasm: Secondary | ICD-10-CM | POA: Diagnosis not present

## 2015-05-11 DIAGNOSIS — R627 Adult failure to thrive: Secondary | ICD-10-CM | POA: Diagnosis not present

## 2015-05-14 DIAGNOSIS — R252 Cramp and spasm: Secondary | ICD-10-CM | POA: Diagnosis not present

## 2015-05-14 DIAGNOSIS — M150 Primary generalized (osteo)arthritis: Secondary | ICD-10-CM | POA: Diagnosis not present

## 2015-05-14 DIAGNOSIS — I5033 Acute on chronic diastolic (congestive) heart failure: Secondary | ICD-10-CM | POA: Diagnosis not present

## 2015-05-14 DIAGNOSIS — R627 Adult failure to thrive: Secondary | ICD-10-CM | POA: Diagnosis not present

## 2015-05-14 DIAGNOSIS — I10 Essential (primary) hypertension: Secondary | ICD-10-CM | POA: Diagnosis not present

## 2015-05-14 DIAGNOSIS — I272 Other secondary pulmonary hypertension: Secondary | ICD-10-CM | POA: Diagnosis not present

## 2015-05-15 DIAGNOSIS — I10 Essential (primary) hypertension: Secondary | ICD-10-CM | POA: Diagnosis not present

## 2015-05-15 DIAGNOSIS — R252 Cramp and spasm: Secondary | ICD-10-CM | POA: Diagnosis not present

## 2015-05-15 DIAGNOSIS — R627 Adult failure to thrive: Secondary | ICD-10-CM | POA: Diagnosis not present

## 2015-05-15 DIAGNOSIS — I5033 Acute on chronic diastolic (congestive) heart failure: Secondary | ICD-10-CM | POA: Diagnosis not present

## 2015-05-15 DIAGNOSIS — I272 Other secondary pulmonary hypertension: Secondary | ICD-10-CM | POA: Diagnosis not present

## 2015-05-15 DIAGNOSIS — M150 Primary generalized (osteo)arthritis: Secondary | ICD-10-CM | POA: Diagnosis not present

## 2015-05-19 DIAGNOSIS — I272 Other secondary pulmonary hypertension: Secondary | ICD-10-CM | POA: Diagnosis not present

## 2015-05-19 DIAGNOSIS — I10 Essential (primary) hypertension: Secondary | ICD-10-CM | POA: Diagnosis not present

## 2015-05-19 DIAGNOSIS — I5033 Acute on chronic diastolic (congestive) heart failure: Secondary | ICD-10-CM | POA: Diagnosis not present

## 2015-05-19 DIAGNOSIS — R252 Cramp and spasm: Secondary | ICD-10-CM | POA: Diagnosis not present

## 2015-05-19 DIAGNOSIS — R627 Adult failure to thrive: Secondary | ICD-10-CM | POA: Diagnosis not present

## 2015-05-19 DIAGNOSIS — M150 Primary generalized (osteo)arthritis: Secondary | ICD-10-CM | POA: Diagnosis not present

## 2015-05-21 DIAGNOSIS — I10 Essential (primary) hypertension: Secondary | ICD-10-CM | POA: Diagnosis not present

## 2015-05-21 DIAGNOSIS — I5033 Acute on chronic diastolic (congestive) heart failure: Secondary | ICD-10-CM | POA: Diagnosis not present

## 2015-05-21 DIAGNOSIS — M150 Primary generalized (osteo)arthritis: Secondary | ICD-10-CM | POA: Diagnosis not present

## 2015-05-21 DIAGNOSIS — R252 Cramp and spasm: Secondary | ICD-10-CM | POA: Diagnosis not present

## 2015-05-21 DIAGNOSIS — R627 Adult failure to thrive: Secondary | ICD-10-CM | POA: Diagnosis not present

## 2015-05-21 DIAGNOSIS — I272 Other secondary pulmonary hypertension: Secondary | ICD-10-CM | POA: Diagnosis not present

## 2015-05-22 DIAGNOSIS — I10 Essential (primary) hypertension: Secondary | ICD-10-CM | POA: Diagnosis not present

## 2015-05-22 DIAGNOSIS — R252 Cramp and spasm: Secondary | ICD-10-CM | POA: Diagnosis not present

## 2015-05-22 DIAGNOSIS — M150 Primary generalized (osteo)arthritis: Secondary | ICD-10-CM | POA: Diagnosis not present

## 2015-05-22 DIAGNOSIS — I272 Other secondary pulmonary hypertension: Secondary | ICD-10-CM | POA: Diagnosis not present

## 2015-05-22 DIAGNOSIS — I5033 Acute on chronic diastolic (congestive) heart failure: Secondary | ICD-10-CM | POA: Diagnosis not present

## 2015-05-22 DIAGNOSIS — R627 Adult failure to thrive: Secondary | ICD-10-CM | POA: Diagnosis not present

## 2015-05-26 DIAGNOSIS — I272 Other secondary pulmonary hypertension: Secondary | ICD-10-CM | POA: Diagnosis not present

## 2015-05-26 DIAGNOSIS — M150 Primary generalized (osteo)arthritis: Secondary | ICD-10-CM | POA: Diagnosis not present

## 2015-05-26 DIAGNOSIS — I10 Essential (primary) hypertension: Secondary | ICD-10-CM | POA: Diagnosis not present

## 2015-05-26 DIAGNOSIS — R252 Cramp and spasm: Secondary | ICD-10-CM | POA: Diagnosis not present

## 2015-05-26 DIAGNOSIS — R627 Adult failure to thrive: Secondary | ICD-10-CM | POA: Diagnosis not present

## 2015-05-26 DIAGNOSIS — I5033 Acute on chronic diastolic (congestive) heart failure: Secondary | ICD-10-CM | POA: Diagnosis not present

## 2015-05-28 DIAGNOSIS — R252 Cramp and spasm: Secondary | ICD-10-CM | POA: Diagnosis not present

## 2015-05-28 DIAGNOSIS — I272 Other secondary pulmonary hypertension: Secondary | ICD-10-CM | POA: Diagnosis not present

## 2015-05-28 DIAGNOSIS — I10 Essential (primary) hypertension: Secondary | ICD-10-CM | POA: Diagnosis not present

## 2015-05-28 DIAGNOSIS — M150 Primary generalized (osteo)arthritis: Secondary | ICD-10-CM | POA: Diagnosis not present

## 2015-05-28 DIAGNOSIS — I5033 Acute on chronic diastolic (congestive) heart failure: Secondary | ICD-10-CM | POA: Diagnosis not present

## 2015-05-28 DIAGNOSIS — R627 Adult failure to thrive: Secondary | ICD-10-CM | POA: Diagnosis not present

## 2015-06-01 DIAGNOSIS — I10 Essential (primary) hypertension: Secondary | ICD-10-CM | POA: Diagnosis not present

## 2015-06-01 DIAGNOSIS — I272 Other secondary pulmonary hypertension: Secondary | ICD-10-CM | POA: Diagnosis not present

## 2015-06-01 DIAGNOSIS — I5033 Acute on chronic diastolic (congestive) heart failure: Secondary | ICD-10-CM | POA: Diagnosis not present

## 2015-06-01 DIAGNOSIS — R252 Cramp and spasm: Secondary | ICD-10-CM | POA: Diagnosis not present

## 2015-06-01 DIAGNOSIS — M150 Primary generalized (osteo)arthritis: Secondary | ICD-10-CM | POA: Diagnosis not present

## 2015-06-01 DIAGNOSIS — R627 Adult failure to thrive: Secondary | ICD-10-CM | POA: Diagnosis not present

## 2015-06-02 DIAGNOSIS — R252 Cramp and spasm: Secondary | ICD-10-CM | POA: Diagnosis not present

## 2015-06-02 DIAGNOSIS — I272 Other secondary pulmonary hypertension: Secondary | ICD-10-CM | POA: Diagnosis not present

## 2015-06-02 DIAGNOSIS — M150 Primary generalized (osteo)arthritis: Secondary | ICD-10-CM | POA: Diagnosis not present

## 2015-06-02 DIAGNOSIS — R627 Adult failure to thrive: Secondary | ICD-10-CM | POA: Diagnosis not present

## 2015-06-02 DIAGNOSIS — I10 Essential (primary) hypertension: Secondary | ICD-10-CM | POA: Diagnosis not present

## 2015-06-02 DIAGNOSIS — I5033 Acute on chronic diastolic (congestive) heart failure: Secondary | ICD-10-CM | POA: Diagnosis not present

## 2015-06-03 ENCOUNTER — Telehealth: Payer: Self-pay | Admitting: Cardiology

## 2015-06-03 NOTE — Telephone Encounter (Signed)
New message      Another doctor want to give pt prednisone.  She is already on lasix and prednisone makes her go to the bathroom like the lasix.  Is it ok to take the prednisone?

## 2015-06-03 NOTE — Telephone Encounter (Signed)
[name], daughter of Ms. [name], calling.  Spoke w/Ms. [name] who gave permission to speak with her daughter.  Daughter states that her Mom was recently d/c from hospital for cramping in legs and pain all over. Dr. Renard Matter has prescribed Prednisone and Gabapentin.  Daughter states that they were concerned because she is on Lasix and if it is compatible with Prednisone. Spoke w/[name],NP who states that would be fine for her to take the Prednisone with the Lasix. Hope that this helps with her pain.  Advised daughter [name] that she could take the Prednisone.  Also advised would send Dr. Myrtis Ser and Larita Fife this message for them to be aware that she is having a lot of pain.  Daughter states that she can't even walk now.

## 2015-06-04 DIAGNOSIS — I272 Other secondary pulmonary hypertension: Secondary | ICD-10-CM | POA: Diagnosis not present

## 2015-06-04 DIAGNOSIS — M150 Primary generalized (osteo)arthritis: Secondary | ICD-10-CM | POA: Diagnosis not present

## 2015-06-04 DIAGNOSIS — R627 Adult failure to thrive: Secondary | ICD-10-CM | POA: Diagnosis not present

## 2015-06-04 DIAGNOSIS — I10 Essential (primary) hypertension: Secondary | ICD-10-CM | POA: Diagnosis not present

## 2015-06-04 DIAGNOSIS — R252 Cramp and spasm: Secondary | ICD-10-CM | POA: Diagnosis not present

## 2015-06-04 DIAGNOSIS — I5033 Acute on chronic diastolic (congestive) heart failure: Secondary | ICD-10-CM | POA: Diagnosis not present

## 2015-06-05 NOTE — Telephone Encounter (Signed)
Late Documentation 06/04/15 at 2:10 PM  I spoke with the pt who states that she is currently having leg cramps at this time. She states that she has not started taking Prednisone yet but plans to start taking today after her son picks up from pharmacy when he gets off work. She states that she was unable to work with PT this am due to pain and that she continues to have difficulties standing so she has not checked her weights in weeks. She states that she is not SOB and that her left ankle edema looks better than it normally does. The pt is advised to continue to follow her PCP's recommendations concerning her leg cramps. She verbalized understanding and agrees with plan.

## 2015-06-09 DIAGNOSIS — I5033 Acute on chronic diastolic (congestive) heart failure: Secondary | ICD-10-CM | POA: Diagnosis not present

## 2015-06-09 DIAGNOSIS — R627 Adult failure to thrive: Secondary | ICD-10-CM | POA: Diagnosis not present

## 2015-06-09 DIAGNOSIS — M150 Primary generalized (osteo)arthritis: Secondary | ICD-10-CM | POA: Diagnosis not present

## 2015-06-09 DIAGNOSIS — I272 Other secondary pulmonary hypertension: Secondary | ICD-10-CM | POA: Diagnosis not present

## 2015-06-09 DIAGNOSIS — I10 Essential (primary) hypertension: Secondary | ICD-10-CM | POA: Diagnosis not present

## 2015-06-09 DIAGNOSIS — R252 Cramp and spasm: Secondary | ICD-10-CM | POA: Diagnosis not present

## 2015-06-11 DIAGNOSIS — R627 Adult failure to thrive: Secondary | ICD-10-CM | POA: Diagnosis not present

## 2015-06-11 DIAGNOSIS — M150 Primary generalized (osteo)arthritis: Secondary | ICD-10-CM | POA: Diagnosis not present

## 2015-06-11 DIAGNOSIS — R252 Cramp and spasm: Secondary | ICD-10-CM | POA: Diagnosis not present

## 2015-06-11 DIAGNOSIS — I10 Essential (primary) hypertension: Secondary | ICD-10-CM | POA: Diagnosis not present

## 2015-06-11 DIAGNOSIS — I272 Other secondary pulmonary hypertension: Secondary | ICD-10-CM | POA: Diagnosis not present

## 2015-06-11 DIAGNOSIS — I5033 Acute on chronic diastolic (congestive) heart failure: Secondary | ICD-10-CM | POA: Diagnosis not present

## 2015-06-12 DIAGNOSIS — M150 Primary generalized (osteo)arthritis: Secondary | ICD-10-CM | POA: Diagnosis not present

## 2015-06-12 DIAGNOSIS — I272 Other secondary pulmonary hypertension: Secondary | ICD-10-CM | POA: Diagnosis not present

## 2015-06-12 DIAGNOSIS — I10 Essential (primary) hypertension: Secondary | ICD-10-CM | POA: Diagnosis not present

## 2015-06-12 DIAGNOSIS — I5033 Acute on chronic diastolic (congestive) heart failure: Secondary | ICD-10-CM | POA: Diagnosis not present

## 2015-06-12 DIAGNOSIS — R252 Cramp and spasm: Secondary | ICD-10-CM | POA: Diagnosis not present

## 2015-06-12 DIAGNOSIS — R627 Adult failure to thrive: Secondary | ICD-10-CM | POA: Diagnosis not present

## 2015-06-16 DIAGNOSIS — R252 Cramp and spasm: Secondary | ICD-10-CM | POA: Diagnosis not present

## 2015-06-16 DIAGNOSIS — I10 Essential (primary) hypertension: Secondary | ICD-10-CM | POA: Diagnosis not present

## 2015-06-16 DIAGNOSIS — I272 Other secondary pulmonary hypertension: Secondary | ICD-10-CM | POA: Diagnosis not present

## 2015-06-16 DIAGNOSIS — I5033 Acute on chronic diastolic (congestive) heart failure: Secondary | ICD-10-CM | POA: Diagnosis not present

## 2015-06-16 DIAGNOSIS — M150 Primary generalized (osteo)arthritis: Secondary | ICD-10-CM | POA: Diagnosis not present

## 2015-06-16 DIAGNOSIS — R627 Adult failure to thrive: Secondary | ICD-10-CM | POA: Diagnosis not present

## 2015-06-17 DIAGNOSIS — M150 Primary generalized (osteo)arthritis: Secondary | ICD-10-CM | POA: Diagnosis not present

## 2015-06-17 DIAGNOSIS — R252 Cramp and spasm: Secondary | ICD-10-CM | POA: Diagnosis not present

## 2015-06-17 DIAGNOSIS — I272 Other secondary pulmonary hypertension: Secondary | ICD-10-CM | POA: Diagnosis not present

## 2015-06-17 DIAGNOSIS — I10 Essential (primary) hypertension: Secondary | ICD-10-CM | POA: Diagnosis not present

## 2015-06-17 DIAGNOSIS — I5033 Acute on chronic diastolic (congestive) heart failure: Secondary | ICD-10-CM | POA: Diagnosis not present

## 2015-06-17 DIAGNOSIS — R627 Adult failure to thrive: Secondary | ICD-10-CM | POA: Diagnosis not present

## 2015-06-18 DIAGNOSIS — I5033 Acute on chronic diastolic (congestive) heart failure: Secondary | ICD-10-CM | POA: Diagnosis not present

## 2015-06-18 DIAGNOSIS — Z79899 Other long term (current) drug therapy: Secondary | ICD-10-CM | POA: Diagnosis not present

## 2015-06-18 DIAGNOSIS — M150 Primary generalized (osteo)arthritis: Secondary | ICD-10-CM | POA: Diagnosis not present

## 2015-06-18 DIAGNOSIS — M25569 Pain in unspecified knee: Secondary | ICD-10-CM | POA: Diagnosis not present

## 2015-06-18 DIAGNOSIS — E876 Hypokalemia: Secondary | ICD-10-CM | POA: Diagnosis not present

## 2015-06-19 DIAGNOSIS — R252 Cramp and spasm: Secondary | ICD-10-CM | POA: Diagnosis not present

## 2015-06-19 DIAGNOSIS — I10 Essential (primary) hypertension: Secondary | ICD-10-CM | POA: Diagnosis not present

## 2015-06-19 DIAGNOSIS — I5033 Acute on chronic diastolic (congestive) heart failure: Secondary | ICD-10-CM | POA: Diagnosis not present

## 2015-06-19 DIAGNOSIS — R627 Adult failure to thrive: Secondary | ICD-10-CM | POA: Diagnosis not present

## 2015-06-19 DIAGNOSIS — M150 Primary generalized (osteo)arthritis: Secondary | ICD-10-CM | POA: Diagnosis not present

## 2015-06-19 DIAGNOSIS — I272 Other secondary pulmonary hypertension: Secondary | ICD-10-CM | POA: Diagnosis not present

## 2015-06-22 DIAGNOSIS — I10 Essential (primary) hypertension: Secondary | ICD-10-CM | POA: Diagnosis not present

## 2015-06-22 DIAGNOSIS — M150 Primary generalized (osteo)arthritis: Secondary | ICD-10-CM | POA: Diagnosis not present

## 2015-06-22 DIAGNOSIS — I272 Other secondary pulmonary hypertension: Secondary | ICD-10-CM | POA: Diagnosis not present

## 2015-06-22 DIAGNOSIS — R252 Cramp and spasm: Secondary | ICD-10-CM | POA: Diagnosis not present

## 2015-06-22 DIAGNOSIS — I5033 Acute on chronic diastolic (congestive) heart failure: Secondary | ICD-10-CM | POA: Diagnosis not present

## 2015-06-22 DIAGNOSIS — R627 Adult failure to thrive: Secondary | ICD-10-CM | POA: Diagnosis not present

## 2015-06-23 DIAGNOSIS — I5033 Acute on chronic diastolic (congestive) heart failure: Secondary | ICD-10-CM | POA: Diagnosis not present

## 2015-06-23 DIAGNOSIS — M150 Primary generalized (osteo)arthritis: Secondary | ICD-10-CM | POA: Diagnosis not present

## 2015-06-23 DIAGNOSIS — I10 Essential (primary) hypertension: Secondary | ICD-10-CM | POA: Diagnosis not present

## 2015-06-23 DIAGNOSIS — R252 Cramp and spasm: Secondary | ICD-10-CM | POA: Diagnosis not present

## 2015-06-23 DIAGNOSIS — R627 Adult failure to thrive: Secondary | ICD-10-CM | POA: Diagnosis not present

## 2015-06-23 DIAGNOSIS — I272 Other secondary pulmonary hypertension: Secondary | ICD-10-CM | POA: Diagnosis not present

## 2015-06-24 DIAGNOSIS — R252 Cramp and spasm: Secondary | ICD-10-CM | POA: Diagnosis not present

## 2015-06-24 DIAGNOSIS — I10 Essential (primary) hypertension: Secondary | ICD-10-CM | POA: Diagnosis not present

## 2015-06-24 DIAGNOSIS — I5033 Acute on chronic diastolic (congestive) heart failure: Secondary | ICD-10-CM | POA: Diagnosis not present

## 2015-06-24 DIAGNOSIS — I272 Other secondary pulmonary hypertension: Secondary | ICD-10-CM | POA: Diagnosis not present

## 2015-06-24 DIAGNOSIS — M150 Primary generalized (osteo)arthritis: Secondary | ICD-10-CM | POA: Diagnosis not present

## 2015-06-24 DIAGNOSIS — R627 Adult failure to thrive: Secondary | ICD-10-CM | POA: Diagnosis not present

## 2015-06-26 DIAGNOSIS — R252 Cramp and spasm: Secondary | ICD-10-CM | POA: Diagnosis not present

## 2015-06-26 DIAGNOSIS — I5033 Acute on chronic diastolic (congestive) heart failure: Secondary | ICD-10-CM | POA: Diagnosis not present

## 2015-06-26 DIAGNOSIS — I272 Other secondary pulmonary hypertension: Secondary | ICD-10-CM | POA: Diagnosis not present

## 2015-06-26 DIAGNOSIS — I10 Essential (primary) hypertension: Secondary | ICD-10-CM | POA: Diagnosis not present

## 2015-06-26 DIAGNOSIS — R627 Adult failure to thrive: Secondary | ICD-10-CM | POA: Diagnosis not present

## 2015-06-26 DIAGNOSIS — M150 Primary generalized (osteo)arthritis: Secondary | ICD-10-CM | POA: Diagnosis not present

## 2015-06-29 DIAGNOSIS — I272 Other secondary pulmonary hypertension: Secondary | ICD-10-CM | POA: Diagnosis not present

## 2015-06-29 DIAGNOSIS — M150 Primary generalized (osteo)arthritis: Secondary | ICD-10-CM | POA: Diagnosis not present

## 2015-06-29 DIAGNOSIS — I5033 Acute on chronic diastolic (congestive) heart failure: Secondary | ICD-10-CM | POA: Diagnosis not present

## 2015-06-29 DIAGNOSIS — R252 Cramp and spasm: Secondary | ICD-10-CM | POA: Diagnosis not present

## 2015-06-29 DIAGNOSIS — R627 Adult failure to thrive: Secondary | ICD-10-CM | POA: Diagnosis not present

## 2015-06-29 DIAGNOSIS — I10 Essential (primary) hypertension: Secondary | ICD-10-CM | POA: Diagnosis not present

## 2015-06-30 DIAGNOSIS — I5033 Acute on chronic diastolic (congestive) heart failure: Secondary | ICD-10-CM | POA: Diagnosis not present

## 2015-06-30 DIAGNOSIS — M150 Primary generalized (osteo)arthritis: Secondary | ICD-10-CM | POA: Diagnosis not present

## 2015-06-30 DIAGNOSIS — R627 Adult failure to thrive: Secondary | ICD-10-CM | POA: Diagnosis not present

## 2015-06-30 DIAGNOSIS — I272 Other secondary pulmonary hypertension: Secondary | ICD-10-CM | POA: Diagnosis not present

## 2015-06-30 DIAGNOSIS — R252 Cramp and spasm: Secondary | ICD-10-CM | POA: Diagnosis not present

## 2015-06-30 DIAGNOSIS — I10 Essential (primary) hypertension: Secondary | ICD-10-CM | POA: Diagnosis not present

## 2015-07-01 DIAGNOSIS — M150 Primary generalized (osteo)arthritis: Secondary | ICD-10-CM | POA: Diagnosis not present

## 2015-07-01 DIAGNOSIS — I10 Essential (primary) hypertension: Secondary | ICD-10-CM | POA: Diagnosis not present

## 2015-07-01 DIAGNOSIS — R252 Cramp and spasm: Secondary | ICD-10-CM | POA: Diagnosis not present

## 2015-07-01 DIAGNOSIS — R627 Adult failure to thrive: Secondary | ICD-10-CM | POA: Diagnosis not present

## 2015-07-01 DIAGNOSIS — I272 Other secondary pulmonary hypertension: Secondary | ICD-10-CM | POA: Diagnosis not present

## 2015-07-01 DIAGNOSIS — I5033 Acute on chronic diastolic (congestive) heart failure: Secondary | ICD-10-CM | POA: Diagnosis not present

## 2015-07-02 DIAGNOSIS — I5033 Acute on chronic diastolic (congestive) heart failure: Secondary | ICD-10-CM | POA: Diagnosis not present

## 2015-07-02 DIAGNOSIS — Z96641 Presence of right artificial hip joint: Secondary | ICD-10-CM | POA: Diagnosis not present

## 2015-07-02 DIAGNOSIS — M150 Primary generalized (osteo)arthritis: Secondary | ICD-10-CM | POA: Diagnosis not present

## 2015-07-02 DIAGNOSIS — R252 Cramp and spasm: Secondary | ICD-10-CM | POA: Diagnosis not present

## 2015-07-02 DIAGNOSIS — R627 Adult failure to thrive: Secondary | ICD-10-CM | POA: Diagnosis not present

## 2015-07-02 DIAGNOSIS — I272 Other secondary pulmonary hypertension: Secondary | ICD-10-CM | POA: Diagnosis not present

## 2015-07-02 DIAGNOSIS — I10 Essential (primary) hypertension: Secondary | ICD-10-CM | POA: Diagnosis not present

## 2015-07-02 DIAGNOSIS — Z950 Presence of cardiac pacemaker: Secondary | ICD-10-CM | POA: Diagnosis not present

## 2015-07-06 DIAGNOSIS — I272 Other secondary pulmonary hypertension: Secondary | ICD-10-CM | POA: Diagnosis not present

## 2015-07-06 DIAGNOSIS — I10 Essential (primary) hypertension: Secondary | ICD-10-CM | POA: Diagnosis not present

## 2015-07-06 DIAGNOSIS — M150 Primary generalized (osteo)arthritis: Secondary | ICD-10-CM | POA: Diagnosis not present

## 2015-07-06 DIAGNOSIS — I5033 Acute on chronic diastolic (congestive) heart failure: Secondary | ICD-10-CM | POA: Diagnosis not present

## 2015-07-06 DIAGNOSIS — R627 Adult failure to thrive: Secondary | ICD-10-CM | POA: Diagnosis not present

## 2015-07-06 DIAGNOSIS — R252 Cramp and spasm: Secondary | ICD-10-CM | POA: Diagnosis not present

## 2015-07-14 DIAGNOSIS — I272 Other secondary pulmonary hypertension: Secondary | ICD-10-CM | POA: Diagnosis not present

## 2015-07-14 DIAGNOSIS — M150 Primary generalized (osteo)arthritis: Secondary | ICD-10-CM | POA: Diagnosis not present

## 2015-07-14 DIAGNOSIS — I10 Essential (primary) hypertension: Secondary | ICD-10-CM | POA: Diagnosis not present

## 2015-07-14 DIAGNOSIS — R252 Cramp and spasm: Secondary | ICD-10-CM | POA: Diagnosis not present

## 2015-07-14 DIAGNOSIS — I5033 Acute on chronic diastolic (congestive) heart failure: Secondary | ICD-10-CM | POA: Diagnosis not present

## 2015-07-14 DIAGNOSIS — R627 Adult failure to thrive: Secondary | ICD-10-CM | POA: Diagnosis not present

## 2015-07-17 ENCOUNTER — Telehealth: Payer: Self-pay | Admitting: Cardiology

## 2015-07-17 NOTE — Telephone Encounter (Signed)
New message      Ms Furio's daughter request Larita Fife call her.  She would not tell me what she wanted

## 2015-07-17 NOTE — Telephone Encounter (Signed)
[name], the pts daughter, wants to know who the pt will see after Dr Samuel Bouche at the end of September. She is advised that at the pts next f/u is on 9/12 and that Dr Myrtis Ser will discuss with them at that time. She verbalized understanding.

## 2015-07-21 DIAGNOSIS — M150 Primary generalized (osteo)arthritis: Secondary | ICD-10-CM | POA: Diagnosis not present

## 2015-07-21 DIAGNOSIS — I5033 Acute on chronic diastolic (congestive) heart failure: Secondary | ICD-10-CM | POA: Diagnosis not present

## 2015-07-21 DIAGNOSIS — I10 Essential (primary) hypertension: Secondary | ICD-10-CM | POA: Diagnosis not present

## 2015-07-21 DIAGNOSIS — R627 Adult failure to thrive: Secondary | ICD-10-CM | POA: Diagnosis not present

## 2015-07-22 DIAGNOSIS — I10 Essential (primary) hypertension: Secondary | ICD-10-CM | POA: Diagnosis not present

## 2015-07-22 DIAGNOSIS — R627 Adult failure to thrive: Secondary | ICD-10-CM | POA: Diagnosis not present

## 2015-07-22 DIAGNOSIS — I272 Other secondary pulmonary hypertension: Secondary | ICD-10-CM | POA: Diagnosis not present

## 2015-07-22 DIAGNOSIS — M150 Primary generalized (osteo)arthritis: Secondary | ICD-10-CM | POA: Diagnosis not present

## 2015-07-22 DIAGNOSIS — R252 Cramp and spasm: Secondary | ICD-10-CM | POA: Diagnosis not present

## 2015-07-22 DIAGNOSIS — I5033 Acute on chronic diastolic (congestive) heart failure: Secondary | ICD-10-CM | POA: Diagnosis not present

## 2015-07-23 DIAGNOSIS — R627 Adult failure to thrive: Secondary | ICD-10-CM | POA: Diagnosis not present

## 2015-07-23 DIAGNOSIS — I10 Essential (primary) hypertension: Secondary | ICD-10-CM | POA: Diagnosis not present

## 2015-07-23 DIAGNOSIS — R252 Cramp and spasm: Secondary | ICD-10-CM | POA: Diagnosis not present

## 2015-07-23 DIAGNOSIS — I272 Other secondary pulmonary hypertension: Secondary | ICD-10-CM | POA: Diagnosis not present

## 2015-07-23 DIAGNOSIS — I5033 Acute on chronic diastolic (congestive) heart failure: Secondary | ICD-10-CM | POA: Diagnosis not present

## 2015-07-23 DIAGNOSIS — M150 Primary generalized (osteo)arthritis: Secondary | ICD-10-CM | POA: Diagnosis not present

## 2015-07-27 DIAGNOSIS — R627 Adult failure to thrive: Secondary | ICD-10-CM | POA: Diagnosis not present

## 2015-07-27 DIAGNOSIS — I272 Other secondary pulmonary hypertension: Secondary | ICD-10-CM | POA: Diagnosis not present

## 2015-07-27 DIAGNOSIS — I5033 Acute on chronic diastolic (congestive) heart failure: Secondary | ICD-10-CM | POA: Diagnosis not present

## 2015-07-27 DIAGNOSIS — I10 Essential (primary) hypertension: Secondary | ICD-10-CM | POA: Diagnosis not present

## 2015-07-27 DIAGNOSIS — M150 Primary generalized (osteo)arthritis: Secondary | ICD-10-CM | POA: Diagnosis not present

## 2015-07-27 DIAGNOSIS — R252 Cramp and spasm: Secondary | ICD-10-CM | POA: Diagnosis not present

## 2015-07-28 DIAGNOSIS — R252 Cramp and spasm: Secondary | ICD-10-CM | POA: Diagnosis not present

## 2015-07-28 DIAGNOSIS — I5033 Acute on chronic diastolic (congestive) heart failure: Secondary | ICD-10-CM | POA: Diagnosis not present

## 2015-07-28 DIAGNOSIS — R627 Adult failure to thrive: Secondary | ICD-10-CM | POA: Diagnosis not present

## 2015-07-28 DIAGNOSIS — I10 Essential (primary) hypertension: Secondary | ICD-10-CM | POA: Diagnosis not present

## 2015-07-28 DIAGNOSIS — T1490 Injury, unspecified: Secondary | ICD-10-CM | POA: Diagnosis not present

## 2015-07-28 DIAGNOSIS — M150 Primary generalized (osteo)arthritis: Secondary | ICD-10-CM | POA: Diagnosis not present

## 2015-07-28 DIAGNOSIS — I272 Other secondary pulmonary hypertension: Secondary | ICD-10-CM | POA: Diagnosis not present

## 2015-07-29 ENCOUNTER — Encounter: Payer: Self-pay | Admitting: *Deleted

## 2015-07-29 DIAGNOSIS — R627 Adult failure to thrive: Secondary | ICD-10-CM | POA: Diagnosis not present

## 2015-07-29 DIAGNOSIS — I5033 Acute on chronic diastolic (congestive) heart failure: Secondary | ICD-10-CM | POA: Diagnosis not present

## 2015-07-29 DIAGNOSIS — I272 Other secondary pulmonary hypertension: Secondary | ICD-10-CM | POA: Diagnosis not present

## 2015-07-29 DIAGNOSIS — M150 Primary generalized (osteo)arthritis: Secondary | ICD-10-CM | POA: Diagnosis not present

## 2015-07-29 DIAGNOSIS — I10 Essential (primary) hypertension: Secondary | ICD-10-CM | POA: Diagnosis not present

## 2015-07-29 DIAGNOSIS — R252 Cramp and spasm: Secondary | ICD-10-CM | POA: Diagnosis not present

## 2015-08-03 ENCOUNTER — Encounter: Payer: Self-pay | Admitting: Cardiology

## 2015-08-03 ENCOUNTER — Ambulatory Visit (INDEPENDENT_AMBULATORY_CARE_PROVIDER_SITE_OTHER): Payer: Medicare Other | Admitting: Cardiology

## 2015-08-03 VITALS — BP 154/78 | HR 80

## 2015-08-03 DIAGNOSIS — R531 Weakness: Secondary | ICD-10-CM

## 2015-08-03 DIAGNOSIS — Z950 Presence of cardiac pacemaker: Secondary | ICD-10-CM

## 2015-08-03 DIAGNOSIS — I5032 Chronic diastolic (congestive) heart failure: Secondary | ICD-10-CM | POA: Diagnosis not present

## 2015-08-03 NOTE — Assessment & Plan Note (Signed)
Her pacemaker continues to function properly.

## 2015-08-03 NOTE — Assessment & Plan Note (Signed)
She has chronic diastolic CHF. My nurse, Larita Fife Via calls the patient at home every day that she is in the office. She helps to adjust her medications. We can no longer follow weights because the patient is not able to weigh herself.

## 2015-08-03 NOTE — Assessment & Plan Note (Signed)
Unfortunately the patient is not able to ambulate. Her social situation is unfortunate. She insists that she is not willing to go to rehabilitation. One of her sons is here today and he wants her to go to rehabilitation. I do not have all of the information so it is difficult to know how to try to influence her. It does seem that moving to place with more support would be better for her.

## 2015-08-03 NOTE — Patient Instructions (Signed)
Medication Instructions:  No changes today  Labwork: None today  Testing/Procedures: None today  Follow-Up: Lynn/Dr Myrtis Ser will call you about follow up.

## 2015-08-03 NOTE — Progress Notes (Signed)
Cardiology Office Note   Date:  08/03/2015   ID:  [name], DOB [birth year], MRN [id number]  PCP:  Alice Reichert, MD  Cardiologist:  Willa Rough, MD   Chief Complaint  Patient presents with  . Appointment    Follow-up diastolic CHF      History of Present Illness: [name] is a 81 y.o. female who presents today to follow-up CHF. I saw her last in the office in May, 2016. Since that time she is been in the emergency room with leg cramps. At the current time she lives alone in a home. She cannot ambulate. She is here with one of her children. They have been encouraging her to go into a nursing facility. Up to this point she has refused. I do not have all of the information.  Past Medical History  Diagnosis Date  . Edema     . EF 65-70%, echo, November 29, 2010  . Pulmonary hypertension      46 mmHg... echo... January, 2012  . Cellulitis     Superficial cellulitis right lower leg   January 14, 2011  . AV block      2:1  January 14, 2011 / pacemaker plan when cellulitis resolved in length  . Bradycardia     2:1  AV block  . Hypokalemia     October, 2012  . Ejection fraction     EF 65%, echo, January, 2012  . Murmur     No significant valvular disease by echo, January, 2012  . Pacemaker     January, 2013  . Chronic diastolic CHF (congestive heart failure)   . Hypertension   . Arthritis     "qwhere"    Past Surgical History  Procedure Laterality Date  . Hip arthroplasty Right   . Partial hysterectomy      vaginal  . Cholecystectomy    . Insert / replace / remove pacemaker  11/2011  . Permanent pacemaker insertion N/A 12/09/2011    Procedure: PERMANENT PACEMAKER INSERTION;  Surgeon: Marinus Maw, MD;  Location: [facility] CATH LAB;  Service: Cardiovascular;  Laterality: N/A;    Patient Active Problem List   Diagnosis Date Noted  . Chronic edema 04/30/2015  . Elevated troponin 04/30/2015  . Peripheral edema 04/30/2015  . Acute on chronic diastolic  heart failure 04/30/2015  . Nocturnal leg cramps 04/06/2015  . Pressure in chest 01/13/2015  . Generalized weakness 08/06/2014  . Protein-calorie malnutrition, moderate 08/06/2014  . Osteoarthritis 04/29/2014  . Anxiety 04/28/2014  . Neck pain 04/28/2014  . Weakness 04/21/2014  . FTT (failure to thrive) in adult 04/21/2014  . Chronic diastolic CHF (congestive heart failure)   . Pacemaker   . Hypertension 12/10/2011  . Cardiac pacemaker 12/10/2011  . Murmur   . Hypokalemia   . Ejection fraction   . Edema   . AV block   . Cellulitis   . Pulmonary hypertension       Current Outpatient Prescriptions  Medication Sig Dispense Refill  . Acetaminophen-Codeine 300-30 MG per tablet Take 1 tablet by mouth 2 (two) times daily as needed for pain. For leg pain (Patient taking differently: Take 1 tablet by mouth 2 (two) times daily as needed for pain. ) 45 tablet 5  . carvedilol (COREG) 3.125 MG tablet Take 3.125 mg by mouth 2 (two) times daily with a meal.    . cholecalciferol (VITAMIN D) 1000 UNITS tablet Take 1,000 Units by mouth daily.    [name]  cyclobenzaprine (FLEXERIL) 5 MG tablet Take 5 mg by mouth 2 (two) times daily as needed for muscle spasms.     . furosemide (LASIX) 20 MG tablet Take 3 tablets (60 mg total) by mouth 2 (two) times daily. 180 tablet 5  . gabapentin (NEURONTIN) 300 MG capsule Take 1 capsule (300 mg total) by mouth 3 (three) times daily. 60 capsule 0  . magnesium oxide (MAG-OX) 400 (241.3 MG) MG tablet TAKE ONE TABLET BY MOUTH ONE TIME DAILY 30 tablet 4  . metolazone (ZAROXOLYN) 2.5 MG tablet Take 1 tab daily as needed for daily weight of 172 lbs or greater (Patient taking differently: Take 2.5 mg by mouth daily as needed (for weight of 172 lbs or more). ) 30 tablet 3  . Multiple Vitamin (MULTIVITAMIN WITH MINERALS) TABS tablet Take 1 tablet by mouth daily.    . nitroGLYCERIN (NITROSTAT) 0.4 MG SL tablet Place 0.4 mg under the tongue every 5 (five) minutes as needed for chest  pain.    . potassium chloride SA (K-DUR,KLOR-CON) 20 MEQ tablet TAKE 3 TABLETS (60 MEQ TOTAL) BY MOUTH DAILY. 90 tablet 5   No current facility-administered medications for this visit.    Allergies:   Aspirin and Tramadol    Social History:  The patient  reports that she has never smoked. She has never used smokeless tobacco. She reports that she does not drink alcohol or use illicit drugs.   Family History:  The patient's family history includes Cancer in her son; Cardiomyopathy in her father; Coronary artery disease in her son; Dementia in her mother; Diabetes in her father; Heart failure in her father.    ROS:  Please see the history of present illness.   Patient denies fever, chills, headache, sweats, rash, change in vision, change in hearing, chest pain, cough, nausea or vomiting, urinary symptoms. All other systems are reviewed and are negative.    PHYSICAL EXAM: VS:  BP 154/78 mmHg  Pulse 80 , Patient is oriented to person time and place. Affect is normal. Head is atraumatic. Sclera and conjunctiva are normal. She is overweight. She is in a wheelchair with a strap helping to hold her in. Lungs reveal scattered rhonchi. Cardiac exam reveals an S1 and S2. The abdomen is obese. She has lower extremity skin changes. Her edema is actually less than I have seen in the past.   EKG:   EKG is not done today.   Recent Labs: 08/07/2014: TSH 2.310 04/30/2015: ALT 10* 05/01/2015: Magnesium 1.7 05/09/2015: B Natriuretic Peptide 83.7; BUN 17; Creatinine, Ser 0.87; Hemoglobin 11.2*; Platelets 358; Potassium 4.4; Sodium 135    Lipid Panel No results found for: CHOL, TRIG, HDL, CHOLHDL, VLDL, LDLCALC, LDLDIRECT    Wt Readings from Last 3 Encounters:  05/09/15 168 lb 14 oz (76.6 kg)  05/01/15 168 lb 14.4 oz (76.613 kg)  04/06/15 171 lb 3.2 oz (77.656 kg)      Current medicines are reviewed  The patient understands her medications.     ASSESSMENT AND PLAN:

## 2015-08-04 DIAGNOSIS — R252 Cramp and spasm: Secondary | ICD-10-CM | POA: Diagnosis not present

## 2015-08-04 DIAGNOSIS — I10 Essential (primary) hypertension: Secondary | ICD-10-CM | POA: Diagnosis not present

## 2015-08-04 DIAGNOSIS — R627 Adult failure to thrive: Secondary | ICD-10-CM | POA: Diagnosis not present

## 2015-08-04 DIAGNOSIS — M150 Primary generalized (osteo)arthritis: Secondary | ICD-10-CM | POA: Diagnosis not present

## 2015-08-04 DIAGNOSIS — I5033 Acute on chronic diastolic (congestive) heart failure: Secondary | ICD-10-CM | POA: Diagnosis not present

## 2015-08-04 DIAGNOSIS — I272 Other secondary pulmonary hypertension: Secondary | ICD-10-CM | POA: Diagnosis not present

## 2015-08-05 ENCOUNTER — Telehealth: Payer: Self-pay | Admitting: Cardiology

## 2015-08-05 DIAGNOSIS — I272 Other secondary pulmonary hypertension: Secondary | ICD-10-CM | POA: Diagnosis not present

## 2015-08-05 DIAGNOSIS — M150 Primary generalized (osteo)arthritis: Secondary | ICD-10-CM | POA: Diagnosis not present

## 2015-08-05 DIAGNOSIS — I10 Essential (primary) hypertension: Secondary | ICD-10-CM | POA: Diagnosis not present

## 2015-08-05 DIAGNOSIS — R627 Adult failure to thrive: Secondary | ICD-10-CM | POA: Diagnosis not present

## 2015-08-05 DIAGNOSIS — I5033 Acute on chronic diastolic (congestive) heart failure: Secondary | ICD-10-CM | POA: Diagnosis not present

## 2015-08-05 DIAGNOSIS — R252 Cramp and spasm: Secondary | ICD-10-CM | POA: Diagnosis not present

## 2015-08-05 NOTE — Telephone Encounter (Signed)
New message     Pt daughter calling about getting pt into a rehab facility Please call to discuss

## 2015-08-05 NOTE — Telephone Encounter (Signed)
[name], the pts son, is advised that I will be speaking to Dr Myrtis Ser later today concerning the pt. And that I will call his sister [place] with Dr Myrtis Ser recommendations. He verbalized understanding and is in agreement.

## 2015-08-05 NOTE — Telephone Encounter (Signed)
Follow up     Calling to see if you had had a chance to talk to Dr Myrtis Ser about a rehab facility?

## 2015-08-05 NOTE — Telephone Encounter (Signed)
**Note De-Identified [name] Obfuscation** Per Dr Myrtis Ser the pts daughter,[name], is advised that the pt has to agree to go to a rehabilitation center and that her PCP will need to handle that. [name] verbalized understanding and expressed disappointment as she states that the pt will not agree to going to a rehab facility and that it is really a hardship for her and other family members to care for her. I recommended that she contact the pts PCP but [name] states they have already tried that but the pt was unwilling to go to rehab even at the suggestion of her PCP. [name] is advised that I will call the pt and let her know that (per Dr Myrtis Ser) I will not be calling her for her daily weights until she can stand on the scales and that I will only be calling about once every 1 to 2 weeks to check to see if she can stand on the scales. [name] is in agreement with me calling the pt with this message in hopes that it will motivate her to go to rehab.  I called the pt and explained to her that I will not be calling to get her daily weights until she can stand on scales. Also, the pt is advised that her new cardiologist will be Dr Eldridge Dace and that I am going to be his nurse when Dr Samuel Bouche on 08/21/15. She verbalized understanding and seemed pleased that I will continue to be her cardiologist's nurse.

## 2015-08-05 NOTE — Telephone Encounter (Addendum)
The pts daughter wants to know if Dr Myrtis Ser is placing the pt in a rehab facility due to her inability to walk. [name] is advised that I will be talking with Dr Myrtis Ser later today and will discuss Ms. Smyre with him and call her back with his recommendation. She verbalized understanding.

## 2015-08-07 DIAGNOSIS — R627 Adult failure to thrive: Secondary | ICD-10-CM | POA: Diagnosis not present

## 2015-08-07 DIAGNOSIS — I272 Other secondary pulmonary hypertension: Secondary | ICD-10-CM | POA: Diagnosis not present

## 2015-08-07 DIAGNOSIS — I10 Essential (primary) hypertension: Secondary | ICD-10-CM | POA: Diagnosis not present

## 2015-08-07 DIAGNOSIS — R252 Cramp and spasm: Secondary | ICD-10-CM | POA: Diagnosis not present

## 2015-08-07 DIAGNOSIS — M150 Primary generalized (osteo)arthritis: Secondary | ICD-10-CM | POA: Diagnosis not present

## 2015-08-07 DIAGNOSIS — I5033 Acute on chronic diastolic (congestive) heart failure: Secondary | ICD-10-CM | POA: Diagnosis not present

## 2015-08-10 ENCOUNTER — Encounter (HOSPITAL_COMMUNITY): Payer: Self-pay | Admitting: *Deleted

## 2015-08-10 DIAGNOSIS — R69 Illness, unspecified: Secondary | ICD-10-CM | POA: Diagnosis not present

## 2015-08-11 DIAGNOSIS — R252 Cramp and spasm: Secondary | ICD-10-CM | POA: Diagnosis not present

## 2015-08-11 DIAGNOSIS — R627 Adult failure to thrive: Secondary | ICD-10-CM | POA: Diagnosis not present

## 2015-08-11 DIAGNOSIS — M150 Primary generalized (osteo)arthritis: Secondary | ICD-10-CM | POA: Diagnosis not present

## 2015-08-11 DIAGNOSIS — I272 Other secondary pulmonary hypertension: Secondary | ICD-10-CM | POA: Diagnosis not present

## 2015-08-11 DIAGNOSIS — I5033 Acute on chronic diastolic (congestive) heart failure: Secondary | ICD-10-CM | POA: Diagnosis not present

## 2015-08-11 DIAGNOSIS — I10 Essential (primary) hypertension: Secondary | ICD-10-CM | POA: Diagnosis not present

## 2015-08-12 ENCOUNTER — Emergency Department (HOSPITAL_COMMUNITY): Payer: Medicare Other

## 2015-08-12 ENCOUNTER — Encounter (HOSPITAL_COMMUNITY): Payer: Self-pay

## 2015-08-12 ENCOUNTER — Observation Stay (HOSPITAL_COMMUNITY)
Admission: EM | Admit: 2015-08-12 | Discharge: 2015-08-14 | Disposition: A | Discharge status: Home / Self Care | Payer: Medicare Other | Attending: Family Medicine | Admitting: Family Medicine

## 2015-08-12 DIAGNOSIS — E876 Hypokalemia: Secondary | ICD-10-CM | POA: Diagnosis not present

## 2015-08-12 DIAGNOSIS — Z872 Personal history of diseases of the skin and subcutaneous tissue: Secondary | ICD-10-CM | POA: Diagnosis not present

## 2015-08-12 DIAGNOSIS — R0609 Other forms of dyspnea: Secondary | ICD-10-CM

## 2015-08-12 DIAGNOSIS — I5032 Chronic diastolic (congestive) heart failure: Secondary | ICD-10-CM | POA: Insufficient documentation

## 2015-08-12 DIAGNOSIS — I10 Essential (primary) hypertension: Secondary | ICD-10-CM

## 2015-08-12 DIAGNOSIS — I272 Other secondary pulmonary hypertension: Secondary | ICD-10-CM | POA: Diagnosis not present

## 2015-08-12 DIAGNOSIS — Z79899 Other long term (current) drug therapy: Secondary | ICD-10-CM | POA: Insufficient documentation

## 2015-08-12 DIAGNOSIS — Z950 Presence of cardiac pacemaker: Secondary | ICD-10-CM

## 2015-08-12 DIAGNOSIS — M1990 Unspecified osteoarthritis, unspecified site: Secondary | ICD-10-CM | POA: Diagnosis not present

## 2015-08-12 DIAGNOSIS — M150 Primary generalized (osteo)arthritis: Secondary | ICD-10-CM | POA: Diagnosis not present

## 2015-08-12 DIAGNOSIS — Z9189 Other specified personal risk factors, not elsewhere classified: Secondary | ICD-10-CM

## 2015-08-12 DIAGNOSIS — I5033 Acute on chronic diastolic (congestive) heart failure: Secondary | ICD-10-CM | POA: Diagnosis not present

## 2015-08-12 DIAGNOSIS — M79606 Pain in leg, unspecified: Secondary | ICD-10-CM

## 2015-08-12 DIAGNOSIS — R252 Cramp and spasm: Secondary | ICD-10-CM | POA: Diagnosis not present

## 2015-08-12 DIAGNOSIS — R0602 Shortness of breath: Secondary | ICD-10-CM | POA: Diagnosis not present

## 2015-08-12 DIAGNOSIS — R079 Chest pain, unspecified: Principal | ICD-10-CM | POA: Diagnosis present

## 2015-08-12 DIAGNOSIS — I4430 Unspecified atrioventricular block: Secondary | ICD-10-CM | POA: Diagnosis not present

## 2015-08-12 DIAGNOSIS — R627 Adult failure to thrive: Secondary | ICD-10-CM | POA: Diagnosis not present

## 2015-08-12 DIAGNOSIS — R072 Precordial pain: Secondary | ICD-10-CM | POA: Diagnosis not present

## 2015-08-12 DIAGNOSIS — R011 Cardiac murmur, unspecified: Secondary | ICD-10-CM | POA: Insufficient documentation

## 2015-08-12 LAB — CBC WITH DIFFERENTIAL/PLATELET
Basophils Absolute: 0 10*3/uL (ref 0.0–0.1)
Basophils Relative: 0 %
Eosinophils Absolute: 0.1 10*3/uL (ref 0.0–0.7)
Eosinophils Relative: 1 %
HEMATOCRIT: 42.4 % (ref 36.0–46.0)
HEMOGLOBIN: 14.2 g/dL (ref 12.0–15.0)
LYMPHS ABS: 2.3 10*3/uL (ref 0.7–4.0)
LYMPHS PCT: 29 %
MCH: 30.1 pg (ref 26.0–34.0)
MCHC: 33.5 g/dL (ref 30.0–36.0)
MCV: 89.8 fL (ref 78.0–100.0)
Monocytes Absolute: 0.8 10*3/uL (ref 0.1–1.0)
Monocytes Relative: 11 %
NEUTROS ABS: 4.6 10*3/uL (ref 1.7–7.7)
Neutrophils Relative %: 59 %
Platelets: 246 10*3/uL (ref 150–400)
RBC: 4.72 MIL/uL (ref 3.87–5.11)
RDW: 16.5 % — ABNORMAL HIGH (ref 11.5–15.5)
WBC: 7.8 10*3/uL (ref 4.0–10.5)

## 2015-08-12 LAB — BRAIN NATRIURETIC PEPTIDE: B Natriuretic Peptide: 89 pg/mL (ref 0.0–100.0)

## 2015-08-12 LAB — COMPREHENSIVE METABOLIC PANEL
ALT: 14 U/L (ref 14–54)
AST: 28 U/L (ref 15–41)
Albumin: 3.5 g/dL (ref 3.5–5.0)
Alkaline Phosphatase: 97 U/L (ref 38–126)
Anion gap: 8 (ref 5–15)
BUN: 15 mg/dL (ref 6–20)
CHLORIDE: 99 mmol/L — AB (ref 101–111)
CO2: 32 mmol/L (ref 22–32)
CREATININE: 0.78 mg/dL (ref 0.44–1.00)
Calcium: 8.8 mg/dL — ABNORMAL LOW (ref 8.9–10.3)
GFR calc Af Amer: >60 mL/min (ref >60)
Glucose, Bld: 149 mg/dL — ABNORMAL HIGH (ref 65–99)
POTASSIUM: 3.3 mmol/L — AB (ref 3.5–5.1)
SODIUM: 139 mmol/L (ref 135–145)
Total Bilirubin: 0.7 mg/dL (ref 0.3–1.2)
Total Protein: 6.8 g/dL (ref 6.5–8.1)

## 2015-08-12 LAB — TROPONIN I
TROPONIN I: 0.04 ng/mL — AB (ref <0.031)
TROPONIN I: 0.04 ng/mL — AB (ref <0.031)

## 2015-08-12 MED ORDER — MAGNESIUM OXIDE 400 (241.3 MG) MG PO TABS
400.0000 mg | ORAL_TABLET | Freq: Every day | ORAL | Status: DC
  Administered 2015-08-13 – 2015-08-14 (×2): 400 mg via ORAL
  Filled 2015-08-12 (×4): qty 1

## 2015-08-12 MED ORDER — ONDANSETRON HCL 4 MG/2ML IJ SOLN: 4.0000 mg | Freq: Four times a day (QID) | INTRAMUSCULAR | Status: DC | PRN

## 2015-08-12 MED ORDER — CYCLOBENZAPRINE HCL 10 MG PO TABS
5.0000 mg | ORAL_TABLET | Freq: Two times a day (BID) | ORAL | Status: DC | PRN
  Administered 2015-08-13 – 2015-08-14 (×2): 5 mg via ORAL
  Filled 2015-08-12 (×2): qty 1

## 2015-08-12 MED ORDER — LEVOFLOXACIN IN D5W 500 MG/100ML IV SOLN
500.0000 mg | INTRAVENOUS | Status: DC
  Administered 2015-08-12: 500 mg via INTRAVENOUS
  Filled 2015-08-12: qty 100

## 2015-08-12 MED ORDER — GABAPENTIN 300 MG PO CAPS
300.0000 mg | ORAL_CAPSULE | Freq: Three times a day (TID) | ORAL | Status: DC
  Administered 2015-08-12 – 2015-08-14 (×6): 300 mg via ORAL
  Filled 2015-08-12 (×3): qty 1
  Filled 2015-08-12: qty 3
  Filled 2015-08-12 (×2): qty 1

## 2015-08-12 MED ORDER — SODIUM CHLORIDE 0.9 % IJ SOLN
3.0000 mL | Freq: Two times a day (BID) | INTRAMUSCULAR | Status: DC
  Administered 2015-08-12 – 2015-08-14 (×4): 3 mL via INTRAVENOUS

## 2015-08-12 MED ORDER — VITAMIN D 1000 UNITS PO TABS
1000.0000 [IU] | ORAL_TABLET | Freq: Every day | ORAL | Status: DC
  Administered 2015-08-13 – 2015-08-14 (×2): 1000 [IU] via ORAL
  Filled 2015-08-12 (×4): qty 1

## 2015-08-12 MED ORDER — NITROGLYCERIN 0.4 MG SL SUBL: 0.4000 mg | SUBLINGUAL_TABLET | SUBLINGUAL | Status: DC | PRN

## 2015-08-12 MED ORDER — ACETAMINOPHEN-CODEINE #3 300-30 MG PO TABS
1.0000 | ORAL_TABLET | Freq: Two times a day (BID) | ORAL | Status: DC | PRN
  Administered 2015-08-13 – 2015-08-14 (×2): 1 via ORAL
  Filled 2015-08-12 (×3): qty 1

## 2015-08-12 MED ORDER — ONDANSETRON HCL 4 MG PO TABS: 4.0000 mg | ORAL_TABLET | Freq: Four times a day (QID) | ORAL | Status: DC | PRN

## 2015-08-12 MED ORDER — POTASSIUM CHLORIDE CRYS ER 20 MEQ PO TBCR
40.0000 meq | EXTENDED_RELEASE_TABLET | Freq: Every day | ORAL | Status: DC
  Administered 2015-08-13 – 2015-08-14 (×2): 40 meq via ORAL
  Filled 2015-08-12 (×2): qty 2

## 2015-08-12 MED ORDER — CARVEDILOL 3.125 MG PO TABS
3.1250 mg | ORAL_TABLET | Freq: Two times a day (BID) | ORAL | Status: DC
  Filled 2015-08-12 (×3): qty 1

## 2015-08-12 MED ORDER — FUROSEMIDE 40 MG PO TABS
60.0000 mg | ORAL_TABLET | Freq: Two times a day (BID) | ORAL | Status: DC
  Administered 2015-08-13 – 2015-08-14 (×4): 60 mg via ORAL
  Filled 2015-08-12 (×8): qty 1

## 2015-08-12 MED ORDER — ENOXAPARIN SODIUM 40 MG/0.4ML ~~LOC~~ SOLN
40.0000 mg | SUBCUTANEOUS | Status: DC
  Administered 2015-08-13 – 2015-08-14 (×2): 40 mg via SUBCUTANEOUS
  Filled 2015-08-12 (×2): qty 0.4

## 2015-08-12 MED ORDER — ADULT MULTIVITAMIN W/MINERALS CH
1.0000 | ORAL_TABLET | Freq: Every day | ORAL | Status: DC
  Administered 2015-08-13 – 2015-08-14 (×2): 1 via ORAL
  Filled 2015-08-12 (×2): qty 1

## 2015-08-12 NOTE — ED Notes (Signed)
Patient states that she was having a fullness in the center of her chest and that she was sweating.

## 2015-08-12 NOTE — H&P (Signed)
[facility] History and Physical  [name] ZOX:[id number] DOB: [birth year] DOA: 08/12/2015  Referring physician: ER PCP: Alice Reichert, MD   Chief Complaint: Chest pain  HPI: [name] is a 81 y.o. female  This is an 81 year old lady with previous history of 2-1 AV block requiring pacemaker, history of congestive heart failure and pulmonary hypertension who now presents with chest pain/tightness which started at 3 PM today. She cannot specify for how long but she felt unwell with it, had dyspnea and was sweating. She denies any cough or fevers. She also apparently became short of breath yesterday according to her son, who is at the bedside. She has since improved as far as today is concerned. Evaluation in the emergency room shows slight elevation of troponin. Chest x-ray is somewhat abnormal with possibility of atelectasis or infiltrate in the left lung. She is now being admitted for further investigation and management.   Review of Systems:  Apart from symptoms above, all systems negative.   Past Medical History  Diagnosis Date  . Edema     . EF 65-70%, echo, November 29, 2010  . Pulmonary hypertension      46 mmHg... echo... January, 2012  . Cellulitis     Superficial cellulitis right lower leg   January 14, 2011  . AV block      2:1  January 14, 2011 / pacemaker plan when cellulitis resolved in length  . Bradycardia     2:1  AV block  . Hypokalemia     October, 2012  . Ejection fraction     EF 65%, echo, January, 2012  . Murmur     No significant valvular disease by echo, January, 2012  . Pacemaker     January, 2013  . Chronic diastolic CHF (congestive heart failure)   . Hypertension   . Arthritis     "qwhere"   Past Surgical History  Procedure Laterality Date  . Hip arthroplasty Right   . Partial hysterectomy      vaginal  . Cholecystectomy    . Insert / replace / remove pacemaker  11/2011  . Permanent pacemaker insertion N/A 12/09/2011   Procedure: PERMANENT PACEMAKER INSERTION;  Surgeon: Marinus Maw, MD;  Location: [facility] CATH LAB;  Service: Cardiovascular;  Laterality: N/A;   Social History:  reports that she has never smoked. She has never used smokeless tobacco. She reports that she does not drink alcohol or use illicit drugs.  Allergies  Allergen Reactions  . Aspirin Rash and Other (See Comments)    GI distress  . Tramadol Other (See Comments)    GI distress    Family History  Problem Relation Age of Onset  . Cardiomyopathy Father   . Diabetes Father   . Heart failure Father   . Cancer Son   . Coronary artery disease Son   . Dementia Mother     Prior to Admission medications   Medication Sig Start Date End Date Taking? Authorizing Provider  Acetaminophen-Codeine 300-30 MG per tablet Take 1 tablet by mouth 2 (two) times daily as needed for pain. For leg pain Patient taking differently: Take 1 tablet by mouth 2 (two) times daily as needed for pain.  04/06/15  Yes Luis Abed, MD  carvedilol (COREG) 3.125 MG tablet Take 3.125 mg by mouth 2 (two) times daily with a meal.   Yes Historical Provider, MD  cholecalciferol (VITAMIN D) 1000 UNITS tablet Take 1,000 Units by mouth daily.   Yes  Historical Provider, MD  cyclobenzaprine (FLEXERIL) 5 MG tablet Take 5 mg by mouth 2 (two) times daily as needed for muscle spasms.    Yes Historical Provider, MD  furosemide (LASIX) 20 MG tablet Take 3 tablets (60 mg total) by mouth 2 (two) times daily. 01/19/15  Yes Luis Abed, MD  gabapentin (NEURONTIN) 300 MG capsule Take 1 capsule (300 mg total) by mouth 3 (three) times daily. 05/09/15  Yes Jeffrey Hedges, PA-C  magnesium oxide (MAG-OX) 400 (241.3 MG) MG tablet TAKE ONE TABLET BY MOUTH ONE TIME DAILY 04/02/15  Yes Luis Abed, MD  metolazone (ZAROXOLYN) 2.5 MG tablet Take 1 tab daily as needed for daily weight of 172 lbs or greater Patient taking differently: Take 2.5 mg by mouth daily as needed (for weight of 172 lbs or  more).  05/04/15  Yes Luis Abed, MD  Multiple Vitamin (MULTIVITAMIN WITH MINERALS) TABS tablet Take 1 tablet by mouth daily.   Yes Historical Provider, MD  potassium chloride SA (K-DUR,KLOR-CON) 20 MEQ tablet TAKE 3 TABLETS (60 MEQ TOTAL) BY MOUTH DAILY. 04/02/15  Yes Luis Abed, MD  nitroGLYCERIN (NITROSTAT) 0.4 MG SL tablet Place 0.4 mg under the tongue every 5 (five) minutes as needed for chest pain.    Historical Provider, MD   Physical Exam: Filed Vitals:   08/12/15 2000 08/12/15 2030 08/12/15 2105 08/12/15 2212  BP: 112/84 151/91 149/100 124/107  Pulse: 103 108  107  Temp:      TempSrc:      Resp: 26 16 14 20   Height:      SpO2: 98% 98%  99%    Wt Readings from Last 3 Encounters:  05/09/15 76.6 kg (168 lb 14 oz)  05/01/15 76.613 kg (168 lb 14.4 oz)  04/06/15 77.656 kg (171 lb 3.2 oz)    General:  Appears calm and comfortable. She does not appear to be in respiratory distress at rest. She is saturating adequately. There is no peripheral or central cyanosis. Eyes: PERRL, normal lids, irises & conjunctiva ENT: grossly normal hearing, lips & tongue Neck: no LAD, masses or thyromegaly Cardiovascular: RRR, no m/r/g. No LE edema. Telemetry: SR, no arrhythmias  Respiratory: CTA bilaterally, no w/r/r. Normal respiratory effort. Abdomen: soft, ntnd Skin: no rash or induration seen on limited exam Musculoskeletal: grossly normal tone BUE/BLE Psychiatric: grossly normal mood and affect, speech fluent and appropriate Neurologic: grossly non-focal.          Labs on Admission:  Basic Metabolic Panel:  Recent Labs Lab 08/12/15 2019  NA 139  K 3.3*  CL 99*  CO2 32  GLUCOSE 149*  BUN 15  CREATININE 0.78  CALCIUM 8.8*   Liver Function Tests:  Recent Labs Lab 08/12/15 2019  AST 28  ALT 14  ALKPHOS 97  BILITOT 0.7  PROT 6.8  ALBUMIN 3.5   No results for input(s): LIPASE, AMYLASE in the last 168 hours. No results for input(s): AMMONIA in the last 168  hours. CBC:  Recent Labs Lab 08/12/15 2019  WBC 7.8  NEUTROABS 4.6  HGB 14.2  HCT 42.4  MCV 89.8  PLT 246   Cardiac Enzymes:  Recent Labs Lab 08/12/15 2019  TROPONINI 0.04*    BNP (last 3 results)  Recent Labs  04/30/15 0040 05/09/15 1900 08/12/15 2018  BNP 81.0 83.7 89.0    ProBNP (last 3 results) No results for input(s): PROBNP in the last 8760 hours.  CBG: No results for input(s): GLUCAP in the last  168 hours.  Radiological Exams on Admission: Dg Chest 2 View  08/12/2015   CLINICAL DATA:  Chest pain and shortness of breath.  EXAM: CHEST  2 VIEW  COMPARISON:  04/30/2015 and 08/06/2014  FINDINGS: There is a focal area of abnormal density posterior medially at the left lung base with loss of the silhouette of the posterior aspect of the left hemidiaphragm. I suspect this represents a focal area of infiltrate or atelectasis.  There is chronic elevation of the right hemidiaphragm. Stable slight scarring at the right base.  Heart size and pulmonary vascularity are normal. Calcification in the thoracic aorta. No acute osseous abnormality. Severe arthritis of both shoulders.  IMPRESSION: Area of atelectasis or infiltrate at the left base posterior medially.  Aortic atherosclerosis.   Electronically Signed   By: Francene Boyers M.D.   On: 08/12/2015 21:00    EKG: Independently reviewed. Sinus rhythm, possibly paced rhythm. Does not appear to be any acute ST elevation.  Assessment/Plan   1. Chest pain. Etiology is unclear. Cycle cardiac enzymes. Cardiology consultation in the morning. 2. Possible pneumonia. I will treat her empirically with intravenous Levaquin. 3. Hypertension. Stable.  She will be admitted under observation. Further recommendations will depend on patient's hospital progress.   Code Status: Full code.  DVT Prophylaxis: Lovenox.  Family Communication: I discussed the plan with the patient and patient's son at the bedside.   Disposition Plan: Home  when medically stable.   Time spent: 45 minutes.  Wilson Singer [facility] Pager [phone].

## 2015-08-12 NOTE — ED Notes (Addendum)
Patient complaining of pain in her left leg. States that it is cramping. MD notified.

## 2015-08-12 NOTE — ED Notes (Signed)
Burning in esophageal area since around 3 pm today per EMS, Has not taken anything for it but her regular meds.

## 2015-08-12 NOTE — ED Provider Notes (Signed)
CSN: [id number]     Arrival date & time 08/12/15  1948 History   First MD Initiated Contact with Patient 08/12/15 1957     Chief Complaint  Patient presents with  . Chest Pain     (Consider location/radiation/quality/duration/timing/severity/associated sxs/prior Treatment) HPI Comments: Patient is an 81 year old female with history of heart block requiring pacemaker, congestive heart failure, pulmonary hypertension. She is brought by EMS for evaluation of tightness in her chest and feeling sweaty while at home this evening. She denies any fevers or productive cough. These symptoms started several hours ago and have improved somewhat since being transported here. She denies any prior history of heart attack or stents.  Patient is a 81 y.o. female presenting with chest pain. The history is provided by the patient.  Chest Pain Pain location:  Substernal area Pain quality: tightness   Pain radiates to:  Does not radiate Pain severity:  Moderate Timing:  Constant Progression:  Unchanged Chronicity:  New Relieved by:  Nothing Worsened by:  Nothing tried Ineffective treatments:  None tried   Past Medical History  Diagnosis Date  . Edema     . EF 65-70%, echo, November 29, 2010  . Pulmonary hypertension      46 mmHg... echo... January, 2012  . Cellulitis     Superficial cellulitis right lower leg   January 14, 2011  . AV block      2:1  January 14, 2011 / pacemaker plan when cellulitis resolved in length  . Bradycardia     2:1  AV block  . Hypokalemia     October, 2012  . Ejection fraction     EF 65%, echo, January, 2012  . Murmur     No significant valvular disease by echo, January, 2012  . Pacemaker     January, 2013  . Chronic diastolic CHF (congestive heart failure)   . Hypertension   . Arthritis     "qwhere"   Past Surgical History  Procedure Laterality Date  . Hip arthroplasty Right   . Partial hysterectomy      vaginal  . Cholecystectomy    . Insert / replace /  remove pacemaker  11/2011  . Permanent pacemaker insertion N/A 12/09/2011    Procedure: PERMANENT PACEMAKER INSERTION;  Surgeon: Marinus Maw, MD;  Location: [facility] CATH LAB;  Service: Cardiovascular;  Laterality: N/A;   Family History  Problem Relation Age of Onset  . Cardiomyopathy Father   . Diabetes Father   . Heart failure Father   . Cancer Son   . Coronary artery disease Son   . Dementia Mother    Social History  Substance Use Topics  . Smoking status: Never Smoker   . Smokeless tobacco: Never Used  . Alcohol Use: No   OB History    No data available     Review of Systems  Cardiovascular: Positive for chest pain.  All other systems reviewed and are negative.     Allergies  Aspirin and Tramadol  Home Medications   Prior to Admission medications   Medication Sig Start Date End Date Taking? Authorizing Provider  Acetaminophen-Codeine 300-30 MG per tablet Take 1 tablet by mouth 2 (two) times daily as needed for pain. For leg pain Patient taking differently: Take 1 tablet by mouth 2 (two) times daily as needed for pain.  04/06/15   Luis Abed, MD  carvedilol (COREG) 3.125 MG tablet Take 3.125 mg by mouth 2 (two) times daily with a meal.  Historical Provider, MD  cholecalciferol (VITAMIN D) 1000 UNITS tablet Take 1,000 Units by mouth daily.    Historical Provider, MD  cyclobenzaprine (FLEXERIL) 5 MG tablet Take 5 mg by mouth 2 (two) times daily as needed for muscle spasms.     Historical Provider, MD  furosemide (LASIX) 20 MG tablet Take 3 tablets (60 mg total) by mouth 2 (two) times daily. 01/19/15   Luis Abed, MD  gabapentin (NEURONTIN) 300 MG capsule Take 1 capsule (300 mg total) by mouth 3 (three) times daily. 05/09/15   Eyvonne Mechanic, PA-C  magnesium oxide (MAG-OX) 400 (241.3 MG) MG tablet TAKE ONE TABLET BY MOUTH ONE TIME DAILY 04/02/15   Luis Abed, MD  metolazone (ZAROXOLYN) 2.5 MG tablet Take 1 tab daily as needed for daily weight of 172 lbs or  greater Patient taking differently: Take 2.5 mg by mouth daily as needed (for weight of 172 lbs or more).  05/04/15   Luis Abed, MD  Multiple Vitamin (MULTIVITAMIN WITH MINERALS) TABS tablet Take 1 tablet by mouth daily.    Historical Provider, MD  nitroGLYCERIN (NITROSTAT) 0.4 MG SL tablet Place 0.4 mg under the tongue every 5 (five) minutes as needed for chest pain.    Historical Provider, MD  potassium chloride SA (K-DUR,KLOR-CON) 20 MEQ tablet TAKE 3 TABLETS (60 MEQ TOTAL) BY MOUTH DAILY. 04/02/15   Luis Abed, MD   BP 111/76 mmHg  Pulse 107  Temp(Src) 98.4 F (36.9 C) (Oral)  Resp 18  Ht 5' (1.524 m)  SpO2 97% Physical Exam  Constitutional: She is oriented to person, place, and time. She appears well-developed and well-nourished. No distress.  HENT:  Head: Normocephalic and atraumatic.  Neck: Normal range of motion. Neck supple.  Cardiovascular: Normal rate and regular rhythm.  Exam reveals no gallop and no friction rub.   No murmur heard. Pulmonary/Chest: Effort normal and breath sounds normal. No respiratory distress. She has no wheezes.  Abdominal: Soft. Bowel sounds are normal. She exhibits no distension. There is no tenderness.  Musculoskeletal: Normal range of motion. She exhibits edema.  There is 1+ edema of the bilateral lower extremities.  Neurological: She is alert and oriented to person, place, and time.  Skin: Skin is warm and dry. She is not diaphoretic.  Nursing note and vitals reviewed.   ED Course  Procedures (including critical care time) Labs Review Labs Reviewed  COMPREHENSIVE METABOLIC PANEL  TROPONIN I  CBC WITH DIFFERENTIAL/PLATELET    Imaging Review No results found. I have personally reviewed and evaluated these images and lab results as part of my medical decision-making.   EKG Interpretation   Date/Time:  Wednesday August 12 2015 19:51:39 EDT Ventricular Rate:  104 PR Interval:  176 QRS Duration: 146 QT Interval:  388 QTC  Calculation: 510 R Axis:   -75 Text Interpretation:  Sinus tachycardia , probable paced rhythm Probable  left atrial enlargement LVH with IVCD, LAD and secondary repol abnrm  Anterolateral infarct, old Prolonged QT interval No significant change  since last tracing Reconfirmed by KNAPP  MD-J, JON ([id number]) on 08/12/2015  7:54:34 PM      MDM   Final diagnoses:  None    Patient presents with complaints of tightness in her chest. She has a history of pacemaker, but no other cardiac history. Her initial troponin is 0.04 and EKG reveals a paced rhythm. Due to her age and fact that she lives alone, I feel as though admission for observation and rule  out of MI is appropriate. She will be admitted to the hospitalist service under the care of Dr. Karilyn Cota.    Geoffery Lyons, MD 08/12/15 2203

## 2015-08-13 ENCOUNTER — Observation Stay (HOSPITAL_BASED_OUTPATIENT_CLINIC_OR_DEPARTMENT_OTHER): Payer: Medicare Other

## 2015-08-13 ENCOUNTER — Encounter (HOSPITAL_COMMUNITY): Payer: Self-pay | Admitting: Adult Health

## 2015-08-13 ENCOUNTER — Observation Stay (HOSPITAL_COMMUNITY): Payer: Medicare Other

## 2015-08-13 DIAGNOSIS — R079 Chest pain, unspecified: Principal | ICD-10-CM

## 2015-08-13 DIAGNOSIS — I4430 Unspecified atrioventricular block: Secondary | ICD-10-CM | POA: Diagnosis not present

## 2015-08-13 DIAGNOSIS — J90 Pleural effusion, not elsewhere classified: Secondary | ICD-10-CM | POA: Diagnosis not present

## 2015-08-13 DIAGNOSIS — R011 Cardiac murmur, unspecified: Secondary | ICD-10-CM | POA: Diagnosis not present

## 2015-08-13 DIAGNOSIS — E876 Hypokalemia: Secondary | ICD-10-CM | POA: Diagnosis not present

## 2015-08-13 LAB — CBC
HEMATOCRIT: 47.5 % — AB (ref 36.0–46.0)
Hemoglobin: 15.6 g/dL — ABNORMAL HIGH (ref 12.0–15.0)
MCH: 29.7 pg (ref 26.0–34.0)
MCHC: 32.8 g/dL (ref 30.0–36.0)
MCV: 90.5 fL (ref 78.0–100.0)
Platelets: 218 10*3/uL (ref 150–400)
RBC: 5.25 MIL/uL — ABNORMAL HIGH (ref 3.87–5.11)
RDW: 17 % — AB (ref 11.5–15.5)
WBC: 7.1 10*3/uL (ref 4.0–10.5)

## 2015-08-13 LAB — COMPREHENSIVE METABOLIC PANEL
ALBUMIN: 3.4 g/dL — AB (ref 3.5–5.0)
ALK PHOS: 90 U/L (ref 38–126)
ALT: 11 U/L — ABNORMAL LOW (ref 14–54)
ANION GAP: 11 (ref 5–15)
AST: 28 U/L (ref 15–41)
BUN: 16 mg/dL (ref 6–20)
CALCIUM: 8.8 mg/dL — AB (ref 8.9–10.3)
CO2: 27 mmol/L (ref 22–32)
Chloride: 100 mmol/L — ABNORMAL LOW (ref 101–111)
Creatinine, Ser: 0.78 mg/dL (ref 0.44–1.00)
GFR calc non Af Amer: >60 mL/min (ref >60)
Glucose, Bld: 130 mg/dL — ABNORMAL HIGH (ref 65–99)
POTASSIUM: 3.3 mmol/L — AB (ref 3.5–5.1)
SODIUM: 138 mmol/L (ref 135–145)
TOTAL PROTEIN: 6.7 g/dL (ref 6.5–8.1)
Total Bilirubin: 0.8 mg/dL (ref 0.3–1.2)

## 2015-08-13 LAB — MAGNESIUM: Magnesium: 1.8 mg/dL (ref 1.7–2.4)

## 2015-08-13 LAB — TROPONIN I
TROPONIN I: 0.03 ng/mL (ref <0.031)
Troponin I: 0.04 ng/mL — ABNORMAL HIGH (ref <0.031)

## 2015-08-13 MED ORDER — CETYLPYRIDINIUM CHLORIDE 0.05 % MT LIQD
7.0000 mL | Freq: Two times a day (BID) | OROMUCOSAL | Status: DC
  Administered 2015-08-13 – 2015-08-14 (×3): 7 mL via OROMUCOSAL

## 2015-08-13 MED ORDER — IOHEXOL 300 MG/ML  SOLN
75.0000 mL | Freq: Once | INTRAMUSCULAR | Status: AC | PRN
  Administered 2015-08-13: 75 mL via INTRAVENOUS

## 2015-08-13 MED ORDER — POTASSIUM CHLORIDE CRYS ER 20 MEQ PO TBCR
40.0000 meq | EXTENDED_RELEASE_TABLET | Freq: Once | ORAL | Status: AC
  Administered 2015-08-13: 40 meq via ORAL
  Filled 2015-08-13: qty 2

## 2015-08-13 MED ORDER — LEVOFLOXACIN IN D5W 500 MG/100ML IV SOLN
500.0000 mg | INTRAVENOUS | Status: DC
  Administered 2015-08-13: 500 mg via INTRAVENOUS
  Filled 2015-08-13: qty 100

## 2015-08-13 MED ORDER — CARVEDILOL 3.125 MG PO TABS
6.2500 mg | ORAL_TABLET | Freq: Two times a day (BID) | ORAL | Status: DC
  Administered 2015-08-13 – 2015-08-14 (×3): 6.25 mg via ORAL
  Filled 2015-08-13 (×3): qty 2

## 2015-08-13 NOTE — Evaluation (Signed)
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 She cannot specify for how long but she felt unwell with it, had dyspnea and was sweating. She denies any cough or fevers. She also apparently became short of breath yesterday according to her son, who is at the bedside. She has since improved as far as today is concerned. Evaluation in the emergency room shows slight elevation of troponin. Chest x-ray is somewhat abnormal with possibility of atelectasis or infiltrate in the left lung. She is now being admitted for further investigation and management.  Clinical Impression   Pt was seen for evaluation.  She is well known to this therapist from previous admissions.  Pt lives alone with full time daytime assist.  She is nonambulatory and has been declining in mobility, needing 2 person assist for transfers.  Apparently, some of her home assistance will not be available in the very near future.  Family is concerned that pt will not be able to stay alone some of the day.  Pt has generalized weakness along with morbid obesity and severe LE spasms that plague her.  It took max assist to transfer her to the EOB today where her sitting balance was poor.  I was unable to safely transfer her from bed to chair.  A lift will be required.  Because of the significant amount of care she requires, I believe that she could not be managed well at [facility].  I am therefore recommending that she transfer to a long term nursing home setting or home with full time assist and a Hoyer lift.    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 She cannot specify for how long but she felt unwell with it, had dyspnea and was sweating. She denies any cough or fevers. She also apparently became short of breath yesterday according to her son, who is at the bedside. She has since improved as far as today is concerned. Evaluation in the emergency room shows slight elevation of troponin. Chest x-ray is somewhat abnormal with possibility of atelectasis or infiltrate in the left lung. She is now being admitted for further investigation and management.      Mobility  Bed Mobility Overal bed mobility: Needs Assistance Bed Mobility: Supine to Sit     Supine to sit: Total assist;HOB elevated        Transfers                 General transfer comment: pt had great difficulty sitting at EOB due to spasms in the LEs...she tends to fall backward  Ambulation/Gait             General Gait Details: unable  Information systems manager  Rankin (Stroke Patients Only)       Balance Overall balance assessment: Needs assistance Sitting-balance support: Single extremity supported;Feet supported Sitting balance-Leahy Scale: Poor Sitting balance - Comments: tnds to fall backward                                     Pertinent Vitals/Pain Pain Assessment: No/denies pain    Home Living Family/patient expects to be discharged to:: Skilled nursing facility (long term care nursing home)                      Prior Function Level of Independence: Needs assistance   Gait /  Transfers Assistance Needed: non ambulatory, needs assist of 2 to transfer bed to chair  ADL's / Homemaking Assistance Needed: assist with meals and bathing from CAP aide 3 hours/day        Hand Dominance        Extremity/Trunk Assessment   Upper Extremity Assessment: Generalized weakness           Lower Extremity Assessment: Generalized weakness      Cervical / Trunk Assessment: Kyphotic  Communication      Cognition Arousal/Alertness: Awake/alert Behavior During Therapy: WFL for tasks assessed/performed Overall Cognitive Status: Within Functional Limits for tasks assessed                      General Comments      Exercises        Assessment/Plan    PT Assessment Patent does not need any further PT services  PT Diagnosis     PT Problem List    PT Treatment Interventions     PT Goals (Current goals can be found in the Care Plan section) Acute Rehab PT Goals PT Goal Formulation: All assessment and education complete, DC therapy    Frequency     Barriers to discharge        Co-evaluation               End of Session Equipment Utilized During Treatment: Gait belt Activity Tolerance: Patient tolerated treatment well Patient left: in bed;with call bell/phone within reach;with bed alarm set Nurse Communication: Mobility status    Functional Assessment Tool Used: clinical judgement Functional Limitation: Mobility: Walking and moving around Mobility: Walking and Moving Around Current Status (A0762): 100 percent impaired, limited or restricted Mobility: Walking and Moving Around Goal Status [phone]): 100 percent impaired, limited or restricted Mobility: Walking and Moving Around Discharge Status [phone]): 100 percent impaired, limited or restricted    Time: 1518-1610 PT Time Calculation (min) (ACUTE ONLY): 52 min   Charges:   PT Evaluation $Initial PT Evaluation Tier I: 1 Procedure PT Treatments $Therapeutic Activity: 8-22 mins   PT  G Codes:   PT G-Codes **NOT FOR INPATIENT CLASS** Functional Assessment Tool Used: clinical judgement Functional Limitation: Mobility: Walking and moving around Mobility: Walking and Moving Around Current Status (B6389): 100 percent impaired, limited or restricted Mobility: Walking and Moving Around Goal Status (H7342): 100 percent impaired, limited or restricted Mobility: Walking and Moving Around Discharge Status (A7681): 100 percent impaired, limited or restricted    [place], [place]  PT 08/13/2015, 4:30 PM [phone]

## 2015-08-13 NOTE — Progress Notes (Signed)
Subjective: The patient is fairly comfortable at the time of this exam. She was admitted with chest pain and palpitations. She is noted to have a low serum potassium level. An x-ray evidence of possible early pneumonia. She does have a prior history of diastolic CHF and chronic low back pain and hip pain secondary to osteoarthritis and degenerative changes  Objective: Vital signs in last 24 hours: Temp:  [98 F (36.7 C)-98.4 F (36.9 C)] 98.1 F (36.7 C) (09/22 1458) Pulse Rate:  [90-108] 107 (09/22 1458) Resp:  [14-26] 20 (09/22 1458) BP: (94-153)/(42-107) 104/57 mmHg (09/22 1458) SpO2:  [96 %-100 %] 100 % (09/22 1458) Weight:  [71.442 kg (157 lb 8 oz)-72.122 kg (159 lb)] 71.442 kg (157 lb 8 oz) (09/22 0541) Weight change:  Last BM Date: 08/12/15  Intake/Output from previous day: 09/21 0701 - 09/22 0700 In: 3 [I.V.:3] Out: -  Intake/Output this shift: Total I/O In: 840 [P.O.:840] Out: -   Physical Exam: General appearance patient appears alert and fairly comfortable  HEENT negative  Neck supple no JVD or thyroid abnormalities  Heart regular rhythm no murmurs  Lungs clear to P&A  Abdomen no palpable organs or masses  Skin warm and dry extremities free of edema  Back paraspinous muscle tenderness over the lower back   Recent Labs  08/12/15 2019 08/13/15 0550  WBC 7.8 7.1  HGB 14.2 15.6*  HCT 42.4 47.5*  PLT 246 218   BMET  Recent Labs  08/12/15 2019 08/13/15 0415  NA 139 138  K 3.3* 3.3*  CL 99* 100*  CO2 32 27  GLUCOSE 149* 130*  BUN 15 16  CREATININE 0.78 0.78  CALCIUM 8.8* 8.8*    Studies/Results: Dg Chest 2 View  08/12/2015   CLINICAL DATA:  Chest pain and shortness of breath.  EXAM: CHEST  2 VIEW  COMPARISON:  04/30/2015 and 08/06/2014  FINDINGS: There is a focal area of abnormal density posterior medially at the left lung base with loss of the silhouette of the posterior aspect of the left hemidiaphragm. I suspect this represents a focal  area of infiltrate or atelectasis.  There is chronic elevation of the right hemidiaphragm. Stable slight scarring at the right base.  Heart size and pulmonary vascularity are normal. Calcification in the thoracic aorta. No acute osseous abnormality. Severe arthritis of both shoulders.  IMPRESSION: Area of atelectasis or infiltrate at the left base posterior medially.  Aortic atherosclerosis.   Electronically Signed   By: Francene Boyers M.D.   On: 08/12/2015 21:00   Ct Chest W Contrast  08/13/2015   CLINICAL DATA:  81 year old female with shortness of breath on exertion. Lower extremity pain. Recent increased shortness of breath for 2 days. Personal history of cardiac pacemaker.  EXAM: CT CHEST WITH CONTRAST  TECHNIQUE: Multidetector CT imaging of the chest was performed during intravenous contrast administration.  CONTRAST:  75mL OMNIPAQUE IOHEXOL 300 MG/ML  SOLN  COMPARISON:  Chest radiographs 08/12/2015 and earlier. Cervical spine CT 04/21/2014.  FINDINGS: Low lung volumes with atelectatic changes to the major airways and mild respiratory motion artifact. Major airways appear patent. Increased elevation of the right hemidiaphragm. There is a small layering left pleural effusion with adjacent compressive atelectasis of the left lower lobe. Mild additional atelectasis in the medial segment of the right middle lobe and also in the right lower lobe. No other confluent pulmonary opacity.  Left chest transvenous cardiac pacemaker type device. Cardiomegaly with calcified coronary artery atherosclerosis. No pericardial effusion. No  mediastinal lymphadenopathy. No axillary lymphadenopathy. Subcentimeter left thyroid nodules, do not meet size criteria for ultrasound follow-up. Calcified atherosclerosis of the aorta in the chest and visualized upper abdomen.  Surgically absent gallbladder. Negative visualized liver, spleen, pancreas (fatty atrophied) adrenal glands, kidneys, and bowel in the upper abdomen.  Advanced  degenerative changes in the spine and at the shoulders. There is some superimposed scoliosis. No acute osseous abnormality identified.  IMPRESSION: 1. Small layering left pleural effusion with left greater than right pulmonary atelectasis. 2. No other acute findings in the chest. Cardiomegaly. Calcified aortic and coronary artery atherosclerosis.   Electronically Signed   By: Odessa Fleming M.D.   On: 08/13/2015 13:12    Medications:  . antiseptic oral rinse  7 mL Mouth Rinse BID  . carvedilol  6.25 mg Oral BID WC  . cholecalciferol  1,000 Units Oral Daily  . enoxaparin (LOVENOX) injection  40 mg Subcutaneous Q24H  . furosemide  60 mg Oral BID  . gabapentin  300 mg Oral TID  . levofloxacin (LEVAQUIN) IV  500 mg Intravenous Q24H  . magnesium oxide  400 mg Oral Daily  . multivitamin with minerals  1 tablet Oral Daily  . potassium chloride SA  40 mEq Oral Daily  . sodium chloride  3 mL Intravenous Q12H        Assessment/Plan: 1. Atypical chest pain cycle enzymes  2. Diastolic congestive heart failure to continue furosemide 60 mg twice a day-continue carvedilol 6.25 mg twice a day  3. Low serum potassium to replete potassium continue 40 mEq daily repeat chemistries in a.m.    4. Possible early pneumonia we'll continue Levaquin as ordered  [name] G 08/13/2015, 5:07 PM

## 2015-08-13 NOTE — Consult Note (Signed)
CARDIOLOGY CONSULT NOTE   [name] ID: [name] MRN: [id number] DOB/AGE: Jun 08, 1931 81 y.o.  Admit Date: 08/12/2015 Referring Physician: Butch Penny MD Primary Physician: Alice Reichert, MD Consulting Cardiologist: Dina Rich, MD Primary Cardiologist: Everette Rank MD Reason for Consultation: Chest Pain  Clinical Summary [name] is a 81 y.o.female with know history of diastolic CHF, hx of AV block with bradycardia, s/p PPM (Metronic) , hypertension,pulmonary hypertension, and severe deconditioning-has refused PT although family requests placement., who was admitted with "chest pain". [name] was last seen by Dr. Myrtis Ser on 08/03/2015. [name] has been unable to stand on scale t weigh herself and therefore has been difficult to manage.   On further questioning, [name] did not have chest pain but describes fluttering and a "funny feeling" associated with diaphoreses. A home health nurse checked her and found her to be tachycardic. [name] called EMS.   On admission [name] was found to be hypokalemic with potassium of 3.3; troponin has been negative x 3. EKG sinus tachycardia with LVH rate of 104. CXR demonstrated area of atelectasis or infiltrate at [name] left base posterior, medially. Wt on admission 157 lbs, last weight recorded from 05/01/2015 was 168 lbs.   In ER BP was 11/76, HR 107, O2 Sat 97%. [name] was afebrile. [name] was not given potassium supplement, but was provided gabapentin and started on levaquin. Potassium has been given this am.    Allergies  Allergen Reactions  . Aspirin Rash and Other (See Comments)    GI distress  . Tramadol Other (See Comments)    GI distress    Medications Scheduled Medications: . antiseptic oral rinse  7 mL Mouth Rinse BID  . carvedilol  3.125 mg Oral BID WC  . cholecalciferol  1,000 Units Oral Daily  . enoxaparin (LOVENOX) injection  40 mg Subcutaneous Q24H  . furosemide  60 mg Oral BID  . gabapentin  300 mg Oral TID  .  levofloxacin (LEVAQUIN) IV  500 mg Intravenous Q24H  . magnesium oxide  400 mg Oral Daily  . multivitamin with minerals  1 tablet Oral Daily  . potassium chloride SA  40 mEq Oral Daily  . potassium chloride  40 mEq Oral Once  . sodium chloride  3 mL Intravenous Q12H    Infusions:    PRN Medications: acetaminophen-codeine, cyclobenzaprine, nitroGLYCERIN, ondansetron **OR** ondansetron (ZOFRAN) IV   Past Medical History  Diagnosis Date  . Edema     . EF 65-70%, echo, November 29, 2010  . Pulmonary hypertension      46 mmHg... echo... January, 2012  . Cellulitis     Superficial cellulitis right lower leg   January 14, 2011  . AV block      2:1  January 14, 2011 / pacemaker plan when cellulitis resolved in length  . Bradycardia     2:1  AV block  . Hypokalemia     October, 2012  . Ejection fraction     EF 65%, echo, January, 2012  . Murmur     No significant valvular disease by echo, January, 2012  . Pacemaker     January, 2013 Medtronic Sensa  . Chronic diastolic CHF (congestive heart failure)   . Hypertension   . Arthritis     "qwhere"    Past Surgical History  Procedure Laterality Date  . Hip arthroplasty Right   . Partial hysterectomy      vaginal  . Cholecystectomy    . Insert / replace /  remove pacemaker  11/2011  . Permanent pacemaker insertion N/A 12/09/2011    Procedure: PERMANENT PACEMAKER INSERTION;  Surgeon: Marinus Maw, MD;  Location: [facility] CATH LAB;  Service: Cardiovascular;  Laterality: N/A;    Family History  Problem Relation Age of Onset  . Cardiomyopathy Father   . Diabetes Father   . Heart failure Father   . Cancer Son   . Coronary artery disease Son   . Dementia Mother     Social History [name] reports that [name] has never smoked. [name] has never used smokeless tobacco. [name] reports that [name] does not drink alcohol.  Review of Systems Complete review of systems are found to be negative unless outlined in H&P above.  Physical  Examination Blood pressure 125/59, pulse 90, temperature 98.2 F (36.8 C), temperature source Oral, resp. rate 20, height 5' (1.524 m), weight 157 lb 8 oz (71.442 kg), SpO2 96 %.  Intake/Output Summary (Last 24 hours) at 08/13/15 0748 Last data filed at 08/12/15 2246  Gross per 24 hour  Intake      3 ml  Output      0 ml  Net      3 ml    Telemetry:  GEN: No acute distress HEENT: Conjunctiva and lids normal, oropharynx clear with moist mucosa. Neck: Supple, no elevated JVP or carotid bruits, no thyromegaly. Lungs: Clear to auscultation, nonlabored breathing at rest. Cardiac: Regular rate and rhythm, no S3 or significant systolic murmur, no pericardial rub. Abdomen: Soft, nontender, no hepatomegaly, bowel sounds present, no guarding or rebound. Extremities: No pitting edema, distal pulses 2+.Significant wrinkling with erythema and open sores which have some weeping noted.  Skin: Warm and dry. Musculoskeletal: No kyphosis. Very deconditioned  Neuropsychiatric: Alert and oriented x3, affect grossly appropriate.  Prior Cardiac Testing/Procedures 1.Echocardiogram 11/29/2010 Left ventricle: [name] cavity size was normal. There was mild  concentric hypertrophy. Systolic function was vigorous. [name]  estimated ejection fraction was in [name] range of 65% to 70%. Wall  motion was normal; there were no regional wall motion  abnormalities. - Mitral valve: Calcified annulus. - Left atrium: [name] atrium was mildly dilated. - Right ventricle: [name] cavity size was normal. Wall thickness was  mildly increased. - Atrial septum: No defect or patent foramen ovale was identified. - Pulmonary arteries: Systolic pressure was mildly increased. PA  peak pressure: 46mm Hg (S).  Pacemaker interrogation 01/13/2015 (Medtronic) Pacemaker check in clinic. Normal device function. Thresholds, sensing, impedances consistent with previous measurements. Device programmed to maximize longevity. 1,818 mode  switches-- longest 19:51, pk A 174, pk V 120-- available EGMs show pacemaker  wenkebach. 2 high ventricular rates noted-- longest 13 beats, pk A 92, pk V 248. UTR increased to 140bpm per GT. Device programmed at appropriate safety margins. Histogram distribution appropriate for [name] activity level. Device programmed to optimize  intrinsic conduction. Estimated longevity 5.5-8.5 years. Pt not interested in remote monitoring. (I don't understand anything about that." 6 months in device clinic, ROV with GT in 12 months. [name] education completed.  Lab Results  Basic Metabolic Panel:  Recent Labs Lab 08/12/15 2019 08/13/15 0415  NA 139 138  K 3.3* 3.3*  CL 99* 100*  CO2 32 27  GLUCOSE 149* 130*  BUN 15 16  CREATININE 0.78 0.78  CALCIUM 8.8* 8.8*    Liver Function Tests:  Recent Labs Lab 08/12/15 2019 08/13/15 0415  AST 28 28  ALT 14 11*  ALKPHOS 97 90  BILITOT 0.7 0.8  PROT 6.8  6.7  ALBUMIN 3.5 3.4*    CBC:  Recent Labs Lab 08/12/15 2019 08/13/15 0550  WBC 7.8 7.1  NEUTROABS 4.6  --   HGB 14.2 15.6*  HCT 42.4 47.5*  MCV 89.8 90.5  PLT 246 218    Cardiac Enzymes:  Recent Labs Lab 08/12/15 2019 08/12/15 2234 08/13/15 0415  TROPONINI 0.04* 0.04* 0.03    BNP: Invalid input(s): POCBNP   Radiology: Dg Chest 2 View  08/12/2015   CLINICAL DATA:  Chest pain and shortness of breath.  EXAM: CHEST  2 VIEW  COMPARISON:  04/30/2015 and 08/06/2014  FINDINGS: There is a focal area of abnormal density posterior medially at [name] left lung base with loss of [name] silhouette of [name] posterior aspect of [name] left hemidiaphragm. I suspect this represents a focal area of infiltrate or atelectasis.  There is chronic elevation of [name] right hemidiaphragm. Stable slight scarring at [name] right base.  Heart size and pulmonary vascularity are normal. Calcification in [name] thoracic aorta. No acute osseous abnormality. Severe arthritis of both shoulders.  IMPRESSION: Area of atelectasis  or infiltrate at [name] left base posterior medially.  Aortic atherosclerosis.   Electronically Signed   By: Francene Boyers M.D.   On: 08/12/2015 21:00     ECG: Tachycardia with LVH   Impression and Recommendations  1. Palpitations: [name] denies chest pain but complained of palpitations and diaphoresis. [name] was found to be hypokalemic on admission and has started potassium replacement this am. Troponin is negative X 3.. Check magnesium.  2. Chronic diastolic dysfunction: [name] is on lasix which [name] takes daily, 60 mg BID, with potassium replacement of 60 mEq daily. [name] denies medical non-compliance. [name] also remains on coreg 3.125 BID. Will. Increase to 6.25 mg BID as HR remains elevated.   3. Severe deconditioning: [name] states that [name] has not walked since May of this year due to pain and edema in her legs. [name] states her doctor told her to keep her feet elevated so [name] stays in [name] bed or on [name] couch to do so. [name] has refused PT facility admission, but this may need to be more aggressively pursued now that [name] is not having any more pain or edema in her legs.   4. Medtronic PPM in situ: Hasn't been checked since Feb. [name] is not agreeable to having her PPM check remotely, [name] states [name] does not understand how it will be completed. [name] lives alone and family stays with her at night only. I will have her PPM checked while here to evaluate her complaint of palpitations and review histogram.   Signed: Bettey Mare. Lawrence NP [facility]  08/13/2015, 7:48 AM Co-Sign MD  [name] seen and discussed with NP Lyman Bishop, I agree with her documentation. 81 yo female history of chronic diastolic HF and pacemaker admitted with chest pain and palpitations. [name] is somewhat of a limited historian, primarily in [name] chronology of her symptoms. To me [name] reports [name] was sleeping when [name] was awoken but a sudden fullness in her chest and SOB. Noted some palpitations as well. [name] is not able to tell me how long  [name] symptoms lasted. [name] was seen by a home health nurse and noted to be tachycardic at home [name] thinks in [name] low 100s.   CXR atelectasis vs infiltrate left base BNP 89, WBC 7.8, Hgb 14.2, Plt 246, K 3.3, Cr 0.78,  Trop 0.04 and flat (similar patten 04/2015) EKG A-sensed, V-paced Jan 2012 echo: LVEF 65-70%, no  WMAs, PASP 46  Sudden onset of chest pain and palpitations. [name] continues to have sinus tachycardia to 110-120s that is unexplained. My highest concern would be possible PE, especially given her limited mobility. EKG is paced, cannot evaluate for ischemia. Mild fairly flat troponin at 0.04, was mildly elevated during admit 3 months ago as well at 0.04, overall nonspecific findings. We will ask primary team to order CT PE. Will will obtain echo, and also have her pacemaker checked.   Dominga Ferry MD

## 2015-08-13 NOTE — Care Management Note (Signed)
Case Management Note  Patient Details  Name: [name] MRN: [id number] Date of Birth: [birth year]  Expected Discharge Date:    08/15/2015              Expected Discharge Plan:     In-House Referral:  Clinical Social Work  Discharge planning Services  CM Consult  Post Acute Care Choice:    Choice offered to:  NA  DME Arranged:    DME Agency:     HH Arranged:    HH Agency:     Status of Service:  In process, will continue to follow  Medicare Important Message Given:    Date Medicare IM Given:    Medicare IM give by:    Date Additional Medicare IM Given:    Additional Medicare Important Message give by:     If discussed at Long Length of Stay Meetings, dates discussed:    Additional Comments: Pt admitted for chest pain/tachycardia. Pt is from home, lives alone. Pt has 4 children (one daughter [name], who manages her finances, and 3 sons). [name] lives in [place]. Pt has [facility] for RN/PT services, [facility] made aware of admission and will be updated on discharge planning. Pt has aid 4 hours a day, 5 days a week and another "freind" who comes in the morning. The morning caregiver will not be able to come anymore after next week. Pt says she used to be able to walk and is now wheelchair bound but her wheelchair is too small and cuts into her legs because she has gained weight. Wheelchair is 79 years old and she will not be eligible for another wheelchair for 1 more year. [name] reports daughter has lost weight and since she is unable to stand they are unable to collect a weight on her at home. Pt has no home O2 prior to admission but is currently on a [place]. CM discussed with pt her plan for after discharge. Pt says she understands she can not go home with no around the clock help. Pt asked what her plan was for after her caregiver stops coming. Pt says she doesn't know, she needs to go somewhere for rehab. CM discussed what it means to be admitted under observation and how she would need a 3 day  inpt stay for medicare to pay for SNF. Pt verbalized understanding. CM discussed other options such as ALF vs long term SN vs going to live with family. Pt understandable to her options. Pt gave permission for CM to contact daughter, [name], or any of her sons. CM called [name] and explained that pt was observation and would not be eligible for SNF placement through medicare for rehab. [name] said she had already been in contact with SS and was told she needed an FL2, she has requested on from Dr. Renard Matter but hadn't received on yet. [name] says her mom could come stay with her but she is unable to care for her and she feels her mom needs to be placed, stating "there's no way she can go home". Pt's daughter understands placement process. Told daughter CM would discuss with CSW and have them contact her in the morning to discuss options further. PT eval placed to determine appropriate disposition, ALF vs SNF. Will cont to follow.     Malcolm Metro, RN 08/13/2015, 1:13 PM

## 2015-08-14 DIAGNOSIS — Z7401 Bed confinement status: Secondary | ICD-10-CM | POA: Diagnosis not present

## 2015-08-14 DIAGNOSIS — R0902 Hypoxemia: Secondary | ICD-10-CM | POA: Diagnosis not present

## 2015-08-14 DIAGNOSIS — R079 Chest pain, unspecified: Secondary | ICD-10-CM | POA: Diagnosis not present

## 2015-08-14 LAB — BASIC METABOLIC PANEL
ANION GAP: 8 (ref 5–15)
Anion gap: 11 (ref 5–15)
BUN: 21 mg/dL — ABNORMAL HIGH (ref 6–20)
BUN: 22 mg/dL — ABNORMAL HIGH (ref 6–20)
CHLORIDE: 97 mmol/L — AB (ref 101–111)
CHLORIDE: 99 mmol/L — AB (ref 101–111)
CO2: 26 mmol/L (ref 22–32)
CO2: 26 mmol/L (ref 22–32)
Calcium: 8.8 mg/dL — ABNORMAL LOW (ref 8.9–10.3)
Calcium: 8.4 mg/dL — ABNORMAL LOW (ref 8.9–10.3)
Creatinine, Ser: 1.02 mg/dL — ABNORMAL HIGH (ref 0.44–1.00)
Creatinine, Ser: 0.98 mg/dL (ref 0.44–1.00)
GFR calc Af Amer: 58 mL/min — ABNORMAL LOW (ref >60)
GFR calc non Af Amer: 50 mL/min — ABNORMAL LOW (ref >60)
GFR calc non Af Amer: 53 mL/min — ABNORMAL LOW (ref >60)
Glucose, Bld: 206 mg/dL — ABNORMAL HIGH (ref 65–99)
Glucose, Bld: 238 mg/dL — ABNORMAL HIGH (ref 65–99)
POTASSIUM: 3 mmol/L — AB (ref 3.5–5.1)
Potassium: 4.2 mmol/L (ref 3.5–5.1)
SODIUM: 134 mmol/L — AB (ref 135–145)
Sodium: 133 mmol/L — ABNORMAL LOW (ref 135–145)

## 2015-08-14 MED ORDER — POTASSIUM CHLORIDE 10 MEQ/100ML IV SOLN
10.0000 meq | INTRAVENOUS | Status: AC
  Administered 2015-08-14 (×3): 10 meq via INTRAVENOUS
  Filled 2015-08-14 (×3): qty 100

## 2015-08-14 MED ORDER — MAGNESIUM OXIDE 400 (241.3 MG) MG PO TABS: 400.0000 mg | ORAL_TABLET | Freq: Every day | ORAL | Status: DC

## 2015-08-14 MED ORDER — POTASSIUM CHLORIDE CRYS ER 20 MEQ PO TBCR
40.0000 meq | EXTENDED_RELEASE_TABLET | Freq: Every day | ORAL | Status: DC
Start: 2015-08-14 — End: 2016-01-06

## 2015-08-14 NOTE — Clinical Social Work Note (Signed)
Clinical Social Work Assessment  Patient Details  Name: [name] MRN: [id number] Date of Birth: [birth year]  Date of referral:  08/14/15               Reason for consult:  Facility Placement                Permission sought to share information with:  Family Supports Permission granted to share information::  Yes, Verbal Permission Granted  Name::     [facility]  Agency::     Relationship::  daughter  Contact Information:  [phone]  Housing/Transportation Living arrangements for the past 2 months:  [place] of Information:  Patient, Adult Children Patient Interpreter Needed:  None Criminal Activity/Legal Involvement Pertinent to Current Situation/Hospitalization:  No - Comment as needed Significant Relationships:  Adult Children Lives with:  Self Do you feel safe going back to the place where you live?  No Need for family participation in patient care:  Yes (Comment)  Care giving concerns:  Pt requiring hoyer lift and lives alone.    Social Worker assessment / plan:  CSW met with pt at bedside and spoke with pt's daughter, [name] on phone as she is out of town. Pt reports she has been living alone. Her son lives next door and checks in regularly, providing dinner and sometimes stays overnight. [name] handles most of pt's finances/paperwork. Pt has an aid from [employer] from 12-4 Monday through Friday and her neighbor comes over in the morning to assist with breakfast and a bath. However, her neighbor is not going to be available for this much longer. [name] is very concerned about pt's ability to remain at home alone. PT evaluated pt and she is currently requiring a hoyer lift for transfers. Recommend nursing home or around the clock assist at home. [name] is aware of this and also understands Medicare coverage/criteria. She has spoken with her brothers and they are willing to pay one month privately while applying for long term Medicaid. [name] has been in touch with  [facility] today regarding this. Pt willing to go to [facility] or [facility]. Referral faxed and both pt and daughter accept bed at [facility]. [name] has spoken with business office there regarding private pay. MD updated. Anticipate d/c tomorrow.   Employment status:  Retired Forensic scientist:  [employer] PT Recommendations:  [address] [place] / Referral to community resources:  [place]  Patient/Family's Response to care:  Pt reluctantly agreeable to placement and daughter reports that pt cannot return home on own.   Patient/Family's Understanding of and Emotional Response to Diagnosis, Current Treatment, and Prognosis:  Pt and family understand admission diagnosis and recommendation for nursing facility placement or around the clock care. Pt emotional as she shares she never wanted to go to a nursing home. CSW provided support.   Emotional Assessment Appearance:  Appears stated age Attitude/Demeanor/Rapport:  Other (emotional) Affect (typically observed):  Accepting, Tearful/Crying Orientation:  Oriented to Self, Oriented to Place, Oriented to  Time, Oriented to Situation Alcohol / Substance use:  Not Applicable Psych involvement (Current and /or in the community):  No (Comment)  Discharge Needs  Concerns to be addressed:  Discharge Planning Concerns Readmission within the last 30 days:  No Current discharge risk:  Lives alone Barriers to Discharge:  No Barriers Identified   Salome Arnt, [place] 08/14/2015, 1:34 PM [phone]

## 2015-08-14 NOTE — Care Management Note (Signed)
Case Management Note  Patient Details  Name: [name] MRN: [id number] Date of Birth: 30-Nov-1930  Expected Discharge Date:    08/14/2015               Expected Discharge Plan:  Skilled Nursing Facility  In-House Referral:  Clinical Social Work  Discharge planning Services  CM Consult  Post Acute Care Choice:    Choice offered to:  NA  DME Arranged:    DME Agency:     HH Arranged:    HH Agency:     Status of Service:  Completed, signed off  Medicare Important Message Given:    Date Medicare IM Given:    Medicare IM give by:    Date Additional Medicare IM Given:    Additional Medicare Important Message give by:     If discussed at Long Length of Stay Meetings, dates discussed:    Additional Comments: Pt will DC home later tonight and go to SNF tomorrow. Family will stay with pt tonight. CM has spoken with family and they verbalize understanding of DC plan. CSW also in communication with family and SNF. No further CM needs.  Malcolm Metro, RN 08/14/2015, 2:30 PM

## 2015-08-14 NOTE — Discharge Summary (Signed)
Physician Discharge Summary  [name] ZOX:[id number] DOB: 23-Sep-1931 DOA: 08/12/2015  PCP: Alice Reichert, MD  Admit date: 08/12/2015 Discharge date: 08/14/2015     Discharge Diagnoses:  1. Atypical chest pain improved 2. Diastolic congestive heart failure and pulmonary hypertension 3. Low serum potassium 4. Bronchopneumonia left lower lobe 5. Chronic degenerative arthritis of low back and hips 6.   Discharge Condition: Stable Disposition: Home  Diet recommendation: 2 g sodium diet  Filed Weights   08/13/15 0020 08/13/15 0541  Weight: 72.122 kg (159 lb) 71.442 kg (157 lb 8 oz)    History of present illness:  The patient was admitted to the hospital with the chief complaint being a intermittent chest pain and palpitations. The patient does have a prior history of congestive heart failure  came into the emergency room with shortness of breath and diaphoresis  Hospital Course:  EKG on admission showed sinus tachycardia and chest x-ray showed area of possible infiltrate left base thought to be early pneumonia the patient was alert and oriented examination of chest showed decreased breath sounds heart regular rhythm no murmurs. Patient was started on Levaquin and potassium supplement. She was continued on medications listed below. CT of the chest small left pleural effusion and cardiomegaly. The patient denies any further chest pain. Because of continued low serum potassium levels patient be started on intravenous potassium followed by repeat chemistries. The patient was seen by cardiology. They were concerned about the possibility of PE but patient had CT of chest which was negative for PE the patient's condition remained stable. She continued to improve. Arrangements were made by family and social services to have her placed in nursing facility. The patient does have a prior history of degenerative changes in lumbar spine and hips and his been unable to ambulate normally because of  this  Discharge Instructions The patient is to continue medications listed below. She is scheduled to be admitted to nursing facility .    Medication List    STOP taking these medications        Acetaminophen-Codeine 300-30 MG per tablet      TAKE these medications        carvedilol 3.125 MG tablet  Commonly known as:  COREG  Take 3.125 mg by mouth 2 (two) times daily with a meal.     cholecalciferol 1000 UNITS tablet  Commonly known as:  VITAMIN D  Take 1,000 Units by mouth daily.     cyclobenzaprine 5 MG tablet  Commonly known as:  FLEXERIL  Take 5 mg by mouth 2 (two) times daily as needed for muscle spasms.     furosemide 20 MG tablet  Commonly known as:  LASIX  Take 3 tablets (60 mg total) by mouth 2 (two) times daily.     gabapentin 300 MG capsule  Commonly known as:  NEURONTIN  Take 1 capsule (300 mg total) by mouth 3 (three) times daily.     magnesium oxide 400 (241.3 MG) MG tablet  Commonly known as:  MAG-OX  TAKE ONE TABLET BY MOUTH ONE TIME DAILY     metolazone 2.5 MG tablet  Commonly known as:  ZAROXOLYN  Take 1 tab daily as needed for daily weight of 172 lbs or greater     multivitamin with minerals Tabs tablet  Take 1 tablet by mouth daily.     nitroGLYCERIN 0.4 MG SL tablet  Commonly known as:  NITROSTAT  Place 0.4 mg under the tongue every 5 (five) minutes as  needed for chest pain.     potassium chloride SA 20 MEQ tablet  Commonly known as:  K-DUR,KLOR-CON  Take 2 tablets (40 mEq total) by mouth daily.       Allergies  Allergen Reactions  . Aspirin Rash and Other (See Comments)    GI distress  . Tramadol Other (See Comments)    GI distress    The results of significant diagnostics from this hospitalization (including imaging, microbiology, ancillary and laboratory) are listed below for reference.    Significant Diagnostic Studies: Dg Chest 2 View  08/12/2015   CLINICAL DATA:  Chest pain and shortness of breath.  EXAM: CHEST  2 VIEW   COMPARISON:  04/30/2015 and 08/06/2014  FINDINGS: There is a focal area of abnormal density posterior medially at the left lung base with loss of the silhouette of the posterior aspect of the left hemidiaphragm. I suspect this represents a focal area of infiltrate or atelectasis.  There is chronic elevation of the right hemidiaphragm. Stable slight scarring at the right base.  Heart size and pulmonary vascularity are normal. Calcification in the thoracic aorta. No acute osseous abnormality. Severe arthritis of both shoulders.  IMPRESSION: Area of atelectasis or infiltrate at the left base posterior medially.  Aortic atherosclerosis.   Electronically Signed   By: Francene Boyers M.D.   On: 08/12/2015 21:00   Ct Chest W Contrast  08/13/2015   CLINICAL DATA:  81 year old female with shortness of breath on exertion. Lower extremity pain. Recent increased shortness of breath for 2 days. Personal history of cardiac pacemaker.  EXAM: CT CHEST WITH CONTRAST  TECHNIQUE: Multidetector CT imaging of the chest was performed during intravenous contrast administration.  CONTRAST:  34mL OMNIPAQUE IOHEXOL 300 MG/ML  SOLN  COMPARISON:  Chest radiographs 08/12/2015 and earlier. Cervical spine CT 04/21/2014.  FINDINGS: Low lung volumes with atelectatic changes to the major airways and mild respiratory motion artifact. Major airways appear patent. Increased elevation of the right hemidiaphragm. There is a small layering left pleural effusion with adjacent compressive atelectasis of the left lower lobe. Mild additional atelectasis in the medial segment of the right middle lobe and also in the right lower lobe. No other confluent pulmonary opacity.  Left chest transvenous cardiac pacemaker type device. Cardiomegaly with calcified coronary artery atherosclerosis. No pericardial effusion. No mediastinal lymphadenopathy. No axillary lymphadenopathy. Subcentimeter left thyroid nodules, do not meet size criteria for ultrasound follow-up.  Calcified atherosclerosis of the aorta in the chest and visualized upper abdomen.  Surgically absent gallbladder. Negative visualized liver, spleen, pancreas (fatty atrophied) adrenal glands, kidneys, and bowel in the upper abdomen.  Advanced degenerative changes in the spine and at the shoulders. There is some superimposed scoliosis. No acute osseous abnormality identified.  IMPRESSION: 1. Small layering left pleural effusion with left greater than right pulmonary atelectasis. 2. No other acute findings in the chest. Cardiomegaly. Calcified aortic and coronary artery atherosclerosis.   Electronically Signed   By: Odessa Fleming M.D.   On: 08/13/2015 13:12    Microbiology: No results found for this or any previous visit (from the past 240 hour(s)).   Labs: Basic Metabolic Panel:  Recent Labs Lab 08/12/15 2019 08/13/15 0415 08/13/15 1130 08/14/15 0929  NA 139 138  --  134*  K 3.3* 3.3*  --  3.0*  CL 99* 100*  --  97*  CO2 32 27  --  26  GLUCOSE 149* 130*  --  206*  BUN 15 16  --  21*  CREATININE 0.78 0.78  --  1.02*  CALCIUM 8.8* 8.8*  --  8.8*  MG  --   --  1.8  --    Liver Function Tests:  Recent Labs Lab 08/12/15 2019 08/13/15 0415  AST 28 28  ALT 14 11*  ALKPHOS 97 90  BILITOT 0.7 0.8  PROT 6.8 6.7  ALBUMIN 3.5 3.4*   No results for input(s): LIPASE, AMYLASE in the last 168 hours. No results for input(s): AMMONIA in the last 168 hours. CBC:  Recent Labs Lab 08/12/15 2019 08/13/15 0550  WBC 7.8 7.1  NEUTROABS 4.6  --   HGB 14.2 15.6*  HCT 42.4 47.5*  MCV 89.8 90.5  PLT 246 218   Cardiac Enzymes:  Recent Labs Lab 08/12/15 2019 08/12/15 2234 08/13/15 0415 08/13/15 1130  TROPONINI 0.04* 0.04* 0.03 0.04*   BNP: BNP (last 3 results)  Recent Labs  04/30/15 0040 05/09/15 1900 08/12/15 2018  BNP 81.0 83.7 89.0    ProBNP (last 3 results) No results for input(s): PROBNP in the last 8760 hours.  CBG: No results for input(s): GLUCAP in the last 168  hours.  Active Problems:   Hypertension   Cardiac pacemaker   Chest pain   Time coordinating discharge: 45 minutes   Signed:  Butch Penny, MD 08/14/2015, 4:19 PM

## 2015-08-14 NOTE — Progress Notes (Signed)
Patient discharged, IV was removed with catheter intact. Vitals within normal range. Family made aware, patient left unit in stable condition via EMS.

## 2015-08-14 NOTE — Progress Notes (Signed)
Consulting cardiologist: Dina Rich MD Primary Cardiologist: Everette Rank MD  Cardiology Specific Problem List: 1. Diastolic CHF 2. PPM in situ-Medtronic 3. Hypertension   Subjective:   No complaints of pain or dyspnea. Got up with PT yesterday.    Objective:   Temp:  [98 F (36.7 C)-98.1 F (36.7 C)] 98 F (36.7 C) (09/23 0424) Pulse Rate:  [67-107] 89 (09/23 0424) Resp:  [20] 20 (09/23 0424) BP: (104-123)/(57-82) 123/82 mmHg (09/23 0424) SpO2:  [95 %-100 %] 100 % (09/23 0424) Last BM Date: 08/12/15  Filed Weights   08/13/15 0020 08/13/15 0541  Weight: 159 lb (72.122 kg) 157 lb 8 oz (71.442 kg)    Intake/Output Summary (Last 24 hours) at 08/14/15 0844 Last data filed at 08/14/15 0835  Gross per 24 hour  Intake   1440 ml  Output      0 ml  Net   1440 ml    Telemetry:  Sinus tach with ventricular sensing. Rates in the 90's.   Exam:  General: No acute distress.  HEENT: Conjunctiva and lids normal, oropharynx clear.  Lungs: Clear to auscultation, nonlabored.  Cardiac: No elevated JVP or bruits. RRR, no gallop or rub.   Abdomen: Normoactive bowel sounds, nontender, nondistended.  Extremities: No pitting edema, distal pulses full. Wounds on legs are dressed.   Neuropsychiatric: Alert and oriented x3, affect appropriate.   Lab Results:  Basic Metabolic Panel:  Recent Labs Lab 08/12/15 2019 08/13/15 0415 08/13/15 1130  NA 139 138  --   K 3.3* 3.3*  --   CL 99* 100*  --   CO2 32 27  --   GLUCOSE 149* 130*  --   BUN 15 16  --   CREATININE 0.78 0.78  --   CALCIUM 8.8* 8.8*  --   MG  --   --  1.8    Liver Function Tests:  Recent Labs Lab 08/12/15 2019 08/13/15 0415  AST 28 28  ALT 14 11*  ALKPHOS 97 90  BILITOT 0.7 0.8  PROT 6.8 6.7  ALBUMIN 3.5 3.4*    CBC:  Recent Labs Lab 08/12/15 2019 08/13/15 0550  WBC 7.8 7.1  HGB 14.2 15.6*  HCT 42.4 47.5*  MCV 89.8 90.5  PLT 246 218    Cardiac Enzymes:  Recent Labs Lab  08/12/15 2234 08/13/15 0415 08/13/15 1130  TROPONINI 0.04* 0.03 0.04*    Radiology: Dg Chest 2 View  08/12/2015   CLINICAL DATA:  Chest pain and shortness of breath.  EXAM: CHEST  2 VIEW  COMPARISON:  04/30/2015 and 08/06/2014  FINDINGS: There is a focal area of abnormal density posterior medially at the left lung base with loss of the silhouette of the posterior aspect of the left hemidiaphragm. I suspect this represents a focal area of infiltrate or atelectasis.  There is chronic elevation of the right hemidiaphragm. Stable slight scarring at the right base.  Heart size and pulmonary vascularity are normal. Calcification in the thoracic aorta. No acute osseous abnormality. Severe arthritis of both shoulders.  IMPRESSION: Area of atelectasis or infiltrate at the left base posterior medially.  Aortic atherosclerosis.   Electronically Signed   By: Francene Boyers M.D.   On: 08/12/2015 21:00   Ct Chest W Contrast  08/13/2015   CLINICAL DATA:  81 year old female with shortness of breath on exertion. Lower extremity pain. Recent increased shortness of breath for 2 days. Personal history of cardiac pacemaker.  EXAM: CT CHEST WITH CONTRAST  TECHNIQUE:  Multidetector CT imaging of the chest was performed during intravenous contrast administration.  CONTRAST:  75mL OMNIPAQUE IOHEXOL 300 MG/ML  SOLN  COMPARISON:  Chest radiographs 08/12/2015 and earlier. Cervical spine CT 04/21/2014.  FINDINGS: Low lung volumes with atelectatic changes to the major airways and mild respiratory motion artifact. Major airways appear patent. Increased elevation of the right hemidiaphragm. There is a small layering left pleural effusion with adjacent compressive atelectasis of the left lower lobe. Mild additional atelectasis in the medial segment of the right middle lobe and also in the right lower lobe. No other confluent pulmonary opacity.  Left chest transvenous cardiac pacemaker type device. Cardiomegaly with calcified coronary  artery atherosclerosis. No pericardial effusion. No mediastinal lymphadenopathy. No axillary lymphadenopathy. Subcentimeter left thyroid nodules, do not meet size criteria for ultrasound follow-up. Calcified atherosclerosis of the aorta in the chest and visualized upper abdomen.  Surgically absent gallbladder. Negative visualized liver, spleen, pancreas (fatty atrophied) adrenal glands, kidneys, and bowel in the upper abdomen.  Advanced degenerative changes in the spine and at the shoulders. There is some superimposed scoliosis. No acute osseous abnormality identified.  IMPRESSION: 1. Small layering left pleural effusion with left greater than right pulmonary atelectasis. 2. No other acute findings in the chest. Cardiomegaly. Calcified aortic and coronary artery atherosclerosis.   Electronically Signed   By: Odessa Fleming M.D.   On: 08/13/2015 13:12    Pacemaker Interrogation: (located in paper chart):  "Normal device function-I VHR form 07/24/2015 X 6 bts."    Medications:   Scheduled Medications: . antiseptic oral rinse  7 mL Mouth Rinse BID  . carvedilol  6.25 mg Oral BID WC  . cholecalciferol  1,000 Units Oral Daily  . enoxaparin (LOVENOX) injection  40 mg Subcutaneous Q24H  . furosemide  60 mg Oral BID  . gabapentin  300 mg Oral TID  . levofloxacin (LEVAQUIN) IV  500 mg Intravenous Q24H  . magnesium oxide  400 mg Oral Daily  . multivitamin with minerals  1 tablet Oral Daily  . potassium chloride SA  40 mEq Oral Daily  . sodium chloride  3 mL Intravenous Q12H      PRN Medications: acetaminophen-codeine, cyclobenzaprine, nitroGLYCERIN, ondansetron **OR** ondansetron (ZOFRAN) IV   Assessment and Plan:   1.Acute on Chronic Diastolic CHF: She appears compensated currently. She is positive on I/O but has no symptoms. Continue current does of lasix 60 mg BID. Will check BMET this am.  Mg 1.8. I will give supplement.   2. Tachycardia: I have increased coreg to 6.25 mg BID yesterday. CT negative  for PE. Should be ready to go home from cardiology standpoint.   3. PPM in situ-Medtronic: Interrogated yesterday and found to be functioning appropriately with on episode of HR on 07/24/2015 between 96 and 175 bpm.   4. Deconditioning: She is working with PT.   Bettey Mare. [place] NP [facility]  08/14/2015, 8:44 AM   Patient seen and discussed with NP Lyman Bishop, I agree with her documentation above. CT PE was negative, troponins nonspecific 0.04 initially and flat, a pattern she has had with prior admissions. Echo LVEF 65-70%, cannot evaluate diastolic function due to tachycardia, no significant findings. Pacemaker check with normal function. Has had some sinus tachycardia that is likely due to her pneumonia, abx per primary team, rates are improved today in the 90s. Fairly unremarkable cardiac workup, no plans for further testing at this time. Will sign off inpatient care.   Dominga Ferry MD

## 2015-08-14 NOTE — Clinical Social Work Note (Signed)
Pt stable for d/c today, but SNF unavailable until tomorrow. CSW received call that [facility] was now unable to offer bed. Pt's daughter notified and bed search initiated again. [facility] can offer bed with private pay while Medicaid application is pending. Pt and daughter aware and agreeable. Pt's son to stay with pt at home tonight and family will arrange transport from home to [facility].   Derenda Fennel, LCSW [phone]

## 2015-08-14 NOTE — Progress Notes (Signed)
Subjective: The patient is fairly comfortable this morning. She was admitted with chest pain and palpitations and had low serum potassium level and x-ray evidence of possible early pneumonia. She does have a prior history of diastolic congestive heart failure and chronic low back pain and hip pain secondary to arthritis and degenerative changes  Objective: V the patient is fairly comfortable as morning ital signs in last 24 hours: Temp:  [98 F (36.7 C)-98.1 F (36.7 C)] 98 F (36.7 C) (09/23 0424) Pulse Rate:  [67-107] 89 (09/23 0424) Resp:  [20] 20 (09/23 0424) BP: (104-123)/(57-82) 123/82 mmHg (09/23 0424) SpO2:  [95 %-100 %] 100 % (09/23 0424) Weight change:  Last BM Date: 08/12/15  Intake/Output from previous day: 09/22 0701 - 09/23 0700 In: 1080 [P.O.:1080] Out: -  Intake/Output this shift:    Physical Exam: Gen. appearance patient appears alert and comfortable  HEENT negative  Neck supple no JVD or thyroid abnormalities  Heart regular rhythm no murmurs  Lungs clear to P&A  Abdomen no palpable organs or masses  Skin warm and dry  Back paraspinous muscle tenderness over lower back   Recent Labs  08/12/15 2019 08/13/15 0550  WBC 7.8 7.1  HGB 14.2 15.6*  HCT 42.4 47.5*  PLT 246 218   BMET  Recent Labs  08/12/15 2019 08/13/15 0415  NA 139 138  K 3.3* 3.3*  CL 99* 100*  CO2 32 27  GLUCOSE 149* 130*  BUN 15 16  CREATININE 0.78 0.78  CALCIUM 8.8* 8.8*    Studies/Results: Dg Chest 2 View  08/12/2015   CLINICAL DATA:  Chest pain and shortness of breath.  EXAM: CHEST  2 VIEW  COMPARISON:  04/30/2015 and 08/06/2014  FINDINGS: There is a focal area of abnormal density posterior medially at the left lung base with loss of the silhouette of the posterior aspect of the left hemidiaphragm. I suspect this represents a focal area of infiltrate or atelectasis.  There is chronic elevation of the right hemidiaphragm. Stable slight scarring at the right base.   Heart size and pulmonary vascularity are normal. Calcification in the thoracic aorta. No acute osseous abnormality. Severe arthritis of both shoulders.  IMPRESSION: Area of atelectasis or infiltrate at the left base posterior medially.  Aortic atherosclerosis.   Electronically Signed   By: Francene Boyers M.D.   On: 08/12/2015 21:00   Ct Chest W Contrast  08/13/2015   CLINICAL DATA:  81 year old female with shortness of breath on exertion. Lower extremity pain. Recent increased shortness of breath for 2 days. Personal history of cardiac pacemaker.  EXAM: CT CHEST WITH CONTRAST  TECHNIQUE: Multidetector CT imaging of the chest was performed during intravenous contrast administration.  CONTRAST:  52mL OMNIPAQUE IOHEXOL 300 MG/ML  SOLN  COMPARISON:  Chest radiographs 08/12/2015 and earlier. Cervical spine CT 04/21/2014.  FINDINGS: Low lung volumes with atelectatic changes to the major airways and mild respiratory motion artifact. Major airways appear patent. Increased elevation of the right hemidiaphragm. There is a small layering left pleural effusion with adjacent compressive atelectasis of the left lower lobe. Mild additional atelectasis in the medial segment of the right middle lobe and also in the right lower lobe. No other confluent pulmonary opacity.  Left chest transvenous cardiac pacemaker type device. Cardiomegaly with calcified coronary artery atherosclerosis. No pericardial effusion. No mediastinal lymphadenopathy. No axillary lymphadenopathy. Subcentimeter left thyroid nodules, do not meet size criteria for ultrasound follow-up. Calcified atherosclerosis of the aorta in the chest and visualized upper abdomen.  Surgically absent gallbladder. Negative visualized liver, spleen, pancreas (fatty atrophied) adrenal glands, kidneys, and bowel in the upper abdomen.  Advanced degenerative changes in the spine and at the shoulders. There is some superimposed scoliosis. No acute osseous abnormality identified.   IMPRESSION: 1. Small layering left pleural effusion with left greater than right pulmonary atelectasis. 2. No other acute findings in the chest. Cardiomegaly. Calcified aortic and coronary artery atherosclerosis.   Electronically Signed   By: Odessa Fleming M.D.   On: 08/13/2015 13:12    Medications:  . antiseptic oral rinse  7 mL Mouth Rinse BID  . carvedilol  6.25 mg Oral BID WC  . cholecalciferol  1,000 Units Oral Daily  . enoxaparin (LOVENOX) injection  40 mg Subcutaneous Q24H  . furosemide  60 mg Oral BID  . gabapentin  300 mg Oral TID  . levofloxacin (LEVAQUIN) IV  500 mg Intravenous Q24H  . magnesium oxide  400 mg Oral Daily  . multivitamin with minerals  1 tablet Oral Daily  . potassium chloride SA  40 mEq Oral Daily  . sodium chloride  3 mL Intravenous Q12H        Assessment/Plan: 1. Atypical chest pain improved  2. Diastolic congestive heart failure to continue furosemide 60 mg twice day daily continue carvedilol 6.25 mg twice a day  3. Low serum potassium to replete potassium continue 40 mEq daily to repeat chemistries  4. Possible early pneumonia to continue IV Levaquin 500 mg daily    5. Chronic degenerative arthritis of low back and hips making it impossible for her to walk without assistance  MCINNIS,ANGUS G 08/14/2015, 6:24 AM

## 2015-08-14 NOTE — Clinical Social Work Placement (Signed)
   CLINICAL SOCIAL WORK PLACEMENT  NOTE  Date:  08/14/2015  Patient Details  Name: [name] MRN: [id number] Date of Birth: 1931-07-13  Clinical Social Work is seeking post-discharge placement for this patient at the Skilled  Nursing Facility level of care (*CSW will initial, date and re-position this form in  chart as items are completed):  Yes   Patient/family provided with [facility] Clinical Social Work Department's list of facilities offering this level of care within the geographic area requested by the patient (or if unable, by the patient's family).  Yes   Patient/family informed of their freedom to choose among providers that offer the needed level of care, that participate in Medicare, Medicaid or managed care program needed by the patient, have an available bed and are willing to accept the patient.  Yes   Patient/family informed of [facility]'s ownership interest in [facility] and [facility], as well as of the fact that they are under no obligation to receive care at these facilities.  PASRR submitted to EDS on       PASRR number received on       Existing PASRR number confirmed on 08/14/15     FL2 transmitted to all facilities in geographic area requested by pt/family on 08/14/15     FL2 transmitted to all facilities within larger geographic area on       Patient informed that his/her managed care company has contracts with or will negotiate with certain facilities, including the following:            Patient/family informed of bed offers received.  Patient chooses bed at       Physician recommends and patient chooses bed at      Patient to be transferred to   on  .  Patient to be transferred to facility by       Patient family notified on   of transfer.  Name of family member notified:        PHYSICIAN       Additional Comment:    _______________________________________________ Karn Cassis, LCSW 08/14/2015, 1:24  PM [phone]

## 2015-08-17 DIAGNOSIS — R278 Other lack of coordination: Secondary | ICD-10-CM | POA: Diagnosis not present

## 2015-08-17 DIAGNOSIS — I5030 Unspecified diastolic (congestive) heart failure: Secondary | ICD-10-CM | POA: Diagnosis not present

## 2015-08-17 DIAGNOSIS — R1319 Other dysphagia: Secondary | ICD-10-CM | POA: Diagnosis not present

## 2015-08-17 DIAGNOSIS — R41841 Cognitive communication deficit: Secondary | ICD-10-CM | POA: Diagnosis not present

## 2015-08-18 DIAGNOSIS — I5030 Unspecified diastolic (congestive) heart failure: Secondary | ICD-10-CM | POA: Diagnosis not present

## 2015-08-18 DIAGNOSIS — R41841 Cognitive communication deficit: Secondary | ICD-10-CM | POA: Diagnosis not present

## 2015-08-18 DIAGNOSIS — R278 Other lack of coordination: Secondary | ICD-10-CM | POA: Diagnosis not present

## 2015-08-18 DIAGNOSIS — R1319 Other dysphagia: Secondary | ICD-10-CM | POA: Diagnosis not present

## 2015-08-19 DIAGNOSIS — R41841 Cognitive communication deficit: Secondary | ICD-10-CM | POA: Diagnosis not present

## 2015-08-19 DIAGNOSIS — R1319 Other dysphagia: Secondary | ICD-10-CM | POA: Diagnosis not present

## 2015-08-19 DIAGNOSIS — I5030 Unspecified diastolic (congestive) heart failure: Secondary | ICD-10-CM | POA: Diagnosis not present

## 2015-08-19 DIAGNOSIS — R278 Other lack of coordination: Secondary | ICD-10-CM | POA: Diagnosis not present

## 2015-08-20 DIAGNOSIS — I5030 Unspecified diastolic (congestive) heart failure: Secondary | ICD-10-CM | POA: Diagnosis not present

## 2015-08-20 DIAGNOSIS — R41841 Cognitive communication deficit: Secondary | ICD-10-CM | POA: Diagnosis not present

## 2015-08-20 DIAGNOSIS — Z79899 Other long term (current) drug therapy: Secondary | ICD-10-CM | POA: Diagnosis not present

## 2015-08-20 DIAGNOSIS — R1319 Other dysphagia: Secondary | ICD-10-CM | POA: Diagnosis not present

## 2015-08-20 DIAGNOSIS — R278 Other lack of coordination: Secondary | ICD-10-CM | POA: Diagnosis not present

## 2015-08-21 ENCOUNTER — Telehealth: Payer: Self-pay

## 2015-08-21 DIAGNOSIS — I5030 Unspecified diastolic (congestive) heart failure: Secondary | ICD-10-CM | POA: Diagnosis not present

## 2015-08-21 DIAGNOSIS — R1319 Other dysphagia: Secondary | ICD-10-CM | POA: Diagnosis not present

## 2015-08-21 DIAGNOSIS — R278 Other lack of coordination: Secondary | ICD-10-CM | POA: Diagnosis not present

## 2015-08-21 DIAGNOSIS — R41841 Cognitive communication deficit: Secondary | ICD-10-CM | POA: Diagnosis not present

## 2015-08-21 NOTE — Telephone Encounter (Signed)
Pt's daughter called and stated that she wanted to talk to you about the fact that she has put her mother in a home. I told her that you were out of the office and she said she would like for you to call her whenever you get back in the office. She said she knows that Dr. Myrtis Ser is leaving and you won't be working with him anymore but that she wanted to talk to you. She said you can reach her between now and Friday at [phone], her name is [place].   Aggie Cosier

## 2015-08-21 NOTE — Telephone Encounter (Signed)
The pts daughter, [name], called to let us know that the pt is at [facility] facility in [place]. She does not know at this time if the pt will be there short or long term but she states that the pt continues to be unable to stand up on her own. She states that she will call us if the pts status changes.

## 2015-08-22 DIAGNOSIS — R278 Other lack of coordination: Secondary | ICD-10-CM | POA: Diagnosis not present

## 2015-08-22 DIAGNOSIS — I5030 Unspecified diastolic (congestive) heart failure: Secondary | ICD-10-CM | POA: Diagnosis not present

## 2015-08-24 DIAGNOSIS — I5030 Unspecified diastolic (congestive) heart failure: Secondary | ICD-10-CM | POA: Diagnosis not present

## 2015-08-24 DIAGNOSIS — R278 Other lack of coordination: Secondary | ICD-10-CM | POA: Diagnosis not present

## 2015-08-25 DIAGNOSIS — R278 Other lack of coordination: Secondary | ICD-10-CM | POA: Diagnosis not present

## 2015-08-25 DIAGNOSIS — I5030 Unspecified diastolic (congestive) heart failure: Secondary | ICD-10-CM | POA: Diagnosis not present

## 2015-08-26 ENCOUNTER — Other Ambulatory Visit: Payer: Self-pay | Admitting: Cardiology

## 2015-08-26 DIAGNOSIS — I5030 Unspecified diastolic (congestive) heart failure: Secondary | ICD-10-CM | POA: Diagnosis not present

## 2015-08-26 DIAGNOSIS — R278 Other lack of coordination: Secondary | ICD-10-CM | POA: Diagnosis not present

## 2015-08-27 DIAGNOSIS — R278 Other lack of coordination: Secondary | ICD-10-CM | POA: Diagnosis not present

## 2015-08-27 DIAGNOSIS — I5030 Unspecified diastolic (congestive) heart failure: Secondary | ICD-10-CM | POA: Diagnosis not present

## 2015-08-28 DIAGNOSIS — I5030 Unspecified diastolic (congestive) heart failure: Secondary | ICD-10-CM | POA: Diagnosis not present

## 2015-08-28 DIAGNOSIS — R278 Other lack of coordination: Secondary | ICD-10-CM | POA: Diagnosis not present

## 2015-08-30 DIAGNOSIS — I5030 Unspecified diastolic (congestive) heart failure: Secondary | ICD-10-CM | POA: Diagnosis not present

## 2015-08-30 DIAGNOSIS — R278 Other lack of coordination: Secondary | ICD-10-CM | POA: Diagnosis not present

## 2015-08-31 DIAGNOSIS — R278 Other lack of coordination: Secondary | ICD-10-CM | POA: Diagnosis not present

## 2015-08-31 DIAGNOSIS — I5030 Unspecified diastolic (congestive) heart failure: Secondary | ICD-10-CM | POA: Diagnosis not present

## 2015-09-01 DIAGNOSIS — L89600 Pressure ulcer of unspecified heel, unstageable: Secondary | ICD-10-CM | POA: Diagnosis not present

## 2015-09-01 DIAGNOSIS — R278 Other lack of coordination: Secondary | ICD-10-CM | POA: Diagnosis not present

## 2015-09-01 DIAGNOSIS — I5030 Unspecified diastolic (congestive) heart failure: Secondary | ICD-10-CM | POA: Diagnosis not present

## 2015-09-02 DIAGNOSIS — I5030 Unspecified diastolic (congestive) heart failure: Secondary | ICD-10-CM | POA: Diagnosis not present

## 2015-09-02 DIAGNOSIS — R278 Other lack of coordination: Secondary | ICD-10-CM | POA: Diagnosis not present

## 2015-09-03 DIAGNOSIS — R278 Other lack of coordination: Secondary | ICD-10-CM | POA: Diagnosis not present

## 2015-09-03 DIAGNOSIS — N200 Calculus of kidney: Secondary | ICD-10-CM | POA: Diagnosis not present

## 2015-09-03 DIAGNOSIS — I5030 Unspecified diastolic (congestive) heart failure: Secondary | ICD-10-CM | POA: Diagnosis not present

## 2015-09-03 DIAGNOSIS — E119 Type 2 diabetes mellitus without complications: Secondary | ICD-10-CM | POA: Diagnosis not present

## 2015-09-04 DIAGNOSIS — I5030 Unspecified diastolic (congestive) heart failure: Secondary | ICD-10-CM | POA: Diagnosis not present

## 2015-09-04 DIAGNOSIS — R278 Other lack of coordination: Secondary | ICD-10-CM | POA: Diagnosis not present

## 2015-09-05 DIAGNOSIS — R278 Other lack of coordination: Secondary | ICD-10-CM | POA: Diagnosis not present

## 2015-09-05 DIAGNOSIS — R531 Weakness: Secondary | ICD-10-CM | POA: Diagnosis not present

## 2015-09-05 DIAGNOSIS — I10 Essential (primary) hypertension: Secondary | ICD-10-CM | POA: Diagnosis not present

## 2015-09-05 DIAGNOSIS — I5030 Unspecified diastolic (congestive) heart failure: Secondary | ICD-10-CM | POA: Diagnosis not present

## 2015-09-05 DIAGNOSIS — M1990 Unspecified osteoarthritis, unspecified site: Secondary | ICD-10-CM | POA: Diagnosis not present

## 2015-09-07 DIAGNOSIS — I5030 Unspecified diastolic (congestive) heart failure: Secondary | ICD-10-CM | POA: Diagnosis not present

## 2015-09-07 DIAGNOSIS — R278 Other lack of coordination: Secondary | ICD-10-CM | POA: Diagnosis not present

## 2015-09-08 DIAGNOSIS — I5030 Unspecified diastolic (congestive) heart failure: Secondary | ICD-10-CM | POA: Diagnosis not present

## 2015-09-08 DIAGNOSIS — R278 Other lack of coordination: Secondary | ICD-10-CM | POA: Diagnosis not present

## 2015-09-09 DIAGNOSIS — R278 Other lack of coordination: Secondary | ICD-10-CM | POA: Diagnosis not present

## 2015-09-09 DIAGNOSIS — I5030 Unspecified diastolic (congestive) heart failure: Secondary | ICD-10-CM | POA: Diagnosis not present

## 2015-09-10 DIAGNOSIS — I5030 Unspecified diastolic (congestive) heart failure: Secondary | ICD-10-CM | POA: Diagnosis not present

## 2015-09-10 DIAGNOSIS — R278 Other lack of coordination: Secondary | ICD-10-CM | POA: Diagnosis not present

## 2015-09-10 DIAGNOSIS — E119 Type 2 diabetes mellitus without complications: Secondary | ICD-10-CM | POA: Diagnosis not present

## 2015-09-10 DIAGNOSIS — E559 Vitamin D deficiency, unspecified: Secondary | ICD-10-CM | POA: Diagnosis not present

## 2015-09-10 DIAGNOSIS — Z79899 Other long term (current) drug therapy: Secondary | ICD-10-CM | POA: Diagnosis not present

## 2015-09-11 DIAGNOSIS — I5030 Unspecified diastolic (congestive) heart failure: Secondary | ICD-10-CM | POA: Diagnosis not present

## 2015-09-11 DIAGNOSIS — R278 Other lack of coordination: Secondary | ICD-10-CM | POA: Diagnosis not present

## 2015-09-14 DIAGNOSIS — R278 Other lack of coordination: Secondary | ICD-10-CM | POA: Diagnosis not present

## 2015-09-14 DIAGNOSIS — I5030 Unspecified diastolic (congestive) heart failure: Secondary | ICD-10-CM | POA: Diagnosis not present

## 2015-09-15 DIAGNOSIS — I5030 Unspecified diastolic (congestive) heart failure: Secondary | ICD-10-CM | POA: Diagnosis not present

## 2015-09-15 DIAGNOSIS — R278 Other lack of coordination: Secondary | ICD-10-CM | POA: Diagnosis not present

## 2015-09-16 DIAGNOSIS — R278 Other lack of coordination: Secondary | ICD-10-CM | POA: Diagnosis not present

## 2015-09-16 DIAGNOSIS — Z79899 Other long term (current) drug therapy: Secondary | ICD-10-CM | POA: Diagnosis not present

## 2015-09-16 DIAGNOSIS — I5030 Unspecified diastolic (congestive) heart failure: Secondary | ICD-10-CM | POA: Diagnosis not present

## 2015-09-17 DIAGNOSIS — R278 Other lack of coordination: Secondary | ICD-10-CM | POA: Diagnosis not present

## 2015-09-17 DIAGNOSIS — I5030 Unspecified diastolic (congestive) heart failure: Secondary | ICD-10-CM | POA: Diagnosis not present

## 2015-09-18 DIAGNOSIS — I5030 Unspecified diastolic (congestive) heart failure: Secondary | ICD-10-CM | POA: Diagnosis not present

## 2015-09-18 DIAGNOSIS — R278 Other lack of coordination: Secondary | ICD-10-CM | POA: Diagnosis not present

## 2015-09-21 DIAGNOSIS — R278 Other lack of coordination: Secondary | ICD-10-CM | POA: Diagnosis not present

## 2015-09-21 DIAGNOSIS — I5030 Unspecified diastolic (congestive) heart failure: Secondary | ICD-10-CM | POA: Diagnosis not present

## 2015-09-22 DIAGNOSIS — I5030 Unspecified diastolic (congestive) heart failure: Secondary | ICD-10-CM | POA: Diagnosis not present

## 2015-09-22 DIAGNOSIS — R278 Other lack of coordination: Secondary | ICD-10-CM | POA: Diagnosis not present

## 2015-09-23 DIAGNOSIS — I5030 Unspecified diastolic (congestive) heart failure: Secondary | ICD-10-CM | POA: Diagnosis not present

## 2015-09-23 DIAGNOSIS — R278 Other lack of coordination: Secondary | ICD-10-CM | POA: Diagnosis not present

## 2015-09-24 DIAGNOSIS — I5030 Unspecified diastolic (congestive) heart failure: Secondary | ICD-10-CM | POA: Diagnosis not present

## 2015-09-24 DIAGNOSIS — R278 Other lack of coordination: Secondary | ICD-10-CM | POA: Diagnosis not present

## 2015-09-25 DIAGNOSIS — R278 Other lack of coordination: Secondary | ICD-10-CM | POA: Diagnosis not present

## 2015-09-25 DIAGNOSIS — I5030 Unspecified diastolic (congestive) heart failure: Secondary | ICD-10-CM | POA: Diagnosis not present

## 2015-09-26 DIAGNOSIS — I5030 Unspecified diastolic (congestive) heart failure: Secondary | ICD-10-CM | POA: Diagnosis not present

## 2015-09-26 DIAGNOSIS — R278 Other lack of coordination: Secondary | ICD-10-CM | POA: Diagnosis not present

## 2015-09-28 DIAGNOSIS — I5030 Unspecified diastolic (congestive) heart failure: Secondary | ICD-10-CM | POA: Diagnosis not present

## 2015-09-28 DIAGNOSIS — R278 Other lack of coordination: Secondary | ICD-10-CM | POA: Diagnosis not present

## 2015-10-15 DIAGNOSIS — I509 Heart failure, unspecified: Secondary | ICD-10-CM | POA: Diagnosis not present

## 2015-10-15 DIAGNOSIS — R531 Weakness: Secondary | ICD-10-CM | POA: Diagnosis not present

## 2015-10-15 DIAGNOSIS — E119 Type 2 diabetes mellitus without complications: Secondary | ICD-10-CM | POA: Diagnosis not present

## 2015-10-15 DIAGNOSIS — M109 Gout, unspecified: Secondary | ICD-10-CM | POA: Diagnosis not present

## 2015-10-21 DIAGNOSIS — G894 Chronic pain syndrome: Secondary | ICD-10-CM | POA: Diagnosis not present

## 2015-10-21 DIAGNOSIS — M1000 Idiopathic gout, unspecified site: Secondary | ICD-10-CM | POA: Diagnosis not present

## 2015-10-21 DIAGNOSIS — G8929 Other chronic pain: Secondary | ICD-10-CM | POA: Diagnosis not present

## 2015-10-21 DIAGNOSIS — M545 Low back pain: Secondary | ICD-10-CM | POA: Diagnosis not present

## 2015-10-27 ENCOUNTER — Emergency Department (HOSPITAL_COMMUNITY)
Admission: EM | Admit: 2015-10-27 | Discharge: 2015-10-27 | Disposition: A | Discharge status: Home / Self Care | Payer: Medicare Other | Attending: Emergency Medicine | Admitting: Emergency Medicine

## 2015-10-27 ENCOUNTER — Encounter (HOSPITAL_COMMUNITY): Payer: Self-pay | Admitting: *Deleted

## 2015-10-27 ENCOUNTER — Emergency Department (HOSPITAL_COMMUNITY): Payer: Medicare Other

## 2015-10-27 DIAGNOSIS — M79605 Pain in left leg: Secondary | ICD-10-CM | POA: Diagnosis not present

## 2015-10-27 DIAGNOSIS — R52 Pain, unspecified: Secondary | ICD-10-CM | POA: Diagnosis not present

## 2015-10-27 DIAGNOSIS — R001 Bradycardia, unspecified: Secondary | ICD-10-CM | POA: Diagnosis not present

## 2015-10-27 DIAGNOSIS — Z872 Personal history of diseases of the skin and subcutaneous tissue: Secondary | ICD-10-CM | POA: Diagnosis not present

## 2015-10-27 DIAGNOSIS — M25552 Pain in left hip: Secondary | ICD-10-CM | POA: Diagnosis not present

## 2015-10-27 DIAGNOSIS — I509 Heart failure, unspecified: Secondary | ICD-10-CM | POA: Insufficient documentation

## 2015-10-27 DIAGNOSIS — R279 Unspecified lack of coordination: Secondary | ICD-10-CM | POA: Diagnosis not present

## 2015-10-27 DIAGNOSIS — R011 Cardiac murmur, unspecified: Secondary | ICD-10-CM | POA: Insufficient documentation

## 2015-10-27 DIAGNOSIS — M1990 Unspecified osteoarthritis, unspecified site: Secondary | ICD-10-CM | POA: Diagnosis not present

## 2015-10-27 DIAGNOSIS — Z950 Presence of cardiac pacemaker: Secondary | ICD-10-CM | POA: Insufficient documentation

## 2015-10-27 DIAGNOSIS — Z743 Need for continuous supervision: Secondary | ICD-10-CM | POA: Diagnosis not present

## 2015-10-27 DIAGNOSIS — Z79899 Other long term (current) drug therapy: Secondary | ICD-10-CM | POA: Insufficient documentation

## 2015-10-27 DIAGNOSIS — R531 Weakness: Secondary | ICD-10-CM | POA: Diagnosis not present

## 2015-10-27 DIAGNOSIS — I10 Essential (primary) hypertension: Secondary | ICD-10-CM | POA: Diagnosis not present

## 2015-10-27 LAB — CBG MONITORING, ED: GLUCOSE-CAPILLARY: 100 mg/dL — AB (ref 65–99)

## 2015-10-27 MED ORDER — OXYCODONE-ACETAMINOPHEN 5-325 MG PO TABS
1.0000 | ORAL_TABLET | Freq: Once | ORAL | Status: AC
  Administered 2015-10-27: 1 via ORAL
  Filled 2015-10-27: qty 1

## 2015-10-27 MED ORDER — HYDROCODONE-ACETAMINOPHEN 5-325 MG PO TABS: 1.0000 | ORAL_TABLET | Freq: Four times a day (QID) | ORAL | Status: DC | PRN

## 2015-10-27 NOTE — ED Provider Notes (Signed)
CSN: [id number]     Arrival date & time 10/27/15  2109 History  By signing my name below, I, Bethel Born, attest that this documentation has been prepared under the direction and in the presence of Bethann Berkshire, MD. Electronically Signed: Bethel Born, ED Scribe. 10/27/2015. 9:28 PM   Chief Complaint  Patient presents with  . Leg Pain    Patient is a 81 y.o. female presenting with leg pain. The history is provided by the patient. No language interpreter was used.  Leg Pain Location:  Hip Injury: no   Hip location:  L hip Pain details:    Quality: "spasming"   Radiates to:  L leg   Severity:  Moderate   Onset quality:  Gradual   Duration:  4 days   Timing:  Constant   Progression:  Unchanged Chronicity:  Recurrent Relieved by:  Nothing Worsened by:  Activity Ineffective treatments:  None tried Associated symptoms: no back pain and no fatigue    Brought in by EMS from [facility], ERNESTA TRABERT is a 81 y.o. female with history of arthritis who presents to the Emergency Department complaining of constant, spasming, left leg pain with onset 4 days ago. The pain starts at the hip and radiates down the leg. Certain movements exacerbate the pain.  Pt declined Tylenol at her nursing facility because she took "2 Tylenol this morning" and did not want to be drowsy when she spoke to her sons.   Past Medical History  Diagnosis Date  . Edema     . EF 65-70%, echo, November 29, 2010  . Pulmonary hypertension (HCC)      46 mmHg... echo... January, 2012  . Cellulitis     Superficial cellulitis right lower leg   January 14, 2011  . AV block      2:1  January 14, 2011 / pacemaker plan when cellulitis resolved in length  . Bradycardia     2:1  AV block  . Hypokalemia     October, 2012  . Ejection fraction     EF 65%, echo, January, 2012  . Murmur     No significant valvular disease by echo, January, 2012  . Pacemaker     January, 2013 Med[employer]onic Sensa  .  Chronic diastolic CHF (congestive heart failure) (HCC)   . Hypertension   . Arthritis     "qwhere"   Past Surgical History  Procedure Laterality Date  . Hip arthroplasty Right   . Partial hysterectomy      vaginal  . Cholecystectomy    . Insert / replace / remove pacemaker  11/2011  . Permanent pacemaker insertion N/A 12/09/2011    Procedure: PERMANENT PACEMAKER INSERTION;  Surgeon: Marinus Maw, MD;  Location: [facility] CATH LAB;  Service: Cardiovascular;  Laterality: N/A;   Family History  Problem Relation Age of Onset  . Cardiomyopathy Father   . Diabetes Father   . Heart failure Father   . Cancer Son   . Coronary artery disease Son   . Dementia Mother    Social History  Substance Use Topics  . Smoking status: Never Smoker   . Smokeless tobacco: Never Used  . Alcohol Use: No   OB History    No data available     Review of Systems  Constitutional: Negative for appetite change and fatigue.  HENT: Negative for congestion, ear discharge and sinus pressure.   Eyes: Negative for discharge.  Respiratory: Negative for cough.   Cardiovascular:  Negative for chest pain.  Gastrointestinal: Negative for abdominal pain and diarrhea.  Genitourinary: Negative for frequency and hematuria.  Musculoskeletal: Positive for arthralgias (left hip pain). Negative for back pain.  Skin: Negative for rash.  Neurological: Negative for seizures and headaches.  Psychiatric/Behavioral: Negative for hallucinations.    Allergies  Aspirin and Tramadol  Home Medications   Prior to Admission medications   Medication Sig Start Date End Date Taking? Authorizing Provider  carvedilol (COREG) 3.125 MG tablet Take 3.125 mg by mouth 2 (two) times daily with a meal.    Historical Provider, MD  cholecalciferol (VITAMIN D) 1000 UNITS tablet Take 1,000 Units by mouth daily.    Historical Provider, MD  cyclobenzaprine (FLEXERIL) 5 MG tablet Take 5 mg by mouth 2 (two) times daily as needed for muscle spasms.      Historical Provider, MD  furosemide (LASIX) 20 MG tablet Take 3 tablets (60 mg total) by mouth 2 (two) times daily. 01/19/15   Luis Abed, MD  gabapentin (NEURONTIN) 300 MG capsule Take 1 capsule (300 mg total) by mouth 3 (three) times daily. 05/09/15   Eyvonne Mechanic, PA-C  magnesium oxide (MAG-OX) 400 (241.3 MG) MG tablet TAKE ONE TABLET BY MOUTH ONE TIME DAILY 04/02/15   Luis Abed, MD  metolazone (ZAROXOLYN) 2.5 MG tablet TAKE 1 TAB DAILY AS NEEDED FOR DAILY WEIGHT OF 172 LBS OR GREATER 08/27/15   Luis Abed, MD  Multiple Vitamin (MULTIVITAMIN WITH MINERALS) TABS tablet Take 1 tablet by mouth daily.    Historical Provider, MD  nitroGLYCERIN (NITROSTAT) 0.4 MG SL tablet Place 0.4 mg under the tongue every 5 (five) minutes as needed for chest pain.    Historical Provider, MD  potassium chloride SA (K-DUR,KLOR-CON) 20 MEQ tablet Take 2 tablets (40 mEq total) by mouth daily. 08/14/15   Angus McInnis, MD   BP 132/66 mmHg  Pulse 105  Temp(Src) 98.2 F (36.8 C) (Oral)  Resp 18 Physical Exam  Constitutional: She is oriented to person, place, and time. She appears well-developed.  HENT:  Head: Normocephalic.  Eyes: Conjunctivae are normal.  Neck: No tracheal deviation present.  Cardiovascular: Intact distal pulses.   No murmur heard. Musculoskeletal: Normal range of motion.  Moderate swelling from the knee distally Moderate tenderness at the left hip. Distal pulses intact.   Neurological: She is oriented to person, place, and time.  Skin: Skin is warm.  Psychiatric: She has a normal mood and affect.    ED Course  Procedures (including critical care time)  DIAGNOSTIC STUDIES:   COORDINATION OF CARE: 9:24 PM Discussed treatment plan which includes left hip XR and pain management with pt at bedside and pt agreed to plan.  Labs Review Labs Reviewed - No data to display  Imaging Review No results found. I have personally reviewed and evaluated these images and lab results as  part of my medical decision-making.   EKG Interpretation None      MDM   Final diagnoses:  None    Patient with severe arthritis in her left hip.  The arthritis is very old. She was given Vicodin and is to follow-up with PCP  The chart was scribed for me under my direct supervision.  I personally performed the history, physical, and medical decision making and all procedures in the evaluation of this patient.Bethann Berkshire, MD 10/27/15 [phone]

## 2015-10-27 NOTE — ED Notes (Signed)
Pt with left leg pain, refused to take Tylenol #3 today.  Pt from [facility]

## 2015-10-27 NOTE — Discharge Instructions (Signed)
Follow up with your md as needed °

## 2015-10-29 DIAGNOSIS — R278 Other lack of coordination: Secondary | ICD-10-CM | POA: Diagnosis not present

## 2015-10-29 DIAGNOSIS — M1990 Unspecified osteoarthritis, unspecified site: Secondary | ICD-10-CM | POA: Diagnosis not present

## 2015-10-29 DIAGNOSIS — R293 Abnormal posture: Secondary | ICD-10-CM | POA: Diagnosis not present

## 2015-10-30 DIAGNOSIS — R293 Abnormal posture: Secondary | ICD-10-CM | POA: Diagnosis not present

## 2015-10-30 DIAGNOSIS — M1990 Unspecified osteoarthritis, unspecified site: Secondary | ICD-10-CM | POA: Diagnosis not present

## 2015-10-30 DIAGNOSIS — R278 Other lack of coordination: Secondary | ICD-10-CM | POA: Diagnosis not present

## 2015-11-01 DIAGNOSIS — R278 Other lack of coordination: Secondary | ICD-10-CM | POA: Diagnosis not present

## 2015-11-01 DIAGNOSIS — M1990 Unspecified osteoarthritis, unspecified site: Secondary | ICD-10-CM | POA: Diagnosis not present

## 2015-11-01 DIAGNOSIS — R293 Abnormal posture: Secondary | ICD-10-CM | POA: Diagnosis not present

## 2015-11-02 DIAGNOSIS — M1990 Unspecified osteoarthritis, unspecified site: Secondary | ICD-10-CM | POA: Diagnosis not present

## 2015-11-02 DIAGNOSIS — R293 Abnormal posture: Secondary | ICD-10-CM | POA: Diagnosis not present

## 2015-11-02 DIAGNOSIS — R278 Other lack of coordination: Secondary | ICD-10-CM | POA: Diagnosis not present

## 2015-11-03 DIAGNOSIS — R278 Other lack of coordination: Secondary | ICD-10-CM | POA: Diagnosis not present

## 2015-11-03 DIAGNOSIS — M1990 Unspecified osteoarthritis, unspecified site: Secondary | ICD-10-CM | POA: Diagnosis not present

## 2015-11-03 DIAGNOSIS — R293 Abnormal posture: Secondary | ICD-10-CM | POA: Diagnosis not present

## 2015-11-04 DIAGNOSIS — M1990 Unspecified osteoarthritis, unspecified site: Secondary | ICD-10-CM | POA: Diagnosis not present

## 2015-11-04 DIAGNOSIS — R278 Other lack of coordination: Secondary | ICD-10-CM | POA: Diagnosis not present

## 2015-11-04 DIAGNOSIS — R293 Abnormal posture: Secondary | ICD-10-CM | POA: Diagnosis not present

## 2015-11-05 DIAGNOSIS — M1990 Unspecified osteoarthritis, unspecified site: Secondary | ICD-10-CM | POA: Diagnosis not present

## 2015-11-05 DIAGNOSIS — R278 Other lack of coordination: Secondary | ICD-10-CM | POA: Diagnosis not present

## 2015-11-05 DIAGNOSIS — R293 Abnormal posture: Secondary | ICD-10-CM | POA: Diagnosis not present

## 2015-11-06 DIAGNOSIS — M1990 Unspecified osteoarthritis, unspecified site: Secondary | ICD-10-CM | POA: Diagnosis not present

## 2015-11-06 DIAGNOSIS — R293 Abnormal posture: Secondary | ICD-10-CM | POA: Diagnosis not present

## 2015-11-06 DIAGNOSIS — R278 Other lack of coordination: Secondary | ICD-10-CM | POA: Diagnosis not present

## 2015-11-07 DIAGNOSIS — M1990 Unspecified osteoarthritis, unspecified site: Secondary | ICD-10-CM | POA: Diagnosis not present

## 2015-11-07 DIAGNOSIS — R278 Other lack of coordination: Secondary | ICD-10-CM | POA: Diagnosis not present

## 2015-11-07 DIAGNOSIS — R293 Abnormal posture: Secondary | ICD-10-CM | POA: Diagnosis not present

## 2015-11-08 DIAGNOSIS — M1990 Unspecified osteoarthritis, unspecified site: Secondary | ICD-10-CM | POA: Diagnosis not present

## 2015-11-08 DIAGNOSIS — R278 Other lack of coordination: Secondary | ICD-10-CM | POA: Diagnosis not present

## 2015-11-08 DIAGNOSIS — R293 Abnormal posture: Secondary | ICD-10-CM | POA: Diagnosis not present

## 2015-11-09 DIAGNOSIS — R278 Other lack of coordination: Secondary | ICD-10-CM | POA: Diagnosis not present

## 2015-11-09 DIAGNOSIS — M1990 Unspecified osteoarthritis, unspecified site: Secondary | ICD-10-CM | POA: Diagnosis not present

## 2015-11-09 DIAGNOSIS — R293 Abnormal posture: Secondary | ICD-10-CM | POA: Diagnosis not present

## 2015-11-10 DIAGNOSIS — R293 Abnormal posture: Secondary | ICD-10-CM | POA: Diagnosis not present

## 2015-11-10 DIAGNOSIS — M1990 Unspecified osteoarthritis, unspecified site: Secondary | ICD-10-CM | POA: Diagnosis not present

## 2015-11-10 DIAGNOSIS — R278 Other lack of coordination: Secondary | ICD-10-CM | POA: Diagnosis not present

## 2015-11-11 DIAGNOSIS — R278 Other lack of coordination: Secondary | ICD-10-CM | POA: Diagnosis not present

## 2015-11-11 DIAGNOSIS — R293 Abnormal posture: Secondary | ICD-10-CM | POA: Diagnosis not present

## 2015-11-11 DIAGNOSIS — M1990 Unspecified osteoarthritis, unspecified site: Secondary | ICD-10-CM | POA: Diagnosis not present

## 2015-11-12 DIAGNOSIS — R278 Other lack of coordination: Secondary | ICD-10-CM | POA: Diagnosis not present

## 2015-11-12 DIAGNOSIS — M1990 Unspecified osteoarthritis, unspecified site: Secondary | ICD-10-CM | POA: Diagnosis not present

## 2015-11-12 DIAGNOSIS — R293 Abnormal posture: Secondary | ICD-10-CM | POA: Diagnosis not present

## 2015-11-13 DIAGNOSIS — R293 Abnormal posture: Secondary | ICD-10-CM | POA: Diagnosis not present

## 2015-11-13 DIAGNOSIS — M1990 Unspecified osteoarthritis, unspecified site: Secondary | ICD-10-CM | POA: Diagnosis not present

## 2015-11-13 DIAGNOSIS — R278 Other lack of coordination: Secondary | ICD-10-CM | POA: Diagnosis not present

## 2015-11-14 DIAGNOSIS — M1990 Unspecified osteoarthritis, unspecified site: Secondary | ICD-10-CM | POA: Diagnosis not present

## 2015-11-14 DIAGNOSIS — R293 Abnormal posture: Secondary | ICD-10-CM | POA: Diagnosis not present

## 2015-11-14 DIAGNOSIS — R278 Other lack of coordination: Secondary | ICD-10-CM | POA: Diagnosis not present

## 2015-11-16 DIAGNOSIS — M1990 Unspecified osteoarthritis, unspecified site: Secondary | ICD-10-CM | POA: Diagnosis not present

## 2015-11-16 DIAGNOSIS — R293 Abnormal posture: Secondary | ICD-10-CM | POA: Diagnosis not present

## 2015-11-16 DIAGNOSIS — R278 Other lack of coordination: Secondary | ICD-10-CM | POA: Diagnosis not present

## 2015-11-17 DIAGNOSIS — Z79899 Other long term (current) drug therapy: Secondary | ICD-10-CM | POA: Diagnosis not present

## 2015-11-17 DIAGNOSIS — M1990 Unspecified osteoarthritis, unspecified site: Secondary | ICD-10-CM | POA: Diagnosis not present

## 2015-11-17 DIAGNOSIS — R293 Abnormal posture: Secondary | ICD-10-CM | POA: Diagnosis not present

## 2015-11-17 DIAGNOSIS — R278 Other lack of coordination: Secondary | ICD-10-CM | POA: Diagnosis not present

## 2015-11-18 DIAGNOSIS — R278 Other lack of coordination: Secondary | ICD-10-CM | POA: Diagnosis not present

## 2015-11-18 DIAGNOSIS — M1990 Unspecified osteoarthritis, unspecified site: Secondary | ICD-10-CM | POA: Diagnosis not present

## 2015-11-18 DIAGNOSIS — R293 Abnormal posture: Secondary | ICD-10-CM | POA: Diagnosis not present

## 2015-11-19 DIAGNOSIS — R293 Abnormal posture: Secondary | ICD-10-CM | POA: Diagnosis not present

## 2015-11-19 DIAGNOSIS — G894 Chronic pain syndrome: Secondary | ICD-10-CM | POA: Diagnosis not present

## 2015-11-19 DIAGNOSIS — M545 Low back pain: Secondary | ICD-10-CM | POA: Diagnosis not present

## 2015-11-19 DIAGNOSIS — M1990 Unspecified osteoarthritis, unspecified site: Secondary | ICD-10-CM | POA: Diagnosis not present

## 2015-11-19 DIAGNOSIS — R278 Other lack of coordination: Secondary | ICD-10-CM | POA: Diagnosis not present

## 2015-11-19 DIAGNOSIS — G8929 Other chronic pain: Secondary | ICD-10-CM | POA: Diagnosis not present

## 2015-11-19 DIAGNOSIS — M1000 Idiopathic gout, unspecified site: Secondary | ICD-10-CM | POA: Diagnosis not present

## 2015-11-20 DIAGNOSIS — R278 Other lack of coordination: Secondary | ICD-10-CM | POA: Diagnosis not present

## 2015-11-20 DIAGNOSIS — R293 Abnormal posture: Secondary | ICD-10-CM | POA: Diagnosis not present

## 2015-11-20 DIAGNOSIS — M1990 Unspecified osteoarthritis, unspecified site: Secondary | ICD-10-CM | POA: Diagnosis not present

## 2015-11-21 DIAGNOSIS — R293 Abnormal posture: Secondary | ICD-10-CM | POA: Diagnosis not present

## 2015-11-21 DIAGNOSIS — R278 Other lack of coordination: Secondary | ICD-10-CM | POA: Diagnosis not present

## 2015-11-21 DIAGNOSIS — M1990 Unspecified osteoarthritis, unspecified site: Secondary | ICD-10-CM | POA: Diagnosis not present

## 2015-11-23 DIAGNOSIS — M1990 Unspecified osteoarthritis, unspecified site: Secondary | ICD-10-CM | POA: Diagnosis not present

## 2015-11-23 DIAGNOSIS — R293 Abnormal posture: Secondary | ICD-10-CM | POA: Diagnosis not present

## 2015-11-23 DIAGNOSIS — R278 Other lack of coordination: Secondary | ICD-10-CM | POA: Diagnosis not present

## 2015-11-24 DIAGNOSIS — M1990 Unspecified osteoarthritis, unspecified site: Secondary | ICD-10-CM | POA: Diagnosis not present

## 2015-11-24 DIAGNOSIS — R278 Other lack of coordination: Secondary | ICD-10-CM | POA: Diagnosis not present

## 2015-11-24 DIAGNOSIS — R293 Abnormal posture: Secondary | ICD-10-CM | POA: Diagnosis not present

## 2015-11-25 DIAGNOSIS — M1990 Unspecified osteoarthritis, unspecified site: Secondary | ICD-10-CM | POA: Diagnosis not present

## 2015-11-25 DIAGNOSIS — R278 Other lack of coordination: Secondary | ICD-10-CM | POA: Diagnosis not present

## 2015-11-25 DIAGNOSIS — R293 Abnormal posture: Secondary | ICD-10-CM | POA: Diagnosis not present

## 2015-11-26 DIAGNOSIS — R278 Other lack of coordination: Secondary | ICD-10-CM | POA: Diagnosis not present

## 2015-11-26 DIAGNOSIS — R293 Abnormal posture: Secondary | ICD-10-CM | POA: Diagnosis not present

## 2015-11-26 DIAGNOSIS — M1990 Unspecified osteoarthritis, unspecified site: Secondary | ICD-10-CM | POA: Diagnosis not present

## 2015-11-27 DIAGNOSIS — M1990 Unspecified osteoarthritis, unspecified site: Secondary | ICD-10-CM | POA: Diagnosis not present

## 2015-11-27 DIAGNOSIS — R293 Abnormal posture: Secondary | ICD-10-CM | POA: Diagnosis not present

## 2015-11-27 DIAGNOSIS — R278 Other lack of coordination: Secondary | ICD-10-CM | POA: Diagnosis not present

## 2015-11-29 DIAGNOSIS — R293 Abnormal posture: Secondary | ICD-10-CM | POA: Diagnosis not present

## 2015-11-29 DIAGNOSIS — R278 Other lack of coordination: Secondary | ICD-10-CM | POA: Diagnosis not present

## 2015-11-29 DIAGNOSIS — M1990 Unspecified osteoarthritis, unspecified site: Secondary | ICD-10-CM | POA: Diagnosis not present

## 2015-11-30 DIAGNOSIS — M1990 Unspecified osteoarthritis, unspecified site: Secondary | ICD-10-CM | POA: Diagnosis not present

## 2015-11-30 DIAGNOSIS — R293 Abnormal posture: Secondary | ICD-10-CM | POA: Diagnosis not present

## 2015-11-30 DIAGNOSIS — R278 Other lack of coordination: Secondary | ICD-10-CM | POA: Diagnosis not present

## 2015-12-01 DIAGNOSIS — R278 Other lack of coordination: Secondary | ICD-10-CM | POA: Diagnosis not present

## 2015-12-01 DIAGNOSIS — R293 Abnormal posture: Secondary | ICD-10-CM | POA: Diagnosis not present

## 2015-12-01 DIAGNOSIS — M1990 Unspecified osteoarthritis, unspecified site: Secondary | ICD-10-CM | POA: Diagnosis not present

## 2015-12-02 DIAGNOSIS — R278 Other lack of coordination: Secondary | ICD-10-CM | POA: Diagnosis not present

## 2015-12-02 DIAGNOSIS — R293 Abnormal posture: Secondary | ICD-10-CM | POA: Diagnosis not present

## 2015-12-02 DIAGNOSIS — M1990 Unspecified osteoarthritis, unspecified site: Secondary | ICD-10-CM | POA: Diagnosis not present

## 2015-12-03 DIAGNOSIS — E119 Type 2 diabetes mellitus without complications: Secondary | ICD-10-CM | POA: Diagnosis not present

## 2015-12-03 DIAGNOSIS — Z79899 Other long term (current) drug therapy: Secondary | ICD-10-CM | POA: Diagnosis not present

## 2015-12-03 DIAGNOSIS — R278 Other lack of coordination: Secondary | ICD-10-CM | POA: Diagnosis not present

## 2015-12-03 DIAGNOSIS — E782 Mixed hyperlipidemia: Secondary | ICD-10-CM | POA: Diagnosis not present

## 2015-12-03 DIAGNOSIS — M1990 Unspecified osteoarthritis, unspecified site: Secondary | ICD-10-CM | POA: Diagnosis not present

## 2015-12-03 DIAGNOSIS — R293 Abnormal posture: Secondary | ICD-10-CM | POA: Diagnosis not present

## 2015-12-04 DIAGNOSIS — M1990 Unspecified osteoarthritis, unspecified site: Secondary | ICD-10-CM | POA: Diagnosis not present

## 2015-12-04 DIAGNOSIS — R278 Other lack of coordination: Secondary | ICD-10-CM | POA: Diagnosis not present

## 2015-12-04 DIAGNOSIS — R293 Abnormal posture: Secondary | ICD-10-CM | POA: Diagnosis not present

## 2015-12-05 DIAGNOSIS — R293 Abnormal posture: Secondary | ICD-10-CM | POA: Diagnosis not present

## 2015-12-05 DIAGNOSIS — I509 Heart failure, unspecified: Secondary | ICD-10-CM | POA: Diagnosis not present

## 2015-12-05 DIAGNOSIS — R531 Weakness: Secondary | ICD-10-CM | POA: Diagnosis not present

## 2015-12-05 DIAGNOSIS — M109 Gout, unspecified: Secondary | ICD-10-CM | POA: Diagnosis not present

## 2015-12-05 DIAGNOSIS — M1990 Unspecified osteoarthritis, unspecified site: Secondary | ICD-10-CM | POA: Diagnosis not present

## 2015-12-05 DIAGNOSIS — E119 Type 2 diabetes mellitus without complications: Secondary | ICD-10-CM | POA: Diagnosis not present

## 2015-12-05 DIAGNOSIS — R278 Other lack of coordination: Secondary | ICD-10-CM | POA: Diagnosis not present

## 2015-12-07 DIAGNOSIS — R278 Other lack of coordination: Secondary | ICD-10-CM | POA: Diagnosis not present

## 2015-12-07 DIAGNOSIS — N390 Urinary tract infection, site not specified: Secondary | ICD-10-CM | POA: Diagnosis not present

## 2015-12-07 DIAGNOSIS — R293 Abnormal posture: Secondary | ICD-10-CM | POA: Diagnosis not present

## 2015-12-07 DIAGNOSIS — M1990 Unspecified osteoarthritis, unspecified site: Secondary | ICD-10-CM | POA: Diagnosis not present

## 2015-12-08 DIAGNOSIS — R278 Other lack of coordination: Secondary | ICD-10-CM | POA: Diagnosis not present

## 2015-12-08 DIAGNOSIS — R293 Abnormal posture: Secondary | ICD-10-CM | POA: Diagnosis not present

## 2015-12-08 DIAGNOSIS — M1990 Unspecified osteoarthritis, unspecified site: Secondary | ICD-10-CM | POA: Diagnosis not present

## 2015-12-09 DIAGNOSIS — R293 Abnormal posture: Secondary | ICD-10-CM | POA: Diagnosis not present

## 2015-12-09 DIAGNOSIS — M1990 Unspecified osteoarthritis, unspecified site: Secondary | ICD-10-CM | POA: Diagnosis not present

## 2015-12-09 DIAGNOSIS — R278 Other lack of coordination: Secondary | ICD-10-CM | POA: Diagnosis not present

## 2015-12-17 DIAGNOSIS — M1000 Idiopathic gout, unspecified site: Secondary | ICD-10-CM | POA: Diagnosis not present

## 2015-12-17 DIAGNOSIS — M545 Low back pain: Secondary | ICD-10-CM | POA: Diagnosis not present

## 2015-12-17 DIAGNOSIS — G894 Chronic pain syndrome: Secondary | ICD-10-CM | POA: Diagnosis not present

## 2015-12-17 DIAGNOSIS — G8929 Other chronic pain: Secondary | ICD-10-CM | POA: Diagnosis not present

## 2015-12-22 DIAGNOSIS — N390 Urinary tract infection, site not specified: Secondary | ICD-10-CM | POA: Diagnosis not present

## 2015-12-31 ENCOUNTER — Emergency Department (HOSPITAL_COMMUNITY): Payer: Medicare Other

## 2015-12-31 ENCOUNTER — Encounter (HOSPITAL_COMMUNITY): Payer: Self-pay | Admitting: Emergency Medicine

## 2015-12-31 ENCOUNTER — Inpatient Hospital Stay (HOSPITAL_COMMUNITY)
Admission: EM | Admit: 2015-12-31 | Discharge: 2016-01-06 | DRG: 637 | Disposition: A | Discharge status: Skilled Nursing Facility | Payer: Medicare Other | Attending: Internal Medicine | Admitting: Internal Medicine

## 2015-12-31 DIAGNOSIS — Z6829 Body mass index (BMI) 29.0-29.9, adult: Secondary | ICD-10-CM

## 2015-12-31 DIAGNOSIS — Z96641 Presence of right artificial hip joint: Secondary | ICD-10-CM | POA: Diagnosis present

## 2015-12-31 DIAGNOSIS — E11649 Type 2 diabetes mellitus with hypoglycemia without coma: Principal | ICD-10-CM | POA: Diagnosis present

## 2015-12-31 DIAGNOSIS — E875 Hyperkalemia: Secondary | ICD-10-CM | POA: Diagnosis present

## 2015-12-31 DIAGNOSIS — Z8249 Family history of ischemic heart disease and other diseases of the circulatory system: Secondary | ICD-10-CM

## 2015-12-31 DIAGNOSIS — R4182 Altered mental status, unspecified: Secondary | ICD-10-CM | POA: Diagnosis not present

## 2015-12-31 DIAGNOSIS — G9340 Encephalopathy, unspecified: Secondary | ICD-10-CM | POA: Diagnosis not present

## 2015-12-31 DIAGNOSIS — R410 Disorientation, unspecified: Secondary | ICD-10-CM

## 2015-12-31 DIAGNOSIS — Z950 Presence of cardiac pacemaker: Secondary | ICD-10-CM

## 2015-12-31 DIAGNOSIS — E162 Hypoglycemia, unspecified: Secondary | ICD-10-CM | POA: Diagnosis not present

## 2015-12-31 DIAGNOSIS — E440 Moderate protein-calorie malnutrition: Secondary | ICD-10-CM | POA: Diagnosis present

## 2015-12-31 DIAGNOSIS — F05 Delirium due to known physiological condition: Secondary | ICD-10-CM | POA: Diagnosis present

## 2015-12-31 DIAGNOSIS — I10 Essential (primary) hypertension: Secondary | ICD-10-CM | POA: Diagnosis present

## 2015-12-31 DIAGNOSIS — Z833 Family history of diabetes mellitus: Secondary | ICD-10-CM

## 2015-12-31 DIAGNOSIS — R000 Tachycardia, unspecified: Secondary | ICD-10-CM | POA: Diagnosis not present

## 2015-12-31 DIAGNOSIS — Z7984 Long term (current) use of oral hypoglycemic drugs: Secondary | ICD-10-CM

## 2015-12-31 DIAGNOSIS — N390 Urinary tract infection, site not specified: Secondary | ICD-10-CM | POA: Diagnosis not present

## 2015-12-31 DIAGNOSIS — N8110 Cystocele, unspecified: Secondary | ICD-10-CM | POA: Diagnosis present

## 2015-12-31 DIAGNOSIS — S199XXA Unspecified injury of neck, initial encounter: Secondary | ICD-10-CM | POA: Diagnosis not present

## 2015-12-31 DIAGNOSIS — R402411 Glasgow coma scale score 13-15, in the field [EMT or ambulance]: Secondary | ICD-10-CM | POA: Diagnosis not present

## 2015-12-31 DIAGNOSIS — R609 Edema, unspecified: Secondary | ICD-10-CM

## 2015-12-31 DIAGNOSIS — D72829 Elevated white blood cell count, unspecified: Secondary | ICD-10-CM

## 2015-12-31 DIAGNOSIS — M542 Cervicalgia: Secondary | ICD-10-CM | POA: Diagnosis not present

## 2015-12-31 DIAGNOSIS — E876 Hypokalemia: Secondary | ICD-10-CM | POA: Diagnosis present

## 2015-12-31 DIAGNOSIS — R51 Headache: Secondary | ICD-10-CM | POA: Diagnosis not present

## 2015-12-31 DIAGNOSIS — S0990XA Unspecified injury of head, initial encounter: Secondary | ICD-10-CM | POA: Diagnosis not present

## 2015-12-31 DIAGNOSIS — E860 Dehydration: Secondary | ICD-10-CM | POA: Diagnosis present

## 2015-12-31 DIAGNOSIS — R601 Generalized edema: Secondary | ICD-10-CM | POA: Diagnosis not present

## 2015-12-31 HISTORY — DX: Type 2 diabetes mellitus without complications: E11.9

## 2015-12-31 LAB — COMPREHENSIVE METABOLIC PANEL
ALBUMIN: 3.7 g/dL (ref 3.5–5.0)
ALK PHOS: 98 U/L (ref 38–126)
ALT: 8 U/L — ABNORMAL LOW (ref 14–54)
ANION GAP: 12 (ref 5–15)
AST: 25 U/L (ref 15–41)
BUN: 16 mg/dL (ref 6–20)
CO2: 21 mmol/L — AB (ref 22–32)
Calcium: 9.9 mg/dL (ref 8.9–10.3)
Chloride: 104 mmol/L (ref 101–111)
Creatinine, Ser: 0.71 mg/dL (ref 0.44–1.00)
GFR calc Af Amer: >60 mL/min (ref >60)
GFR calc non Af Amer: >60 mL/min (ref >60)
Glucose, Bld: 108 mg/dL — ABNORMAL HIGH (ref 65–99)
POTASSIUM: 3.4 mmol/L — AB (ref 3.5–5.1)
SODIUM: 137 mmol/L (ref 135–145)
Total Bilirubin: 0.4 mg/dL (ref 0.3–1.2)
Total Protein: 7.8 g/dL (ref 6.5–8.1)

## 2015-12-31 LAB — URINALYSIS, ROUTINE W REFLEX MICROSCOPIC
BILIRUBIN URINE: NEGATIVE
GLUCOSE, UA: NEGATIVE mg/dL
KETONES UR: NEGATIVE mg/dL
Nitrite: NEGATIVE
PH: 7 (ref 5.0–8.0)
Protein, ur: NEGATIVE mg/dL
Specific Gravity, Urine: <1.005 — ABNORMAL LOW (ref 1.005–1.030)

## 2015-12-31 LAB — I-STAT CG4 LACTIC ACID, ED
LACTIC ACID, VENOUS: 2.17 mmol/L — AB (ref 0.5–2.0)
Lactic Acid, Venous: 2.12 mmol/L (ref 0.5–2.0)

## 2015-12-31 LAB — CBC WITH DIFFERENTIAL/PLATELET
Basophils Absolute: 0 10*3/uL (ref 0.0–0.1)
Basophils Relative: 0 %
EOS PCT: 3 %
Eosinophils Absolute: 0.4 10*3/uL (ref 0.0–0.7)
HEMATOCRIT: 39.3 % (ref 36.0–46.0)
Hemoglobin: 13.2 g/dL (ref 12.0–15.0)
LYMPHS PCT: 16 %
Lymphs Abs: 1.9 10*3/uL (ref 0.7–4.0)
MCH: 28.5 pg (ref 26.0–34.0)
MCHC: 33.6 g/dL (ref 30.0–36.0)
MCV: 84.9 fL (ref 78.0–100.0)
MONO ABS: 0.5 10*3/uL (ref 0.1–1.0)
MONOS PCT: 4 %
NEUTROS ABS: 9.4 10*3/uL — AB (ref 1.7–7.7)
Neutrophils Relative %: 77 %
Platelets: 389 10*3/uL (ref 150–400)
RBC: 4.63 MIL/uL (ref 3.87–5.11)
RDW: 15.7 % — AB (ref 11.5–15.5)
WBC: 12.2 10*3/uL — ABNORMAL HIGH (ref 4.0–10.5)

## 2015-12-31 LAB — URINE MICROSCOPIC-ADD ON: Bacteria, UA: NONE SEEN

## 2015-12-31 LAB — GLUCOSE, CAPILLARY
GLUCOSE-CAPILLARY: 126 mg/dL — AB (ref 65–99)
GLUCOSE-CAPILLARY: 165 mg/dL — AB (ref 65–99)
GLUCOSE-CAPILLARY: 18 mg/dL — AB (ref 65–99)
GLUCOSE-CAPILLARY: 26 mg/dL — AB (ref 65–99)
GLUCOSE-CAPILLARY: 53 mg/dL — AB (ref 65–99)
GLUCOSE-CAPILLARY: 56 mg/dL — AB (ref 65–99)
GLUCOSE-CAPILLARY: 62 mg/dL — AB (ref 65–99)

## 2015-12-31 LAB — MRSA PCR SCREENING: MRSA BY PCR: NEGATIVE

## 2015-12-31 MED ORDER — DEXTROSE-NACL 5-0.45 % IV SOLN
INTRAVENOUS | Status: DC
  Administered 2016-01-01 (×2): via INTRAVENOUS

## 2015-12-31 MED ORDER — VITAMIN D 1000 UNITS PO TABS
1000.0000 [IU] | ORAL_TABLET | Freq: Every day | ORAL | Status: DC
  Administered 2016-01-01 – 2016-01-06 (×6): 1000 [IU] via ORAL
  Filled 2015-12-31 (×6): qty 1

## 2015-12-31 MED ORDER — ENOXAPARIN SODIUM 40 MG/0.4ML ~~LOC~~ SOLN
40.0000 mg | SUBCUTANEOUS | Status: DC
  Administered 2015-12-31 – 2016-01-06 (×7): 40 mg via SUBCUTANEOUS
  Filled 2015-12-31 (×8): qty 0.4

## 2015-12-31 MED ORDER — ADULT MULTIVITAMIN W/MINERALS CH
1.0000 | ORAL_TABLET | Freq: Every day | ORAL | Status: DC
  Administered 2016-01-01 – 2016-01-06 (×6): 1 via ORAL
  Filled 2015-12-31 (×6): qty 1

## 2015-12-31 MED ORDER — POLYETHYLENE GLYCOL 3350 17 GM/SCOOP PO POWD: 17.0000 g | Freq: Every day | ORAL | Status: DC

## 2015-12-31 MED ORDER — INSULIN ASPART 100 UNIT/ML ~~LOC~~ SOLN: 0.0000 [IU] | Freq: Three times a day (TID) | SUBCUTANEOUS | Status: DC

## 2015-12-31 MED ORDER — GABAPENTIN 300 MG PO CAPS
300.0000 mg | ORAL_CAPSULE | Freq: Three times a day (TID) | ORAL | Status: DC
  Administered 2015-12-31 – 2016-01-06 (×18): 300 mg via ORAL
  Filled 2015-12-31 (×9): qty 1
  Filled 2015-12-31: qty 3
  Filled 2015-12-31 (×8): qty 1

## 2015-12-31 MED ORDER — MAGNESIUM OXIDE 400 (241.3 MG) MG PO TABS
400.0000 mg | ORAL_TABLET | Freq: Every day | ORAL | Status: DC
  Administered 2016-01-01 – 2016-01-06 (×6): 400 mg via ORAL
  Filled 2015-12-31 (×6): qty 1

## 2015-12-31 MED ORDER — DEXTROSE 50 % IV SOLN
INTRAVENOUS | Status: AC
  Administered 2015-12-31: 17:00:00 via INTRAVENOUS
  Filled 2015-12-31: qty 50

## 2015-12-31 MED ORDER — SODIUM CHLORIDE 0.9 % IV BOLUS (SEPSIS)
1000.0000 mL | Freq: Once | INTRAVENOUS | Status: AC
  Administered 2015-12-31: 1000 mL via INTRAVENOUS

## 2015-12-31 MED ORDER — CARVEDILOL 3.125 MG PO TABS
6.2500 mg | ORAL_TABLET | Freq: Two times a day (BID) | ORAL | Status: DC
  Administered 2016-01-01 – 2016-01-06 (×11): 6.25 mg via ORAL
  Filled 2015-12-31 (×11): qty 2

## 2015-12-31 MED ORDER — GLUCOSE 40 % PO GEL
ORAL | Status: AC
Start: 2015-12-31 — End: 2015-12-31
  Administered 2015-12-31: 37.5 g
  Filled 2015-12-31: qty 1

## 2015-12-31 MED ORDER — SODIUM CHLORIDE 0.9 % IV SOLN
INTRAVENOUS | Status: DC
  Administered 2015-12-31: 18:00:00 via INTRAVENOUS

## 2015-12-31 MED ORDER — ALLOPURINOL 100 MG PO TABS
200.0000 mg | ORAL_TABLET | Freq: Every day | ORAL | Status: DC
  Administered 2016-01-01 – 2016-01-06 (×6): 200 mg via ORAL
  Filled 2015-12-31 (×6): qty 2

## 2015-12-31 MED ORDER — NITROGLYCERIN 0.4 MG SL SUBL: 0.4000 mg | SUBLINGUAL_TABLET | SUBLINGUAL | Status: DC | PRN

## 2015-12-31 MED ORDER — LORAZEPAM 2 MG/ML IJ SOLN
1.0000 mg | Freq: Once | INTRAMUSCULAR | Status: AC | PRN
  Administered 2015-12-31: 1 mg via INTRAVENOUS
  Filled 2015-12-31: qty 1

## 2015-12-31 MED ORDER — ACETAMINOPHEN 325 MG PO TABS
650.0000 mg | ORAL_TABLET | Freq: Four times a day (QID) | ORAL | Status: DC | PRN
  Administered 2016-01-01 – 2016-01-06 (×11): 650 mg via ORAL
  Filled 2015-12-31 (×12): qty 2

## 2015-12-31 MED ORDER — ACETAMINOPHEN 650 MG RE SUPP: 650.0000 mg | Freq: Four times a day (QID) | RECTAL | Status: DC | PRN

## 2015-12-31 MED ORDER — ONDANSETRON HCL 4 MG/2ML IJ SOLN: 4.0000 mg | Freq: Four times a day (QID) | INTRAMUSCULAR | Status: DC | PRN

## 2015-12-31 MED ORDER — DIAZEPAM 5 MG PO TABS
2.5000 mg | ORAL_TABLET | Freq: Two times a day (BID) | ORAL | Status: DC | PRN
  Administered 2015-12-31 – 2016-01-06 (×4): 2.5 mg via ORAL
  Filled 2015-12-31 (×4): qty 1

## 2015-12-31 MED ORDER — SENNOSIDES-DOCUSATE SODIUM 8.6-50 MG PO TABS: 1.0000 | ORAL_TABLET | Freq: Every evening | ORAL | Status: DC | PRN

## 2015-12-31 MED ORDER — DEXTROSE 50 % IV SOLN
INTRAVENOUS | Status: AC
  Administered 2015-12-31: 25 mL via INTRAVENOUS
  Filled 2015-12-31: qty 50

## 2015-12-31 MED ORDER — DEXTROSE 50 % IV SOLN
25.0000 mL | Freq: Once | INTRAVENOUS | Status: AC
  Administered 2015-12-31: 25 mL via INTRAVENOUS

## 2015-12-31 MED ORDER — ONDANSETRON HCL 4 MG PO TABS: 4.0000 mg | ORAL_TABLET | Freq: Four times a day (QID) | ORAL | Status: DC | PRN

## 2015-12-31 MED ORDER — POLYETHYLENE GLYCOL 3350 17 G PO PACK
17.0000 g | PACK | Freq: Every day | ORAL | Status: DC
  Administered 2016-01-01 – 2016-01-06 (×6): 17 g via ORAL
  Filled 2015-12-31 (×6): qty 1

## 2015-12-31 NOTE — ED Notes (Signed)
PT from [facility] and sent to ED today for eval d/t altered mental status worsening and fall this am off the bed. PT has been on Augmentin antibiotics x4 days for UTI. CBG 125 with EMS.

## 2015-12-31 NOTE — ED Notes (Signed)
Difficult IV access:  - EMS attempted IV x 4 unsuccessfully - 2 failed attempts in ED.  - 2 failed phlebotomy  #20g IV to left forearm intact.

## 2015-12-31 NOTE — ED Notes (Signed)
[facility] in [place]  [facility]: [phone]

## 2015-12-31 NOTE — ED Notes (Signed)
Pt more calm after ativan. No longer screaming. Oriented to person , place and year.  C/c difficulty urinating. 2 attempts at bedpan with no result.   Bladder scan x 3 ~ 650 ml/751ml/670ml. MD aware. Verbal order for foley.   #73fr foley inserted with 900 ml clear yellow urine. Pt verbalized immediate relief of abdominal pain.

## 2015-12-31 NOTE — H&P (Signed)
[facility]          History and Physical    PCP:   Lanette Hampshire, MD   EDP: Merrily Pew, MD  Chief Complaint:  Altered mental status  HPI: Patient is an 82 year old woman who comes from skilled nursing facility after being found on the floor, she did not appear to be unresponsive and was in fact calling out for help. She was transferred to the hospital for evaluation. On _0  mmHg... echo... January, 2012  . Cellulitis     Superficial cellulitis right lower leg   January 14, 2011  . AV block      2:1  January 14, 2011 / pacemaker plan when cellulitis resolved in length  . Bradycardia     2:1  AV block  . Hypokalemia     October, 2012  . Ejection fraction     EF 65%, echo, January, 2012  . Murmur     No significant valvular disease by echo, January, 2012  . Pacemaker     January, 2013 Med[employer]onic Sensa  . Chronic diastolic CHF  (congestive heart failure) ([place])   . Hypertension   . Arthritis     "qwhere"  . Diabetes mellitus without complication [facility])     Past Surgical History  Procedure Laterality Date  . Hip arthroplasty Right   . Partial hysterectomy      vaginal  . Cholecystectomy    . Insert / replace / remove pacemaker  11/2011  . Permanent pacemaker insertion N/A 12/09/2011    Procedure: PERMANENT PACEMAKER INSERTION;  Surgeon: Evans Lance, MD;  Location: [facility], [place] CATH LAB;  Service: Cardiovascular;  Laterality: N/A;    Prior to Admission medications   Medication Sig Start Date End Date Taking? Authorizing Provider  acetaminophen-codeine (TYLENOL #3) 300-30 MG tablet Take 2 tablets by mouth every 4 (four) hours as needed for moderate pain.   Yes Historical Provider, MD  allopurinol (ZYLOPRIM) 100 MG tablet Take 200 mg by mouth daily.   Yes Historical Provider, MD  amoxicillin-clavulanate (AUGMENTIN) 875-125 MG tablet Take 1 tablet by mouth 2 (two) times daily. 10 day course starting  For UTI starting on 12/26/15   Yes Historical Provider, MD  carvedilol ([place])  6.25 MG tablet Take 6.25 mg by mouth 2 (two) times daily with a meal.   Yes Historical Provider, MD  cholecalciferol (VITAMIN D) 1000 UNITS tablet Take 1,000 Units by mouth daily.   Yes Historical Provider, MD  Cranberry 200 MG CAPS Take 1 capsule by mouth daily.   Yes Historical Provider, MD  diazepam (VALIUM) 5 MG tablet Take 2.5 mg by mouth 2 (two) times daily as needed for anxiety or muscle spasms. For muscle spasms   Yes Historical Provider, MD  furosemide (LASIX) 80 MG tablet Take 80 mg by mouth 2 (two) times daily.   Yes Historical Provider, MD  gabapentin (NEURONTIN) 300 MG capsule Take 1 capsule (300 mg total) by mouth 3 (three) times daily. 05/09/15  Yes Okey Regal, PA-C  HYDROcodone-acetaminophen (NORCO) 10-325 MG tablet Take 1 tablet by mouth every 8 (eight) hours.   Yes Historical Provider, MD  magnesium oxide (MAG-OX) 400 (241.3 MG) MG  tablet TAKE ONE TABLET BY MOUTH ONE TIME DAILY 04/02/15  Yes Carlena Bjornstad, MD  metFORMIN (GLUCOPHAGE) 500 MG tablet Take 500 mg by mouth 2 (two) times daily with a meal.   Yes Historical Provider, MD  metolazone (ZAROXOLYN) 2.5 MG tablet TAKE 1 TAB DAILY AS NEEDED FOR DAILY WEIGHT OF 172 LBS OR GREATER 08/27/15  Yes Carlena Bjornstad, MD  Multiple Vitamin (MULTIVITAMIN WITH MINERALS) TABS tablet Take 1 tablet by mouth daily.   Yes Historical Provider, MD  nitroGLYCERIN (NITROSTAT) 0.4 MG SL tablet Place 0.4 mg under the tongue every 5 (five) minutes as needed for chest pain.   Yes Historical Provider, MD  polyethylene glycol powder (GLYCOLAX/MIRALAX) powder Take 17 g by mouth daily.   Yes Historical Provider, MD  potassium chloride SA (K-DUR,KLOR-CON) 20 MEQ tablet Take 2 tablets (40 mEq total) by mouth daily. Patient taking differently: Take 40 mEq by mouth daily with breakfast.  08/14/15  Yes Marjean Donna, MD    Social History:  reports that she has never smoked. She has never used smokeless tobacco. She reports that she does not drink alcohol or use illicit drugs.  Family History  Problem Relation Age of Onset  . Cardiomyopathy Father   . Diabetes Father   . Heart failure Father   . Cancer Son   . Coronary artery disease Son   . Dementia Mother     Review of Systems:  Unable to obtain given current mental state  Physical Exam: Blood pressure 99/60, pulse 95, temperature 97.4 F (36.3 C), temperature source Oral, resp. rate 17, height 5' 1" (1.549 m), weight 69.8 kg (153 lb 14.1 oz), SpO2 87 %. General: Awake, unable to assess orientation, mumbles nonsensically states she is cold repeatedly HEENT: Normocephalic, atraumatic, pupils equal round and reactive to light, dry mucous membranes Neck: Supple, no JVD, no lymphadenopathy, no goiter Cardiovascular: Regular rate and rhythm, no murmurs, rubs or gallops Lungs: Clear to auscultation bilaterally Abdomen: Obese, soft, nontender,  nondistended, positive bowel sounds Extremities: Trace bilateral pitting edema, contracted left lower extremity bilateral heel boots in place Neurologic: Moves all 4 spontaneously, unable to fully assess given current mental state  Labs on Admission:  Results for orders placed or performed during the hospital encounter of 12/31/15 (from the past 48 hour(s))  Urinalysis, Routine w reflex microscopic (not at [facility])     Status: Abnormal   Collection Time: 12/31/15  9:40 AM  Result Value Ref Range   Color, Urine YELLOW YELLOW   APPearance CLEAR CLEAR   Specific  Gravity, Urine <1.005 (L) 1.005 - 1.030   pH 7.0 5.0 - 8.0   Glucose, UA NEGATIVE NEGATIVE mg/dL   Hgb urine dipstick MODERATE (A) NEGATIVE   Bilirubin Urine NEGATIVE NEGATIVE   Ketones, ur NEGATIVE NEGATIVE mg/dL   Protein, ur NEGATIVE NEGATIVE mg/dL   Nitrite NEGATIVE NEGATIVE   Leukocytes, UA TRACE (A) NEGATIVE  Urine microscopic-add on     Status: Abnormal   Collection Time: 12/31/15  9:40 AM  Result Value Ref Range   Squamous Epithelial / LPF 0-5 (A) NONE SEEN   WBC, UA 0-5 0 - 5 WBC/hpf   RBC / HPF 6-30 0 - 5 RBC/hpf   Bacteria, UA NONE SEEN NONE SEEN  Comprehensive metabolic panel     Status: Abnormal   Collection Time: 12/31/15  9:52 AM  Result Value Ref Range   Sodium 137 135 - 145 mmol/L   Potassium 3.4 (L) 3.5 - 5.1 mmol/L   Chloride 104 101 - 111 mmol/L   CO2 21 (L) 22 - 32 mmol/L   Glucose, Bld 108 (H) 65 - 99 mg/dL   BUN 16 6 - 20 mg/dL   Creatinine, Ser 0.71 0.44 - 1.00 mg/dL   Calcium 9.9 8.9 - 10.3 mg/dL   Total Protein 7.8 6.5 - 8.1 g/dL   Albumin 3.7 3.5 - 5.0 g/dL   AST 25 15 - 41 U/L   ALT 8 (L) 14 - 54 U/L   Alkaline Phosphatase 98 38 - 126 U/L   Total Bilirubin 0.4 0.3 - 1.2 mg/dL   GFR calc non Af Amer >60 >60 mL/min   GFR calc Af Amer >60 >60 mL/min    Comment: (NOTE) The eGFR has been calculated using the CKD EPI equation. This calculation has not been validated in all clinical  situations. eGFR's persistently <60 mL/min signify possible Chronic Kidney Disease.    Anion gap 12 5 - 15  CBC WITH DIFFERENTIAL     Status: Abnormal   Collection Time: 12/31/15  9:52 AM  Result Value Ref Range   WBC 12.2 (H) 4.0 - 10.5 K/uL   RBC 4.63 3.87 - 5.11 MIL/uL   Hemoglobin 13.2 12.0 - 15.0 g/dL   HCT 39.3 36.0 - 46.0 %   MCV 84.9 78.0 - 100.0 fL   MCH 28.5 26.0 - 34.0 pg   MCHC 33.6 30.0 - 36.0 g/dL   RDW 15.7 (H) 11.5 - 15.5 %   Platelets 389 150 - 400 K/uL   Neutrophils Relative % 77 %   Neutro Abs 9.4 (H) 1.7 - 7.7 K/uL   Lymphocytes Relative 16 %   Lymphs Abs 1.9 0.7 - 4.0 K/uL   Monocytes Relative 4 %   Monocytes Absolute 0.5 0.1 - 1.0 K/uL   Eosinophils Relative 3 %   Eosinophils Absolute 0.4 0.0 - 0.7 K/uL   Basophils Relative 0 %   Basophils Absolute 0.0 0.0 - 0.1 K/uL  I-Stat CG4 Lactic Acid, ED  (not at  [facility])     Status: Abnormal   Collection Time: 12/31/15 10:03 AM  Result Value Ref Range   Lactic Acid, Venous 2.17 (HH) 0.5 - 2.0 mmol/L   Comment NOTIFIED PHYSICIAN   I-Stat CG4 Lactic Acid, ED  (not at  [facility])     Status: Abnormal   Collection Time: 12/31/15 12:54 PM  Result Value Ref Range   Lactic Acid, Venous 2.12 (HH) 0.5 - 2.0 mmol/L   Comment NOTIFIED PHYSICIAN   Glucose, capillary  Status: Abnormal   Collection Time: 12/31/15  4:33 PM  Result Value Ref Range   Glucose-Capillary 26 (LL) 65 - 99 mg/dL   Comment 1 Notify RN   Glucose, capillary     Status: Abnormal   Collection Time: 12/31/15  4:54 PM  Result Value Ref Range   Glucose-Capillary 18 (LL) 65 - 99 mg/dL   Comment 1 Call MD NNP PA CNM   Glucose, capillary     Status: Abnormal   Collection Time: 12/31/15  5:13 PM  Result Value Ref Range   Glucose-Capillary 165 (H) 65 - 99 mg/dL   Comment 1 Notify RN     Radiological Exams on Admission: Dg Chest 2 View  12/31/2015  CLINICAL DATA:  Altered mental status EXAM: CHEST  2 VIEW COMPARISON:  August 12, 2015 FINDINGS: There is  no edema or consolidation. The heart size and pulmonary vascularity are normal. No adenopathy. Pacemaker leads are attached to the right atrium and right ventricle. There is extensive arthropathy in each shoulder. IMPRESSION: No edema or consolidation. Electronically Signed   By: Lowella Grip III M.D.   On: 12/31/2015 10:17   Ct Head Wo Contrast  12/31/2015  CLINICAL DATA:  Pain following fall EXAM: CT HEAD WITHOUT CONTRAST CT CERVICAL SPINE WITHOUT CONTRAST TECHNIQUE: Multidetector CT imaging of the head and cervical spine was performed following the standard protocol without intravenous contrast. Multiplanar CT image reconstructions of the cervical spine were also generated. COMPARISON:  CT head and CT cervical spine April 21, 2014 FINDINGS: CT HEAD FINDINGS Age related volume loss is stable. There is no intracranial mass, hemorrhage, extra-axial fluid collection, or midline shift. There is slight small vessel disease in the centra semiovale bilaterally. Elsewhere gray-white compartments appear normal. No acute infarct evident. The bony calvarium appears intact. The mastoid air cells are clear. No intraorbital lesions are identified. CT CERVICAL SPINE FINDINGS There is no demonstrable fracture. There is stable slight retrolisthesis of C5 on C6 and slight stable retrolisthesis at C6 and C7 as well as slight anterolisthesis of C7 on T1, felt to be due to underlying spondylosis and stable. There is no new spondylolisthesis. The prevertebral soft tissues and predental space regions are normal. There is marked disc space narrowing at all levels. There is ankylosis at C3-4, stable. There is facet hypertrophy to varying degrees at all levels bilaterally. There is exit foraminal narrowing at multiple levels due to bony hypertrophy. These changes are greatest at C4-5 on the right and C5-6 and C6-7 bilaterally. There is no high-grade stenosis. A previously noted 9 mm nodular lesion in the right lobe of the thyroid is  stable. Thyroid appears mildly inhomogeneous but stable. IMPRESSION: CT head: Age related volume loss with slight periventricular small vessel disease. No intracranial mass, hemorrhage, or extra-axial fluid collection. No evidence of acute infarct. CT cervical spine: No demonstrable fracture. Areas of slight spondylolisthesis remain stable and are felt to be due to underlying spondylosis. There is extensive multilevel osteoarthritic change/spondylosis. No high-grade stenosis. Stable small nodular lesion right lobe of thyroid. Thyroid appears mildly inhomogeneous but stable overall. Electronically Signed   By: Lowella Grip III M.D.   On: 12/31/2015 10:57   Ct Cervical Spine Wo Contrast  12/31/2015  CLINICAL DATA:  Pain following fall EXAM: CT HEAD WITHOUT CONTRAST CT CERVICAL SPINE WITHOUT CONTRAST TECHNIQUE: Multidetector CT imaging of the head and cervical spine was performed following the standard protocol without intravenous contrast. Multiplanar CT image reconstructions of the cervical spine were also  generated. COMPARISON:  CT head and CT cervical spine April 21, 2014 FINDINGS: CT HEAD FINDINGS Age related volume loss is stable. There is no intracranial mass, hemorrhage, extra-axial fluid collection, or midline shift. There is slight small vessel disease in the centra semiovale bilaterally. Elsewhere gray-white compartments appear normal. No acute infarct evident. The bony calvarium appears intact. The mastoid air cells are clear. No intraorbital lesions are identified. CT CERVICAL SPINE FINDINGS There is no demonstrable fracture. There is stable slight retrolisthesis of C5 on C6 and slight stable retrolisthesis at C6 and C7 as well as slight anterolisthesis of C7 on T1, felt to be due to underlying spondylosis and stable. There is no new spondylolisthesis. The prevertebral soft tissues and predental space regions are normal. There is marked disc space narrowing at all levels. There is ankylosis at C3-4,  stable. There is facet hypertrophy to varying degrees at all levels bilaterally. There is exit foraminal narrowing at multiple levels due to bony hypertrophy. These changes are greatest at C4-5 on the right and C5-6 and C6-7 bilaterally. There is no high-grade stenosis. A previously noted 9 mm nodular lesion in the right lobe of the thyroid is stable. Thyroid appears mildly inhomogeneous but stable. IMPRESSION: CT head: Age related volume loss with slight periventricular small vessel disease. No intracranial mass, hemorrhage, or extra-axial fluid collection. No evidence of acute infarct. CT cervical spine: No demonstrable fracture. Areas of slight spondylolisthesis remain stable and are felt to be due to underlying spondylosis. There is extensive multilevel osteoarthritic change/spondylosis. No high-grade stenosis. Stable small nodular lesion right lobe of thyroid. Thyroid appears mildly inhomogeneous but stable overall. Electronically Signed   By: Lowella Grip III M.D.   On: 12/31/2015 10:57    Assessment/Plan Principal Problem:   Acute encephalopathy Active Problems:   Hypoglycemia   Hypertension   Protein-calorie malnutrition, moderate (HCC)     Acute encephalopathy -Believed due to a combination of factors mainly hypoglycemia, also partially treated UTI and mild dehydration are likely playing a role.  Type 2 diabetes -With profound hypoglycemia.  -She is on metformin at SNF. -Per son she has not had decreased oral intake over the past few days. -Insulin is not listed in her MAR from SNF, I cannot completely rule out a medication error as the cause of her hypoglycemia. -Metformin will be discontinued, has received 2 amps of D50 with resultant CBG of 165. -Plan to check CBGs every 2 hours until 2 consecutive measurements above 90, then we'll revert to every before meals and daily at bedtime monitoring. -Plan to start dextrose drip if becomes hypoglycemic again.  Hypertension -We'll  control, continue home medications  DVT prophylaxis -Lovenox  CODE STATUS Full code   Time Spent on Admission: 85 minutes  [facility] Pager: [phone] 12/31/2015, 5:19 PM

## 2015-12-31 NOTE — ED Provider Notes (Signed)
CSN: [id number]     Arrival date & time 12/31/15  0918 History  By signing my name below, I, Doreatha Martin, attest that this documentation has been prepared under the direction and in the presence of Marily Memos, MD. Electronically Signed: Doreatha Martin, ED Scribe. 12/31/2015. 9:30 AM.   Chief Complaint  Patient presents with  . Altered Mental Status   The history is provided by the patient, the EMS personnel and the nursing home. The history is limited by the condition of the patient. No language interpreter was used.    LEVEL 5 CAVEAT: HPI and ROS limited due to AMS   HPI Comments: [name] is a 82 y.o. female with h/o AV block, pacemaker, chronic systolic CHF, HTN who presents to the Emergency Department due to worsened AMS and fall this morning. Pt states she remembers falling out of her bed and hitting her head. LOC unknown. Pt is from [facility]. Per EMS, VSS en route. Per nursing facility, pt has been on Augmentin for 4 days d/t UTI. Per facility, the pt is normally alert and communicative.   Past Medical History  Diagnosis Date  . Edema     . EF 65-70%, echo, November 29, 2010  . Pulmonary hypertension (HCC)      46 mmHg... echo... January, 2012  . Cellulitis     Superficial cellulitis right lower leg   January 14, 2011  . AV block      2:1  January 14, 2011 / pacemaker plan when cellulitis resolved in length  . Bradycardia     2:1  AV block  . Hypokalemia     October, 2012  . Ejection fraction     EF 65%, echo, January, 2012  . Murmur     No significant valvular disease by echo, January, 2012  . Pacemaker     January, 2013 Med[employer]onic Sensa  . Chronic diastolic CHF (congestive heart failure) (HCC)   . Hypertension   . Arthritis     "qwhere"  . Diabetes mellitus without complication [facility])    Past Surgical History  Procedure Laterality Date  . Hip arthroplasty Right   . Partial hysterectomy      vaginal  . Cholecystectomy    . Insert / replace / remove  pacemaker  11/2011  . Permanent pacemaker insertion N/A 12/09/2011    Procedure: PERMANENT PACEMAKER INSERTION;  Surgeon: Marinus Maw, MD;  Location: [facility] CATH LAB;  Service: Cardiovascular;  Laterality: N/A;   Family History  Problem Relation Age of Onset  . Cardiomyopathy Father   . Diabetes Father   . Heart failure Father   . Cancer Son   . Coronary artery disease Son   . Dementia Mother    Social History  Substance Use Topics  . Smoking status: Never Smoker   . Smokeless tobacco: Never Used  . Alcohol Use: No   OB History    No data available     Review of Systems  Unable to perform ROS: Mental status change   Allergies  Aspirin and Tramadol  Home Medications   Prior to Admission medications   Medication Sig Start Date End Date Taking? Authorizing Provider  acetaminophen-codeine (TYLENOL #3) 300-30 MG tablet Take 2 tablets by mouth every 4 (four) hours as needed for moderate pain.   Yes Historical Provider, MD  allopurinol (ZYLOPRIM) 100 MG tablet Take 200 mg by mouth daily.   Yes Historical Provider, MD  amoxicillin-clavulanate (AUGMENTIN) 875-125 MG tablet Take  1 tablet by mouth 2 (two) times daily. 10 day course starting  For UTI starting on 12/26/15   Yes Historical Provider, MD  carvedilol (COREG) 6.25 MG tablet Take 6.25 mg by mouth 2 (two) times daily with a meal.   Yes Historical Provider, MD  cholecalciferol (VITAMIN D) 1000 UNITS tablet Take 1,000 Units by mouth daily.   Yes Historical Provider, MD  Cranberry 200 MG CAPS Take 1 capsule by mouth daily.   Yes Historical Provider, MD  diazepam (VALIUM) 5 MG tablet Take 2.5 mg by mouth 2 (two) times daily as needed for anxiety or muscle spasms. For muscle spasms   Yes Historical Provider, MD  furosemide (LASIX) 80 MG tablet Take 80 mg by mouth 2 (two) times daily.   Yes Historical Provider, MD  gabapentin (NEURONTIN) 300 MG capsule Take 1 capsule (300 mg total) by mouth 3 (three) times daily. 05/09/15  Yes Eyvonne Mechanic, PA-C  HYDROcodone-acetaminophen (NORCO) 10-325 MG tablet Take 1 tablet by mouth every 8 (eight) hours.   Yes Historical Provider, MD  magnesium oxide (MAG-OX) 400 (241.3 MG) MG tablet TAKE ONE TABLET BY MOUTH ONE TIME DAILY 04/02/15  Yes Luis Abed, MD  metFORMIN (GLUCOPHAGE) 500 MG tablet Take 500 mg by mouth 2 (two) times daily with a meal.   Yes Historical Provider, MD  metolazone (ZAROXOLYN) 2.5 MG tablet TAKE 1 TAB DAILY AS NEEDED FOR DAILY WEIGHT OF 172 LBS OR GREATER 08/27/15  Yes Luis Abed, MD  Multiple Vitamin (MULTIVITAMIN WITH MINERALS) TABS tablet Take 1 tablet by mouth daily.   Yes Historical Provider, MD  nitroGLYCERIN (NITROSTAT) 0.4 MG SL tablet Place 0.4 mg under the tongue every 5 (five) minutes as needed for chest pain.   Yes Historical Provider, MD  polyethylene glycol powder (GLYCOLAX/MIRALAX) powder Take 17 g by mouth daily.   Yes Historical Provider, MD  potassium chloride SA (K-DUR,KLOR-CON) 20 MEQ tablet Take 2 tablets (40 mEq total) by mouth daily. Patient taking differently: Take 40 mEq by mouth daily with breakfast.  08/14/15  Yes Angus McInnis, MD   BP 111/65 mmHg  Pulse 85  Temp(Src) 98.7 F (37.1 C) (Rectal)  Resp 17  Ht 5' (1.524 m)  Wt 155 lb (70.308 kg)  BMI 30.27 kg/m2  SpO2 100% Physical Exam  Constitutional: She appears well-developed and well-nourished.  HENT:  Head: Normocephalic.  Eyes: Conjunctivae and EOM are normal. Pupils are equal, round, and reactive to light.  Neck: Normal range of motion. Neck supple.  Cardiovascular: Tachycardia present.   Tachycardic with paced rhythm.   Pulmonary/Chest: Effort normal. No respiratory distress.  Pt not cooperative on lung exam, but no obvious abnormalities.   Abdominal: Soft. Bowel sounds are normal. She exhibits no distension and no mass. There is no tenderness. There is no rebound and no guarding.  Musculoskeletal: Normal range of motion. She exhibits tenderness.  Pressure ulcers on  bilateral lower extremities. Pain with ROM of left leg. No neck or back tenderness. No tenderness to bilateral upper or lower extremities.   Neurological: She is alert.  Skin: Skin is warm and dry. No rash noted.  Psychiatric: She has a normal mood and affect. Her behavior is normal.  Nursing note and vitals reviewed.   ED Course  Procedures (including critical care time) DIAGNOSTIC STUDIES: Oxygen Saturation is 97% on RA, normal by my interpretation.    COORDINATION OF CARE: 9:27 AM Discussed treatment plan with pt at bedside and pt agreed to plan.  Labs Review Labs Reviewed  COMPREHENSIVE METABOLIC PANEL - Abnormal; Notable for the following:    Potassium 3.4 (*)    CO2 21 (*)    Glucose, Bld 108 (*)    ALT 8 (*)    All other components within normal limits  CBC WITH DIFFERENTIAL/PLATELET - Abnormal; Notable for the following:    WBC 12.2 (*)    RDW 15.7 (*)    Neutro Abs 9.4 (*)    All other components within normal limits  URINALYSIS, ROUTINE W REFLEX MICROSCOPIC (NOT AT [facility]) - Abnormal; Notable for the following:    Specific Gravity, Urine <1.005 (*)    Hgb urine dipstick MODERATE (*)    Leukocytes, UA TRACE (*)    All other components within normal limits  URINE MICROSCOPIC-ADD ON - Abnormal; Notable for the following:    Squamous Epithelial / LPF 0-5 (*)    All other components within normal limits  I-STAT CG4 LACTIC ACID, ED - Abnormal; Notable for the following:    Lactic Acid, Venous 2.17 (*)    All other components within normal limits  I-STAT CG4 LACTIC ACID, ED - Abnormal; Notable for the following:    Lactic Acid, Venous 2.12 (*)    All other components within normal limits  CULTURE, BLOOD (ROUTINE X 2)  CULTURE, BLOOD (ROUTINE X 2)  URINE CULTURE    Imaging Review Dg Chest 2 View  12/31/2015  CLINICAL DATA:  Altered mental status EXAM: CHEST  2 VIEW COMPARISON:  August 12, 2015 FINDINGS: There is no edema or consolidation. The heart size and  pulmonary vascularity are normal. No adenopathy. Pacemaker leads are attached to the right atrium and right ventricle. There is extensive arthropathy in each shoulder. IMPRESSION: No edema or consolidation. Electronically Signed   By: Bretta Bang III M.D.   On: 12/31/2015 10:17   Ct Head Wo Contrast  12/31/2015  CLINICAL DATA:  Pain following fall EXAM: CT HEAD WITHOUT CONTRAST CT CERVICAL SPINE WITHOUT CONTRAST TECHNIQUE: Multidetector CT imaging of the head and cervical spine was performed following the standard protocol without intravenous contrast. Multiplanar CT image reconstructions of the cervical spine were also generated. COMPARISON:  CT head and CT cervical spine April 21, 2014 FINDINGS: CT HEAD FINDINGS Age related volume loss is stable. There is no intracranial mass, hemorrhage, extra-axial fluid collection, or midline shift. There is slight small vessel disease in the centra semiovale bilaterally. Elsewhere gray-white compartments appear normal. No acute infarct evident. The bony calvarium appears intact. The mastoid air cells are clear. No intraorbital lesions are identified. CT CERVICAL SPINE FINDINGS There is no demonstrable fracture. There is stable slight retrolisthesis of C5 on C6 and slight stable retrolisthesis at C6 and C7 as well as slight anterolisthesis of C7 on T1, felt to be due to underlying spondylosis and stable. There is no new spondylolisthesis. The prevertebral soft tissues and predental space regions are normal. There is marked disc space narrowing at all levels. There is ankylosis at C3-4, stable. There is facet hypertrophy to varying degrees at all levels bilaterally. There is exit foraminal narrowing at multiple levels due to bony hypertrophy. These changes are greatest at C4-5 on the right and C5-6 and C6-7 bilaterally. There is no high-grade stenosis. A previously noted 9 mm nodular lesion in the right lobe of the thyroid is stable. Thyroid appears mildly inhomogeneous  but stable. IMPRESSION: CT head: Age related volume loss with slight periventricular small vessel disease. No intracranial mass, hemorrhage, or extra-axial  fluid collection. No evidence of acute infarct. CT cervical spine: No demonstrable fracture. Areas of slight spondylolisthesis remain stable and are felt to be due to underlying spondylosis. There is extensive multilevel osteoarthritic change/spondylosis. No high-grade stenosis. Stable small nodular lesion right lobe of thyroid. Thyroid appears mildly inhomogeneous but stable overall. Electronically Signed   By: Bretta Bang III M.D.   On: 12/31/2015 10:57   Ct Cervical Spine Wo Contrast  12/31/2015  CLINICAL DATA:  Pain following fall EXAM: CT HEAD WITHOUT CONTRAST CT CERVICAL SPINE WITHOUT CONTRAST TECHNIQUE: Multidetector CT imaging of the head and cervical spine was performed following the standard protocol without intravenous contrast. Multiplanar CT image reconstructions of the cervical spine were also generated. COMPARISON:  CT head and CT cervical spine April 21, 2014 FINDINGS: CT HEAD FINDINGS Age related volume loss is stable. There is no intracranial mass, hemorrhage, extra-axial fluid collection, or midline shift. There is slight small vessel disease in the centra semiovale bilaterally. Elsewhere gray-white compartments appear normal. No acute infarct evident. The bony calvarium appears intact. The mastoid air cells are clear. No intraorbital lesions are identified. CT CERVICAL SPINE FINDINGS There is no demonstrable fracture. There is stable slight retrolisthesis of C5 on C6 and slight stable retrolisthesis at C6 and C7 as well as slight anterolisthesis of C7 on T1, felt to be due to underlying spondylosis and stable. There is no new spondylolisthesis. The prevertebral soft tissues and predental space regions are normal. There is marked disc space narrowing at all levels. There is ankylosis at C3-4, stable. There is facet hypertrophy to varying  degrees at all levels bilaterally. There is exit foraminal narrowing at multiple levels due to bony hypertrophy. These changes are greatest at C4-5 on the right and C5-6 and C6-7 bilaterally. There is no high-grade stenosis. A previously noted 9 mm nodular lesion in the right lobe of the thyroid is stable. Thyroid appears mildly inhomogeneous but stable. IMPRESSION: CT head: Age related volume loss with slight periventricular small vessel disease. No intracranial mass, hemorrhage, or extra-axial fluid collection. No evidence of acute infarct. CT cervical spine: No demonstrable fracture. Areas of slight spondylolisthesis remain stable and are felt to be due to underlying spondylosis. There is extensive multilevel osteoarthritic change/spondylosis. No high-grade stenosis. Stable small nodular lesion right lobe of thyroid. Thyroid appears mildly inhomogeneous but stable overall. Electronically Signed   By: Bretta Bang III M.D.   On: 12/31/2015 10:57   I have personally reviewed and evaluated these images and lab results as part of my medical decision-making.   EKG Interpretation   Date/Time:  Thursday December 31 2015 40:98:11 EST Ventricular Rate:  100 PR Interval:  186 QRS Duration: 156 QT Interval:  392 QTC Calculation: 506 R Axis:   -82 Text Interpretation:  Atrial-sensed ventricular-paced rhythm No further  analysis attempted due to paced rhythm Baseline wander in lead(s) V1  similar to 9/16 Confirmed by [facility] MD, Barbara Cower [phone]) on 12/31/2015 9:30:50  AM      MDM   Final diagnoses:  Delirium  Chronic edema    A 63-year-old female gravida be treated for UTI with Augmentin here with delirium worsened after fallen today. CT without any evidence of bleed. Urine seems to be properly treated. She did have a prolapsed bladder so the full catheter was placed to aid and abdominal pain improved however since his patient still altered. She has a slightly elevated lactic acid and leukocytosis so  blood cultures were drawn. No antibiotics started at this time.  I show the cause of her delirium however admitted to the hospital for further workup and management.  I personally performed the services described in this documentation, which was scribed in my presence. The recorded information has been reviewed and is accurate.    Marily Memos, MD 12/31/15 1520

## 2015-12-31 NOTE — Progress Notes (Signed)
Hypoglycemic Event  CBG: 53  Treatment: 15 GM gel  Symptoms: None  Follow-up CBG: Time: CBG Result:62  Possible Reasons for Event: Unknown  Comments/MD notified:Yes    Anberlin Diez L

## 2016-01-01 DIAGNOSIS — E440 Moderate protein-calorie malnutrition: Secondary | ICD-10-CM | POA: Diagnosis not present

## 2016-01-01 DIAGNOSIS — G9340 Encephalopathy, unspecified: Secondary | ICD-10-CM | POA: Diagnosis not present

## 2016-01-01 LAB — BASIC METABOLIC PANEL
ANION GAP: 9 (ref 5–15)
BUN: 11 mg/dL (ref 6–20)
CALCIUM: 9.3 mg/dL (ref 8.9–10.3)
CO2: 24 mmol/L (ref 22–32)
Chloride: 104 mmol/L (ref 101–111)
Creatinine, Ser: 0.58 mg/dL (ref 0.44–1.00)
Glucose, Bld: 141 mg/dL — ABNORMAL HIGH (ref 65–99)
Potassium: 2.8 mmol/L — ABNORMAL LOW (ref 3.5–5.1)
SODIUM: 137 mmol/L (ref 135–145)

## 2016-01-01 LAB — CBC
HCT: 36.2 % (ref 36.0–46.0)
HEMOGLOBIN: 12 g/dL (ref 12.0–15.0)
MCH: 28.2 pg (ref 26.0–34.0)
MCHC: 33.1 g/dL (ref 30.0–36.0)
MCV: 85.2 fL (ref 78.0–100.0)
PLATELETS: 349 10*3/uL (ref 150–400)
RBC: 4.25 MIL/uL (ref 3.87–5.11)
RDW: 15.9 % — ABNORMAL HIGH (ref 11.5–15.5)
WBC: 9.8 10*3/uL (ref 4.0–10.5)

## 2016-01-01 LAB — GLUCOSE, CAPILLARY
GLUCOSE-CAPILLARY: 67 mg/dL (ref 65–99)
GLUCOSE-CAPILLARY: 155 mg/dL — AB (ref 65–99)
GLUCOSE-CAPILLARY: 93 mg/dL (ref 65–99)
GLUCOSE-CAPILLARY: 127 mg/dL — AB (ref 65–99)
GLUCOSE-CAPILLARY: 68 mg/dL (ref 65–99)
Glucose-Capillary: 98 mg/dL (ref 65–99)
Glucose-Capillary: 136 mg/dL — ABNORMAL HIGH (ref 65–99)
Glucose-Capillary: 104 mg/dL — ABNORMAL HIGH (ref 65–99)
Glucose-Capillary: 121 mg/dL — ABNORMAL HIGH (ref 65–99)
Glucose-Capillary: 136 mg/dL — ABNORMAL HIGH (ref 65–99)
Glucose-Capillary: 99 mg/dL (ref 65–99)
Glucose-Capillary: 58 mg/dL — ABNORMAL LOW (ref 65–99)
Glucose-Capillary: 57 mg/dL — ABNORMAL LOW (ref 65–99)
Glucose-Capillary: 87 mg/dL (ref 65–99)
Glucose-Capillary: 78 mg/dL (ref 65–99)

## 2016-01-01 LAB — URINE CULTURE: CULTURE: NO GROWTH

## 2016-01-01 LAB — HEMOGLOBIN A1C
HEMOGLOBIN A1C: 6.4 % — AB (ref 4.8–5.6)
MEAN PLASMA GLUCOSE: 137 mg/dL

## 2016-01-01 LAB — MAGNESIUM: MAGNESIUM: 1.3 mg/dL — AB (ref 1.7–2.4)

## 2016-01-01 MED ORDER — DEXTROSE 50 % IV SOLN
25.0000 mL | Freq: Once | INTRAVENOUS | Status: AC
  Administered 2016-01-01: 25 mL via INTRAVENOUS

## 2016-01-01 MED ORDER — POTASSIUM CHLORIDE CRYS ER 20 MEQ PO TBCR
40.0000 meq | EXTENDED_RELEASE_TABLET | ORAL | Status: AC
  Administered 2016-01-01 (×4): 40 meq via ORAL
  Filled 2016-01-01 (×4): qty 2

## 2016-01-01 MED ORDER — DEXTROSE 50 % IV SOLN
INTRAVENOUS | Status: AC
  Filled 2016-01-01: qty 50

## 2016-01-01 NOTE — Progress Notes (Signed)
Hypoglycemic Event  CBG: 57  Treatment: 15 GM carbohydrate snack  Symptoms: None  Follow-up CBG: Time:1945 CBG Result:68  Possible Reasons for Event: Unknown  Comments/MD notified:  Patient alert and able to drink juice.  Recheck at 1945 with additional juice given and patient alert.    Dorene Grebe

## 2016-01-01 NOTE — Progress Notes (Signed)
Patient had hypoglycemic event earlier in shift along with loss of IV access.  Patient was given glucose gel, and multiple attempts to regain IV access with many attempts before successfully gaining IV access.  Patient alert and cooperative during entire time. MD notified.  Patient CBG monitored and IV fluids changed.  Will continue to monitor patient. Also received order for patient to be on telemetry.

## 2016-01-01 NOTE — Progress Notes (Signed)
Hypoglycemic Event  CBG: 53  Treatment: D50 IV 25 mL  Symptoms: weak  Follow-up CBG: Time:1651 CBG Result:  93  Possible Reasons for Event: Inadequate meal intake  Comments/MD notified:  Dr. Ardyth Harps on unit and notified and gave order to administer D50 25 ml    [name], [facility]

## 2016-01-01 NOTE — Progress Notes (Signed)
Patient c/o left knee pain and asking for cream for left knee.  Dr. Ardyth Harps notified via text page.

## 2016-01-01 NOTE — Care Management Obs Status (Signed)
MEDICARE OBSERVATION STATUS NOTIFICATION   Patient Details  Name: [name] MRN: [id number] Date of Birth: Feb 23, 1931   Medicare Observation Status Notification Given:  Yes    Adonis Huguenin, RN 01/01/2016, 9:26 AM

## 2016-01-01 NOTE — NC FL2 (Signed)
[place] MEDICAID FL2 LEVEL OF CARE SCREENING TOOL     IDENTIFICATION  Patient Name: [name] Birthdate: 09/29/1931 Sex: female Admission Date (Current Location): 12/31/2015  [facility] and [place] Number:  [employer] and Address:  [facility],  [address] [place] [address]      Provider Number: [id number]  Attending Physician Name and Address:  Micael Hampshire Acost*  Relative Name and Phone Number:       Current Level of Care: Hospital Recommended Level of Care: Skilled Nursing Facility Prior Approval Number:    Date Approved/Denied:   PASRR Number:    Discharge Plan: SNF    Current Diagnoses: Patient Active Problem List   Diagnosis Date Noted  . Acute encephalopathy 12/31/2015  . Hypoglycemia 12/31/2015  . Chest pain 08/12/2015  . Chronic edema 04/30/2015  . Elevated troponin 04/30/2015  . Peripheral edema 04/30/2015  . Nocturnal leg cramps 04/06/2015  . Pressure in chest 01/13/2015  . Generalized weakness 08/06/2014  . Protein-calorie malnutrition, moderate (HCC) 08/06/2014  . Osteoarthritis 04/29/2014  . Anxiety 04/28/2014  . Neck pain 04/28/2014  . Weakness 04/21/2014  . FTT (failure to thrive) in adult 04/21/2014  . Chronic diastolic CHF (congestive heart failure) (HCC)   . Pacemaker   . Hypertension 12/10/2011  . Cardiac pacemaker 12/10/2011  . Murmur   . Hypokalemia   . Ejection fraction   . Edema   . AV block   . Cellulitis   . Pulmonary hypertension (HCC)     Orientation RESPIRATION BLADDER Height & Weight     Self    Continent Weight: 153 lb 14.1 oz (69.8 kg) Height:   (154.9 cm)  BEHAVIORAL SYMPTOMS/MOOD NEUROLOGICAL BOWEL NUTRITION STATUS      Continent Diet (Heart Healthy)  AMBULATORY STATUS COMMUNICATION OF NEEDS Skin   Limited Assist Verbally Normal                       Personal Care Assistance Level of Assistance  Bathing, Dressing Bathing Assistance: Limited assistance    Dressing Assistance: Limited assistance     Functional Limitations Info             SPECIAL CARE FACTORS FREQUENCY  PT (By licensed PT), OT (By licensed OT)                    Contractures      Additional Factors Info  Code Status               Current Medications (01/01/2016):  This is the current hospital active medication list Current Facility-Administered Medications  Medication Dose Route Frequency Provider Last Rate Last Dose  . acetaminophen (TYLENOL) tablet 650 mg  650 mg Oral Q6H PRN Henderson Cloud, MD   650 mg at 01/01/16 0950   Or  . acetaminophen (TYLENOL) suppository 650 mg  650 mg Rectal Q6H PRN Henderson Cloud, MD      . allopurinol (ZYLOPRIM) tablet 200 mg  200 mg Oral Daily Henderson Cloud, MD   200 mg at 01/01/16 0919  . carvedilol (COREG) tablet 6.25 mg  6.25 mg Oral BID WC Henderson Cloud, MD   6.25 mg at 12/31/15 1730  . cholecalciferol (VITAMIN D) tablet 1,000 Units  1,000 Units Oral Daily Henderson Cloud, MD   1,000 Units at 01/01/16 0920  . dextrose 5 %-0.45 % sodium chloride infusion  Intravenous Continuous Roma Kayser Schorr, NP 50 mL/hr at 01/01/16 0026    . diazepam (VALIUM) tablet 2.5 mg  2.5 mg Oral BID PRN Henderson Cloud, MD   2.5 mg at 12/31/15 1955  . enoxaparin (LOVENOX) injection 40 mg  40 mg Subcutaneous Q24H Henderson Cloud, MD   40 mg at 12/31/15 1908  . gabapentin (NEURONTIN) capsule 300 mg  300 mg Oral TID Henderson Cloud, MD   300 mg at 01/01/16 0919  . magnesium oxide (MAG-OX) tablet 400 mg  400 mg Oral Daily Henderson Cloud, MD   400 mg at 01/01/16 9233  . multivitamin with minerals tablet 1 tablet  1 tablet Oral Daily Henderson Cloud, MD   1 tablet at 01/01/16 0920  . nitroGLYCERIN (NITROSTAT) SL tablet 0.4 mg  0.4 mg Sublingual Q5 min PRN Henderson Cloud, MD      . ondansetron [facility]) tablet 4 mg  4 mg Oral Q6H  PRN Henderson Cloud, MD       Or  . ondansetron [facility]) injection 4 mg  4 mg Intravenous Q6H PRN Henderson Cloud, MD      . polyethylene glycol (MIRALAX / GLYCOLAX) packet 17 g  17 g Oral Daily Henderson Cloud, MD   17 g at 01/01/16 0076  . potassium chloride SA (K-DUR,KLOR-CON) CR tablet 40 mEq  40 mEq Oral Q4H Henderson Cloud, MD   40 mEq at 01/01/16 0920  . senna-docusate (Senokot-S) tablet 1 tablet  1 tablet Oral QHS PRN Henderson Cloud, MD         Discharge Medications: Please see discharge summary for a list of discharge medications.  Relevant Imaging Results:  Relevant Lab Results:   Additional Information SSN [id number]  Liliana Cline, LCSW

## 2016-01-01 NOTE — Progress Notes (Signed)
[facility] PROGRESS NOTE  [name] ZOX:[id number] DOB: 01/16/1931 DOA: 12/31/2015 PCP: Alice Reichert, MD  Assessment/Plan: Acute encephalopathy -Believed due to a combination of UTI but mostly profound hypoglycemia. -Much improved today.  Type 2 diabetes with profound hypoglycemia -We'll discontinue metformin indefinitely at this point. -She continues to have episodes of hypoglycemia and has been started on a dextrose drip, Amps ofD50 have been given when necessary. Continue D5 drip for now.  Hypertension  -Well-controlled, continue medications    Code Status: Full code Family Communication: Daughter [name] and son at bedside updated on plan of care and questions answered  Disposition Plan: Back to SNF when deemed stable.   Consultants:  None   Antibiotics:  None   Subjective: Much more alert, feisty, pleasant to speak with  Objective: Filed Vitals:   01/01/16 0519 01/01/16 0918 01/01/16 1011 01/01/16 1300  BP: 102/60 110/56  107/49  Pulse: 95 96 100 75  Temp: 98.2 F (36.8 C)   97.7 F (36.5 C)  TempSrc: Oral   Oral  Resp: 18   17  Height:      Weight:      SpO2: 100%   99%    Intake/Output Summary (Last 24 hours) at 01/01/16 1723 Last data filed at 01/01/16 0800  Gross per 24 hour  Intake    600 ml  Output     50 ml  Net    550 ml   Filed Weights   12/31/15 0925 12/31/15 1522  Weight: 70.308 kg (155 lb) 69.8 kg (153 lb 14.1 oz)    Exam:   General:  Alert, awake, oriented 3  Cardiovascular: Regular rate and rhythm  Respiratory: Clear to auscultation bilaterally  Abdomen: Soft, nontender, nondistended, positive bowel sounds  Extremities: No clubbing, cyanosis or edema, positive pulses   Neurologic:  Grossly intact and nonfocal  Data Reviewed: Basic Metabolic Panel:  Recent Labs Lab 12/31/15 0952 01/01/16 0658  NA 137 137  K 3.4* 2.8*  CL 104 104  CO2 21* 24  GLUCOSE 108* 141*  BUN 16 11  CREATININE 0.71 0.58   CALCIUM 9.9 9.3  MG  --  1.3*   Liver Function Tests:  Recent Labs Lab 12/31/15 0952  AST 25  ALT 8*  ALKPHOS 98  BILITOT 0.4  PROT 7.8  ALBUMIN 3.7   No results for input(s): LIPASE, AMYLASE in the last 168 hours. No results for input(s): AMMONIA in the last 168 hours. CBC:  Recent Labs Lab 12/31/15 0952 01/01/16 0658  WBC 12.2* 9.8  NEUTROABS 9.4*  --   HGB 13.2 12.0  HCT 39.3 36.2  MCV 84.9 85.2  PLT 389 349   Cardiac Enzymes: No results for input(s): CKTOTAL, CKMB, CKMBINDEX, TROPONINI in the last 168 hours. BNP (last 3 results)  Recent Labs  04/30/15 0040 05/09/15 1900 08/12/15 2018  BNP 81.0 83.7 89.0    ProBNP (last 3 results) No results for input(s): PROBNP in the last 8760 hours.  CBG:  Recent Labs Lab 01/01/16 1135 01/01/16 1217 01/01/16 1446 01/01/16 1616 01/01/16 1651  GLUCAP 136* 121* 99 58* 93    Recent Results (from the past 240 hour(s))  Urine culture     Status: None   Collection Time: 12/31/15  9:39 AM  Result Value Ref Range Status   Specimen Description URINE, CATHETERIZED  Final   Special Requests NONE  Final   Culture   Final    NO GROWTH 1 DAY Performed at [facility]  [facility]    Report Status 01/01/2016 FINAL  Final  Blood Culture (routine x 2)     Status: None (Preliminary result)   Collection Time: 12/31/15  1:10 PM  Result Value Ref Range Status   Specimen Description BLOOD RIGHT ARM  Final   Special Requests   Final    BOTTLES DRAWN AEROBIC AND ANAEROBIC AEB=9CC ANA=12CC   Culture NO GROWTH < 24 HOURS  Final   Report Status PENDING  Incomplete  Blood Culture (routine x 2)     Status: None (Preliminary result)   Collection Time: 12/31/15  1:30 PM  Result Value Ref Range Status   Specimen Description BLOOD RIGHT FOREARM  Final   Special Requests BOTTLES DRAWN AEROBIC AND ANAEROBIC 10CC EACH  Final   Culture NO GROWTH < 24 HOURS  Final   Report Status PENDING  Incomplete  MRSA PCR Screening     Status: None     Collection Time: 12/31/15  7:30 PM  Result Value Ref Range Status   MRSA by PCR NEGATIVE NEGATIVE Final    Comment:        The GeneXpert MRSA Assay (FDA approved for NASAL specimens only), is one component of a comprehensive MRSA colonization surveillance program. It is not intended to diagnose MRSA infection nor to guide or monitor treatment for MRSA infections.      Studies: Dg Chest 2 View  12/31/2015  CLINICAL DATA:  Altered mental status EXAM: CHEST  2 VIEW COMPARISON:  August 12, 2015 FINDINGS: There is no edema or consolidation. The heart size and pulmonary vascularity are normal. No adenopathy. Pacemaker leads are attached to the right atrium and right ventricle. There is extensive arthropathy in each shoulder. IMPRESSION: No edema or consolidation. Electronically Signed   By: Bretta Bang III M.D.   On: 12/31/2015 10:17   Ct Head Wo Contrast  12/31/2015  CLINICAL DATA:  Pain following fall EXAM: CT HEAD WITHOUT CONTRAST CT CERVICAL SPINE WITHOUT CONTRAST TECHNIQUE: Multidetector CT imaging of the head and cervical spine was performed following the standard protocol without intravenous contrast. Multiplanar CT image reconstructions of the cervical spine were also generated. COMPARISON:  CT head and CT cervical spine April 21, 2014 FINDINGS: CT HEAD FINDINGS Age related volume loss is stable. There is no intracranial mass, hemorrhage, extra-axial fluid collection, or midline shift. There is slight small vessel disease in the centra semiovale bilaterally. Elsewhere gray-white compartments appear normal. No acute infarct evident. The bony calvarium appears intact. The mastoid air cells are clear. No intraorbital lesions are identified. CT CERVICAL SPINE FINDINGS There is no demonstrable fracture. There is stable slight retrolisthesis of C5 on C6 and slight stable retrolisthesis at C6 and C7 as well as slight anterolisthesis of C7 on T1, felt to be due to underlying spondylosis and  stable. There is no new spondylolisthesis. The prevertebral soft tissues and predental space regions are normal. There is marked disc space narrowing at all levels. There is ankylosis at C3-4, stable. There is facet hypertrophy to varying degrees at all levels bilaterally. There is exit foraminal narrowing at multiple levels due to bony hypertrophy. These changes are greatest at C4-5 on the right and C5-6 and C6-7 bilaterally. There is no high-grade stenosis. A previously noted 9 mm nodular lesion in the right lobe of the thyroid is stable. Thyroid appears mildly inhomogeneous but stable. IMPRESSION: CT head: Age related volume loss with slight periventricular small vessel disease. No intracranial mass, hemorrhage, or extra-axial fluid collection. No  evidence of acute infarct. CT cervical spine: No demonstrable fracture. Areas of slight spondylolisthesis remain stable and are felt to be due to underlying spondylosis. There is extensive multilevel osteoarthritic change/spondylosis. No high-grade stenosis. Stable small nodular lesion right lobe of thyroid. Thyroid appears mildly inhomogeneous but stable overall. Electronically Signed   By: Bretta Bang III M.D.   On: 12/31/2015 10:57   Ct Cervical Spine Wo Contrast  12/31/2015  CLINICAL DATA:  Pain following fall EXAM: CT HEAD WITHOUT CONTRAST CT CERVICAL SPINE WITHOUT CONTRAST TECHNIQUE: Multidetector CT imaging of the head and cervical spine was performed following the standard protocol without intravenous contrast. Multiplanar CT image reconstructions of the cervical spine were also generated. COMPARISON:  CT head and CT cervical spine April 21, 2014 FINDINGS: CT HEAD FINDINGS Age related volume loss is stable. There is no intracranial mass, hemorrhage, extra-axial fluid collection, or midline shift. There is slight small vessel disease in the centra semiovale bilaterally. Elsewhere gray-white compartments appear normal. No acute infarct evident. The bony  calvarium appears intact. The mastoid air cells are clear. No intraorbital lesions are identified. CT CERVICAL SPINE FINDINGS There is no demonstrable fracture. There is stable slight retrolisthesis of C5 on C6 and slight stable retrolisthesis at C6 and C7 as well as slight anterolisthesis of C7 on T1, felt to be due to underlying spondylosis and stable. There is no new spondylolisthesis. The prevertebral soft tissues and predental space regions are normal. There is marked disc space narrowing at all levels. There is ankylosis at C3-4, stable. There is facet hypertrophy to varying degrees at all levels bilaterally. There is exit foraminal narrowing at multiple levels due to bony hypertrophy. These changes are greatest at C4-5 on the right and C5-6 and C6-7 bilaterally. There is no high-grade stenosis. A previously noted 9 mm nodular lesion in the right lobe of the thyroid is stable. Thyroid appears mildly inhomogeneous but stable. IMPRESSION: CT head: Age related volume loss with slight periventricular small vessel disease. No intracranial mass, hemorrhage, or extra-axial fluid collection. No evidence of acute infarct. CT cervical spine: No demonstrable fracture. Areas of slight spondylolisthesis remain stable and are felt to be due to underlying spondylosis. There is extensive multilevel osteoarthritic change/spondylosis. No high-grade stenosis. Stable small nodular lesion right lobe of thyroid. Thyroid appears mildly inhomogeneous but stable overall. Electronically Signed   By: Bretta Bang III M.D.   On: 12/31/2015 10:57    Scheduled Meds: . allopurinol  200 mg Oral Daily  . carvedilol  6.25 mg Oral BID WC  . cholecalciferol  1,000 Units Oral Daily  . enoxaparin (LOVENOX) injection  40 mg Subcutaneous Q24H  . gabapentin  300 mg Oral TID  . magnesium oxide  400 mg Oral Daily  . multivitamin with minerals  1 tablet Oral Daily  . polyethylene glycol  17 g Oral Daily  . potassium chloride  40 mEq  Oral Q4H   Continuous Infusions: . dextrose 5 % and 0.45% NaCl 50 mL/hr at 01/01/16 0026    Principal Problem:   Acute encephalopathy Active Problems:   Hypoglycemia   Hypertension   Protein-calorie malnutrition, moderate (HCC)    Time spent: 25 minutes. Greater than 50% of this time was spent in direct contact with the patient coordinating care.    Chaya Jan  [facility] Pager [phone]  If 7PM-7AM, please contact night-coverage at [email or web address], password [facility] 01/01/2016, 5:23 PM

## 2016-01-02 ENCOUNTER — Observation Stay (HOSPITAL_COMMUNITY): Payer: Medicare Other

## 2016-01-02 DIAGNOSIS — Z833 Family history of diabetes mellitus: Secondary | ICD-10-CM | POA: Diagnosis not present

## 2016-01-02 DIAGNOSIS — M109 Gout, unspecified: Secondary | ICD-10-CM | POA: Diagnosis not present

## 2016-01-02 DIAGNOSIS — R4182 Altered mental status, unspecified: Secondary | ICD-10-CM | POA: Diagnosis not present

## 2016-01-02 DIAGNOSIS — E119 Type 2 diabetes mellitus without complications: Secondary | ICD-10-CM | POA: Diagnosis not present

## 2016-01-02 DIAGNOSIS — E860 Dehydration: Secondary | ICD-10-CM | POA: Diagnosis present

## 2016-01-02 DIAGNOSIS — F05 Delirium due to known physiological condition: Secondary | ICD-10-CM | POA: Diagnosis present

## 2016-01-02 DIAGNOSIS — G4762 Sleep related leg cramps: Secondary | ICD-10-CM | POA: Diagnosis not present

## 2016-01-02 DIAGNOSIS — R278 Other lack of coordination: Secondary | ICD-10-CM | POA: Diagnosis not present

## 2016-01-02 DIAGNOSIS — I5030 Unspecified diastolic (congestive) heart failure: Secondary | ICD-10-CM | POA: Diagnosis not present

## 2016-01-02 DIAGNOSIS — D72829 Elevated white blood cell count, unspecified: Secondary | ICD-10-CM | POA: Diagnosis not present

## 2016-01-02 DIAGNOSIS — E440 Moderate protein-calorie malnutrition: Secondary | ICD-10-CM | POA: Diagnosis not present

## 2016-01-02 DIAGNOSIS — I4430 Unspecified atrioventricular block: Secondary | ICD-10-CM | POA: Diagnosis not present

## 2016-01-02 DIAGNOSIS — E876 Hypokalemia: Secondary | ICD-10-CM | POA: Diagnosis present

## 2016-01-02 DIAGNOSIS — Z950 Presence of cardiac pacemaker: Secondary | ICD-10-CM | POA: Diagnosis not present

## 2016-01-02 DIAGNOSIS — Z6829 Body mass index (BMI) 29.0-29.9, adult: Secondary | ICD-10-CM | POA: Diagnosis not present

## 2016-01-02 DIAGNOSIS — F419 Anxiety disorder, unspecified: Secondary | ICD-10-CM | POA: Diagnosis not present

## 2016-01-02 DIAGNOSIS — E46 Unspecified protein-calorie malnutrition: Secondary | ICD-10-CM | POA: Diagnosis not present

## 2016-01-02 DIAGNOSIS — R1319 Other dysphagia: Secondary | ICD-10-CM | POA: Diagnosis not present

## 2016-01-02 DIAGNOSIS — I10 Essential (primary) hypertension: Secondary | ICD-10-CM | POA: Diagnosis not present

## 2016-01-02 DIAGNOSIS — R011 Cardiac murmur, unspecified: Secondary | ICD-10-CM | POA: Diagnosis not present

## 2016-01-02 DIAGNOSIS — G9340 Encephalopathy, unspecified: Secondary | ICD-10-CM | POA: Diagnosis not present

## 2016-01-02 DIAGNOSIS — E559 Vitamin D deficiency, unspecified: Secondary | ICD-10-CM | POA: Diagnosis not present

## 2016-01-02 DIAGNOSIS — R627 Adult failure to thrive: Secondary | ICD-10-CM | POA: Diagnosis not present

## 2016-01-02 DIAGNOSIS — R293 Abnormal posture: Secondary | ICD-10-CM | POA: Diagnosis not present

## 2016-01-02 DIAGNOSIS — I270 Primary pulmonary hypertension: Secondary | ICD-10-CM | POA: Diagnosis not present

## 2016-01-02 DIAGNOSIS — N390 Urinary tract infection, site not specified: Secondary | ICD-10-CM | POA: Diagnosis present

## 2016-01-02 DIAGNOSIS — E875 Hyperkalemia: Secondary | ICD-10-CM | POA: Diagnosis present

## 2016-01-02 DIAGNOSIS — R0789 Other chest pain: Secondary | ICD-10-CM | POA: Diagnosis not present

## 2016-01-02 DIAGNOSIS — Z8249 Family history of ischemic heart disease and other diseases of the circulatory system: Secondary | ICD-10-CM | POA: Diagnosis not present

## 2016-01-02 DIAGNOSIS — M6281 Muscle weakness (generalized): Secondary | ICD-10-CM | POA: Diagnosis not present

## 2016-01-02 DIAGNOSIS — Z96641 Presence of right artificial hip joint: Secondary | ICD-10-CM | POA: Diagnosis present

## 2016-01-02 DIAGNOSIS — R609 Edema, unspecified: Secondary | ICD-10-CM | POA: Diagnosis not present

## 2016-01-02 DIAGNOSIS — R41841 Cognitive communication deficit: Secondary | ICD-10-CM | POA: Diagnosis not present

## 2016-01-02 DIAGNOSIS — Z7984 Long term (current) use of oral hypoglycemic drugs: Secondary | ICD-10-CM | POA: Diagnosis not present

## 2016-01-02 DIAGNOSIS — J181 Lobar pneumonia, unspecified organism: Secondary | ICD-10-CM | POA: Diagnosis not present

## 2016-01-02 DIAGNOSIS — G8929 Other chronic pain: Secondary | ICD-10-CM | POA: Diagnosis not present

## 2016-01-02 DIAGNOSIS — N8110 Cystocele, unspecified: Secondary | ICD-10-CM | POA: Diagnosis present

## 2016-01-02 DIAGNOSIS — M1990 Unspecified osteoarthritis, unspecified site: Secondary | ICD-10-CM | POA: Diagnosis not present

## 2016-01-02 DIAGNOSIS — E11649 Type 2 diabetes mellitus with hypoglycemia without coma: Secondary | ICD-10-CM | POA: Diagnosis present

## 2016-01-02 DIAGNOSIS — E162 Hypoglycemia, unspecified: Secondary | ICD-10-CM | POA: Diagnosis not present

## 2016-01-02 LAB — GLUCOSE, CAPILLARY
GLUCOSE-CAPILLARY: 57 mg/dL — AB (ref 65–99)
GLUCOSE-CAPILLARY: 60 mg/dL — AB (ref 65–99)
GLUCOSE-CAPILLARY: 12 mg/dL — AB (ref 65–99)
GLUCOSE-CAPILLARY: 167 mg/dL — AB (ref 65–99)
GLUCOSE-CAPILLARY: 225 mg/dL — AB (ref 65–99)
GLUCOSE-CAPILLARY: 90 mg/dL (ref 65–99)
GLUCOSE-CAPILLARY: 70 mg/dL (ref 65–99)
GLUCOSE-CAPILLARY: 105 mg/dL — AB (ref 65–99)
GLUCOSE-CAPILLARY: 147 mg/dL — AB (ref 65–99)
GLUCOSE-CAPILLARY: 134 mg/dL — AB (ref 65–99)
Glucose-Capillary: 63 mg/dL — ABNORMAL LOW (ref 65–99)
Glucose-Capillary: 69 mg/dL (ref 65–99)
Glucose-Capillary: 283 mg/dL — ABNORMAL HIGH (ref 65–99)
Glucose-Capillary: 309 mg/dL — ABNORMAL HIGH (ref 65–99)
Glucose-Capillary: 110 mg/dL — ABNORMAL HIGH (ref 65–99)
Glucose-Capillary: 34 mg/dL — CL (ref 65–99)
Glucose-Capillary: 143 mg/dL — ABNORMAL HIGH (ref 65–99)
Glucose-Capillary: 156 mg/dL — ABNORMAL HIGH (ref 65–99)
Glucose-Capillary: 191 mg/dL — ABNORMAL HIGH (ref 65–99)

## 2016-01-02 LAB — URINE MICROSCOPIC-ADD ON

## 2016-01-02 LAB — URINALYSIS, ROUTINE W REFLEX MICROSCOPIC
Bilirubin Urine: NEGATIVE
Glucose, UA: 250 mg/dL — AB
Hgb urine dipstick: NEGATIVE
Ketones, ur: NEGATIVE mg/dL
Nitrite: POSITIVE — AB
Protein, ur: NEGATIVE mg/dL
Specific Gravity, Urine: 1.01 (ref 1.005–1.030)
pH: 6.5 (ref 5.0–8.0)

## 2016-01-02 LAB — MAGNESIUM: Magnesium: 1.5 mg/dL — ABNORMAL LOW (ref 1.7–2.4)

## 2016-01-02 LAB — CORTISOL: CORTISOL PLASMA: 14.8 ug/dL

## 2016-01-02 MED ORDER — DEXTROSE 50 % IV SOLN
INTRAVENOUS | Status: AC
  Administered 2016-01-02: 50 mL
  Filled 2016-01-02: qty 50

## 2016-01-02 MED ORDER — DEXTROSE 50 % IV SOLN
25.0000 mL | Freq: Once | INTRAVENOUS | Status: AC
  Administered 2016-01-02: 25 mL via INTRAVENOUS
  Filled 2016-01-02: qty 50

## 2016-01-02 MED ORDER — POTASSIUM CHLORIDE 10 MEQ/100ML IV SOLN
10.0000 meq | INTRAVENOUS | Status: AC
  Administered 2016-01-02 (×6): 10 meq via INTRAVENOUS
  Filled 2016-01-02 (×5): qty 100

## 2016-01-02 MED ORDER — DEXTROSE 50 % IV SOLN
INTRAVENOUS | Status: AC
  Filled 2016-01-02: qty 50

## 2016-01-02 MED ORDER — DEXTROSE 10 % IV SOLN
INTRAVENOUS | Status: DC
  Administered 2016-01-02 – 2016-01-05 (×4): via INTRAVENOUS

## 2016-01-02 MED ORDER — DEXTROSE 50 % IV SOLN
1.0000 | Freq: Once | INTRAVENOUS | Status: AC
  Administered 2016-01-02: 50 mL via INTRAVENOUS

## 2016-01-02 MED ORDER — MAGNESIUM SULFATE 2 GM/50ML IV SOLN
2.0000 g | Freq: Once | INTRAVENOUS | Status: AC
  Administered 2016-01-02: 2 g via INTRAVENOUS
  Filled 2016-01-02: qty 50

## 2016-01-02 MED ORDER — DEXTROSE 50 % IV SOLN
25.0000 mL | Freq: Once | INTRAVENOUS | Status: AC
  Administered 2016-01-02: 25 mL via INTRAVENOUS

## 2016-01-02 MED ORDER — DEXTROSE 50 % IV SOLN: 1.0000 | Freq: Once | INTRAVENOUS | Status: AC

## 2016-01-02 NOTE — Progress Notes (Signed)
Hypoglycemic Event  CBG: 12  Treatment: D50 IV 50 mL  Symptoms: None  Follow-up CBG: Time:10 CBG Result:309  Possible Reasons for Event: Inadequate meal intake  Comments/MD notified:Dr Margaretmary Lombard, Irineo Axon

## 2016-01-02 NOTE — Progress Notes (Signed)
Patient u/a results noted at 1800, positive for bacteria per lab report. Notified Dr. Ardyth Harps. Orders received for repeat urine culture. Stated would like foley catheter to remain in place at this time until her blood sugars are more stable and she is more alert. Nurse tech states pericare and foley care performed this morning, performed by RN this evening as well. Earnstine Regal, RN

## 2016-01-02 NOTE — Progress Notes (Addendum)
New IV access obtained via Ozzie Hoyle, RN Nursing Supervisor. Dextrose 10% at 50 ml/hr started with new IV access obtained. Portable CXR obtained. Rechecked CBG: 143. Pt alert and oriented to self and time. Daughter at bedside. Will continue to monitor. Earnstine Regal, RN Dr. Ardyth Harps was notified this afternoon of rechecked CBG: 143. Earnstine Regal, RN

## 2016-01-02 NOTE — Progress Notes (Signed)
Hypoglycemic Event  CBG: 34  Treatment: D50 IV 50 mL  Symptoms: None  Follow-up CBG: Time: 1415 CBG Result:34  Possible Reasons for Event: Unknown  Comments/MD notified:Dr. Ardyth Harps notified, came to see patient. Orders received for Dextrose 50 1 amp now and start IV fluids at Dextrose 10% at 50 ml/hr. Dextrose 50 1 amp given, IV site began oozing afterward. Flushed and good blood return noted. No pain or swelling to site. Notified Dr. Ardyth Harps. Nursing staff attempting new IV access. Will monitor. Earnstine Regal, RN  Riccardo Dubin

## 2016-01-02 NOTE — Progress Notes (Signed)
Hypoglycemic Event  CBG: 60  Treatment: D50 IV 25 mL  Symptoms: None  Follow-up CBG: Time: CBG Result:  Possible Reasons for Event: Unknown  Comments/MD notified: Text-paged Dr Ardyth Harps. Order received for dextrose 50 1/2 amp now. Earnstine Regal, RN  Riccardo Dubin

## 2016-01-02 NOTE — Progress Notes (Signed)
Lab tech attempted to draw blood for labs ordered for this afternoon. Needed to draw from left arm due to IV acces running in right hand. Pt unable to have IV fluids held for lab draw due to needing Dextrose 10% for hypoglycemia. pt and daughter stated she is to have no blood draws to left hand/arm due to having pacemaker placed per her cardiologist. Discussed with Dr. Ardyth Harps, stated okay to draw labs from left hand/arm if necessary. daughter aware. Lab tech was unable to draw any blood. Stated another lab tech will be in later this evening and can try again. Text-paged Dr.Hernandez to notify. Earnstine Regal, RN

## 2016-01-02 NOTE — Progress Notes (Signed)
Late entry for 1310 Patient had CBG 70. Notified Dr. Ardyth Harps. Stated to give snack of crackers and juice and recheck CBG at 1400. Earnstine Regal, RN

## 2016-01-02 NOTE — Progress Notes (Addendum)
[facility] PROGRESS NOTE  [name] XTG:[id number] DOB: 08/11/1931 DOA: 12/31/2015 PCP: Alice Reichert, MD  Assessment/Plan: Acute encephalopathy -Believed due to a combination of UTI but mostly profound hypoglycemia. -See below for details.  Type 2 diabetes with profound hypoglycemia -We'll discontinue metformin indefinitely at this point. -Continue D10 drip, Amps of D50 have been given when necessary.  -She continues to have profound hypoglycemia. Will check C-peptide level and sulfonylurea level to make sure there has not been any additional medications given that could cause her hypoglycemia. -Will request endocrinology consultation.  -Cortisol level is pending to rule out adrenal insufficiency although unlikely with hypokalemia. -We'll repeat UA and chest x-ray to scan for occult infection.  Hypertension  -Well-controlled, continue medications   Hypokalemia -Replete IV  Hypomagnesemia -Replete IV   Code Status: Full code Family Communication: Daughter [name] and son at bedside updated on plan of care and questions answered  Disposition Plan: Back to SNF when deemed stable.   Consultants:  Endocrinology   Antibiotics:  None   Subjective: Obtunded  Objective: Filed Vitals:   01/01/16 2159 01/02/16 0548 01/02/16 1042 01/02/16 1443  BP: 148/46 115/49  116/56  Pulse: 87 70  60  Temp: 97.5 F (36.4 C) 98.6 F (37 C)  98.2 F (36.8 C)  TempSrc: Oral Axillary    Resp: 20 18  18   Height:      Weight:      SpO2: 100% 94% 96% 94%    Intake/Output Summary (Last 24 hours) at 01/02/16 1455 Last data filed at 01/02/16 1300  Gross per 24 hour  Intake 1228.33 ml  Output   1175 ml  Net  53.33 ml   Filed Weights   12/31/15 0925 12/31/15 1522  Weight: 70.308 kg (155 lb) 69.8 kg (153 lb 14.1 oz)    Exam:   General:  Obtunded  Cardiovascular: Regular rate and rhythm  Respiratory: Clear to auscultation bilaterally  Abdomen: Soft,  nontender, nondistended, positive bowel sounds  Extremities: No clubbing, cyanosis or edema, positive pulses   Neurologic:  Unable to assess given current mental state  Data Reviewed: Basic Metabolic Panel:  Recent Labs Lab 12/31/15 0952 01/01/16 0658 01/02/16 0848  NA 137 137  --   K 3.4* 2.8*  --   CL 104 104  --   CO2 21* 24  --   GLUCOSE 108* 141*  --   BUN 16 11  --   CREATININE 0.71 0.58  --   CALCIUM 9.9 9.3  --   MG  --  1.3* 1.5*   Liver Function Tests:  Recent Labs Lab 12/31/15 0952  AST 25  ALT 8*  ALKPHOS 98  BILITOT 0.4  PROT 7.8  ALBUMIN 3.7   No results for input(s): LIPASE, AMYLASE in the last 168 hours. No results for input(s): AMMONIA in the last 168 hours. CBC:  Recent Labs Lab 12/31/15 0952 01/01/16 0658  WBC 12.2* 9.8  NEUTROABS 9.4*  --   HGB 13.2 12.0  HCT 39.3 36.2  MCV 84.9 85.2  PLT 389 349   Cardiac Enzymes: No results for input(s): CKTOTAL, CKMB, CKMBINDEX, TROPONINI in the last 168 hours. BNP (last 3 results)  Recent Labs  04/30/15 0040 05/09/15 1900 08/12/15 2018  BNP 81.0 83.7 89.0    ProBNP (last 3 results) No results for input(s): PROBNP in the last 8760 hours.  CBG:  Recent Labs Lab 01/02/16 0757 01/02/16 0829 01/02/16 1021 01/02/16 1125 01/02/16 1304  GLUCAP 225*  167* 110* 90 70    Recent Results (from the past 240 hour(s))  Urine culture     Status: None   Collection Time: 12/31/15  9:39 AM  Result Value Ref Range Status   Specimen Description URINE, CATHETERIZED  Final   Special Requests NONE  Final   Culture   Final    NO GROWTH 1 DAY Performed at [facility]    Report Status 01/01/2016 FINAL  Final  Blood Culture (routine x 2)     Status: None (Preliminary result)   Collection Time: 12/31/15  1:10 PM  Result Value Ref Range Status   Specimen Description BLOOD RIGHT ARM  Final   Special Requests   Final    BOTTLES DRAWN AEROBIC AND ANAEROBIC AEB=9CC ANA=12CC   Culture NO GROWTH  2 DAYS  Final   Report Status PENDING  Incomplete  Blood Culture (routine x 2)     Status: None (Preliminary result)   Collection Time: 12/31/15  1:30 PM  Result Value Ref Range Status   Specimen Description BLOOD RIGHT FOREARM  Final   Special Requests BOTTLES DRAWN AEROBIC AND ANAEROBIC 10CC EACH  Final   Culture NO GROWTH 2 DAYS  Final   Report Status PENDING  Incomplete  MRSA PCR Screening     Status: None   Collection Time: 12/31/15  7:30 PM  Result Value Ref Range Status   MRSA by PCR NEGATIVE NEGATIVE Final    Comment:        The GeneXpert MRSA Assay (FDA approved for NASAL specimens only), is one component of a comprehensive MRSA colonization surveillance program. It is not intended to diagnose MRSA infection nor to guide or monitor treatment for MRSA infections.      Studies: No results found.  Scheduled Meds: . allopurinol  200 mg Oral Daily  . carvedilol  6.25 mg Oral BID WC  . cholecalciferol  1,000 Units Oral Daily  . enoxaparin (LOVENOX) injection  40 mg Subcutaneous Q24H  . gabapentin  300 mg Oral TID  . magnesium oxide  400 mg Oral Daily  . multivitamin with minerals  1 tablet Oral Daily  . polyethylene glycol  17 g Oral Daily  . potassium chloride  10 mEq Intravenous Q1 Hr x 6   Continuous Infusions: . dextrose    . dextrose 5 % and 0.45% NaCl 50 mL/hr at 01/01/16 2141    Principal Problem:   Acute encephalopathy Active Problems:   Hypoglycemia   Hypertension   Protein-calorie malnutrition, moderate (HCC)    Time spent: 25 minutes. Greater than 50% of this time was spent in direct contact with the patient coordinating care.    Chaya Jan  [facility] Pager [phone]  If 7PM-7AM, please contact night-coverage at [email or web address], password [facility] 01/02/2016, 2:55 PM

## 2016-01-02 NOTE — Progress Notes (Signed)
Hypoglycemic Event  CBG: 57  Treatment: D50 IV 25 mL  Symptoms: None  Follow-up CBG: Time: CBG Result:69  Possible Reasons for Event: Unknown  Comments/MD notified:    Cipriano Mile

## 2016-01-03 LAB — CBC
HCT: 32.3 % — ABNORMAL LOW (ref 36.0–46.0)
Hemoglobin: 10.5 g/dL — ABNORMAL LOW (ref 12.0–15.0)
MCH: 28.1 pg (ref 26.0–34.0)
MCHC: 32.5 g/dL (ref 30.0–36.0)
MCV: 86.4 fL (ref 78.0–100.0)
PLATELETS: 347 10*3/uL (ref 150–400)
RBC: 3.74 MIL/uL — AB (ref 3.87–5.11)
RDW: 16.1 % — AB (ref 11.5–15.5)
WBC: 8.4 10*3/uL (ref 4.0–10.5)

## 2016-01-03 LAB — GLUCOSE, CAPILLARY
GLUCOSE-CAPILLARY: 111 mg/dL — AB (ref 65–99)
GLUCOSE-CAPILLARY: 120 mg/dL — AB (ref 65–99)
GLUCOSE-CAPILLARY: 125 mg/dL — AB (ref 65–99)
GLUCOSE-CAPILLARY: 127 mg/dL — AB (ref 65–99)
GLUCOSE-CAPILLARY: 80 mg/dL (ref 65–99)
GLUCOSE-CAPILLARY: 106 mg/dL — AB (ref 65–99)
Glucose-Capillary: 104 mg/dL — ABNORMAL HIGH (ref 65–99)
Glucose-Capillary: 106 mg/dL — ABNORMAL HIGH (ref 65–99)
Glucose-Capillary: 114 mg/dL — ABNORMAL HIGH (ref 65–99)
Glucose-Capillary: 137 mg/dL — ABNORMAL HIGH (ref 65–99)
Glucose-Capillary: 143 mg/dL — ABNORMAL HIGH (ref 65–99)
Glucose-Capillary: 91 mg/dL (ref 65–99)

## 2016-01-03 LAB — BASIC METABOLIC PANEL
ANION GAP: 6 (ref 5–15)
BUN: 10 mg/dL (ref 6–20)
CO2: 23 mmol/L (ref 22–32)
Calcium: 9.4 mg/dL (ref 8.9–10.3)
Chloride: 105 mmol/L (ref 101–111)
Creatinine, Ser: 0.47 mg/dL (ref 0.44–1.00)
GFR calc Af Amer: >60 mL/min (ref >60)
GLUCOSE: 126 mg/dL — AB (ref 65–99)
POTASSIUM: 5.4 mmol/L — AB (ref 3.5–5.1)
Sodium: 134 mmol/L — ABNORMAL LOW (ref 135–145)

## 2016-01-03 LAB — MAGNESIUM: Magnesium: 2.1 mg/dL (ref 1.7–2.4)

## 2016-01-03 MED ORDER — DICLOFENAC SODIUM 1 % TD GEL
2.0000 g | Freq: Four times a day (QID) | TRANSDERMAL | Status: DC
  Administered 2016-01-03 – 2016-01-06 (×12): 2 g via TOPICAL
  Filled 2016-01-03: qty 100

## 2016-01-03 MED ORDER — DICLOFENAC SODIUM 1 % TD GEL
TRANSDERMAL | Status: AC
  Filled 2016-01-03: qty 100

## 2016-01-03 MED ORDER — CIPROFLOXACIN IN D5W 400 MG/200ML IV SOLN
400.0000 mg | Freq: Two times a day (BID) | INTRAVENOUS | Status: DC
  Administered 2016-01-03 – 2016-01-05 (×4): 400 mg via INTRAVENOUS
  Filled 2016-01-03 (×4): qty 200

## 2016-01-03 NOTE — Progress Notes (Signed)
Pt's BP 103/57 and HR 60, 6.125 of Coreg due at this time.  Dr. Ardyth Harps paged and made aware.  New order to hold AM dose of Coreg.  Will continue to monitor.

## 2016-01-03 NOTE — Progress Notes (Signed)
0935 K+ 5.4 MD notified.

## 2016-01-03 NOTE — Progress Notes (Signed)
[facility] PROGRESS NOTE  [name] ZGY:[id number] DOB: Oct 17, 1931 DOA: 12/31/2015 PCP: Alice Reichert, MD  Assessment/Plan: Acute encephalopathy -Believed due to a combination of UTI but mostly profound hypoglycemia. -See below for details.  Type 2 diabetes with profound hypoglycemia -We'll discontinue metformin indefinitely at this point. -Continue D10 drip, Amps of D50 have been given when necessary.  -She continues to have profound hypoglycemia. Will check C-peptide level and sulfonylurea level to make sure there has not been any additional medications given that could cause her hypoglycemia. -Will request endocrinology consultation.  -Cortisol level ok at 14.8. -We'll repeat UA and chest x-ray to scan for occult infection.  UTI -Start cipro pending cx data. -DC foley.  Hypertension  -Well-controlled, continue medications   Hypokalemia -Repleted  Hypomagnesemia -Repleted   Code Status: Full code Family Communication: Son [name] at bedside updated on plan of care and questions answered  Disposition Plan: Back to SNF when deemed stable.   Consultants:  Endocrinology   Antibiotics:  None   Subjective: No complaints, feels well  Objective: Filed Vitals:   01/02/16 2021 01/03/16 0615 01/03/16 0754 01/03/16 0855  BP: 115/70 101/52 103/57   Pulse: 61 59 60   Temp: 98.6 F (37 C) 97.9 F (36.6 C)    TempSrc: Oral Oral    Resp: 20 18    Height:      Weight:      SpO2: 94% 99%  99%    Intake/Output Summary (Last 24 hours) at 01/03/16 1236 Last data filed at 01/03/16 5916  Gross per 24 hour  Intake 1525.83 ml  Output   1550 ml  Net -24.17 ml   Filed Weights   12/31/15 0925 12/31/15 1522  Weight: 70.308 kg (155 lb) 69.8 kg (153 lb 14.1 oz)    Exam:   General: Alert, awake, oriented 3  Cardiovascular: Regular rate and rhythm  Respiratory: Clear to auscultation bilaterally  Abdomen: Soft, nontender, nondistended, positive  bowel sounds  Extremities: No clubbing, cyanosis or edema, positive pulses   Neurologic:  Mostly intact and nonfocal  Data Reviewed: Basic Metabolic Panel:  Recent Labs Lab 12/31/15 0952 01/01/16 0658 01/02/16 0848 01/03/16 0808  NA 137 137  --  134*  K 3.4* 2.8*  --  5.4*  CL 104 104  --  105  CO2 21* 24  --  23  GLUCOSE 108* 141*  --  126*  BUN 16 11  --  10  CREATININE 0.71 0.58  --  0.47  CALCIUM 9.9 9.3  --  9.4  MG  --  1.3* 1.5* 2.1   Liver Function Tests:  Recent Labs Lab 12/31/15 0952  AST 25  ALT 8*  ALKPHOS 98  BILITOT 0.4  PROT 7.8  ALBUMIN 3.7   No results for input(s): LIPASE, AMYLASE in the last 168 hours. No results for input(s): AMMONIA in the last 168 hours. CBC:  Recent Labs Lab 12/31/15 0952 01/01/16 0658 01/03/16 0636  WBC 12.2* 9.8 8.4  NEUTROABS 9.4*  --   --   HGB 13.2 12.0 10.5*  HCT 39.3 36.2 32.3*  MCV 84.9 85.2 86.4  PLT 389 349 347   Cardiac Enzymes: No results for input(s): CKTOTAL, CKMB, CKMBINDEX, TROPONINI in the last 168 hours. BNP (last 3 results)  Recent Labs  04/30/15 0040 05/09/15 1900 08/12/15 2018  BNP 81.0 83.7 89.0    ProBNP (last 3 results) No results for input(s): PROBNP in the last 8760 hours.  CBG:  Recent Labs Lab 01/03/16 0242 01/03/16 0516 01/03/16 0617 01/03/16 0737 01/03/16 1007  GLUCAP 114* 111* 120* 125* 127*    Recent Results (from the past 240 hour(s))  Urine culture     Status: None   Collection Time: 12/31/15  9:39 AM  Result Value Ref Range Status   Specimen Description URINE, CATHETERIZED  Final   Special Requests NONE  Final   Culture   Final    NO GROWTH 1 DAY Performed at [facility]    Report Status 01/01/2016 FINAL  Final  Blood Culture (routine x 2)     Status: None (Preliminary result)   Collection Time: 12/31/15  1:10 PM  Result Value Ref Range Status   Specimen Description BLOOD RIGHT ARM  Final   Special Requests   Final    BOTTLES DRAWN AEROBIC  AND ANAEROBIC AEB=9CC ANA=12CC   Culture NO GROWTH 2 DAYS  Final   Report Status PENDING  Incomplete  Blood Culture (routine x 2)     Status: None (Preliminary result)   Collection Time: 12/31/15  1:30 PM  Result Value Ref Range Status   Specimen Description BLOOD RIGHT FOREARM  Final   Special Requests BOTTLES DRAWN AEROBIC AND ANAEROBIC 10CC EACH  Final   Culture NO GROWTH 2 DAYS  Final   Report Status PENDING  Incomplete  MRSA PCR Screening     Status: None   Collection Time: 12/31/15  7:30 PM  Result Value Ref Range Status   MRSA by PCR NEGATIVE NEGATIVE Final    Comment:        The GeneXpert MRSA Assay (FDA approved for NASAL specimens only), is one component of a comprehensive MRSA colonization surveillance program. It is not intended to diagnose MRSA infection nor to guide or monitor treatment for MRSA infections.      Studies: Dg Chest Port 1 View  01/02/2016  CLINICAL DATA:  Leukocytosis. History of CHF, diabetes, hypertension, bradycardia, pulmonary hypertension, pacemaker. Study limited by lack of patient cooperation due to patient's back pain. EXAM: PORTABLE CHEST 1 VIEW COMPARISON:  Chest x-ray dated 12/31/2015 FINDINGS: Cardiomediastinal silhouette appears stable in size and configuration. Left chest wall pacemaker/AICD stable in position. Study is hypoinspiratory with crowding of the perihilar bronchovascular markings. Given the low lung volumes, lungs appear clear. No evidence of pneumonia. No pleural effusion seen. Osseous structures about the chest are unremarkable. Degenerative changes again noted at each shoulder joint. IMPRESSION: Hypoinspiratory exam. No evidence of acute cardiopulmonary abnormality. Electronically Signed   By: Bary Richard M.D.   On: 01/02/2016 15:48    Scheduled Meds: . allopurinol  200 mg Oral Daily  . carvedilol  6.25 mg Oral BID WC  . cholecalciferol  1,000 Units Oral Daily  . enoxaparin (LOVENOX) injection  40 mg Subcutaneous Q24H  .  gabapentin  300 mg Oral TID  . magnesium oxide  400 mg Oral Daily  . multivitamin with minerals  1 tablet Oral Daily  . polyethylene glycol  17 g Oral Daily   Continuous Infusions: . dextrose 50 mL/hr at 01/02/16 1538    Principal Problem:   Acute encephalopathy Active Problems:   Hypoglycemia   Hypertension   Protein-calorie malnutrition, moderate (HCC)    Time spent: 25 minutes. Greater than 50% of this time was spent in direct contact with the patient coordinating care.    Chaya Jan  [facility] Pager [phone]  If 7PM-7AM, please contact night-coverage at [email or web address], password [facility] 01/03/2016, 12:36 PM  LOS:  1 day

## 2016-01-04 ENCOUNTER — Telehealth: Payer: Self-pay | Admitting: Internal Medicine

## 2016-01-04 LAB — GLUCOSE, CAPILLARY
GLUCOSE-CAPILLARY: 85 mg/dL (ref 65–99)
GLUCOSE-CAPILLARY: 101 mg/dL — AB (ref 65–99)
GLUCOSE-CAPILLARY: 85 mg/dL (ref 65–99)
GLUCOSE-CAPILLARY: 103 mg/dL — AB (ref 65–99)
GLUCOSE-CAPILLARY: 75 mg/dL (ref 65–99)
GLUCOSE-CAPILLARY: 124 mg/dL — AB (ref 65–99)
GLUCOSE-CAPILLARY: 91 mg/dL (ref 65–99)
Glucose-Capillary: 109 mg/dL — ABNORMAL HIGH (ref 65–99)
Glucose-Capillary: 89 mg/dL (ref 65–99)
Glucose-Capillary: 96 mg/dL (ref 65–99)
Glucose-Capillary: 97 mg/dL (ref 65–99)
Glucose-Capillary: 99 mg/dL (ref 65–99)

## 2016-01-04 LAB — C-PEPTIDE: C-Peptide: 4 ng/mL (ref 1.1–4.4)

## 2016-01-04 NOTE — Progress Notes (Signed)
[facility] PROGRESS NOTE  [name] YQM:[id number] DOB: 07/21/1931 DOA: 12/31/2015 PCP: Alice Reichert, MD  Assessment/Plan: Acute encephalopathy -Believed due to a combination of UTI but mostly profound hypoglycemia. -See below for details.  Type 2 diabetes with profound hypoglycemia -We'll discontinue metformin indefinitely at this point. -She continues to have profound hypoglycemia, although improved past 12 hours.  -She lost her IV and has not been receiving D10, CBGs have been between 75 and 99.  -C-peptide level and sulfonylurea level pending to make sure there has not been any additional medications given that could cause her hypoglycemia. -Will request endocrinology consultation to see if any further recommendations regarding her hypoglycemia. -Cortisol level ok at 14.8. -We'll repeat UA and chest x-ray to scan for occult infection.  UTI -Start cipro pending cx data. -DC foley.  Hypertension  -Well-controlled, continue medications   Hypokalemia -Repleted; now hyperkalemic, monitor.  Hypomagnesemia -Repleted   Code Status: Full code Family Communication: Daughter [name] at bedside updated on plan of care and questions answered  Disposition Plan: Back to SNF when deemed stable; hopefully in 24-48 hours   Consultants:  Endocrinology   Antibiotics:  Cipro  Subjective: No complaints, feels well, still appears a little confused  Objective: Filed Vitals:   01/03/16 2157 01/04/16 0456 01/04/16 0919 01/04/16 1440  BP: 130/45 125/75 126/101 124/78  Pulse: 69 81  84  Temp: 97.5 F (36.4 C) 97.1 F (36.2 C)  97.6 F (36.4 C)  TempSrc: Oral Oral  Oral  Resp: Height:      Weight:      SpO2: 99% 99%  99%    Intake/Output Summary (Last 24 hours) at 01/04/16 1445 Last data filed at 01/04/16 0700  Gross per 24 hour  Intake    240 ml  Output      0 ml  Net    240 ml   Filed Weights   12/31/15 0925 12/31/15 1522  Weight:  70.308 kg (155 lb) 69.8 kg (153 lb 14.1 oz)    Exam:   General: Alert, awake, oriented 3  Cardiovascular: Regular rate and rhythm  Respiratory: Clear to auscultation bilaterally  Abdomen: Soft, nontender, nondistended, positive bowel sounds  Extremities: No clubbing, cyanosis or edema, positive pulses   Neurologic:  Mostly intact and nonfocal  Data Reviewed: Basic Metabolic Panel:  Recent Labs Lab 12/31/15 0952 01/01/16 0658 01/02/16 0848 01/03/16 0808  NA 137 137  --  134*  K 3.4* 2.8*  --  5.4*  CL 104 104  --  105  CO2 21* 24  --  23  GLUCOSE 108* 141*  --  126*  BUN 16 11  --  10  CREATININE 0.71 0.58  --  0.47  CALCIUM 9.9 9.3  --  9.4  MG  --  1.3* 1.5* 2.1   Liver Function Tests:  Recent Labs Lab 12/31/15 0952  AST 25  ALT 8*  ALKPHOS 98  BILITOT 0.4  PROT 7.8  ALBUMIN 3.7   No results for input(s): LIPASE, AMYLASE in the last 168 hours. No results for input(s): AMMONIA in the last 168 hours. CBC:  Recent Labs Lab 12/31/15 0952 01/01/16 0658 01/03/16 0636  WBC 12.2* 9.8 8.4  NEUTROABS 9.4*  --   --   HGB 13.2 12.0 10.5*  HCT 39.3 36.2 32.3*  MCV 84.9 85.2 86.4  PLT 389 349 347   Cardiac Enzymes: No results for input(s): CKTOTAL, CKMB, CKMBINDEX, TROPONINI in the  last 168 hours. BNP (last 3 results)  Recent Labs  04/30/15 0040 05/09/15 1900 08/12/15 2018  BNP 81.0 83.7 89.0    ProBNP (last 3 results) No results for input(s): PROBNP in the last 8760 hours.  CBG:  Recent Labs Lab 01/04/16 0638 01/04/16 0720 01/04/16 0943 01/04/16 1213 01/04/16 1352  GLUCAP 99 85 103* 97 75    Recent Results (from the past 240 hour(s))  Urine culture     Status: None   Collection Time: 12/31/15  9:39 AM  Result Value Ref Range Status   Specimen Description URINE, CATHETERIZED  Final   Special Requests NONE  Final   Culture   Final    NO GROWTH 1 DAY Performed at [facility]    Report Status 01/01/2016 FINAL  Final    Blood Culture (routine x 2)     Status: None (Preliminary result)   Collection Time: 12/31/15  1:10 PM  Result Value Ref Range Status   Specimen Description BLOOD RIGHT ARM  Final   Special Requests   Final    BOTTLES DRAWN AEROBIC AND ANAEROBIC AEB=9CC ANA=12CC   Culture NO GROWTH 4 DAYS  Final   Report Status PENDING  Incomplete  Blood Culture (routine x 2)     Status: None (Preliminary result)   Collection Time: 12/31/15  1:30 PM  Result Value Ref Range Status   Specimen Description BLOOD RIGHT FOREARM  Final   Special Requests BOTTLES DRAWN AEROBIC AND ANAEROBIC 10CC EACH  Final   Culture NO GROWTH 4 DAYS  Final   Report Status PENDING  Incomplete  MRSA PCR Screening     Status: None   Collection Time: 12/31/15  7:30 PM  Result Value Ref Range Status   MRSA by PCR NEGATIVE NEGATIVE Final    Comment:        The GeneXpert MRSA Assay (FDA approved for NASAL specimens only), is one component of a comprehensive MRSA colonization surveillance program. It is not intended to diagnose MRSA infection nor to guide or monitor treatment for MRSA infections.   Culture, Urine     Status: None (Preliminary result)   Collection Time: 01/02/16  6:50 PM  Result Value Ref Range Status   Specimen Description URINE, CATHETERIZED  Final   Special Requests NONE  Final   Culture   Final    >=100,000 COLONIES/mL GRAM NEGATIVE RODS Performed at [facility]    Report Status PENDING  Incomplete     Studies: Dg Chest Port 1 View  01/02/2016  CLINICAL DATA:  Leukocytosis. History of CHF, diabetes, hypertension, bradycardia, pulmonary hypertension, pacemaker. Study limited by lack of patient cooperation due to patient's back pain. EXAM: PORTABLE CHEST 1 VIEW COMPARISON:  Chest x-ray dated 12/31/2015 FINDINGS: Cardiomediastinal silhouette appears stable in size and configuration. Left chest wall pacemaker/AICD stable in position. Study is hypoinspiratory with crowding of the perihilar  bronchovascular markings. Given the low lung volumes, lungs appear clear. No evidence of pneumonia. No pleural effusion seen. Osseous structures about the chest are unremarkable. Degenerative changes again noted at each shoulder joint. IMPRESSION: Hypoinspiratory exam. No evidence of acute cardiopulmonary abnormality. Electronically Signed   By: Bary Richard M.D.   On: 01/02/2016 15:48    Scheduled Meds: . allopurinol  200 mg Oral Daily  . carvedilol  6.25 mg Oral BID WC  . cholecalciferol  1,000 Units Oral Daily  . ciprofloxacin  400 mg Intravenous Q12H  . diclofenac sodium  2 g Topical QID  .  enoxaparin (LOVENOX) injection  40 mg Subcutaneous Q24H  . gabapentin  300 mg Oral TID  . magnesium oxide  400 mg Oral Daily  . multivitamin with minerals  1 tablet Oral Daily  . polyethylene glycol  17 g Oral Daily   Continuous Infusions: . dextrose 50 mL/hr at 01/03/16 1445    Principal Problem:   Acute encephalopathy Active Problems:   Hypoglycemia   Hypertension   Protein-calorie malnutrition, moderate (HCC)    Time spent: 25 minutes. Greater than 50% of this time was spent in direct contact with the patient coordinating care.    Chaya Jan  [facility] Pager [phone]  If 7PM-7AM, please contact night-coverage at [email or web address], password [facility] 01/04/2016, 2:45 PM  LOS: 2 days

## 2016-01-04 NOTE — Progress Notes (Signed)
Patient will not allow [place] to restart IV, she needs dextrose in order to keep her glucose up, she has been hypoglycemic since admission. Paged on call MD to make them aware, will follow any new orders received and continue to monitor the patient.

## 2016-01-04 NOTE — Consult Note (Signed)
Subjective:    Patient ID: [name], female    DOB: [birth year]. Patient is being seen in consultation for hypoglycemia.   Past Medical History  Diagnosis Date  . Edema     . EF 65-70%, echo, November 29, 2010  . Pulmonary hypertension (HCC)      46 mmHg... echo... January, 2012  . Cellulitis     Superficial cellulitis right lower leg   January 14, 2011  . AV block      2:1  January 14, 2011 / pacemaker plan when cellulitis resolved in length  . Bradycardia     2:1  AV block  . Hypokalemia     October, 2012  . Ejection fraction     EF 65%, echo, January, 2012  . Murmur     No significant valvular disease by echo, January, 2012  . Pacemaker     January, 2013 [employer] Sensa  . Chronic diastolic CHF (congestive heart failure) (HCC)   . Hypertension   . Arthritis     "qwhere"  . Diabetes mellitus without complication [facility])    Past Surgical History  Procedure Laterality Date  . Hip arthroplasty Right   . Partial hysterectomy      vaginal  . Cholecystectomy    . Insert / replace / remove pacemaker  11/2011  . Permanent pacemaker insertion N/A 12/09/2011    Procedure: PERMANENT PACEMAKER INSERTION;  Surgeon: Marinus Maw, MD;  Location: [facility] CATH LAB;  Service: Cardiovascular;  Laterality: N/A;   Social History   Social History  . Marital Status: Widowed    Spouse Name: N/A  . Number of Children: N/A  . Years of Education: N/A   Occupational History  . Retired    Social History Main Topics  . Smoking status: Never Smoker   . Smokeless tobacco: Never Used  . Alcohol Use: No  . Drug Use: No  . Sexual Activity: No   Other Topics Concern  . None   Social History Narrative   Retired    Widowed    Tobacco Use - No.    Alcohol Use - no   Regular Exercise - no   Drug Use - no   @ ALLERGIES: Allergies  Allergen Reactions  . Aspirin Rash and Other (See Comments)    GI distress  . Tramadol Other (See Comments)    GI distress   VACCINATION  STATUS: Immunization History  Administered Date(s) Administered  . PPD Test 04/25/2014    HPI 82 year old female patient with  multiple medical problems  as above. She is a nursing home resident.  The patient presents with a medical history as above.  From her medication list she was on metformin likely prescribed for diabetes even though patient denies history of diabetes. Her last A1c in the electronic medical records was 6.4%. She is a nursing home resident. Prior to admission, she was found on the floor in nursing home without loss of consciousness. -She was found to have hypoglycemia at 26 which was improved to 165 after D50 intervention. She was also found to have UTI confirmed subsequently on urine culture. -She was found to have confusion, delirium , which prompted CT scan of her brain which was unremarkable for acute change. -She required continuous infusion of D10 on the floor until last night since when she maintained normal glycemia with increasing oral intake off of metformin. -No reported fever or chills prior to admission. She did have reported poor oral intake  for several weeks prior to this admission.  Review of Systems   With the exception of systems mention in the history of present illness, Review of system is difficult to elicit in this patient who is still recovering from delirium.  Objective:    BP 124/78 mmHg  Pulse 84  Temp(Src) 97.6 F (36.4 C) (Oral)  Resp 18  Ht 5\' 1"  (1.549 m)  Wt 153 lb 14.1 oz (69.8 kg)  BMI 29.09 kg/m2  SpO2 99%  Wt Readings from Last 3 Encounters:  12/31/15 153 lb 14.1 oz (69.8 kg)  08/13/15 157 lb 8 oz (71.442 kg)  05/09/15 168 lb 14 oz (76.6 kg)    Physical Exam General: Awake, she is oriented to person, place, not to time.  HEENT: Normocephalic, atraumatic, pupils equal round and reactive to light, moist mucous membranes Neck: Supple, no JVD, no lymphadenopathy, no goiter Cardiovascular: Regular rate and rhythm, no  murmurs, rubs or gallops Lungs: Clear to auscultation bilaterally Abdomen: Obese, soft, nontender, nondistended, positive bowel sounds Extremities: Trace bilateral pitting edema, contracted left lower extremity bilateral heel boots in place Neurologic:  Limited movement of extremities, unable to fully assess given current mental state   CMP     Component Value Date/Time   NA 134* 01/03/2016 0808   K 5.4* 01/03/2016 0808   CL 105 01/03/2016 0808   CO2 23 01/03/2016 0808   GLUCOSE 126* 01/03/2016 0808   BUN 10 01/03/2016 0808   CREATININE 0.47 01/03/2016 0808   CALCIUM 9.4 01/03/2016 0808   PROT 7.8 12/31/2015 0952   ALBUMIN 3.7 12/31/2015 0952   AST 25 12/31/2015 0952   ALT 8* 12/31/2015 0952   ALKPHOS 98 12/31/2015 0952   BILITOT 0.4 12/31/2015 0952   GFRNONAA >60 01/03/2016 0808   GFRAA >60 01/03/2016 0808   Diabetic Labs (most recent): Lab Results  Component Value Date   HGBA1C 6.4* 12/31/2015    Assessment & Plan:   Hypoglycemia UTI Delirium  - Per review of her records, she has diabetes and has been on metformin treatment. Given her recent A1c of 6.4%, it is reasonable to assume it is well controlled diabetes.  Even though metformin is unlikely to be the cause of hypoglycemia, I agree with plan to discontinue metformin. The cause of her hypoglycemia seems to be multifactorial including poor nutritional intake coupled with UTI with possible systemic infection. -After a course of dextrose support she seems to maintain normal glycemia for the last 12 hours at least. -Continue to encourage oral nutrition, continue monitoring blood glucose every 4 hours. Use hypoglycemia protocol if she drops below 70 mg per DL. -If she continues to have significant hypoglycemia despite these corrective measures, she will be considered for hypoglycemia workup including for endogenous insulin producing neoplasm. Suspicion for this latter differential is low at this point. -I will reassess  her tomorrow with clinical evaluation.    Marquis Lunch, MD Phone: [phone]  Fax: [phone]   01/04/2016, 4:48 PM

## 2016-01-04 NOTE — Progress Notes (Signed)
Called patients son [name] Hoyland to make them aware that patient is not allowing [place] to get IV access, to see if someone familiar with patient can convince her to allow [place] to gain IV access. Will continue to monitor patient.

## 2016-01-04 NOTE — Telephone Encounter (Signed)
[email or web address]//sss 

## 2016-01-04 NOTE — Care Management Note (Signed)
Case Management Note  Patient Details  Name: [name] MRN: [id number] Date of Birth: [birth year]  Subjective/Objective:                  Pt admitted with acute encephalopathy. Pt is from [facility]. Anticipate pt will return to [facility]. CSW is aware and will arrange for return to facility when appropriate.   Action/Plan: No CM needs.   Expected Discharge Date:  01/02/16               Expected Discharge Plan:  Skilled Nursing Facility  In-House Referral:  Clinical Social Work  Discharge planning Services  CM Consult  Post Acute Care Choice:  NA Choice offered to:  NA  DME Arranged:    DME Agency:     HH Arranged:    HH Agency:     Status of Service:  Completed, signed off  Medicare Important Message Given:    Date Medicare IM Given:    Medicare IM give by:    Date Additional Medicare IM Given:    Additional Medicare Important Message give by:     If discussed at Long Length of Stay Meetings, dates discussed:    Additional Comments:  Malcolm Metro, RN 01/04/2016, 2:55 PM

## 2016-01-04 NOTE — Telephone Encounter (Signed)
Follow up      Pt is in the hosp.  She will not let anyone put an IV in her left arm because she has a pacemaker.  They have a question to ask someone in device.  Please call

## 2016-01-04 NOTE — Telephone Encounter (Signed)
New message  Pt is in the hospital and the daughter is requesting a call back to discuss if it is NOT ok to have an IV in the left arm near the pacer. Please call back to discuss.

## 2016-01-05 LAB — CULTURE, BLOOD (ROUTINE X 2)
CULTURE: NO GROWTH
Culture: NO GROWTH

## 2016-01-05 LAB — GLUCOSE, CAPILLARY
GLUCOSE-CAPILLARY: 122 mg/dL — AB (ref 65–99)
GLUCOSE-CAPILLARY: 154 mg/dL — AB (ref 65–99)
Glucose-Capillary: 82 mg/dL (ref 65–99)
Glucose-Capillary: 139 mg/dL — ABNORMAL HIGH (ref 65–99)
Glucose-Capillary: 122 mg/dL — ABNORMAL HIGH (ref 65–99)
Glucose-Capillary: 96 mg/dL (ref 65–99)
Glucose-Capillary: 115 mg/dL — ABNORMAL HIGH (ref 65–99)

## 2016-01-05 MED ORDER — CIPROFLOXACIN HCL 250 MG PO TABS
500.0000 mg | ORAL_TABLET | Freq: Two times a day (BID) | ORAL | Status: DC
Start: 2016-01-05 — End: 2016-01-06
  Administered 2016-01-05 – 2016-01-06 (×3): 500 mg via ORAL
  Filled 2016-01-05 (×3): qty 2

## 2016-01-05 NOTE — Progress Notes (Signed)
Subjective:    Patient ID: [name], female    DOB: Nov 22, 1930, PCP Alice Reichert, MD   Past Medical History  Diagnosis Date  . Edema     . EF 65-70%, echo, November 29, 2010  . Pulmonary hypertension (HCC)      46 mmHg... echo... January, 2012  . Cellulitis     Superficial cellulitis right lower leg   January 14, 2011  . AV block      2:1  January 14, 2011 / pacemaker plan when cellulitis resolved in length  . Bradycardia     2:1  AV block  . Hypokalemia     October, 2012  . Ejection fraction     EF 65%, echo, January, 2012  . Murmur     No significant valvular disease by echo, January, 2012  . Pacemaker     January, 2013 Med[employer] Sensa  . Chronic diastolic CHF (congestive heart failure) (HCC)   . Hypertension   . Arthritis     "qwhere"  . Diabetes mellitus without complication [facility])    Past Surgical History  Procedure Laterality Date  . Hip arthroplasty Right   . Partial hysterectomy      vaginal  . Cholecystectomy    . Insert / replace / remove pacemaker  11/2011  . Permanent pacemaker insertion N/A 12/09/2011    Procedure: PERMANENT PACEMAKER INSERTION;  Surgeon: Marinus Maw, MD;  Location: [facility] CATH LAB;  Service: Cardiovascular;  Laterality: N/A;   Social History   Social History  . Marital Status: Widowed    Spouse Name: N/A  . Number of Children: N/A  . Years of Education: N/A   Occupational History  . Retired    Social History Main Topics  . Smoking status: Never Smoker   . Smokeless tobacco: Never Used  . Alcohol Use: No  . Drug Use: No  . Sexual Activity: No   Other Topics Concern  . None   Social History Narrative   Retired    Widowed    Tobacco Use - No.    Alcohol Use - no   Regular Exercise - no   Drug Use - no   @ ALLERGIES: Allergies  Allergen Reactions  . Aspirin Rash and Other (See Comments)    GI distress  . Tramadol Other (See Comments)    GI distress   VACCINATION STATUS: Immunization History   Administered Date(s) Administered  . PPD Test 04/25/2014    HPI She is being followed for hypoglycemia. Overnight events noted. She did not have  further hypoglycemia. She is consuming 25-50% of her meals.  Review of Systems With the exception of systems mention in the history of present illness, Review of system is difficult to elicit in this patient who is still recovering from delirium. Objective:    BP 126/59 mmHg  Pulse 78  Temp(Src) 98.7 F (37.1 C) (Oral)  Resp 20  Ht  (1.549 m)  Wt 153 lb 14.1 oz (69.8 kg)  BMI 29.09 kg/m2  SpO2 100%  Wt Readings from Last 3 Encounters:  12/31/15 153 lb 14.1 oz (69.8 kg)  08/13/15 157 lb 8 oz (71.442 kg)  05/09/15 168 lb 14 oz (76.6 kg)    Physical Exam HEENT: Normocephalic, atraumatic, pupils equal round and reactive to light, moist mucous membranes Neck: Supple, no JVD, no lymphadenopathy, no goiter Cardiovascular: Regular rate and rhythm, no murmurs, rubs or gallops Lungs: Clear to auscultation bilaterally Abdomen: Obese, soft, nontender,  nondistended, positive bowel sounds Extremities: Trace bilateral pitting edema, contracted left lower extremity bilateral heel boots in place Neurologic: Limited movement of extremities, unable to fully assess given current mental state  CMP     Component Value Date/Time   NA 134* 01/03/2016 0808   K 5.4* 01/03/2016 0808   CL 105 01/03/2016 0808   CO2 23 01/03/2016 0808   GLUCOSE 126* 01/03/2016 0808   BUN 10 01/03/2016 0808   CREATININE 0.47 01/03/2016 0808   CALCIUM 9.4 01/03/2016 0808   PROT 7.8 12/31/2015 0952   ALBUMIN 3.7 12/31/2015 0952   AST 25 12/31/2015 0952   ALT 8* 12/31/2015 0952   ALKPHOS 98 12/31/2015 0952   BILITOT 0.4 12/31/2015 0952   GFRNONAA >60 01/03/2016 0808   GFRAA >60 01/03/2016 0808     Diabetic Labs (most recent): Lab Results  Component Value Date   HGBA1C 6.4* 12/31/2015     Assessment & Plan:  Hypoglycemia UTI Delirium -she did not  have any further hypoglycemia over 24 hours. She is increasing her oral intake. Continue on one -on-one meal time assistance.  - Per review of her records, she has diabetes and has been on metformin treatment. Given her recent A1c of 6.4%, it is reasonable to assume it is well controlled diabetes. Even though metformin is unlikely to be the cause of hypoglycemia, I agree with plan to discontinue metformin. The cause of her hypoglycemia seems to be multifactorial including poor nutritional intake coupled with UTI with possible systemic infection. -she will not need work up for hypoglycemia for now. if she is ready for discharge make sure she is not discharged on metformin on any diabetes medication. I will like to see her in clinic in 1 week.  Marquis Lunch, MD Phone: [phone]  Fax: [phone]   01/05/2016, 9:13 PM

## 2016-01-05 NOTE — Progress Notes (Signed)
[facility] PROGRESS NOTE  [name] ZOX:[id number] DOB: 1931-08-25 DOA: 12/31/2015 PCP: Alice Reichert, MD  Assessment/Plan: Acute encephalopathy -Believed due to a combination of UTI but mostly profound hypoglycemia. -See below for details.  Type 2 diabetes with profound hypoglycemia -We'll discontinue metformin indefinitely at this point. -CBGs have been stable on D10 drip, will plan on discontinuing drip today to see if she can maintain her CBG without glucose. -C-peptide level and sulfonylurea level pending to make sure there has not been any additional medications given that could cause her hypoglycemia. -Appreciate endocrinology input and recommendations. -Cortisol level ok at 14.8.  UTI -Start cipro pending cx data. (As of today greater than 100,000 gram-negative rods) -DC foley.  Hypertension  -Well-controlled, continue medications   Hypokalemia -Repleted; now hyperkalemic, monitor. -Recheck labs in a.m.  Hypomagnesemia -Repleted   Code Status: Full code Family Communication: Daughter [name] at bedside 2/13 updated on plan of care and questions answered  Disposition Plan: Back to SNF when deemed stable; hopefully in 24 hours   Consultants:  Endocrinology   Antibiotics:  Cipro  Subjective: No complaints, feels well, still appears a little confused  Objective: Filed Vitals:   01/04/16 1803 01/04/16 2133 01/05/16 0536 01/05/16 1303  BP: 129/55 101/66 127/85 126/59  Pulse: 87 86 96 78  Temp:  98.3 F (36.8 C) 97.8 F (36.6 C) 98.7 F (37.1 C)  TempSrc:  Oral Oral Oral  Resp:  20 20 20   Height:      Weight:      SpO2:  100% 100% 100%    Intake/Output Summary (Last 24 hours) at 01/05/16 1536 Last data filed at 01/05/16 1305  Gross per 24 hour  Intake   1635 ml  Output      0 ml  Net   1635 ml   Filed Weights   12/31/15 0925 12/31/15 1522  Weight: 70.308 kg (155 lb) 69.8 kg (153 lb 14.1 oz)    Exam:   General: Alert,  awake, oriented 3  Cardiovascular: Regular rate and rhythm  Respiratory: Clear to auscultation bilaterally  Abdomen: Soft, nontender, nondistended, positive bowel sounds  Extremities: No clubbing, cyanosis or edema, positive pulses   Neurologic:  Mostly intact and nonfocal  Data Reviewed: Basic Metabolic Panel:  Recent Labs Lab 12/31/15 0952 01/01/16 0658 01/02/16 0848 01/03/16 0808  NA 137 137  --  134*  K 3.4* 2.8*  --  5.4*  CL 104 104  --  105  CO2 21* 24  --  23  GLUCOSE 108* 141*  --  126*  BUN 16 11  --  10  CREATININE 0.71 0.58  --  0.47  CALCIUM 9.9 9.3  --  9.4  MG  --  1.3* 1.5* 2.1   Liver Function Tests:  Recent Labs Lab 12/31/15 0952  AST 25  ALT 8*  ALKPHOS 98  BILITOT 0.4  PROT 7.8  ALBUMIN 3.7   No results for input(s): LIPASE, AMYLASE in the last 168 hours. No results for input(s): AMMONIA in the last 168 hours. CBC:  Recent Labs Lab 12/31/15 0952 01/01/16 0658 01/03/16 0636  WBC 12.2* 9.8 8.4  NEUTROABS 9.4*  --   --   HGB 13.2 12.0 10.5*  HCT 39.3 36.2 32.3*  MCV 84.9 85.2 86.4  PLT 389 349 347   Cardiac Enzymes: No results for input(s): CKTOTAL, CKMB, CKMBINDEX, TROPONINI in the last 168 hours. BNP (last 3 results)  Recent Labs  04/30/15 0040 05/09/15 1900  08/12/15 2018  BNP 81.0 83.7 89.0    ProBNP (last 3 results) No results for input(s): PROBNP in the last 8760 hours.  CBG:  Recent Labs Lab 01/05/16 0214 01/05/16 0412 01/05/16 0606 01/05/16 0748 01/05/16 1104  GLUCAP 82 139* 122* 122* 154*    Recent Results (from the past 240 hour(s))  Urine culture     Status: None   Collection Time: 12/31/15  9:39 AM  Result Value Ref Range Status   Specimen Description URINE, CATHETERIZED  Final   Special Requests NONE  Final   Culture   Final    NO GROWTH 1 DAY Performed at [facility]    Report Status 01/01/2016 FINAL  Final  Blood Culture (routine x 2)     Status: None   Collection Time: 12/31/15   1:10 PM  Result Value Ref Range Status   Specimen Description BLOOD RIGHT ARM  Final   Special Requests   Final    BOTTLES DRAWN AEROBIC AND ANAEROBIC AEB=9CC ANA=12CC   Culture NO GROWTH 5 DAYS  Final   Report Status 01/05/2016 FINAL  Final  Blood Culture (routine x 2)     Status: None   Collection Time: 12/31/15  1:30 PM  Result Value Ref Range Status   Specimen Description BLOOD RIGHT FOREARM  Final   Special Requests BOTTLES DRAWN AEROBIC AND ANAEROBIC 10CC EACH  Final   Culture NO GROWTH 5 DAYS  Final   Report Status 01/05/2016 FINAL  Final  MRSA PCR Screening     Status: None   Collection Time: 12/31/15  7:30 PM  Result Value Ref Range Status   MRSA by PCR NEGATIVE NEGATIVE Final    Comment:        The GeneXpert MRSA Assay (FDA approved for NASAL specimens only), is one component of a comprehensive MRSA colonization surveillance program. It is not intended to diagnose MRSA infection nor to guide or monitor treatment for MRSA infections.   Culture, Urine     Status: None (Preliminary result)   Collection Time: 01/02/16  6:50 PM  Result Value Ref Range Status   Specimen Description URINE, CATHETERIZED  Final   Special Requests NONE  Final   Culture   Final    >=100,000 COLONIES/mL GRAM NEGATIVE RODS CULTURE REINCUBATED FOR BETTER GROWTH Performed at [facility]    Report Status PENDING  Incomplete     Studies: No results found.  Scheduled Meds: . allopurinol  200 mg Oral Daily  . carvedilol  6.25 mg Oral BID WC  . cholecalciferol  1,000 Units Oral Daily  . ciprofloxacin  500 mg Oral BID  . diclofenac sodium  2 g Topical QID  . enoxaparin (LOVENOX) injection  40 mg Subcutaneous Q24H  . gabapentin  300 mg Oral TID  . magnesium oxide  400 mg Oral Daily  . multivitamin with minerals  1 tablet Oral Daily  . polyethylene glycol  17 g Oral Daily   Continuous Infusions:    Principal Problem:   Acute encephalopathy Active Problems:   Hypoglycemia    Hypertension   Protein-calorie malnutrition, moderate (HCC)    Time spent: 25 minutes. Greater than 50% of this time was spent in direct contact with the patient coordinating care.    Chaya Jan  [facility] Pager [phone]  If 7PM-7AM, please contact night-coverage at [email or web address], password [facility] 01/05/2016, 3:36 PM  LOS: 3 days

## 2016-01-05 NOTE — NC FL2 (Signed)
[place] MEDICAID FL2 LEVEL OF CARE SCREENING TOOL     IDENTIFICATION  Patient Name: [name] Birthdate: Jan 22, 1931 Sex: female Admission Date (Current Location): 12/31/2015  [facility] and [place] Number:  [employer] and Address:  [facility],  [address] [place] [address]      Provider Number: [id number]  Attending Physician Name and Address:  Micael Hampshire Acost*  Relative Name and Phone Number:       Current Level of Care: Hospital Recommended Level of Care: Skilled Nursing Facility Prior Approval Number:    Date Approved/Denied:   PASRR Number:    Discharge Plan: SNF    Current Diagnoses: Patient Active Problem List   Diagnosis Date Noted  . Acute encephalopathy 12/31/2015  . Hypoglycemia 12/31/2015  . Chest pain 08/12/2015  . Chronic edema 04/30/2015  . Elevated troponin 04/30/2015  . Peripheral edema 04/30/2015  . Nocturnal leg cramps 04/06/2015  . Pressure in chest 01/13/2015  . Generalized weakness 08/06/2014  . Protein-calorie malnutrition, moderate (HCC) 08/06/2014  . Osteoarthritis 04/29/2014  . Anxiety 04/28/2014  . Neck pain 04/28/2014  . Weakness 04/21/2014  . FTT (failure to thrive) in adult 04/21/2014  . Chronic diastolic CHF (congestive heart failure) (HCC)   . Pacemaker   . Hypertension 12/10/2011  . Cardiac pacemaker 12/10/2011  . Murmur   . Hypokalemia   . Ejection fraction   . Edema   . AV block   . Cellulitis   . Pulmonary hypertension (HCC)     Orientation RESPIRATION BLADDER Height & Weight     Self    Continent Weight: 153 lb 14.1 oz (69.8 kg) Height:   (154.9 cm)  BEHAVIORAL SYMPTOMS/MOOD NEUROLOGICAL BOWEL NUTRITION STATUS      Continent Diet (Heart Healthy)  AMBULATORY STATUS COMMUNICATION OF NEEDS Skin   Limited Assist Verbally Normal                       Personal Care Assistance Level of Assistance  Bathing, Dressing Bathing Assistance: Limited assistance    Dressing Assistance: Limited assistance     Functional Limitations Info             SPECIAL CARE FACTORS FREQUENCY  PT (By licensed PT), OT (By licensed OT)                    Contractures      Additional Factors Info  Code Status               Current Medications (01/05/2016):  This is the current hospital active medication list Current Facility-Administered Medications  Medication Dose Route Frequency Provider Last Rate Last Dose  . acetaminophen (TYLENOL) tablet 650 mg  650 mg Oral Q6H PRN Henderson Cloud, MD   650 mg at 01/04/16 2023   Or  . acetaminophen (TYLENOL) suppository 650 mg  650 mg Rectal Q6H PRN Henderson Cloud, MD      . allopurinol (ZYLOPRIM) tablet 200 mg  200 mg Oral Daily Henderson Cloud, MD   200 mg at 01/05/16 0845  . carvedilol (COREG) tablet 6.25 mg  6.25 mg Oral BID WC Henderson Cloud, MD   6.25 mg at 01/05/16 0845  . cholecalciferol (VITAMIN D) tablet 1,000 Units  1,000 Units Oral Daily Henderson Cloud, MD   1,000 Units at 01/05/16 0845  . ciprofloxacin (CIPRO) tablet 500 mg  500 mg Oral  BID Henderson Cloud, MD      . dextrose 10 % infusion   Intravenous Continuous Henderson Cloud, MD 50 mL/hr at 01/05/16 [phone]    . diazepam (VALIUM) tablet 2.5 mg  2.5 mg Oral BID PRN Henderson Cloud, MD   2.5 mg at 01/04/16 1610  . diclofenac sodium (VOLTAREN) 1 % transdermal gel 2 g  2 g Topical QID Henderson Cloud, MD   2 g at 01/05/16 0845  . enoxaparin (LOVENOX) injection 40 mg  40 mg Subcutaneous Q24H Henderson Cloud, MD   40 mg at 01/04/16 1801  . gabapentin (NEURONTIN) capsule 300 mg  300 mg Oral TID Henderson Cloud, MD   300 mg at 01/05/16 0845  . magnesium oxide (MAG-OX) tablet 400 mg  400 mg Oral Daily Henderson Cloud, MD   400 mg at 01/05/16 0845  . multivitamin with minerals tablet 1 tablet  1 tablet Oral Daily Henderson Cloud, MD   1 tablet at 01/05/16 0845  . nitroGLYCERIN (NITROSTAT) SL tablet 0.4 mg  0.4 mg Sublingual Q5 min PRN Henderson Cloud, MD      . ondansetron [facility]) tablet 4 mg  4 mg Oral Q6H PRN Henderson Cloud, MD       Or  . ondansetron [facility]) injection 4 mg  4 mg Intravenous Q6H PRN Henderson Cloud, MD      . polyethylene glycol (MIRALAX / GLYCOLAX) packet 17 g  17 g Oral Daily Henderson Cloud, MD   17 g at 01/05/16 0844  . senna-docusate (Senokot-S) tablet 1 tablet  1 tablet Oral QHS PRN Henderson Cloud, MD         Discharge Medications: Please see discharge summary for a list of discharge medications.  Relevant Imaging Results:  Relevant Lab Results:   Additional Information SSN [id number]  Liliana Cline, LCSW

## 2016-01-06 DIAGNOSIS — G894 Chronic pain syndrome: Secondary | ICD-10-CM | POA: Diagnosis not present

## 2016-01-06 DIAGNOSIS — J181 Lobar pneumonia, unspecified organism: Secondary | ICD-10-CM | POA: Diagnosis not present

## 2016-01-06 DIAGNOSIS — M109 Gout, unspecified: Secondary | ICD-10-CM | POA: Diagnosis not present

## 2016-01-06 DIAGNOSIS — R011 Cardiac murmur, unspecified: Secondary | ICD-10-CM | POA: Diagnosis not present

## 2016-01-06 DIAGNOSIS — E162 Hypoglycemia, unspecified: Secondary | ICD-10-CM | POA: Diagnosis not present

## 2016-01-06 DIAGNOSIS — R1319 Other dysphagia: Secondary | ICD-10-CM | POA: Diagnosis not present

## 2016-01-06 DIAGNOSIS — F419 Anxiety disorder, unspecified: Secondary | ICD-10-CM | POA: Diagnosis not present

## 2016-01-06 DIAGNOSIS — R609 Edema, unspecified: Secondary | ICD-10-CM | POA: Diagnosis not present

## 2016-01-06 DIAGNOSIS — E119 Type 2 diabetes mellitus without complications: Secondary | ICD-10-CM | POA: Diagnosis not present

## 2016-01-06 DIAGNOSIS — I10 Essential (primary) hypertension: Secondary | ICD-10-CM

## 2016-01-06 DIAGNOSIS — Z79899 Other long term (current) drug therapy: Secondary | ICD-10-CM | POA: Diagnosis not present

## 2016-01-06 DIAGNOSIS — R278 Other lack of coordination: Secondary | ICD-10-CM | POA: Diagnosis not present

## 2016-01-06 DIAGNOSIS — N390 Urinary tract infection, site not specified: Secondary | ICD-10-CM | POA: Diagnosis not present

## 2016-01-06 DIAGNOSIS — N200 Calculus of kidney: Secondary | ICD-10-CM | POA: Diagnosis not present

## 2016-01-06 DIAGNOSIS — G8929 Other chronic pain: Secondary | ICD-10-CM | POA: Diagnosis not present

## 2016-01-06 DIAGNOSIS — M545 Low back pain: Secondary | ICD-10-CM | POA: Diagnosis not present

## 2016-01-06 DIAGNOSIS — I501 Left ventricular failure: Secondary | ICD-10-CM | POA: Diagnosis not present

## 2016-01-06 DIAGNOSIS — I4430 Unspecified atrioventricular block: Secondary | ICD-10-CM | POA: Diagnosis not present

## 2016-01-06 DIAGNOSIS — R293 Abnormal posture: Secondary | ICD-10-CM | POA: Diagnosis not present

## 2016-01-06 DIAGNOSIS — R627 Adult failure to thrive: Secondary | ICD-10-CM | POA: Diagnosis not present

## 2016-01-06 DIAGNOSIS — R0789 Other chest pain: Secondary | ICD-10-CM | POA: Diagnosis not present

## 2016-01-06 DIAGNOSIS — Z950 Presence of cardiac pacemaker: Secondary | ICD-10-CM | POA: Diagnosis not present

## 2016-01-06 DIAGNOSIS — M1000 Idiopathic gout, unspecified site: Secondary | ICD-10-CM | POA: Diagnosis not present

## 2016-01-06 DIAGNOSIS — M1990 Unspecified osteoarthritis, unspecified site: Secondary | ICD-10-CM | POA: Diagnosis not present

## 2016-01-06 DIAGNOSIS — R41841 Cognitive communication deficit: Secondary | ICD-10-CM | POA: Diagnosis not present

## 2016-01-06 DIAGNOSIS — M6281 Muscle weakness (generalized): Secondary | ICD-10-CM | POA: Diagnosis not present

## 2016-01-06 DIAGNOSIS — I5030 Unspecified diastolic (congestive) heart failure: Secondary | ICD-10-CM | POA: Diagnosis not present

## 2016-01-06 DIAGNOSIS — E559 Vitamin D deficiency, unspecified: Secondary | ICD-10-CM | POA: Diagnosis not present

## 2016-01-06 DIAGNOSIS — I270 Primary pulmonary hypertension: Secondary | ICD-10-CM | POA: Diagnosis not present

## 2016-01-06 DIAGNOSIS — E46 Unspecified protein-calorie malnutrition: Secondary | ICD-10-CM | POA: Diagnosis not present

## 2016-01-06 DIAGNOSIS — E876 Hypokalemia: Secondary | ICD-10-CM | POA: Diagnosis not present

## 2016-01-06 DIAGNOSIS — G4762 Sleep related leg cramps: Secondary | ICD-10-CM | POA: Diagnosis not present

## 2016-01-06 DIAGNOSIS — G9340 Encephalopathy, unspecified: Secondary | ICD-10-CM | POA: Diagnosis not present

## 2016-01-06 DIAGNOSIS — R531 Weakness: Secondary | ICD-10-CM | POA: Diagnosis not present

## 2016-01-06 LAB — CBC
HEMATOCRIT: 27.9 % — AB (ref 36.0–46.0)
HEMOGLOBIN: 9.4 g/dL — AB (ref 12.0–15.0)
MCH: 29.1 pg (ref 26.0–34.0)
MCHC: 33.7 g/dL (ref 30.0–36.0)
MCV: 86.4 fL (ref 78.0–100.0)
Platelets: 312 10*3/uL (ref 150–400)
RBC: 3.23 MIL/uL — AB (ref 3.87–5.11)
RDW: 16.1 % — ABNORMAL HIGH (ref 11.5–15.5)
WBC: 5.3 10*3/uL (ref 4.0–10.5)

## 2016-01-06 LAB — GLUCOSE, CAPILLARY
GLUCOSE-CAPILLARY: 90 mg/dL (ref 65–99)
GLUCOSE-CAPILLARY: 113 mg/dL — AB (ref 65–99)
GLUCOSE-CAPILLARY: 91 mg/dL (ref 65–99)

## 2016-01-06 LAB — BASIC METABOLIC PANEL
ANION GAP: 9 (ref 5–15)
BUN: 11 mg/dL (ref 6–20)
CALCIUM: 9.4 mg/dL (ref 8.9–10.3)
CO2: 25 mmol/L (ref 22–32)
Chloride: 106 mmol/L (ref 101–111)
Creatinine, Ser: 0.44 mg/dL (ref 0.44–1.00)
Glucose, Bld: 91 mg/dL (ref 65–99)
POTASSIUM: 4.1 mmol/L (ref 3.5–5.1)
SODIUM: 140 mmol/L (ref 135–145)

## 2016-01-06 NOTE — NC FL2 (Signed)
[place] MEDICAID FL2 LEVEL OF CARE SCREENING TOOL     IDENTIFICATION  Patient Name: [name] Birthdate: 03-24-1931 Sex: female Admission Date (Current Location): 12/31/2015  [facility] and [place] Number:  [employer] and Address:  [facility],  [address] [place] [address]      Provider Number: [id number]  Attending Physician Name and Address:  [place] Siren, MD  Relative Name and Phone Number:       Current Level of Care: Hospital Recommended Level of Care: Skilled Nursing Facility Prior Approval Number:  [id number])  Date Approved/Denied:   PASRR Number:    Discharge Plan: SNF    Current Diagnoses: Patient Active Problem List   Diagnosis Date Noted  . Acute encephalopathy 12/31/2015  . Hypoglycemia 12/31/2015  . Chest pain 08/12/2015  . Chronic edema 04/30/2015  . Elevated troponin 04/30/2015  . Peripheral edema 04/30/2015  . Nocturnal leg cramps 04/06/2015  . Pressure in chest 01/13/2015  . Generalized weakness 08/06/2014  . Protein-calorie malnutrition, moderate (HCC) 08/06/2014  . Osteoarthritis 04/29/2014  . Anxiety 04/28/2014  . Neck pain 04/28/2014  . Weakness 04/21/2014  . FTT (failure to thrive) in adult 04/21/2014  . Chronic diastolic CHF (congestive heart failure) (HCC)   . Pacemaker   . Hypertension 12/10/2011  . Cardiac pacemaker 12/10/2011  . Murmur   . Hypokalemia   . Ejection fraction   . Edema   . AV block   . Cellulitis   . Pulmonary hypertension (HCC)     Orientation RESPIRATION BLADDER Height & Weight     Self  Normal Incontinent Weight: 153 lb 14.1 oz (69.8 kg) Height:   (154.9 cm)  BEHAVIORAL SYMPTOMS/MOOD NEUROLOGICAL BOWEL NUTRITION STATUS      Incontinent  (Low sodium, heart healthy)  AMBULATORY STATUS COMMUNICATION OF NEEDS Skin   Extensive Assist Verbally Normal                       Personal Care Assistance Level of Assistance  Bathing, Dressing Bathing Assistance: Maximum  assistance   Dressing Assistance: Maximum assistance     Functional Limitations Info             SPECIAL CARE FACTORS FREQUENCY  PT (By licensed PT), OT (By licensed OT)                    Contractures      Additional Factors Info  Psychotropic Code Status Info:  (Full) Allergies Info:  (Aspirin, Tramadol) Psychotropic Info:  (Valium)         Current Medications (01/06/2016):  This is the current hospital active medication list Current Facility-Administered Medications  Medication Dose Route Frequency Provider Last Rate Last Dose  . acetaminophen (TYLENOL) tablet 650 mg  650 mg Oral Q6H PRN Henderson Cloud, MD   650 mg at 01/06/16 7846   Or  . acetaminophen (TYLENOL) suppository 650 mg  650 mg Rectal Q6H PRN Henderson Cloud, MD      . allopurinol (ZYLOPRIM) tablet 200 mg  200 mg Oral Daily Henderson Cloud, MD   200 mg at 01/06/16 0916  . carvedilol (COREG) tablet 6.25 mg  6.25 mg Oral BID WC Henderson Cloud, MD   6.25 mg at 01/06/16 0916  . cholecalciferol (VITAMIN D) tablet 1,000 Units  1,000 Units Oral Daily Henderson Cloud, MD   1,000 Units at 01/06/16 [phone]  . ciprofloxacin (CIPRO)  tablet 500 mg  500 mg Oral BID Henderson Cloud, MD   500 mg at 01/06/16 0981  . diazepam (VALIUM) tablet 2.5 mg  2.5 mg Oral BID PRN Henderson Cloud, MD   2.5 mg at 01/06/16 0157  . diclofenac sodium (VOLTAREN) 1 % transdermal gel 2 g  2 g Topical QID Henderson Cloud, MD   2 g at 01/06/16 [phone]  . enoxaparin (LOVENOX) injection 40 mg  40 mg Subcutaneous Q24H Henderson Cloud, MD   40 mg at 01/05/16 1839  . gabapentin (NEURONTIN) capsule 300 mg  300 mg Oral TID Henderson Cloud, MD   300 mg at 01/06/16 0917  . magnesium oxide (MAG-OX) tablet 400 mg  400 mg Oral Daily Henderson Cloud, MD   400 mg at 01/06/16 7829  . multivitamin with minerals tablet 1 tablet  1 tablet Oral Daily Henderson Cloud, MD   1 tablet at 01/06/16 5621  . nitroGLYCERIN (NITROSTAT) SL tablet 0.4 mg  0.4 mg Sublingual Q5 min PRN Henderson Cloud, MD      . ondansetron [facility]) tablet 4 mg  4 mg Oral Q6H PRN Henderson Cloud, MD       Or  . ondansetron [facility]) injection 4 mg  4 mg Intravenous Q6H PRN Henderson Cloud, MD      . polyethylene glycol (MIRALAX / GLYCOLAX) packet 17 g  17 g Oral Daily Henderson Cloud, MD   17 g at 01/06/16 0916  . senna-docusate (Senokot-S) tablet 1 tablet  1 tablet Oral QHS PRN Henderson Cloud, MD         Discharge Medications: Please see discharge summary for a list of discharge medications.  Relevant Imaging Results:  Relevant Lab Results:   Additional Information SSN [id number]  Annice Needy, LCSW

## 2016-01-06 NOTE — Care Management Important Message (Signed)
Important Message  Patient Details  Name: [name] MRN: [id number] Date of Birth: [birth year]   Medicare Important Message Given:  Yes    Adonis Huguenin, RN 01/06/2016, 3:53 PM

## 2016-01-06 NOTE — Discharge Summary (Addendum)
Physician Discharge Summary  [name] JXB:[id number] DOB: 07/18/1931 DOA: 12/31/2015  PCP: [name] Reichert, MD  Admit date: 12/31/2015 Discharge date: 01/06/2016  Time spent: 35 minutes  Recommendations for Outpatient Follow-up:  1. Follow up with PCP in one week.     Discharge Diagnoses:  Principal Problem:   Acute encephalopathy Active Problems:   Hypertension   Protein-calorie malnutrition, moderate (HCC)   Hypoglycemia   Discharge Condition: improved, but is still confused.   Diet recommendation: Avoid sweets.   Filed Weights   12/31/15 0925 12/31/15 1522  Weight: 70.308 kg (155 lb) 69.8 kg (153 lb 14.1 oz)    History of present illness: Patient was admitted by Dr [name] on Feb 9th, 2017 for altered mental status due to hypoglycemia and UTI.[name]  As per her H and P:  " Patient is an 82 year old woman who comes from skilled nursing facility after being found on the floor, she did not appear to be unresponsive and was in fact calling out for help. She was transferred to the hospital for evaluation. On Sunday she was found to have a UTI and was started on Augmentin, today is day 4 of treatment. On arrival to the ED she was found to have increased delirium, CT scan of the head did not show evidence of bleed, urinalysis was not markedly positive for infection, she was complaining of some suprapubic pain and because of elevated postvoid residuals foley catheter was placed at which time she was noted to have a prolapsed bladder. No sign of infection is apparent. Lactic acid is slightly elevated at 2.4, admission was requested for observation and IV fluids. Subsequently upon arrival to the floor she was found to be markedly hypoglycemic with a CBG of 26 after 1 amp of D50 sugar was even lower at 18, after a second amp of D50 her CBG is now 165.   Hospital Course: Patient was given D 50, and her BS normalized.  She was subsequently given IV D 10, and after a day, it was discontinued.   Her Metformin was discontinued and should not be restarted.  She was also seen in consultation with endocrine, and Dr [name] agreed with management.  She was also tx with Cipro, and her urine culture grew GNR, ID still pending.   She had a 3 days course, and will not require further Tx at this time.  Please check culture to be sure it was not resistant to Cipro.   He can see her in one week if there is further problems.  She will now be discharged back to her SNF on no diabetic medication.  She will avoid concentrated sweets.  Thank you for allowing me to participate in her care.  Good Day.   Procedures: None.   Consultations:  Endocrine:  Dr [name].   Discharge Exam: Filed Vitals:   01/05/16 2147 01/06/16 0613  BP: 113/47 120/62  Pulse: 83 86  Temp: 98.2 F (36.8 C) 98.4 F (36.9 C)  Resp: 20 20    Discharge Instructions   Discharge Instructions    Diet - low sodium heart healthy    Complete by:  As directed      Schedule appointment    Complete by:  As directed   Follow up with PCP in one week.          Current Discharge Medication List    CONTINUE these medications which have NOT CHANGED   Details  allopurinol (ZYLOPRIM) 100 MG tablet Take 200  mg by mouth daily.    carvedilol (COREG) 6.25 MG tablet Take 6.25 mg by mouth 2 (two) times daily with a meal.    cholecalciferol (VITAMIN D) 1000 UNITS tablet Take 1,000 Units by mouth daily.    Cranberry 200 MG CAPS Take 1 capsule by mouth daily.    diazepam (VALIUM) 5 MG tablet Take 2.5 mg by mouth 2 (two) times daily as needed for anxiety or muscle spasms. For muscle spasms    furosemide (LASIX) 80 MG tablet Take 80 mg by mouth 2 (two) times daily.    gabapentin (NEURONTIN) 300 MG capsule Take 1 capsule (300 mg total) by mouth 3 (three) times daily. Qty: 60 capsule, Refills: 0    magnesium oxide (MAG-OX) 400 (241.3 MG) MG tablet TAKE ONE TABLET BY MOUTH ONE TIME DAILY Qty: 30 tablet, Refills: 4     Multiple Vitamin (MULTIVITAMIN WITH MINERALS) TABS tablet Take 1 tablet by mouth daily.    nitroGLYCERIN (NITROSTAT) 0.4 MG SL tablet Place 0.4 mg under the tongue every 5 (five) minutes as needed for chest pain.    polyethylene glycol powder (GLYCOLAX/MIRALAX) powder Take 17 g by mouth daily.      STOP taking these medications     acetaminophen-codeine (TYLENOL #3) 300-30 MG tablet      amoxicillin-clavulanate (AUGMENTIN) 875-125 MG tablet      HYDROcodone-acetaminophen (NORCO) 10-325 MG tablet      metFORMIN (GLUCOPHAGE) 500 MG tablet      metolazone (ZAROXOLYN) 2.5 MG tablet      potassium chloride SA (K-DUR,KLOR-CON) 20 MEQ tablet        Allergies  Allergen Reactions  . Aspirin Rash and Other (See Comments)    GI distress  . Tramadol Other (See Comments)    GI distress      The results of significant diagnostics from this hospitalization (including imaging, microbiology, ancillary and laboratory) are listed below for reference.    Significant Diagnostic Studies: Dg Chest 2 View  12/31/2015  CLINICAL DATA:  Altered mental status EXAM: CHEST  2 VIEW COMPARISON:  August 12, 2015 FINDINGS: There is no edema or consolidation. The heart size and pulmonary vascularity are normal. No adenopathy. Pacemaker leads are attached to the right atrium and right ventricle. There is extensive arthropathy in each shoulder. IMPRESSION: No edema or consolidation. Electronically Signed   By: Bretta Bang III M.D.   On: 12/31/2015 10:17   Ct Head Wo Contrast  12/31/2015  CLINICAL DATA:  Pain following fall EXAM: CT HEAD WITHOUT CONTRAST CT CERVICAL SPINE WITHOUT CONTRAST TECHNIQUE: Multidetector CT imaging of the head and cervical spine was performed following the standard protocol without intravenous contrast. Multiplanar CT image reconstructions of the cervical spine were also generated. COMPARISON:  CT head and CT cervical spine April 21, 2014 FINDINGS: CT HEAD FINDINGS Age related  volume loss is stable. There is no intracranial mass, hemorrhage, extra-axial fluid collection, or midline shift. There is slight small vessel disease in the centra semiovale bilaterally. Elsewhere gray-white compartments appear normal. No acute infarct evident. The bony calvarium appears intact. The mastoid air cells are clear. No intraorbital lesions are identified. CT CERVICAL SPINE FINDINGS There is no demonstrable fracture. There is stable slight retrolisthesis of C5 on C6 and slight stable retrolisthesis at C6 and C7 as well as slight anterolisthesis of C7 on T1, felt to be due to underlying spondylosis and stable. There is no new spondylolisthesis. The prevertebral soft tissues and predental space regions are normal. There is  marked disc space narrowing at all levels. There is ankylosis at C3-4, stable. There is facet hypertrophy to varying degrees at all levels bilaterally. There is exit foraminal narrowing at multiple levels due to bony hypertrophy. These changes are greatest at C4-5 on the right and C5-6 and C6-7 bilaterally. There is no high-grade stenosis. A previously noted 9 mm nodular lesion in the right lobe of the thyroid is stable. Thyroid appears mildly inhomogeneous but stable. IMPRESSION: CT head: Age related volume loss with slight periventricular small vessel disease. No intracranial mass, hemorrhage, or extra-axial fluid collection. No evidence of acute infarct. CT cervical spine: No demonstrable fracture. Areas of slight spondylolisthesis remain stable and are felt to be due to underlying spondylosis. There is extensive multilevel osteoarthritic change/spondylosis. No high-grade stenosis. Stable small nodular lesion right lobe of thyroid. Thyroid appears mildly inhomogeneous but stable overall. Electronically Signed   By: Bretta Bang III M.D.   On: 12/31/2015 10:57   Ct Cervical Spine Wo Contrast  12/31/2015  CLINICAL DATA:  Pain following fall EXAM: CT HEAD WITHOUT CONTRAST CT  CERVICAL SPINE WITHOUT CONTRAST TECHNIQUE: Multidetector CT imaging of the head and cervical spine was performed following the standard protocol without intravenous contrast. Multiplanar CT image reconstructions of the cervical spine were also generated. COMPARISON:  CT head and CT cervical spine April 21, 2014 FINDINGS: CT HEAD FINDINGS Age related volume loss is stable. There is no intracranial mass, hemorrhage, extra-axial fluid collection, or midline shift. There is slight small vessel disease in the centra semiovale bilaterally. Elsewhere gray-white compartments appear normal. No acute infarct evident. The bony calvarium appears intact. The mastoid air cells are clear. No intraorbital lesions are identified. CT CERVICAL SPINE FINDINGS There is no demonstrable fracture. There is stable slight retrolisthesis of C5 on C6 and slight stable retrolisthesis at C6 and C7 as well as slight anterolisthesis of C7 on T1, felt to be due to underlying spondylosis and stable. There is no new spondylolisthesis. The prevertebral soft tissues and predental space regions are normal. There is marked disc space narrowing at all levels. There is ankylosis at C3-4, stable. There is facet hypertrophy to varying degrees at all levels bilaterally. There is exit foraminal narrowing at multiple levels due to bony hypertrophy. These changes are greatest at C4-5 on the right and C5-6 and C6-7 bilaterally. There is no high-grade stenosis. A previously noted 9 mm nodular lesion in the right lobe of the thyroid is stable. Thyroid appears mildly inhomogeneous but stable. IMPRESSION: CT head: Age related volume loss with slight periventricular small vessel disease. No intracranial mass, hemorrhage, or extra-axial fluid collection. No evidence of acute infarct. CT cervical spine: No demonstrable fracture. Areas of slight spondylolisthesis remain stable and are felt to be due to underlying spondylosis. There is extensive multilevel osteoarthritic  change/spondylosis. No high-grade stenosis. Stable small nodular lesion right lobe of thyroid. Thyroid appears mildly inhomogeneous but stable overall. Electronically Signed   By: Bretta Bang III M.D.   On: 12/31/2015 10:57   Dg Chest Port 1 View  01/02/2016  CLINICAL DATA:  Leukocytosis. History of CHF, diabetes, hypertension, bradycardia, pulmonary hypertension, pacemaker. Study limited by lack of patient cooperation due to patient's back pain. EXAM: PORTABLE CHEST 1 VIEW COMPARISON:  Chest x-ray dated 12/31/2015 FINDINGS: Cardiomediastinal silhouette appears stable in size and configuration. Left chest wall pacemaker/AICD stable in position. Study is hypoinspiratory with crowding of the perihilar bronchovascular markings. Given the low lung volumes, lungs appear clear. No evidence of pneumonia. No pleural  effusion seen. Osseous structures about the chest are unremarkable. Degenerative changes again noted at each shoulder joint. IMPRESSION: Hypoinspiratory exam. No evidence of acute cardiopulmonary abnormality. Electronically Signed   By: Bary Richard M.D.   On: 01/02/2016 15:48    Microbiology: Recent Results (from the past 240 hour(s))  Urine culture     Status: None   Collection Time: 12/31/15  9:39 AM  Result Value Ref Range Status   Specimen Description URINE, CATHETERIZED  Final   Special Requests NONE  Final   Culture   Final    NO GROWTH 1 DAY Performed at [facility]    Report Status 01/01/2016 FINAL  Final  Blood Culture (routine x 2)     Status: None   Collection Time: 12/31/15  1:10 PM  Result Value Ref Range Status   Specimen Description BLOOD RIGHT ARM  Final   Special Requests   Final    BOTTLES DRAWN AEROBIC AND ANAEROBIC AEB=9CC ANA=12CC   Culture NO GROWTH 5 DAYS  Final   Report Status 01/05/2016 FINAL  Final  Blood Culture (routine x 2)     Status: None   Collection Time: 12/31/15  1:30 PM  Result Value Ref Range Status   Specimen Description BLOOD  RIGHT FOREARM  Final   Special Requests BOTTLES DRAWN AEROBIC AND ANAEROBIC 10CC EACH  Final   Culture NO GROWTH 5 DAYS  Final   Report Status 01/05/2016 FINAL  Final  MRSA PCR Screening     Status: None   Collection Time: 12/31/15  7:30 PM  Result Value Ref Range Status   MRSA by PCR NEGATIVE NEGATIVE Final    Comment:        The GeneXpert MRSA Assay (FDA approved for NASAL specimens only), is one component of a comprehensive MRSA colonization surveillance program. It is not intended to diagnose MRSA infection nor to guide or monitor treatment for MRSA infections.   Culture, Urine     Status: None (Preliminary result)   Collection Time: 01/02/16  6:50 PM  Result Value Ref Range Status   Specimen Description URINE, CATHETERIZED  Final   Special Requests NONE  Final   Culture   Final    >=100,000 COLONIES/mL GRAM NEGATIVE RODS CULTURE REINCUBATED FOR BETTER GROWTH Performed at [facility]    Report Status PENDING  Incomplete     Labs: Basic Metabolic Panel:  Recent Labs Lab 12/31/15 0952 01/01/16 0658 01/02/16 0848 01/03/16 0808 01/06/16 0625  NA 137 137  --  134* 140  K 3.4* 2.8*  --  5.4* 4.1  CL 104 104  --  105 106  CO2 21* 24  --  23 25  GLUCOSE 108* 141*  --  126* 91  BUN 16 11  --  10 11  CREATININE 0.71 0.58  --  0.47 0.44  CALCIUM 9.9 9.3  --  9.4 9.4  MG  --  1.3* 1.5* 2.1  --    Liver Function Tests:  Recent Labs Lab 12/31/15 0952  AST 25  ALT 8*  ALKPHOS 98  BILITOT 0.4  PROT 7.8  ALBUMIN 3.7   No results for input(s): LIPASE, AMYLASE in the last 168 hours. No results for input(s): AMMONIA in the last 168 hours. CBC:  Recent Labs Lab 12/31/15 0952 01/01/16 0658 01/03/16 0636 01/06/16 0625  WBC 12.2* 9.8 8.4 5.3  NEUTROABS 9.4*  --   --   --   HGB 13.2 12.0 10.5* 9.4*  HCT  39.3 36.2 32.3* 27.9*  MCV 84.9 85.2 86.4 86.4  PLT 389 349 347 312   CBG:  Recent Labs Lab 01/05/16 1104 01/05/16 1647 01/05/16 2146  01/06/16 0756 01/06/16 1143  GLUCAP 154* 96 115* 90 113*    Signed:  Chioma Mukherjee MD.  [facility] 01/06/2016, 12:55 PM

## 2016-01-06 NOTE — Clinical Social Work Note (Signed)
Clinical Social Work Assessment  Patient Details  Name: [name] MRN: [id number] Date of Birth: Jul 27, 1931  Date of referral:  01/06/16               Reason for consult:  Facility Placement                Permission sought to share information with:    Permission granted to share information::     Name::        Agency::     Relationship::     Contact Information:     Housing/Transportation Living arrangements for the past 2 months:  Skilled Building surveyor of Information:  Adult Children, Facility Patient Interpreter Needed:  None Criminal Activity/Legal Involvement Pertinent to Current Situation/Hospitalization:  No - Comment as needed Significant Relationships:  Adult Children Lives with:  Facility Resident Do you feel safe going back to the place where you live?  Yes Need for family participation in patient care:  Yes (Comment)  Care giving concerns:  None identified.    Social Worker assessment / plan:  CSW spoke with [place] at [facility] who identified that patient had been at the facility for several months, was extensive assist with ADLs, used at wheel chair and a [employer] for transfers.  CSW advised that patient was being discharged today and would return to the facility.  Tresa Endo advised that the facility would pick patient up. CSW spoke with patient's daughter, [name]. She confirmed Kelly's statements. CSW advised that patient was being discharged and that the facility would transport her at discharge.   CSW sent discharge clinicals via [employer]. CSW signing off.   Employment status:  Retired Health and safety inspector:  Medicare PT Recommendations:  Not assessed at this time Information / Referral to community resources:     Patient/Family's Response to care:  Family is agreeable for patient for patient to return to [facility].   Patient/Family's Understanding of and Emotional Response to Diagnosis, Current Treatment, and Prognosis: Family understands  patient's diagnosis, treatment and prognosis.    Emotional Assessment Appearance:  Appears stated age Attitude/Demeanor/Rapport:  Unable to Assess Affect (typically observed):  Unable to Assess Orientation:  Oriented to Self Alcohol / Substance use:  Not Applicable Psych involvement (Current and /or in the community):  No (Comment)  Discharge Needs  Concerns to be addressed:  Discharge Planning Concerns Readmission within the last 30 days:  Yes Current discharge risk:  None Barriers to Discharge:  No Barriers Identified   Annice Needy, LCSW 01/06/2016, 3:42 PM [phone]

## 2016-01-07 ENCOUNTER — Encounter: Payer: Medicare Other | Admitting: Interventional Cardiology

## 2016-01-07 LAB — URINE CULTURE

## 2016-01-09 LAB — SULFONYLUREA HYPOGLYCEMICS PANEL, SERUM
Acetohexamide: NEGATIVE ug/mL (ref 20–60)
CHLORPROPAMIDE: NEGATIVE ug/mL (ref 75–250)
GLIMEPIRIDE: NEGATIVE ng/mL (ref 80–250)
Glipizide: NEGATIVE ng/mL (ref 200–1000)
Glyburide: NEGATIVE ng/mL
Nateglinide: NEGATIVE ng/mL
REPAGLINIDE: NEGATIVE ng/mL
TOLAZAMIDE: NEGATIVE ug/mL
Tolbutamide: NEGATIVE ug/mL (ref 40–100)

## 2016-01-14 DIAGNOSIS — M1000 Idiopathic gout, unspecified site: Secondary | ICD-10-CM | POA: Diagnosis not present

## 2016-01-14 DIAGNOSIS — G8929 Other chronic pain: Secondary | ICD-10-CM | POA: Diagnosis not present

## 2016-01-14 DIAGNOSIS — G894 Chronic pain syndrome: Secondary | ICD-10-CM | POA: Diagnosis not present

## 2016-01-14 DIAGNOSIS — M545 Low back pain: Secondary | ICD-10-CM | POA: Diagnosis not present

## 2016-01-18 ENCOUNTER — Encounter: Payer: Medicare Other | Admitting: Internal Medicine

## 2016-01-19 DIAGNOSIS — E876 Hypokalemia: Secondary | ICD-10-CM | POA: Diagnosis not present

## 2016-01-19 DIAGNOSIS — I270 Primary pulmonary hypertension: Secondary | ICD-10-CM | POA: Diagnosis not present

## 2016-01-19 DIAGNOSIS — I501 Left ventricular failure: Secondary | ICD-10-CM | POA: Diagnosis not present

## 2016-01-19 DIAGNOSIS — R531 Weakness: Secondary | ICD-10-CM | POA: Diagnosis not present

## 2016-01-27 DIAGNOSIS — R531 Weakness: Secondary | ICD-10-CM | POA: Diagnosis not present

## 2016-01-27 DIAGNOSIS — I501 Left ventricular failure: Secondary | ICD-10-CM | POA: Diagnosis not present

## 2016-01-27 DIAGNOSIS — E876 Hypokalemia: Secondary | ICD-10-CM | POA: Diagnosis not present

## 2016-01-27 DIAGNOSIS — I270 Primary pulmonary hypertension: Secondary | ICD-10-CM | POA: Diagnosis not present

## 2016-02-10 ENCOUNTER — Encounter: Payer: Medicare Other | Admitting: Internal Medicine

## 2016-02-11 DIAGNOSIS — M1000 Idiopathic gout, unspecified site: Secondary | ICD-10-CM | POA: Diagnosis not present

## 2016-02-11 DIAGNOSIS — M545 Low back pain: Secondary | ICD-10-CM | POA: Diagnosis not present

## 2016-02-11 DIAGNOSIS — G894 Chronic pain syndrome: Secondary | ICD-10-CM | POA: Diagnosis not present

## 2016-02-11 DIAGNOSIS — G8929 Other chronic pain: Secondary | ICD-10-CM | POA: Diagnosis not present

## 2016-02-25 DIAGNOSIS — I270 Primary pulmonary hypertension: Secondary | ICD-10-CM | POA: Diagnosis not present

## 2016-02-25 DIAGNOSIS — R531 Weakness: Secondary | ICD-10-CM | POA: Diagnosis not present

## 2016-02-25 DIAGNOSIS — E876 Hypokalemia: Secondary | ICD-10-CM | POA: Diagnosis not present

## 2016-02-25 DIAGNOSIS — I501 Left ventricular failure: Secondary | ICD-10-CM | POA: Diagnosis not present

## 2016-02-26 DIAGNOSIS — R0989 Other specified symptoms and signs involving the circulatory and respiratory systems: Secondary | ICD-10-CM | POA: Diagnosis not present

## 2016-02-26 DIAGNOSIS — R05 Cough: Secondary | ICD-10-CM | POA: Diagnosis not present

## 2016-03-02 DIAGNOSIS — I270 Primary pulmonary hypertension: Secondary | ICD-10-CM | POA: Diagnosis not present

## 2016-03-02 DIAGNOSIS — R627 Adult failure to thrive: Secondary | ICD-10-CM | POA: Diagnosis not present

## 2016-03-02 DIAGNOSIS — G894 Chronic pain syndrome: Secondary | ICD-10-CM | POA: Diagnosis not present

## 2016-03-02 DIAGNOSIS — R278 Other lack of coordination: Secondary | ICD-10-CM | POA: Diagnosis not present

## 2016-03-02 DIAGNOSIS — M545 Low back pain: Secondary | ICD-10-CM | POA: Diagnosis not present

## 2016-03-02 DIAGNOSIS — E46 Unspecified protein-calorie malnutrition: Secondary | ICD-10-CM | POA: Diagnosis not present

## 2016-03-02 DIAGNOSIS — R531 Weakness: Secondary | ICD-10-CM | POA: Diagnosis not present

## 2016-03-02 DIAGNOSIS — E876 Hypokalemia: Secondary | ICD-10-CM | POA: Diagnosis not present

## 2016-03-02 DIAGNOSIS — G4762 Sleep related leg cramps: Secondary | ICD-10-CM | POA: Diagnosis not present

## 2016-03-02 DIAGNOSIS — G8929 Other chronic pain: Secondary | ICD-10-CM | POA: Diagnosis not present

## 2016-03-02 DIAGNOSIS — M1000 Idiopathic gout, unspecified site: Secondary | ICD-10-CM | POA: Diagnosis not present

## 2016-03-02 DIAGNOSIS — M1990 Unspecified osteoarthritis, unspecified site: Secondary | ICD-10-CM | POA: Diagnosis not present

## 2016-03-02 DIAGNOSIS — R011 Cardiac murmur, unspecified: Secondary | ICD-10-CM | POA: Diagnosis not present

## 2016-03-02 DIAGNOSIS — J181 Lobar pneumonia, unspecified organism: Secondary | ICD-10-CM | POA: Diagnosis not present

## 2016-03-02 DIAGNOSIS — E119 Type 2 diabetes mellitus without complications: Secondary | ICD-10-CM | POA: Diagnosis not present

## 2016-03-02 DIAGNOSIS — F419 Anxiety disorder, unspecified: Secondary | ICD-10-CM | POA: Diagnosis not present

## 2016-03-02 DIAGNOSIS — R0789 Other chest pain: Secondary | ICD-10-CM | POA: Diagnosis not present

## 2016-03-02 DIAGNOSIS — R41841 Cognitive communication deficit: Secondary | ICD-10-CM | POA: Diagnosis not present

## 2016-03-02 DIAGNOSIS — G9340 Encephalopathy, unspecified: Secondary | ICD-10-CM | POA: Diagnosis not present

## 2016-03-02 DIAGNOSIS — I509 Heart failure, unspecified: Secondary | ICD-10-CM | POA: Diagnosis not present

## 2016-03-02 DIAGNOSIS — I10 Essential (primary) hypertension: Secondary | ICD-10-CM | POA: Diagnosis not present

## 2016-03-02 DIAGNOSIS — E559 Vitamin D deficiency, unspecified: Secondary | ICD-10-CM | POA: Diagnosis not present

## 2016-03-02 DIAGNOSIS — I4430 Unspecified atrioventricular block: Secondary | ICD-10-CM | POA: Diagnosis not present

## 2016-03-02 DIAGNOSIS — I5030 Unspecified diastolic (congestive) heart failure: Secondary | ICD-10-CM | POA: Diagnosis not present

## 2016-03-02 DIAGNOSIS — Z950 Presence of cardiac pacemaker: Secondary | ICD-10-CM | POA: Diagnosis not present

## 2016-03-02 DIAGNOSIS — M109 Gout, unspecified: Secondary | ICD-10-CM | POA: Diagnosis not present

## 2016-03-02 DIAGNOSIS — I442 Atrioventricular block, complete: Secondary | ICD-10-CM | POA: Diagnosis not present

## 2016-03-02 DIAGNOSIS — M6281 Muscle weakness (generalized): Secondary | ICD-10-CM | POA: Diagnosis not present

## 2016-03-02 DIAGNOSIS — R609 Edema, unspecified: Secondary | ICD-10-CM | POA: Diagnosis not present

## 2016-03-02 DIAGNOSIS — R1319 Other dysphagia: Secondary | ICD-10-CM | POA: Diagnosis not present

## 2016-03-02 DIAGNOSIS — Z79899 Other long term (current) drug therapy: Secondary | ICD-10-CM | POA: Diagnosis not present

## 2016-03-02 DIAGNOSIS — E162 Hypoglycemia, unspecified: Secondary | ICD-10-CM | POA: Diagnosis not present

## 2016-03-02 DIAGNOSIS — R293 Abnormal posture: Secondary | ICD-10-CM | POA: Diagnosis not present

## 2016-03-10 DIAGNOSIS — M1000 Idiopathic gout, unspecified site: Secondary | ICD-10-CM | POA: Diagnosis not present

## 2016-03-10 DIAGNOSIS — M545 Low back pain: Secondary | ICD-10-CM | POA: Diagnosis not present

## 2016-03-10 DIAGNOSIS — G8929 Other chronic pain: Secondary | ICD-10-CM | POA: Diagnosis not present

## 2016-03-10 DIAGNOSIS — G894 Chronic pain syndrome: Secondary | ICD-10-CM | POA: Diagnosis not present

## 2016-03-16 ENCOUNTER — Encounter: Payer: Medicare Other | Admitting: Interventional Cardiology

## 2016-03-22 ENCOUNTER — Encounter: Payer: Self-pay | Admitting: Internal Medicine

## 2016-03-22 ENCOUNTER — Ambulatory Visit (INDEPENDENT_AMBULATORY_CARE_PROVIDER_SITE_OTHER): Payer: Medicare Other | Admitting: Internal Medicine

## 2016-03-22 VITALS — BP 136/70 | HR 89 | Ht 61.0 in | Wt 165.0 lb

## 2016-03-22 DIAGNOSIS — I442 Atrioventricular block, complete: Secondary | ICD-10-CM | POA: Diagnosis not present

## 2016-03-22 LAB — CUP PACEART INCLINIC DEVICE CHECK
Battery Impedance: 582 Ohm
Battery Remaining Longevity: 67 mo
Brady Statistic AP VP Percent: 2 %
Brady Statistic AS VP Percent: 97 %
Brady Statistic AS VS Percent: 1 %
Implantable Lead Implant Date: 20130118
Implantable Lead Location: 753859
Implantable Lead Location: 753860
Implantable Lead Model: 5076
Implantable Lead Model: 5076
Lead Channel Impedance Value: 328 Ohm
Lead Channel Impedance Value: 451 Ohm
Lead Channel Pacing Threshold Amplitude: 0.75 V
Lead Channel Sensing Intrinsic Amplitude: 1.4 mV
Lead Channel Setting Pacing Pulse Width: 0.4 ms
MDC IDC LEAD IMPLANT DT: 20130118
MDC IDC MSMT BATTERY VOLTAGE: 2.77 V
MDC IDC MSMT LEADCHNL RA PACING THRESHOLD PULSEWIDTH: 0.4 ms
MDC IDC MSMT LEADCHNL RV PACING THRESHOLD AMPLITUDE: 0.75 V
MDC IDC MSMT LEADCHNL RV PACING THRESHOLD PULSEWIDTH: 0.4 ms
MDC IDC SESS DTM: 20170502165103
MDC IDC SET LEADCHNL RA PACING AMPLITUDE: 2 V
MDC IDC SET LEADCHNL RV PACING AMPLITUDE: 2.5 V
MDC IDC SET LEADCHNL RV SENSING SENSITIVITY: 4 mV
MDC IDC STAT BRADY AP VS PERCENT: 0 %

## 2016-03-22 NOTE — Patient Instructions (Signed)

## 2016-03-22 NOTE — Progress Notes (Signed)
HPI Mrs. Warshawsky returns today for followup. She is a very pleasant 82 year old woman with a history of hypertension and symptomatic 2:1 heart block, now CHB, status post pacemaker insertion. She has a history of chronic lower extremity venous stasis and cellulitis and has developed severe cramps in her legs and is now unable to walk. She is bed bound. She has been able to keep her edema controlled.  Allergies  Allergen Reactions  . Aspirin Rash and Other (See Comments)    GI distress  . Tramadol Other (See Comments)    GI distress     Current Outpatient Prescriptions  Medication Sig Dispense Refill  . allopurinol (ZYLOPRIM) 100 MG tablet Take 200 mg by mouth daily.    . carvedilol (COREG) 6.25 MG tablet Take 6.25 mg by mouth 2 (two) times daily with a meal.    . cholecalciferol (VITAMIN D) 1000 UNITS tablet Take 1,000 Units by mouth daily.    . Cranberry 200 MG CAPS Take 1 capsule by mouth daily.    . diazepam (VALIUM) 5 MG tablet Take 2.5 mg by mouth 2 (two) times daily as needed for anxiety or muscle spasms. For muscle spasms    . furosemide (LASIX) 80 MG tablet Take 80 mg by mouth 2 (two) times daily.    [name] gabapentin (NEURONTIN) 300 MG capsule Take 1 capsule (300 mg total) by mouth 3 (three) times daily. 60 capsule 0  . magnesium oxide (MAG-OX) 400 (241.3 MG) MG tablet TAKE ONE TABLET BY MOUTH ONE TIME DAILY 30 tablet 4  . Multiple Vitamin (MULTIVITAMIN WITH MINERALS) TABS tablet Take 1 tablet by mouth daily.    . nitroGLYCERIN (NITROSTAT) 0.4 MG SL tablet Place 0.4 mg under the tongue every 5 (five) minutes as needed for chest pain.    . polyethylene glycol powder (GLYCOLAX/MIRALAX) powder Take 17 g by mouth daily.     No current facility-administered medications for this visit.     Past Medical History  Diagnosis Date  . Edema     . EF 65-70%, echo, November 29, 2010  . Pulmonary hypertension (HCC)      46 mmHg... echo... January, 2012  . Cellulitis     Superficial  cellulitis right lower leg   January 14, 2011  . AV block      2:1  January 14, 2011 / pacemaker plan when cellulitis resolved in length  . Bradycardia     2:1  AV block  . Hypokalemia     October, 2012  . Ejection fraction     EF 65%, echo, January, 2012  . Murmur     No significant valvular disease by echo, January, 2012  . Pacemaker     January, 2013 Medtronic Sensa  . Chronic diastolic CHF (congestive heart failure) (HCC)   . Hypertension   . Arthritis     "qwhere"  . Diabetes mellitus without complication (HCC)     ROS:   All systems reviewed and negative except as noted in the HPI.   Past Surgical History  Procedure Laterality Date  . Hip arthroplasty Right   . Partial hysterectomy      vaginal  . Cholecystectomy    . Insert / replace / remove pacemaker  11/2011  . Permanent pacemaker insertion N/A 12/09/2011    Procedure: PERMANENT PACEMAKER INSERTION;  Surgeon: Marinus Maw, MD;  Location: [facility] CATH LAB;  Service: Cardiovascular;  Laterality: N/A;     Family History  Problem Relation  Age of Onset  . Cardiomyopathy Father   . Diabetes Father   . Heart failure Father   . Cancer Son   . Coronary artery disease Son   . Dementia Mother      Social History   Social History  . Marital Status: Widowed    Spouse Name: N/A  . Number of Children: N/A  . Years of Education: N/A   Occupational History  . Retired    Social History Main Topics  . Smoking status: Never Smoker   . Smokeless tobacco: Never Used  . Alcohol Use: No  . Drug Use: No  . Sexual Activity: No   Other Topics Concern  . Not on file   Social History Narrative   Retired    Widowed    Tobacco Use - No.    Alcohol Use - no   Regular Exercise - no   Drug Use - no     BP 136/70 mmHg  Pulse 89  Ht  (1.549 m)  Wt 165 lb (74.844 kg)  BMI 31.19 kg/m2  SpO2 96%  Physical Exam:  stable appearing morbidly obese woman, NAD HEENT: Unremarkable Neck:  7 cm JVD, no  thyromegally Back:  No CVA tenderness Lungs:  Clear with no wheezes, rales, or rhonchi. HEART:  Regular rate rhythm, no murmurs, no rubs, no clicks, split S2. Abd:  soft, positive bowel sounds, no organomegally, no rebound, no guarding Ext:  2 plus pulses, trace peripheral edema, no cyanosis, no clubbing Skin:  No rashes no nodules Neuro:  CN II through XII intact, motor grossly intact  DEVICE  Normal device function.  See PaceArt for details.   Assess/Plan: 1. Complete heart block - she is stable, s/p PPM 2. HTN - her blood pressure has been reasonably well controlled. She is encouraged to maintain a low sodium diet. 3. PPM - her medtronic DDD PM is working normally. Will recheck in a year.   Leonia Reeves.D.

## 2016-03-28 DIAGNOSIS — I270 Primary pulmonary hypertension: Secondary | ICD-10-CM | POA: Diagnosis not present

## 2016-03-28 DIAGNOSIS — R531 Weakness: Secondary | ICD-10-CM | POA: Diagnosis not present

## 2016-03-28 DIAGNOSIS — E876 Hypokalemia: Secondary | ICD-10-CM | POA: Diagnosis not present

## 2016-03-28 DIAGNOSIS — I509 Heart failure, unspecified: Secondary | ICD-10-CM | POA: Diagnosis not present

## 2016-04-06 DIAGNOSIS — M1000 Idiopathic gout, unspecified site: Secondary | ICD-10-CM | POA: Diagnosis not present

## 2016-04-06 DIAGNOSIS — G8929 Other chronic pain: Secondary | ICD-10-CM | POA: Diagnosis not present

## 2016-04-06 DIAGNOSIS — M545 Low back pain: Secondary | ICD-10-CM | POA: Diagnosis not present

## 2016-04-06 DIAGNOSIS — G894 Chronic pain syndrome: Secondary | ICD-10-CM | POA: Diagnosis not present

## 2016-04-19 ENCOUNTER — Telehealth: Payer: Self-pay | Admitting: Interventional Cardiology

## 2016-04-19 NOTE — Telephone Encounter (Signed)
New Message  Pt daughter called to discuss the pt's heart medication.  Pt c/o medication issue:  1. Name of Medication: not sure but she says that it is 2.5   4. What is your medication issue? Request a call back to get the Dr. At the facility to administer it but she will need the name of it. Daughter states that she doesn't know the name of it but the nurse lynn is well aware and would like a call back to discuss this

## 2016-04-19 NOTE — Telephone Encounter (Signed)
Pt's daughter calling to say pt's weight up 16 lbs -she lives 4 hours away and doesn't know if there is any swelling-[facility] [facility] [place] [facility] [place] [facility]-pls call  [phone]

## 2016-04-19 NOTE — Telephone Encounter (Signed)
[name], the pts daughter, is requesting that I call the pt at her nursing home to explain why she no longer takes Metolazone as the pt is insisting that she is suppose to be taking.  I advised [name] that I will call her today.

## 2016-04-19 NOTE — Telephone Encounter (Signed)
**Note De-Identified [name] Obfuscation** The pts daughter, [name], is concerned about her Mothers cardiac health and is asking that I call [name] that the pt resides. She is concerned that the pt has gained weight.  I called [name] and spoke with a nurse named [name] who was more than helpful. She put me on hold while she walked to the pts room to visually evaluate the pt. When she came back to the phone she advised me that the pt has no swelling and is not SOB at this time. [name] does report that the pt has gained about 10 lbs through the month of May but feels that the weight is true body weight and not from fluid gain. [name] also advised me that they have a MD who is their Wellsite geologist and that he comes in to see the pts in the evenings a couple nights a week and that if [name] needs medical attention their MD would be notified and he will come see her there at the facility. I thanked [name] for her assistance.  I called the pts daughter, [name], and gave her a detailed update on the pt and explained that there is a MD that comes to see her Mother at [facility]. [name] expressed understanding and thanked me for my assistance.

## 2016-04-19 NOTE — Telephone Encounter (Signed)
**Note De-Identified [name] Obfuscation** I left a detailed message on the pts VM stating that Metolazone has been removed from her medication list and that she is not suppose to be taking it at this time. I also left this office's phone number for her to call me back if she has any questions or concerns.

## 2016-04-27 DIAGNOSIS — I509 Heart failure, unspecified: Secondary | ICD-10-CM | POA: Diagnosis not present

## 2016-04-27 DIAGNOSIS — R531 Weakness: Secondary | ICD-10-CM | POA: Diagnosis not present

## 2016-04-27 DIAGNOSIS — I270 Primary pulmonary hypertension: Secondary | ICD-10-CM | POA: Diagnosis not present

## 2016-04-27 DIAGNOSIS — E876 Hypokalemia: Secondary | ICD-10-CM | POA: Diagnosis not present

## 2016-05-02 DIAGNOSIS — M79671 Pain in right foot: Secondary | ICD-10-CM | POA: Diagnosis not present

## 2016-05-02 DIAGNOSIS — B351 Tinea unguium: Secondary | ICD-10-CM | POA: Diagnosis not present

## 2016-05-02 DIAGNOSIS — M79672 Pain in left foot: Secondary | ICD-10-CM | POA: Diagnosis not present

## 2016-05-02 DIAGNOSIS — E119 Type 2 diabetes mellitus without complications: Secondary | ICD-10-CM | POA: Diagnosis not present

## 2016-05-11 DIAGNOSIS — G9340 Encephalopathy, unspecified: Secondary | ICD-10-CM | POA: Diagnosis not present

## 2016-05-19 DIAGNOSIS — I10 Essential (primary) hypertension: Secondary | ICD-10-CM | POA: Diagnosis not present

## 2016-05-19 DIAGNOSIS — J9600 Acute respiratory failure, unspecified whether with hypoxia or hypercapnia: Secondary | ICD-10-CM | POA: Diagnosis not present

## 2016-05-19 DIAGNOSIS — R0989 Other specified symptoms and signs involving the circulatory and respiratory systems: Secondary | ICD-10-CM | POA: Diagnosis not present

## 2016-05-19 DIAGNOSIS — R062 Wheezing: Secondary | ICD-10-CM | POA: Diagnosis not present

## 2016-06-02 DIAGNOSIS — M1000 Idiopathic gout, unspecified site: Secondary | ICD-10-CM | POA: Diagnosis not present

## 2016-06-02 DIAGNOSIS — G894 Chronic pain syndrome: Secondary | ICD-10-CM | POA: Diagnosis not present

## 2016-06-02 DIAGNOSIS — M545 Low back pain: Secondary | ICD-10-CM | POA: Diagnosis not present

## 2016-06-02 DIAGNOSIS — G8929 Other chronic pain: Secondary | ICD-10-CM | POA: Diagnosis not present

## 2016-06-06 DIAGNOSIS — I270 Primary pulmonary hypertension: Secondary | ICD-10-CM | POA: Diagnosis not present

## 2016-06-06 DIAGNOSIS — I509 Heart failure, unspecified: Secondary | ICD-10-CM | POA: Diagnosis not present

## 2016-06-06 DIAGNOSIS — R531 Weakness: Secondary | ICD-10-CM | POA: Diagnosis not present

## 2016-06-06 DIAGNOSIS — E876 Hypokalemia: Secondary | ICD-10-CM | POA: Diagnosis not present

## 2016-06-07 ENCOUNTER — Ambulatory Visit (INDEPENDENT_AMBULATORY_CARE_PROVIDER_SITE_OTHER): Payer: Medicare Other | Admitting: Interventional Cardiology

## 2016-06-07 ENCOUNTER — Encounter: Payer: Self-pay | Admitting: Interventional Cardiology

## 2016-06-07 VITALS — BP 140/70 | HR 88 | Ht 61.0 in | Wt 186.0 lb

## 2016-06-07 DIAGNOSIS — Z950 Presence of cardiac pacemaker: Secondary | ICD-10-CM | POA: Diagnosis not present

## 2016-06-07 DIAGNOSIS — I5032 Chronic diastolic (congestive) heart failure: Secondary | ICD-10-CM

## 2016-06-07 DIAGNOSIS — R600 Localized edema: Secondary | ICD-10-CM

## 2016-06-07 NOTE — Patient Instructions (Signed)

## 2016-06-07 NOTE — Progress Notes (Signed)
Cardiology Office Note   Date:  06/07/2016   ID:  [name], DOB 13-Apr-1931, MRN [id number]  PCP:  Alice Reichert, MD    No chief complaint on file. chronic diastolic heart failure   Wt Readings from Last 3 Encounters:  06/07/16 186 lb (84.369 kg)  03/22/16 165 lb (74.844 kg)  12/31/15 153 lb 14.1 oz (69.8 kg)       History of Present Illness: [name] is a 82 y.o. female  Who has chronic diastolic heart failure.  SHe cannot stand so it is difficult to get daily weights.  She has had severe leg edema at times, but this has been better of late.  She takes Lasix twice a day.  SHe has not taken metolazone for some time. She denies any shortness of breath.  Her facility gives her medicines.  She thinks sometimes she does not get the meds on time and that causes some fluid retention.    Today, her edema is acceptable.  The son is in the room and he reports that this level of edema is quite good for her. They can get much worse. In the past, she has taken metolazone at those times.     Past Medical History  Diagnosis Date  . Edema     . EF 65-70%, echo, November 29, 2010  . Pulmonary hypertension (HCC)      46 mmHg... echo... January, 2012  . Cellulitis     Superficial cellulitis right lower leg   January 14, 2011  . AV block      2:1  January 14, 2011 / pacemaker plan when cellulitis resolved in length  . Bradycardia     2:1  AV block  . Hypokalemia     October, 2012  . Ejection fraction     EF 65%, echo, January, 2012  . Murmur     No significant valvular disease by echo, January, 2012  . Pacemaker     January, 2013 Medtronic Sensa  . Chronic diastolic CHF (congestive heart failure) (HCC)   . Hypertension   . Arthritis     "qwhere"  . Diabetes mellitus without complication [name])     Past Surgical History  Procedure Laterality Date  . Hip arthroplasty Right   . Partial hysterectomy      vaginal  . Cholecystectomy    . Insert / replace /  remove pacemaker  11/2011  . Permanent pacemaker insertion N/A 12/09/2011    Procedure: PERMANENT PACEMAKER INSERTION;  Surgeon: Marinus Maw, MD;  Location: [facility] CATH LAB;  Service: Cardiovascular;  Laterality: N/A;     Current Outpatient Prescriptions  Medication Sig Dispense Refill  . acetaminophen-codeine (TYLENOL #4) 300-60 MG tablet Take 1 tablet by mouth every 8 (eight) hours as needed for pain.     [name] allopurinol (ZYLOPRIM) 100 MG tablet Take 200 mg by mouth daily.    . carvedilol (COREG) 6.25 MG tablet Take 6.25 mg by mouth 2 (two) times daily with a meal.    . cholecalciferol (VITAMIN D) 1000 UNITS tablet Take 1,000 Units by mouth daily.    . Cranberry-Vitamin C-Inulin (UTI-STAT) LIQD Take 30 mLs by mouth daily.    [name] dextromethorphan-guaiFENesin (ROBITUSSIN COLD COUGH+ CHEST) 10-100 MG/5ML liquid Take 10 mLs by mouth every 4 (four) hours as needed for cough.    . diazepam (VALIUM) 5 MG tablet Take 2.5 mg by mouth 2 (two) times daily as needed for anxiety or muscle spasms. For  muscle spasms    . furosemide (LASIX) 80 MG tablet Take 80 mg by mouth 2 (two) times daily.    [name] gabapentin (NEURONTIN) 300 MG capsule Take 1 capsule (300 mg total) by mouth 3 (three) times daily. 60 capsule 0  . magnesium oxide (MAG-OX) 400 (241.3 MG) MG tablet TAKE ONE TABLET BY MOUTH ONE TIME DAILY 30 tablet 4  . Menthol, Topical Analgesic, (BIOFREEZE COLORLESS EX) Apply 1 application topically 2 (two) times daily.    . Multiple Vitamin (MULTIVITAMIN WITH MINERALS) TABS tablet Take 1 tablet by mouth daily.    . Multiple Vitamins-Minerals (CERTA-VITE PO) Take 1 tablet by mouth daily.    . nitroGLYCERIN (NITROSTAT) 0.4 MG SL tablet Place 0.4 mg under the tongue every 5 (five) minutes as needed for chest pain (X 3 DOSES FOR CHEST PAIN).    [name] polyethylene glycol powder (GLYCOLAX/MIRALAX) powder Take 17 g by mouth daily.    . potassium chloride SA (K-DUR,KLOR-CON) 20 MEQ tablet Take 20 mEq by mouth daily.      No  current facility-administered medications for this visit.    Allergies:   Aspirin and Tramadol    Social History:  The patient  reports that she has never smoked. She has never used smokeless tobacco. She reports that she does not drink alcohol or use illicit drugs.   Family History:  The patient's family history includes Cancer in her son; Cardiomyopathy in her father; Coronary artery disease in her son; Dementia in her mother; Diabetes in her father; Heart failure in her father. There is no history of Heart attack.    ROS:  Please see the history of present illness.   Otherwise, review of systems are positive for leg swelling.   All other systems are reviewed and negative.    PHYSICAL EXAM: VS:  BP 140/70 mmHg  Pulse 88  Ht  (1.549 m)  Wt 186 lb (84.369 kg)  BMI 35.16 kg/m2 , BMI Body mass index is 35.16 kg/(m^2). GEN: Well nourished, well developed, in no acute distress, wheelchair-bound HEENT: normal Neck: no JVD, carotid bruits, or masses Cardiac: RRR; no murmurs, rubs, or gallops, bilateral ankle edema  Respiratory:  clear to auscultation bilaterally, normal work of breathing GI: soft, nontender, nondistended, + BS MS: no deformity or atrophy Skin: warm and dry, no rash Neuro:  Strength and sensation are intact Psych: euthymic mood, full affect   EKG:   The ekg ordered in 2/17 demonstrates paced rhythm   Recent Labs: 08/12/2015: B Natriuretic Peptide 89.0 12/31/2015: ALT 8* 01/03/2016: Magnesium 2.1 01/06/2016: BUN 11; Creatinine, Ser 0.44; Hemoglobin 9.4*; Platelets 312; Potassium 4.1; Sodium 140   Lipid Panel No results found for: CHOL, TRIG, HDL, CHOLHDL, VLDL, LDLCALC, LDLDIRECT   Other studies Reviewed: Additional studies/ records that were reviewed today with results demonstrating: normal LV function on most recent echocardiogram.   ASSESSMENT AND PLAN:  1. Chronic diastolic heart failure: Continue Lasix. May need to have metolazone 2.5 mg once a week  when necessary if she has excessive swelling. 2. Edema: BNP was normal in September 2016. Some of her edema is likely related to venous insufficiency. Elevate legs as much as possible. 3. Pacemaker: Followed by Dr. Ladona Ridgel   Current medicines are reviewed at length with the patient today.  The patient concerns regarding her medicines were addressed.  The following changes have been made:  No change  Labs/ tests ordered today include:  No orders of the defined types were placed in this  encounter.    Recommend 150 minutes/week of aerobic exercise Low fat, low carb, high fiber diet recommended  Disposition:   FU in 1 year   Signed, Lance Muss, MD  06/07/2016 12:49 PM    [facility] [address] [place], [place], [place]  [address] Phone: [phone]; Fax: [phone]

## 2016-07-07 DIAGNOSIS — G8929 Other chronic pain: Secondary | ICD-10-CM | POA: Diagnosis not present

## 2016-07-07 DIAGNOSIS — M545 Low back pain: Secondary | ICD-10-CM | POA: Diagnosis not present

## 2016-07-07 DIAGNOSIS — G894 Chronic pain syndrome: Secondary | ICD-10-CM | POA: Diagnosis not present

## 2016-07-07 DIAGNOSIS — M1000 Idiopathic gout, unspecified site: Secondary | ICD-10-CM | POA: Diagnosis not present

## 2016-07-08 DIAGNOSIS — I509 Heart failure, unspecified: Secondary | ICD-10-CM | POA: Diagnosis not present

## 2016-07-08 DIAGNOSIS — E876 Hypokalemia: Secondary | ICD-10-CM | POA: Diagnosis not present

## 2016-07-08 DIAGNOSIS — R7309 Other abnormal glucose: Secondary | ICD-10-CM | POA: Diagnosis not present

## 2016-07-08 DIAGNOSIS — I270 Primary pulmonary hypertension: Secondary | ICD-10-CM | POA: Diagnosis not present

## 2016-07-20 DIAGNOSIS — R293 Abnormal posture: Secondary | ICD-10-CM | POA: Diagnosis not present

## 2016-08-02 DIAGNOSIS — I509 Heart failure, unspecified: Secondary | ICD-10-CM | POA: Diagnosis not present

## 2016-08-02 DIAGNOSIS — I270 Primary pulmonary hypertension: Secondary | ICD-10-CM | POA: Diagnosis not present

## 2016-08-02 DIAGNOSIS — E876 Hypokalemia: Secondary | ICD-10-CM | POA: Diagnosis not present

## 2016-08-02 DIAGNOSIS — R7309 Other abnormal glucose: Secondary | ICD-10-CM | POA: Diagnosis not present

## 2016-08-04 DIAGNOSIS — M1000 Idiopathic gout, unspecified site: Secondary | ICD-10-CM | POA: Diagnosis not present

## 2016-08-04 DIAGNOSIS — G894 Chronic pain syndrome: Secondary | ICD-10-CM | POA: Diagnosis not present

## 2016-08-04 DIAGNOSIS — M545 Low back pain: Secondary | ICD-10-CM | POA: Diagnosis not present

## 2016-08-04 DIAGNOSIS — G8929 Other chronic pain: Secondary | ICD-10-CM | POA: Diagnosis not present

## 2016-08-30 DIAGNOSIS — E119 Type 2 diabetes mellitus without complications: Secondary | ICD-10-CM | POA: Diagnosis not present

## 2016-08-30 DIAGNOSIS — Z79899 Other long term (current) drug therapy: Secondary | ICD-10-CM | POA: Diagnosis not present

## 2016-09-01 DIAGNOSIS — E876 Hypokalemia: Secondary | ICD-10-CM | POA: Diagnosis not present

## 2016-09-01 DIAGNOSIS — I5032 Chronic diastolic (congestive) heart failure: Secondary | ICD-10-CM | POA: Diagnosis not present

## 2016-09-01 DIAGNOSIS — I270 Primary pulmonary hypertension: Secondary | ICD-10-CM | POA: Diagnosis not present

## 2016-09-01 DIAGNOSIS — E119 Type 2 diabetes mellitus without complications: Secondary | ICD-10-CM | POA: Diagnosis not present

## 2016-09-01 IMAGING — CT CT HEAD W/O CM
5 of 11 series · 14 of 47 positions shown, 15 images · non-contrast
Comparison: CT head and CT cervical spine April 21, 2014

CLINICAL DATA: Pain following fall

EXAM:
CT HEAD WITHOUT CONTRAST
CT CERVICAL SPINE WITHOUT CONTRAST
TECHNIQUE: Multidetector CT imaging of the head and cervical spine was
performed following the standard protocol without intravenous
contrast. Multiplanar CT image reconstructions of the cervical spine
were also generated.

[Series 4: headseq 2.4 h60s · axial · 0.47mm/px · z∈[+249,+297]mm · 2 of 60 slices shown, 3 images]
[im 20/60  brain]
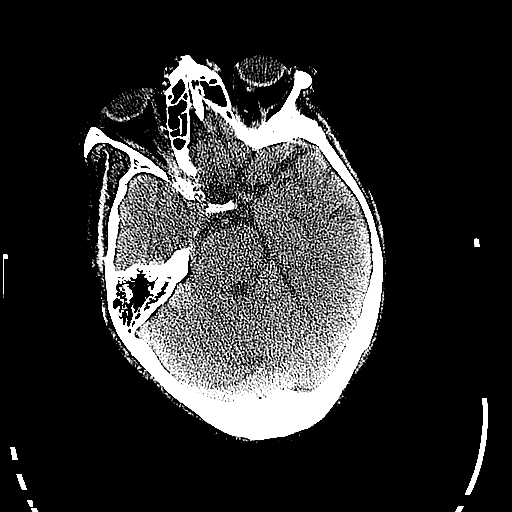
[im 20/60  bone]
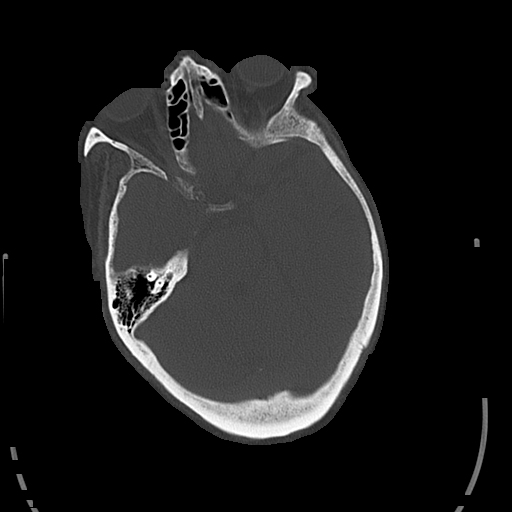
[im 40/60  brain]
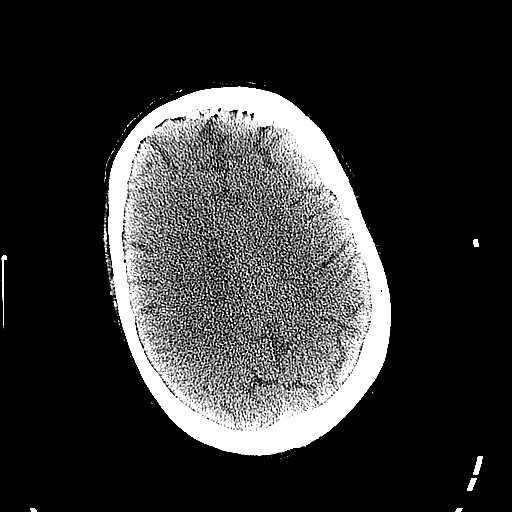

[Series 11: coronal bone 2.0 · coronal · 0.20mm/px · 3 of 51 slices shown]
[im 13/51  brain]
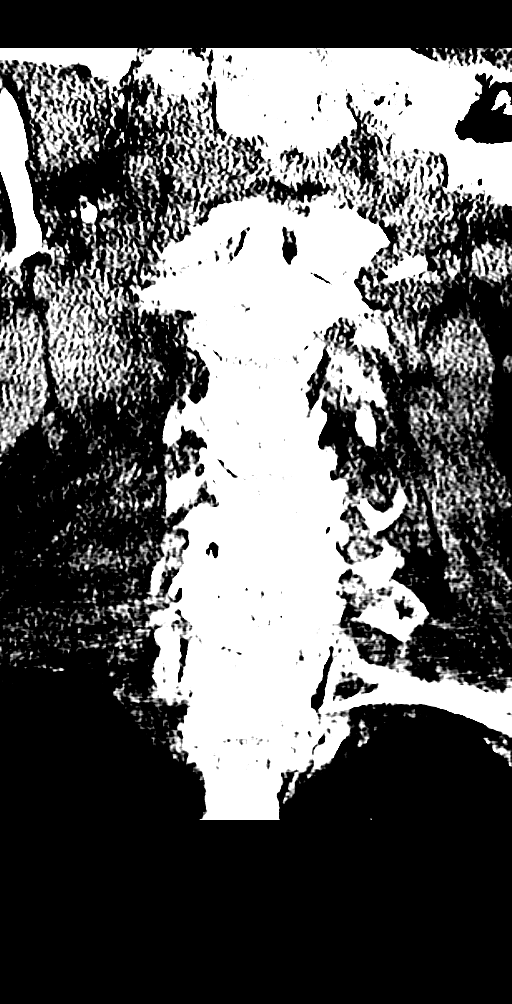
[im 26/51  brain]
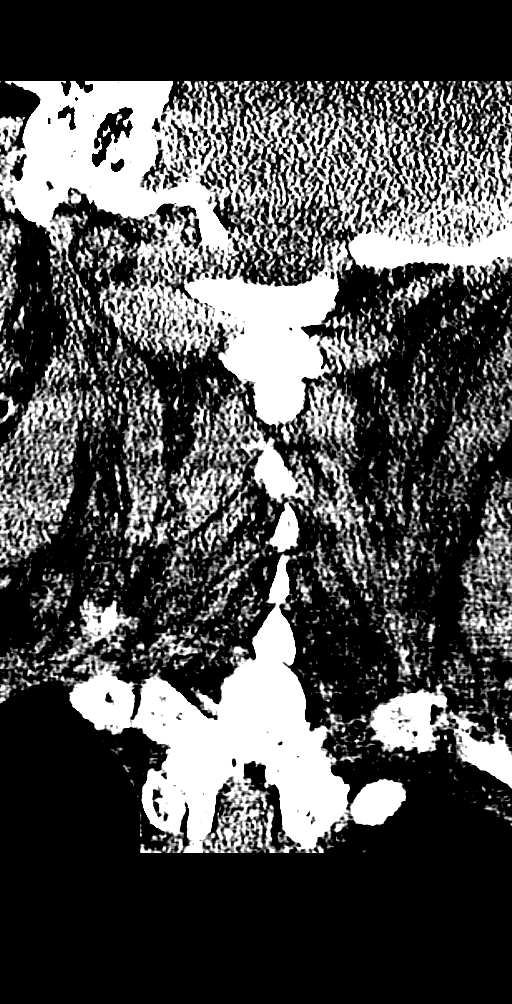
[im 38/51  brain]
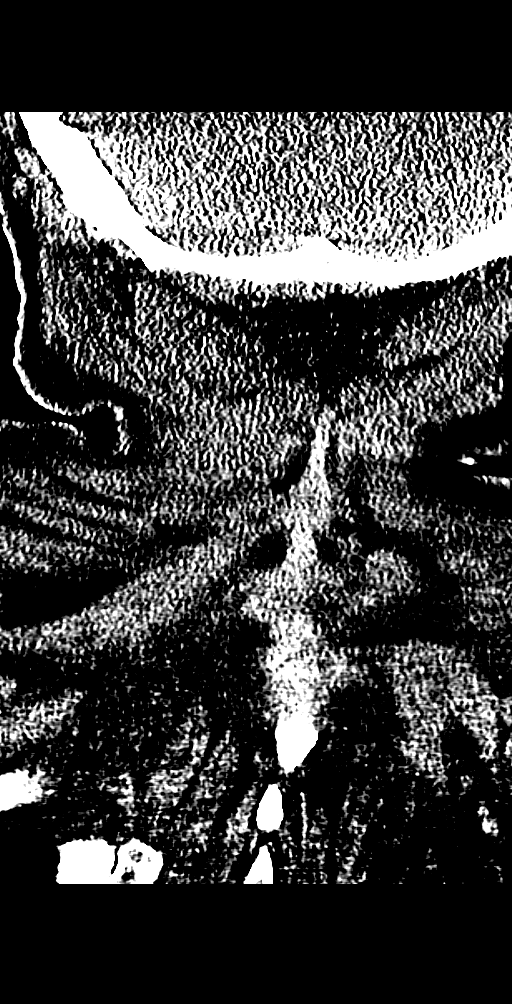

[Series 12: axial bone 2.0 · axial · 0.21mm/px · z∈[+76,+177]mm · 4 of 88 slices shown (1 of 2)]
[im 18/88  bone]
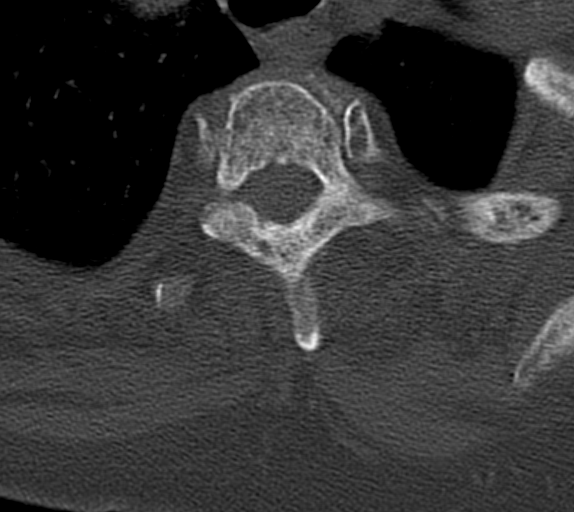
[im 35/88  bone]
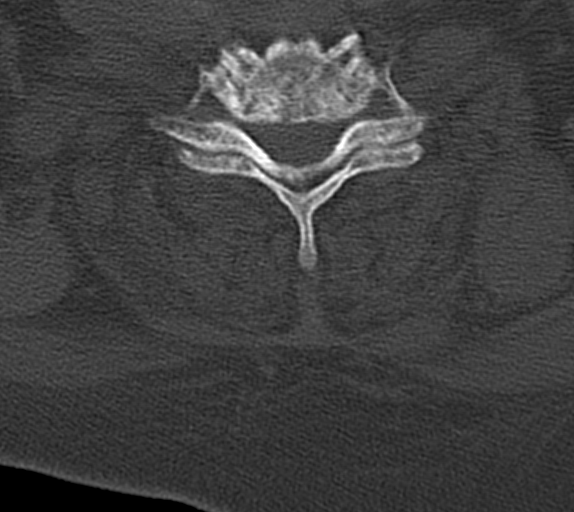
[im 53/88  bone]
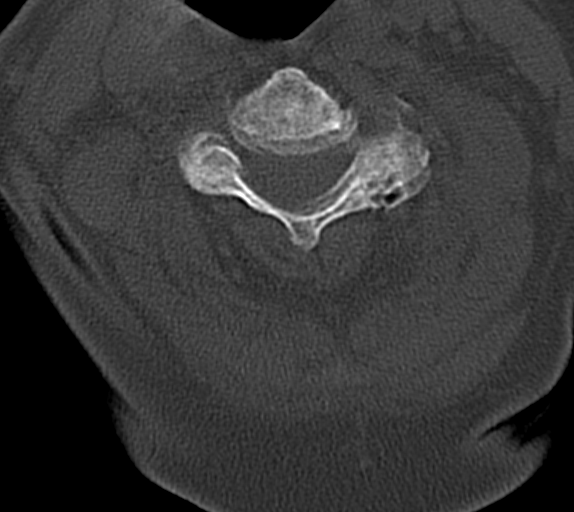
[im 70/88  bone]
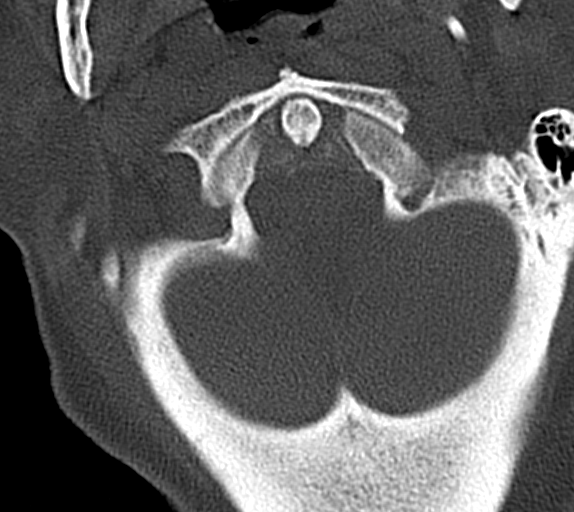

[Series 16: sagittal bone 2.0 · sagittal · 0.27mm/px · 2 of 54 slices shown]
[im 18/54  brain]
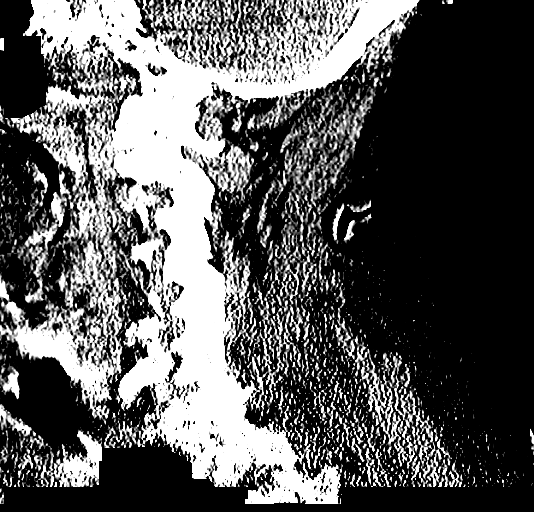
[im 36/54  brain]
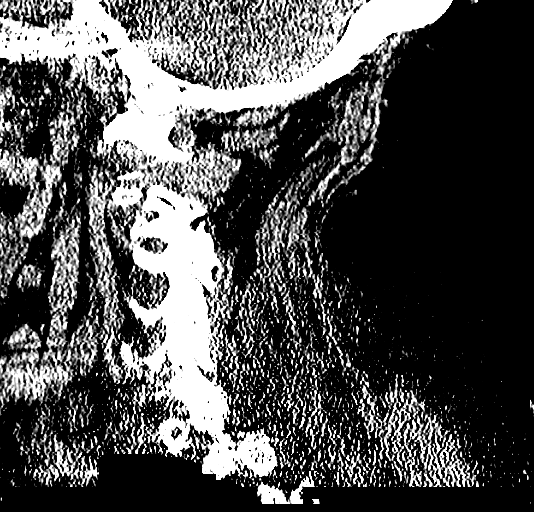

[Series 18: axial bone 2.0 · axial · 0.21mm/px · z∈[+90,+151]mm · 3 of 79 slices shown (2 of 2)]
[im 16/79  bone]
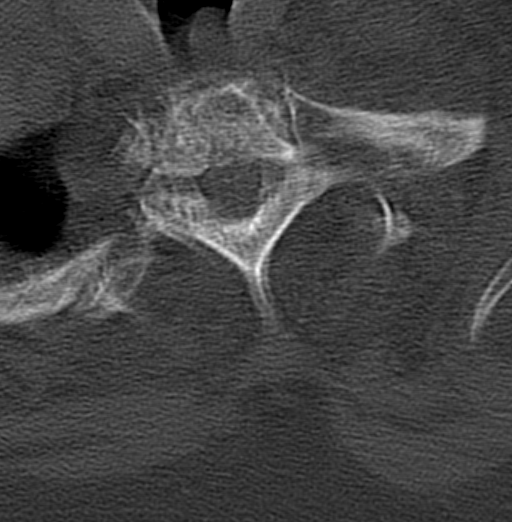
[im 32/79  bone]
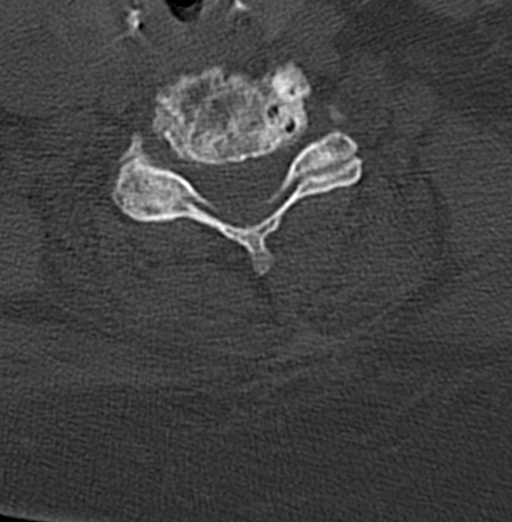
[im 47/79  bone]
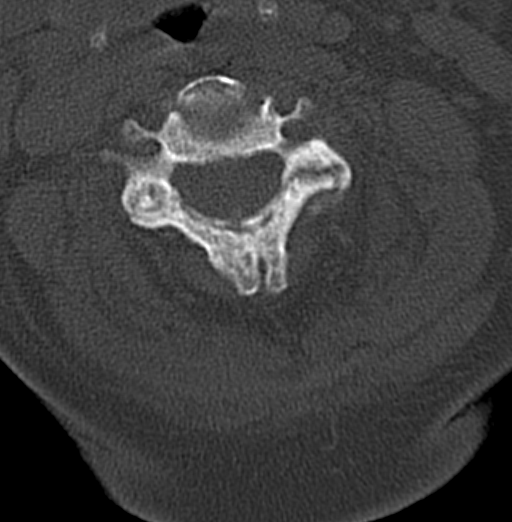

[14 of 47 positions shown; findings below may reference images not displayed]

FINDINGS: CT HEAD FINDINGS

Age related volume loss is stable. There is no intracranial mass,
hemorrhage, extra-axial fluid collection, or midline shift. There is
slight small vessel disease in the centra semiovale bilaterally.
Elsewhere gray-white compartments appear normal. No acute infarct
evident. The bony calvarium appears intact. The mastoid air cells
are clear. No intraorbital lesions are identified.

CT CERVICAL SPINE FINDINGS

There is no demonstrable fracture. There is stable slight
retrolisthesis of C5 on C6 and slight stable retrolisthesis at C6
and C7 as well as slight anterolisthesis of C7 on T1, felt to be due
to underlying spondylosis and stable. There is no new
spondylolisthesis. The prevertebral soft tissues and predental space
regions are normal. There is marked disc space narrowing at all
levels. There is ankylosis at C3-4, stable. There is facet
hypertrophy to varying degrees at all levels bilaterally. There is
exit foraminal narrowing at multiple levels due to bony hypertrophy.
These changes are greatest at C4-5 on the right and C5-6 and C6-7
bilaterally. There is no high-grade stenosis. A previously noted 9
mm nodular lesion in the right lobe of the thyroid is stable.
Thyroid appears mildly inhomogeneous but stable.
IMPRESSION: CT head: Age related volume loss with slight periventricular small
vessel disease. No intracranial mass, hemorrhage, or extra-axial
fluid collection. No evidence of acute infarct.

CT cervical spine: No demonstrable fracture. Areas of slight
spondylolisthesis remain stable and are felt to be due to underlying
spondylosis. There is extensive multilevel osteoarthritic
change/spondylosis. No high-grade stenosis. Stable small nodular
lesion right lobe of thyroid. Thyroid appears mildly inhomogeneous
but stable overall.

## 2016-09-08 DIAGNOSIS — M545 Low back pain: Secondary | ICD-10-CM | POA: Diagnosis not present

## 2016-09-08 DIAGNOSIS — G8929 Other chronic pain: Secondary | ICD-10-CM | POA: Diagnosis not present

## 2016-09-08 DIAGNOSIS — G894 Chronic pain syndrome: Secondary | ICD-10-CM | POA: Diagnosis not present

## 2016-09-08 DIAGNOSIS — M1000 Idiopathic gout, unspecified site: Secondary | ICD-10-CM | POA: Diagnosis not present

## 2016-09-12 DIAGNOSIS — E119 Type 2 diabetes mellitus without complications: Secondary | ICD-10-CM | POA: Diagnosis not present

## 2016-09-12 DIAGNOSIS — H524 Presbyopia: Secondary | ICD-10-CM | POA: Diagnosis not present

## 2016-09-12 DIAGNOSIS — H2513 Age-related nuclear cataract, bilateral: Secondary | ICD-10-CM | POA: Diagnosis not present

## 2016-09-19 DIAGNOSIS — M79674 Pain in right toe(s): Secondary | ICD-10-CM | POA: Diagnosis not present

## 2016-09-19 DIAGNOSIS — B351 Tinea unguium: Secondary | ICD-10-CM | POA: Diagnosis not present

## 2016-09-19 DIAGNOSIS — M79675 Pain in left toe(s): Secondary | ICD-10-CM | POA: Diagnosis not present

## 2016-09-19 DIAGNOSIS — E119 Type 2 diabetes mellitus without complications: Secondary | ICD-10-CM | POA: Diagnosis not present

## 2016-09-28 DIAGNOSIS — R7309 Other abnormal glucose: Secondary | ICD-10-CM | POA: Diagnosis not present

## 2016-09-28 DIAGNOSIS — I270 Primary pulmonary hypertension: Secondary | ICD-10-CM | POA: Diagnosis not present

## 2016-09-28 DIAGNOSIS — I509 Heart failure, unspecified: Secondary | ICD-10-CM | POA: Diagnosis not present

## 2016-09-28 DIAGNOSIS — E876 Hypokalemia: Secondary | ICD-10-CM | POA: Diagnosis not present

## 2016-10-08 DIAGNOSIS — G8929 Other chronic pain: Secondary | ICD-10-CM | POA: Diagnosis not present

## 2016-10-08 DIAGNOSIS — M1000 Idiopathic gout, unspecified site: Secondary | ICD-10-CM | POA: Diagnosis not present

## 2016-10-08 DIAGNOSIS — M545 Low back pain: Secondary | ICD-10-CM | POA: Diagnosis not present

## 2016-10-08 DIAGNOSIS — G894 Chronic pain syndrome: Secondary | ICD-10-CM | POA: Diagnosis not present

## 2016-11-02 DIAGNOSIS — I270 Primary pulmonary hypertension: Secondary | ICD-10-CM | POA: Diagnosis not present

## 2016-11-02 DIAGNOSIS — I509 Heart failure, unspecified: Secondary | ICD-10-CM | POA: Diagnosis not present

## 2016-11-02 DIAGNOSIS — E876 Hypokalemia: Secondary | ICD-10-CM | POA: Diagnosis not present

## 2016-11-02 DIAGNOSIS — R7309 Other abnormal glucose: Secondary | ICD-10-CM | POA: Diagnosis not present

## 2016-11-05 DIAGNOSIS — M1000 Idiopathic gout, unspecified site: Secondary | ICD-10-CM | POA: Diagnosis not present

## 2016-11-05 DIAGNOSIS — M545 Low back pain: Secondary | ICD-10-CM | POA: Diagnosis not present

## 2016-11-05 DIAGNOSIS — G8929 Other chronic pain: Secondary | ICD-10-CM | POA: Diagnosis not present

## 2016-11-05 DIAGNOSIS — G894 Chronic pain syndrome: Secondary | ICD-10-CM | POA: Diagnosis not present

## 2016-11-21 DIAGNOSIS — I270 Primary pulmonary hypertension: Secondary | ICD-10-CM | POA: Diagnosis not present

## 2016-11-21 DIAGNOSIS — R7309 Other abnormal glucose: Secondary | ICD-10-CM | POA: Diagnosis not present

## 2016-11-21 DIAGNOSIS — E876 Hypokalemia: Secondary | ICD-10-CM | POA: Diagnosis not present

## 2016-11-21 DIAGNOSIS — I509 Heart failure, unspecified: Secondary | ICD-10-CM | POA: Diagnosis not present

## 2016-11-22 ENCOUNTER — Telehealth: Payer: Self-pay | Admitting: Interventional Cardiology

## 2016-11-22 NOTE — Telephone Encounter (Signed)
See previous phone note.  

## 2016-11-22 NOTE — Telephone Encounter (Signed)
New Message:    Her daughter wants to know what fluid pill the pt was on,she was taking 2.5 mg of it.

## 2016-11-22 NOTE — Telephone Encounter (Signed)
Patient was on Metolazone 2.5 mg, that was discontinued in February when patient was admitted to the Hospital for acute encephalopathy due to Hypoglycemia and UTI. Patient was dehydrated at the time as well. Patient's daughter states that patient is in a nursing facility and she does not feel patient is being taken care of adequately. Daughter states that patient recently has had some BLE swelling, which is not normal for patient. Pateint's daughter wants patient to come in for an evaluation to see if her medications need adjusting. Made an appointment for patient to see PA on Friday. Informed patient's daughter, that the nursing facility should have a doctor there to take care of acute problems. Patient's daughter verbalized understanding.

## 2016-11-22 NOTE — Telephone Encounter (Signed)
New Message    Pt c/o swelling: STAT is pt has developed SOB within 24 hours      Daughter [name] is calling  1. How long have you been experiencing swelling?  Within week  2. Where is the swelling located? Hands and feet  3.  Are you currently taking a "fluid pill"? Yes 2.5  4.  Are you currently SOB? no  5.  Have you traveled recently no

## 2016-11-24 ENCOUNTER — Ambulatory Visit (INDEPENDENT_AMBULATORY_CARE_PROVIDER_SITE_OTHER): Payer: Medicare Other | Admitting: Cardiology

## 2016-11-24 ENCOUNTER — Telehealth: Payer: Self-pay | Admitting: Interventional Cardiology

## 2016-11-24 ENCOUNTER — Encounter: Payer: Self-pay | Admitting: Cardiology

## 2016-11-24 ENCOUNTER — Encounter (INDEPENDENT_AMBULATORY_CARE_PROVIDER_SITE_OTHER): Payer: Self-pay

## 2016-11-24 VITALS — BP 130/78 | HR 76 | Ht 61.0 in | Wt 215.0 lb

## 2016-11-24 DIAGNOSIS — I5033 Acute on chronic diastolic (congestive) heart failure: Secondary | ICD-10-CM | POA: Insufficient documentation

## 2016-11-24 DIAGNOSIS — R600 Localized edema: Secondary | ICD-10-CM | POA: Diagnosis not present

## 2016-11-24 NOTE — Telephone Encounter (Signed)
Pts daughter, [name], states that she just wanted to make me aware that the pt has an appt with Boyce Medici, PA this afternoon and is asking that I try to see her while she is here as the pt looks forward to seeing me. I told [name] that I will see the pt before she leaves as I look forward to seeing her as well.

## 2016-11-24 NOTE — Telephone Encounter (Signed)
[name] ( Daughter ) is wanting to speak w/ you in reference to her mom . If possible please call before 3:30pm . Thanks

## 2016-11-24 NOTE — Progress Notes (Signed)
11/24/2016 ISAAC GATTA   [birth year]  [id number]  Primary Physician No PCP Per Patient Primary Cardiologist: Dr. Eldridge Dace  Electrophysiologist: Dr. Ladona Ridgel   Reason for Visit/CC: Bilateral LEE  HPI:  The patient is a 83 y/o nursing home resident, wheelchair dependent, female, followed by Dr. Eldridge Dace, who presents to clinic for f/u. She presents with complaints of increasing LEE. She has a h/o chronic diastolic HF and has been treated with lasix. Dr. Eldridge Dace has also recommended, in the past, that she take 2.5 mg of metolazone, once a week PRN for excess swelling.  She cannot stand, so weights are checked with a lift. She also has had issues with chronic lower extremity venous stasis and cellulitis. She also has a h/o HTN and a PPM, followed by Dr. Ladona Ridgel that was implanted for CHB.   She notes worsening LEE and upper extremity edema. She notes her hands are "puffy". She also notes progressive weight gain over the last several months. Weight has increased from prior baseline of 180 lb to 215 lb. They use a lift at nursing facility for daily weights. She notes that she is not on a restricted diet. Her food often taste salty, per patient, and she also has access to outside food from family members. She often eats fast food ([employer] and hotdogs, per son's report). She denies dyspnea. No orthopnea or chest pain.     Current Meds  Medication Sig  . acetaminophen-codeine (TYLENOL #4) 300-60 MG tablet Take 1 tablet by mouth every 8 (eight) hours as needed for pain.   [name] allopurinol (ZYLOPRIM) 100 MG tablet Take 200 mg by mouth daily.  . carvedilol (COREG) 6.25 MG tablet Take 6.25 mg by mouth 2 (two) times daily with a meal.  . cholecalciferol (VITAMIN D) 1000 UNITS tablet Take 1,000 Units by mouth daily.  . diazepam (VALIUM) 5 MG tablet Take 2.5 mg by mouth 2 (two) times daily as needed for anxiety or muscle spasms. For muscle spasms  . furosemide (LASIX) 80 MG tablet Take 80 mg by mouth 2 (two)  times daily.  [name] gabapentin (NEURONTIN) 300 MG capsule Take 1 capsule (300 mg total) by mouth 3 (three) times daily.  . magnesium oxide (MAG-OX) 400 (241.3 MG) MG tablet TAKE ONE TABLET BY MOUTH ONE TIME DAILY  . Menthol, Topical Analgesic, (BIOFREEZE COLORLESS EX) Apply 1 application topically 2 (two) times daily.  . metFORMIN (GLUCOPHAGE) 500 MG tablet Take by mouth daily with breakfast.  . metolazone (ZAROXOLYN) 2.5 MG tablet Take 2.5 mg by mouth once a week.  . Multiple Vitamins-Minerals (CERTA-VITE PO) Take 1 tablet by mouth daily.  . nitroGLYCERIN (NITROSTAT) 0.4 MG SL tablet Place 0.4 mg under the tongue every 5 (five) minutes as needed for chest pain (X 3 DOSES FOR CHEST PAIN).  [name] polyethylene glycol powder (GLYCOLAX/MIRALAX) powder Take 17 g by mouth daily.  . potassium chloride SA (K-DUR,KLOR-CON) 20 MEQ tablet Take 40 mEq by mouth daily.    Allergies  Allergen Reactions  . Aspirin Rash and Other (See Comments)    GI distress  . Tramadol Other (See Comments)    GI distress  . Naproxen    Past Medical History:  Diagnosis Date  . Arthritis    "qwhere"  . AV block     2:1  January 14, 2011 / pacemaker plan when cellulitis resolved in length  . Bradycardia    2:1  AV block  . Cellulitis    Superficial cellulitis right lower leg  January 14, 2011  . Chronic diastolic CHF (congestive heart failure) (HCC)   . Diabetes mellitus without complication (HCC)   . Edema    . EF 65-70%, echo, November 29, 2010  . Ejection fraction    EF 65%, echo, January, 2012  . Hypertension   . Hypokalemia    October, 2012  . Murmur    No significant valvular disease by echo, January, 2012  . Pacemaker    January, 2013 Medtronic Sensa  . Pulmonary hypertension     46 mmHg... echo... January, 2012   Family History  Problem Relation Age of Onset  . Cardiomyopathy Father   . Diabetes Father   . Heart failure Father   . Cancer Son   . Coronary artery disease Son   . Dementia Mother   .  Heart attack Neg Hx    Past Surgical History:  Procedure Laterality Date  . CHOLECYSTECTOMY    . HIP ARTHROPLASTY Right   . INSERT / REPLACE / REMOVE PACEMAKER  11/2011  . PARTIAL HYSTERECTOMY     vaginal  . PERMANENT PACEMAKER INSERTION N/A 12/09/2011   Procedure: PERMANENT PACEMAKER INSERTION;  Surgeon: Marinus Maw, MD;  Location[facility] CATH LAB;  Service: Cardiovascular;  Laterality: N/A;   Social History   Social History  . Marital status: Widowed    Spouse name: N/A  . Number of children: N/A  . Years of education: N/A   Occupational History  . Retired Retired   Social History Main Topics  . Smoking status: Never Smoker  . Smokeless tobacco: Never Used  . Alcohol use No  . Drug use: No  . Sexual activity: No   Other Topics Concern  . Not on file   Social History Narrative   Retired    Widowed    Tobacco Use - No.    Alcohol Use - no   Regular Exercise - no   Drug Use - no     Review of Systems: General: negative for chills, fever, night sweats or weight changes.  Cardiovascular: negative for chest pain, dyspnea on exertion, edema, orthopnea, palpitations, paroxysmal nocturnal dyspnea or shortness of breath Dermatological: negative for rash Respiratory: negative for cough or wheezing Urologic: negative for hematuria Abdominal: negative for nausea, vomiting, diarrhea, bright red blood per rectum, melena, or hematemesis Neurologic: negative for visual changes, syncope, or dizziness All other systems reviewed and are otherwise negative except as noted above.   Physical Exam:  Blood pressure 130/78, pulse 76, height  (1.549 m), weight 215 lb (97.5 kg).  General appearance: alert, cooperative and no distress Neck: no carotid bruit and no JVD Lungs: clear to auscultation bilaterally Heart: regular rate and rhythm, S1, S2 normal, no murmur, click, rub or gallop Extremities: 1+ bilateral LEE Pulses: 2+ and symmetric Skin: Skin color, texture, turgor normal.  No rashes or lesions Neurologic: Grossly normal  EKG Electronic ventricular pacemaker. 89 bpm   ASSESSMENT AND PLAN:   1. Acute on Chronic Diastolic HF: check BNP and BMP today. Continue Lasix 80 mg BID. Order given for nursing facility to give 2.5 mg of metolazone, 30 min before am dose of Lasix x 3 days. Increase K to 40 mEq x 3 days. Repeat BMP in 1 week. Continue to track weight daily and document. Adjust diet order. Low sodium. Patient was educated on the association between salt intake and fluid retention. She was encouraged to avoid outside food from family members. Her son agrees to this as well.  2. PPM: implanted for CHB. Followed by Dr. Ladona Ridgel.   3. HTN: BP is controlled.   PLAN  F/u in 3-4 weeks for repeat assessment.   Robbie Lis PA-C 11/24/2016 4:31 PM

## 2016-11-24 NOTE — Patient Instructions (Addendum)
Medication Instructions:  1. TAKE METOLAZONE 2.5 MG DAILY FOR 3 DAYS 30 MINUTES BEFORE YOUR MORNING DOSE OF LASIX;   2. INCREASE POTASSIUM TO 40 MEQ DAILY FOR 3 DAYS WHILE TAKING THE METOLAZONE FOR 3 DAYS   Labwork: 1.TODAY BMET, PRO BNP  2. BMET IN 1 WEEK TO BE DONE BY THE NURSING FACILITY WITH THE RESULTS TO BE FAXED TO Boyce Medici, PAC [phone]  Testing/Procedures: NONE  Follow-Up: 12/15/16 @ 3 PM FOLLOW WITH BRITTANY SIMMONS, PAC  Any Other Special Instructions Will Be Listed Below (If Applicable).     If you need a refill on your cardiac medications before your next appointment, please call your pharmacy.

## 2016-11-25 ENCOUNTER — Ambulatory Visit: Payer: Medicare Other | Admitting: Cardiology

## 2016-11-25 LAB — SPECIMEN STATUS

## 2016-11-25 LAB — PRO B NATRIURETIC PEPTIDE: NT-PRO BNP: 616 pg/mL (ref 0–738)

## 2016-11-25 LAB — BASIC METABOLIC PANEL
BUN / CREAT RATIO: 26 (ref 12–28)
BUN: 18 mg/dL (ref 8–27)
CO2: 23 mmol/L (ref 18–29)
CREATININE: 0.69 mg/dL (ref 0.57–1.00)
Calcium: 9.8 mg/dL (ref 8.7–10.3)
Chloride: 100 mmol/L (ref 96–106)
GFR calc Af Amer: 93 mL/min/{1.73_m2} (ref >59)
GFR calc non Af Amer: 81 mL/min/{1.73_m2} (ref >59)
GLUCOSE: 118 mg/dL — AB (ref 65–99)
Potassium: 4.4 mmol/L (ref 3.5–5.2)
Sodium: 144 mmol/L (ref 134–144)

## 2016-11-28 ENCOUNTER — Telehealth: Payer: Self-pay | Admitting: Cardiology

## 2016-11-28 NOTE — Telephone Encounter (Signed)
**Note De-Identified [facility] Obfuscation** [name] is advised that the pt is suppose to take her fluid medications as advised by Boyce Medici, PA-c on 1/4. I read below notes to [facility] from the pts OV on 1/4. She verbalized understanding.  1. Acute on Chronic Diastolic HF: check BNP and BMP today. Continue Lasix 80 mg BID. Order given for nursing facility to give 2.5 mg of metolazone, 30 min before am dose of Lasix x 3 days. Increase K to 40 mEq x 3 days. Repeat BMP in 1 week. Continue to track weight daily and document. Adjust diet order. Low sodium. Patient was educated on the association between salt intake and fluid retention. She was encouraged to avoid outside food from family members. Her son agrees to this as well.

## 2016-11-28 NOTE — Telephone Encounter (Signed)
[name] is calling because she states that her mother is at the nursing facility [facility]) and they are currently trying to give her a fluid pill (she did not know the name) however [name] was advised to wait for the lab work results before [name] started medication. Also there is an increase in fluid building. Please call [name], thanks.

## 2016-12-02 ENCOUNTER — Encounter: Payer: Self-pay | Admitting: Cardiology

## 2016-12-02 DIAGNOSIS — Z79899 Other long term (current) drug therapy: Secondary | ICD-10-CM | POA: Diagnosis not present

## 2016-12-05 DIAGNOSIS — Z79899 Other long term (current) drug therapy: Secondary | ICD-10-CM | POA: Diagnosis not present

## 2016-12-06 DIAGNOSIS — L0390 Cellulitis, unspecified: Secondary | ICD-10-CM | POA: Diagnosis not present

## 2016-12-06 DIAGNOSIS — N183 Chronic kidney disease, stage 3 (moderate): Secondary | ICD-10-CM | POA: Diagnosis not present

## 2016-12-06 DIAGNOSIS — G894 Chronic pain syndrome: Secondary | ICD-10-CM | POA: Diagnosis not present

## 2016-12-06 DIAGNOSIS — I5030 Unspecified diastolic (congestive) heart failure: Secondary | ICD-10-CM | POA: Diagnosis not present

## 2016-12-06 DIAGNOSIS — E1140 Type 2 diabetes mellitus with diabetic neuropathy, unspecified: Secondary | ICD-10-CM | POA: Diagnosis not present

## 2016-12-09 DIAGNOSIS — R5381 Other malaise: Secondary | ICD-10-CM | POA: Diagnosis not present

## 2016-12-12 DIAGNOSIS — Z79899 Other long term (current) drug therapy: Secondary | ICD-10-CM | POA: Diagnosis not present

## 2016-12-14 DIAGNOSIS — I10 Essential (primary) hypertension: Secondary | ICD-10-CM | POA: Diagnosis not present

## 2016-12-14 DIAGNOSIS — Z794 Long term (current) use of insulin: Secondary | ICD-10-CM | POA: Diagnosis not present

## 2016-12-14 DIAGNOSIS — Z452 Encounter for adjustment and management of vascular access device: Secondary | ICD-10-CM | POA: Diagnosis not present

## 2016-12-14 DIAGNOSIS — E875 Hyperkalemia: Secondary | ICD-10-CM | POA: Diagnosis not present

## 2016-12-15 ENCOUNTER — Encounter: Payer: Self-pay | Admitting: Cardiology

## 2016-12-15 ENCOUNTER — Ambulatory Visit (INDEPENDENT_AMBULATORY_CARE_PROVIDER_SITE_OTHER): Payer: Medicare Other | Admitting: Cardiology

## 2016-12-15 VITALS — BP 140/80 | HR 102 | Ht 61.0 in | Wt 215.0 lb

## 2016-12-15 DIAGNOSIS — I5032 Chronic diastolic (congestive) heart failure: Secondary | ICD-10-CM

## 2016-12-15 DIAGNOSIS — M545 Low back pain: Secondary | ICD-10-CM | POA: Diagnosis not present

## 2016-12-15 DIAGNOSIS — G894 Chronic pain syndrome: Secondary | ICD-10-CM | POA: Diagnosis not present

## 2016-12-15 DIAGNOSIS — M1000 Idiopathic gout, unspecified site: Secondary | ICD-10-CM | POA: Diagnosis not present

## 2016-12-15 DIAGNOSIS — G8929 Other chronic pain: Secondary | ICD-10-CM | POA: Diagnosis not present

## 2016-12-15 NOTE — Progress Notes (Signed)
12/15/2016 [name]   1931-09-11  [id number]  Primary Physician No PCP Per Patient Primary Cardiologist: Dr. Eldridge Dace  Electrophysiologist: Dr. Ladona Ridgel    Reason for Visit/CC: F/u for Bilateral LEE   HPI:  The patient is a 83 y/o nursing home resident, wheelchair dependent, female, followed by Dr. Eldridge Dace, who presents to clinic for 2 week f/u. I evaluated her on 11/24/16 for bilateral LEE. She has a h/o chronic diastolic HF and has been treated with lasix. Dr. Eldridge Dace has also recommended, in the past, that she take 2.5 mg of metolazone, once a week PRN for excess swelling.  She cannot stand, so weights are checked with a lift. She also has had issues with chronic lower extremity venous stasis and cellulitis. She also has a h/o HTN and a PPM, followed by Dr. Ladona Ridgel that was implanted for CHB.   When I evaluated her on 11/24/2016, she had noted worsening LEE and upper extremity edema. Her hands were puffy. She had also noted progressive weight gain over the last several months. Weight had increased from prior baseline of 180 lb to 215 lb. They use a lift at nursing facility for daily weights. She noted that she had not been on a restricted diet. Her food often taste salty, per patient, and she also has access to outside food from family members. She often eats fast food ([employer] and hotdogs, per son's report). She denied dyspnea. No orthopnea or chest pain.   I encouraged her to refrain from outside food from family members to limit sodium content. I also wrote orders to ALF to place patient on a low sodium diet. I also adjusted her diuretic regimen. I ordered for her to get 2.5 mg of metolazone, 30 min prior to her Lasix x 3 days for added diuresis. I also wrote order to double her Kdur x 3 days. She had a f/u BMP at ALF on 12/02/16 to recheck kidneys and electrolytes. Results were faxed to our office. SCr was stable at 0.96. K was WNL at 3.5.   She is here in clinic today with her son and one  of her caregivers from the ALF. She continues to note bilateral LEE but no dyspnea, orthopnea or PND. Unfortunately, she has developed bilateral LE cellulitis, which she has a prior h/o. The physician at the ALF has started her on Keflex 500 mg BID. She denies fever and chills. We checked vital signs today and she is afebrile. She has 1+ bilateral LE pitting edema still on exam.    Current Meds  Medication Sig  . acetaminophen-codeine (TYLENOL #4) 300-60 MG tablet Take 1 tablet by mouth every 8 (eight) hours as needed for pain.   [name] allopurinol (ZYLOPRIM) 100 MG tablet Take 200 mg by mouth daily.  . carvedilol (COREG) 6.25 MG tablet Take 6.25 mg by mouth 2 (two) times daily with a meal.  . cholecalciferol (VITAMIN D) 1000 UNITS tablet Take 1,000 Units by mouth daily.  . diazepam (VALIUM) 5 MG tablet Take 2.5 mg by mouth 2 (two) times daily as needed for anxiety or muscle spasms. For muscle spasms  . furosemide (LASIX) 20 MG tablet Take 40 mg by mouth 2 (two) times daily.  [name] gabapentin (NEURONTIN) 300 MG capsule Take 1 capsule (300 mg total) by mouth 3 (three) times daily.  . magnesium oxide (MAG-OX) 400 (241.3 MG) MG tablet TAKE ONE TABLET BY MOUTH ONE TIME DAILY  . Menthol, Topical Analgesic, (BIOFREEZE COLORLESS EX) Apply 1 application topically  2 (two) times daily.  . metFORMIN (GLUCOPHAGE) 500 MG tablet Take by mouth daily with breakfast.  . metolazone (ZAROXOLYN) 2.5 MG tablet Take 2.5 mg by mouth once a week. TAKE EVERY OTHER DAY 30 MINUTES BEFORE LASIX  . Multiple Vitamins-Minerals (CERTA-VITE PO) Take 1 tablet by mouth daily.  . nitroGLYCERIN (NITROSTAT) 0.4 MG SL tablet Place 0.4 mg under the tongue every 5 (five) minutes as needed for chest pain (X 3 DOSES FOR CHEST PAIN).  [name] polyethylene glycol powder (GLYCOLAX/MIRALAX) powder Take 17 g by mouth daily.  . potassium chloride SA (K-DUR,KLOR-CON) 20 MEQ tablet Take 40 mEq by mouth daily.    Allergies  Allergen Reactions  . Aspirin Rash  and Other (See Comments)    GI distress  . Tramadol Other (See Comments)    GI distress  . Naproxen    Past Medical History:  Diagnosis Date  . Arthritis    "qwhere"  . AV block     2:1  January 14, 2011 / pacemaker plan when cellulitis resolved in length  . Bradycardia    2:1  AV block  . Cellulitis    Superficial cellulitis right lower leg   January 14, 2011  . Chronic diastolic CHF (congestive heart failure) (HCC)   . Diabetes mellitus without complication (HCC)   . Edema    . EF 65-70%, echo, November 29, 2010  . Ejection fraction    EF 65%, echo, January, 2012  . Hypertension   . Hypokalemia    October, 2012  . Murmur    No significant valvular disease by echo, January, 2012  . Pacemaker    January, 2013 Medtronic Sensa  . Pulmonary hypertension     46 mmHg... echo... January, 2012   Family History  Problem Relation Age of Onset  . Cardiomyopathy Father   . Diabetes Father   . Heart failure Father   . Cancer Son   . Coronary artery disease Son   . Dementia Mother   . Heart attack Neg Hx    Past Surgical History:  Procedure Laterality Date  . CHOLECYSTECTOMY    . HIP ARTHROPLASTY Right   . INSERT / REPLACE / REMOVE PACEMAKER  11/2011  . PARTIAL HYSTERECTOMY     vaginal  . PERMANENT PACEMAKER INSERTION N/A 12/09/2011   Procedure: PERMANENT PACEMAKER INSERTION;  Surgeon: Marinus Maw, MD;  Location: [facility] CATH LAB;  Service: Cardiovascular;  Laterality: N/A;   Social History   Social History  . Marital status: Widowed    Spouse name: N/A  . Number of children: N/A  . Years of education: N/A   Occupational History  . Retired Retired   Social History Main Topics  . Smoking status: Never Smoker  . Smokeless tobacco: Never Used  . Alcohol use No  . Drug use: No  . Sexual activity: No   Other Topics Concern  . Not on file   Social History Narrative   Retired    Widowed    Tobacco Use - No.    Alcohol Use - no   Regular Exercise - no   Drug Use  - no     Review of Systems: General: negative for chills, fever, night sweats or weight changes.  Cardiovascular: negative for chest pain, dyspnea on exertion, edema, orthopnea, palpitations, paroxysmal nocturnal dyspnea or shortness of breath Dermatological: negative for rash Respiratory: negative for cough or wheezing Urologic: negative for hematuria Abdominal: negative for nausea, vomiting, diarrhea, bright red blood per rectum,  melena, or hematemesis Neurologic: negative for visual changes, syncope, or dizziness All other systems reviewed and are otherwise negative except as noted above.   Physical Exam:  Blood pressure 140/80, pulse (!) 102, height 5\' 1"  (1.549 m), weight 215 lb (97.5 kg).  General appearance: alert, cooperative and no distress Neck: no carotid bruit and no JVD Lungs: clear to auscultation bilaterally Heart: regular rate and rhythm, S1, S2 normal, no murmur, click, rub or gallop Extremities: 1+ bilateral LEE, bilateral cellulitis,  Pulses: 2+ and symmetric Skin: Skin color, texture, turgor normal. No rashes or lesions Neurologic: Grossly normal  EKG not performed   ASSESSMENT AND PLAN:   1. Acute on Chronic Diastolic HF:  She continues to note bilateral LEE but no dyspnea, orthopnea or PND. Unfortunately, she has developed bilateral LE cellulitis, which she has a prior h/o. The physician at the ALF has started her on Keflex 500 mg BID. She denies fever and chills. We checked vital signs today and she is afebrile. She has 1+ bilateral LE pitting edema still on exam. She did ok with the Metolazone that we wrote for x 3 doses. Repeat labs showed stable renal function and K. We will change order of metolazone from PRN to scheduled, every other day, 2.5 mg 30 min prior to AM lasix dose, M,W,F. Repeat BMP in 7 days. Order written for patient  to elevate legs at home for edema. Low sodium diet. Her ALF will monitor renal function and electrolytes and will notify our office  of any worsening edema or development of dyspnea.   2. PPM: implanted for CHB. Followed by Dr. Ladona Ridgel.   3. HTN: BP is controlled.  4. Bilateral LEE Cellulitis: recently diagnosed however she has a prior h/o recurrence in the setting of chronic edema. She has been started on antibiotics by her PCP. She is on Keflex 500 mg BID. We checked VS in clinic today and she is afebrile. She also denies subjective fever and chills. Continue management per PCP.   PLAN  F/u with Dr. Eldridge Dace in 2-3 months   Robbie Lis [facility] 12/15/2016 4:28 PM

## 2016-12-15 NOTE — Patient Instructions (Addendum)
Medication Instructions:   START TAKING METOLAZONE 2.5 MG EVERY OTHER DAY  30 MINUTE S BEFORE TAKING LASIX  20 MG   If you need a refill on your cardiac medications before your next appointment, please call your pharmacy.  Labwork: NONE ORDERED  TODAY    Testing/Procedures: NONE ORDERED  TODAY    Follow-Up: IN 3 MONTHS WITH DR Eldridge Dace    Any Other Special Instructions Will Be Listed Below (If Applicable).

## 2016-12-16 DIAGNOSIS — Z79899 Other long term (current) drug therapy: Secondary | ICD-10-CM | POA: Diagnosis not present

## 2016-12-17 LAB — CBC WITH DIFFERENTIAL/PLATELET
BASOS ABS: 0 10*3/uL (ref 0.0–0.2)
Basos: 0 %
EOS (ABSOLUTE): 0.3 10*3/uL (ref 0.0–0.4)
EOS: 3 %
HEMATOCRIT: 37.2 % (ref 34.0–46.6)
Hemoglobin: 12.3 g/dL (ref 11.1–15.9)
IMMATURE GRANULOCYTES: 0 %
Immature Grans (Abs): 0 10*3/uL (ref 0.0–0.1)
LYMPHS: 17 %
Lymphocytes Absolute: 1.5 10*3/uL (ref 0.7–3.1)
MCH: 29.1 pg (ref 26.6–33.0)
MCHC: 33.1 g/dL (ref 31.5–35.7)
MCV: 88 fL (ref 79–97)
Monocytes Absolute: 0.6 10*3/uL (ref 0.1–0.9)
Monocytes: 7 %
Neutrophils Absolute: 6.3 10*3/uL (ref 1.4–7.0)
Neutrophils: 73 %
Platelets: 312 10*3/uL (ref 150–379)
RBC: 4.22 x10E6/uL (ref 3.77–5.28)
RDW: 15.6 % — ABNORMAL HIGH (ref 12.3–15.4)
WBC: 8.7 10*3/uL (ref 3.4–10.8)

## 2016-12-17 LAB — SPECIMEN STATUS REPORT

## 2016-12-20 DIAGNOSIS — G894 Chronic pain syndrome: Secondary | ICD-10-CM | POA: Diagnosis not present

## 2016-12-20 DIAGNOSIS — I5030 Unspecified diastolic (congestive) heart failure: Secondary | ICD-10-CM | POA: Diagnosis not present

## 2016-12-20 DIAGNOSIS — E1140 Type 2 diabetes mellitus with diabetic neuropathy, unspecified: Secondary | ICD-10-CM | POA: Diagnosis not present

## 2016-12-20 DIAGNOSIS — N183 Chronic kidney disease, stage 3 (moderate): Secondary | ICD-10-CM | POA: Diagnosis not present

## 2016-12-20 DIAGNOSIS — L0390 Cellulitis, unspecified: Secondary | ICD-10-CM | POA: Diagnosis not present

## 2016-12-21 DIAGNOSIS — I82409 Acute embolism and thrombosis of unspecified deep veins of unspecified lower extremity: Secondary | ICD-10-CM | POA: Diagnosis not present

## 2016-12-22 DIAGNOSIS — Z79899 Other long term (current) drug therapy: Secondary | ICD-10-CM | POA: Diagnosis not present

## 2016-12-27 DIAGNOSIS — Z79899 Other long term (current) drug therapy: Secondary | ICD-10-CM | POA: Diagnosis not present

## 2017-01-10 DIAGNOSIS — G9340 Encephalopathy, unspecified: Secondary | ICD-10-CM | POA: Diagnosis not present

## 2017-01-10 DIAGNOSIS — N183 Chronic kidney disease, stage 3 (moderate): Secondary | ICD-10-CM | POA: Diagnosis not present

## 2017-01-10 DIAGNOSIS — R41841 Cognitive communication deficit: Secondary | ICD-10-CM | POA: Diagnosis not present

## 2017-01-11 DIAGNOSIS — D649 Anemia, unspecified: Secondary | ICD-10-CM | POA: Diagnosis not present

## 2017-01-11 DIAGNOSIS — G9340 Encephalopathy, unspecified: Secondary | ICD-10-CM | POA: Diagnosis not present

## 2017-01-12 DIAGNOSIS — M1000 Idiopathic gout, unspecified site: Secondary | ICD-10-CM | POA: Diagnosis not present

## 2017-01-12 DIAGNOSIS — G8929 Other chronic pain: Secondary | ICD-10-CM | POA: Diagnosis not present

## 2017-01-12 DIAGNOSIS — G9340 Encephalopathy, unspecified: Secondary | ICD-10-CM | POA: Diagnosis not present

## 2017-01-12 DIAGNOSIS — M545 Low back pain: Secondary | ICD-10-CM | POA: Diagnosis not present

## 2017-01-12 DIAGNOSIS — G894 Chronic pain syndrome: Secondary | ICD-10-CM | POA: Diagnosis not present

## 2017-01-13 DIAGNOSIS — G9340 Encephalopathy, unspecified: Secondary | ICD-10-CM | POA: Diagnosis not present

## 2017-01-15 DIAGNOSIS — G9340 Encephalopathy, unspecified: Secondary | ICD-10-CM | POA: Diagnosis not present

## 2017-01-17 DIAGNOSIS — G9340 Encephalopathy, unspecified: Secondary | ICD-10-CM | POA: Diagnosis not present

## 2017-01-18 DIAGNOSIS — G9340 Encephalopathy, unspecified: Secondary | ICD-10-CM | POA: Diagnosis not present

## 2017-01-20 DIAGNOSIS — E1142 Type 2 diabetes mellitus with diabetic polyneuropathy: Secondary | ICD-10-CM | POA: Diagnosis not present

## 2017-01-20 DIAGNOSIS — B351 Tinea unguium: Secondary | ICD-10-CM | POA: Diagnosis not present

## 2017-01-20 DIAGNOSIS — G63 Polyneuropathy in diseases classified elsewhere: Secondary | ICD-10-CM | POA: Diagnosis not present

## 2017-01-22 DIAGNOSIS — G9340 Encephalopathy, unspecified: Secondary | ICD-10-CM | POA: Diagnosis not present

## 2017-01-24 DIAGNOSIS — G9340 Encephalopathy, unspecified: Secondary | ICD-10-CM | POA: Diagnosis not present

## 2017-01-25 DIAGNOSIS — G9340 Encephalopathy, unspecified: Secondary | ICD-10-CM | POA: Diagnosis not present

## 2017-01-31 DIAGNOSIS — G9340 Encephalopathy, unspecified: Secondary | ICD-10-CM | POA: Diagnosis not present

## 2017-02-02 DIAGNOSIS — G9340 Encephalopathy, unspecified: Secondary | ICD-10-CM | POA: Diagnosis not present

## 2017-02-03 DIAGNOSIS — G9340 Encephalopathy, unspecified: Secondary | ICD-10-CM | POA: Diagnosis not present

## 2017-02-06 DIAGNOSIS — G9340 Encephalopathy, unspecified: Secondary | ICD-10-CM | POA: Diagnosis not present

## 2017-02-07 DIAGNOSIS — G9340 Encephalopathy, unspecified: Secondary | ICD-10-CM | POA: Diagnosis not present

## 2017-02-08 DIAGNOSIS — G9340 Encephalopathy, unspecified: Secondary | ICD-10-CM | POA: Diagnosis not present

## 2017-02-11 DIAGNOSIS — M545 Low back pain: Secondary | ICD-10-CM | POA: Diagnosis not present

## 2017-02-11 DIAGNOSIS — G8929 Other chronic pain: Secondary | ICD-10-CM | POA: Diagnosis not present

## 2017-02-11 DIAGNOSIS — G894 Chronic pain syndrome: Secondary | ICD-10-CM | POA: Diagnosis not present

## 2017-02-11 DIAGNOSIS — M1000 Idiopathic gout, unspecified site: Secondary | ICD-10-CM | POA: Diagnosis not present

## 2017-02-28 DIAGNOSIS — I5030 Unspecified diastolic (congestive) heart failure: Secondary | ICD-10-CM | POA: Diagnosis not present

## 2017-02-28 DIAGNOSIS — M1990 Unspecified osteoarthritis, unspecified site: Secondary | ICD-10-CM | POA: Diagnosis not present

## 2017-02-28 DIAGNOSIS — R609 Edema, unspecified: Secondary | ICD-10-CM | POA: Diagnosis not present

## 2017-03-09 DIAGNOSIS — E119 Type 2 diabetes mellitus without complications: Secondary | ICD-10-CM | POA: Diagnosis not present

## 2017-03-11 DIAGNOSIS — G8929 Other chronic pain: Secondary | ICD-10-CM | POA: Diagnosis not present

## 2017-03-11 DIAGNOSIS — M1000 Idiopathic gout, unspecified site: Secondary | ICD-10-CM | POA: Diagnosis not present

## 2017-03-11 DIAGNOSIS — G894 Chronic pain syndrome: Secondary | ICD-10-CM | POA: Diagnosis not present

## 2017-03-11 DIAGNOSIS — M545 Low back pain: Secondary | ICD-10-CM | POA: Diagnosis not present

## 2017-03-21 ENCOUNTER — Ambulatory Visit: Payer: Medicare Other | Admitting: Interventional Cardiology

## 2017-03-22 ENCOUNTER — Ambulatory Visit: Payer: Medicare Other | Admitting: Interventional Cardiology

## 2017-03-27 ENCOUNTER — Encounter: Payer: Self-pay | Admitting: *Deleted

## 2017-03-28 DIAGNOSIS — R609 Edema, unspecified: Secondary | ICD-10-CM | POA: Diagnosis not present

## 2017-03-28 DIAGNOSIS — I5030 Unspecified diastolic (congestive) heart failure: Secondary | ICD-10-CM | POA: Diagnosis not present

## 2017-03-28 DIAGNOSIS — N183 Chronic kidney disease, stage 3 (moderate): Secondary | ICD-10-CM | POA: Diagnosis not present

## 2017-03-28 DIAGNOSIS — M1990 Unspecified osteoarthritis, unspecified site: Secondary | ICD-10-CM | POA: Diagnosis not present

## 2017-04-04 DIAGNOSIS — Z950 Presence of cardiac pacemaker: Secondary | ICD-10-CM | POA: Diagnosis not present

## 2017-04-04 DIAGNOSIS — I5030 Unspecified diastolic (congestive) heart failure: Secondary | ICD-10-CM | POA: Diagnosis not present

## 2017-04-04 DIAGNOSIS — R609 Edema, unspecified: Secondary | ICD-10-CM | POA: Diagnosis not present

## 2017-04-04 DIAGNOSIS — N183 Chronic kidney disease, stage 3 (moderate): Secondary | ICD-10-CM | POA: Diagnosis not present

## 2017-04-12 ENCOUNTER — Ambulatory Visit (INDEPENDENT_AMBULATORY_CARE_PROVIDER_SITE_OTHER): Payer: Medicare Other | Admitting: Interventional Cardiology

## 2017-04-12 ENCOUNTER — Ambulatory Visit: Payer: Medicare Other | Admitting: Interventional Cardiology

## 2017-04-12 ENCOUNTER — Ambulatory Visit (INDEPENDENT_AMBULATORY_CARE_PROVIDER_SITE_OTHER): Payer: Medicare Other | Admitting: Internal Medicine

## 2017-04-12 ENCOUNTER — Encounter: Payer: Self-pay | Admitting: Interventional Cardiology

## 2017-04-12 ENCOUNTER — Encounter: Payer: Self-pay | Admitting: Internal Medicine

## 2017-04-12 VITALS — BP 122/64 | HR 103 | Ht 61.0 in

## 2017-04-12 DIAGNOSIS — I10 Essential (primary) hypertension: Secondary | ICD-10-CM

## 2017-04-12 DIAGNOSIS — Z950 Presence of cardiac pacemaker: Secondary | ICD-10-CM | POA: Diagnosis not present

## 2017-04-12 DIAGNOSIS — I5032 Chronic diastolic (congestive) heart failure: Secondary | ICD-10-CM

## 2017-04-12 DIAGNOSIS — R600 Localized edema: Secondary | ICD-10-CM

## 2017-04-12 DIAGNOSIS — R609 Edema, unspecified: Secondary | ICD-10-CM

## 2017-04-12 DIAGNOSIS — I4430 Unspecified atrioventricular block: Secondary | ICD-10-CM

## 2017-04-12 NOTE — Patient Instructions (Signed)
Medication Instructions:  Your physician recommends that you continue on your current medications as directed. Please refer to the Current Medication list given to you today.   Labwork: BMET  Testing/Procedures: None ordered.  Follow-Up: Your physician wants you to follow-up in: 6 months with Dr. Eldridge Dace. You will receive a reminder letter in the mail two months in advance. If you don't receive a letter, please call our office to schedule the follow-up appointment.   Any Other Special Instructions Will Be Listed Below (If Applicable).     If you need a refill on your cardiac medications before your next appointment, please call your pharmacy.

## 2017-04-12 NOTE — Patient Instructions (Addendum)
Medication Instructions:  Your physician recommends that you continue on your current medications as directed. Please refer to the Current Medication list given to you today.   Labwork: None Ordered   Testing/Procedures:  None Ordered    Follow-Up: Your physician wants you to follow-up in: 1 year with Dr. Taylor. You will receive a reminder letter in the mail two months in advance. If you don't receive a letter, please call our office to schedule the follow-up appointment.  Remote monitoring is used to monitor your Pacemaker from home. This monitoring reduces the number of office visits required to check your device to one time per year. It allows us to keep an eye on the functioning of your device to ensure it is working properly. You are scheduled for a device check from home on 07/12/17. You may send your transmission at any time that day. If you have a wireless device, the transmission will be sent automatically. After your physician reviews your transmission, you will receive a postcard with your next transmission date.     Any Other Special Instructions Will Be Listed Below (If Applicable).     If you need a refill on your cardiac medications before your next appointment, please call your pharmacy.  

## 2017-04-12 NOTE — Progress Notes (Signed)
HPI  [name] returns today for followup. She is a very pleasant 82 year old woman with a history of hypertension and symptomatic 2:1 heart block, now CHB, status post pacemaker insertion. She has a history of chronic lower extremity venous stasis and cellulitis and has developed severe cramps in her legs and is now unable to walk. She is bed bound. She has not been able to keep her edema controlled recently but was seen earlier today and her lasix increased.  Allergies  Allergen Reactions  . Aspirin Rash and Other (See Comments)    GI distress  . Tramadol Other (See Comments)    GI distress  . Naproxen      Current Outpatient Prescriptions  Medication Sig Dispense Refill  . acetaminophen-codeine (TYLENOL #4) 300-60 MG tablet Take 1 tablet by mouth every 8 (eight) hours as needed for pain.     [name] allopurinol (ZYLOPRIM) 100 MG tablet Take 200 mg by mouth daily.    . carvedilol (COREG) 6.25 MG tablet Take 6.25 mg by mouth 2 (two) times daily with a meal.    . cholecalciferol (VITAMIN D) 1000 UNITS tablet Take 1,000 Units by mouth daily.    . diazepam (VALIUM) 5 MG tablet Take 2.5 mg by mouth 2 (two) times daily as needed for anxiety or muscle spasms. For muscle spasms    . furosemide (LASIX) 20 MG tablet Take 40 mg by mouth 2 (two) times daily.    [name] gabapentin (NEURONTIN) 300 MG capsule Take 1 capsule (300 mg total) by mouth 3 (three) times daily. 60 capsule 0  . HYDROcodone-acetaminophen (NORCO/VICODIN) 5-325 MG tablet Take 2 tablets by mouth every 4 (four) hours as needed. AS NEEDED FOR PAIN    . loratadine (CLARITIN) 10 MG tablet Take 10 mg by mouth daily.    . magnesium oxide (MAG-OX) 400 (241.3 MG) MG tablet TAKE ONE TABLET BY MOUTH ONE TIME DAILY 30 tablet 4  . Menthol, Topical Analgesic, (BIOFREEZE COLORLESS EX) Apply 1 application topically 2 (two) times daily.    . metFORMIN (GLUCOPHAGE) 500 MG tablet Take by mouth daily with breakfast.    . metolazone (ZAROXOLYN) 2.5 MG  tablet Take 2.5 mg by mouth once a week. TAKE EVERY OTHER DAY 30 MINUTES BEFORE LASIX    . Multiple Vitamins-Minerals (CERTA-VITE PO) Take 1 tablet by mouth daily.    . nitroGLYCERIN (NITROSTAT) 0.4 MG SL tablet Place 0.4 mg under the tongue every 5 (five) minutes as needed for chest pain (X 3 DOSES FOR CHEST PAIN).    [name] polyethylene glycol powder (GLYCOLAX/MIRALAX) powder Take 17 g by mouth daily.    . potassium chloride SA (K-DUR,KLOR-CON) 20 MEQ tablet Take 40 mEq by mouth daily.      No current facility-administered medications for this visit.      Past Medical History:  Diagnosis Date  . Arthritis    "qwhere"  . AV block     2:1  January 14, 2011 / pacemaker plan when cellulitis resolved in length  . Bradycardia    2:1  AV block  . Cellulitis    Superficial cellulitis right lower leg   January 14, 2011  . Chronic diastolic CHF (congestive heart failure) (HCC)   . Diabetes mellitus without complication (HCC)   . Edema    . EF 65-70%, echo, November 29, 2010  . Ejection fraction    EF 65%, echo, January, 2012  . Hypertension   . Hypokalemia    October, 2012  .  Murmur    No significant valvular disease by echo, January, 2012  . Pacemaker    January, 2013 Medtronic Sensa  . Pulmonary hypertension (HCC)     46 mmHg... echo... January, 2012    ROS:   All systems reviewed and negative except as noted in the HPI.   Past Surgical History:  Procedure Laterality Date  . CHOLECYSTECTOMY    . HIP ARTHROPLASTY Right   . INSERT / REPLACE / REMOVE PACEMAKER  11/2011  . PARTIAL HYSTERECTOMY     vaginal  . PERMANENT PACEMAKER INSERTION N/A 12/09/2011   Procedure: PERMANENT PACEMAKER INSERTION;  Surgeon: Marinus Maw, MD;  Location: [facility] CATH LAB;  Service: Cardiovascular;  Laterality: N/A;     Family History  Problem Relation Age of Onset  . Cardiomyopathy Father   . Diabetes Father   . Heart failure Father   . Dementia Mother   . Cancer Son   . Coronary artery disease  Son   . Heart attack Neg Hx      Social History   Social History  . Marital status: Widowed    Spouse name: N/A  . Number of children: N/A  . Years of education: N/A   Occupational History  . Retired Retired   Social History Main Topics  . Smoking status: Never Smoker  . Smokeless tobacco: Never Used  . Alcohol use No  . Drug use: No  . Sexual activity: No   Other Topics Concern  . Not on file   Social History Narrative   Retired    Widowed    Tobacco Use - No.    Alcohol Use - no   Regular Exercise - no   Drug Use - no     BP 122/64   Pulse (!) 103   Ht  (1.549 m)   SpO2 96%   Physical Exam:  stable appearing morbidly obese woman, NAD HEENT: Unremarkable Neck:  7 cm JVD, no thyromegally Back:  No CVA tenderness Lungs:  Clear with no wheezes, rales, or rhonchi. HEART:  Regular rate rhythm, no murmurs, no rubs, no clicks, split S2. Abd:  soft, positive bowel sounds, no organomegally, no rebound, no guarding Ext:  2 plus pulses, trace peripheral edema, no cyanosis, no clubbing Skin:  No rashes no nodules Neuro:  CN II through XII intact, motor grossly intact  DEVICE  Normal device function.  See PaceArt for details.   Assess/Plan: 1. Complete heart block - she is stable, s/p PPM 2. HTN - her blood pressure has been reasonably well controlled. She is encouraged to maintain a low sodium diet. 3. PPM - her medtronic DDD PM is working normally. Will recheck in a year.  4. Diastolic heart failure - she has had her dose of lasix increased. She will maintain a low sodium diet.  Leonia Reeves.D.

## 2017-04-12 NOTE — Progress Notes (Signed)
Cardiology Office Note   Date:  04/12/2017   ID:  [name], DOB Nov 09, 1931, MRN [id number]  PCP:  Patient, No Pcp Per    No chief complaint on file. swelling   Wt Readings from Last 3 Encounters:  12/15/16 215 lb (97.5 kg)  11/24/16 215 lb (97.5 kg)  06/07/16 186 lb (84.4 kg)       History of Present Illness: [name] is a 83 y.o. female  With chronic diastolic heart failure and permanent pacemaker placement. She is wheelchair-bound.  She is managed with furosemide and metolazone. She thinks she has not been getting as much furosemide as usual. Her hands feel more swollen. It is difficult to weigh her since she does not stand.  Her breathing feels fine. She is not reporting any shortness of breath. She requests that her lab work be checked today. She also does not think she has been getting her potassium as prescribed.    Past Medical History:  Diagnosis Date  . Arthritis    "qwhere"  . AV block     2:1  January 14, 2011 / pacemaker plan when cellulitis resolved in length  . Bradycardia    2:1  AV block  . Cellulitis    Superficial cellulitis right lower leg   January 14, 2011  . Chronic diastolic CHF (congestive heart failure) (HCC)   . Diabetes mellitus without complication (HCC)   . Edema    . EF 65-70%, echo, November 29, 2010  . Ejection fraction    EF 65%, echo, January, 2012  . Hypertension   . Hypokalemia    October, 2012  . Murmur    No significant valvular disease by echo, January, 2012  . Pacemaker    January, 2013 Medtronic Sensa  . Pulmonary hypertension (HCC)     46 mmHg... echo... January, 2012    Past Surgical History:  Procedure Laterality Date  . CHOLECYSTECTOMY    . HIP ARTHROPLASTY Right   . INSERT / REPLACE / REMOVE PACEMAKER  11/2011  . PARTIAL HYSTERECTOMY     vaginal  . PERMANENT PACEMAKER INSERTION N/A 12/09/2011   Procedure: PERMANENT PACEMAKER INSERTION;  Surgeon: Marinus Maw, MD;  Location: [facility] CATH LAB;   Service: Cardiovascular;  Laterality: N/A;     Current Outpatient Prescriptions  Medication Sig Dispense Refill  . acetaminophen-codeine (TYLENOL #4) 300-60 MG tablet Take 1 tablet by mouth every 8 (eight) hours as needed for pain.     [name] allopurinol (ZYLOPRIM) 100 MG tablet Take 200 mg by mouth daily.    . carvedilol (COREG) 6.25 MG tablet Take 6.25 mg by mouth 2 (two) times daily with a meal.    . cholecalciferol (VITAMIN D) 1000 UNITS tablet Take 1,000 Units by mouth daily.    . diazepam (VALIUM) 5 MG tablet Take 2.5 mg by mouth 2 (two) times daily as needed for anxiety or muscle spasms. For muscle spasms    . furosemide (LASIX) 20 MG tablet Take 40 mg by mouth 2 (two) times daily.    [name] gabapentin (NEURONTIN) 300 MG capsule Take 1 capsule (300 mg total) by mouth 3 (three) times daily. 60 capsule 0  . HYDROcodone-acetaminophen (NORCO/VICODIN) 5-325 MG tablet Take 2 tablets by mouth every 4 (four) hours as needed. AS NEEDED FOR PAIN    . loratadine (CLARITIN) 10 MG tablet Take 10 mg by mouth daily.    . magnesium oxide (MAG-OX) 400 (241.3 MG) MG tablet TAKE ONE TABLET  BY MOUTH ONE TIME DAILY 30 tablet 4  . Menthol, Topical Analgesic, (BIOFREEZE COLORLESS EX) Apply 1 application topically 2 (two) times daily.    . metFORMIN (GLUCOPHAGE) 500 MG tablet Take by mouth daily with breakfast.    . metolazone (ZAROXOLYN) 2.5 MG tablet Take 2.5 mg by mouth once a week. TAKE EVERY OTHER DAY 30 MINUTES BEFORE LASIX    . Multiple Vitamins-Minerals (CERTA-VITE PO) Take 1 tablet by mouth daily.    . nitroGLYCERIN (NITROSTAT) 0.4 MG SL tablet Place 0.4 mg under the tongue every 5 (five) minutes as needed for chest pain (X 3 DOSES FOR CHEST PAIN).    [name] polyethylene glycol powder (GLYCOLAX/MIRALAX) powder Take 17 g by mouth daily.    . potassium chloride SA (K-DUR,KLOR-CON) 20 MEQ tablet Take 40 mEq by mouth daily.      No current facility-administered medications for this visit.     Allergies:   Aspirin;  Tramadol; and Naproxen    Social History:  The patient  reports that she has never smoked. She has never used smokeless tobacco. She reports that she does not drink alcohol or use drugs.   Family History:  The patient's family history includes Cancer in her son; Cardiomyopathy in her father; Coronary artery disease in her son; Dementia in her mother; Diabetes in her father; Heart failure in her father.    ROS:  Please see the history of present illness.   Otherwise, review of systems are positive for hands swelling.   All other systems are reviewed and negative.    PHYSICAL EXAM: VS:  BP 122/64   Pulse (!) 103   Ht 5\' 1"  (1.549 m)   SpO2 96%  , BMI There is no height or weight on file to calculate BMI. GEN: Well nourished, well developed, in no acute distress  HEENT: normal  Neck: no JVD, carotid bruits, or masses Cardiac: irregularly irregular; no murmurs, rubs, or gallops,; 1+ bilateral leg edema  Respiratory:  clear to auscultation bilaterally, normal work of breathing GI: soft, nontender, nondistended, + BS MS: no deformity or atrophy  Skin: warm and dry, no rash Neuro:  Strength and sensation are intact Psych: euthymic mood, full affect    Recent Labs: 11/24/2016: BUN 18; Creatinine, Ser 0.69; NT-Pro BNP 616; Platelets WILL FOLLOW; Platelets 312; Potassium 4.4; Sodium 144   Lipid Panel No results found for: CHOL, TRIG, HDL, CHOLHDL, VLDL, LDLCALC, LDLDIRECT   Other studies Reviewed: Additional studies/ records that were reviewed today with results demonstrating: prior note from [facility] reviewed.   ASSESSMENT AND PLAN:  1. Chronic diastolic heart failure: She needs to continue on Lasix 40 mg twice a day. Depending on her bmet results today, may increase the dose for a short period of time to help with additional fluid retention. We'll also need to adjust potassium dose depending on today's result.  2. I checked on her pacemaker report. She is not in atrial  fibrillation despite the irregular rhythm noted on exam.  She may be having premature beats. 3. Hypertension: Blood pressure controlled. Continue current pressure medicines. 4. I also made a note to the nursing home that if she is traveling to a doctor's appointment, she can hold the morning dose of Lasix. In addition, the second dose of Lasix should not be given at 8 PM rather at about 2 PM so that she does not have to urinate at night. 5. Obesity: This is an issue which makes her diastolic failure worse. It will  be difficult for her to lose weight since she is wheelchair-bound. Careful monitoring of diet. Minimize salt intake.   Current medicines are reviewed at length with the patient today.  The patient concerns regarding her medicines were addressed.  The following changes have been made:  No change  Labs/ tests ordered today include:  Orders Placed This Encounter  Procedures  . Basic Metabolic Panel (BMET)    Recommend 150 minutes/week of aerobic exercise Low fat, low carb, high fiber diet recommended  Disposition:   FU in 6 months   Signed, Lance Muss, MD  04/12/2017 5:21 PM    [facility] [address] [place], [place], [place]  [address] Phone: [phone]; Fax: [phone]

## 2017-04-12 NOTE — Progress Notes (Unsigned)
Cardiology Office Note   Date:  04/12/2017   ID:  [name], DOB [birth year], MRN [id number]  PCP:  Patient, No Pcp Per    No chief complaint on file. chronic diastolic heart failure   Wt Readings from Last 3 Encounters:  12/15/16 215 lb (97.5 kg)  11/24/16 215 lb (97.5 kg)  06/07/16 186 lb (84.4 kg)       History of Present Illness: [name] is a 83 y.o. female  Who has chronic diastolic heart failure.   She was followed by Dr. Myrtis Ser for many years. SHe cannot stand so it is difficult to get daily weights.    Leg edema better of late.  She takes Lasix twice a day, but skipped this AM dose because she was coming here.  SHe has metolazone on her MAR. She denies any shortness of breath.  She feels her hands are more swollen.  Her facility gives her medicines.  She thinks sometimes she does not get the meds on time and that causes some fluid retention.    She would like her electrolytes checked today.     Past Medical History:  Diagnosis Date  . Arthritis    "qwhere"  . AV block     2:1  January 14, 2011 / pacemaker plan when cellulitis resolved in length  . Bradycardia    2:1  AV block  . Cellulitis    Superficial cellulitis right lower leg   January 14, 2011  . Chronic diastolic CHF (congestive heart failure) (HCC)   . Diabetes mellitus without complication (HCC)   . Edema    . EF 65-70%, echo, November 29, 2010  . Ejection fraction    EF 65%, echo, January, 2012  . Hypertension   . Hypokalemia    October, 2012  . Murmur    No significant valvular disease by echo, January, 2012  . Pacemaker    January, 2013 Medtronic Sensa  . Pulmonary hypertension (HCC)     46 mmHg... echo... January, 2012    Past Surgical History:  Procedure Laterality Date  . CHOLECYSTECTOMY    . HIP ARTHROPLASTY Right   . INSERT / REPLACE / REMOVE PACEMAKER  11/2011  . PARTIAL HYSTERECTOMY     vaginal  . PERMANENT PACEMAKER INSERTION N/A 12/09/2011   Procedure:  PERMANENT PACEMAKER INSERTION;  Surgeon: Marinus Maw, MD;  Location: [facility] CATH LAB;  Service: Cardiovascular;  Laterality: N/A;     Current Outpatient Prescriptions  Medication Sig Dispense Refill  . acetaminophen-codeine (TYLENOL #4) 300-60 MG tablet Take 1 tablet by mouth every 8 (eight) hours as needed for pain.     [name] allopurinol (ZYLOPRIM) 100 MG tablet Take 200 mg by mouth daily.    . carvedilol (COREG) 6.25 MG tablet Take 6.25 mg by mouth 2 (two) times daily with a meal.    . cholecalciferol (VITAMIN D) 1000 UNITS tablet Take 1,000 Units by mouth daily.    . diazepam (VALIUM) 5 MG tablet Take 2.5 mg by mouth 2 (two) times daily as needed for anxiety or muscle spasms. For muscle spasms    . furosemide (LASIX) 20 MG tablet Take 40 mg by mouth 2 (two) times daily.    [name] gabapentin (NEURONTIN) 300 MG capsule Take 1 capsule (300 mg total) by mouth 3 (three) times daily. 60 capsule 0  . HYDROcodone-acetaminophen (NORCO/VICODIN) 5-325 MG tablet Take 2 tablets by mouth every 4 (four) hours as needed. AS NEEDED FOR PAIN    .  loratadine (CLARITIN) 10 MG tablet Take 10 mg by mouth daily.    . magnesium oxide (MAG-OX) 400 (241.3 MG) MG tablet TAKE ONE TABLET BY MOUTH ONE TIME DAILY 30 tablet 4  . Menthol, Topical Analgesic, (BIOFREEZE COLORLESS EX) Apply 1 application topically 2 (two) times daily.    . metFORMIN (GLUCOPHAGE) 500 MG tablet Take by mouth daily with breakfast.    . metolazone (ZAROXOLYN) 2.5 MG tablet Take 2.5 mg by mouth once a week. TAKE EVERY OTHER DAY 30 MINUTES BEFORE LASIX    . Multiple Vitamins-Minerals (CERTA-VITE PO) Take 1 tablet by mouth daily.    . nitroGLYCERIN (NITROSTAT) 0.4 MG SL tablet Place 0.4 mg under the tongue every 5 (five) minutes as needed for chest pain (X 3 DOSES FOR CHEST PAIN).    [name] polyethylene glycol powder (GLYCOLAX/MIRALAX) powder Take 17 g by mouth daily.    . potassium chloride SA (K-DUR,KLOR-CON) 20 MEQ tablet Take 40 mEq by mouth daily.      No  current facility-administered medications for this visit.     Allergies:   Aspirin; Tramadol; and Naproxen    Social History:  The patient  reports that she has never smoked. She has never used smokeless tobacco. She reports that she does not drink alcohol or use drugs.   Family History:  The patient's family history includes Cancer in her son; Cardiomyopathy in her father; Coronary artery disease in her son; Dementia in her mother; Diabetes in her father; Heart failure in her father.    ROS:  Please see the history of present illness.   Otherwise, review of systems are positive for leg swelling.   All other systems are reviewed and negative.    PHYSICAL EXAM: VS:  There were no vitals taken for this visit. , BMI There is no height or weight on file to calculate BMI. GEN: Well nourished, well developed, in no acute distress , wheelchair-bound HEENT: normal  Neck: no JVD, carotid bruits, or masses Cardiac: RRR; no murmurs, rubs, or gallops, bilateral ankle edema  Respiratory:  clear to auscultation bilaterally, normal work of breathing GI: soft, nontender, nondistended, + BS MS: no deformity or atrophy  Skin: warm and dry, no rash Neuro:  Strength and sensation are intact Psych: euthymic mood, full affect   EKG:   The ekg ordered in 2/17 demonstrates paced rhythm   Recent Labs: 11/24/2016: BUN 18; Creatinine, Ser 0.69; NT-Pro BNP 616; Platelets WILL FOLLOW; Platelets 312; Potassium 4.4; Sodium 144   Lipid Panel No results found for: CHOL, TRIG, HDL, CHOLHDL, VLDL, LDLCALC, LDLDIRECT   Other studies Reviewed: Additional studies/ records that were reviewed today with results demonstrating: normal LV function on most recent echocardiogram.   ASSESSMENT AND PLAN:  1. Chronic diastolic heart failure: Continue Lasix. May need to have metolazone 2.5 mg once a week when necessary if she has excessive swelling. 2. Edema: BNP was normal in September 2016. Some of her edema is likely  related to venous insufficiency. Elevate legs as much as possible. 3. Pacemaker: Followed by Dr. Ladona Ridgel   Current medicines are reviewed at length with the patient today.  The patient concerns regarding her medicines were addressed.  The following changes have been made:  No change  Labs/ tests ordered today include:  No orders of the defined types were placed in this encounter.   Recommend 150 minutes/week of aerobic exercise Low fat, low carb, high fiber diet recommended  Disposition:   FU in 1 year  Signed, Lance Muss, MD  04/12/2017 4:04 PM    [facility] [address] [place], [place], [place]  [address] Phone: [phone]; Fax: [phone]

## 2017-04-13 ENCOUNTER — Telehealth: Payer: Self-pay

## 2017-04-13 LAB — BASIC METABOLIC PANEL
BUN/Creatinine Ratio: 30 — ABNORMAL HIGH (ref 12–28)
BUN: 28 mg/dL — ABNORMAL HIGH (ref 8–27)
CALCIUM: 10.2 mg/dL (ref 8.7–10.3)
CO2: 29 mmol/L (ref 18–29)
CREATININE: 0.92 mg/dL (ref 0.57–1.00)
Chloride: 82 mmol/L — ABNORMAL LOW (ref 96–106)
GFR calc non Af Amer: 58 mL/min/{1.73_m2} — ABNORMAL LOW (ref >59)
GFR, EST AFRICAN AMERICAN: 67 mL/min/{1.73_m2} (ref >59)
Glucose: 113 mg/dL — ABNORMAL HIGH (ref 65–99)
Potassium: 2.9 mmol/L — ABNORMAL LOW (ref 3.5–5.2)
SODIUM: 130 mmol/L — AB (ref 134–144)

## 2017-04-13 NOTE — Telephone Encounter (Signed)
Spoke with the nursing supervisor at [facility]. Made her aware that the patient was seen in our office yesterday and stated that she was not receiving her potassium. Made the nursing supervisor aware that the patient's Potassium was 2.9 on her labs from yesterday and that the patient needs to restart her potassium daily per the orders sent with the patient yesterday. She states that she re-ordered the potassium this morning and that the patient will start taking it. I let her know that the patient will require a repeat BMET in 1 week. She states that they can draw the labs there and will fax over the results to [place].

## 2017-04-13 NOTE — Telephone Encounter (Signed)
Patient was seen in the office yesterday and she states that she has not been receiving her potassium. Orders sent back with patient yesterday to make sure the patient is taking Potassium QD. BMET checked yesterday and results today showing Potassium- 2.9.   Called to speak to the nurse caring the the patient regarding her potassium being low. Nurse states that they will call the office back.

## 2017-04-19 ENCOUNTER — Ambulatory Visit: Payer: Medicare Other | Admitting: Interventional Cardiology

## 2017-04-20 DIAGNOSIS — Z79899 Other long term (current) drug therapy: Secondary | ICD-10-CM | POA: Diagnosis not present

## 2017-05-04 NOTE — Telephone Encounter (Signed)
Called to follow up with the nursing supervisor at [facility] regarding patient's repeat BMET. She states that the patient is taking the potassium 40 meq QD and that her potassium recheck was 3.8 on 5/31 and that she will fax over the results.

## 2017-05-09 DIAGNOSIS — I10 Essential (primary) hypertension: Secondary | ICD-10-CM | POA: Diagnosis not present

## 2017-05-09 DIAGNOSIS — E559 Vitamin D deficiency, unspecified: Secondary | ICD-10-CM | POA: Diagnosis not present

## 2017-05-09 DIAGNOSIS — T7849XD Other allergy, subsequent encounter: Secondary | ICD-10-CM | POA: Diagnosis not present

## 2017-05-09 DIAGNOSIS — G8929 Other chronic pain: Secondary | ICD-10-CM | POA: Diagnosis not present

## 2017-05-13 DIAGNOSIS — G894 Chronic pain syndrome: Secondary | ICD-10-CM | POA: Diagnosis not present

## 2017-05-13 DIAGNOSIS — M1000 Idiopathic gout, unspecified site: Secondary | ICD-10-CM | POA: Diagnosis not present

## 2017-05-13 DIAGNOSIS — M545 Low back pain: Secondary | ICD-10-CM | POA: Diagnosis not present

## 2017-05-13 DIAGNOSIS — G8929 Other chronic pain: Secondary | ICD-10-CM | POA: Diagnosis not present

## 2017-05-17 DIAGNOSIS — G8929 Other chronic pain: Secondary | ICD-10-CM | POA: Diagnosis not present

## 2017-05-17 DIAGNOSIS — R609 Edema, unspecified: Secondary | ICD-10-CM | POA: Diagnosis not present

## 2017-05-17 DIAGNOSIS — E559 Vitamin D deficiency, unspecified: Secondary | ICD-10-CM | POA: Diagnosis not present

## 2017-05-17 DIAGNOSIS — I10 Essential (primary) hypertension: Secondary | ICD-10-CM | POA: Diagnosis not present

## 2017-06-01 DIAGNOSIS — M109 Gout, unspecified: Secondary | ICD-10-CM | POA: Insufficient documentation

## 2017-06-01 DIAGNOSIS — R262 Difficulty in walking, not elsewhere classified: Secondary | ICD-10-CM | POA: Insufficient documentation

## 2017-06-05 DIAGNOSIS — Z794 Long term (current) use of insulin: Secondary | ICD-10-CM | POA: Diagnosis not present

## 2017-06-05 DIAGNOSIS — B351 Tinea unguium: Secondary | ICD-10-CM | POA: Diagnosis not present

## 2017-06-05 DIAGNOSIS — R600 Localized edema: Secondary | ICD-10-CM | POA: Diagnosis not present

## 2017-06-05 DIAGNOSIS — E1140 Type 2 diabetes mellitus with diabetic neuropathy, unspecified: Secondary | ICD-10-CM | POA: Diagnosis not present

## 2017-06-06 DIAGNOSIS — Z79899 Other long term (current) drug therapy: Secondary | ICD-10-CM | POA: Diagnosis not present

## 2017-06-09 DIAGNOSIS — E119 Type 2 diabetes mellitus without complications: Secondary | ICD-10-CM | POA: Diagnosis not present

## 2017-06-17 DIAGNOSIS — M1000 Idiopathic gout, unspecified site: Secondary | ICD-10-CM | POA: Diagnosis not present

## 2017-06-17 DIAGNOSIS — G8929 Other chronic pain: Secondary | ICD-10-CM | POA: Diagnosis not present

## 2017-06-17 DIAGNOSIS — M545 Low back pain: Secondary | ICD-10-CM | POA: Diagnosis not present

## 2017-06-17 DIAGNOSIS — G894 Chronic pain syndrome: Secondary | ICD-10-CM | POA: Diagnosis not present

## 2017-07-14 DIAGNOSIS — E559 Vitamin D deficiency, unspecified: Secondary | ICD-10-CM | POA: Diagnosis not present

## 2017-07-14 DIAGNOSIS — I10 Essential (primary) hypertension: Secondary | ICD-10-CM | POA: Diagnosis not present

## 2017-07-14 DIAGNOSIS — G8929 Other chronic pain: Secondary | ICD-10-CM | POA: Diagnosis not present

## 2017-07-14 DIAGNOSIS — E119 Type 2 diabetes mellitus without complications: Secondary | ICD-10-CM | POA: Diagnosis not present

## 2017-07-15 DIAGNOSIS — M1000 Idiopathic gout, unspecified site: Secondary | ICD-10-CM | POA: Diagnosis not present

## 2017-07-15 DIAGNOSIS — G894 Chronic pain syndrome: Secondary | ICD-10-CM | POA: Diagnosis not present

## 2017-07-15 DIAGNOSIS — M545 Low back pain: Secondary | ICD-10-CM | POA: Diagnosis not present

## 2017-07-15 DIAGNOSIS — G8929 Other chronic pain: Secondary | ICD-10-CM | POA: Diagnosis not present

## 2017-08-07 DIAGNOSIS — Z79899 Other long term (current) drug therapy: Secondary | ICD-10-CM | POA: Diagnosis not present

## 2017-08-12 DIAGNOSIS — G8929 Other chronic pain: Secondary | ICD-10-CM | POA: Diagnosis not present

## 2017-08-12 DIAGNOSIS — M545 Low back pain: Secondary | ICD-10-CM | POA: Diagnosis not present

## 2017-08-12 DIAGNOSIS — M1000 Idiopathic gout, unspecified site: Secondary | ICD-10-CM | POA: Diagnosis not present

## 2017-08-12 DIAGNOSIS — G894 Chronic pain syndrome: Secondary | ICD-10-CM | POA: Diagnosis not present

## 2017-08-15 ENCOUNTER — Telehealth: Payer: Self-pay | Admitting: Internal Medicine

## 2017-08-15 NOTE — Telephone Encounter (Signed)
NEw Message  Pt daughter would like to know if pt needs to keep f/u for November if she came in for an office visit in May. Please call abck to discuss

## 2017-08-15 NOTE — Telephone Encounter (Signed)
Spoke with patient's daughter [name] (DPR on file) and made her aware that when the patient saw Dr. Eldridge Dace in May he wanted her to come back for follow-up in 6 months. Patient is already scheduled on 11/20 at 10:00 AM. Daughter verbalized understanding and thanked me for the call.

## 2017-08-21 ENCOUNTER — Emergency Department (HOSPITAL_COMMUNITY): Payer: Medicare Other

## 2017-08-21 ENCOUNTER — Emergency Department (HOSPITAL_COMMUNITY)
Admission: EM | Admit: 2017-08-21 | Discharge: 2017-08-21 | Disposition: A | Discharge status: Home / Self Care | Payer: Medicare Other | Attending: Emergency Medicine | Admitting: Emergency Medicine

## 2017-08-21 ENCOUNTER — Encounter (HOSPITAL_COMMUNITY): Payer: Self-pay | Admitting: Emergency Medicine

## 2017-08-21 DIAGNOSIS — Z96641 Presence of right artificial hip joint: Secondary | ICD-10-CM | POA: Diagnosis not present

## 2017-08-21 DIAGNOSIS — Z950 Presence of cardiac pacemaker: Secondary | ICD-10-CM | POA: Diagnosis not present

## 2017-08-21 DIAGNOSIS — R609 Edema, unspecified: Secondary | ICD-10-CM | POA: Diagnosis present

## 2017-08-21 DIAGNOSIS — Z79899 Other long term (current) drug therapy: Secondary | ICD-10-CM | POA: Insufficient documentation

## 2017-08-21 DIAGNOSIS — L299 Pruritus, unspecified: Secondary | ICD-10-CM | POA: Insufficient documentation

## 2017-08-21 DIAGNOSIS — I5033 Acute on chronic diastolic (congestive) heart failure: Secondary | ICD-10-CM | POA: Insufficient documentation

## 2017-08-21 DIAGNOSIS — Z7984 Long term (current) use of oral hypoglycemic drugs: Secondary | ICD-10-CM | POA: Diagnosis not present

## 2017-08-21 DIAGNOSIS — Z7401 Bed confinement status: Secondary | ICD-10-CM | POA: Diagnosis not present

## 2017-08-21 DIAGNOSIS — R0602 Shortness of breath: Secondary | ICD-10-CM | POA: Diagnosis not present

## 2017-08-21 DIAGNOSIS — J9811 Atelectasis: Secondary | ICD-10-CM | POA: Diagnosis not present

## 2017-08-21 DIAGNOSIS — R279 Unspecified lack of coordination: Secondary | ICD-10-CM | POA: Diagnosis not present

## 2017-08-21 LAB — I-STAT CHEM 8, ED
BUN: 20 mg/dL (ref 6–20)
CREATININE: 0.7 mg/dL (ref 0.44–1.00)
Calcium, Ion: 1.1 mmol/L — ABNORMAL LOW (ref 1.15–1.40)
Chloride: 97 mmol/L — ABNORMAL LOW (ref 101–111)
GLUCOSE: 145 mg/dL — AB (ref 65–99)
HEMATOCRIT: 43 % (ref 36.0–46.0)
HEMOGLOBIN: 14.6 g/dL (ref 12.0–15.0)
Potassium: 4 mmol/L (ref 3.5–5.1)
Sodium: 136 mmol/L (ref 135–145)
TCO2: 31 mmol/L (ref 22–32)

## 2017-08-21 MED ORDER — DIPHENHYDRAMINE HCL 25 MG PO CAPS: 25.0000 mg | ORAL_CAPSULE | Freq: Four times a day (QID) | ORAL | 0 refills | Status: DC | PRN

## 2017-08-21 MED ORDER — DIPHENHYDRAMINE HCL 25 MG PO CAPS
25.0000 mg | ORAL_CAPSULE | Freq: Once | ORAL | Status: AC
  Administered 2017-08-21: 25 mg via ORAL
  Filled 2017-08-21: qty 1

## 2017-08-21 NOTE — ED Notes (Signed)
Pt esigned, but machine did not record signature. Pt did verbalize understanding.

## 2017-08-21 NOTE — ED Provider Notes (Signed)
Medical screening examination/treatment/procedure(s) were conducted as a shared visit with non-physician practitioner(s) and myself.  I personally evaluated the patient during the encounter.  Patient apparently had some foot itching after taking a medication and wanted to come here to be checked out for an allergic reaction. On review of systems she also stated she had some shortness of breath that was near baseline but also with cough and postnasal drainage with congestion for about 3 weeks. No fevers during that time. No purulent discharge. On my exam she has no sinus tenderness, nasal exam is normal. She has mild posterior oropharyngeal erythema. This likely related allergies we'll start benadryl, I don't think she needs antibiotics at this time. Foot looks slightly edematous versus just adipose tissue this along with her complaining of maybe having some shortness of breath, we will check cxr and ecg to make sure that she is not having any worsening heart failure. Her vital signs are reassuring she did have one documented soft blood pressure, but on my evaluation patient had bounding pulses with a blood pressure 132/75 so I suspect this may be a spurious reading.     Marily Memos, MD 08/21/17 [phone]

## 2017-08-21 NOTE — ED Provider Notes (Signed)
[facility]-EMERGENCY DEPT Provider Note   CSN: [id number] Arrival date & time: 08/21/17  0857     History   Chief Complaint Chief Complaint  Patient presents with  . Foot Pain    HPI [name] is a 83 y.o. female , nursing home patient with a history of DM, peripheral edema and pulmonary htn presenting with left foot itching which started this am shortly after receiving her regular home medicines.  She denies foot pain, worsened swelling, fevers, chills or trauma in the foot.  She is bed and wheelchair bound at baseline.  She has had no treatment prior to arrival but the itching has improved after praying over it. She is concerned she may be having an allergic reaction to a medicine and wants to get "checked out for this".  She denies sob, swelling, wheezing or cough and no facial or mouth swelling.   HPI  Past Medical History:  Diagnosis Date  . Arthritis    "qwhere"  . AV block     2:1  January 14, 2011 / pacemaker plan when cellulitis resolved in length  . Bradycardia    2:1  AV block  . Cellulitis    Superficial cellulitis right lower leg   January 14, 2011  . Chronic diastolic CHF (congestive heart failure) (HCC)   . Diabetes mellitus without complication (HCC)   . Edema    . EF 65-70%, echo, November 29, 2010  . Ejection fraction    EF 65%, echo, January, 2012  . Hypertension   . Hypokalemia    October, 2012  . Murmur    No significant valvular disease by echo, January, 2012  . Pacemaker    January, 2013 Medtronic Sensa  . Pulmonary hypertension (HCC)     46 mmHg... echo... January, 2012    Patient Active Problem List   Diagnosis Date Noted  . Acute on chronic diastolic CHF (congestive heart failure) (HCC) 11/24/2016  . Acute encephalopathy 12/31/2015  . Hypoglycemia 12/31/2015  . Chest pain 08/12/2015  . Chronic edema 04/30/2015  . Elevated troponin 04/30/2015  . Peripheral edema 04/30/2015  . Nocturnal leg cramps 04/06/2015  . Pressure in chest  01/13/2015  . Generalized weakness 08/06/2014  . Protein-calorie malnutrition, moderate (HCC) 08/06/2014  . Osteoarthritis 04/29/2014  . Anxiety 04/28/2014  . Neck pain 04/28/2014  . Weakness 04/21/2014  . FTT (failure to thrive) in adult 04/21/2014  . Chronic diastolic CHF (congestive heart failure) (HCC)   . Pacemaker   . Hypertension 12/10/2011  . Cardiac pacemaker 12/10/2011  . Murmur   . Hypokalemia   . Ejection fraction   . Edema   . AV block   . Cellulitis   . Pulmonary hypertension (HCC)     Past Surgical History:  Procedure Laterality Date  . CHOLECYSTECTOMY    . HIP ARTHROPLASTY Right   . INSERT / REPLACE / REMOVE PACEMAKER  11/2011  . PARTIAL HYSTERECTOMY     vaginal  . PERMANENT PACEMAKER INSERTION N/A 12/09/2011   Procedure: PERMANENT PACEMAKER INSERTION;  Surgeon: Marinus Maw, MD;  Location: [facility] CATH LAB;  Service: Cardiovascular;  Laterality: N/A;    OB History    No data available       Home Medications    Prior to Admission medications   Medication Sig Start Date End Date Taking? Authorizing Provider  acetaminophen-codeine (TYLENOL #4) 300-60 MG tablet Take 1 tablet by mouth every 8 (eight) hours as needed for pain.  04/21/16   [provider]  allopurinol (ZYLOPRIM) 100 MG tablet Take 200 mg by mouth daily.    [provider]  carvedilol (COREG) 6.25 MG tablet Take 6.25 mg by mouth 2 (two) times daily with a meal.    [provider]  cholecalciferol (VITAMIN D) 1000 UNITS tablet Take 1,000 Units by mouth daily.    [provider]  diazepam (VALIUM) 5 MG tablet Take 2.5 mg by mouth 2 (two) times daily as needed for anxiety or muscle spasms. For muscle spasms    [provider]  diphenhydrAMINE (BENADRYL) 25 mg capsule Take 1 capsule (25 mg total) by mouth every 6 (six) hours as needed for itching. 08/21/17   Burgess Amor, PA-C  furosemide (LASIX) 20 MG tablet Take 40 mg by mouth 2 (two) times daily. 12/03/16    [provider]  gabapentin (NEURONTIN) 300 MG capsule Take 1 capsule (300 mg total) by mouth 3 (three) times daily. 05/09/15   Hedges, Tinnie Gens, PA-C  HYDROcodone-acetaminophen (NORCO/VICODIN) 5-325 MG tablet Take 2 tablets by mouth every 4 (four) hours as needed. AS NEEDED FOR PAIN 02/20/17   [provider]  loratadine (CLARITIN) 10 MG tablet Take 10 mg by mouth daily.    [provider]  magnesium oxide (MAG-OX) 400 (241.3 MG) MG tablet TAKE ONE TABLET BY MOUTH ONE TIME DAILY 04/02/15   Luis Abed, MD  Menthol, Topical Analgesic, (BIOFREEZE COLORLESS EX) Apply 1 application topically 2 (two) times daily.    [provider]  metFORMIN (GLUCOPHAGE) 500 MG tablet Take by mouth daily with breakfast.    [provider]  metolazone (ZAROXOLYN) 2.5 MG tablet Take 2.5 mg by mouth once a week. TAKE EVERY OTHER DAY 30 MINUTES BEFORE LASIX    [provider]  Multiple Vitamins-Minerals (CERTA-VITE PO) Take 1 tablet by mouth daily.    [provider]  nitroGLYCERIN (NITROSTAT) 0.4 MG SL tablet Place 0.4 mg under the tongue every 5 (five) minutes as needed for chest pain (X 3 DOSES FOR CHEST PAIN).    [provider]  polyethylene glycol powder (GLYCOLAX/MIRALAX) powder Take 17 g by mouth daily.    [provider]  potassium chloride SA (K-DUR,KLOR-CON) 20 MEQ tablet Take 40 mEq by mouth daily.  06/02/16   [provider]    Family History Family History  Problem Relation Age of Onset  . Cardiomyopathy Father   . Diabetes Father   . Heart failure Father   . Dementia Mother   . Cancer Son   . Coronary artery disease Son   . Heart attack Neg Hx     Social History Social History  Substance Use Topics  . Smoking status: Never Smoker  . Smokeless tobacco: Never Used  . Alcohol use No     Allergies   Aspirin; Tramadol; and Naproxen   Review of Systems Review of Systems  Constitutional: Negative for  fever.  HENT: Negative.  Negative for congestion and sore throat.   Eyes: Negative.   Respiratory: Negative for chest tightness and shortness of breath.   Cardiovascular: Negative for chest pain, palpitations and leg swelling.  Gastrointestinal: Negative for abdominal pain and nausea.  Genitourinary: Negative.   Musculoskeletal: Negative for arthralgias, joint swelling and neck pain.  Skin: Negative.  Negative for color change, rash and wound.  Neurological: Negative for dizziness, weakness, light-headedness, numbness and headaches.  Psychiatric/Behavioral: Negative.      Physical Exam Updated Vital Signs BP 132/75   Pulse 98  Temp 98.1 F (36.7 C) (Oral)   Resp 16   Ht  (1.676 m)   Wt 97.5 kg (215 lb)   SpO2 96%   BMI 34.70 kg/m   Physical Exam  Constitutional: She appears well-developed and well-nourished.  HENT:  Head: Normocephalic and atraumatic.  Eyes: Conjunctivae are normal.  Neck: Normal range of motion.  Cardiovascular: Normal rate and intact distal pulses.   Pulmonary/Chest: Effort normal and breath sounds normal. She has no wheezes.  Abdominal: Soft. Bowel sounds are normal.  Musculoskeletal: Normal range of motion.  Neurological: She is alert.  Skin: Skin is warm and dry. Capillary refill takes less than 2 seconds. No rash noted. No erythema.  Psychiatric: She has a normal mood and affect.  Nursing note and vitals reviewed.    ED Treatments / Results  Labs (all labs ordered are listed, but only abnormal results are displayed) Labs Reviewed  I-STAT CHEM 8, ED - Abnormal; Notable for the following:       Result Value   Chloride 97 (*)    Glucose, Bld 145 (*)    Calcium, Ion 1.10 (*)    All other components within normal limits    EKG  EKG Interpretation None       Radiology Dg Chest 2 View  Result Date: 08/21/2017 CLINICAL DATA:  83 year old female with a history of sinus drainage EXAM: CHEST  2 VIEW COMPARISON:  01/02/2016,  12/31/2015 FINDINGS: Cardiomediastinal silhouette unchanged in size and contour. Low lung volumes accentuating the interstitium. Calcifications of the aortic arch. Unchanged cardiac pacing device on the left chest wall with 2 leads in place. Interlobular septal thickening. Patchy opacity at the right lung base. Airspace opacity at the posterior lung base on the lateral view. IMPRESSION: Low lung volumes with basilar ill-defined opacities, potentially atelectasis, scarring, edema, and/ or consolidation. Correlation with lab values and symptoms may be useful. Unchanged left chest wall cardiac pacing device. Electronically Signed   By: Gilmer Mor D.O.   On: 08/21/2017 10:38    Procedures Procedures (including critical care time)  Medications Ordered in ED Medications  diphenhydrAMINE (BENADRYL) capsule 25 mg (25 mg Oral Given 08/21/17 0959)     Initial Impression / Assessment and Plan / ED Course  I have reviewed the triage vital signs and the nursing notes.  Pertinent labs & imaging results that were available during my care of the patient were reviewed by me and considered in my medical decision making (see chart for details).     At recheck by Dr. Clayborne Dana, pt reports having some sob along with nasal drainage and is concerned about having a sinus infection.  Given h/o CHF, will check ekg and cxr.  Benadryl ordered for itching.   11:12 AM CXR suggests possible mild CHF, after discussion with Dr. Clayborne Dana, will increase lasix x 3 days to tid dosing, then return to prior bid dosing.  Benadryl prn itching.  Prn f/u with pcp if sx persist or worsen.  Final Clinical Impressions(s) / ED Diagnoses   Final diagnoses:  Itching  Shortness of breath    New Prescriptions New Prescriptions   DIPHENHYDRAMINE (BENADRYL) 25 MG CAPSULE    Take 1 capsule (25 mg total) by mouth every 6 (six) hours as needed for itching.     Burgess Amor, PA-C 08/21/17 1113    Mesner, Barbara Cower, MD 08/21/17 [phone]

## 2017-08-21 NOTE — ED Notes (Signed)
EMS has been called 

## 2017-08-21 NOTE — Discharge Instructions (Signed)
Increase your lasix (fluid pill) to 2 tablets (40 mg) three times daily for the next 3 days only, then return to your normal twice daily dosing.  This should help with your shortness of breath.  Take benadryl for itching if this persists.  Your lab work is ok today.

## 2017-08-21 NOTE — ED Triage Notes (Signed)
Pt reports her left foot has been itching all night.  Refused her Neurontin this morning at [facility].  Called EMS herself.

## 2017-08-28 DIAGNOSIS — G8929 Other chronic pain: Secondary | ICD-10-CM | POA: Diagnosis not present

## 2017-08-28 DIAGNOSIS — I10 Essential (primary) hypertension: Secondary | ICD-10-CM | POA: Diagnosis not present

## 2017-08-28 DIAGNOSIS — E559 Vitamin D deficiency, unspecified: Secondary | ICD-10-CM | POA: Diagnosis not present

## 2017-08-28 DIAGNOSIS — R609 Edema, unspecified: Secondary | ICD-10-CM | POA: Diagnosis not present

## 2017-09-16 DIAGNOSIS — G8929 Other chronic pain: Secondary | ICD-10-CM | POA: Diagnosis not present

## 2017-09-16 DIAGNOSIS — M1000 Idiopathic gout, unspecified site: Secondary | ICD-10-CM | POA: Diagnosis not present

## 2017-09-16 DIAGNOSIS — M545 Low back pain: Secondary | ICD-10-CM | POA: Diagnosis not present

## 2017-09-16 DIAGNOSIS — G894 Chronic pain syndrome: Secondary | ICD-10-CM | POA: Diagnosis not present

## 2017-09-18 DIAGNOSIS — Z7984 Long term (current) use of oral hypoglycemic drugs: Secondary | ICD-10-CM | POA: Diagnosis not present

## 2017-09-18 DIAGNOSIS — M2042 Other hammer toe(s) (acquired), left foot: Secondary | ICD-10-CM | POA: Diagnosis not present

## 2017-09-18 DIAGNOSIS — E1140 Type 2 diabetes mellitus with diabetic neuropathy, unspecified: Secondary | ICD-10-CM | POA: Diagnosis not present

## 2017-09-18 DIAGNOSIS — B351 Tinea unguium: Secondary | ICD-10-CM | POA: Diagnosis not present

## 2017-09-19 DIAGNOSIS — E1140 Type 2 diabetes mellitus with diabetic neuropathy, unspecified: Secondary | ICD-10-CM | POA: Diagnosis not present

## 2017-09-19 DIAGNOSIS — Z7984 Long term (current) use of oral hypoglycemic drugs: Secondary | ICD-10-CM | POA: Diagnosis not present

## 2017-09-19 DIAGNOSIS — M109 Gout, unspecified: Secondary | ICD-10-CM | POA: Diagnosis not present

## 2017-09-19 DIAGNOSIS — H25813 Combined forms of age-related cataract, bilateral: Secondary | ICD-10-CM | POA: Diagnosis not present

## 2017-09-19 DIAGNOSIS — E119 Type 2 diabetes mellitus without complications: Secondary | ICD-10-CM | POA: Diagnosis not present

## 2017-09-19 DIAGNOSIS — I5030 Unspecified diastolic (congestive) heart failure: Secondary | ICD-10-CM | POA: Diagnosis not present

## 2017-09-19 DIAGNOSIS — I10 Essential (primary) hypertension: Secondary | ICD-10-CM | POA: Diagnosis not present

## 2017-09-29 ENCOUNTER — Ambulatory Visit (INDEPENDENT_AMBULATORY_CARE_PROVIDER_SITE_OTHER): Payer: Medicare Other | Admitting: Interventional Cardiology

## 2017-09-29 ENCOUNTER — Encounter: Payer: Self-pay | Admitting: Interventional Cardiology

## 2017-09-29 VITALS — BP 120/66 | HR 96 | Ht 59.0 in

## 2017-09-29 DIAGNOSIS — R600 Localized edema: Secondary | ICD-10-CM | POA: Diagnosis not present

## 2017-09-29 DIAGNOSIS — I5032 Chronic diastolic (congestive) heart failure: Secondary | ICD-10-CM

## 2017-09-29 NOTE — Progress Notes (Signed)
Cardiology Office Note   Date:  09/29/2017   ID:  [name], DOB November 02, 1931, MRN [id number]  PCP:  Patient, No Pcp Per    No chief complaint on file. Chronic diastolic heart failure   Wt Readings from Last 3 Encounters:  08/21/17 215 lb (97.5 kg)  12/15/16 215 lb (97.5 kg)  11/24/16 215 lb (97.5 kg)       History of Present Illness: [name] is a 83 y.o. female   With chronic diastolic heart failure and permanent pacemaker placement. She is wheelchair-bound.  She is managed with furosemide and metolazone.   Overall, she has been feeling well.  She reports some nasal drainage, that feels like a sinus infection. She states that benadryl has helped in the past.     Past Medical History:  Diagnosis Date  . Arthritis    "qwhere"  . AV block     2:1  January 14, 2011 / pacemaker plan when cellulitis resolved in length  . Bradycardia    2:1  AV block  . Cellulitis    Superficial cellulitis right lower leg   January 14, 2011  . Chronic diastolic CHF (congestive heart failure) (HCC)   . Diabetes mellitus without complication (HCC)   . Edema    . EF 65-70%, echo, November 29, 2010  . Ejection fraction    EF 65%, echo, January, 2012  . Hypertension   . Hypokalemia    October, 2012  . Murmur    No significant valvular disease by echo, January, 2012  . Pacemaker    January, 2013 Medtronic Sensa  . Pulmonary hypertension (HCC)     46 mmHg... echo... January, 2012    Past Surgical History:  Procedure Laterality Date  . CHOLECYSTECTOMY    . HIP ARTHROPLASTY Right   . INSERT / REPLACE / REMOVE PACEMAKER  11/2011  . PARTIAL HYSTERECTOMY     vaginal     Current Outpatient Medications  Medication Sig Dispense Refill  . acetaminophen-codeine (TYLENOL #4) 300-60 MG tablet Take 1 tablet by mouth every 8 (eight) hours as needed for pain.     [name]. allopurinol (ZYLOPRIM) 100 MG tablet Take 200 mg by mouth daily.    . carvedilol (COREG) 6.25 MG tablet Take  6.25 mg by mouth 2 (two) times daily with a meal.    . cholecalciferol (VITAMIN D) 1000 UNITS tablet Take 1,000 Units by mouth daily.    . diphenhydrAMINE (BENADRYL) 25 mg capsule Take 1 capsule (25 mg total) by mouth every 6 (six) hours as needed for itching. 30 capsule 0  . furosemide (LASIX) 40 MG tablet Take 40 mg daily by mouth.    . furosemide (LASIX) 80 MG tablet Take 80 mg 2 (two) times daily by mouth.    . gabapentin (NEURONTIN) 300 MG capsule Take 1 capsule (300 mg total) by mouth 3 (three) times daily. 60 capsule 0  . HYDROcodone-acetaminophen (NORCO) 10-325 MG tablet Take 1 tablet daily as needed by mouth.    [name]. JANUVIA 25 MG tablet Take 25 mg daily by mouth.    . loratadine (CLARITIN) 10 MG tablet Take 10 mg by mouth daily.    . magnesium oxide (MAG-OX) 400 (241.3 MG) MG tablet TAKE ONE TABLET BY MOUTH ONE TIME DAILY 30 tablet 4  . Menthol, Topical Analgesic, (BIOFREEZE COLORLESS EX) Apply 1 application topically 2 (two) times daily.    . metFORMIN (GLUCOPHAGE) 850 MG tablet Take 850 mg 2 (two) times  daily by mouth.    . metolazone (ZAROXOLYN) 2.5 MG tablet Take 2.5 mg by mouth once a week. TAKE EVERY OTHER DAY 30 MINUTES BEFORE LASIX    . Multiple Vitamins-Minerals (CERTA-VITE PO) Take 1 tablet by mouth daily.    . nitroGLYCERIN (NITROSTAT) 0.4 MG SL tablet Place 0.4 mg under the tongue every 5 (five) minutes as needed for chest pain (X 3 DOSES FOR CHEST PAIN).    [name] polyethylene glycol powder (GLYCOLAX/MIRALAX) powder Take 17 g by mouth daily.    . potassium chloride SA (K-DUR,KLOR-CON) 20 MEQ tablet Take 40 mEq by mouth daily.      No current facility-administered medications for this visit.     Allergies:   Aspirin; Tramadol; and Naproxen    Social History:  The patient  reports that  has never smoked. she has never used smokeless tobacco. She reports that she does not drink alcohol or use drugs.   Family History:  The patient's family history includes Cancer in her son;  Cardiomyopathy in her father; Coronary artery disease in her son; Dementia in her mother; Diabetes in her father; Heart failure in her father.    ROS:  Please see the history of present illness.   Otherwise, review of systems are positive for nasal drainage.   All other systems are reviewed and negative.    PHYSICAL EXAM: VS:  BP 120/66   Pulse 96   Ht 4\' 11"  (1.499 m)   SpO2 92%   BMI 43.42 kg/m  , BMI Body mass index is 43.42 kg/m. GEN: Well nourished, well developed, in no acute distress  HEENT: normal  Neck: no JVD, carotid bruits, or masses Cardiac: RRR; no murmurs, rubs, or gallops,tr pretibial edema  Respiratory:  clear to auscultation bilaterally, normal work of breathing GI: soft, nontender, nondistended, + BS, obese MS: no deformity or atrophy  Skin: warm and dry, no rash Neuro:  Strength and sensation are intact Psych: euthymic mood, full affect     Recent Labs: 11/24/2016: NT-Pro BNP 616; Platelets WILL FOLLOW; Platelets 312 08/21/2017: BUN 20; Creatinine, Ser 0.70; Hemoglobin 14.6; Potassium 4.0; Sodium 136   Lipid Panel No results found for: CHOL, TRIG, HDL, CHOLHDL, VLDL, LDLCALC, LDLDIRECT   Other studies Reviewed: Additional studies/ records that were reviewed today with results demonstrating: Cr in 9/18 was 0.87.   ASSESSMENT AND PLAN:  1. Chronic diastolic heart failure: Continue current dose of diuretics.  If she feels more fluid retention, could increase dose for a few days. Renal function stable at most recent check.  2. HTN: BP controlled today.  3. Obesity: It will be difficult for her to lose weight.   Minimize salt intake.    Current medicines are reviewed at length with the patient today.  The patient concerns regarding her medicines were addressed.  The following changes have been made:  No change  Labs/ tests ordered today include:  No orders of the defined types were placed in this encounter.   Recommend 150 minutes/week of aerobic  exercise Low fat, low carb, high fiber diet recommended  Disposition:   FU in 6 months   Signed, Lance Muss, MD  09/29/2017 3:33 PM    [facility] [address] [place], [place], [place]  [address] Phone: [phone]; Fax: [phone]

## 2017-09-29 NOTE — Patient Instructions (Addendum)
Medication Instructions:  Your physician recommends that you continue on your current medications as directed. Please refer to the Current Medication list given to you today.  You may take your benadryl as needed for allergies.  Labwork: None ordered  Testing/Procedures: None ordered  Follow-Up: Your physician wants you to follow-up in: 6 months with Dr. Eldridge Dace. You will receive a reminder letter in the mail two months in advance. If you don't receive a letter, please call our office to schedule the follow-up appointment.   Any Other Special Instructions Will Be Listed Below (If Applicable).     If you need a refill on your cardiac medications before your next appointment, please call your pharmacy.

## 2017-10-06 DIAGNOSIS — Z79899 Other long term (current) drug therapy: Secondary | ICD-10-CM | POA: Diagnosis not present

## 2017-10-10 ENCOUNTER — Ambulatory Visit: Payer: Medicare Other | Admitting: Interventional Cardiology

## 2017-10-22 DIAGNOSIS — G894 Chronic pain syndrome: Secondary | ICD-10-CM | POA: Diagnosis not present

## 2017-10-22 DIAGNOSIS — M1000 Idiopathic gout, unspecified site: Secondary | ICD-10-CM | POA: Diagnosis not present

## 2017-10-22 DIAGNOSIS — G8929 Other chronic pain: Secondary | ICD-10-CM | POA: Diagnosis not present

## 2017-10-22 DIAGNOSIS — M545 Low back pain: Secondary | ICD-10-CM | POA: Diagnosis not present

## 2017-10-24 DIAGNOSIS — R609 Edema, unspecified: Secondary | ICD-10-CM | POA: Diagnosis not present

## 2017-10-24 DIAGNOSIS — J069 Acute upper respiratory infection, unspecified: Secondary | ICD-10-CM | POA: Diagnosis not present

## 2017-10-24 DIAGNOSIS — I5030 Unspecified diastolic (congestive) heart failure: Secondary | ICD-10-CM | POA: Diagnosis not present

## 2017-10-26 ENCOUNTER — Ambulatory Visit (INDEPENDENT_AMBULATORY_CARE_PROVIDER_SITE_OTHER): Payer: Medicare Other | Admitting: *Deleted

## 2017-10-26 DIAGNOSIS — I4430 Unspecified atrioventricular block: Secondary | ICD-10-CM

## 2017-10-26 LAB — CUP PACEART INCLINIC DEVICE CHECK
Battery Remaining Longevity: 43 mo
Brady Statistic AS VS Percent: 0 %
Implantable Lead Implant Date: 20130118
Implantable Lead Model: 5076
Lead Channel Impedance Value: 419 Ohm
Lead Channel Impedance Value: 505 Ohm
Lead Channel Pacing Threshold Amplitude: 0.75 V
Lead Channel Pacing Threshold Amplitude: 1 V
Lead Channel Sensing Intrinsic Amplitude: 4 mV
Lead Channel Setting Pacing Amplitude: 2.5 V
Lead Channel Setting Sensing Sensitivity: 4 mV
MDC IDC LEAD IMPLANT DT: 20130118
MDC IDC LEAD LOCATION: 753859
MDC IDC LEAD LOCATION: 753860
MDC IDC MSMT BATTERY IMPEDANCE: 1251 Ohm
MDC IDC MSMT BATTERY VOLTAGE: 2.77 V
MDC IDC MSMT LEADCHNL RA PACING THRESHOLD PULSEWIDTH: 0.4 ms
MDC IDC MSMT LEADCHNL RV PACING THRESHOLD PULSEWIDTH: 0.4 ms
MDC IDC PG IMPLANT DT: 20130118
MDC IDC SESS DTM: 20181206145555
MDC IDC SET LEADCHNL RA PACING AMPLITUDE: 2 V
MDC IDC SET LEADCHNL RV PACING PULSEWIDTH: 0.4 ms
MDC IDC STAT BRADY AP VP PERCENT: 1 %
MDC IDC STAT BRADY AP VS PERCENT: 0 %
MDC IDC STAT BRADY AS VP PERCENT: 99 %

## 2017-10-26 NOTE — Progress Notes (Signed)
Pacemaker check in clinic. Normal device function. Thresholds, sensing, impedances consistent with previous measurements. Device programmed to maximize longevity. No mode switch. 4 high ventricular rates noted, longest 11 seconds, Max V rate 202 bpm. Device programmed at appropriate safety margins. Histogram distribution appropriate for patient activity level. Device programmed to optimize intrinsic conduction. Estimated longevity 3.5 years. Patient education completed. ROV with GT 03/2018.

## 2017-11-16 DIAGNOSIS — I10 Essential (primary) hypertension: Secondary | ICD-10-CM | POA: Diagnosis not present

## 2017-11-16 DIAGNOSIS — G8929 Other chronic pain: Secondary | ICD-10-CM | POA: Diagnosis not present

## 2017-11-16 DIAGNOSIS — E559 Vitamin D deficiency, unspecified: Secondary | ICD-10-CM | POA: Diagnosis not present

## 2017-11-16 DIAGNOSIS — R609 Edema, unspecified: Secondary | ICD-10-CM | POA: Diagnosis not present

## 2017-11-21 DIAGNOSIS — R609 Edema, unspecified: Secondary | ICD-10-CM | POA: Diagnosis not present

## 2017-11-21 DIAGNOSIS — I10 Essential (primary) hypertension: Secondary | ICD-10-CM | POA: Diagnosis not present

## 2017-11-21 DIAGNOSIS — E119 Type 2 diabetes mellitus without complications: Secondary | ICD-10-CM | POA: Diagnosis not present

## 2017-11-21 DIAGNOSIS — I5030 Unspecified diastolic (congestive) heart failure: Secondary | ICD-10-CM | POA: Diagnosis not present

## 2017-11-25 DIAGNOSIS — G894 Chronic pain syndrome: Secondary | ICD-10-CM | POA: Diagnosis not present

## 2017-11-25 DIAGNOSIS — M545 Low back pain: Secondary | ICD-10-CM | POA: Diagnosis not present

## 2017-11-25 DIAGNOSIS — G8929 Other chronic pain: Secondary | ICD-10-CM | POA: Diagnosis not present

## 2017-11-25 DIAGNOSIS — M1000 Idiopathic gout, unspecified site: Secondary | ICD-10-CM | POA: Diagnosis not present

## 2017-11-28 DIAGNOSIS — B351 Tinea unguium: Secondary | ICD-10-CM | POA: Diagnosis not present

## 2017-11-28 DIAGNOSIS — E1140 Type 2 diabetes mellitus with diabetic neuropathy, unspecified: Secondary | ICD-10-CM | POA: Diagnosis not present

## 2017-11-28 DIAGNOSIS — E1151 Type 2 diabetes mellitus with diabetic peripheral angiopathy without gangrene: Secondary | ICD-10-CM | POA: Diagnosis not present

## 2017-12-05 DIAGNOSIS — Z79899 Other long term (current) drug therapy: Secondary | ICD-10-CM | POA: Diagnosis not present

## 2017-12-25 DIAGNOSIS — R609 Edema, unspecified: Secondary | ICD-10-CM | POA: Diagnosis not present

## 2017-12-25 DIAGNOSIS — G8929 Other chronic pain: Secondary | ICD-10-CM | POA: Diagnosis not present

## 2017-12-25 DIAGNOSIS — E119 Type 2 diabetes mellitus without complications: Secondary | ICD-10-CM | POA: Diagnosis not present

## 2017-12-25 DIAGNOSIS — I10 Essential (primary) hypertension: Secondary | ICD-10-CM | POA: Diagnosis not present

## 2018-01-16 DIAGNOSIS — F419 Anxiety disorder, unspecified: Secondary | ICD-10-CM | POA: Diagnosis not present

## 2018-01-16 DIAGNOSIS — E559 Vitamin D deficiency, unspecified: Secondary | ICD-10-CM | POA: Diagnosis not present

## 2018-01-16 DIAGNOSIS — I5030 Unspecified diastolic (congestive) heart failure: Secondary | ICD-10-CM | POA: Diagnosis not present

## 2018-01-16 DIAGNOSIS — I10 Essential (primary) hypertension: Secondary | ICD-10-CM | POA: Diagnosis not present

## 2018-02-02 DIAGNOSIS — E119 Type 2 diabetes mellitus without complications: Secondary | ICD-10-CM | POA: Diagnosis not present

## 2018-02-02 DIAGNOSIS — R609 Edema, unspecified: Secondary | ICD-10-CM | POA: Diagnosis not present

## 2018-02-02 DIAGNOSIS — G8929 Other chronic pain: Secondary | ICD-10-CM | POA: Diagnosis not present

## 2018-02-02 DIAGNOSIS — M109 Gout, unspecified: Secondary | ICD-10-CM | POA: Diagnosis not present

## 2018-02-02 DIAGNOSIS — I10 Essential (primary) hypertension: Secondary | ICD-10-CM | POA: Diagnosis not present

## 2018-02-16 DIAGNOSIS — M109 Gout, unspecified: Secondary | ICD-10-CM | POA: Diagnosis not present

## 2018-03-02 DIAGNOSIS — E1151 Type 2 diabetes mellitus with diabetic peripheral angiopathy without gangrene: Secondary | ICD-10-CM | POA: Diagnosis not present

## 2018-03-02 DIAGNOSIS — L603 Nail dystrophy: Secondary | ICD-10-CM | POA: Diagnosis not present

## 2018-03-02 DIAGNOSIS — R262 Difficulty in walking, not elsewhere classified: Secondary | ICD-10-CM | POA: Diagnosis not present

## 2018-03-15 DIAGNOSIS — R609 Edema, unspecified: Secondary | ICD-10-CM | POA: Diagnosis not present

## 2018-03-15 DIAGNOSIS — E559 Vitamin D deficiency, unspecified: Secondary | ICD-10-CM | POA: Diagnosis not present

## 2018-03-15 DIAGNOSIS — F419 Anxiety disorder, unspecified: Secondary | ICD-10-CM | POA: Diagnosis not present

## 2018-03-15 DIAGNOSIS — I10 Essential (primary) hypertension: Secondary | ICD-10-CM | POA: Diagnosis not present

## 2018-04-03 DIAGNOSIS — G8929 Other chronic pain: Secondary | ICD-10-CM | POA: Diagnosis not present

## 2018-04-03 DIAGNOSIS — I5030 Unspecified diastolic (congestive) heart failure: Secondary | ICD-10-CM | POA: Diagnosis not present

## 2018-04-03 DIAGNOSIS — R609 Edema, unspecified: Secondary | ICD-10-CM | POA: Diagnosis not present

## 2018-04-03 DIAGNOSIS — E119 Type 2 diabetes mellitus without complications: Secondary | ICD-10-CM | POA: Diagnosis not present

## 2018-04-03 DIAGNOSIS — I10 Essential (primary) hypertension: Secondary | ICD-10-CM | POA: Diagnosis not present

## 2018-04-11 NOTE — Progress Notes (Signed)
Cardiology Office Note   Date:  04/12/2018   ID:  [name], DOB 1930-11-22, MRN [id number]  PCP:  Patient, No Pcp Per    No chief complaint on file.  Chronic diastolic heart failure  Wt Readings from Last 3 Encounters:  08/21/17 215 lb (97.5 kg)  12/15/16 215 lb (97.5 kg)  11/24/16 215 lb (97.5 kg)       History of Present Illness: [name] is a 84 y.o. female  With chronic diastolic heart failure and permanent pacemaker placement. She is wheelchair-bound.  She is managed with furosemide and metolazone.    Denies : Chest pain. Dizziness. Leg edema. Nitroglycerin use. Orthopnea. Palpitations. Paroxysmal nocturnal dyspnea. Shortness of breath. Syncope.    Weighed occasional.  Due to mobility issues, she is not weighed daily.    Past Medical History:  Diagnosis Date  . Arthritis    "qwhere"  . AV block     2:1  January 14, 2011 / pacemaker plan when cellulitis resolved in length  . Bradycardia    2:1  AV block  . Cellulitis    Superficial cellulitis right lower leg   January 14, 2011  . Chronic diastolic CHF (congestive heart failure) (HCC)   . Diabetes mellitus without complication (HCC)   . Edema    . EF 65-70%, echo, November 29, 2010  . Ejection fraction    EF 65%, echo, January, 2012  . Hypertension   . Hypokalemia    October, 2012  . Murmur    No significant valvular disease by echo, January, 2012  . Pacemaker    January, 2013 Medtronic Sensa  . Pulmonary hypertension (HCC)     46 mmHg... echo... January, 2012    Past Surgical History:  Procedure Laterality Date  . CHOLECYSTECTOMY    . HIP ARTHROPLASTY Right   . INSERT / REPLACE / REMOVE PACEMAKER  11/2011  . PARTIAL HYSTERECTOMY     vaginal  . PERMANENT PACEMAKER INSERTION N/A 12/09/2011   Procedure: PERMANENT PACEMAKER INSERTION;  Surgeon: Marinus Maw, MD;  Location: [facility] CATH LAB;  Service: Cardiovascular;  Laterality: N/A;     Current Outpatient Medications  Medication  Sig Dispense Refill  . allopurinol (ZYLOPRIM) 100 MG tablet Take 200 mg by mouth daily.    . carvedilol (COREG) 6.25 MG tablet Take 6.25 mg by mouth 2 (two) times daily with a meal.    . cholecalciferol (VITAMIN D) 1000 UNITS tablet Take 1,000 Units by mouth daily.    . diphenhydrAMINE (BENADRYL) 25 mg capsule Take 1 capsule (25 mg total) by mouth every 6 (six) hours as needed for itching. 30 capsule 0  . furosemide (LASIX) 40 MG tablet Take 40 mg daily by mouth.    . gabapentin (NEURONTIN) 300 MG capsule Take 1 capsule (300 mg total) by mouth 3 (three) times daily. 60 capsule 0  . HYDROcodone-acetaminophen (NORCO) 10-325 MG tablet Take 1 tablet daily as needed by mouth.    [name] JANUVIA 25 MG tablet Take 25 mg daily by mouth.    . loratadine (CLARITIN) 10 MG tablet Take 10 mg by mouth daily.    . magnesium oxide (MAG-OX) 400 (241.3 MG) MG tablet TAKE ONE TABLET BY MOUTH ONE TIME DAILY 30 tablet 4  . Menthol, Topical Analgesic, (BIOFREEZE COLORLESS EX) Apply 1 application topically 2 (two) times daily.    . metFORMIN (GLUCOPHAGE) 850 MG tablet Take 500 mg by mouth 2 (two) times daily.     [name]  metolazone (ZAROXOLYN) 2.5 MG tablet Take 2.5 mg by mouth once a week. TAKE EVERY OTHER DAY 30 MINUTES BEFORE LASIX    . Multiple Vitamins-Minerals (CERTA-VITE PO) Take 1 tablet by mouth daily.    . nitroGLYCERIN (NITROSTAT) 0.4 MG SL tablet Place 0.4 mg under the tongue every 5 (five) minutes as needed for chest pain (X 3 DOSES FOR CHEST PAIN).    [name] polyethylene glycol powder (GLYCOLAX/MIRALAX) powder Take 17 g by mouth daily.    . potassium chloride SA (K-DUR,KLOR-CON) 20 MEQ tablet Take 40 mEq by mouth daily.      No current facility-administered medications for this visit.     Allergies:   Aspirin; Tramadol; and Naproxen    Social History:  The patient  reports that she has never smoked. She has never used smokeless tobacco. She reports that she does not drink alcohol or use drugs.   Family History:   The patient's family history includes Cancer in her son; Cardiomyopathy in her father; Coronary artery disease in her son; Dementia in her mother; Diabetes in her father; Heart failure in her father.    ROS:  Please see the history of present illness.   Otherwise, review of systems are positive for allergies.   All other systems are reviewed and negative.    PHYSICAL EXAM: VS:  BP 112/66   Pulse 90   Ht  (1.499 m)   SpO2 91%   BMI 43.42 kg/m  , BMI Body mass index is 43.42 kg/m. GEN: Well nourished, well developed, in no acute distress  HEENT: normal  Neck: no JVD, carotid bruits, or masses Cardiac: RRR; no murmurs, rubs, or gallops,no edema  Respiratory:  clear to auscultation bilaterally, normal work of breathing GI: soft, nontender, nondistended, + BS, obese MS: no deformity or atrophy  Skin: warm and dry, no rash Neuro:  Wheelchair bound Psych: euthymic mood, full affect   EKG:   The ekg ordered today demonstrates ventricular paced rhythm   Recent Labs: 08/21/2017: BUN 20; Creatinine, Ser 0.70; Hemoglobin 14.6; Potassium 4.0; Sodium 136   Lipid Panel No results found for: CHOL, TRIG, HDL, CHOLHDL, VLDL, LDLCALC, LDLDIRECT   Other studies Reviewed: Additional studies/ records that were reviewed today with results demonstrating: labs pending.   ASSESSMENT AND PLAN:  1. Chronic diastolic heart failure: Continue diuretics.  Low salt diet and daily weights would also be helpful.  2. HTN: The current medical regimen is effective;  continue present plan and medications. 3. Obesity: Difficult to lose weight due to lack of activity. 4. Pacer: followed by EP. Followed by Dr. Ladona Ridgel.   Current medicines are reviewed at length with the patient today.  The patient concerns regarding her medicines were addressed.  The following changes have been made:  No change  Labs/ tests ordered today include:  No orders of the defined types were placed in this  encounter.   Recommend 150 minutes/week of aerobic exercise Low fat, low carb, high fiber diet recommended  Disposition:   FU in 6 months   Signed, Lance Muss, MD  04/12/2018 2:44 PM    [facility] [address] [place], [place], [place]  [address] Phone: [phone]; Fax: [phone]

## 2018-04-12 ENCOUNTER — Ambulatory Visit (INDEPENDENT_AMBULATORY_CARE_PROVIDER_SITE_OTHER): Payer: Medicare Other | Admitting: Interventional Cardiology

## 2018-04-12 ENCOUNTER — Encounter: Payer: Self-pay | Admitting: Internal Medicine

## 2018-04-12 ENCOUNTER — Ambulatory Visit (INDEPENDENT_AMBULATORY_CARE_PROVIDER_SITE_OTHER): Payer: Medicare Other | Admitting: Internal Medicine

## 2018-04-12 ENCOUNTER — Encounter: Payer: Self-pay | Admitting: Interventional Cardiology

## 2018-04-12 VITALS — BP 112/66 | HR 89 | Ht 59.0 in

## 2018-04-12 VITALS — BP 112/66 | HR 90 | Ht 59.0 in

## 2018-04-12 DIAGNOSIS — I10 Essential (primary) hypertension: Secondary | ICD-10-CM

## 2018-04-12 DIAGNOSIS — Z950 Presence of cardiac pacemaker: Secondary | ICD-10-CM | POA: Diagnosis not present

## 2018-04-12 DIAGNOSIS — I5032 Chronic diastolic (congestive) heart failure: Secondary | ICD-10-CM

## 2018-04-12 DIAGNOSIS — I4430 Unspecified atrioventricular block: Secondary | ICD-10-CM | POA: Diagnosis not present

## 2018-04-12 NOTE — Progress Notes (Signed)
HPI [name] returns today for followup. She is a pleasant 83 yo woman, with a h/o CHB, s/p PPM chronic venous stasis, now wheelchair bound who present for ongoing evaluation of her PPM and CHB. She denies chest pain and her dyspnea is now non-limiting as she is wheelchair bound. Allergies  Allergen Reactions  . Aspirin Rash and Other (See Comments)    GI distress  . Tramadol Other (See Comments)    GI distress  . Naproxen      Current Outpatient Medications  Medication Sig Dispense Refill  . allopurinol (ZYLOPRIM) 100 MG tablet Take 200 mg by mouth daily.    . carvedilol (COREG) 6.25 MG tablet Take 6.25 mg by mouth 2 (two) times daily with a meal.    . cholecalciferol (VITAMIN D) 1000 UNITS tablet Take 1,000 Units by mouth daily.    . diphenhydrAMINE (BENADRYL) 25 mg capsule Take 1 capsule (25 mg total) by mouth every 6 (six) hours as needed for itching. 30 capsule 0  . furosemide (LASIX) 40 MG tablet Take 40 mg daily by mouth.    . gabapentin (NEURONTIN) 300 MG capsule Take 1 capsule (300 mg total) by mouth 3 (three) times daily. 60 capsule 0  . HYDROcodone-acetaminophen (NORCO) 10-325 MG tablet Take 1 tablet daily as needed by mouth.    [name] JANUVIA 25 MG tablet Take 25 mg daily by mouth.    . loratadine (CLARITIN) 10 MG tablet Take 10 mg by mouth daily.    . magnesium oxide (MAG-OX) 400 (241.3 MG) MG tablet TAKE ONE TABLET BY MOUTH ONE TIME DAILY 30 tablet 4  . Menthol, Topical Analgesic, (BIOFREEZE COLORLESS EX) Apply 1 application topically 2 (two) times daily.    . metFORMIN (GLUCOPHAGE) 850 MG tablet Take 500 mg by mouth 2 (two) times daily.     . metolazone (ZAROXOLYN) 2.5 MG tablet Take 2.5 mg by mouth once a week. TAKE EVERY OTHER DAY 30 MINUTES BEFORE LASIX    . Multiple Vitamins-Minerals (CERTA-VITE PO) Take 1 tablet by mouth daily.    . nitroGLYCERIN (NITROSTAT) 0.4 MG SL tablet Place 0.4 mg under the tongue every 5 (five) minutes as needed for chest pain (X 3 DOSES  FOR CHEST PAIN).    [name] polyethylene glycol powder (GLYCOLAX/MIRALAX) powder Take 17 g by mouth daily.    . potassium chloride SA (K-DUR,KLOR-CON) 20 MEQ tablet Take 40 mEq by mouth daily.      No current facility-administered medications for this visit.      Past Medical History:  Diagnosis Date  . Arthritis    "qwhere"  . AV block     2:1  January 14, 2011 / pacemaker plan when cellulitis resolved in length  . Bradycardia    2:1  AV block  . Cellulitis    Superficial cellulitis right lower leg   January 14, 2011  . Chronic diastolic CHF (congestive heart failure) (HCC)   . Diabetes mellitus without complication (HCC)   . Edema    . EF 65-70%, echo, November 29, 2010  . Ejection fraction    EF 65%, echo, January, 2012  . Hypertension   . Hypokalemia    October, 2012  . Murmur    No significant valvular disease by echo, January, 2012  . Pacemaker    January, 2013 Medtronic Sensa  . Pulmonary hypertension (HCC)     46 mmHg... echo... January, 2012    ROS:   All systems reviewed and negative  except as noted in the HPI.   Past Surgical History:  Procedure Laterality Date  . CHOLECYSTECTOMY    . HIP ARTHROPLASTY Right   . INSERT / REPLACE / REMOVE PACEMAKER  11/2011  . PARTIAL HYSTERECTOMY     vaginal  . PERMANENT PACEMAKER INSERTION N/A 12/09/2011   Procedure: PERMANENT PACEMAKER INSERTION;  Surgeon: Marinus Maw, MD;  Location: [facility] CATH LAB;  Service: Cardiovascular;  Laterality: N/A;     Family History  Problem Relation Age of Onset  . Cardiomyopathy Father   . Diabetes Father   . Heart failure Father   . Dementia Mother   . Cancer Son   . Coronary artery disease Son   . Heart attack Neg Hx      Social History   Socioeconomic History  . Marital status: Widowed    Spouse name: Not on file  . Number of children: Not on file  . Years of education: Not on file  . Highest education level: Not on file  Occupational History  . Occupation: Retired     Associate Professor: RETIRED  Social Needs  . Financial resource strain: Not on file  . Food insecurity:    Worry: Not on file    Inability: Not on file  . Transportation needs:    Medical: Not on file    Non-medical: Not on file  Tobacco Use  . Smoking status: Never Smoker  . Smokeless tobacco: Never Used  Substance and Sexual Activity  . Alcohol use: No  . Drug use: No  . Sexual activity: Never  Lifestyle  . Physical activity:    Days per week: Not on file    Minutes per session: Not on file  . Stress: Not on file  Relationships  . Social connections:    Talks on phone: Not on file    Gets together: Not on file    Attends religious service: Not on file    Active member of club or organization: Not on file    Attends meetings of clubs or organizations: Not on file    Relationship status: Not on file  . Intimate partner violence:    Fear of current or ex partner: Not on file    Emotionally abused: Not on file    Physically abused: Not on file    Forced sexual activity: Not on file  Other Topics Concern  . Not on file  Social History Narrative   Retired    Widowed    Tobacco Use - No.    Alcohol Use - no   Regular Exercise - no   Drug Use - no     BP 112/66   Pulse 89   Ht 4\' 11"  (1.499 m)   SpO2 91%   BMI 43.42 kg/m   Physical Exam:  Chronically ill appearing elderly woman, NAD HEENT: Unremarkable Neck:  No JVD, no thyromegally Lymphatics:  No adenopathy Back:  No CVA tenderness Lungs:  Clear HEART:  Regular rate rhythm, no murmurs, no rubs, no clicks Abd:  soft, positive bowel sounds, no organomegally, no rebound, no guarding Ext:  2 plus pulses, no edema, no cyanosis, no clubbing Skin:  No rashes no nodules Neuro:  CN II through XII intact, motor grossly intact  EKG - NSR with p synch ventricular pacing  DEVICE  Normal device function.  See PaceArt for details.   Assess/Plan: 1. CHB -she is asymptomatic, s/p PPM insertion. 2. PPM - her Medtronic DDD PM  is working normally  with app 3 years of battery longevity. 3. HTN - her blood pressure is well controlled. She will continue her current meds. 4. Chronic diastolic heart failure - her lasix dose is stable and she will maintain a low sodium diet.  Leonia Reeves.D.

## 2018-04-12 NOTE — Patient Instructions (Signed)
Medication Instructions:  Your physician recommends that you continue on your current medications as directed. Please refer to the Current Medication list given to you today.   Labwork: TODAY: CMET  Testing/Procedures: None ordered  Follow-Up: Your physician wants you to follow-up in: 6 months with Dr. Eldridge Dace. You will receive a reminder letter in the mail two months in advance. If you don't receive a letter, please call our office to schedule the follow-up appointment.   Any Other Special Instructions Will Be Listed Below (If Applicable).     If you need a refill on your cardiac medications before your next appointment, please call your pharmacy.

## 2018-04-12 NOTE — Patient Instructions (Signed)
Medication Instructions:  Your physician recommends that you continue on your current medications as directed. Please refer to the Current Medication list given to you today.  Labwork: None ordered.  Testing/Procedures: None ordered.  Follow-Up:  Your physician wants you to follow-up in: 6 months with device clinic for a device check.  You will receive a reminder letter in the mail two months in advance. If you don't receive a letter, please call our office to schedule the follow-up appointment.  Your physician wants you to follow-up in: one year with Dr. Taylor.   You will receive a reminder letter in the mail two months in advance. If you don't receive a letter, please call our office to schedule the follow-up appointment.   Any Other Special Instructions Will Be Listed Below (If Applicable).  If you need a refill on your cardiac medications before your next appointment, please call your pharmacy.   

## 2018-04-13 LAB — CUP PACEART INCLINIC DEVICE CHECK
Implantable Lead Implant Date: 20130118
Implantable Lead Location: 753859
Implantable Lead Location: 753860
Implantable Lead Model: 5076
Implantable Pulse Generator Implant Date: 20130118
MDC IDC LEAD IMPLANT DT: 20130118
MDC IDC SESS DTM: 20190524092321

## 2018-04-13 LAB — COMPREHENSIVE METABOLIC PANEL
A/G RATIO: 1.3 (ref 1.2–2.2)
ALT: 9 IU/L (ref 0–32)
AST: 24 IU/L (ref 0–40)
Albumin: 4.1 g/dL (ref 3.5–4.7)
Alkaline Phosphatase: 101 IU/L (ref 39–117)
BUN/Creatinine Ratio: 24 (ref 12–28)
BUN: 19 mg/dL (ref 8–27)
Bilirubin Total: 0.2 mg/dL (ref 0.0–1.2)
CO2: 30 mmol/L — AB (ref 20–29)
Calcium: 10 mg/dL (ref 8.7–10.3)
Chloride: 86 mmol/L — ABNORMAL LOW (ref 96–106)
Creatinine, Ser: 0.78 mg/dL (ref 0.57–1.00)
GFR calc Af Amer: 81 mL/min/{1.73_m2} (ref >59)
GFR calc non Af Amer: 70 mL/min/{1.73_m2} (ref >59)
Globulin, Total: 3.2 g/dL (ref 1.5–4.5)
Glucose: 105 mg/dL — ABNORMAL HIGH (ref 65–99)
POTASSIUM: 3.4 mmol/L — AB (ref 3.5–5.2)
Sodium: 134 mmol/L (ref 134–144)
Total Protein: 7.3 g/dL (ref 6.0–8.5)

## 2018-04-17 DIAGNOSIS — E119 Type 2 diabetes mellitus without complications: Secondary | ICD-10-CM | POA: Diagnosis not present

## 2018-04-17 DIAGNOSIS — Z79899 Other long term (current) drug therapy: Secondary | ICD-10-CM | POA: Diagnosis not present

## 2018-05-01 DIAGNOSIS — F419 Anxiety disorder, unspecified: Secondary | ICD-10-CM | POA: Diagnosis not present

## 2018-05-01 DIAGNOSIS — R609 Edema, unspecified: Secondary | ICD-10-CM | POA: Diagnosis not present

## 2018-05-01 DIAGNOSIS — I10 Essential (primary) hypertension: Secondary | ICD-10-CM | POA: Diagnosis not present

## 2018-05-01 DIAGNOSIS — E559 Vitamin D deficiency, unspecified: Secondary | ICD-10-CM | POA: Diagnosis not present

## 2018-05-03 DIAGNOSIS — Z79899 Other long term (current) drug therapy: Secondary | ICD-10-CM | POA: Diagnosis not present

## 2018-05-11 DIAGNOSIS — E1151 Type 2 diabetes mellitus with diabetic peripheral angiopathy without gangrene: Secondary | ICD-10-CM | POA: Diagnosis not present

## 2018-05-11 DIAGNOSIS — R262 Difficulty in walking, not elsewhere classified: Secondary | ICD-10-CM | POA: Diagnosis not present

## 2018-05-11 DIAGNOSIS — B351 Tinea unguium: Secondary | ICD-10-CM | POA: Diagnosis not present

## 2018-05-29 DIAGNOSIS — I5030 Unspecified diastolic (congestive) heart failure: Secondary | ICD-10-CM | POA: Diagnosis not present

## 2018-05-29 DIAGNOSIS — I10 Essential (primary) hypertension: Secondary | ICD-10-CM | POA: Diagnosis not present

## 2018-05-29 DIAGNOSIS — E119 Type 2 diabetes mellitus without complications: Secondary | ICD-10-CM | POA: Diagnosis not present

## 2018-05-29 DIAGNOSIS — R069 Unspecified abnormalities of breathing: Secondary | ICD-10-CM | POA: Diagnosis not present

## 2018-05-30 DIAGNOSIS — R609 Edema, unspecified: Secondary | ICD-10-CM | POA: Diagnosis not present

## 2018-05-30 DIAGNOSIS — I10 Essential (primary) hypertension: Secondary | ICD-10-CM | POA: Diagnosis not present

## 2018-05-30 DIAGNOSIS — I5030 Unspecified diastolic (congestive) heart failure: Secondary | ICD-10-CM | POA: Diagnosis not present

## 2018-05-30 DIAGNOSIS — E119 Type 2 diabetes mellitus without complications: Secondary | ICD-10-CM | POA: Diagnosis not present

## 2018-06-07 DIAGNOSIS — Z79899 Other long term (current) drug therapy: Secondary | ICD-10-CM | POA: Diagnosis not present

## 2018-07-05 DIAGNOSIS — E876 Hypokalemia: Secondary | ICD-10-CM | POA: Diagnosis not present

## 2018-07-05 DIAGNOSIS — T7849XD Other allergy, subsequent encounter: Secondary | ICD-10-CM | POA: Diagnosis not present

## 2018-07-05 DIAGNOSIS — M109 Gout, unspecified: Secondary | ICD-10-CM | POA: Diagnosis not present

## 2018-07-25 DIAGNOSIS — E119 Type 2 diabetes mellitus without complications: Secondary | ICD-10-CM | POA: Diagnosis not present

## 2018-07-25 DIAGNOSIS — I10 Essential (primary) hypertension: Secondary | ICD-10-CM | POA: Diagnosis not present

## 2018-07-25 DIAGNOSIS — I5030 Unspecified diastolic (congestive) heart failure: Secondary | ICD-10-CM | POA: Diagnosis not present

## 2018-07-25 DIAGNOSIS — K5900 Constipation, unspecified: Secondary | ICD-10-CM | POA: Diagnosis not present

## 2018-07-25 DIAGNOSIS — G894 Chronic pain syndrome: Secondary | ICD-10-CM | POA: Diagnosis not present

## 2018-07-25 DIAGNOSIS — J069 Acute upper respiratory infection, unspecified: Secondary | ICD-10-CM | POA: Diagnosis not present

## 2018-07-31 DIAGNOSIS — K5900 Constipation, unspecified: Secondary | ICD-10-CM | POA: Diagnosis not present

## 2018-07-31 DIAGNOSIS — I5030 Unspecified diastolic (congestive) heart failure: Secondary | ICD-10-CM | POA: Diagnosis not present

## 2018-07-31 DIAGNOSIS — J069 Acute upper respiratory infection, unspecified: Secondary | ICD-10-CM | POA: Diagnosis not present

## 2018-07-31 DIAGNOSIS — M1990 Unspecified osteoarthritis, unspecified site: Secondary | ICD-10-CM | POA: Diagnosis not present

## 2018-08-08 DIAGNOSIS — I739 Peripheral vascular disease, unspecified: Secondary | ICD-10-CM | POA: Diagnosis not present

## 2018-08-08 DIAGNOSIS — B351 Tinea unguium: Secondary | ICD-10-CM | POA: Diagnosis not present

## 2018-09-16 DIAGNOSIS — I5030 Unspecified diastolic (congestive) heart failure: Secondary | ICD-10-CM | POA: Diagnosis not present

## 2018-09-16 DIAGNOSIS — R600 Localized edema: Secondary | ICD-10-CM | POA: Diagnosis not present

## 2018-09-16 DIAGNOSIS — M1990 Unspecified osteoarthritis, unspecified site: Secondary | ICD-10-CM | POA: Diagnosis not present

## 2018-09-16 DIAGNOSIS — I10 Essential (primary) hypertension: Secondary | ICD-10-CM | POA: Diagnosis not present

## 2018-09-27 DIAGNOSIS — E1140 Type 2 diabetes mellitus with diabetic neuropathy, unspecified: Secondary | ICD-10-CM | POA: Diagnosis not present

## 2018-09-27 DIAGNOSIS — Z7984 Long term (current) use of oral hypoglycemic drugs: Secondary | ICD-10-CM | POA: Diagnosis not present

## 2018-09-27 DIAGNOSIS — E1151 Type 2 diabetes mellitus with diabetic peripheral angiopathy without gangrene: Secondary | ICD-10-CM | POA: Diagnosis not present

## 2018-09-27 DIAGNOSIS — B351 Tinea unguium: Secondary | ICD-10-CM | POA: Diagnosis not present

## 2018-10-03 DIAGNOSIS — R600 Localized edema: Secondary | ICD-10-CM | POA: Diagnosis not present

## 2018-10-03 DIAGNOSIS — I5030 Unspecified diastolic (congestive) heart failure: Secondary | ICD-10-CM | POA: Diagnosis not present

## 2018-10-03 DIAGNOSIS — N183 Chronic kidney disease, stage 3 (moderate): Secondary | ICD-10-CM | POA: Diagnosis not present

## 2018-10-03 DIAGNOSIS — J069 Acute upper respiratory infection, unspecified: Secondary | ICD-10-CM | POA: Diagnosis not present

## 2018-10-11 DIAGNOSIS — Z79899 Other long term (current) drug therapy: Secondary | ICD-10-CM | POA: Diagnosis not present

## 2018-11-01 DIAGNOSIS — I10 Essential (primary) hypertension: Secondary | ICD-10-CM | POA: Diagnosis not present

## 2018-11-01 DIAGNOSIS — E119 Type 2 diabetes mellitus without complications: Secondary | ICD-10-CM | POA: Diagnosis not present

## 2018-11-01 DIAGNOSIS — I5030 Unspecified diastolic (congestive) heart failure: Secondary | ICD-10-CM | POA: Diagnosis not present

## 2018-11-08 ENCOUNTER — Encounter: Payer: Self-pay | Admitting: Interventional Cardiology

## 2018-11-08 ENCOUNTER — Ambulatory Visit (INDEPENDENT_AMBULATORY_CARE_PROVIDER_SITE_OTHER): Payer: Medicare Other | Admitting: Interventional Cardiology

## 2018-11-08 ENCOUNTER — Ambulatory Visit (INDEPENDENT_AMBULATORY_CARE_PROVIDER_SITE_OTHER): Payer: Medicare Other | Admitting: *Deleted

## 2018-11-08 VITALS — BP 128/80 | HR 100 | Ht 59.0 in | Wt 203.0 lb

## 2018-11-08 DIAGNOSIS — I10 Essential (primary) hypertension: Secondary | ICD-10-CM | POA: Diagnosis not present

## 2018-11-08 DIAGNOSIS — Z950 Presence of cardiac pacemaker: Secondary | ICD-10-CM | POA: Diagnosis not present

## 2018-11-08 DIAGNOSIS — I4430 Unspecified atrioventricular block: Secondary | ICD-10-CM

## 2018-11-08 DIAGNOSIS — I5032 Chronic diastolic (congestive) heart failure: Secondary | ICD-10-CM

## 2018-11-08 LAB — CUP PACEART INCLINIC DEVICE CHECK
Battery Impedance: 1918 Ohm
Battery Voltage: 2.74 V
Brady Statistic AP VS Percent: 0 %
Date Time Interrogation Session: 20191219153957
Implantable Lead Implant Date: 20130118
Implantable Lead Location: 753860
Implantable Lead Model: 5076
Implantable Pulse Generator Implant Date: 20130118
Lead Channel Impedance Value: 410 Ohm
Lead Channel Pacing Threshold Amplitude: 0.875 V
Lead Channel Pacing Threshold Amplitude: 1 V
Lead Channel Pacing Threshold Pulse Width: 0.4 ms
Lead Channel Pacing Threshold Pulse Width: 0.4 ms
Lead Channel Pacing Threshold Pulse Width: 0.4 ms
Lead Channel Setting Pacing Amplitude: 2 V
Lead Channel Setting Pacing Amplitude: 2.5 V
MDC IDC LEAD IMPLANT DT: 20130118
MDC IDC LEAD LOCATION: 753859
MDC IDC MSMT BATTERY REMAINING LONGEVITY: 29 mo
MDC IDC MSMT LEADCHNL RA PACING THRESHOLD AMPLITUDE: 1 V
MDC IDC MSMT LEADCHNL RA PACING THRESHOLD AMPLITUDE: 1.25 V
MDC IDC MSMT LEADCHNL RA PACING THRESHOLD PULSEWIDTH: 0.4 ms
MDC IDC MSMT LEADCHNL RA SENSING INTR AMPL: 4 mV
MDC IDC MSMT LEADCHNL RV IMPEDANCE VALUE: 498 Ohm
MDC IDC SET LEADCHNL RV PACING PULSEWIDTH: 0.4 ms
MDC IDC SET LEADCHNL RV SENSING SENSITIVITY: 4 mV
MDC IDC STAT BRADY AP VP PERCENT: 1 %
MDC IDC STAT BRADY AS VP PERCENT: 99 %
MDC IDC STAT BRADY AS VS PERCENT: 0 %

## 2018-11-08 NOTE — Progress Notes (Signed)
Pacemaker check in clinic. Normal device function. Thresholds, sensing, impedances consistent with previous measurements. Device programmed to maximize longevity. 3 VHR longest 6sec w/ max V rate of 175bpm. Device programmed at appropriate safety margins. Histogram distribution appropriate for patient activity level. Device programmed to optimize intrinsic conduction. Estimated longevity 2.5 years. ROV 03/2019 w/ GT

## 2018-11-08 NOTE — Progress Notes (Addendum)
Cardiology Office Note   Date:  11/08/2018   ID:  [name], DOB Mar 26, 1931, MRN [id number]  PCP:  Patient, No Pcp Per    No chief complaint on file.  Chronic diastolic heart failure  Wt Readings from Last 3 Encounters:  11/08/18 203 lb (92.1 kg)  08/21/17 215 lb (97.5 kg)  12/15/16 215 lb (97.5 kg)       History of Present Illness: [name] is a 84 y.o. female  With chronic diastolic heart failure and permanent pacemaker placement. She is wheelchair-bound.  She is managed with furosemide and metolazone.  Uses wheelchair to get around.  SHOB is stable.  Leg edema stable.  Denies : Chest pain. Dizziness.  Nitroglycerin use. Orthopnea. Palpitations. Paroxysmal nocturnal dyspnea.  Syncope.   Past Medical History:  Diagnosis Date  . Arthritis    "qwhere"  . AV block     2:1  January 14, 2011 / pacemaker plan when cellulitis resolved in length  . Bradycardia    2:1  AV block  . Cellulitis    Superficial cellulitis right lower leg   January 14, 2011  . Chronic diastolic CHF (congestive heart failure) (HCC)   . Diabetes mellitus without complication (HCC)   . Edema    . EF 65-70%, echo, November 29, 2010  . Ejection fraction    EF 65%, echo, January, 2012  . Hypertension   . Hypokalemia    October, 2012  . Murmur    No significant valvular disease by echo, January, 2012  . Pacemaker    January, 2013 Medtronic Sensa  . Pulmonary hypertension (HCC)     46 mmHg... echo... January, 2012    Past Surgical History:  Procedure Laterality Date  . CHOLECYSTECTOMY    . HIP ARTHROPLASTY Right   . INSERT / REPLACE / REMOVE PACEMAKER  11/2011  . PARTIAL HYSTERECTOMY     vaginal  . PERMANENT PACEMAKER INSERTION N/A 12/09/2011   Procedure: PERMANENT PACEMAKER INSERTION;  Surgeon: Marinus Maw, MD;  Location: [facility] CATH LAB;  Service: Cardiovascular;  Laterality: N/A;     Current Outpatient Medications  Medication Sig Dispense Refill  . allopurinol  (ZYLOPRIM) 100 MG tablet Take 200 mg by mouth daily.    . carvedilol (COREG) 6.25 MG tablet Take 6.25 mg by mouth 2 (two) times daily with a meal.    . cholecalciferol (VITAMIN D) 1000 UNITS tablet Take 1,000 Units by mouth daily.    . diphenhydrAMINE (BENADRYL) 25 mg capsule Take 1 capsule (25 mg total) by mouth every 6 (six) hours as needed for itching. 30 capsule 0  . furosemide (LASIX) 40 MG tablet Take 40 mg by mouth daily. Take one tab in the afternoon.    . furosemide (LASIX) 80 MG tablet Take 80 mg by mouth every morning.    . gabapentin (NEURONTIN) 300 MG capsule Take 1 capsule (300 mg total) by mouth 3 (three) times daily. 60 capsule 0  . HYDROcodone-acetaminophen (NORCO) 10-325 MG tablet Take 1 tablet daily as needed by mouth.    [name] JANUVIA 25 MG tablet Take 25 mg daily by mouth.    . loratadine (CLARITIN) 10 MG tablet Take 10 mg by mouth daily.    . magnesium oxide (MAG-OX) 400 (241.3 MG) MG tablet TAKE ONE TABLET BY MOUTH ONE TIME DAILY 30 tablet 4  . Menthol, Topical Analgesic, (BIOFREEZE COLORLESS EX) Apply 1 application topically 2 (two) times daily.    . metFORMIN (GLUCOPHAGE) 850  MG tablet Take 500 mg by mouth 2 (two) times daily.     . metolazone (ZAROXOLYN) 2.5 MG tablet Take 2.5 mg by mouth once a week. TAKE EVERY OTHER DAY 30 MINUTES BEFORE LASIX    . Multiple Vitamins-Minerals (CERTA-VITE PO) Take 1 tablet by mouth daily.    . nitroGLYCERIN (NITROSTAT) 0.4 MG SL tablet Place 0.4 mg under the tongue every 5 (five) minutes as needed for chest pain (X 3 DOSES FOR CHEST PAIN).    [name]. polyethylene glycol powder (GLYCOLAX/MIRALAX) powder Take 17 g by mouth daily.    . potassium chloride SA (K-DUR,KLOR-CON) 20 MEQ tablet Take 80 mEq by mouth daily. Takes 40 meq in the am and 40 meq in the evening.     No current facility-administered medications for this visit.     Allergies:   Aspirin; Tramadol; and Naproxen    Social History:  The patient  reports that she has never smoked.  She has never used smokeless tobacco. She reports that she does not drink alcohol or use drugs.   Family History:  The patient's family history includes Cancer in her son; Cardiomyopathy in her father; Coronary artery disease in her son; Dementia in her mother; Diabetes in her father; Heart failure in her father.    ROS:  Please see the history of present illness.   Otherwise, review of systems are positive for weight loss.   All other systems are reviewed and negative.    PHYSICAL EXAM: VS:  BP 128/80   Pulse 100   Ht 4\' 11"  (1.499 m)   Wt 203 lb (92.1 kg) Comment: pt unable to get on scale, refused.  SpO2 95%   BMI 41.00 kg/m  , BMI Body mass index is 41 kg/m. GEN: Well nourished, well developed, in no acute distress  HEENT: normal  Neck: no JVD, carotid bruits, or masses Cardiac: RRR; no murmurs, rubs, or gallops,no edema  Respiratory:  clear to auscultation bilaterally, normal work of breathing GI: soft, nontender, nondistended, + BS, obese MS: no deformity or atrophy  Skin: warm and dry, no rash Neuro:  Strength and sensation are intact Psych: euthymic mood, full affect   EKG:   The ekg ordered today demonstrates    Recent Labs: 04/12/2018: ALT 9; BUN 19; Creatinine, Ser 0.78; Potassium 3.4; Sodium 134   Lipid Panel No results found for: CHOL, TRIG, HDL, CHOLHDL, VLDL, LDLCALC, LDLDIRECT   Other studies Reviewed: Additional studies/ records that were reviewed today with results demonstrating: Normal CR and potassium in 5/19..   ASSESSMENT AND PLAN:  1. Chronic diastolic heart failure: Low-salt diet.  Daily weights would also be helpful.  Continue diuretics. 2. HTN: The current medical regimen is effective;  continue present plan and medications. 3. Obesity: Healthy diet.  She has lost weight since last year.  She reports some salt intake which I asked her to minimize. 4. Pacer: Followed by Dr. Ladona Ridgelaylor.  Device check today.   Current medicines are reviewed at  length with the patient today.  The patient concerns regarding her medicines were addressed.  The following changes have been made:  No change  Labs/ tests ordered today include: CBC, CMet No orders of the defined types were placed in this encounter.   Recommend 150 minutes/week of aerobic exercise Low fat, low carb, high fiber diet recommended  Disposition:   FU in 1 year   Signed, Lance MussJayadeep Ailani Governale, MD  11/08/2018 1:31 PM    [facility] [address], [place], [place]  [address] Phone: [phone]; Fax: [phone]

## 2018-11-08 NOTE — Patient Instructions (Signed)
Medication Instructions:  Your physician recommends that you continue on your current medications as directed. Please refer to the Current Medication list given to you today.  If you need a refill on your cardiac medications before your next appointment, please call your pharmacy.   Lab work: BMET, CBC and liver today  If you have labs (blood work) drawn today and your tests are completely normal, you will receive your results only by: [name] MyChart Message (if you have MyChart) OR . A paper copy in the mail If you have any lab test that is abnormal or we need to change your treatment, we will call you to review the results.  Testing/Procedures: None  Follow-Up: At [facility], you and your health needs are our priority.  As part of our continuing mission to provide you with exceptional heart care, we have created designated Provider Care Teams.  These Care Teams include your primary Cardiologist (physician) and Advanced Practice Providers (APPs -  Physician Assistants and Nurse Practitioners) who all work together to provide you with the care you need, when you need it. You will need a follow up appointment in 12 months.  Please call our office 2 months in advance to schedule this appointment.  You may see Dr. Eldridge Dace or one of the following Advanced Practice Providers on your designated Care Team:   Moodus, PA-C Ronie Spies, PA-C . Jacolyn Reedy, PA-C  Any Other Special Instructions Will Be Listed Below (If Applicable).

## 2018-11-09 LAB — BASIC METABOLIC PANEL
BUN/Creatinine Ratio: 21 (ref 12–28)
BUN: 19 mg/dL (ref 8–27)
CO2: 27 mmol/L (ref 20–29)
CREATININE: 0.92 mg/dL (ref 0.57–1.00)
Calcium: 10.3 mg/dL (ref 8.7–10.3)
Chloride: 90 mmol/L — ABNORMAL LOW (ref 96–106)
GFR calc Af Amer: 66 mL/min/{1.73_m2} (ref >59)
GFR calc non Af Amer: 57 mL/min/{1.73_m2} — ABNORMAL LOW (ref >59)
GLUCOSE: 123 mg/dL — AB (ref 65–99)
Potassium: 3.6 mmol/L (ref 3.5–5.2)
SODIUM: 137 mmol/L (ref 134–144)

## 2018-11-09 LAB — HEPATIC FUNCTION PANEL
ALBUMIN: 4.2 g/dL (ref 3.5–4.7)
ALT: 11 IU/L (ref 0–32)
AST: 30 IU/L (ref 0–40)
Alkaline Phosphatase: 121 IU/L — ABNORMAL HIGH (ref 39–117)
BILIRUBIN TOTAL: 0.3 mg/dL (ref 0.0–1.2)
BILIRUBIN, DIRECT: 0.08 mg/dL (ref 0.00–0.40)
Total Protein: 7.7 g/dL (ref 6.0–8.5)

## 2018-11-09 LAB — CBC
HEMATOCRIT: 39.8 % (ref 34.0–46.6)
Hemoglobin: 13.1 g/dL (ref 11.1–15.9)
MCH: 29.9 pg (ref 26.6–33.0)
MCHC: 32.9 g/dL (ref 31.5–35.7)
MCV: 91 fL (ref 79–97)
PLATELETS: 288 10*3/uL (ref 150–450)
RBC: 4.38 x10E6/uL (ref 3.77–5.28)
RDW: 13.9 % (ref 12.3–15.4)
WBC: 7.3 10*3/uL (ref 3.4–10.8)

## 2018-11-15 ENCOUNTER — Encounter: Payer: Self-pay | Admitting: *Deleted

## 2018-11-17 DIAGNOSIS — M109 Gout, unspecified: Secondary | ICD-10-CM | POA: Diagnosis not present

## 2018-11-27 DIAGNOSIS — E119 Type 2 diabetes mellitus without complications: Secondary | ICD-10-CM | POA: Diagnosis not present

## 2018-11-27 DIAGNOSIS — Z7984 Long term (current) use of oral hypoglycemic drugs: Secondary | ICD-10-CM | POA: Diagnosis not present

## 2018-11-27 DIAGNOSIS — H25813 Combined forms of age-related cataract, bilateral: Secondary | ICD-10-CM | POA: Diagnosis not present

## 2018-12-24 ENCOUNTER — Telehealth: Payer: Self-pay | Admitting: Internal Medicine

## 2018-12-24 NOTE — Telephone Encounter (Signed)
Left detailed message per DPR.  Advised Pt sees Dr. Ladona Ridgel yearly in May.  Then because Pt does not do remote checks for her device she has to be seen by device clinic for a device check 6 months after/before her yearly appt with Dr. Ladona Ridgel.  Left this nurse name and # for call back if any further questions.

## 2018-12-24 NOTE — Telephone Encounter (Signed)
New Message:    Pt's daughter wants to know if pt still needs an appointment with Dr Ladona Ridgel in May, since she saw Levora Angel in December?

## 2018-12-31 DIAGNOSIS — E1140 Type 2 diabetes mellitus with diabetic neuropathy, unspecified: Secondary | ICD-10-CM | POA: Diagnosis not present

## 2018-12-31 DIAGNOSIS — L603 Nail dystrophy: Secondary | ICD-10-CM | POA: Diagnosis not present

## 2018-12-31 DIAGNOSIS — Z7984 Long term (current) use of oral hypoglycemic drugs: Secondary | ICD-10-CM | POA: Diagnosis not present

## 2018-12-31 DIAGNOSIS — B351 Tinea unguium: Secondary | ICD-10-CM | POA: Diagnosis not present

## 2019-01-10 DIAGNOSIS — I5030 Unspecified diastolic (congestive) heart failure: Secondary | ICD-10-CM | POA: Diagnosis not present

## 2019-01-10 DIAGNOSIS — E119 Type 2 diabetes mellitus without complications: Secondary | ICD-10-CM | POA: Diagnosis not present

## 2019-01-10 DIAGNOSIS — I10 Essential (primary) hypertension: Secondary | ICD-10-CM | POA: Diagnosis not present

## 2019-01-14 DIAGNOSIS — Z79899 Other long term (current) drug therapy: Secondary | ICD-10-CM | POA: Diagnosis not present

## 2019-02-06 DIAGNOSIS — M109 Gout, unspecified: Secondary | ICD-10-CM | POA: Diagnosis not present

## 2019-02-06 DIAGNOSIS — E1140 Type 2 diabetes mellitus with diabetic neuropathy, unspecified: Secondary | ICD-10-CM | POA: Diagnosis not present

## 2019-02-06 DIAGNOSIS — I5030 Unspecified diastolic (congestive) heart failure: Secondary | ICD-10-CM | POA: Diagnosis not present

## 2019-02-27 DIAGNOSIS — I5030 Unspecified diastolic (congestive) heart failure: Secondary | ICD-10-CM | POA: Diagnosis not present

## 2019-02-27 DIAGNOSIS — K5900 Constipation, unspecified: Secondary | ICD-10-CM | POA: Diagnosis not present

## 2019-02-27 DIAGNOSIS — M109 Gout, unspecified: Secondary | ICD-10-CM | POA: Diagnosis not present

## 2019-02-27 DIAGNOSIS — R600 Localized edema: Secondary | ICD-10-CM | POA: Diagnosis not present

## 2019-03-06 DIAGNOSIS — E119 Type 2 diabetes mellitus without complications: Secondary | ICD-10-CM | POA: Diagnosis not present

## 2019-03-06 DIAGNOSIS — I10 Essential (primary) hypertension: Secondary | ICD-10-CM | POA: Diagnosis not present

## 2019-03-06 DIAGNOSIS — N183 Chronic kidney disease, stage 3 (moderate): Secondary | ICD-10-CM | POA: Diagnosis not present

## 2019-03-06 DIAGNOSIS — M109 Gout, unspecified: Secondary | ICD-10-CM | POA: Diagnosis not present

## 2019-03-07 DIAGNOSIS — M2141 Flat foot [pes planus] (acquired), right foot: Secondary | ICD-10-CM | POA: Diagnosis not present

## 2019-03-07 DIAGNOSIS — E1140 Type 2 diabetes mellitus with diabetic neuropathy, unspecified: Secondary | ICD-10-CM | POA: Diagnosis not present

## 2019-03-07 DIAGNOSIS — E1151 Type 2 diabetes mellitus with diabetic peripheral angiopathy without gangrene: Secondary | ICD-10-CM | POA: Diagnosis not present

## 2019-03-07 DIAGNOSIS — Z7984 Long term (current) use of oral hypoglycemic drugs: Secondary | ICD-10-CM | POA: Diagnosis not present

## 2019-03-07 DIAGNOSIS — B351 Tinea unguium: Secondary | ICD-10-CM | POA: Diagnosis not present

## 2019-03-07 DIAGNOSIS — L603 Nail dystrophy: Secondary | ICD-10-CM | POA: Diagnosis not present

## 2019-03-07 DIAGNOSIS — M2142 Flat foot [pes planus] (acquired), left foot: Secondary | ICD-10-CM | POA: Diagnosis not present

## 2019-04-08 ENCOUNTER — Telehealth: Payer: Self-pay | Admitting: Internal Medicine

## 2019-04-08 NOTE — Telephone Encounter (Signed)
[name] from [facility] wanted to know if it was absolutely necessary for the patient to come in for her appt on 05/26. The facility does not want any residents to leave. The facility wants to know if the nurse in house can check the status of the pacemaker and just report the results back to Dr. Ladona Ridgel.  The number given is the main facility number. When the receptionist answers, just ask for [facility]

## 2019-04-09 DIAGNOSIS — G894 Chronic pain syndrome: Secondary | ICD-10-CM | POA: Diagnosis not present

## 2019-04-10 DIAGNOSIS — G894 Chronic pain syndrome: Secondary | ICD-10-CM | POA: Diagnosis not present

## 2019-04-11 DIAGNOSIS — G894 Chronic pain syndrome: Secondary | ICD-10-CM | POA: Diagnosis not present

## 2019-04-11 NOTE — Telephone Encounter (Signed)
F/u    Attempted to call facility to rs appt on 05.26.20 to Aug per RN. No answer. Will try again.

## 2019-04-12 DIAGNOSIS — G894 Chronic pain syndrome: Secondary | ICD-10-CM | POA: Diagnosis not present

## 2019-04-13 DIAGNOSIS — G894 Chronic pain syndrome: Secondary | ICD-10-CM | POA: Diagnosis not present

## 2019-04-13 DIAGNOSIS — E1122 Type 2 diabetes mellitus with diabetic chronic kidney disease: Secondary | ICD-10-CM | POA: Diagnosis not present

## 2019-04-13 DIAGNOSIS — G8929 Other chronic pain: Secondary | ICD-10-CM | POA: Diagnosis not present

## 2019-04-13 DIAGNOSIS — M109 Gout, unspecified: Secondary | ICD-10-CM | POA: Diagnosis not present

## 2019-04-13 DIAGNOSIS — G63 Polyneuropathy in diseases classified elsewhere: Secondary | ICD-10-CM | POA: Diagnosis not present

## 2019-04-14 DIAGNOSIS — G894 Chronic pain syndrome: Secondary | ICD-10-CM | POA: Diagnosis not present

## 2019-04-15 DIAGNOSIS — G894 Chronic pain syndrome: Secondary | ICD-10-CM | POA: Diagnosis not present

## 2019-04-16 ENCOUNTER — Telehealth: Payer: Self-pay | Admitting: Internal Medicine

## 2019-04-16 ENCOUNTER — Telehealth: Payer: Medicare Other | Admitting: Internal Medicine

## 2019-04-16 ENCOUNTER — Other Ambulatory Visit: Payer: Self-pay

## 2019-04-16 DIAGNOSIS — G894 Chronic pain syndrome: Secondary | ICD-10-CM | POA: Diagnosis not present

## 2019-04-16 NOTE — Telephone Encounter (Signed)
F/U Message          The Facility is calling because no one called them for their appointment today. I see they have the status of "No Show, [name] states they have been waiting for two hours and no one called. Pls call to explain.

## 2019-04-16 NOTE — Progress Notes (Incomplete)
Electrophysiology TeleHealth Note   Due to national recommendations of social distancing due to COVID 19, an audio/video telehealth visit is felt to be most appropriate for this patient at this time.  See MyChart message from today for the patient's consent to telehealth for [facility].   Date:  04/16/2019   ID:  [name], DOB 03-23-1931, MRN [id number]  Location: patient's home  Provider location: [address], [place]  Evaluation Performed: Follow-up visit  PCP:  Patient, No Pcp Per  Cardiologist:  No primary care provider on file. Electrophysiologist:  Dr Ladona Ridgel  Chief Complaint:  ***  History of Present Illness:    [name] is a 85 y.o. female who presents via audio/video conferencing for a telehealth visit today.  Since last being seen in our clinic, the patient reports doing very well.  Today, she denies symptoms of palpitations, chest pain, shortness of breath,  lower extremity edema, dizziness, presyncope, or syncope.  The patient is otherwise without complaint today.  The patient denies symptoms of fevers, chills, cough, or new SOB worrisome for COVID 19.  Past Medical History:  Diagnosis Date  . Arthritis    "qwhere"  . AV block     2:1  January 14, 2011 / pacemaker plan when cellulitis resolved in length  . Bradycardia    2:1  AV block  . Cellulitis    Superficial cellulitis right lower leg   January 14, 2011  . Chronic diastolic CHF (congestive heart failure) (HCC)   . Diabetes mellitus without complication (HCC)   . Edema    . EF 65-70%, echo, November 29, 2010  . Ejection fraction    EF 65%, echo, January, 2012  . Hypertension   . Hypokalemia    October, 2012  . Murmur    No significant valvular disease by echo, January, 2012  . Pacemaker    January, 2013 Medtronic Sensa  . Pulmonary hypertension (HCC)     46 mmHg... echo... January, 2012    Past Surgical History:  Procedure Laterality Date  . CHOLECYSTECTOMY     . HIP ARTHROPLASTY Right   . INSERT / REPLACE / REMOVE PACEMAKER  11/2011  . PARTIAL HYSTERECTOMY     vaginal  . PERMANENT PACEMAKER INSERTION N/A 12/09/2011   Procedure: PERMANENT PACEMAKER INSERTION;  Surgeon: Marinus Maw, MD;  Location: [facility] CATH LAB;  Service: Cardiovascular;  Laterality: N/A;    Current Outpatient Medications  Medication Sig Dispense Refill  . allopurinol (ZYLOPRIM) 100 MG tablet Take 200 mg by mouth daily.    . carvedilol (COREG) 6.25 MG tablet Take 6.25 mg by mouth 2 (two) times daily with a meal.    . cholecalciferol (VITAMIN D) 1000 UNITS tablet Take 1,000 Units by mouth daily.    . diphenhydrAMINE (BENADRYL) 25 mg capsule Take 1 capsule (25 mg total) by mouth every 6 (six) hours as needed for itching. 30 capsule 0  . furosemide (LASIX) 40 MG tablet Take 40 mg by mouth daily. Take one tab in the afternoon.    . furosemide (LASIX) 80 MG tablet Take 80 mg by mouth every morning.    . gabapentin (NEURONTIN) 300 MG capsule Take 1 capsule (300 mg total) by mouth 3 (three) times daily. 60 capsule 0  . HYDROcodone-acetaminophen (NORCO) 10-325 MG tablet Take 1 tablet daily as needed by mouth.    [name] JANUVIA 25 MG tablet Take 25 mg daily by mouth.    . loratadine (  CLARITIN) 10 MG tablet Take 10 mg by mouth daily.    . magnesium oxide (MAG-OX) 400 (241.3 MG) MG tablet TAKE ONE TABLET BY MOUTH ONE TIME DAILY 30 tablet 4  . Menthol, Topical Analgesic, (BIOFREEZE COLORLESS EX) Apply 1 application topically 2 (two) times daily.    . metFORMIN (GLUCOPHAGE) 850 MG tablet Take 500 mg by mouth 2 (two) times daily.     . metolazone (ZAROXOLYN) 2.5 MG tablet Take 2.5 mg by mouth once a week. TAKE EVERY OTHER DAY 30 MINUTES BEFORE LASIX    . Multiple Vitamins-Minerals (CERTA-VITE PO) Take 1 tablet by mouth daily.    . nitroGLYCERIN (NITROSTAT) 0.4 MG SL tablet Place 0.4 mg under the tongue every 5 (five) minutes as needed for chest pain (X 3 DOSES FOR CHEST PAIN).    [name] polyethylene  glycol powder (GLYCOLAX/MIRALAX) powder Take 17 g by mouth daily.    . potassium chloride SA (K-DUR,KLOR-CON) 20 MEQ tablet Take 80 mEq by mouth daily. Takes 40 meq in the am and 40 meq in the evening.     No current facility-administered medications for this visit.     Allergies:   Aspirin; Tramadol; and Naproxen   Social History:  The patient  reports that she has never smoked. She has never used smokeless tobacco. She reports that she does not drink alcohol or use drugs.   Family History:  The patient's *** family history includes Cancer in her son; Cardiomyopathy in her father; Coronary artery disease in her son; Dementia in her mother; Diabetes in her father; Heart failure in her father.   ROS:  Please see the history of present illness.   All other systems are personally reviewed and negative.    Exam:    Vital Signs:  There were no vitals taken for this visit.  Well appearing, alert and conversant, regular work of breathing,  good skin color Eyes- anicteric, neuro- grossly intact, skin- no apparent rash or lesions or cyanosis, mouth- oral mucosa is pink   Labs/Other Tests and Data Reviewed:    Recent Labs: 11/08/2018: ALT 11; BUN 19; Creatinine, Ser 0.92; Hemoglobin 13.1; Platelets 288; Potassium 3.6; Sodium 137   Wt Readings from Last 3 Encounters:  11/08/18 203 lb (92.1 kg)  08/21/17 215 lb (97.5 kg)  12/15/16 215 lb (97.5 kg)     Other studies personally reviewed: Additional studies/ records that were reviewed today include: ***  Review of the above records today demonstrates: as above Prior radiographs: ***  The patient presents wearable device technology report for my review today. On my review, the patient presents with Apple Watch tracings from *** . The tracings reveal ***  Last device remote is reviewed from [employer] PDF dated *** which reveals normal device function, no arrhythmias ***   ASSESSMENT & PLAN:    1.  ***   COVID 19 screen The patient  denies symptoms of COVID 19 at this time.  The importance of social distancing was discussed today.  Follow-up:  *** Next remote: ***  Current medicines are reviewed at length with the patient today.   The patient {ACTIONS; HAS/DOES NOT HAVE:19233} concerns regarding her medicines.  The following changes were made today:  {NONE DEFAULTED:18576::"none"}  Labs/ tests ordered today include: *** No orders of the defined types were placed in this encounter.    Patient Risk:  after full review of this patients clinical status, I feel that they are at moderate risk at this time.  Today, I have  spent *** minutes with the patient with telehealth technology discussing *** .    Signed, Lewayne Bunting, MD  04/16/2019 3:00 PM     [facility] [address] [place] [address] [phone] (office) [phone] (fax)

## 2019-04-16 NOTE — Telephone Encounter (Signed)
Returned call to facility.  Receptionist answered the phone.  This nurse asked for [name].  Receptionist forwarded my call [name]'s office.  Phone rang and rang with no answer and no voicemail.  Please notify facility that Dr. Ladona Ridgel did call at 3:00 pm and there was no answer.  Please reschedule Pt.  May be best to schedule in August when Pt's can be seen in clinic.

## 2019-04-18 DIAGNOSIS — G894 Chronic pain syndrome: Secondary | ICD-10-CM | POA: Diagnosis not present

## 2019-04-18 DIAGNOSIS — E039 Hypothyroidism, unspecified: Secondary | ICD-10-CM | POA: Diagnosis not present

## 2019-04-18 DIAGNOSIS — Z79899 Other long term (current) drug therapy: Secondary | ICD-10-CM | POA: Diagnosis not present

## 2019-04-18 NOTE — Telephone Encounter (Signed)
Attempted outreach to facility.  Received message that operator was not available.  Did not leave a message on unsecured VM.  Pt needs to be scheduled for in office device check in August.    Please schedule if facility calls back

## 2019-04-19 DIAGNOSIS — G894 Chronic pain syndrome: Secondary | ICD-10-CM | POA: Diagnosis not present

## 2019-04-22 DIAGNOSIS — Z20828 Contact with and (suspected) exposure to other viral communicable diseases: Secondary | ICD-10-CM | POA: Diagnosis not present

## 2019-04-22 DIAGNOSIS — Z1383 Encounter for screening for respiratory disorder NEC: Secondary | ICD-10-CM | POA: Diagnosis not present

## 2019-04-22 DIAGNOSIS — G894 Chronic pain syndrome: Secondary | ICD-10-CM | POA: Diagnosis not present

## 2019-04-23 DIAGNOSIS — G894 Chronic pain syndrome: Secondary | ICD-10-CM | POA: Diagnosis not present

## 2019-04-24 DIAGNOSIS — G894 Chronic pain syndrome: Secondary | ICD-10-CM | POA: Diagnosis not present

## 2019-04-26 DIAGNOSIS — G894 Chronic pain syndrome: Secondary | ICD-10-CM | POA: Diagnosis not present

## 2019-04-27 DIAGNOSIS — G894 Chronic pain syndrome: Secondary | ICD-10-CM | POA: Diagnosis not present

## 2019-04-29 DIAGNOSIS — G894 Chronic pain syndrome: Secondary | ICD-10-CM | POA: Diagnosis not present

## 2019-04-30 DIAGNOSIS — G894 Chronic pain syndrome: Secondary | ICD-10-CM | POA: Diagnosis not present

## 2019-05-01 DIAGNOSIS — E1122 Type 2 diabetes mellitus with diabetic chronic kidney disease: Secondary | ICD-10-CM | POA: Diagnosis not present

## 2019-05-01 DIAGNOSIS — G894 Chronic pain syndrome: Secondary | ICD-10-CM | POA: Diagnosis not present

## 2019-05-01 DIAGNOSIS — G63 Polyneuropathy in diseases classified elsewhere: Secondary | ICD-10-CM | POA: Diagnosis not present

## 2019-05-01 DIAGNOSIS — M109 Gout, unspecified: Secondary | ICD-10-CM | POA: Diagnosis not present

## 2019-05-02 DIAGNOSIS — G894 Chronic pain syndrome: Secondary | ICD-10-CM | POA: Diagnosis not present

## 2019-05-03 DIAGNOSIS — G894 Chronic pain syndrome: Secondary | ICD-10-CM | POA: Diagnosis not present

## 2019-05-09 DIAGNOSIS — I739 Peripheral vascular disease, unspecified: Secondary | ICD-10-CM | POA: Diagnosis not present

## 2019-05-09 DIAGNOSIS — L84 Corns and callosities: Secondary | ICD-10-CM | POA: Diagnosis not present

## 2019-05-09 DIAGNOSIS — L603 Nail dystrophy: Secondary | ICD-10-CM | POA: Diagnosis not present

## 2019-06-05 DIAGNOSIS — G8929 Other chronic pain: Secondary | ICD-10-CM | POA: Diagnosis not present

## 2019-06-05 DIAGNOSIS — E1122 Type 2 diabetes mellitus with diabetic chronic kidney disease: Secondary | ICD-10-CM | POA: Diagnosis not present

## 2019-06-05 DIAGNOSIS — G63 Polyneuropathy in diseases classified elsewhere: Secondary | ICD-10-CM | POA: Diagnosis not present

## 2019-06-05 DIAGNOSIS — M109 Gout, unspecified: Secondary | ICD-10-CM | POA: Diagnosis not present

## 2019-06-28 DIAGNOSIS — Z1383 Encounter for screening for respiratory disorder NEC: Secondary | ICD-10-CM | POA: Diagnosis not present

## 2019-06-28 DIAGNOSIS — Z20828 Contact with and (suspected) exposure to other viral communicable diseases: Secondary | ICD-10-CM | POA: Diagnosis not present

## 2019-07-04 DIAGNOSIS — Z1383 Encounter for screening for respiratory disorder NEC: Secondary | ICD-10-CM | POA: Diagnosis not present

## 2019-07-04 DIAGNOSIS — Z20828 Contact with and (suspected) exposure to other viral communicable diseases: Secondary | ICD-10-CM | POA: Diagnosis not present

## 2019-07-10 DIAGNOSIS — I5030 Unspecified diastolic (congestive) heart failure: Secondary | ICD-10-CM | POA: Diagnosis not present

## 2019-07-11 DIAGNOSIS — I5030 Unspecified diastolic (congestive) heart failure: Secondary | ICD-10-CM | POA: Diagnosis not present

## 2019-07-12 DIAGNOSIS — I5030 Unspecified diastolic (congestive) heart failure: Secondary | ICD-10-CM | POA: Diagnosis not present

## 2019-07-15 DIAGNOSIS — I5030 Unspecified diastolic (congestive) heart failure: Secondary | ICD-10-CM | POA: Diagnosis not present

## 2019-07-15 DIAGNOSIS — Z1383 Encounter for screening for respiratory disorder NEC: Secondary | ICD-10-CM | POA: Diagnosis not present

## 2019-07-15 DIAGNOSIS — Z20828 Contact with and (suspected) exposure to other viral communicable diseases: Secondary | ICD-10-CM | POA: Diagnosis not present

## 2019-07-16 DIAGNOSIS — I5030 Unspecified diastolic (congestive) heart failure: Secondary | ICD-10-CM | POA: Diagnosis not present

## 2019-07-17 DIAGNOSIS — I5030 Unspecified diastolic (congestive) heart failure: Secondary | ICD-10-CM | POA: Diagnosis not present

## 2019-07-18 DIAGNOSIS — I5030 Unspecified diastolic (congestive) heart failure: Secondary | ICD-10-CM | POA: Diagnosis not present

## 2019-07-19 ENCOUNTER — Telehealth: Payer: Self-pay | Admitting: Internal Medicine

## 2019-07-19 NOTE — Telephone Encounter (Signed)
  Daughter is calling because her mom has an appt on 07/22/19 and she wants to know if someone from our office can call her and face time her during the appt.

## 2019-07-22 ENCOUNTER — Encounter: Payer: Medicare Other | Admitting: Internal Medicine

## 2019-07-22 NOTE — Telephone Encounter (Signed)
Will notify [facility]

## 2019-07-25 DIAGNOSIS — R627 Adult failure to thrive: Secondary | ICD-10-CM | POA: Diagnosis not present

## 2019-07-25 DIAGNOSIS — Z7984 Long term (current) use of oral hypoglycemic drugs: Secondary | ICD-10-CM | POA: Diagnosis not present

## 2019-07-25 DIAGNOSIS — E876 Hypokalemia: Secondary | ICD-10-CM | POA: Diagnosis not present

## 2019-07-25 DIAGNOSIS — I5030 Unspecified diastolic (congestive) heart failure: Secondary | ICD-10-CM | POA: Diagnosis not present

## 2019-07-25 DIAGNOSIS — E1142 Type 2 diabetes mellitus with diabetic polyneuropathy: Secondary | ICD-10-CM | POA: Diagnosis not present

## 2019-07-25 DIAGNOSIS — N183 Chronic kidney disease, stage 3 (moderate): Secondary | ICD-10-CM | POA: Diagnosis not present

## 2019-07-25 DIAGNOSIS — H25813 Combined forms of age-related cataract, bilateral: Secondary | ICD-10-CM | POA: Diagnosis not present

## 2019-07-25 DIAGNOSIS — R011 Cardiac murmur, unspecified: Secondary | ICD-10-CM | POA: Diagnosis not present

## 2019-07-25 DIAGNOSIS — I130 Hypertensive heart and chronic kidney disease with heart failure and stage 1 through stage 4 chronic kidney disease, or unspecified chronic kidney disease: Secondary | ICD-10-CM | POA: Diagnosis not present

## 2019-07-25 DIAGNOSIS — U071 COVID-19: Secondary | ICD-10-CM | POA: Diagnosis not present

## 2019-07-25 DIAGNOSIS — M1990 Unspecified osteoarthritis, unspecified site: Secondary | ICD-10-CM | POA: Diagnosis not present

## 2019-07-25 DIAGNOSIS — G894 Chronic pain syndrome: Secondary | ICD-10-CM | POA: Diagnosis not present

## 2019-07-25 DIAGNOSIS — I739 Peripheral vascular disease, unspecified: Secondary | ICD-10-CM | POA: Diagnosis not present

## 2019-07-25 DIAGNOSIS — M109 Gout, unspecified: Secondary | ICD-10-CM | POA: Diagnosis not present

## 2019-07-25 DIAGNOSIS — G4762 Sleep related leg cramps: Secondary | ICD-10-CM | POA: Diagnosis not present

## 2019-07-25 DIAGNOSIS — Z950 Presence of cardiac pacemaker: Secondary | ICD-10-CM | POA: Diagnosis not present

## 2019-07-25 DIAGNOSIS — E1122 Type 2 diabetes mellitus with diabetic chronic kidney disease: Secondary | ICD-10-CM | POA: Diagnosis not present

## 2019-07-25 DIAGNOSIS — K5900 Constipation, unspecified: Secondary | ICD-10-CM | POA: Diagnosis not present

## 2019-07-25 DIAGNOSIS — F419 Anxiety disorder, unspecified: Secondary | ICD-10-CM | POA: Diagnosis not present

## 2019-07-25 DIAGNOSIS — J302 Other seasonal allergic rhinitis: Secondary | ICD-10-CM | POA: Diagnosis not present

## 2019-07-25 DIAGNOSIS — E1151 Type 2 diabetes mellitus with diabetic peripheral angiopathy without gangrene: Secondary | ICD-10-CM | POA: Diagnosis not present

## 2019-07-25 DIAGNOSIS — I4430 Unspecified atrioventricular block: Secondary | ICD-10-CM | POA: Diagnosis not present

## 2019-08-07 DIAGNOSIS — M109 Gout, unspecified: Secondary | ICD-10-CM | POA: Diagnosis not present

## 2019-08-07 DIAGNOSIS — G63 Polyneuropathy in diseases classified elsewhere: Secondary | ICD-10-CM | POA: Diagnosis not present

## 2019-08-07 DIAGNOSIS — E1122 Type 2 diabetes mellitus with diabetic chronic kidney disease: Secondary | ICD-10-CM | POA: Diagnosis not present

## 2019-08-07 DIAGNOSIS — G8929 Other chronic pain: Secondary | ICD-10-CM | POA: Diagnosis not present

## 2019-08-07 DIAGNOSIS — E349 Endocrine disorder, unspecified: Secondary | ICD-10-CM | POA: Diagnosis not present

## 2019-08-23 DIAGNOSIS — R293 Abnormal posture: Secondary | ICD-10-CM | POA: Diagnosis not present

## 2019-08-23 DIAGNOSIS — G894 Chronic pain syndrome: Secondary | ICD-10-CM | POA: Diagnosis not present

## 2019-08-23 DIAGNOSIS — Z8619 Personal history of other infectious and parasitic diseases: Secondary | ICD-10-CM | POA: Diagnosis not present

## 2019-09-11 DIAGNOSIS — I509 Heart failure, unspecified: Secondary | ICD-10-CM | POA: Diagnosis not present

## 2019-09-11 DIAGNOSIS — R609 Edema, unspecified: Secondary | ICD-10-CM | POA: Diagnosis not present

## 2019-09-11 DIAGNOSIS — E1122 Type 2 diabetes mellitus with diabetic chronic kidney disease: Secondary | ICD-10-CM | POA: Diagnosis not present

## 2019-09-11 DIAGNOSIS — M109 Gout, unspecified: Secondary | ICD-10-CM | POA: Diagnosis not present

## 2019-10-16 DIAGNOSIS — G629 Polyneuropathy, unspecified: Secondary | ICD-10-CM | POA: Diagnosis not present

## 2019-10-16 DIAGNOSIS — G8929 Other chronic pain: Secondary | ICD-10-CM | POA: Diagnosis not present

## 2019-10-16 DIAGNOSIS — E1122 Type 2 diabetes mellitus with diabetic chronic kidney disease: Secondary | ICD-10-CM | POA: Diagnosis not present

## 2019-10-16 DIAGNOSIS — I509 Heart failure, unspecified: Secondary | ICD-10-CM | POA: Diagnosis not present

## 2019-10-22 DIAGNOSIS — Z20828 Contact with and (suspected) exposure to other viral communicable diseases: Secondary | ICD-10-CM | POA: Diagnosis not present

## 2019-10-28 ENCOUNTER — Telehealth: Payer: Self-pay | Admitting: Interventional Cardiology

## 2019-10-28 NOTE — Telephone Encounter (Signed)
New Message  Patient's daughter is calling in to get approval to come to the patient's appointment on 11/20/19. States that patient is in a wheelchair and will need assistance. Please call and confirm.

## 2019-10-28 NOTE — Telephone Encounter (Signed)
I spoke with pt's daughter and told her one person could accompany pt to appointment due to use of wheelchair and needing assistance.

## 2019-11-18 NOTE — Progress Notes (Signed)
Cardiology Office Note Date:  11/20/2019  Patient ID:  Linden, Mikes [birth year], MRN [id number] PCP:  Patient, No Pcp Per  Cardiologist:  Dr. Eldridge Dace Electrophysiologist: Dr. Ladona Ridgel     Chief Complaint: over due device visit  History of Present Illness: [name] is a 85 y.o. female with history of DM, HLD, HTN, 2:1 block/CHB > PPM, chronic, lower extremity venous stasis and cellulitis, chronic CHF (diastolic)   She comes in today to be seen for Dr. Ladona Ridgel, last seen by him may 2018, he mentions at that time her LE venous stasis, edema had worsened and had developed severe cramps in her legs and is now unable to walk. She is bed bound, her diuretics adjusted.  Most recently saw Dr. Eldridge Dace Dec 2019, at that time mentioned wheelchair bound, SOB stable, edema managed with metolazone and lasix, no changes were made to her therapy.  She comes today accompanied by her daughter.  She reports doing "fine".  She lives at [facility], has not ambulated now in about 4 years.  She denies any CP, palpitations, no dizzy spells, near syncope or syncope.  She tends to be sleepy, mentions her pain medicine and gabapentin tend to make her sleepy.  She sees Dr. Eldridge Dace today after her device check today   Device information MDT dual chamber PPM, implanted 12/09/2011   Past Medical History:  Diagnosis Date  . Arthritis    "qwhere"  . AV block     2:1  January 14, 2011 / pacemaker plan when cellulitis resolved in length  . Bradycardia    2:1  AV block  . Cellulitis    Superficial cellulitis right lower leg   January 14, 2011  . Chronic diastolic CHF (congestive heart failure) (HCC)   . Diabetes mellitus without complication (HCC)   . Edema    . EF 65-70%, echo, November 29, 2010  . Ejection fraction    EF 65%, echo, January, 2012  . Hypertension   . Hypokalemia    October, 2012  . Murmur    No significant valvular disease by echo, January, 2012  . Pacemaker    January, 2013 Medtronic Sensa  . Pulmonary hypertension (HCC)     46 mmHg... echo... January, 2012    Past Surgical History:  Procedure Laterality Date  . CHOLECYSTECTOMY    . HIP ARTHROPLASTY Right   . INSERT / REPLACE / REMOVE PACEMAKER  11/2011  . PARTIAL HYSTERECTOMY     vaginal  . PERMANENT PACEMAKER INSERTION N/A 12/09/2011   Procedure: PERMANENT PACEMAKER INSERTION;  Surgeon: Marinus Maw, MD;  Location: [facility] CATH LAB;  Service: Cardiovascular;  Laterality: N/A;    Current Outpatient Medications  Medication Sig Dispense Refill  . allopurinol (ZYLOPRIM) 100 MG tablet Take 200 mg by mouth daily.    . carvedilol (COREG) 6.25 MG tablet Take 6.25 mg by mouth 2 (two) times daily with a meal.    . cholecalciferol (VITAMIN D) 1000 UNITS tablet Take 1,000 Units by mouth daily.    . diphenhydrAMINE (BENADRYL) 25 mg capsule Take 1 capsule (25 mg total) by mouth every 6 (six) hours as needed for itching. 30 capsule 0  . furosemide (LASIX) 40 MG tablet Take 40 mg by mouth daily. Take one tab in the afternoon.    . gabapentin (NEURONTIN) 300 MG capsule Take 1 capsule (300 mg total) by mouth 3 (three) times daily. 60 capsule 0  . HYDROcodone-acetaminophen (NORCO) 10-325 MG tablet Take 1 tablet  daily as needed by mouth.    [name] JANUVIA 25 MG tablet Take 25 mg daily by mouth.    . loratadine (CLARITIN) 10 MG tablet Take 10 mg by mouth daily.    . magnesium oxide (MAG-OX) 400 (241.3 MG) MG tablet TAKE ONE TABLET BY MOUTH ONE TIME DAILY 30 tablet 4  . Menthol, Topical Analgesic, (BIOFREEZE COLORLESS EX) Apply 1 application topically 2 (two) times daily.    . metFORMIN (GLUCOPHAGE) 850 MG tablet Take 500 mg by mouth 2 (two) times daily.     . metolazone (ZAROXOLYN) 2.5 MG tablet Take 2.5 mg by mouth daily. TAKE EVERY OTHER DAY 30 MINUTES BEFORE LASIX    . Multiple Vitamins-Minerals (CERTA-VITE PO) Take 1 tablet by mouth daily.    . nitroGLYCERIN (NITROSTAT) 0.4 MG SL tablet Place 0.4 mg under the  tongue every 5 (five) minutes as needed for chest pain (X 3 DOSES FOR CHEST PAIN).    [name] polyethylene glycol powder (GLYCOLAX/MIRALAX) powder Take 17 g by mouth daily.    . potassium chloride (KLOR-CON) 10 MEQ tablet Take 40 mEq by mouth daily.     No current facility-administered medications for this visit.    Allergies:   Aspirin, Tramadol, and Naproxen   Social History:  The patient  reports that she has never smoked. She has never used smokeless tobacco. She reports that she does not drink alcohol or use drugs.   Family History:  The patient's family history includes Cancer in her son; Cardiomyopathy in her father; Coronary artery disease in her son; Dementia in her mother; Diabetes in her father; Heart failure in her father.  ROS:  Please see the history of present illness.    All other systems are reviewed and otherwise negative.   PHYSICAL EXAM:  VS:  BP (!) 134/58   Pulse 100   Ht 4\' 11"  (1.499 m)   Wt 201 lb (91.2 kg)   BMI 40.60 kg/m  BMI: Body mass index is 40.6 kg/m. Well nourished, well developed, in no acute distress  HEENT: normocephalic, atraumatic  Neck: no JVD, carotid bruits or masses Cardiac:  RRR; no significant murmurs, no rubs, or gallops Lungs:  CTA b/l, no wheezing, rhonchi or rales  Abd: soft, nontender, obese MS: no deformity or atrophy Ext:  1++ edema, slight erythema (no heat) R>L  Skin: warm and dry, no rash Neuro:  No gross deficits appreciated Psych: euthymic mood, full affect  PPM site is stable, no tethering or discomfort   EKG:  Done today and reviewed by myself shows SR , V paced  PPM check done today and reviewed by myself Battery and lead measurements are good She is pacer dependent today AP <1% VP 99.3% 5 HVR, EGMs reviewed are NSVT, longest 4 seconds No AMS   08/13/2015: TTE Study Conclusions - Left ventricle: The cavity size was normal. Wall thickness was   increased in a pattern of mild LVH. Systolic function was    vigorous. The estimated ejection fraction was in the range of 65%   to 70%. Cannot determine diastolic function in setting of   tachycardia. - Aortic valve: Mildly calcified annulus. Trileaflet; mildly   thickened leaflets. Valve area (VTI): 1.88 cm^2. Valve area   (Vmax): 2.09 cm^2. - Mitral valve: Mildly calcified annulus. Mildly thickened leaflets   . - Atrial septum: No defect or patent foramen ovale was identified. - Technically difficult study.    Recent Labs: No results found for requested labs within last 8760  hours.  No results found for requested labs within last 8760 hours.   CrCl cannot be calculated (Patient's most recent lab result is older than the maximum 21 days allowed.).   Wt Readings from Last 3 Encounters:  11/20/19 201 lb (91.2 kg)  11/20/19 201 lb (91.2 kg)  11/08/18 203 lb (92.1 kg)     Other studies reviewed: Additional studies/records reviewed today include: summarized above  ASSESSMENT AND PLAN:  1. PPM     Intact function, no programming changes made     Battery estimate is 25 months     She does not want to enroll in remotes     See her back in 39mo device check  2. HTN     Looks OK     Deferred to Dr. Irish Lack  3. Chronic CHF (diastolic) 4. Chronic venous stasis     + edema, sounds chronic, she denies SOB, is non-ambulatory     Deferred to Dr. Irish Lack today    Disposition: F/u with as above, device clinic in 16mo, MD/APP visit w/EP in 1 year, sooner if needed.  She sees Dr. Irish Lack after me, further recommendations as per him.   Current medicines are reviewed at length with the patient today.  The patient did not have any concerns regarding medicines.  Venetia Night, PA-C 11/20/2019 3:01 PM     [place] [address] [phone] (office)  [phone] (fax)

## 2019-11-18 NOTE — Progress Notes (Signed)
Cardiology Office Note   Date:  11/20/2019   ID:  [name], [name] 10/07/1931, MRN [id number]  PCP:  Patient, No Pcp Per    No chief complaint on file.  Chronic diastolic heart failure  Wt Readings from Last 3 Encounters:  11/20/19 201 lb (91.2 kg)  11/20/19 201 lb (91.2 kg)  11/08/18 203 lb (92.1 kg)       History of Present Illness: [name] is a 85 y.o. female  Who previously saw Dr. Myrtis Ser.  She has chronic diastolic heart failure and permanent pacemaker placement. She is wheelchair-bound.  She is managed with furosemide and metolazone.  She is pacer dependent.   She had a pacer check earlier today.    Denies : Chest pain. Dizziness. Nitroglycerin use. Orthopnea. Palpitations. Paroxysmal nocturnal dyspnea. Shortness of breath. Syncope.   She sleeps well.  She is wheelchair bound. She reports some leg numbness.    Past Medical History:  Diagnosis Date  . Arthritis    "qwhere"  . AV block     2:1  January 14, 2011 / pacemaker plan when cellulitis resolved in length  . Bradycardia    2:1  AV block  . Cellulitis    Superficial cellulitis right lower leg   January 14, 2011  . Chronic diastolic CHF (congestive heart failure) (HCC)   . Diabetes mellitus without complication (HCC)   . Edema    . EF 65-70%, echo, November 29, 2010  . Ejection fraction    EF 65%, echo, January, 2012  . Hypertension   . Hypokalemia    October, 2012  . Murmur    No significant valvular disease by echo, January, 2012  . Pacemaker    January, 2013 Medtronic Sensa  . Pulmonary hypertension (HCC)     46 mmHg... echo... January, 2012    Past Surgical History:  Procedure Laterality Date  . CHOLECYSTECTOMY    . HIP ARTHROPLASTY Right   . INSERT / REPLACE / REMOVE PACEMAKER  11/2011  . PARTIAL HYSTERECTOMY     vaginal  . PERMANENT PACEMAKER INSERTION N/A 12/09/2011   Procedure: PERMANENT PACEMAKER INSERTION;  Surgeon: Marinus Maw, MD;  Location: [facility] CATH LAB;   Service: Cardiovascular;  Laterality: N/A;     Current Outpatient Medications  Medication Sig Dispense Refill  . allopurinol (ZYLOPRIM) 100 MG tablet Take 200 mg by mouth daily.    . carvedilol (COREG) 6.25 MG tablet Take 6.25 mg by mouth 2 (two) times daily with a meal.    . cholecalciferol (VITAMIN D) 1000 UNITS tablet Take 1,000 Units by mouth daily.    . diphenhydrAMINE (BENADRYL) 25 mg capsule Take 1 capsule (25 mg total) by mouth every 6 (six) hours as needed for itching. 30 capsule 0  . furosemide (LASIX) 40 MG tablet Take 40 mg by mouth daily. Take one tab in the afternoon.    . gabapentin (NEURONTIN) 300 MG capsule Take 1 capsule (300 mg total) by mouth 3 (three) times daily. 60 capsule 0  . HYDROcodone-acetaminophen (NORCO) 10-325 MG tablet Take 1 tablet daily as needed by mouth.    [name] JANUVIA 25 MG tablet Take 25 mg daily by mouth.    . loratadine (CLARITIN) 10 MG tablet Take 10 mg by mouth daily.    . magnesium oxide (MAG-OX) 400 (241.3 MG) MG tablet TAKE ONE TABLET BY MOUTH ONE TIME DAILY 30 tablet 4  . Menthol, Topical Analgesic, (BIOFREEZE COLORLESS EX) Apply 1 application topically 2 (  two) times daily.    . metFORMIN (GLUCOPHAGE) 850 MG tablet Take 500 mg by mouth 2 (two) times daily.     . metolazone (ZAROXOLYN) 2.5 MG tablet Take 2.5 mg by mouth daily. TAKE EVERY OTHER DAY 30 MINUTES BEFORE LASIX    . Multiple Vitamins-Minerals (CERTA-VITE PO) Take 1 tablet by mouth daily.    . nitroGLYCERIN (NITROSTAT) 0.4 MG SL tablet Place 0.4 mg under the tongue every 5 (five) minutes as needed for chest pain (X 3 DOSES FOR CHEST PAIN).    [name] polyethylene glycol powder (GLYCOLAX/MIRALAX) powder Take 17 g by mouth daily.    . potassium chloride (KLOR-CON) 10 MEQ tablet Take 40 mEq by mouth daily.     No current facility-administered medications for this visit.    Allergies:   Aspirin, Tramadol, and Naproxen    Social History:  The patient  reports that she has never smoked. She has  never used smokeless tobacco. She reports that she does not drink alcohol or use drugs.   Family History:  The patient's family history includes Cancer in her son; Cardiomyopathy in her father; Coronary artery disease in her son; Dementia in her mother; Diabetes in her father; Heart failure in her father.    ROS:  Please see the history of present illness.   Otherwise, review of systems are positive for leg swelling.   All other systems are reviewed and negative.    PHYSICAL EXAM: VS:  BP (!) 134/58   Pulse 100   Ht 4\' 11"  (1.499 m)   Wt 201 lb (91.2 kg)   SpO2 94%   BMI 40.60 kg/m  , BMI Body mass index is 40.6 kg/m. GEN: Well nourished, well developed, in no acute distress  HEENT: normal  Neck: no JVD, carotid bruits, or masses Cardiac: RRR; no murmurs, rubs, or gallops,no edema  Respiratory:  clear to auscultation bilaterally, normal work of breathing GI: soft, nontender, nondistended, + BS MS: no deformity or atrophy  Skin: warm and dry, no rash Neuro:  Strength and sensation are intact Psych: euthymic mood, full affect   EKG:   The ekg ordered today demonstrates V pacing   Recent Labs: No results found for requested labs within last 8760 hours.   Lipid Panel No results found for: CHOL, TRIG, HDL, CHOLHDL, VLDL, LDLCALC, LDLDIRECT   Other studies Reviewed: Additional studies/ records that were reviewed today with results demonstrating: 2019 labs reviewed.   ASSESSMENT AND PLAN:  1. Chronic diastolic heart failure: Mild pedal edema.  Overall appears euvolemic.  Check labs to make sure that we are not dehydrating her with diuretics.  Will adjust metolazone and potassium based on results.  Currently taking bother everyday.  If dehydrated, will cut back to QOD and cut potassium in half as well.  2. HTN: The current medical regimen is effective;  continue present plan and medications. 3. Obesity: stable.  Activity limited  Avoid salt in diet.   4. Pacer: Follows with  Dr. Lovena Le.   Current medicines are reviewed at length with the patient today.  The patient concerns regarding her medicines were addressed.  The following changes have been made:  No change  Labs/ tests ordered today include:  No orders of the defined types were placed in this encounter.   Recommend 150 minutes/week of aerobic exercise Low fat, low carb, high fiber diet recommended  Disposition:   FU in 1 year   Signed, Larae Grooms, MD  11/20/2019 3:10 PM  [facility] [address], [place], [place]  [address] Phone: [phone]; Fax: [phone]

## 2019-11-20 ENCOUNTER — Encounter: Payer: Self-pay | Admitting: Interventional Cardiology

## 2019-11-20 ENCOUNTER — Other Ambulatory Visit: Payer: Self-pay

## 2019-11-20 ENCOUNTER — Ambulatory Visit (INDEPENDENT_AMBULATORY_CARE_PROVIDER_SITE_OTHER): Payer: Medicare Other | Admitting: Interventional Cardiology

## 2019-11-20 ENCOUNTER — Ambulatory Visit (INDEPENDENT_AMBULATORY_CARE_PROVIDER_SITE_OTHER): Payer: Medicare Other | Admitting: Physician Assistant

## 2019-11-20 VITALS — BP 134/58 | HR 100 | Ht 59.0 in | Wt 201.0 lb

## 2019-11-20 DIAGNOSIS — I5032 Chronic diastolic (congestive) heart failure: Secondary | ICD-10-CM

## 2019-11-20 DIAGNOSIS — Z950 Presence of cardiac pacemaker: Secondary | ICD-10-CM | POA: Diagnosis not present

## 2019-11-20 DIAGNOSIS — I10 Essential (primary) hypertension: Secondary | ICD-10-CM | POA: Diagnosis not present

## 2019-11-20 NOTE — Patient Instructions (Signed)
Medication Instructions:   Your physician recommends that you continue on your current medications as directed. Please refer to the Current Medication list given to you today.  *If you need a refill on your cardiac medications before your next appointment, please call your pharmacy*  Lab Work: NONE ORDERED  TODAY   If you have labs (blood work) drawn today and your tests are completely normal, you will receive your results only by: . MyChart Message (if you have MyChart) OR . A paper copy in the mail If you have any lab test that is abnormal or we need to change your treatment, we will call you to review the results.  Testing/Procedures: NONE ORDERED  TODAY   Follow-Up: At [facility], you and your health needs are our priority.  As part of our continuing mission to provide you with exceptional heart care, we have created designated Provider Care Teams.  These Care Teams include your primary Cardiologist (physician) and Advanced Practice Providers (APPs -  Physician Assistants and Nurse Practitioners) who all work together to provide you with the care you need, when you need it.  Your next appointment:   1 year(s)  The format for your next appointment:   In Person  Provider:   You may see Dr. Taylor  or one of the following Advanced Practice Providers on your designated Care Team:    Amber Seiler, NP  Renee Ursuy, PA-C  Michael "Andy" Tillery, PA-C   Other Instructions   

## 2019-11-20 NOTE — Patient Instructions (Signed)
Medication Instructions:  Your physician recommends that you continue on your current medications as directed. Please refer to the Current Medication list given to you today.  *If you need a refill on your cardiac medications before your next appointment, please call your pharmacy*  Lab Work: TODAY: CMET, CBC  If you have labs (blood work) drawn today and your tests are completely normal, you will receive your results only by: [name] MyChart Message (if you have MyChart) OR . A paper copy in the mail If you have any lab test that is abnormal or we need to change your treatment, we will call you to review the results.  Testing/Procedures: None ordered  Follow-Up: At [facility], you and your health needs are our priority.  As part of our continuing mission to provide you with exceptional heart care, we have created designated Provider Care Teams.  These Care Teams include your primary Cardiologist (physician) and Advanced Practice Providers (APPs -  Physician Assistants and Nurse Practitioners) who all work together to provide you with the care you need, when you need it.  Your next appointment:   12 month(s)  The format for your next appointment:   In Person  Provider:   You may see Larae Grooms, MD or one of the following Advanced Practice Providers on your designated Care Team:    Melina Copa, PA-C  Ermalinda Barrios, PA-C   Other Instructions

## 2019-11-21 LAB — CBC
Hematocrit: 38.8 % (ref 34.0–46.6)
Hemoglobin: 12.9 g/dL (ref 11.1–15.9)
MCH: 30.2 pg (ref 26.6–33.0)
MCHC: 33.2 g/dL (ref 31.5–35.7)
MCV: 91 fL (ref 79–97)
Platelets: 298 10*3/uL (ref 150–450)
RBC: 4.27 x10E6/uL (ref 3.77–5.28)
RDW: 14.5 % (ref 11.7–15.4)
WBC: 8.3 10*3/uL (ref 3.4–10.8)

## 2019-11-21 LAB — BASIC METABOLIC PANEL
BUN/Creatinine Ratio: 20 (ref 12–28)
BUN: 18 mg/dL (ref 8–27)
CO2: 27 mmol/L (ref 20–29)
Calcium: 10.3 mg/dL (ref 8.7–10.3)
Chloride: 95 mmol/L — ABNORMAL LOW (ref 96–106)
Creatinine, Ser: 0.88 mg/dL (ref 0.57–1.00)
GFR calc Af Amer: 69 mL/min/{1.73_m2} (ref >59)
GFR calc non Af Amer: 60 mL/min/{1.73_m2} (ref >59)
Glucose: 101 mg/dL — ABNORMAL HIGH (ref 65–99)
Potassium: 4.7 mmol/L (ref 3.5–5.2)
Sodium: 137 mmol/L (ref 134–144)

## 2020-05-12 ENCOUNTER — Telehealth: Payer: Self-pay | Admitting: Internal Medicine

## 2020-05-12 NOTE — Telephone Encounter (Signed)
Pt is in wheelchair and will need family member to push her.  Updated appt notes.

## 2020-05-12 NOTE — Telephone Encounter (Signed)
Patient's daughter is requesting for patient's son, [name], to accompany her during [facility] appointment scheduled for 05/14/20 with device. She states the patient requires assistance because she is coming from a nursing home. Please advise.

## 2020-05-14 ENCOUNTER — Other Ambulatory Visit: Payer: Self-pay

## 2020-05-14 ENCOUNTER — Ambulatory Visit (INDEPENDENT_AMBULATORY_CARE_PROVIDER_SITE_OTHER): Payer: Medicare Other | Admitting: Emergency Medicine

## 2020-05-14 DIAGNOSIS — Z950 Presence of cardiac pacemaker: Secondary | ICD-10-CM | POA: Diagnosis not present

## 2020-05-14 DIAGNOSIS — I4430 Unspecified atrioventricular block: Secondary | ICD-10-CM | POA: Diagnosis not present

## 2020-05-27 LAB — CUP PACEART INCLINIC DEVICE CHECK
Battery Impedance: 2764 Ohm
Battery Remaining Longevity: 20 mo
Battery Voltage: 2.72 V
Brady Statistic AP VP Percent: 0 %
Brady Statistic AP VS Percent: 0 %
Brady Statistic AS VP Percent: 100 %
Brady Statistic AS VS Percent: 0 %
Date Time Interrogation Session: 20210624140300
Implantable Lead Implant Date: 20130118
Implantable Lead Implant Date: 20130118
Implantable Lead Location: 753859
Implantable Lead Location: 753860
Implantable Lead Model: 5076
Implantable Lead Model: 5076
Implantable Pulse Generator Implant Date: 20130118
Lead Channel Impedance Value: 394 Ohm
Lead Channel Impedance Value: 526 Ohm
Lead Channel Pacing Threshold Amplitude: 0.875 V
Lead Channel Pacing Threshold Amplitude: 0.875 V
Lead Channel Pacing Threshold Pulse Width: 0.4 ms
Lead Channel Pacing Threshold Pulse Width: 0.4 ms
Lead Channel Sensing Intrinsic Amplitude: 1.4 mV
Lead Channel Setting Pacing Amplitude: 2 V
Lead Channel Setting Pacing Amplitude: 2.5 V
Lead Channel Setting Pacing Pulse Width: 0.4 ms
Lead Channel Setting Sensing Sensitivity: 4 mV

## 2020-05-27 NOTE — Progress Notes (Signed)
Pacemaker check in clinic. Normal device function. Thresholds, sensing, impedances consistent with previous measurements. Device programmed to maximize longevity. No mode switch. 1high ventricular rate noted with EGM that showed NSVT 7 beats in duration. Device programmed at appropriate safety margins. Histogram distribution appropriate for patient activity level. Device programmed to optimize intrinsic conduction. Estimated longevity . Patient not enrolled in remote follow up, declines remote transmissions. Next in clinic check in 6 months on 11/10/20. Patient education completed.

## 2020-08-11 ENCOUNTER — Telehealth: Payer: Self-pay | Admitting: Interventional Cardiology

## 2020-08-11 NOTE — Telephone Encounter (Signed)
Called and spoke to patient and her daughter. She states that the patient had an episode at the nursing home the other night where she had to be put on O2 during the night. She states that she is not on it anymore. She denies SOB, swelling, weight gain, or any other Sx. She is taking her meds as prescribed. She will let u know if anything changes.

## 2020-08-11 NOTE — Telephone Encounter (Signed)
Her daughter is calling wanting to know what can be causing for her mother's oxygen level to keep dropping, at the nursing home they are giving her O2.   She just wants to make sure nothing is going on with her heart.

## 2020-08-14 ENCOUNTER — Telehealth: Payer: Self-pay | Admitting: Interventional Cardiology

## 2020-08-14 NOTE — Telephone Encounter (Signed)
New messge:     Patient daughter calling to see if it is ok to crush one of her medication.

## 2020-08-14 NOTE — Telephone Encounter (Signed)
Called and spoke to patient's daughter she is concerned the the patient's meds are being crushed at [facility]. She states that the patient is able to swallow her meds.   Called and left a message for the nursing supervisor at the facility to call me back.

## 2020-08-17 NOTE — Telephone Encounter (Signed)
Called and spoke to [place], the DON at [facility]. Made her aware that the patient and her daughter called to report that the patient's meds were being crushed. Made her aware that the patient's potassium should not be crushed. She states that the potassium is not being crushed. She states that some of the patient's meds were being crushed because the patient had been hoarding her medication.

## 2020-11-23 ENCOUNTER — Ambulatory Visit: Payer: Medicare Other | Admitting: Interventional Cardiology

## 2020-11-30 ENCOUNTER — Encounter (HOSPITAL_COMMUNITY): Payer: Self-pay

## 2020-11-30 ENCOUNTER — Emergency Department (HOSPITAL_COMMUNITY)
Admission: EM | Admit: 2020-11-30 | Discharge: 2020-11-30 | Disposition: A | Discharge status: Home / Self Care | Payer: Medicare Other | Attending: Emergency Medicine | Admitting: Emergency Medicine

## 2020-11-30 ENCOUNTER — Other Ambulatory Visit: Payer: Self-pay

## 2020-11-30 ENCOUNTER — Emergency Department (HOSPITAL_COMMUNITY): Payer: Medicare Other

## 2020-11-30 DIAGNOSIS — Z79899 Other long term (current) drug therapy: Secondary | ICD-10-CM | POA: Insufficient documentation

## 2020-11-30 DIAGNOSIS — Z7984 Long term (current) use of oral hypoglycemic drugs: Secondary | ICD-10-CM | POA: Insufficient documentation

## 2020-11-30 DIAGNOSIS — R600 Localized edema: Secondary | ICD-10-CM | POA: Diagnosis not present

## 2020-11-30 DIAGNOSIS — Z950 Presence of cardiac pacemaker: Secondary | ICD-10-CM | POA: Insufficient documentation

## 2020-11-30 DIAGNOSIS — E11649 Type 2 diabetes mellitus with hypoglycemia without coma: Secondary | ICD-10-CM | POA: Diagnosis not present

## 2020-11-30 DIAGNOSIS — I110 Hypertensive heart disease with heart failure: Secondary | ICD-10-CM | POA: Diagnosis not present

## 2020-11-30 DIAGNOSIS — Z20822 Contact with and (suspected) exposure to covid-19: Secondary | ICD-10-CM | POA: Diagnosis not present

## 2020-11-30 DIAGNOSIS — I5033 Acute on chronic diastolic (congestive) heart failure: Secondary | ICD-10-CM | POA: Insufficient documentation

## 2020-11-30 DIAGNOSIS — R410 Disorientation, unspecified: Secondary | ICD-10-CM | POA: Diagnosis not present

## 2020-11-30 DIAGNOSIS — Z96641 Presence of right artificial hip joint: Secondary | ICD-10-CM | POA: Diagnosis not present

## 2020-11-30 DIAGNOSIS — R197 Diarrhea, unspecified: Secondary | ICD-10-CM | POA: Diagnosis not present

## 2020-11-30 LAB — RESP PANEL BY RT-PCR (FLU A&B, COVID) ARPGX2
Influenza A by PCR: NEGATIVE
Influenza B by PCR: NEGATIVE
SARS Coronavirus 2 by RT PCR: NEGATIVE

## 2020-11-30 LAB — COMPREHENSIVE METABOLIC PANEL
ALT: 11 U/L (ref 0–44)
AST: 24 U/L (ref 15–41)
Albumin: 4.1 g/dL (ref 3.5–5.0)
Alkaline Phosphatase: 81 U/L (ref 38–126)
Anion gap: 12 (ref 5–15)
BUN: 33 mg/dL — ABNORMAL HIGH (ref 8–23)
CO2: 29 mmol/L (ref 22–32)
Calcium: 10.3 mg/dL (ref 8.9–10.3)
Chloride: 96 mmol/L — ABNORMAL LOW (ref 98–111)
Creatinine, Ser: 1.47 mg/dL — ABNORMAL HIGH (ref 0.44–1.00)
GFR, Estimated: 34 mL/min — ABNORMAL LOW (ref >60)
Glucose, Bld: 108 mg/dL — ABNORMAL HIGH (ref 70–99)
Potassium: 4 mmol/L (ref 3.5–5.1)
Sodium: 137 mmol/L (ref 135–145)
Total Bilirubin: 0.6 mg/dL (ref 0.3–1.2)
Total Protein: 8.4 g/dL — ABNORMAL HIGH (ref 6.5–8.1)

## 2020-11-30 LAB — CBC
HCT: 40.5 % (ref 36.0–46.0)
Hemoglobin: 13.1 g/dL (ref 12.0–15.0)
MCH: 31 pg (ref 26.0–34.0)
MCHC: 32.3 g/dL (ref 30.0–36.0)
MCV: 95.7 fL (ref 80.0–100.0)
Platelets: 296 10*3/uL (ref 150–400)
RBC: 4.23 MIL/uL (ref 3.87–5.11)
RDW: 14.7 % (ref 11.5–15.5)
WBC: 8.4 10*3/uL (ref 4.0–10.5)
nRBC: 0 % (ref 0.0–0.2)

## 2020-11-30 LAB — URINALYSIS, ROUTINE W REFLEX MICROSCOPIC
Bilirubin Urine: NEGATIVE
Glucose, UA: NEGATIVE mg/dL
Hgb urine dipstick: NEGATIVE
Ketones, ur: NEGATIVE mg/dL
Leukocytes,Ua: NEGATIVE
Nitrite: NEGATIVE
Protein, ur: NEGATIVE mg/dL
Specific Gravity, Urine: 1.008 (ref 1.005–1.030)
pH: 8 (ref 5.0–8.0)

## 2020-11-30 LAB — CBG MONITORING, ED: Glucose-Capillary: 80 mg/dL (ref 70–99)

## 2020-11-30 NOTE — ED Triage Notes (Signed)
Pt brought to ED via [facility] for increased confusion since this weekend. States she was at [facility] and started on ABX and confusion got worse since. Pt from [facility]

## 2020-11-30 NOTE — ED Notes (Signed)
Attempted to call report to [facility] x [facility]

## 2020-11-30 NOTE — ED Provider Notes (Signed)
[facility] EMERGENCY DEPARTMENT Provider Note   CSN: [id number] Arrival date & time: 11/30/20  1418     History Chief Complaint  Patient presents with   Altered Mental Status    [name] is a 87 y.o. female past medical history of CHF, type 2 diabetes, hypertension, pulmonary hypertension, presenting to the ED from [facility] for confusion.  Patient reports she had an x-ray done on Monday of last week and was found to have pneumonia.  She started antibiotics on Tuesday that was not sure of the name.  She denies any cough or fevers.  She does not feel unwell.  She endorses some diarrhea started sometime after beginning antibiotics. She also had decreased appetite last night.  Her son is at bedside, states he has not been hearing her cough though she has been spitting up some mucus.  He states she is somewhat more confused than her baseline though normally has some intermittent episodes of confusion.  He states the facility said she was "talking out of her head" over the weekend.  Patient states they tested her for COVID last week and was negative.  Per [facility] and paperwork at bedside from facility, patient was given Levaquin 750 mg x 7 days.  The history is provided by the patient, a relative and medical records.       Past Medical History:  Diagnosis Date   Arthritis    "qwhere"   AV block     2:1  January 14, 2011 / pacemaker plan when cellulitis resolved in length   Bradycardia    2:1  AV block   Cellulitis    Superficial cellulitis right lower leg   January 14, 2011   Chronic diastolic CHF (congestive heart failure) (HCC)    Diabetes mellitus without complication (HCC)    Edema    . EF 65-70%, echo, November 29, 2010   Ejection fraction    EF 65%, echo, January, 2012   Hypertension    Hypokalemia    October, 2012   Murmur    No significant valvular disease by echo, January, 2012   Pacemaker    January, 2013 Medtronic Sensa   Pulmonary  hypertension (HCC)     46 mmHg... echo... January, 2012    Patient Active Problem List   Diagnosis Date Noted   Acute on chronic diastolic CHF (congestive heart failure) (HCC) 11/24/2016   Acute encephalopathy 12/31/2015   Hypoglycemia 12/31/2015   Chest pain 08/12/2015   Chronic edema 04/30/2015   Elevated troponin 04/30/2015   Peripheral edema 04/30/2015   Nocturnal leg cramps 04/06/2015   Pressure in chest 01/13/2015   Generalized weakness 08/06/2014   Protein-calorie malnutrition, moderate (HCC) 08/06/2014   Osteoarthritis 04/29/2014   Anxiety 04/28/2014   Neck pain 04/28/2014   Weakness 04/21/2014   FTT (failure to thrive) in adult 04/21/2014   Chronic diastolic CHF (congestive heart failure) (HCC)    Pacemaker    Hypertension 12/10/2011   Cardiac pacemaker 12/10/2011   Murmur    Hypokalemia    Ejection fraction    Edema    AV block    Cellulitis    Pulmonary hypertension (HCC)     Past Surgical History:  Procedure Laterality Date   CHOLECYSTECTOMY     HIP ARTHROPLASTY Right    INSERT / REPLACE / REMOVE PACEMAKER  11/2011   PARTIAL HYSTERECTOMY     vaginal   PERMANENT PACEMAKER INSERTION N/A 12/09/2011   Procedure: PERMANENT PACEMAKER INSERTION;  Surgeon:  Marinus Maw, MD;  Location: [facility] CATH LAB;  Service: Cardiovascular;  Laterality: N/A;     OB History   No obstetric history on file.     Family History  Problem Relation Age of Onset   Cardiomyopathy Father    Diabetes Father    Heart failure Father    Dementia Mother    Cancer Son    Coronary artery disease Son    Heart attack Neg Hx     Social History   Tobacco Use   Smoking status: Never Smoker   Smokeless tobacco: Never Used  Vaping Use   Vaping Use: Never used  Substance Use Topics   Alcohol use: No   Drug use: No    Home Medications Prior to Admission medications   Medication Sig Start Date End Date Taking? Authorizing Provider   allopurinol (ZYLOPRIM) 100 MG tablet Take 200 mg by mouth daily.    [provider]  carvedilol (COREG) 6.25 MG tablet Take 6.25 mg by mouth 2 (two) times daily with a meal.    [provider]  cholecalciferol (VITAMIN D) 1000 UNITS tablet Take 1,000 Units by mouth daily.    [provider]  diphenhydrAMINE (BENADRYL) 25 mg capsule Take 1 capsule (25 mg total) by mouth every 6 (six) hours as needed for itching. 08/21/17   Burgess Amor, PA-C  furosemide (LASIX) 40 MG tablet Take 40 mg by mouth daily. Take one tab in the afternoon. 09/19/17   [provider]  gabapentin (NEURONTIN) 300 MG capsule Take 1 capsule (300 mg total) by mouth 3 (three) times daily. 05/09/15   Hedges, Tinnie Gens, PA-C  HYDROcodone-acetaminophen (NORCO) 10-325 MG tablet Take 1 tablet daily as needed by mouth. 09/28/17   [provider]  JANUVIA 25 MG tablet Take 25 mg daily by mouth. 09/25/17   [provider]  loratadine (CLARITIN) 10 MG tablet Take 10 mg by mouth daily.    [provider]  magnesium oxide (MAG-OX) 400 (241.3 MG) MG tablet TAKE ONE TABLET BY MOUTH ONE TIME DAILY 04/02/15   Luis Abed, MD  Menthol, Topical Analgesic, (BIOFREEZE COLORLESS EX) Apply 1 application topically 2 (two) times daily.    [provider]  metFORMIN (GLUCOPHAGE) 850 MG tablet Take 500 mg by mouth 2 (two) times daily.  08/25/17   [provider]  metolazone (ZAROXOLYN) 2.5 MG tablet Take 2.5 mg by mouth daily. TAKE EVERY OTHER DAY 30 MINUTES BEFORE LASIX    [provider]  Multiple Vitamins-Minerals (CERTA-VITE PO) Take 1 tablet by mouth daily.    [provider]  nitroGLYCERIN (NITROSTAT) 0.4 MG SL tablet Place 0.4 mg under the tongue every 5 (five) minutes as needed for chest pain (X 3 DOSES FOR CHEST PAIN).    [provider]  polyethylene glycol powder (GLYCOLAX/MIRALAX) powder Take 17 g by mouth daily.    [provider]   potassium chloride (KLOR-CON) 10 MEQ tablet Take 40 mEq by mouth daily. 11/19/19   [provider]    Allergies    Aspirin, Tramadol, and Naproxen  Review of Systems   Review of Systems  Psychiatric/Behavioral: Positive for confusion.  All other systems reviewed and are negative.   Physical Exam Updated Vital Signs BP 120/87 (BP Location: Right Arm)    Temp 98.6 F (37 C) (Oral)    Resp (!) 68    Ht 5' (1.524 m)    Wt 86.2 kg    SpO2 96%  BMI 37.11 kg/m   Physical Exam Vitals and nursing note reviewed.  Constitutional:      General: She is not in acute distress.    Appearance: She is well-developed and well-nourished.  HENT:     Head: Normocephalic and atraumatic.  Eyes:     Conjunctiva/sclera: Conjunctivae normal.  Cardiovascular:     Rate and Rhythm: Normal rate and regular rhythm.  Pulmonary:     Effort: Pulmonary effort is normal.     Breath sounds: Rales (b/l bases R>L) present.  Abdominal:     General: Bowel sounds are normal.     Palpations: Abdomen is soft.     Tenderness: There is no abdominal tenderness. There is no guarding or rebound.  Musculoskeletal:     Right lower leg: Edema (trace) present.     Left lower leg: Edema (trace) present.  Skin:    General: Skin is warm.  Neurological:     Mental Status: She is alert.     Comments: Patient is able to provided adequate history. She is oriented to person, place, month and day of the week. Is not oriented to year. Resting tremor noted to upper extremities  Psychiatric:        Mood and Affect: Mood and affect normal.        Behavior: Behavior normal.     ED Results / Procedures / Treatments   Labs (all labs ordered are listed, but only abnormal results are displayed) Labs Reviewed  COMPREHENSIVE METABOLIC PANEL - Abnormal; Notable for the following components:      Result Value   Chloride 96 (*)    Glucose, Bld 108 (*)    BUN 33 (*)    Creatinine, Ser 1.47 (*)    Total Protein 8.4 (*)     GFR, Estimated 34 (*)    All other components within normal limits  URINALYSIS, ROUTINE W REFLEX MICROSCOPIC - Abnormal; Notable for the following components:   Color, Urine STRAW (*)    All other components within normal limits  RESP PANEL BY RT-PCR (FLU A&B, COVID) [id number]  URINE CULTURE  CBC  CBG MONITORING, ED    EKG EKG Interpretation  Date/Time:  Monday November 30 2020 15:53:07 EST Ventricular Rate:  101 PR Interval:    QRS Duration: 145 QT Interval:  390 QTC Calculation: 506 R Axis:   -81 Text Interpretation: Atrial-sensed ventricular-paced rhythm Confirmed by Jacalyn Lefevre [phone]) on 11/30/2020 6:51:29 PM   Radiology DG Chest Port 1 View  Result Date: 11/30/2020 CLINICAL DATA:  Altered mental status.  Diabetes.  CHF. EXAM: PORTABLE CHEST 1 VIEW COMPARISON:  08/21/2017 FINDINGS: The Chin overlies the apices. Pacer with leads at right atrium and right ventricle. No lead discontinuity. Moderate to marked right hemidiaphragm elevation. Normal heart size. Tortuous thoracic aorta. Atherosclerosis in the transverse aorta. No pleural effusion or pneumothorax. Clear lungs. IMPRESSION: No acute cardiopulmonary disease. Aortic Atherosclerosis (ICD10-I70.0). Electronically Signed   By: Jeronimo Greaves M.D.   On: 11/30/2020 15:13    Procedures Procedures (including critical care time)  Medications Ordered in ED Medications - No data to display  ED Course  I have reviewed the triage vital signs and the nursing notes.  Pertinent labs & imaging results that were available during my care of the patient were reviewed by me and considered in my medical decision making (see chart for details).    MDM Rules/Calculators/A&P  Patient brought in from assisted living facility, [facility], for increased intermittent episodes of confusion from baseline.  She was reportedly being treated for community-acquired pneumonia last week on Levaquin.  She states she does  not feel ill, she has not been coughing much though does have some sputum production.  Her son is at bedside on evaluation, states she may not be eating and drinking as much as usual.  States she is at her baseline on evaluation.  Patient is oriented to person, place, month and day of the week, Is not oriented to year though this is not abnormal per son.  She is in no acute distress on evaluation.  She has afebrile with stable vital signs.   Blood work reveals normal white count, mild AKI with creatinine 1.47, last comparison is 1 year ago at point .88.  Chest x-ray today is negative for infiltrate.  Of note, there is an interpretation for chest x-ray noted to have a lobar pneumonia last week.   There is some delay in obtaining UA, however in and out cath was done and UA is negative for infection.   On reevaluation, she remains at mental baseline.  She is somewhat upset due to a Band-Aid that was placed tightly on her finger after CABG.  This is removed and she had significant relief in symptoms, no active bleeding was noted to the digit.    Patient has remained at her mental baseline throughout ED stay and is in no acute distress.  Patient is appropriate for discharge back to her nursing facility.  Final Clinical Impression(s) / ED Diagnoses Final diagnoses:  Confusion    Rx / DC Orders ED Discharge Orders    None       Kimberl Vig, [place] N, PA-C 11/30/20 2048    Jacalyn Lefevre, MD 12/01/20 [phone]

## 2020-11-30 NOTE — Discharge Instructions (Addendum)
Her chest xray is clear.  Her urine shows no signs of infection. She needs to have her kidney function rechecked in about 3 days, as her creatinine is slightly elevated today to 1.47. She needs to stay hydrated. Follow closely with her primary care provider. Return to the ER for worsening symptoms.

## 2020-12-01 ENCOUNTER — Encounter: Payer: Medicare Other | Admitting: Internal Medicine

## 2020-12-01 ENCOUNTER — Emergency Department (HOSPITAL_COMMUNITY)
Admission: EM | Admit: 2020-12-01 | Discharge: 2020-12-01 | Disposition: A | Discharge status: Home / Self Care | Payer: Medicare Other | Attending: Emergency Medicine | Admitting: Emergency Medicine

## 2020-12-01 ENCOUNTER — Emergency Department (HOSPITAL_COMMUNITY): Payer: Medicare Other

## 2020-12-01 ENCOUNTER — Encounter (HOSPITAL_COMMUNITY): Payer: Self-pay | Admitting: Emergency Medicine

## 2020-12-01 ENCOUNTER — Other Ambulatory Visit: Payer: Self-pay

## 2020-12-01 DIAGNOSIS — Z7984 Long term (current) use of oral hypoglycemic drugs: Secondary | ICD-10-CM | POA: Insufficient documentation

## 2020-12-01 DIAGNOSIS — I5033 Acute on chronic diastolic (congestive) heart failure: Secondary | ICD-10-CM | POA: Diagnosis not present

## 2020-12-01 DIAGNOSIS — F419 Anxiety disorder, unspecified: Secondary | ICD-10-CM | POA: Diagnosis not present

## 2020-12-01 DIAGNOSIS — Z96641 Presence of right artificial hip joint: Secondary | ICD-10-CM | POA: Diagnosis not present

## 2020-12-01 DIAGNOSIS — R4182 Altered mental status, unspecified: Secondary | ICD-10-CM | POA: Diagnosis present

## 2020-12-01 DIAGNOSIS — Z20822 Contact with and (suspected) exposure to covid-19: Secondary | ICD-10-CM | POA: Diagnosis not present

## 2020-12-01 DIAGNOSIS — I110 Hypertensive heart disease with heart failure: Secondary | ICD-10-CM | POA: Insufficient documentation

## 2020-12-01 DIAGNOSIS — E119 Type 2 diabetes mellitus without complications: Secondary | ICD-10-CM | POA: Insufficient documentation

## 2020-12-01 DIAGNOSIS — Z79899 Other long term (current) drug therapy: Secondary | ICD-10-CM | POA: Insufficient documentation

## 2020-12-01 DIAGNOSIS — Z950 Presence of cardiac pacemaker: Secondary | ICD-10-CM | POA: Diagnosis not present

## 2020-12-01 LAB — COMPREHENSIVE METABOLIC PANEL
ALT: 10 U/L (ref 0–44)
AST: 26 U/L (ref 15–41)
Albumin: 4.4 g/dL (ref 3.5–5.0)
Alkaline Phosphatase: 81 U/L (ref 38–126)
Anion gap: 15 (ref 5–15)
BUN: 31 mg/dL — ABNORMAL HIGH (ref 8–23)
CO2: 26 mmol/L (ref 22–32)
Calcium: 10.6 mg/dL — ABNORMAL HIGH (ref 8.9–10.3)
Chloride: 98 mmol/L (ref 98–111)
Creatinine, Ser: 1.45 mg/dL — ABNORMAL HIGH (ref 0.44–1.00)
GFR, Estimated: 35 mL/min — ABNORMAL LOW (ref >60)
Glucose, Bld: 87 mg/dL (ref 70–99)
Potassium: 3.7 mmol/L (ref 3.5–5.1)
Sodium: 139 mmol/L (ref 135–145)
Total Bilirubin: 0.9 mg/dL (ref 0.3–1.2)
Total Protein: 8.7 g/dL — ABNORMAL HIGH (ref 6.5–8.1)

## 2020-12-01 LAB — CBC WITH DIFFERENTIAL/PLATELET
Abs Immature Granulocytes: 0.03 10*3/uL (ref 0.00–0.07)
Basophils Absolute: 0.1 10*3/uL (ref 0.0–0.1)
Basophils Relative: 1 %
Eosinophils Absolute: 0.2 10*3/uL (ref 0.0–0.5)
Eosinophils Relative: 3 %
HCT: 43.4 % (ref 36.0–46.0)
Hemoglobin: 14.1 g/dL (ref 12.0–15.0)
Immature Granulocytes: 0 %
Lymphocytes Relative: 19 %
Lymphs Abs: 1.7 10*3/uL (ref 0.7–4.0)
MCH: 30.8 pg (ref 26.0–34.0)
MCHC: 32.5 g/dL (ref 30.0–36.0)
MCV: 94.8 fL (ref 80.0–100.0)
Monocytes Absolute: 0.7 10*3/uL (ref 0.1–1.0)
Monocytes Relative: 8 %
Neutro Abs: 5.9 10*3/uL (ref 1.7–7.7)
Neutrophils Relative %: 69 %
Platelets: 291 10*3/uL (ref 150–400)
RBC: 4.58 MIL/uL (ref 3.87–5.11)
RDW: 14.6 % (ref 11.5–15.5)
WBC: 8.6 10*3/uL (ref 4.0–10.5)
nRBC: 0 % (ref 0.0–0.2)

## 2020-12-01 LAB — LACTIC ACID, PLASMA: Lactic Acid, Venous: 2.3 mmol/L (ref 0.5–1.9)

## 2020-12-01 LAB — POC SARS CORONAVIRUS 2 AG -  ED: SARS Coronavirus 2 Ag: NEGATIVE

## 2020-12-01 MED ORDER — HYDROXYZINE HCL 25 MG PO TABS: 25.0000 mg | ORAL_TABLET | Freq: Three times a day (TID) | ORAL | 0 refills | Status: DC | PRN

## 2020-12-01 NOTE — ED Notes (Signed)
Date and time results received: 12/01/20 1307 (use smartphrase ".now" to insert current time)  Test: Lactic Acid Critical Value: 2.3  Name of Provider Notified: [place]  Orders Received? Or Actions Taken?:

## 2020-12-01 NOTE — Discharge Instructions (Addendum)
Follow-up with your doctor next week for recheck 

## 2020-12-01 NOTE — ED Triage Notes (Signed)
Pt from arrives from [facility] via [employer]. Pt unsure of why she was sent to the ER when asked. Per facility pt is confused and not at her baseline. Pt denies pain.

## 2020-12-01 NOTE — ED Notes (Signed)
Attempted to call report to [facility]. Transferred to voicemail. Will continue to attempt report

## 2020-12-01 NOTE — ED Notes (Signed)
Meal tray given 

## 2020-12-01 NOTE — ED Provider Notes (Signed)
[facility] EMERGENCY DEPARTMENT Provider Note   CSN: [id number] Arrival date & time: 12/01/20  1128     History Chief Complaint  Patient presents with  . Altered Mental Status    [name] is a 87 y.o. female.  Patient brought back to the emergency department because she is more confused than normal.  She was seen yesterday and had a full evaluation.  The history is provided by the patient and a relative. No language interpreter was used.  Altered Mental Status Presenting symptoms: behavior changes   Severity:  Mild Most recent episode:  Today Episode history:  Multiple Timing:  Unable to specify Progression:  Resolved Chronicity:  Recurrent      Past Medical History:  Diagnosis Date  . Arthritis    "qwhere"  . AV block     2:1  January 14, 2011 / pacemaker plan when cellulitis resolved in length  . Bradycardia    2:1  AV block  . Cellulitis    Superficial cellulitis right lower leg   January 14, 2011  . Chronic diastolic CHF (congestive heart failure) (HCC)   . Diabetes mellitus without complication (HCC)   . Edema    . EF 65-70%, echo, November 29, 2010  . Ejection fraction    EF 65%, echo, January, 2012  . Hypertension   . Hypokalemia    October, 2012  . Murmur    No significant valvular disease by echo, January, 2012  . Pacemaker    January, 2013 Medtronic Sensa  . Pulmonary hypertension (HCC)     46 mmHg... echo... January, 2012    Patient Active Problem List   Diagnosis Date Noted  . Acute on chronic diastolic CHF (congestive heart failure) (HCC) 11/24/2016  . Acute encephalopathy 12/31/2015  . Hypoglycemia 12/31/2015  . Chest pain 08/12/2015  . Chronic edema 04/30/2015  . Elevated troponin 04/30/2015  . Peripheral edema 04/30/2015  . Nocturnal leg cramps 04/06/2015  . Pressure in chest 01/13/2015  . Generalized weakness 08/06/2014  . Protein-calorie malnutrition, moderate (HCC) 08/06/2014  . Osteoarthritis 04/29/2014  . Anxiety  04/28/2014  . Neck pain 04/28/2014  . Weakness 04/21/2014  . FTT (failure to thrive) in adult 04/21/2014  . Chronic diastolic CHF (congestive heart failure) (HCC)   . Pacemaker   . Hypertension 12/10/2011  . Cardiac pacemaker 12/10/2011  . Murmur   . Hypokalemia   . Ejection fraction   . Edema   . AV block   . Cellulitis   . Pulmonary hypertension (HCC)     Past Surgical History:  Procedure Laterality Date  . CHOLECYSTECTOMY    . HIP ARTHROPLASTY Right   . INSERT / REPLACE / REMOVE PACEMAKER  11/2011  . PARTIAL HYSTERECTOMY     vaginal  . PERMANENT PACEMAKER INSERTION N/A 12/09/2011   Procedure: PERMANENT PACEMAKER INSERTION;  Surgeon: Marinus Maw, MD;  Location: [facility] CATH LAB;  Service: Cardiovascular;  Laterality: N/A;     OB History   No obstetric history on file.     Family History  Problem Relation Age of Onset  . Cardiomyopathy Father   . Diabetes Father   . Heart failure Father   . Dementia Mother   . Cancer Son   . Coronary artery disease Son   . Heart attack Neg Hx     Social History   Tobacco Use  . Smoking status: Never Smoker  . Smokeless tobacco: Never Used  Vaping Use  . Vaping Use: Never used  Substance Use Topics  . Alcohol use: No  . Drug use: No    Home Medications Prior to Admission medications   Medication Sig Start Date End Date Taking? Authorizing Provider  hydrOXYzine (ATARAX/VISTARIL) 25 MG tablet Take 1 tablet (25 mg total) by mouth every 8 (eight) hours as needed for anxiety. 12/01/20  Yes Bethann Berkshire, MD  allopurinol (ZYLOPRIM) 100 MG tablet Take 200 mg by mouth daily.    [provider]  carvedilol (COREG) 6.25 MG tablet Take 6.25 mg by mouth 2 (two) times daily with a meal.    [provider]  cholecalciferol (VITAMIN D) 1000 UNITS tablet Take 1,000 Units by mouth daily.    [provider]  diphenhydrAMINE (BENADRYL) 25 mg capsule Take 1 capsule (25 mg total) by mouth every 6 (six) hours as needed  for itching. 08/21/17   Burgess Amor, PA-C  furosemide (LASIX) 40 MG tablet Take 40 mg by mouth daily. Take one tab in the afternoon. 09/19/17   [provider]  gabapentin (NEURONTIN) 300 MG capsule Take 1 capsule (300 mg total) by mouth 3 (three) times daily. 05/09/15   Hedges, Tinnie Gens, PA-C  HYDROcodone-acetaminophen (NORCO) 10-325 MG tablet Take 1 tablet daily as needed by mouth. 09/28/17   [provider]  JANUVIA 25 MG tablet Take 25 mg daily by mouth. 09/25/17   [provider]  loratadine (CLARITIN) 10 MG tablet Take 10 mg by mouth daily.    [provider]  magnesium oxide (MAG-OX) 400 (241.3 MG) MG tablet TAKE ONE TABLET BY MOUTH ONE TIME DAILY 04/02/15   Luis Abed, MD  Menthol, Topical Analgesic, (BIOFREEZE COLORLESS EX) Apply 1 application topically 2 (two) times daily.    [provider]  metFORMIN (GLUCOPHAGE) 850 MG tablet Take 500 mg by mouth 2 (two) times daily.  08/25/17   [provider]  metolazone (ZAROXOLYN) 2.5 MG tablet Take 2.5 mg by mouth daily. TAKE EVERY OTHER DAY 30 MINUTES BEFORE LASIX    [provider]  Multiple Vitamins-Minerals (CERTA-VITE PO) Take 1 tablet by mouth daily.    [provider]  nitroGLYCERIN (NITROSTAT) 0.4 MG SL tablet Place 0.4 mg under the tongue every 5 (five) minutes as needed for chest pain (X 3 DOSES FOR CHEST PAIN).    [provider]  polyethylene glycol powder (GLYCOLAX/MIRALAX) powder Take 17 g by mouth daily.    [provider]  potassium chloride (KLOR-CON) 10 MEQ tablet Take 40 mEq by mouth daily. 11/19/19   [provider]    Allergies    Aspirin, Tramadol, and Naproxen  Review of Systems   Review of Systems  Unable to perform ROS: Mental status change    Physical Exam Updated Vital Signs BP 132/70   Pulse 91   Temp 99.6 F (37.6 C) (Rectal)   Resp 19   SpO2 99%   Physical Exam Vitals and nursing note reviewed.   Constitutional:      Appearance: She is well-developed.  HENT:     Head: Normocephalic.     Nose: Nose normal.  Eyes:     General: No scleral icterus.    Extraocular Movements: EOM normal.     Conjunctiva/sclera: Conjunctivae normal.  Neck:     Thyroid: No thyromegaly.  Cardiovascular:     Rate and Rhythm: Normal rate and regular rhythm.     Heart sounds: No murmur heard. No friction rub. No gallop.   Pulmonary:     Breath sounds: No  stridor. No wheezing or rales.  Chest:     Chest wall: No tenderness.  Abdominal:     General: There is no distension.     Tenderness: There is no abdominal tenderness. There is no rebound.  Musculoskeletal:        General: No edema. Normal range of motion.     Cervical back: Neck supple.  Lymphadenopathy:     Cervical: No cervical adenopathy.  Skin:    Findings: No erythema or rash.  Neurological:     Mental Status: She is alert.     Motor: No abnormal muscle tone.     Coordination: Coordination normal.     Comments: Oriented to person place only  Psychiatric:        Mood and Affect: Mood and affect normal.        Behavior: Behavior normal.     ED Results / Procedures / Treatments   Labs (all labs ordered are listed, but only abnormal results are displayed) Labs Reviewed  LACTIC ACID, PLASMA - Abnormal; Notable for the following components:      Result Value   Lactic Acid, Venous 2.3 (*)    All other components within normal limits  COMPREHENSIVE METABOLIC PANEL - Abnormal; Notable for the following components:   BUN 31 (*)    Creatinine, Ser 1.45 (*)    Calcium 10.6 (*)    Total Protein 8.7 (*)    GFR, Estimated 35 (*)    All other components within normal limits  CBC WITH DIFFERENTIAL/PLATELET  POC SARS CORONAVIRUS 2 AG -  ED    EKG None  Radiology CT Head Wo Contrast  Result Date: 12/01/2020 CLINICAL DATA:  Altered mental status. EXAM: CT HEAD WITHOUT CONTRAST TECHNIQUE: Contiguous axial images were obtained from the  base of the skull through the vertex without intravenous contrast. COMPARISON:  December 31, 2015. FINDINGS: Brain: Mild chronic ischemic white matter disease is noted. No mass effect or midline shift is noted. Ventricular size is within normal limits. There is no evidence of mass lesion, hemorrhage or acute infarction. Vascular: No hyperdense vessel or unexpected calcification. Skull: Normal. Negative for fracture or focal lesion. Sinuses/Orbits: No acute finding. Other: None. IMPRESSION: Mild chronic ischemic white matter disease. No acute intracranial abnormality seen. Electronically Signed   By: Lupita Raider M.D.   On: 12/01/2020 13:25   DG Chest Port 1 View  Result Date: 11/30/2020 CLINICAL DATA:  Altered mental status.  Diabetes.  CHF. EXAM: PORTABLE CHEST 1 VIEW COMPARISON:  08/21/2017 FINDINGS: The Chin overlies the apices. Pacer with leads at right atrium and right ventricle. No lead discontinuity. Moderate to marked right hemidiaphragm elevation. Normal heart size. Tortuous thoracic aorta. Atherosclerosis in the transverse aorta. No pleural effusion or pneumothorax. Clear lungs. IMPRESSION: No acute cardiopulmonary disease. Aortic Atherosclerosis (ICD10-I70.0). Electronically Signed   By: Jeronimo Greaves M.D.   On: 11/30/2020 15:13    Procedures Procedures (including critical care time)  Medications Ordered in ED Medications - No data to display  ED Course  I have reviewed the triage vital signs and the nursing notes.  Pertinent labs & imaging results that were available during my care of the patient were reviewed by me and considered in my medical decision making (see chart for details).    MDM Rules/Calculators/A&P                          CT head negative.  According to son  She is pretty much back to her normal.  Suspect anxiety and stress.  This patient had a work-up yesterday and today has normal labs and CT scan and she is back to normal she will be discharged home Final  Clinical Impression(s) / ED Diagnoses Final diagnoses:  Anxiety    Rx / DC Orders ED Discharge Orders         Ordered    hydrOXYzine (ATARAX/VISTARIL) 25 MG tablet  Every 8 hours PRN        12/01/20 1414           Bethann Berkshire, MD 12/05/20 [phone]

## 2020-12-02 LAB — URINE CULTURE: Culture: NO GROWTH

## 2020-12-09 ENCOUNTER — Telehealth: Payer: Self-pay | Admitting: Interventional Cardiology

## 2020-12-09 NOTE — Telephone Encounter (Signed)
I spoke with patient's daughter. She reports patient has been more confused recently.  Has not worsened since recent ED visits.  Daughter reports MD at patient's facility felt it could be related to medication she was given for recent pneumonia or for gout.  Daughter wanted to make Dr Eldridge Dace aware as she would not be at appointment on Monday with patient.  I told daughter notes from ED visits to [facility] were in chart.

## 2020-12-09 NOTE — Telephone Encounter (Signed)
New Message:      Daughter called and said pt have an appointment on Monday. She said she is very concerned about her. She feels something is going on with her, she seems so different and disoriented. Daughter will not be able to come with her for her  appt  On Monday, her brother will be coming. She just wanted the provider to know what is going on please.

## 2020-12-13 NOTE — Progress Notes (Signed)
Cardiology Office Note   Date:  12/14/2020   ID:  [name], DOB 13-Aug-1931, MRN [id number]  PCP:  System, Provider Not In    No chief complaint on file.  Chronic diastolic heart failure  Wt Readings from Last 3 Encounters:  11/30/20 190 lb (86.2 kg)  11/20/19 201 lb (91.2 kg)  11/20/19 201 lb (91.2 kg)       History of Present Illness: [name] is a 87 y.o. female  Who previously saw Dr. Myrtis [name].  She has chronic diastolic heart failure and permanent pacemaker placement. She is wheelchair-bound.  She is managed with furosemide and metolazone.  She is pacer dependent.   She has a pacer check later today.    She went to the ER in Jan 2022 for confusion.  THought to be dehydrated with a possible UTI.   Negative head CT.  She needs oxygen at times.   Denies : Chest pain. Dizziness. Leg edema. Nitroglycerin use. Orthopnea. Palpitations. Paroxysmal nocturnal dyspnea. Shortness of breath. Syncope.   Unclear etiology of confusion.     Past Medical History:  Diagnosis Date  . Arthritis    "qwhere"  . AV block     2:1  January 14, 2011 / pacemaker plan when cellulitis resolved in length  . Bradycardia    2:1  AV block  . Cellulitis    Superficial cellulitis right lower leg   January 14, 2011  . Chronic diastolic CHF (congestive heart failure) (HCC)   . Diabetes mellitus without complication (HCC)   . Edema    . EF 65-70%, echo, November 29, 2010  . Ejection fraction    EF 65%, echo, January, 2012  . Hypertension   . Hypokalemia    October, 2012  . Murmur    No significant valvular disease by echo, January, 2012  . Pacemaker    January, 2013 Medtronic Sensa  . Pulmonary hypertension (HCC)     46 mmHg... echo... January, 2012    Past Surgical History:  Procedure Laterality Date  . CHOLECYSTECTOMY    . HIP ARTHROPLASTY Right   . INSERT / REPLACE / REMOVE PACEMAKER  11/2011  . PARTIAL HYSTERECTOMY     vaginal  . PERMANENT PACEMAKER  INSERTION N/A 12/09/2011   Procedure: PERMANENT PACEMAKER INSERTION;  Surgeon: Marinus Maw, MD;  Location: [facility] CATH LAB;  Service: Cardiovascular;  Laterality: N/A;     Current Outpatient Medications  Medication Sig Dispense Refill  . allopurinol (ZYLOPRIM) 100 MG tablet Take 200 mg by mouth daily.    . carvedilol (COREG) 6.25 MG tablet Take 6.25 mg by mouth 2 (two) times daily with a meal.    . cholecalciferol (VITAMIN D) 1000 UNITS tablet Take 1,000 Units by mouth daily.    . furosemide (LASIX) 40 MG tablet Take 80 mg by mouth daily. Take one tab in the afternoon.    . gabapentin (NEURONTIN) 300 MG capsule Take 300 mg by mouth 2 (two) times daily.    [name] HYDROcodone-acetaminophen (NORCO) 10-325 MG tablet Take 1 tablet daily as needed by mouth.    [name] LORazepam (ATIVAN) 0.5 MG tablet Take 0.5 mg by mouth 2 (two) times daily as needed for anxiety.    . magnesium oxide (MAG-OX) 400 (241.3 MG) MG tablet TAKE ONE TABLET BY MOUTH ONE TIME DAILY 30 tablet 4  . melatonin 3 MG TABS tablet Take 3 mg by mouth at bedtime.    . Menthol, Topical Analgesic, (BIOFREEZE COLORLESS EX)  Apply 1 application topically 2 (two) times daily.    . metFORMIN (GLUCOPHAGE) 850 MG tablet Take 850 mg by mouth 2 (two) times daily.    . metolazone (ZAROXOLYN) 2.5 MG tablet Take 2.5 mg by mouth as directed. TAKE Monday, Wednesday, and Friday 30 MINUTES BEFORE LASIX    . Multiple Vitamins-Minerals (CERTA-VITE PO) Take 1 tablet by mouth daily.    . nitroGLYCERIN (NITROSTAT) 0.4 MG SL tablet Place 0.4 mg under the tongue every 5 (five) minutes as needed for chest pain (X 3 DOSES FOR CHEST PAIN).    [name] polyethylene glycol powder (GLYCOLAX/MIRALAX) powder Take 17 g by mouth daily.    . potassium chloride (KLOR-CON) 10 MEQ tablet Take 40 mEq by mouth daily.    . predniSONE (DELTASONE) 20 MG tablet Take 20 mg by mouth daily with breakfast.     No current facility-administered medications for this visit.    Allergies:   Aspirin,  Tramadol, and Naproxen    Social History:  The patient  reports that she has never smoked. She has never used smokeless tobacco. She reports that she does not drink alcohol and does not use drugs.   Family History:  The patient's family history includes Cancer in her son; Cardiomyopathy in her father; Coronary artery disease in her son; Dementia in her mother; Diabetes in her father; Heart failure in her father.    ROS:  Please see the history of present illness.   Otherwise, review of systems are positive for recent confusion.   All other systems are reviewed and negative.    PHYSICAL EXAM: VS:  BP 136/74   Pulse 91   Ht 5' (1.524 m)   SpO2 98%   BMI 37.11 kg/m  , BMI Body mass index is 37.11 kg/m. GEN: Well nourished, well developed, in no acute distress ; lying in a geri-chair HEENT: normal  Neck: no JVD, carotid bruits, or masses Cardiac: RRR; no murmurs, rubs, or gallops,no edema  Respiratory:  clear to auscultation bilaterally, normal work of breathing GI: soft, nontender, nondistended, + BS, obese MS: no deformity or atrophy  Skin: warm and dry, no rash Neuro:  Strength and sensation are intact Psych: euthymic mood, full affect    Recent Labs: 12/01/2020: ALT 10; BUN 31; Creatinine, [name] 1.45; Hemoglobin 14.1; Platelets 291; Potassium 3.7; Sodium 139   Lipid Panel No results found for: CHOL, TRIG, HDL, CHOLHDL, VLDL, LDLCALC, LDLDIRECT   Other studies Reviewed: Additional studies/ records that were reviewed today with results demonstrating: ER records reviewed.   ASSESSMENT AND PLAN:  1. Chronic diastolic heart failure: Currently appears euvolemic.  Avoid dehydration.  Unclear if this was the cause of her confusion.  Cr was increased from baseline.  Recheck BMet today in the setting of CRI.  2. Pacer: Followed with Dr.  Ladona Ridgel. Check today 3. Morbid obesity: Chronic issue.   4. HTN: The current medical regimen is effective;  continue present plan and  medications.  Unable to weight as she is wheelchair bound    Current medicines are reviewed at length with the patient today.  The patient concerns regarding her medicines were addressed.  The following changes have been made:  No change  Labs/ tests ordered today include:   Orders Placed This Encounter  Procedures  . Basic metabolic panel    Recommend 150 minutes/week of aerobic exercise Low fat, low carb, high fiber diet recommended  Disposition:   FU in 1 year   Signed, Lance Muss, MD  12/14/2020 2:04 PM    [facility] [address] [place], [place], [place]  [address] Phone: [phone]; Fax: [phone]

## 2020-12-14 ENCOUNTER — Encounter: Payer: Self-pay | Admitting: Interventional Cardiology

## 2020-12-14 ENCOUNTER — Encounter: Payer: Self-pay | Admitting: Internal Medicine

## 2020-12-14 ENCOUNTER — Ambulatory Visit (INDEPENDENT_AMBULATORY_CARE_PROVIDER_SITE_OTHER): Payer: Medicare Other | Admitting: Interventional Cardiology

## 2020-12-14 ENCOUNTER — Ambulatory Visit (INDEPENDENT_AMBULATORY_CARE_PROVIDER_SITE_OTHER): Payer: Medicare Other | Admitting: Internal Medicine

## 2020-12-14 ENCOUNTER — Other Ambulatory Visit: Payer: Self-pay

## 2020-12-14 ENCOUNTER — Telehealth: Payer: Self-pay | Admitting: Internal Medicine

## 2020-12-14 VITALS — BP 136/74 | HR 91

## 2020-12-14 VITALS — BP 136/74 | HR 91 | Ht 60.0 in

## 2020-12-14 DIAGNOSIS — I10 Essential (primary) hypertension: Secondary | ICD-10-CM

## 2020-12-14 DIAGNOSIS — I5032 Chronic diastolic (congestive) heart failure: Secondary | ICD-10-CM

## 2020-12-14 DIAGNOSIS — Z950 Presence of cardiac pacemaker: Secondary | ICD-10-CM

## 2020-12-14 DIAGNOSIS — I4430 Unspecified atrioventricular block: Secondary | ICD-10-CM | POA: Diagnosis not present

## 2020-12-14 NOTE — Patient Instructions (Signed)
Medication Instructions:  Your physician recommends that you continue on your current medications as directed. Please refer to the Current Medication list given to you today.  *If you need a refill on your cardiac medications before your next appointment, please call your pharmacy*   Lab Work: TODAY:  BMET If you have labs (blood work) drawn today and your tests are completely normal, you will receive your results only by: [name] MyChart Message (if you have MyChart) OR . A paper copy in the mail If you have any lab test that is abnormal or we need to change your treatment, we will call you to review the results.   Testing/Procedures: NONE   Follow-Up: At [facility], you and your health needs are our priority.  As part of our continuing mission to provide you with exceptional heart care, we have created designated Provider Care Teams.  These Care Teams include your primary Cardiologist (physician) and Advanced Practice Providers (APPs -  Physician Assistants and Nurse Practitioners) who all work together to provide you with the care you need, when you need it.  We recommend signing up for the patient portal called "MyChart".  Sign up information is provided on this After Visit Summary.  MyChart is used to connect with patients for Virtual Visits (Telemedicine).  Patients are able to view lab/test results, encounter notes, upcoming appointments, etc.  Non-urgent messages can be sent to your provider as well.   To learn more about what you can do with MyChart, go to [email or web address].    Your next appointment:   1 year(s)  The format for your next appointment:   In Person  Provider:   You may see Lance Muss, MD or one of the following Advanced Practice Providers on your designated Care Team:    Ronie Spies, PA-C  Jacolyn Reedy, PA-C

## 2020-12-14 NOTE — Telephone Encounter (Signed)
Returning phone call to advise battery has 17 months left on battery check that was completed today. Spoke to [place] ([place]). Questions answered to the best of my ability. Advised to call with further questions or concerns.

## 2020-12-14 NOTE — Telephone Encounter (Signed)
Patient's daughter states she was told in December of  2020 that the patient had only a year left on her pacemaker, but today at her appointment the patient's son was told she has 16 months left. She would like a call back to clarify.

## 2020-12-14 NOTE — Patient Instructions (Addendum)
Medication Instructions:  Your physician recommends that you continue on your current medications as directed. Please refer to the Current Medication list given to you today.  Labwork: None ordered.  Testing/Procedures: None ordered.  Follow-Up: Your physician wants you to follow-up in: 6 months with device clinic for a device check.      May 25, 2021 at 2:00 pm at the [facility] office  Your physician wants you to follow-up in: one year with Lewayne Bunting, MD or one of the following Advanced Practice Providers on your designated Care Team:    Gypsy Balsam, NP  Francis Dowse, PA-C  Casimiro Needle "Mardelle Matte" Lanna Poche, [place]  Any Other Special Instructions Will Be Listed Below (If Applicable).  If you need a refill on your cardiac medications before your next appointment, please call your pharmacy.

## 2020-12-14 NOTE — Progress Notes (Signed)
HPI [name] returns today for followup. She is a pleasant 87 yo woman, with a h/o CHB, s/p PPM chronic venous stasis, now wheelchair bound who present for ongoing evaluation of her PPM and CHB. She denies chest pain and her dyspnea is now wheelchair bound. Allergies  Allergen Reactions  . Aspirin Rash and Other (See Comments)    GI distress  . Tramadol Other (See Comments)    GI distress  . Naproxen      Current Outpatient Medications  Medication Sig Dispense Refill  . allopurinol (ZYLOPRIM) 100 MG tablet Take 200 mg by mouth daily.    . carvedilol (COREG) 6.25 MG tablet Take 6.25 mg by mouth 2 (two) times daily with a meal.    . cholecalciferol (VITAMIN D) 1000 UNITS tablet Take 1,000 Units by mouth daily.    . furosemide (LASIX) 40 MG tablet Take 80 mg by mouth daily. Take one tab in the afternoon.    . gabapentin (NEURONTIN) 300 MG capsule Take 300 mg by mouth 2 (two) times daily.    [name] HYDROcodone-acetaminophen (NORCO) 10-325 MG tablet Take 1 tablet daily as needed by mouth.    [name] LORazepam (ATIVAN) 0.5 MG tablet Take 0.5 mg by mouth 2 (two) times daily as needed for anxiety.    . magnesium oxide (MAG-OX) 400 (241.3 MG) MG tablet TAKE ONE TABLET BY MOUTH ONE TIME DAILY 30 tablet 4  . melatonin 3 MG TABS tablet Take 3 mg by mouth at bedtime.    . Menthol, Topical Analgesic, (BIOFREEZE COLORLESS EX) Apply 1 application topically 2 (two) times daily.    . metFORMIN (GLUCOPHAGE) 850 MG tablet Take 850 mg by mouth 2 (two) times daily.    . metolazone (ZAROXOLYN) 2.5 MG tablet Take 2.5 mg by mouth as directed. TAKE Monday, Wednesday, and Friday 30 MINUTES BEFORE LASIX    . Multiple Vitamins-Minerals (CERTA-VITE PO) Take 1 tablet by mouth daily.    . nitroGLYCERIN (NITROSTAT) 0.4 MG SL tablet Place 0.4 mg under the tongue every 5 (five) minutes as needed for chest pain (X 3 DOSES FOR CHEST PAIN).    [name] polyethylene glycol powder (GLYCOLAX/MIRALAX) powder Take 17 g by mouth daily.     . potassium chloride (KLOR-CON) 10 MEQ tablet Take 40 mEq by mouth daily.    . predniSONE (DELTASONE) 20 MG tablet Take 20 mg by mouth daily with breakfast.     No current facility-administered medications for this visit.     Past Medical History:  Diagnosis Date  . Arthritis    "qwhere"  . AV block     2:1  January 14, 2011 / pacemaker plan when cellulitis resolved in length  . Bradycardia    2:1  AV block  . Cellulitis    Superficial cellulitis right lower leg   January 14, 2011  . Chronic diastolic CHF (congestive heart failure) (HCC)   . Diabetes mellitus without complication (HCC)   . Edema    . EF 65-70%, echo, November 29, 2010  . Ejection fraction    EF 65%, echo, January, 2012  . Hypertension   . Hypokalemia    October, 2012  . Murmur    No significant valvular disease by echo, January, 2012  . Pacemaker    January, 2013 Medtronic Sensa  . Pulmonary hypertension (HCC)     46 mmHg... echo... January, 2012    ROS:   All systems reviewed and negative except as noted in the HPI.  Past Surgical History:  Procedure Laterality Date  . CHOLECYSTECTOMY    . HIP ARTHROPLASTY Right   . INSERT / REPLACE / REMOVE PACEMAKER  11/2011  . PARTIAL HYSTERECTOMY     vaginal  . PERMANENT PACEMAKER INSERTION N/A 12/09/2011   Procedure: PERMANENT PACEMAKER INSERTION;  Surgeon: Marinus Maw, MD;  Location: [facility] CATH LAB;  Service: Cardiovascular;  Laterality: N/A;     Family History  Problem Relation Age of Onset  . Cardiomyopathy Father   . Diabetes Father   . Heart failure Father   . Dementia Mother   . Cancer Son   . Coronary artery disease Son   . Heart attack Neg Hx      Social History   Socioeconomic History  . Marital status: Widowed    Spouse name: Not on file  . Number of children: Not on file  . Years of education: Not on file  . Highest education level: Not on file  Occupational History  . Occupation: Retired    Associate Professor: RETIRED  Tobacco Use  .  Smoking status: Never Smoker  . Smokeless tobacco: Never Used  Vaping Use  . Vaping Use: Never used  Substance and Sexual Activity  . Alcohol use: No  . Drug use: No  . Sexual activity: Never  Other Topics Concern  . Not on file  Social History Narrative   Retired    Widowed    Tobacco Use - No.    Alcohol Use - no   Regular Exercise - no   Drug Use - no   Social Determinants of Corporate investment banker Strain: Not on file  Food Insecurity: Not on file  Transportation Needs: Not on file  Physical Activity: Not on file  Stress: Not on file  Social Connections: Not on file  Intimate Partner Violence: Not on file     BP 136/74   Pulse 91   Physical Exam:  Well appearing NAD HEENT: Unremarkable Neck:  No JVD, no thyromegally Lymphatics:  No adenopathy Back:  No CVA tenderness Lungs:  Clear with no wheezes HEART:  Regular rate rhythm, no murmurs, no rubs, no clicks Abd:  soft, positive bowel sounds, no organomegally, no rebound, no guarding Ext:  2 plus pulses, no edema, no cyanosis, no clubbing Skin:  No rashes no nodules Neuro:  CN II through XII intact, motor grossly intact  DEVICE  Normal device function.  See PaceArt for details.   Assess/Plan: 1. CHB -she is asymptomatic, s/p PPM insertion. 2. PPM - her Medtronic DDD PM is working normally with app 1 years of battery longevity. 3. HTN - her blood pressure is well controlled. She will continue her current meds. 4. Chronic diastolic heart failure - her lasix dose is stable and she will maintain a low sodium diet.  Sharlot Gowda Mavin Dyke,MD

## 2020-12-15 LAB — CUP PACEART INCLINIC DEVICE CHECK
Battery Impedance: 3129 Ohm
Battery Remaining Longevity: 17 mo
Battery Voltage: 2.71 V
Brady Statistic AP VP Percent: 0 %
Brady Statistic AP VS Percent: 0 %
Brady Statistic AS VP Percent: 100 %
Brady Statistic AS VS Percent: 0 %
Date Time Interrogation Session: 20220124165919
Implantable Lead Implant Date: 20130118
Implantable Lead Implant Date: 20130118
Implantable Lead Location: 753859
Implantable Lead Location: 753860
Implantable Lead Model: 5076
Implantable Lead Model: 5076
Implantable Pulse Generator Implant Date: 20130118
Lead Channel Impedance Value: 391 Ohm
Lead Channel Impedance Value: 498 Ohm
Lead Channel Pacing Threshold Amplitude: 0.75 V
Lead Channel Pacing Threshold Amplitude: 1 V
Lead Channel Pacing Threshold Pulse Width: 0.4 ms
Lead Channel Pacing Threshold Pulse Width: 0.4 ms
Lead Channel Sensing Intrinsic Amplitude: 4 mV
Lead Channel Setting Pacing Amplitude: 2 V
Lead Channel Setting Pacing Amplitude: 2.5 V
Lead Channel Setting Pacing Pulse Width: 0.4 ms
Lead Channel Setting Sensing Sensitivity: 2.8 mV

## 2020-12-15 LAB — BASIC METABOLIC PANEL
BUN/Creatinine Ratio: 41 — ABNORMAL HIGH (ref 12–28)
BUN: 41 mg/dL — ABNORMAL HIGH (ref 8–27)
CO2: 24 mmol/L (ref 20–29)
Calcium: 10.9 mg/dL — ABNORMAL HIGH (ref 8.7–10.3)
Chloride: 95 mmol/L — ABNORMAL LOW (ref 96–106)
Creatinine, Ser: 0.99 mg/dL (ref 0.57–1.00)
GFR calc Af Amer: 59 mL/min/{1.73_m2} — ABNORMAL LOW (ref >59)
GFR calc non Af Amer: 51 mL/min/{1.73_m2} — ABNORMAL LOW (ref >59)
Glucose: 182 mg/dL — ABNORMAL HIGH (ref 65–99)
Potassium: 4.3 mmol/L (ref 3.5–5.2)
Sodium: 139 mmol/L (ref 134–144)

## 2021-01-12 ENCOUNTER — Telehealth: Payer: Self-pay | Admitting: Interventional Cardiology

## 2021-01-12 NOTE — Telephone Encounter (Signed)
[name] is calling stating Milley saw her neurologist today and they are wanting to do a sleep study. She states they advised her it can be done at their facility or the hospital. [name] is requesting a callback from a nurse to discuss which location Dr. Eldridge Dace thinks would be best for her to have it performed. Garnet states she would feel more comfortable at the hospital due to having a pacemaker, but they would like Dr. Hoyle Barr opinion as well. Please advise.

## 2021-01-12 NOTE — Telephone Encounter (Signed)
EIther place would be fine.  Pacemaker should not affect the study.

## 2021-01-12 NOTE — Telephone Encounter (Signed)
I spoke with patient's daughter and gave her information from Dr Varanasi 

## 2021-03-10 ENCOUNTER — Other Ambulatory Visit (HOSPITAL_BASED_OUTPATIENT_CLINIC_OR_DEPARTMENT_OTHER): Payer: Self-pay

## 2021-03-10 DIAGNOSIS — G4733 Obstructive sleep apnea (adult) (pediatric): Secondary | ICD-10-CM

## 2021-03-17 ENCOUNTER — Encounter: Payer: Medicare Other | Admitting: Neurology

## 2021-03-29 ENCOUNTER — Ambulatory Visit: Payer: Medicare Other | Attending: Neurology | Admitting: Neurology

## 2021-03-29 ENCOUNTER — Other Ambulatory Visit: Payer: Self-pay

## 2021-03-29 DIAGNOSIS — G4733 Obstructive sleep apnea (adult) (pediatric): Secondary | ICD-10-CM | POA: Diagnosis present

## 2021-04-18 NOTE — Procedures (Unsigned)
[facility] Melika Reder A. Gerilyn Pilgrim, MD     [email or web address]           HOME SLEEP STUDY         LOCATION: [facility]   Patient Name: [name], [name] Date: 03/29/2021 Gender: Female D.O.B: 10-Dec-1930 Age (years): 43 Referring Provider: Beryle Beams MD, ABSM Height (inches): 60 Interpreting Physician: Beryle Beams MD, ABSM Weight (lbs): 182 RPSGT: Peak, Robert BMI: 36 MRN: [id number] Neck Size: 18.00 CLINICAL INFORMATION Sleep Study Type: HST     Indication for sleep study: N/A     Epworth Sleepiness Score:  SLEEP STUDY TECHNIQUE A multi-channel overnight portable sleep study was performed. The channels recorded were: nasal airflow, thoracic respiratory movement, and oxygen saturation with a pulse oximetry. Snoring was also monitored.  MEDICATIONS Patient self administered medications include: N/A.  Current Outpatient Medications:  .  allopurinol (ZYLOPRIM) 100 MG tablet, Take 200 mg by mouth daily., Disp: , Rfl:  .  carvedilol (COREG) 6.25 MG tablet, Take 6.25 mg by mouth 2 (two) times daily with a meal., Disp: , Rfl:  .  cholecalciferol (VITAMIN D) 1000 UNITS tablet, Take 1,000 Units by mouth daily., Disp: , Rfl:  .  furosemide (LASIX) 40 MG tablet, Take 80 mg by mouth daily. Take one tab in the afternoon., Disp: , Rfl:  .  gabapentin (NEURONTIN) 300 MG capsule, Take 300 mg by mouth 2 (two) times daily., Disp: , Rfl:  .  HYDROcodone-acetaminophen (NORCO) 10-325 MG tablet, Take 1 tablet daily as needed by mouth., Disp: , Rfl:  .  LORazepam (ATIVAN) 0.5 MG tablet, Take 0.5 mg by mouth 2 (two) times daily as needed for anxiety., Disp: , Rfl:  .  magnesium oxide (MAG-OX) 400 (241.3 MG) MG tablet, TAKE ONE TABLET BY MOUTH ONE TIME DAILY, Disp: 30 tablet, Rfl: 4 .  melatonin 3 MG TABS tablet, Take 3 mg by mouth at bedtime., Disp: , Rfl:  .  Menthol, Topical Analgesic, (BIOFREEZE COLORLESS EX), Apply 1 application topically 2 (two) times daily.,  Disp: , Rfl:  .  metFORMIN (GLUCOPHAGE) 850 MG tablet, Take 850 mg by mouth 2 (two) times daily., Disp: , Rfl:  .  metolazone (ZAROXOLYN) 2.5 MG tablet, Take 2.5 mg by mouth as directed. TAKE Monday, Wednesday, and Friday 30 MINUTES BEFORE LASIX, Disp: , Rfl:  .  Multiple Vitamins-Minerals (CERTA-VITE PO), Take 1 tablet by mouth daily., Disp: , Rfl:  .  nitroGLYCERIN (NITROSTAT) 0.4 MG SL tablet, Place 0.4 mg under the tongue every 5 (five) minutes as needed for chest pain (X 3 DOSES FOR CHEST PAIN)., Disp: , Rfl:  .  polyethylene glycol powder (GLYCOLAX/MIRALAX) powder, Take 17 g by mouth daily., Disp: , Rfl:  .  potassium chloride (KLOR-CON) 10 MEQ tablet, Take 40 mEq by mouth daily., Disp: , Rfl:  .  predniSONE (DELTASONE) 20 MG tablet, Take 20 mg by mouth daily with breakfast., Disp: , Rfl:     SLEEP ARCHITECTURE Patient was studied for 303.5 minutes. The sleep efficiency was 70.8 % and the patient was supine for 99.6%. The arousal index was 0.0 per hour.  RESPIRATORY PARAMETERS The overall AHI was 24.7 per hour, with a central apnea index of 0.4 per hour.  The oxygen nadir was 78% during sleep.     CARDIAC DATA Mean heart rate during sleep was 74.2 bpm.  IMPRESSIONS 1.  Moderate obstructive sleep apnea is noted with this recording. Auto PAP 8-14 is recommended.   Argie Ramming, MD Diplomate, American  Board of Sleep Medicine.  ELECTRONICALLY SIGNED ON:  04/18/2021, 5:02 PM [facility] PH: [phone]   FX: [phone] ACCREDITED BY THE AMERICAN ACADEMY OF SLEEP MEDICINE

## 2021-05-25 ENCOUNTER — Ambulatory Visit (INDEPENDENT_AMBULATORY_CARE_PROVIDER_SITE_OTHER): Payer: Medicare Other

## 2021-05-25 ENCOUNTER — Other Ambulatory Visit: Payer: Self-pay

## 2021-05-25 DIAGNOSIS — I4430 Unspecified atrioventricular block: Secondary | ICD-10-CM | POA: Diagnosis not present

## 2021-05-25 LAB — CUP PACEART INCLINIC DEVICE CHECK
Battery Impedance: 3942 Ohm
Battery Remaining Longevity: 12 mo
Battery Voltage: 2.69 V
Brady Statistic AP VP Percent: 2 %
Brady Statistic AP VS Percent: 0 %
Brady Statistic AS VP Percent: 96 %
Brady Statistic AS VS Percent: 1 %
Date Time Interrogation Session: 20220705142637
Implantable Lead Implant Date: 20130118
Implantable Lead Implant Date: 20130118
Implantable Lead Location: 753859
Implantable Lead Location: 753860
Implantable Lead Model: 5076
Implantable Lead Model: 5076
Implantable Pulse Generator Implant Date: 20130118
Lead Channel Impedance Value: 395 Ohm
Lead Channel Impedance Value: 520 Ohm
Lead Channel Pacing Threshold Amplitude: 1 V
Lead Channel Pacing Threshold Amplitude: 1 V
Lead Channel Pacing Threshold Pulse Width: 0.4 ms
Lead Channel Pacing Threshold Pulse Width: 0.4 ms
Lead Channel Sensing Intrinsic Amplitude: 1.4 mV
Lead Channel Setting Pacing Amplitude: 2 V
Lead Channel Setting Pacing Amplitude: 2.5 V
Lead Channel Setting Pacing Pulse Width: 0.4 ms
Lead Channel Setting Sensing Sensitivity: 2.8 mV

## 2021-05-25 NOTE — Progress Notes (Signed)
Pacemaker check in clinic. Normal device function. Thresholds, P wave sensing, impedances consistent with previous measurements. Device programmed to maximize longevity. No mode switches.  3 high ventricular rates noted, longest episode was 7 beats of NSVT Peak A/ V rates of 104/208. Device programmed at appropriate safety margins. Histogram distribution appropriate for patient activity level. Estimated longevity 12 months until ERI. Patient unable to participate in remote follow-up, ROV to clinic is 6 months with Dr. Ladona Ridgel or PA.  Patient/ caregiver provided with phone number to call and scheduled appt.  Patient education completed.

## 2021-05-25 NOTE — Patient Instructions (Signed)
Please call [phone] to schedule follow-up appointment with Dr.Taylor or his PA in 6 months.

## 2021-08-02 ENCOUNTER — Telehealth: Payer: Self-pay | Admitting: Internal Medicine

## 2021-08-02 NOTE — Telephone Encounter (Signed)
Pt is wanted a call back from Dr. Amedeo Plenty office...[name] please advise

## 2021-12-09 NOTE — Progress Notes (Signed)
Cardiology Office Note   Date:  12/10/2021   ID:  [name], DOB 09/08/1931, MRN [id number]  PCP:  System, Provider Not In    Chief Complaint  Patient presents with   Follow-up   Chronic diastolic heart failure  Wt Readings from Last 3 Encounters:  12/10/21 214 lb (97.1 kg)  11/30/20 190 lb (86.2 kg)  11/20/19 201 lb (91.2 kg)       History of Present Illness: [name] is a 88 y.o. female  Who previously saw Dr. Myrtis Ser.  She has chronic diastolic heart failure and permanent pacemaker placement. She is wheelchair-bound.   She is managed with furosemide and metolazone.    She is pacer dependent.     She went to the ER in Jan 2022 for confusion.  THought to be dehydrated with a possible UTI.    Negative head CT.  She needs oxygen at times.   SHe has had a persistent cough after a cough.  Denies : Chest pain. Dizziness.  Nitroglycerin use. Orthopnea. Palpitations. Paroxysmal nocturnal dyspnea. Shortness of breath. Syncope.    She has a pacer check later today.     Past Medical History:  Diagnosis Date   Arthritis    "qwhere"   AV block     2:1  January 14, 2011 / pacemaker plan when cellulitis resolved in length   Bradycardia    2:1  AV block   Cellulitis    Superficial cellulitis right lower leg   January 14, 2011   Chronic diastolic CHF (congestive heart failure) (HCC)    Diabetes mellitus without complication (HCC)    Edema    . EF 65-70%, echo, November 29, 2010   Ejection fraction    EF 65%, echo, January, 2012   Hypertension    Hypokalemia    October, 2012   Murmur    No significant valvular disease by echo, January, 2012   Pacemaker    January, 2013 Medtronic Sensa   Pulmonary hypertension (HCC)     46 mmHg... echo... January, 2012    Past Surgical History:  Procedure Laterality Date   CHOLECYSTECTOMY     HIP ARTHROPLASTY Right    INSERT / REPLACE / REMOVE PACEMAKER  11/2011   PARTIAL HYSTERECTOMY     vaginal   PERMANENT  PACEMAKER INSERTION N/A 12/09/2011   Procedure: PERMANENT PACEMAKER INSERTION;  Surgeon: Marinus Maw, MD;  Location: [facility] CATH LAB;  Service: Cardiovascular;  Laterality: N/A;     Current Outpatient Medications  Medication Sig Dispense Refill   carvedilol (COREG) 6.25 MG tablet Take 6.25 mg by mouth 2 (two) times daily with a meal.     cholecalciferol (VITAMIN D) 1000 UNITS tablet Take 1,000 Units by mouth daily.     furosemide (LASIX) 40 MG tablet Take 80 mg by mouth daily. Take one tab in the afternoon.     gabapentin (NEURONTIN) 300 MG capsule Take 300 mg by mouth 2 (two) times daily.     HYDROcodone-acetaminophen (NORCO/VICODIN) 5-325 MG tablet Take 1 tablet by mouth every 6 (six) hours as needed.     loratadine (CLARITIN) 10 MG tablet Take 10 mg by mouth daily.     magnesium oxide (MAG-OX) 400 (241.3 MG) MG tablet TAKE ONE TABLET BY MOUTH ONE TIME DAILY 30 tablet 4   melatonin 3 MG TABS tablet Take 3 mg by mouth at bedtime.     Menthol, Topical Analgesic, (BIOFREEZE COLORLESS EX) Apply 1 application topically 2 (  two) times daily.     metolazone (ZAROXOLYN) 2.5 MG tablet Take 2.5 mg by mouth as directed. TAKE Monday, Wednesday, and Friday 30 MINUTES BEFORE LASIX     Multiple Vitamins-Minerals (CERTA-VITE PO) Take 1 tablet by mouth daily.     Multiple Vitamins-Minerals (CERTAVITE/ANTIOXIDANTS PO) Take 1 tablet by mouth daily.     nitroGLYCERIN (NITROSTAT) 0.4 MG SL tablet Place 0.4 mg under the tongue every 5 (five) minutes as needed for chest pain (X 3 DOSES FOR CHEST PAIN).     polyethylene glycol powder (GLYCOLAX/MIRALAX) powder Take 17 g by mouth daily.     potassium chloride (KLOR-CON) 10 MEQ tablet Take 40 mEq by mouth daily.     predniSONE (DELTASONE) 20 MG tablet Take 20 mg by mouth daily with breakfast.     sitaGLIPtin (JANUVIA) 50 MG tablet Take 50 mg by mouth daily.     No current facility-administered medications for this visit.    Allergies:   Aspirin, Tramadol, Naproxen,  and Shellfish-derived products    Social History:  The patient  reports that she has never smoked. She has never used smokeless tobacco. She reports that she does not drink alcohol and does not use drugs.   Family History:  The patient's family history includes Cancer in her son; Cardiomyopathy in her father; Coronary artery disease in her son; Dementia in her mother; Diabetes in her father; Heart failure in her father.    ROS:  Please see the history of present illness.   Otherwise, review of systems are positive for cough.   All other systems are reviewed and negative.    PHYSICAL EXAM: VS:  BP 130/84    Pulse (!) 59    Ht $R'4\' 7"'kT$  (1.397 m)    Wt 214 lb (97.1 kg) Comment: 214lbs   SpO2 93%    BMI 49.74 kg/m  , BMI Body mass index is 49.74 kg/m. GEN: Well nourished, well developed, in no acute distress HEENT: normal Neck: no JVD, carotid bruits, or masses Cardiac: RRR; no murmurs, rubs, or gallops,; bilateral pitting pedal edema  Respiratory:  clear to auscultation bilaterally, normal work of breathing GI: soft, nontender, nondistended, + BS MS: no deformity or atrophy Skin: warm and dry, no rash Neuro:  Strength and sensation are intact Psych: euthymic mood, full affect   EKG:   The ekg ordered today demonstrates V paced rhythm   Recent Labs: 12/14/2020: BUN 41; Creatinine, Ser 0.99; Potassium 4.3; Sodium 139   Lipid Panel No results found for: CHOL, TRIG, HDL, CHOLHDL, VLDL, LDLCALC, LDLDIRECT   Other studies Reviewed: Additional studies/ records that were reviewed today with results demonstrating: no labs since 2022; Cr 0.99 in Jan 2022.   ASSESSMENT AND PLAN:  Chronic diastolic heart failure: Difficult to assess volume status due to her obesity.  She does have some chronic lower extremity edema.  Continue diuretics.  Check labs today.  I encouraged her to try to be as active as she can be.  She tries to do some exercises moving her limbs in her wheelchair. Pacemaker:  followed by EP. Morbid obesity: Chronic issue. Hypertension: The current medical regimen is effective;  continue present plan and medications. Cough, shortness of breath: Son feels that the cough has been going on for several months.  May be related to allergies.  She is taking Claritin at her facility.  Will check BMP today.  No obvious signs of pneumonia on exam today.    Current medicines are reviewed at length  with the patient today.  The patient concerns regarding her medicines were addressed.  The following changes have been made:  No change  Labs/ tests ordered today include:   Orders Placed This Encounter  Procedures   CBC   Comp Met (CMET)   TSH   Pro b natriuretic peptide    Recommend 150 minutes/week of aerobic exercise Low fat, low carb, high fiber diet recommended  Disposition:   FU in 1 year   Signed, Larae Grooms, MD  12/10/2021 12:57 PM    [facility] [place], [place], [place]  [address] Phone: [phone]; Fax: [phone]

## 2021-12-10 ENCOUNTER — Ambulatory Visit (INDEPENDENT_AMBULATORY_CARE_PROVIDER_SITE_OTHER): Payer: Medicare Other | Admitting: Internal Medicine

## 2021-12-10 ENCOUNTER — Other Ambulatory Visit: Payer: Self-pay

## 2021-12-10 ENCOUNTER — Encounter: Payer: Self-pay | Admitting: Interventional Cardiology

## 2021-12-10 ENCOUNTER — Ambulatory Visit (INDEPENDENT_AMBULATORY_CARE_PROVIDER_SITE_OTHER): Payer: Medicare Other | Admitting: Interventional Cardiology

## 2021-12-10 VITALS — BP 130/84 | HR 59 | Ht <= 58 in | Wt 214.0 lb

## 2021-12-10 DIAGNOSIS — I5032 Chronic diastolic (congestive) heart failure: Secondary | ICD-10-CM

## 2021-12-10 DIAGNOSIS — Z950 Presence of cardiac pacemaker: Secondary | ICD-10-CM

## 2021-12-10 DIAGNOSIS — I4430 Unspecified atrioventricular block: Secondary | ICD-10-CM | POA: Diagnosis not present

## 2021-12-10 DIAGNOSIS — I10 Essential (primary) hypertension: Secondary | ICD-10-CM | POA: Diagnosis not present

## 2021-12-10 DIAGNOSIS — R053 Chronic cough: Secondary | ICD-10-CM

## 2021-12-10 LAB — CUP PACEART INCLINIC DEVICE CHECK
Date Time Interrogation Session: 20230120124051
Implantable Lead Implant Date: 20130118
Implantable Lead Implant Date: 20130118
Implantable Lead Location: 753859
Implantable Lead Location: 753860
Implantable Lead Model: 5076
Implantable Lead Model: 5076
Implantable Pulse Generator Implant Date: 20130118
Lead Channel Pacing Threshold Amplitude: 0.5 V
Lead Channel Pacing Threshold Pulse Width: 0.4 ms

## 2021-12-10 NOTE — Progress Notes (Signed)
° ° ° ° °HPI °[name] returns today for followup. She is a pleasant 88 yo woman, with a h/o CHB, s/p PPM chronic, venous stasis, now wheelchair bound who present for ongoing evaluation of her PPM and CHB. She denies chest pain and her dyspnea is now wheelchair bound. She has reached ERI. °Allergies  °Allergen Reactions  ° Aspirin Rash and Other (See Comments)  °  GI distress  ° Tramadol Other (See Comments)  °  GI distress  ° Naproxen   ° Shellfish-Derived Products Itching and Rash  ° ° ° °Current Outpatient Medications  °Medication Sig Dispense Refill  ° carvedilol (COREG) 6.25 MG tablet Take 6.25 mg by mouth 2 (two) times daily with a meal.    ° cholecalciferol (VITAMIN D) 1000 UNITS tablet Take 1,000 Units by mouth daily.    ° furosemide (LASIX) 40 MG tablet Take 80 mg by mouth daily. Take one tab in the afternoon.    ° gabapentin (NEURONTIN) 300 MG capsule Take 300 mg by mouth 2 (two) times daily.    ° HYDROcodone-acetaminophen (NORCO/VICODIN) 5-325 MG tablet Take 1 tablet by mouth every 6 (six) hours as needed.    ° loratadine (CLARITIN) 10 MG tablet Take 10 mg by mouth daily.    ° magnesium oxide (MAG-OX) 400 (241.3 MG) MG tablet TAKE ONE TABLET BY MOUTH ONE TIME DAILY 30 tablet 4  ° melatonin 3 MG TABS tablet Take 3 mg by mouth at bedtime.    ° Menthol, Topical Analgesic, (BIOFREEZE COLORLESS EX) Apply 1 application topically 2 (two) times daily.    ° metolazone (ZAROXOLYN) 2.5 MG tablet Take 2.5 mg by mouth as directed. TAKE Monday, Wednesday, and Friday 30 MINUTES BEFORE LASIX    ° Multiple Vitamins-Minerals (CERTA-VITE PO) Take 1 tablet by mouth daily.    ° Multiple Vitamins-Minerals (CERTAVITE/ANTIOXIDANTS PO) Take 1 tablet by mouth daily.    ° nitroGLYCERIN (NITROSTAT) 0.4 MG SL tablet Place 0.4 mg under the tongue every 5 (five) minutes as needed for chest pain (X 3 DOSES FOR CHEST PAIN).    ° polyethylene glycol powder (GLYCOLAX/MIRALAX) powder Take 17 g by mouth daily.    ° potassium chloride  (KLOR-CON) 10 MEQ tablet Take 40 mEq by mouth daily.    ° predniSONE (DELTASONE) 20 MG tablet Take 20 mg by mouth daily with breakfast.    ° sitaGLIPtin (JANUVIA) 50 MG tablet Take 50 mg by mouth daily.    ° °No current facility-administered medications for this visit.  ° ° ° °Past Medical History:  °Diagnosis Date  ° Arthritis   ° "qwhere"  ° AV block   °  2:1  January 14, 2011 / pacemaker plan when cellulitis resolved in length  ° Bradycardia   ° 2:1  AV block  ° Cellulitis   ° Superficial cellulitis right lower leg   January 14, 2011  ° Chronic diastolic CHF (congestive heart failure) (HCC)   ° Diabetes mellitus without complication (HCC)   ° Edema   ° . EF 65-70%, echo, November 29, 2010  ° Ejection fraction   ° EF 65%, echo, January, 2012  ° Hypertension   ° Hypokalemia   ° October, 2012  ° Murmur   ° No significant valvular disease by echo, January, 2012  ° Pacemaker   ° January, 2013 Medtronic Sensa  ° Pulmonary hypertension (HCC)   °  46 mmHg... echo... January, 2012  ° ° °ROS: ° ° All systems reviewed and negative except as noted   in the HPI.   Past Surgical History:  Procedure Laterality Date   CHOLECYSTECTOMY     HIP ARTHROPLASTY Right    INSERT / REPLACE / REMOVE PACEMAKER  11/2011   PARTIAL HYSTERECTOMY     vaginal   PERMANENT PACEMAKER INSERTION N/A 12/09/2011   Procedure: PERMANENT PACEMAKER INSERTION;  Surgeon: Marinus Maw, MD;  Location: [facility] CATH LAB;  Service: Cardiovascular;  Laterality: N/A;     Family History  Problem Relation Age of Onset   Cardiomyopathy Father    Diabetes Father    Heart failure Father    Dementia Mother    Cancer Son    Coronary artery disease Son    Heart attack Neg Hx      Social History   Socioeconomic History   Marital status: Widowed    Spouse name: Not on file   Number of children: Not on file   Years of education: Not on file   Highest education level: Not on file  Occupational History   Occupation: Retired    Associate Professor: RETIRED   Tobacco Use   Smoking status: Never   Smokeless tobacco: Never  Vaping Use   Vaping Use: Never used  Substance and Sexual Activity   Alcohol use: No   Drug use: No   Sexual activity: Never  Other Topics Concern   Not on file  Social History Narrative   Retired    Widowed    Tobacco Use - No.    Alcohol Use - no   Regular Exercise - no   Drug Use - no   Social Determinants of Corporate investment banker Strain: Not on file  Food Insecurity: Not on file  Transportation Needs: Not on file  Physical Activity: Not on file  Stress: Not on file  Social Connections: Not on file  Intimate Partner Violence: Not on file     BP 130/84    Pulse (!) 59    Ht 4\' 7"  (1.397 m)    Wt 214 lb (97.1 kg)    SpO2 93%    BMI 49.74 kg/m   Physical Exam:  Well appearing NAD HEENT: Unremarkable Neck:  No JVD, no thyromegally Lymphatics:  No adenopathy Back:  No CVA tenderness Lungs:  Clear HEART:  Regular rate rhythm, no murmurs, no rubs, no clicks Abd:  soft, positive bowel sounds, no organomegally, no rebound, no guarding Ext:  2 plus pulses, no edema, no cyanosis, no clubbing Skin:  No rashes no nodules Neuro:  CN II through XII intact, motor grossly intact  EKG  DEVICE  Normal device function.  See PaceArt for details.   Assess/Plan:  1. CHB -she is asymptomatic, s/p PPM insertion. 2. PPM - her Medtronic DDD PM is at [facility]. She will undergo PM gen change out. 3. HTN - her blood pressure is well controlled. She will continue her current meds. 4. Chronic diastolic heart failure - her lasix dose is stable and she will maintain a low sodium diet.   [facility] Anni Hocevar,MD

## 2021-12-10 NOTE — Patient Instructions (Addendum)
Medication Instructions:  °Your physician recommends that you continue on your current medications as directed. Please refer to the Current Medication list given to you today. ° °Labwork: °None ordered. ° °Testing/Procedures: °None ordered. ° °Follow-Up: ° °SEE INSTRUCTION LETTER ° °Any Other Special Instructions Will Be Listed Below (If Applicable). ° °If you need a refill on your cardiac medications before your next appointment, please call your pharmacy.  ° °Pacemaker Battery Change °A pacemaker battery usually lasts 5-15 years (6-7 years on average). A few times a year, you may be asked to visit your health care provider to have a full evaluation of your pacemaker. When the battery is low, your pacemaker will be completely replaced. Most often, this procedure is simpler than the first surgery because the wires (leads) that connect the pacemaker to the heart are already in place. °There are many things that affect how long a pacemaker battery will last, including: °The age of the pacemaker. °The number of leads you have(1, 2, or 3). °The use or workload of the pacemaker. If the pacemaker is helping the heart more often, the battery will not last as long. °Power (voltage) settings. °Tell a health care provider about: °Any allergies you have. °All medicines you are taking, including vitamins, herbs, eye drops, creams, and over-the-counter medicines. °Any problems you or family members have had with anesthetic medicines. °Any blood disorders you have. °Any surgeries you have had, especially any surgeries you have had since your last pacemaker was placed. °Any medical conditions you have. °Whether you are pregnant or may be pregnant. °What are the risks? °Generally, this is a safe procedure. However, problems may occur, including: °Bleeding. °Infection. °Nerve damage. °Allergic reaction to medicines. °Damage to the leads that go to the heart. °What happens before the procedure? °Staying hydrated °Follow instructions  from your health care provider about hydration, which may include: °Up to 2 hours before the procedure - you may continue to drink clear liquids, such as water, clear fruit juice, black coffee, and plain tea. °Eating and drinking restrictions °Follow instructions from your health care provider about eating and drinking restrictions, which may include: °8 hours before the procedure - stop eating heavy meals or foods, such as meat, fried foods, or fatty foods. °6 hours before the procedure - stop eating light meals or foods, such as toast or cereal. °6 hours before the procedure - stop drinking milk or drinks that contain milk. °2 hours before the procedure - stop drinking clear liquids. °Medicines °Ask your health care provider about: °Changing or stopping your regular medicines. This is especially important if you are taking diabetes medicines or blood thinners. °Taking medicines such as aspirin and ibuprofen. These medicines can thin your blood. Do not take these medicines unless your health care provider tells you to take them. °Taking over-the-counter medicines, vitamins, herbs, and supplements. °General instructions °Ask your health care provider what steps will be taken to help prevent infection. These may include: °Removing hair at the surgery site. °Washing skin with a germ-killing soap. °Receiving antibiotic medicine. °Plan to have someone take you home from the hospital or clinic. °If you will be going home right after the procedure, plan to have someone with you for 24 hours. °What happens during the procedure? °An IV will be inserted into one of your veins. °You will be given one or more of the following: °A medicine to help you relax (sedative). °A medicine to numb the area where the pacemaker is located (local anesthetic). °Your health care   provider will make an incision to reopen the pocket holding the pacemaker. °The old pacemaker will be disconnected from the leads. °The leads will be tested. °If  needed, the leads will be replaced. If the leads are functioning properly, the new pacemaker will be connected to the existing leads. °A heart monitor and a pacemaker programmer will be used to make sure that the newly implanted pacemaker is working properly. °The incision site will be closed with stitches (sutures), adhesive strips, or skin glue. °A bandage (dressing) will be placed over the pacemaker site. °The procedure may vary among health care providers and hospitals. °What happens after the procedure? °Your blood pressure, heart rate, breathing rate, and blood oxygen level will be monitored until you leave the hospital or clinic. °You may be given antibiotics. °Your health care provider will tell you when your pacemaker will need to be tested again, or when to return to the office for removal of the dressing and sutures. °If you were given a sedative during the procedure, it can affect you for several hours. Do not drive or operate machinery until your health care provider says that it is safe. °You will be given a pacemaker identification card. This card lists the implant date, device model, and manufacturer of your pacemaker. °Summary °A pacemaker battery usually lasts 5-15 years (6-7 years on average). °When the battery is low, your pacemaker will need to be replaced. °Most often, this procedure is simpler than the first surgery because the wires (leads) that connect the pacemaker to the heart are already in place. °Risks of this procedure include bleeding, infection, and allergic reactions to medicines. °This information is not intended to replace advice given to you by your health care provider. Make sure you discuss any questions you have with your health care provider. °Document Revised: 10/10/2019 Document Reviewed: 10/10/2019 °Elsevier Patient Education © 2022 [employer] Inc. ° ° ° °

## 2021-12-10 NOTE — Patient Instructions (Signed)
Medication Instructions:  Your physician recommends that you continue on your current medications as directed. Please refer to the Current Medication list given to you today.  *If you need a refill on your cardiac medications before your next appointment, please call your pharmacy*   Lab Work: Lab work to be done today--CBC, CMET, TSH, BNP If you have labs (blood work) drawn today and your tests are completely normal, you will receive your results only by: [place] (if you have MyChart) OR A paper copy in the mail If you have any lab test that is abnormal or we need to change your treatment, we will call you to review the results.   Testing/Procedures: none   Follow-Up: At [facility], you and your health needs are our priority.  As part of our continuing mission to provide you with exceptional heart care, we have created designated Provider Care Teams.  These Care Teams include your primary Cardiologist (physician) and Advanced Practice Providers (APPs -  Physician Assistants and Nurse Practitioners) who all work together to provide you with the care you need, when you need it.  We recommend signing up for the patient portal called "MyChart".  Sign up information is provided on this After Visit Summary.  MyChart is used to connect with patients for Virtual Visits (Telemedicine).  Patients are able to view lab/test results, encounter notes, upcoming appointments, etc.  Non-urgent messages can be sent to your provider as well.   To learn more about what you can do with MyChart, go to [email or web address].    Your next appointment:   12 month(s)  The format for your next appointment:   In Person  Provider:   Larae Grooms, MD     Other Instructions

## 2021-12-10 NOTE — Addendum Note (Signed)
Addended by: Kandice Robinsons T on: 12/10/2021 01:38 PM   Modules accepted: Orders

## 2021-12-10 NOTE — H&P (View-Only) (Signed)
HPI [name] returns today for followup. She is a pleasant 88 yo woman, with a h/o CHB, s/p PPM chronic, venous stasis, now wheelchair bound who present for ongoing evaluation of her PPM and CHB. She denies chest pain and her dyspnea is now wheelchair bound. She has reached ERI. Allergies  Allergen Reactions   Aspirin Rash and Other (See Comments)    GI distress   Tramadol Other (See Comments)    GI distress   Naproxen    Shellfish-Derived Products Itching and Rash     Current Outpatient Medications  Medication Sig Dispense Refill   carvedilol (COREG) 6.25 MG tablet Take 6.25 mg by mouth 2 (two) times daily with a meal.     cholecalciferol (VITAMIN D) 1000 UNITS tablet Take 1,000 Units by mouth daily.     furosemide (LASIX) 40 MG tablet Take 80 mg by mouth daily. Take one tab in the afternoon.     gabapentin (NEURONTIN) 300 MG capsule Take 300 mg by mouth 2 (two) times daily.     HYDROcodone-acetaminophen (NORCO/VICODIN) 5-325 MG tablet Take 1 tablet by mouth every 6 (six) hours as needed.     loratadine (CLARITIN) 10 MG tablet Take 10 mg by mouth daily.     magnesium oxide (MAG-OX) 400 (241.3 MG) MG tablet TAKE ONE TABLET BY MOUTH ONE TIME DAILY 30 tablet 4   melatonin 3 MG TABS tablet Take 3 mg by mouth at bedtime.     Menthol, Topical Analgesic, (BIOFREEZE COLORLESS EX) Apply 1 application topically 2 (two) times daily.     metolazone (ZAROXOLYN) 2.5 MG tablet Take 2.5 mg by mouth as directed. TAKE Monday, Wednesday, and Friday 30 MINUTES BEFORE LASIX     Multiple Vitamins-Minerals (CERTA-VITE PO) Take 1 tablet by mouth daily.     Multiple Vitamins-Minerals (CERTAVITE/ANTIOXIDANTS PO) Take 1 tablet by mouth daily.     nitroGLYCERIN (NITROSTAT) 0.4 MG SL tablet Place 0.4 mg under the tongue every 5 (five) minutes as needed for chest pain (X 3 DOSES FOR CHEST PAIN).     polyethylene glycol powder (GLYCOLAX/MIRALAX) powder Take 17 g by mouth daily.     potassium chloride  (KLOR-CON) 10 MEQ tablet Take 40 mEq by mouth daily.     predniSONE (DELTASONE) 20 MG tablet Take 20 mg by mouth daily with breakfast.     sitaGLIPtin (JANUVIA) 50 MG tablet Take 50 mg by mouth daily.     No current facility-administered medications for this visit.     Past Medical History:  Diagnosis Date   Arthritis    "qwhere"   AV block     2:1  January 14, 2011 / pacemaker plan when cellulitis resolved in length   Bradycardia    2:1  AV block   Cellulitis    Superficial cellulitis right lower leg   January 14, 2011   Chronic diastolic CHF (congestive heart failure) (HCC)    Diabetes mellitus without complication (HCC)    Edema    . EF 65-70%, echo, November 29, 2010   Ejection fraction    EF 65%, echo, January, 2012   Hypertension    Hypokalemia    October, 2012   Murmur    No significant valvular disease by echo, January, 2012   Pacemaker    January, 2013 Medtronic Sensa   Pulmonary hypertension (HCC)     46 mmHg... echo... January, 2012    ROS:   All systems reviewed and negative except as noted  in the HPI.   Past Surgical History:  Procedure Laterality Date   CHOLECYSTECTOMY     HIP ARTHROPLASTY Right    INSERT / REPLACE / REMOVE PACEMAKER  11/2011   PARTIAL HYSTERECTOMY     vaginal   PERMANENT PACEMAKER INSERTION N/A 12/09/2011   Procedure: PERMANENT PACEMAKER INSERTION;  Surgeon: Marinus Maw, MD;  Location: [facility] CATH LAB;  Service: Cardiovascular;  Laterality: N/A;     Family History  Problem Relation Age of Onset   Cardiomyopathy Father    Diabetes Father    Heart failure Father    Dementia Mother    Cancer Son    Coronary artery disease Son    Heart attack Neg Hx      Social History   Socioeconomic History   Marital status: Widowed    Spouse name: Not on file   Number of children: Not on file   Years of education: Not on file   Highest education level: Not on file  Occupational History   Occupation: Retired    Associate Professor: RETIRED   Tobacco Use   Smoking status: Never   Smokeless tobacco: Never  Vaping Use   Vaping Use: Never used  Substance and Sexual Activity   Alcohol use: No   Drug use: No   Sexual activity: Never  Other Topics Concern   Not on file  Social History Narrative   Retired    Widowed    Tobacco Use - No.    Alcohol Use - no   Regular Exercise - no   Drug Use - no   Social Determinants of Corporate investment banker Strain: Not on file  Food Insecurity: Not on file  Transportation Needs: Not on file  Physical Activity: Not on file  Stress: Not on file  Social Connections: Not on file  Intimate Partner Violence: Not on file     BP 130/84    Pulse (!) 59    Ht 4\' 7"  (1.397 m)    Wt 214 lb (97.1 kg)    SpO2 93%    BMI 49.74 kg/m   Physical Exam:  Well appearing NAD HEENT: Unremarkable Neck:  No JVD, no thyromegally Lymphatics:  No adenopathy Back:  No CVA tenderness Lungs:  Clear HEART:  Regular rate rhythm, no murmurs, no rubs, no clicks Abd:  soft, positive bowel sounds, no organomegally, no rebound, no guarding Ext:  2 plus pulses, no edema, no cyanosis, no clubbing Skin:  No rashes no nodules Neuro:  CN II through XII intact, motor grossly intact  EKG  DEVICE  Normal device function.  See PaceArt for details.   Assess/Plan:  1. CHB -she is asymptomatic, s/p PPM insertion. 2. PPM - her Medtronic DDD PM is at [facility]. She will undergo PM gen change out. 3. HTN - her blood pressure is well controlled. She will continue her current meds. 4. Chronic diastolic heart failure - her lasix dose is stable and she will maintain a low sodium diet.   [facility] Camron Essman,MD

## 2021-12-11 LAB — COMPREHENSIVE METABOLIC PANEL
ALT: 16 IU/L (ref 0–32)
AST: 31 IU/L (ref 0–40)
Albumin/Globulin Ratio: 1.2 (ref 1.2–2.2)
Albumin: 4.1 g/dL (ref 3.6–4.6)
Alkaline Phosphatase: 106 IU/L (ref 44–121)
BUN/Creatinine Ratio: 29 — ABNORMAL HIGH (ref 12–28)
BUN: 34 mg/dL — ABNORMAL HIGH (ref 8–27)
Bilirubin Total: <0.2 mg/dL (ref 0.0–1.2)
CO2: 28 mmol/L (ref 20–29)
Calcium: 10.1 mg/dL (ref 8.7–10.3)
Chloride: 98 mmol/L (ref 96–106)
Creatinine, Ser: 1.18 mg/dL — ABNORMAL HIGH (ref 0.57–1.00)
Globulin, Total: 3.4 g/dL (ref 1.5–4.5)
Glucose: 119 mg/dL — ABNORMAL HIGH (ref 70–99)
Potassium: 4.7 mmol/L (ref 3.5–5.2)
Sodium: 140 mmol/L (ref 134–144)
Total Protein: 7.5 g/dL (ref 6.0–8.5)
eGFR: 44 mL/min/{1.73_m2} — ABNORMAL LOW (ref >59)

## 2021-12-11 LAB — CBC
Hematocrit: 36.9 % (ref 34.0–46.6)
Hemoglobin: 12.2 g/dL (ref 11.1–15.9)
MCH: 29.5 pg (ref 26.6–33.0)
MCHC: 33.1 g/dL (ref 31.5–35.7)
MCV: 89 fL (ref 79–97)
Platelets: 290 10*3/uL (ref 150–450)
RBC: 4.14 x10E6/uL (ref 3.77–5.28)
RDW: 13 % (ref 11.7–15.4)
WBC: 6.7 10*3/uL (ref 3.4–10.8)

## 2021-12-11 LAB — PRO B NATRIURETIC PEPTIDE: NT-Pro BNP: 2239 pg/mL — ABNORMAL HIGH (ref 0–738)

## 2021-12-11 LAB — TSH: TSH: 3.14 u[IU]/mL (ref 0.450–4.500)

## 2021-12-13 ENCOUNTER — Telehealth: Payer: Self-pay | Admitting: *Deleted

## 2021-12-13 MED ORDER — METOLAZONE 2.5 MG PO TABS: ORAL_TABLET | ORAL | 3 refills | Status: DC

## 2021-12-13 NOTE — Telephone Encounter (Signed)
Patient returning call.

## 2021-12-13 NOTE — Telephone Encounter (Signed)
Left message for patient's daughter to call office. 

## 2021-12-13 NOTE — Telephone Encounter (Signed)
I spoke with patient's daughter and reviewed lab results with her.  She asks I call patient's facility to confirm medications.  Phone number is [phone]

## 2021-12-13 NOTE — Telephone Encounter (Signed)
Daughter of the patient returning call to [facility]

## 2021-12-13 NOTE — Telephone Encounter (Signed)
I spoke with [name] at [facility].  Patient is taking furosemide and metolazone as listed.  They will increase metolazone to 2.5 mg by mouth 4 days per week.  Facility will draw BNP and BMP in one week.  Labs faxed to [facility] [phone]) per request.

## 2021-12-13 NOTE — Telephone Encounter (Signed)
I returned call but mailbox is full

## 2021-12-13 NOTE — Telephone Encounter (Signed)
-----   Message from Corky Crafts, MD sent at 12/12/2021  9:33 PM EST ----- Increased fluid level.  THis may be the cause of the cough.  Would verify that she is getting the Furosemide and metolazone at the dose that we have listed.  If she is, would increase the metolazone to 4 days a week

## 2021-12-21 ENCOUNTER — Telehealth: Payer: Self-pay | Admitting: Interventional Cardiology

## 2021-12-21 NOTE — Telephone Encounter (Signed)
Called and spoke to Telford (pt's daughter). [name] had called to inform us that the patient has an infection on her toe and began 7 day course of antibiotics yesterday. Abx will be complete on 2/6, but wanted to be sure this would not prohibit her mom from getting the battery change on her device 2/13. Pt is currently in a SNF and can see a podiatrist if necessary or staff can verify condition of toe once abx round is done if MD prefers. Routing to MD and nurse.

## 2021-12-21 NOTE — Telephone Encounter (Signed)
Patient's daughter called she would like to speak to nurse about her upcoming surgery on 01/03/22.

## 2021-12-23 ENCOUNTER — Telehealth: Payer: Self-pay

## 2021-12-23 NOTE — Telephone Encounter (Signed)
Patient's daughter calling back to follow up. She says she needs an answer as soon as possible for transportation.

## 2021-12-23 NOTE — Telephone Encounter (Signed)
Per Dr. Junie Spencer to proceed with gen change.  Advised daughter.  Confirmed arrival time.  Advised facility has a copy of the patient's instructions.

## 2021-12-31 ENCOUNTER — Telehealth: Payer: Self-pay | Admitting: *Deleted

## 2021-12-31 NOTE — Telephone Encounter (Signed)
Called to see if patient could be at the hospital Monday morning at 7:30am for a 9:30am procedure due to schedule changes.  Unfortunately the patient is at a facility and the transportation service has gone for the day.  Will leave her at her current time.

## 2021-12-31 NOTE — Pre-Procedure Instructions (Signed)
Spoke with Presenter, broadcasting for [facility].  Confirmed instructions with her.  Instructed patient on the following items: Arrival time 1200 Nothing to eat or drink after midnight No meds AM of procedure Responsible person to drive you home and stay with you for 24 hrs Wash with special soap night before and morning of procedure

## 2022-01-03 ENCOUNTER — Other Ambulatory Visit: Payer: Self-pay

## 2022-01-03 ENCOUNTER — Ambulatory Visit (HOSPITAL_COMMUNITY)
Admission: RE | Admit: 2022-01-03 | Discharge: 2022-01-04 | Disposition: A | Discharge status: Skilled Nursing Facility | Payer: Medicare Other | Attending: Internal Medicine | Admitting: Internal Medicine

## 2022-01-03 ENCOUNTER — Encounter (HOSPITAL_COMMUNITY)
Admission: RE | Disposition: A | Discharge status: Skilled Nursing Facility | Payer: Self-pay | Source: Home / Self Care | Attending: Internal Medicine

## 2022-01-03 DIAGNOSIS — I442 Atrioventricular block, complete: Secondary | ICD-10-CM | POA: Diagnosis present

## 2022-01-03 DIAGNOSIS — Z7984 Long term (current) use of oral hypoglycemic drugs: Secondary | ICD-10-CM | POA: Diagnosis not present

## 2022-01-03 DIAGNOSIS — I5032 Chronic diastolic (congestive) heart failure: Secondary | ICD-10-CM | POA: Diagnosis not present

## 2022-01-03 DIAGNOSIS — Z45010 Encounter for checking and testing of cardiac pacemaker pulse generator [battery]: Secondary | ICD-10-CM | POA: Diagnosis present

## 2022-01-03 DIAGNOSIS — E119 Type 2 diabetes mellitus without complications: Secondary | ICD-10-CM | POA: Diagnosis not present

## 2022-01-03 DIAGNOSIS — Z993 Dependence on wheelchair: Secondary | ICD-10-CM | POA: Diagnosis not present

## 2022-01-03 DIAGNOSIS — I110 Hypertensive heart disease with heart failure: Secondary | ICD-10-CM | POA: Diagnosis not present

## 2022-01-03 DIAGNOSIS — Z20822 Contact with and (suspected) exposure to covid-19: Secondary | ICD-10-CM | POA: Diagnosis not present

## 2022-01-03 HISTORY — PX: PPM GENERATOR CHANGEOUT: EP1233

## 2022-01-03 LAB — GLUCOSE, CAPILLARY
Glucose-Capillary: 94 mg/dL (ref 70–99)
Glucose-Capillary: 79 mg/dL (ref 70–99)
Glucose-Capillary: 87 mg/dL (ref 70–99)

## 2022-01-03 LAB — SARS CORONAVIRUS 2 BY RT PCR (HOSPITAL ORDER, PERFORMED IN ~~LOC~~ HOSPITAL LAB): SARS Coronavirus 2: NEGATIVE

## 2022-01-03 SURGERY — PPM GENERATOR CHANGEOUT

## 2022-01-03 MED ORDER — LIDOCAINE HCL (PF) 1 % IJ SOLN
INTRAMUSCULAR | Status: DC | PRN
  Administered 2022-01-03: 60 mL
  Administered 2022-01-03: 10 mL

## 2022-01-03 MED ORDER — POVIDONE-IODINE 10 % EX SWAB: 2.0000 "application " | Freq: Once | CUTANEOUS | Status: DC

## 2022-01-03 MED ORDER — ACETAMINOPHEN 325 MG PO TABS
325.0000 mg | ORAL_TABLET | ORAL | Status: DC | PRN
  Filled 2022-01-03: qty 2

## 2022-01-03 MED ORDER — CEFAZOLIN SODIUM-DEXTROSE 2-4 GM/100ML-% IV SOLN
INTRAVENOUS | Status: AC
  Filled 2022-01-03: qty 100

## 2022-01-03 MED ORDER — SODIUM CHLORIDE 0.9 % IV SOLN
80.0000 mg | INTRAVENOUS | Status: AC
  Administered 2022-01-03: 80 mg

## 2022-01-03 MED ORDER — CEFAZOLIN SODIUM-DEXTROSE 2-4 GM/100ML-% IV SOLN
2.0000 g | INTRAVENOUS | Status: AC
  Administered 2022-01-03: 2 g via INTRAVENOUS

## 2022-01-03 MED ORDER — LIDOCAINE HCL (PF) 1 % IJ SOLN
INTRAMUSCULAR | Status: AC
  Filled 2022-01-03: qty 60

## 2022-01-03 MED ORDER — CHLORHEXIDINE GLUCONATE 4 % EX LIQD: 4.0000 "application " | Freq: Once | CUTANEOUS | Status: DC

## 2022-01-03 MED ORDER — SODIUM CHLORIDE 0.9 % IV SOLN
INTRAVENOUS | Status: AC
  Filled 2022-01-03: qty 2

## 2022-01-03 MED ORDER — MUPIROCIN 2 % EX OINT
1.0000 "application " | TOPICAL_OINTMENT | Freq: Once | CUTANEOUS | Status: DC
  Filled 2022-01-03: qty 22

## 2022-01-03 MED ORDER — SODIUM CHLORIDE 0.9 % IV SOLN: INTRAVENOUS | Status: DC

## 2022-01-03 MED ORDER — ONDANSETRON HCL 4 MG/2ML IJ SOLN: 4.0000 mg | Freq: Four times a day (QID) | INTRAMUSCULAR | Status: DC | PRN

## 2022-01-03 SURGICAL SUPPLY — 7 items
CABLE SURGICAL S-101-97-12 (CABLE) ×2 IMPLANT
IPG PACE AZUR XT DR MRI W1DR01 (Pacemaker) IMPLANT
KIT MICROPUNCTURE NIT STIFF (SHEATH) ×1 IMPLANT
PACE AZURE XT DR MRI W1DR01 (Pacemaker) ×2 IMPLANT
PAD DEFIB RADIO PHYSIO CONN (PAD) ×2 IMPLANT
SHEATH PINNACLE 6F 10CM (SHEATH) ×1 IMPLANT
TRAY PACEMAKER INSERTION (PACKS) ×2 IMPLANT

## 2022-01-03 NOTE — Progress Notes (Signed)
Site area: Right groin a 6 french venous sheath was removed  Site Prior to Removal:  Level 0  Pressure Applied For 15 MINUTES    Bedrest Beginning at 1645pm X 2  Manual:   Yes.    Patient Status During Pull:  stable  Post Pull Groin Site:  Level 0  Post Pull Instructions Given:  Yes.    Post Pull Pulses Present:  Yes.    Dressing Applied:  Yes.    Comments:

## 2022-01-03 NOTE — Interval H&P Note (Signed)
History and Physical Interval Note:  01/03/2022 12:37 PM  [name]  has presented today for surgery, with the diagnosis of ERI.  The various methods of treatment have been discussed with the patient and family. After consideration of risks, benefits and other options for treatment, the patient has consented to  Procedure(s): PPM GENERATOR CHANGEOUT (N/A) as a surgical intervention.  The patient's history has been reviewed, patient examined, no change in status, stable for surgery.  I have reviewed the patient's chart and labs.  Questions were answered to the patient's satisfaction.     [name]

## 2022-01-03 NOTE — Progress Notes (Signed)
Pt surgical site at the the right sight of chest is clean, dry, and intact. No swelling and no hematoma observed.

## 2022-01-04 ENCOUNTER — Encounter (HOSPITAL_COMMUNITY): Payer: Self-pay | Admitting: Internal Medicine

## 2022-01-04 DIAGNOSIS — Z45010 Encounter for checking and testing of cardiac pacemaker pulse generator [battery]: Secondary | ICD-10-CM | POA: Diagnosis not present

## 2022-01-04 DIAGNOSIS — I442 Atrioventricular block, complete: Secondary | ICD-10-CM

## 2022-01-04 LAB — GLUCOSE, CAPILLARY: Glucose-Capillary: 84 mg/dL (ref 70–99)

## 2022-01-04 MED ORDER — HYDROMORPHONE HCL 1 MG/ML IJ SOLN
0.5000 mg | Freq: Once | INTRAMUSCULAR | Status: AC
  Administered 2022-01-04: 0.5 mg via INTRAVENOUS
  Filled 2022-01-04: qty 1

## 2022-01-04 MED ORDER — OXYCODONE HCL 5 MG PO TABS
2.5000 mg | ORAL_TABLET | Freq: Once | ORAL | Status: AC
  Administered 2022-01-04: 2.5 mg via ORAL
  Filled 2022-01-04: qty 1

## 2022-01-04 MED ORDER — MELATONIN 3 MG PO TABS
3.0000 mg | ORAL_TABLET | Freq: Every day | ORAL | Status: DC
  Administered 2022-01-04: 3 mg via ORAL
  Filled 2022-01-04 (×2): qty 1

## 2022-01-04 NOTE — NC FL2 (Signed)
[place] MEDICAID FL2 LEVEL OF CARE SCREENING TOOL     IDENTIFICATION  Patient Name: [name] Birthdate: 07-23-1931 Sex: female Admission Date (Current Location): 01/03/2022  [facility] and [place] Number:  Producer, television/film/video and Address:  [facility] [place], [address] [place], [place] [address]      Provider Number: [id number]  Attending Physician Name and Address:  Marinus Maw, MD  Relative Name and Phone Number:  Greggory Stallion [phone]    Current Level of Care: Hospital Recommended Level of Care: Skilled Nursing Facility Prior Approval Number:    Date Approved/Denied:   PASRR Number:    Discharge Plan: SNF    Current Diagnoses: Patient Active Problem List   Diagnosis Date Noted   Complete heart block (HCC) 01/03/2022   Acute on chronic diastolic CHF (congestive heart failure) (HCC) 11/24/2016   Acute encephalopathy 12/31/2015   Hypoglycemia 12/31/2015   Chest pain 08/12/2015   Chronic edema 04/30/2015   Elevated troponin 04/30/2015   Peripheral edema 04/30/2015   Nocturnal leg cramps 04/06/2015   Pressure in chest 01/13/2015   Generalized weakness 08/06/2014   Protein-calorie malnutrition, moderate (HCC) 08/06/2014   Osteoarthritis 04/29/2014   Anxiety 04/28/2014   Neck pain 04/28/2014   Weakness 04/21/2014   FTT (failure to thrive) in adult 04/21/2014   Chronic diastolic CHF (congestive heart failure) (HCC)    Pacemaker    Hypertension 12/10/2011   Cardiac pacemaker 12/10/2011   Murmur    Hypokalemia    Ejection fraction    Edema    AV block    Cellulitis    Pulmonary hypertension (HCC)     Orientation RESPIRATION BLADDER Height & Weight     Self, Time, Situation, Place (WDL)  Normal Continent Weight: 214 lb (97.1 kg) Height:  4\' 7"  (139.7 cm)  BEHAVIORAL SYMPTOMS/MOOD NEUROLOGICAL BOWEL NUTRITION STATUS      Continent Diet (Please see discharge summary)  AMBULATORY STATUS COMMUNICATION OF NEEDS Skin      Verbally Other (Comment) (WDL)                       Personal Care Assistance Level of Assistance  Bathing, Feeding, Dressing Bathing Assistance: Limited assistance Feeding assistance: Independent (able to feed self) Dressing Assistance: Limited assistance     Functional Limitations Info  Sight, Hearing, Speech     Speech Info: Adequate    SPECIAL CARE FACTORS FREQUENCY                       Contractures Contractures Info: Not present    Additional Factors Info  Code Status, Allergies Code Status Info: FULL Allergies Info: Aspirin,Tramadol,Naproxen,Shellfish-derived Products           Current Medications (01/04/2022):  This is the current hospital active medication list Current Facility-Administered Medications  Medication Dose Route Frequency Provider Last Rate Last Admin   acetaminophen (TYLENOL) tablet 325-650 mg  325-650 mg Oral Q4H PRN 01/06/2022, MD       melatonin tablet 3 mg  3 mg Oral QHS Marinus Maw, MD   3 mg at 01/04/22 0200   ondansetron (ZOFRAN) injection 4 mg  4 mg Intravenous Q6H PRN 01/06/22, MD         Discharge Medications: Please see discharge summary for a list of discharge medications.  Relevant Imaging Results:  Relevant Lab Results:   Additional Information [id number]  01-26-1972, LCSWA

## 2022-01-04 NOTE — Discharge Instructions (Signed)
Post procedure wound care instructions Keep incision clean and dry for 10 days. Leave steri-strips (little pieces of tape) on until seen in the office for wound check appointment. Call the office [phone]) for redness, drainage, swelling, or fever.

## 2022-01-04 NOTE — Progress Notes (Addendum)
RN attempted to call to give report to receiving facility. Will try again   Second attempt made, secretary at facility tried to connect me to receiving RN but no one answered. Will try again    Called facility again but could bot reach anyone. [facility] here to transport pt. AVS documentation printed and sent with transport team.

## 2022-01-04 NOTE — Progress Notes (Signed)
Patient called out stating that she is having 7/10 pain on her chest where they accessed her pacemaker. Paged on call MD regarding patient's home medication and MD ordered 2.5 mg oxycodone. Patient called out again after 2 hours, stating that she is still in pain paged on call MD about patient's pain level. Patient called out after 15 minutes and pain level has gone up from 7 to a 9/10. Sent another page to on call MD. Still awaiting orders.

## 2022-01-04 NOTE — TOC Transition Note (Signed)
Transition of Care [facility]) - CM/SW Discharge Note   Patient Details  Name: [name] MRN: [id number] Date of Birth: Oct 01, 1931  Transition of Care [facility]) CM/SW Contact:  Milas Gain, [place] Phone Number: 01/04/2022, 11:28 AM   Clinical Narrative:     CSW spoke with patient and patients daughter Basilia Jumbo who confirmed plan is for patient to return back to [facility]. Patient comes from [facility] long term. Patient and Basilia Jumbo confirmed for patient to transport by [facility].  Patient will DC to: [facility]  Anticipated DC date: 01/04/2022  Family notified: Basilia Jumbo  Transport by: Corey Harold  ?  Per MD patient ready for DC to [facility] . RN, patient, patient's family, and facility notified of DC. Discharge Summary sent to facility. RN given number for report [phone] RM# [id number] packet on chart. Ambulance transport requested for patient.  CSW signing off.   Final next level of care: Skilled Nursing Facility Barriers to Discharge: No Barriers Identified   Patient Goals and CMS Choice Patient states their goals for this hospitalization and ongoing recovery are:: SNF CMS Medicare.gov Compare Post Acute Care list provided to:: Patient Choice offered to / list presented to : Patient  Discharge Placement              Patient chooses bed at: [facility] Patient to be transferred to facility by: [place] Name of family member notified: [name] Patient and family notified of of transfer: 01/04/22  Discharge Plan and Services                                     Social Determinants of Health (SDOH) Interventions     Readmission Risk Interventions No flowsheet data found.

## 2022-01-04 NOTE — Discharge Summary (Addendum)
ELECTROPHYSIOLOGY PROCEDURE DISCHARGE SUMMARY    Patient ID: [name],  MRN: [id number], DOB/AGE: 86-Mar-1932 86 y.o.  Admit date: 01/03/2022 Discharge date: 01/04/2022  Primary Care Physician: System, Provider Not In  Primary Cardiologist: Dr. Eldridge Dace Electrophysiologist: Dr. Ladona Ridgel  Primary Discharge Diagnosis:  PPM battery depletion PPM generator replacement  Secondary Discharge Diagnosis:  DM HTN Wheelchair bound (for many years)   Allergies  Allergen Reactions   Aspirin Rash and Other (See Comments)    GI distress   Tramadol Other (See Comments)    GI distress   Naproxen    Shellfish-Derived Products Itching and Rash     Procedures This Admission:  1.  PPM generator change 01/03/22.   Brief HPI: [name] is a 88 y.o. female is followed out patient by Dr. Ladona Ridgel for her PPM management, battery depletion noted to have reached ERI and planned for genrator change procedure. The patient was an extremely hard IV stick and after multiple attempts opted for a R femoral central line for IV access for her procedure.  Hospital Course:  The patient underwent her pacemaker generator change without complication, though given central line site decided to admit to monitor vein site overnight prior to returning to her SNF. She was monitored on telemetry overnight which demonstrated SR/ v paced rhythm, occ PACs.  Left chest was without hematoma or ecchymosis.  L groin is sift, nontender, no bleeding or hematoma Wound care was reviewed with the patient.  The patient has chronic hip pain, reported mild-mod pacer site pain denies any CP/SOB.  She was examined by Dr. Ladona Ridgel and considered stable for discharge to home.    Physical Exam: Vitals:   01/03/22 1823 01/03/22 1840 01/03/22 2007 01/04/22 0342  BP: (!) 151/89 (!) 144/63 (!) 125/47 127/63  Pulse: 80 82 79 87  Resp: 18  18   Temp: 97.8 F (36.6 C)  98.2 F (36.8 C) 98.5 F (36.9 C)  TempSrc: Oral  Oral  Oral  SpO2:  100% 99% 98%  Weight:      Height:        GEN- The patient is chronically ill appearing, alert and oriented x 3 today.   HEENT: normocephalic, atraumatic; sclera clear, conjunctiva pink; hearing intact; oropharynx clear; neck supple, no JVP Lungs- CTA b/l, normal work of breathing.  No wheezes, rales, rhonchi Heart- RRR, no murmurs, rubs or gallops, PMI not laterally displaced GI- soft, non-tender, non-distended Extremities- no clubbing, cyanosis, brawny edema MS- no significant deformity or atrophy Skin- warm and dry, no rash or lesion, left chest without hematoma/ecchymosis Psych- euthymic mood, full affect Neuro- no gross deficits   Labs:   Lab Results  Component Value Date   WBC 6.7 12/10/2021   HGB 12.2 12/10/2021   HCT 36.9 12/10/2021   MCV 89 12/10/2021   PLT 290 12/10/2021   No results for input(s): NA, K, CL, CO2, BUN, CREATININE, CALCIUM, PROT, BILITOT, ALKPHOS, ALT, AST, GLUCOSE in the last 168 hours.  Invalid input(s): LABALBU  Discharge Medications:  Allergies as of 01/04/2022       Reactions   Aspirin Rash, Other (See Comments)   GI distress   Tramadol Other (See Comments)   GI distress   Naproxen    Shellfish-derived Products Itching, Rash        Medication List     TAKE these medications    BIOFREEZE COLORLESS EX Apply 1 application topically 2 (two) times daily.   Biotin 5000 MCG Caps  Take 5,000 mcg by mouth daily. (0900)   carvedilol 6.25 MG tablet Commonly known as: COREG Take 6.25 mg by mouth 2 (two) times daily with a meal. (0900 & 1700)   CERTA-VITE PO Take 1 tablet by mouth daily. (0900)   furosemide 80 MG tablet Commonly known as: LASIX Take 80 mg by mouth in the morning. (0900)   gabapentin 300 MG capsule Commonly known as: NEURONTIN Take 300 mg by mouth 2 (two) times daily. (0900 & 2100)   HYDROcodone-acetaminophen 5-325 MG tablet Commonly known as: NORCO/VICODIN Take 1 tablet by mouth every 6 (six) hours  as needed (pain).   loratadine 10 MG tablet Commonly known as: CLARITIN Take 10 mg by mouth in the morning. (0900)   magnesium oxide 400 (241.3 Mg) MG tablet Commonly known as: MAG-OX TAKE ONE TABLET BY MOUTH ONE TIME DAILY   melatonin 3 MG Tabs tablet Take 3 mg by mouth at bedtime. (2100)   metolazone 2.5 MG tablet Commonly known as: ZAROXOLYN Take one tablet by mouth 4 days a week.  Take 30 minutes prior to furosemide What changed: additional instructions   nitroGLYCERIN 0.4 MG SL tablet Commonly known as: NITROSTAT Place 0.4 mg under the tongue every 5 (five) minutes as needed for chest pain (X 3 DOSES FOR CHEST PAIN).   polyethylene glycol powder 17 GM/SCOOP powder Commonly known as: GLYCOLAX/MIRALAX Take 17 g by mouth daily. (0900)   potassium chloride 10 MEQ tablet Commonly known as: KLOR-CON Take 40 mEq by mouth 2 (two) times daily. (0900 & 2100)   sitaGLIPtin 50 MG tablet Commonly known as: JANUVIA Take 50 mg by mouth in the morning. (0900)   vitamin A & D ointment Apply 1 application topically every 6 (six) hours as needed for dry skin (protection).   Vitamin D 50 MCG (2000 UT) Caps Take 2,000 Units by mouth in the morning. (0900)       Disposition: SNF   Duration of Discharge Encounter: Greater than 30 minutes including physician time.  Norma Fredrickson, PA-C 01/04/2022 9:44 AM  EP Attending  Patient seen and examined. Agree with above. The patient is stable over night. Her right femoral venous central line has been removed and no hematoma. Her PM incision looks good and tele demonstrates normal DDD PM function. Usual followup.   Sharlot Gowda Bren Steers,MD

## 2022-01-13 ENCOUNTER — Ambulatory Visit (INDEPENDENT_AMBULATORY_CARE_PROVIDER_SITE_OTHER): Payer: Medicare Other

## 2022-01-13 ENCOUNTER — Other Ambulatory Visit: Payer: Self-pay

## 2022-01-13 DIAGNOSIS — I442 Atrioventricular block, complete: Secondary | ICD-10-CM | POA: Diagnosis not present

## 2022-01-13 LAB — CUP PACEART INCLINIC DEVICE CHECK
Battery Remaining Longevity: 144 mo
Battery Voltage: 3.22 V
Brady Statistic AP VP Percent: 6.8 %
Brady Statistic AP VS Percent: 0 %
Brady Statistic AS VP Percent: 93.19 %
Brady Statistic AS VS Percent: 0 %
Brady Statistic RA Percent Paced: 6.79 %
Brady Statistic RV Percent Paced: 100 %
Date Time Interrogation Session: 20230223162327
Implantable Lead Implant Date: 20130118
Implantable Lead Implant Date: 20130118
Implantable Lead Location: 753859
Implantable Lead Location: 753860
Implantable Lead Model: 5076
Implantable Lead Model: 5076
Implantable Pulse Generator Implant Date: 20230213
Lead Channel Impedance Value: 285 Ohm
Lead Channel Impedance Value: 380 Ohm
Lead Channel Impedance Value: 399 Ohm
Lead Channel Impedance Value: 475 Ohm
Lead Channel Pacing Threshold Amplitude: 0.875 V
Lead Channel Pacing Threshold Amplitude: 1.25 V
Lead Channel Pacing Threshold Pulse Width: 0.4 ms
Lead Channel Pacing Threshold Pulse Width: 0.4 ms
Lead Channel Sensing Intrinsic Amplitude: 0.875 mV
Lead Channel Sensing Intrinsic Amplitude: 1.125 mV
Lead Channel Setting Pacing Amplitude: 2 V
Lead Channel Setting Pacing Amplitude: 2.5 V
Lead Channel Setting Pacing Pulse Width: 0.4 ms
Lead Channel Setting Sensing Sensitivity: 2.8 mV

## 2022-01-13 NOTE — Patient Instructions (Addendum)
° °  After Your Pacemaker   Monitor your pacemaker site for redness, swelling, and drainage. Call the device clinic at [phone] if you experience these symptoms or fever/chills.  Your incision was closed with Steri-strips or staples:  You may shower 7 days after your procedure and wash your incision with soap and water. Avoid lotions, ointments, or perfumes over your incision until it is well-healed.  You may use a hot tub or a pool after your wound check appointment if the incision is completely closed.   You may drive, unless driving has been restricted by your healthcare providers.   Discussed left hand/wrist swelling with Dr. Elberta Fortis and he would like for you to follow up with your primary care provider. He does not think it is related to you generator change out.

## 2022-01-13 NOTE — Progress Notes (Signed)

## 2022-01-17 ENCOUNTER — Telehealth: Payer: Self-pay | Admitting: Internal Medicine

## 2022-01-17 NOTE — Telephone Encounter (Signed)
[facility] called in and is needing office notes for 2/23 appt with Dr Verdon Cummins [facility] number [phone]

## 2022-01-17 NOTE — Telephone Encounter (Signed)
Wound check note faxed as requested.

## 2022-01-31 NOTE — Progress Notes (Signed)
RETHEL, WEHE ([id number]) Visit Report for 02/01/2022 Allergy List Details Patient Name: Date of Service: [name], [name] 02/01/2022 1:15 PM Medical Record Number: [id number] Patient Account Number: [id number] Date of Birth/Sex: Treating RN: 01-Jan-1931 (88 y.o. F) Primary Care Boaz Berisha: Other Clinician: Referring Marianna Cid: Treating Ashaya Raftery/Extender: Jadene Pierini in Treatment: 0 Allergies Active Allergies aspirin Reaction: Rash, GI Distress Severity: Severe tramadol Reaction: Intolerance Severity: Severe Active: 09/03/2014 naproxen Severity: Severe Active: 11/24/2016 shellfish derived Reaction: itching,rash Severity: Mild Active: 12/10/2021 Allergy Notes Electronic Signature(s) Signed: 01/31/2022 6:31:55 PM By: Donavan Burnet CHT EMT BS , , Entered By: Donavan Burnet on 01/31/2022 18:31:55

## 2022-02-01 ENCOUNTER — Other Ambulatory Visit: Payer: Self-pay

## 2022-02-01 ENCOUNTER — Encounter (HOSPITAL_BASED_OUTPATIENT_CLINIC_OR_DEPARTMENT_OTHER): Payer: Medicare Other | Attending: General Surgery | Admitting: General Surgery

## 2022-02-01 DIAGNOSIS — I5032 Chronic diastolic (congestive) heart failure: Secondary | ICD-10-CM | POA: Insufficient documentation

## 2022-02-01 DIAGNOSIS — I110 Hypertensive heart disease with heart failure: Secondary | ICD-10-CM | POA: Diagnosis not present

## 2022-02-01 DIAGNOSIS — L97522 Non-pressure chronic ulcer of other part of left foot with fat layer exposed: Secondary | ICD-10-CM | POA: Insufficient documentation

## 2022-02-01 DIAGNOSIS — I272 Pulmonary hypertension, unspecified: Secondary | ICD-10-CM | POA: Diagnosis not present

## 2022-02-01 DIAGNOSIS — E11621 Type 2 diabetes mellitus with foot ulcer: Secondary | ICD-10-CM | POA: Insufficient documentation

## 2022-02-02 NOTE — Progress Notes (Signed)
[name] ([id number]) ?Visit Report for 02/01/2022 ?Abuse Risk Screen Details ?Patient Name: Date of Service: ?[name], [name] 02/01/2022 1:15 PM ?Medical Record Number: [id number] ?Patient Account Number: [id number] ?Date of Birth/Sex: Treating RN: ?[birth year] (88 y.o. [name], [name] ?Primary Care Sarajane Fambrough: SYSTEM, PRO V IDER Other Clinician: ?Referring Takya Vandivier: ?Treating Laith Antonelli/Extender: Fredirick Maudlin ?Weeks in Treatment: 0 ?Abuse Risk Screen Items ?Answer ?ABUSE RISK SCREEN: ?Has anyone close to you tried to hurt or harm you recentlyo No ?Do you feel uncomfortable with anyone in your familyo No ?Has anyone forced you do things that you didnt want to doo No ?Electronic Signature(s) ?Signed: 02/02/2022 6:02:41 PM By: Levan Hurst RN, BSN ?Entered By: Levan Hurst on 02/01/2022 14:01:55 ?-------------------------------------------------------------------------------- ?Activities of Daily Living Details ?Patient Name: Date of Service: ?[name], [name] 02/01/2022 1:15 PM ?Medical Record Number: [id number] ?Patient Account Number: [id number] ?Date of Birth/Sex: Treating RN: ?1931/03/29 (88 y.o. [name], [name] ?Primary Care Maigen Mozingo: SYSTEM, PRO V IDER Other Clinician: ?Referring Ayman Brull: ?Treating Takira Sherrin/Extender: Fredirick Maudlin ?Weeks in Treatment: 0 ?Activities of Daily Living Items ?Answer ?Activities of Daily Living (Please select one for each item) ?Drive Automobile Not Able ?T Medications ?ake Completely Able ?Use T elephone Completely Able ?Care for Appearance Need Assistance ?Use T oilet Need Assistance ?Bath / Shower Need Assistance ?Dress Self Need Assistance ?Feed Self Completely Able ?Walk Not Able ?Get In / Out Bed Need Assistance ?Housework Not Able ?Prepare Meals Need Assistance ?Handle Money Need Assistance ?Shop for Self Need Assistance ?Electronic Signature(s) ?Signed: 02/02/2022 6:02:41 PM By: Levan Hurst RN, BSN ?Entered By: Levan Hurst on 02/01/2022  14:02:41 ?-------------------------------------------------------------------------------- ?Education Screening Details ?Patient Name: ?Date of Service: ?[name], [name] 02/01/2022 1:15 PM ?Medical Record Number: [id number] ?Patient Account Number: [id number] ?Date of Birth/Sex: ?Treating RN: ?1931-07-30 (88 y.o. [name], [name] ?Primary Care Kelven Flater: SYSTEM, PRO V IDER ?Other Clinician: ?Referring Roderic Lammert: ?Treating Codie Hainer/Extender: Fredirick Maudlin ?Weeks in Treatment: 0 ?Primary Learner Assessed: Patient ?Learning Preferences/Education Level/Primary Language ?Learning Preference: Explanation, Demonstration, Printed Material ?Preferred Language: English ?Cognitive Barrier ?Language Barrier: No ?Translator Needed: No ?Memory Deficit: No ?Emotional Barrier: No ?Cultural/Religious Beliefs Affecting Medical Care: No ?Physical Barrier ?Impaired Vision: No ?Impaired Hearing: No ?Decreased Hand dexterity: No ?Knowledge/Comprehension ?Knowledge Level: High ?Comprehension Level: High ?Ability to understand written instructions: High ?Ability to understand verbal instructions: High ?Motivation ?Anxiety Level: Calm ?Cooperation: Cooperative ?Education Importance: Acknowledges Need ?Interest in Health Problems: Asks Questions ?Perception: Coherent ?Willingness to Engage in Self-Management High ?Activities: ?Readiness to Engage in Self-Management High ?Activities: ?Electronic Signature(s) ?Signed: 02/02/2022 6:02:41 PM By: Levan Hurst RN, BSN ?Entered By: Levan Hurst on 02/01/2022 14:02:58 ?-------------------------------------------------------------------------------- ?Fall Risk Assessment Details ?Patient Name: ?Date of Service: ?[name], [name] 02/01/2022 1:15 PM ?Medical Record Number: [id number] ?Patient Account Number: [id number] ?Date of Birth/Sex: ?Treating RN: ?Jan 09, 1931 (88 y.o. [name], [name] ?Primary Care Monti Jilek: SYSTEM, PRO V IDER ?Other Clinician: ?Referring Braydon Kullman: ?Treating  Shandell Jallow/Extender: Fredirick Maudlin ?Weeks in Treatment: 0 ?Fall Risk Assessment Items ?Have you had 2 or more falls in the last 12 monthso 0 No ?Have you had any fall that resulted in injury in the last 12 monthso 0 No ?FALLS RISK SCREEN ?History of falling - immediate or within 3 months 0 No ?Secondary diagnosis (Do you have 2 or more medical diagnoseso) 15 Yes ?Ambulatory aid ?None/bed rest/wheelchair/nurse 0 Yes ?Crutches/cane/walker 0 No ?Furniture 0 No ?Intravenous therapy Access/Saline/Heparin Lock 0 No ?Gait/Transferring ?Normal/ bed rest/ wheelchair 0 Yes ?Weak (short steps with or without shuffle, stooped but  able to lift head while walking, may seek 0 No ?support from furniture) ?Impaired (short steps with shuffle, may have difficulty arising from chair, head down, impaired 0 No ?balance) ?Mental Status ?Oriented to own ability 0 Yes ?Electronic Signature(s) ?Signed: 02/02/2022 6:02:41 PM By: Levan Hurst RN, BSN ?Entered By: Levan Hurst on 02/01/2022 14:03:58 ?-------------------------------------------------------------------------------- ?Foot Assessment Details ?Patient Name: ?Date of Service: ?[name], [name] 02/01/2022 1:15 PM ?Medical Record Number: [id number] ?Patient Account Number: [id number] ?Date of Birth/Sex: ?Treating RN: ?11-13-1931 (88 y.o. [name], [name] ?Primary Care Nicola Quesnell: SYSTEM, PRO V IDER ?Other Clinician: ?Referring Glory Graefe: ?Treating Hershel Corkery/Extender: Fredirick Maudlin ?Weeks in Treatment: 0 ?Foot Assessment Items ?Site Locations ?+ = Sensation present, - = Sensation absent, C = Callus, U = Ulcer ?R = Redness, W = Warmth, M = Maceration, PU = Pre-ulcerative lesion ?F = Fissure, S = Swelling, D = Dryness ?Assessment ?Right: Left: ?Other Deformity: No No ?Prior Foot Ulcer: No No ?Prior Amputation: No No ?Charcot Joint: No No ?Ambulatory Status: Non-ambulatory ?Assistance Device: Wheelchair ?Gait: Unsteady ?Electronic Signature(s) ?Signed: 02/02/2022 6:02:41 PM By: Levan Hurst RN, BSN ?Entered By: Levan Hurst on 02/01/2022 14:18:58 ?-------------------------------------------------------------------------------- ?Nutrition Risk Screening Details ?Patient Name: ?Date of Service: ?[name], [name] 02/01/2022 1:15 PM ?Medical Record Number: [id number] ?Patient Account Number: [id number] ?Date of Birth/Sex: ?Treating RN: ?1931-01-17 (88 y.o. [name], [name] ?Primary Care Kellie Chisolm: SYSTEM, PRO V IDER ?Other Clinician: ?Referring Anjeli Casad: ?Treating Charice Zuno/Extender: Fredirick Maudlin ?Weeks in Treatment: 0 ?Height (in): ?Weight (lbs): ?Body Mass Index (BMI): ?Nutrition Risk Screening Items ?Score Screening ?NUTRITION RISK SCREEN: ?I have an illness or condition that made me change the kind and/or amount of food I eat 2 Yes ?I eat fewer than two meals per day 0 No ?I eat few fruits and vegetables, or milk products 0 No ?I have three or more drinks of beer, liquor or wine almost every day 0 No ?I have tooth or mouth problems that make it hard for me to eat 0 No ?I don't always have enough money to buy the food I need 0 No ?I eat alone most of the time 0 No ?I take three or more different prescribed or over-the-counter drugs a day 1 Yes ?Without wanting to, I have lost or gained 10 pounds in the last six months 0 No ?I am not always physically able to shop, cook and/or feed myself 2 Yes ?Nutrition Protocols ?Good Risk Protocol ?Moderate Risk Protocol 0 Provide education on nutrition ?High Risk Proctocol ?Risk Level: Moderate Risk ?Score: 5 ?Electronic Signature(s) ?Signed: 02/02/2022 6:02:41 PM By: Levan Hurst RN, BSN ?Entered By: Levan Hurst on 02/01/2022 14:04:08 ?

## 2022-02-03 NOTE — Progress Notes (Signed)
[name], [name] ([id number]) ?Visit Report for 02/01/2022 ?Chief Complaint Document Details ?Patient Name: Date of Service: ?[name], [name] 02/01/2022 1:15 PM ?Medical Record Number: [id number] ?Patient Account Number: [id number] ?Date of Birth/Sex: Treating RN: ?05-12-1931 (88 y.o. F) ?Primary Care Provider: SYSTEM, PRO V IDER Other Clinician: ?Referring Provider: ?Treating Provider/Extender: Duanne [name] ?Weeks in Treatment: 0 ?Information Obtained from: Patient ?Chief Complaint ?Patient presents to the wound care center with open non-healing surgical wound(s) ?Electronic Signature(s) ?Signed: 02/03/2022 7:46:53 AM By: Duanne Guess MD FACS ?Entered By: Duanne [name] on 02/03/2022 07:46:53 ?-------------------------------------------------------------------------------- ?Debridement Details ?Patient Name: Date of Service: ?[name], [name] 02/01/2022 1:15 PM ?Medical Record Number: [id number] ?Patient Account Number: [id number] ?Date of Birth/Sex: Treating RN: ?1931-06-07 (88 y.o. [name] ?Primary Care Provider: SYSTEM, PRO V IDER Other Clinician: ?Referring Provider: ?Treating Provider/Extender: Duanne [name] ?Weeks in Treatment: 0 ?Debridement Performed for Assessment: Wound #1 Left T Great ?oe ?Performed By: Physician Duanne Guess, MD ?Debridement Type: Debridement ?Severity of Tissue Pre Debridement: Fat layer exposed ?Level of Consciousness (Pre-procedure): Awake and Alert ?Pre-procedure Verification/Time Out Yes - 14:26 ?Taken: ?Start Time: 14:26 ?T Area Debrided (L x W): ?otal 0.6 (cm) x 0.6 (cm) = 0.36 (cm?) ?Tissue and other material debrided: Non-Viable, Slough, Slough ?Level: Non-Viable Tissue ?Debridement Description: Selective/Open Wound ?Instrument: Curette ?Bleeding: Minimum ?Hemostasis Achieved: Pressure ?End Time: 14:29 ?Procedural Pain: 0 ?Post Procedural Pain: 0 ?Response to Treatment: Procedure was tolerated well ?Level of Consciousness (Post- Awake and  Alert ?procedure): ?Post Debridement Measurements of Total Wound ?Length: (cm) 0.6 ?Width: (cm) 0.6 ?Depth: (cm) 0.2 ?Volume: (cm?) 0.057 ?Character of Wound/Ulcer Post Debridement: Requires Further Debridement ?Severity of Tissue Post Debridement: Fat layer exposed ?Post Procedure Diagnosis ?Same as Pre-procedure ?Electronic Signature(s) ?Signed: 02/02/2022 6:02:41 PM By: Zandra Abts RN, BSN ?Signed: 02/03/2022 10:02:55 AM By: Duanne Guess MD FACS ?Entered By: Zandra Abts on 02/01/2022 14:32:52 ?-------------------------------------------------------------------------------- ?HPI Details ?Patient Name: Date of Service: ?[name], [name] 02/01/2022 1:15 PM ?Medical Record Number: [id number] ?Patient Account Number: [id number] ?Date of Birth/Sex: Treating RN: ?[birth year] (88 y.o. F) ?Primary Care Provider: SYSTEM, PRO V IDER Other Clinician: ?Referring Provider: ?Treating Provider/Extender: Duanne [name] ?Weeks in Treatment: 0 ?History of Present Illness ?HPI Description: ADMISSION ?02/01/2022 ?This is an 88 year old woman who is a resident of [place]. She has a past medical history notable for type 2 diabetes mellitus, congestive heart failure, ?pulmonary hypertension, and complete heart block. She is accompanied today by her son. Approximately 4 months ago, she had an ingrown toe nail removed. ?Apparently this went a bit deep. Since that time, it has not healed. The patient is nonambulatory. Apparently they have been applying Santyl to the site at her ?facility. She was on antibiotics but I am not certain which specific drugs she took. She completed her last course a few weeks ago. We were unable to obtain ?ABIs in clinic secondary to the patient's body habitus. She has not had any formal vascular studies nor any x-ray of the area. ?Electronic Signature(s) ?Signed: 02/03/2022 7:49:38 AM By: Duanne Guess MD FACS ?Entered By: Duanne [name] on 02/03/2022  07:49:38 ?-------------------------------------------------------------------------------- ?Physical Exam Details ?Patient Name: Date of Service: ?[name], [name] 02/01/2022 1:15 PM ?Medical Record Number: [id number] ?Patient Account Number: [id number] ?Date of Birth/Sex: Treating RN: ?July 07, 1931 (88 y.o. F) ?Primary Care Provider: SYSTEM, PRO V IDER Other Clinician: ?Referring Provider: ?Treating Provider/Extender: Duanne [name] ?Weeks in Treatment: 0 ?Constitutional ?. . . . No acute distress. ?Respiratory ?Normal work of breathing on room air.[name] ?  Cardiovascular ?3+ pitting edema to at least the knees bilaterally.[name] ?Notes ?02/01/2022: Wound examon the left great toe, there is thick slough and an area where the nail seems to be removed, but is very difficult to appreciate the ?wound bed at all. It is somewhat tender. ?Electronic Signature(s) ?Signed: 02/03/2022 7:50:44 AM By: Duanne Guess MD FACS ?Signed: 02/03/2022 7:50:44 AM By: Duanne Guess MD FACS ?Entered By: Duanne [name] on 02/03/2022 07:50:44 ?-------------------------------------------------------------------------------- ?Physician Orders Details ?Patient Name: ?Date of Service: ?[name], [name] 02/01/2022 1:15 PM ?Medical Record Number: [id number] ?Patient Account Number: [id number] ?Date of Birth/Sex: ?Treating RN: ?03-28-1931 (88 y.o. [name] ?Primary Care Provider: SYSTEM, PRO V IDER ?Other Clinician: ?Referring Provider: ?Treating Provider/Extender: Duanne [name] ?Weeks in Treatment: 0 ?Verbal / Phone Orders: No ?Diagnosis Coding ?ICD-10 Coding ?Code Description ?E11.621 Type 2 diabetes mellitus with foot ulcer ?E26.834 Non-pressure chronic ulcer of other part of left foot with fat layer exposed ?I50.32 Chronic diastolic (congestive) heart failure ?I27.20 Pulmonary hypertension, unspecified ?Follow-up Appointments ?ppointment in 1 week. - Dr. Lady Gary ?Return A ?Bathing/ Shower/ Hygiene ?May shower with protection but do not get  wound dressing(s) wet. ?Off-Loading ?Other: - No direct pressure on toes ?Wound Treatment ?Wound #1 - T Great ?oe Wound Laterality: Left ?Cleanser: Wound Cleanser 1 x Per Day ?Discharge Instructions: Cleanse the wound with wound cleanser prior to applying a clean dressing using gauze sponges, not tissue or cotton balls. ?Prim Dressing: Hydrofera Blue Classic Foam, 2x2 in 1 x Per Day ?ary ?Discharge Instructions: Moisten with saline prior to applying to wound bed, apply after Santyl ?Prim Dressing: Santyl Ointment 1 x Per Day ?ary ?Discharge Instructions: Apply nickel thick amount to wound bed, cover with Hydrofera Blue ?Secondary Dressing: Woven Gauze Sponges 2x2 in 1 x Per Day ?Discharge Instructions: Apply over primary dressing as directed. ?Secured With: Insurance underwriter, Sterile 2x75 (in/in) 1 x Per Day ?Discharge Instructions: Secure with stretch gauze as directed. ?Secured With: Paper Tape, 1x10 (in/yd) 1 x Per Day ?Discharge Instructions: Secure dressing with tape as directed. ?Radiology ?X-ray, left great toe - Non healing wound on left great toe ?Services and Therapies ?rterial Studies- Bilateral w/ABIs and TBIs - Non healing ulcer on left great toe, non palpable pulses in clinic ?A ?Electronic Signature(s) ?Signed: 02/03/2022 10:02:55 AM By: Duanne Guess MD FACS ?Previous Signature: 02/02/2022 6:02:41 PM Version By: Zandra Abts RN, BSN ?Entered By: Duanne [name] on 02/03/2022 07:51:12 ?Prescription 02/01/2022 ?-------------------------------------------------------------------------------- ?Mcnerney, Milani S. Duanne Guess MD ?Patient Name: Provider: ?09-03-1931 [id number] ?Date of Birth: NPI#: ?[id number] ?Sex: DEA #: ?[phone] [id number] ?Phone #: License #: ?Eligha Bridegroom [facility] ?Patient Address: ?Haematologist [facility] and [facility] [address] [place] ?[address] ?[place], [place] [address] [place], [place]  [address] ?[phone] ?Allergies ?aspirin; tramadol; naproxen; shellfish derived ?Provider's Orders ?X-ray, left great toe - Non healing wound on left great toe ?Hand Signature: ?Date(s): ?Prescription 02/01/2022 ?Kinkead, Kacee S. Duanne Guess MD ?Patient Name: Provider: ?12/25/1

## 2022-02-09 ENCOUNTER — Other Ambulatory Visit: Payer: Self-pay

## 2022-02-09 ENCOUNTER — Encounter (HOSPITAL_BASED_OUTPATIENT_CLINIC_OR_DEPARTMENT_OTHER): Payer: Medicare Other | Admitting: General Surgery

## 2022-02-09 DIAGNOSIS — E11621 Type 2 diabetes mellitus with foot ulcer: Secondary | ICD-10-CM | POA: Diagnosis not present

## 2022-02-09 NOTE — Progress Notes (Signed)
NATAJAH, DERDERIAN ([id number]) ?Visit Report for 02/09/2022 ?Arrival Information Details ?Patient Name: Date of Service: ?[name], [name] 02/09/2022 1:15 PM ?Medical Record Number: [id number] ?Patient Account Number: [id number] ?Date of Birth/Sex: Treating RN: ?[birth year] (88 y.o. F) Boehlein, Linda ?Primary Care Favor Kreh: SYSTEM, PRO V IDER Other Clinician: ?Referring Shaquasia Caponigro: ?Treating Saagar Tortorella/Extender: Duanne Guess ?Weeks in Treatment: 1 ?Visit Information History Since Last Visit ?Added or deleted any medications: No ?Patient Arrived: Wheel Chair ?Any new allergies or adverse reactions: No ?Arrival Time: 13:25 ?Had a fall or experienced change in No ?Accompanied By: son/caregiver ?activities of daily living that may affect ?Transfer Assistance: Manual ?risk of falls: ?Patient Identification Verified: Yes ?Signs or symptoms of abuse/neglect since last visito No ?Secondary Verification Process Completed: Yes ?Hospitalized since last visit: No ?Patient Has Alerts: Yes ?Implantable device outside of the clinic excluding No ?Patient Alerts: ABI not obtainable ?cellular tissue based products placed in the center ?since last visit: ?Has Dressing in Place as Prescribed: Yes ?Pain Present Now: No ?Electronic Signature(s) ?Signed: 02/09/2022 3:07:13 PM By: Karl Ito ?Entered By: Karl Ito on 02/09/2022 13:31:14 ?-------------------------------------------------------------------------------- ?Encounter Discharge Information Details ?Patient Name: Date of Service: ?[name], [name] 02/09/2022 1:15 PM ?Medical Record Number: [id number] ?Patient Account Number: [id number] ?Date of Birth/Sex: Treating RN: ?1931-07-12 (88 y.o. F) Boehlein, Linda ?Primary Care Hilma Steinhilber: SYSTEM, PRO V IDER Other Clinician: ?Referring Madoc Holquin: ?Treating Doraine Schexnider/Extender: Duanne Guess ?Weeks in Treatment: 1 ?Encounter Discharge Information Items Post Procedure Vitals ?Discharge Condition: Stable ?Temperature (F):  98.6 ?Ambulatory Status: Wheelchair ?Pulse (bpm): 88 ?Discharge Destination: Skilled Nursing Facility ?Respiratory Rate (breaths/min): 18 ?Telephoned: No ?Blood Pressure (mmHg): 173/81 ?Orders Sent: Yes ?Transportation: Other ?Accompanied By: son, facility staff ?Schedule Follow-up Appointment: Yes ?Clinical Summary of Care: Patient Declined ?Electronic Signature(s) ?Signed: 02/09/2022 5:05:47 PM By: Zenaida Deed RN, BSN ?Entered By: Zenaida Deed on 02/09/2022 14:35:17 ?-------------------------------------------------------------------------------- ?Lower Extremity Assessment Details ?Patient Name: ?Date of Service: ?[name], [name] 02/09/2022 1:15 PM ?Medical Record Number: [id number] ?Patient Account Number: [id number] ?Date of Birth/Sex: ?Treating RN: ?03/31/1931 (88 y.o. F) Redmond Pulling ?Primary Care Marvelyn Bouchillon: SYSTEM, PRO V IDER ?Other Clinician: ?Referring Pema Thomure: ?Treating Vinal Rosengrant/Extender: Duanne Guess ?Weeks in Treatment: 1 ?Edema Assessment ?Assessed: [Left: No] [Right: No] ?E[Left: dema] [Right: :] ?Calf ?Left: Right: ?Point of Measurement: 26 cm From Medial Instep 36.5 cm ?Ankle ?Left: Right: ?Point of Measurement: 10 cm From Medial Instep 26 cm ?Vascular Assessment ?Pulses: ?Dorsalis Pedis ?Palpable: [Left:Yes] ?Electronic Signature(s) ?Signed: 02/09/2022 4:21:04 PM By: Redmond Pulling RN, BSN ?Entered By: Redmond Pulling on 02/09/2022 13:58:39 ?-------------------------------------------------------------------------------- ?Multi Wound Chart Details ?Patient Name: ?Date of Service: ?[name], [name] 02/09/2022 1:15 PM ?Medical Record Number: [id number] ?Patient Account Number: [id number] ?Date of Birth/Sex: ?Treating RN: ?1931-05-31 (88 y.o. F) Boehlein, Linda ?Primary Care Marilin Kofman: SYSTEM, PRO V IDER ?Other Clinician: ?Referring Cutler Sunday: ?Treating Lluvia Gwynne/Extender: Duanne Guess ?Weeks in Treatment: 1 ?Vital Signs ?Height(in): ?Pulse(bpm): 88 ?Weight(lbs): ?Blood Pressure(mmHg):  173/81 ?Body Mass Index(BMI): ?Temperature(??F): 98.6 ?Respiratory Rate(breaths/min): 18 ?Photos: [1:Left Fernande Bras oe] [N/A:N/A N/A] ?Wound Location: [1:Gradually Appeared] [N/A:N/A] ?Wounding Event: [1:Diabetic Wound/Ulcer of the Lower] [N/A:N/A] ?Primary Etiology: [1:Extremity Arrhythmia, Congestive Heart Failure, N/A] ?Comorbid History: [1:Hypertension, Type II Diabetes, Gout, Osteoarthritis 09/29/2021] [N/A:N/A] ?Date Acquired: [1:1] [N/A:N/A] ?Weeks of Treatment: [1:Open] [N/A:N/A] ?Wound Status: [1:No] [N/A:N/A] ?Wound Recurrence: [1:0.6x0.5x0.2] [N/A:N/A] ?Measurements L x W x D (cm) [1:0.236] [N/A:N/A] ?A (cm?) : ?rea [1:0.047] [N/A:N/A] ?Volume (cm?) : [1:16.60%] [N/A:N/A] ?% Reduction in A [1:rea: 17.50%] [N/A:N/A] ?% Reduction in Volume: [1:Unable to visualize wound bed] [N/A:N/A] ?Classification: [  1:Medium] [N/A:N/A] ?Exudate A mount: [1:Serous] [N/A:N/A] ?Exudate Type: [1:amber] [N/A:N/A] ?Exudate Color: [1:Flat and Intact] [N/A:N/A] ?Wound Margin: [1:Small (1-33%)] [N/A:N/A] ?Granulation A mount: [1:Pink] [N/A:N/A] ?Granulation Quality: [1:Large (67-100%)] [N/A:N/A] ?Necrotic A mount: ?[1:Fat Layer (Subcutaneous Tissue): Yes N/A] ?Exposed Structures: ?[1:Fascia: No Tendon: No Muscle: No Joint: No Bone: No None] [N/A:N/A] ?Epithelialization: [1:Debridement - Excisional] [N/A:N/A] ?Debridement: ?Pre-procedure Verification/Time Out 14:15 [N/A:N/A] ?Taken: [1:Lidocaine 4% Topical Solution] [N/A:N/A] ?Pain Control: [1:Subcutaneous, Slough] [N/A:N/A] ?Tissue Debrided: [1:Skin/Subcutaneous Tissue] [N/A:N/A] ?Level: [1:0.3] [N/A:N/A] ?Debridement A (sq cm): [1:rea Curette] [N/A:N/A] ?Instrument: [1:None] [N/A:N/A] ?Bleeding: [1:4] [N/A:N/A] ?Procedural Pain: [1:2] [N/A:N/A] ?Post Procedural Pain: [1:Procedure was tolerated well] [N/A:N/A] ?Debridement Treatment Response: [1:0.6x0.5x0.2] [N/A:N/A] ?Post Debridement Measurements L x ?W x D (cm) [1:0.047] [N/A:N/A] ?Post Debridement Volume: (cm?) [1:Debridement]  [N/A:N/A] ?Treatment Notes ?Electronic Signature(s) ?Signed: 02/09/2022 2:22:25 PM By: Duanne Guess MD FACS ?Signed: 02/09/2022 5:05:47 PM By: Zenaida Deed RN, BSN ?Entered By: Duanne Guess on 02/09/2022 14:22:25 ?-------------------------------------------------------------------------------- ?Multi-Disciplinary Care Plan Details ?Patient Name: ?Date of Service: ?[name], [name] 02/09/2022 1:15 PM ?Medical Record Number: [id number] ?Patient Account Number: [id number] ?Date of Birth/Sex: ?Treating RN: ?[birth year] (88 y.o. F) Boehlein, Linda ?Primary Care Nelsy Madonna: SYSTEM, PRO V IDER ?Other Clinician: ?Referring Cherrie Franca: ?Treating Tanasha Menees/Extender: Duanne Guess ?Weeks in Treatment: 1 ?Multidisciplinary Care Plan reviewed with physician ?Active Inactive ?Nutrition ?Nursing Diagnoses: ?Impaired glucose control: actual or potential ?Potential for alteratiion in Nutrition/Potential for imbalanced nutrition ?Goals: ?Patient/caregiver will maintain therapeutic glucose control ?Date Initiated: 02/01/2022 ?Target Resolution Date: 03/04/2022 ?Goal Status: Active ?Interventions: ?Assess HgA1c results as ordered upon admission and as needed ?Assess patient nutrition upon admission and as needed per policy ?Provide education on elevated blood sugars and impact on wound healing ?Provide education on nutrition ?Treatment Activities: ?Education provided on Nutrition : 02/01/2022 ?Notes: ?Wound/Skin Impairment ?Nursing Diagnoses: ?Impaired tissue integrity ?Knowledge deficit related to ulceration/compromised skin integrity ?Goals: ?Patient/caregiver will verbalize understanding of skin care regimen ?Date Initiated: 02/01/2022 ?Target Resolution Date: 03/04/2022 ?Goal Status: Active ?Ulcer/skin breakdown will have a volume reduction of 30% by week 4 ?Date Initiated: 02/01/2022 ?Target Resolution Date: 03/04/2022 ?Goal Status: Active ?Interventions: ?Assess patient/caregiver ability to obtain necessary supplies ?Assess  patient/caregiver ability to perform ulcer/skin care regimen upon admission and as needed ?Assess ulceration(s) every visit ?Provide education on ulcer and skin care ?Notes: ?Electronic Signature(s) ?Signed: 02/09/2022 5:05:47

## 2022-02-09 NOTE — Progress Notes (Signed)
KENDEL, PESNELL ([id number]) ?Visit Report for 02/09/2022 ?Chief Complaint Document Details ?Patient Name: Date of Service: ?[name], [name] 02/09/2022 1:15 PM ?Medical Record Number: [id number] ?Patient Account Number: [id number] ?Date of Birth/Sex: Treating RN: ?11/08/1931 (88 y.o. F) Boehlein, Linda ?Primary Care Provider: SYSTEM, PRO V IDER Other Clinician: ?Referring Provider: ?Treating Provider/Extender: Duanne Guess ?Weeks in Treatment: 1 ?Information Obtained from: Patient ?Chief Complaint ?Patient presents to the wound care center with open non-healing surgical wound(s) ?Electronic Signature(s) ?Signed: 02/09/2022 2:22:34 PM By: Duanne Guess MD FACS ?Entered By: Duanne Guess on 02/09/2022 14:22:33 ?-------------------------------------------------------------------------------- ?Debridement Details ?Patient Name: Date of Service: ?[name], [name] 02/09/2022 1:15 PM ?Medical Record Number: [id number] ?Patient Account Number: [id number] ?Date of Birth/Sex: Treating RN: ?[birth year] (88 y.o. F) Boehlein, Linda ?Primary Care Provider: SYSTEM, PRO V IDER Other Clinician: ?Referring Provider: ?Treating Provider/Extender: Duanne Guess ?Weeks in Treatment: 1 ?Debridement Performed for Assessment: Wound #1 Left T Great ?oe ?Performed By: Physician Duanne Guess, MD ?Debridement Type: Debridement ?Severity of Tissue Pre Debridement: Fat layer exposed ?Level of Consciousness (Pre-procedure): Awake and Alert ?Pre-procedure Verification/Time Out Yes - 14:15 ?Taken: ?Start Time: 14:15 ?Pain Control: Lidocaine 4% T opical Solution ?T Area Debrided (L x W): ?otal 0.6 (cm) x 0.5 (cm) = 0.3 (cm?) ?Tissue and other material debrided: Viable, Non-Viable, Slough, Subcutaneous, Slough ?Level: Skin/Subcutaneous Tissue ?Debridement Description: Excisional ?Instrument: Curette ?Bleeding: None ?Procedural Pain: 4 ?Post Procedural Pain: 2 ?Response to Treatment: Procedure was tolerated well ?Level of Consciousness  (Post- Awake and Alert ?procedure): ?Post Debridement Measurements of Total Wound ?Length: (cm) 0.6 ?Width: (cm) 0.5 ?Depth: (cm) 0.2 ?Volume: (cm?) 0.047 ?Character of Wound/Ulcer Post Debridement: Requires Further Debridement ?Severity of Tissue Post Debridement: Fat layer exposed ?Post Procedure Diagnosis ?Same as Pre-procedure ?Electronic Signature(s) ?Signed: 02/09/2022 3:04:13 PM By: Duanne Guess MD FACS ?Signed: 02/09/2022 5:05:47 PM By: Zenaida Deed RN, BSN ?Entered By: Zenaida Deed on 02/09/2022 14:18:02 ?-------------------------------------------------------------------------------- ?HPI Details ?Patient Name: Date of Service: ?[name], [name] 02/09/2022 1:15 PM ?Medical Record Number: [id number] ?Patient Account Number: [id number] ?Date of Birth/Sex: Treating RN: ?09/24/1931 (88 y.o. F) Boehlein, Linda ?Primary Care Provider: SYSTEM, PRO V IDER Other Clinician: ?Referring Provider: ?Treating Provider/Extender: Duanne Guess ?Weeks in Treatment: 1 ?History of Present Illness ?HPI Description: ADMISSION ?02/01/2022 ?This is an 88 year old woman who is a resident of [place]. She has a past medical history notable for type 2 diabetes mellitus, congestive heart failure, ?pulmonary hypertension, and complete heart block. She is accompanied today by her son. Approximately 4 months ago, she had an ingrown toe nail removed. ?Apparently this went a bit deep. Since that time, it has not healed. The patient is nonambulatory. Apparently they have been applying Santyl to the site at her ?facility. She was on antibiotics but I am not certain which specific drugs she took. She completed her last course a few weeks ago. We were unable to obtain ?ABIs in clinic secondary to the patient's body habitus. She has not had any formal vascular studies nor any x-ray of the area. ?02/09/2022: The wound is roughly the same size today, but the amount of slough in the base has decreased significantly. She apparently had  an x-ray done at ?her facility, but we do not have the report; per verbal report from facility staff, "there was nothing there." We had requested formal ABI/TBI studies, but these ?were not done. She has been in [facility] under [employer] [facility]	[place]. ?Electronic Signature(s) ?Signed: 02/09/2022 2:23:37 PM By: Duanne Guess MD FACS ?Entered By: Duanne Guess  on 02/09/2022 14:23:37 ?-------------------------------------------------------------------------------- ?Physical Exam Details ?Patient Name: Date of Service: ?[name], [name] 02/09/2022 1:15 PM ?Medical Record Number: [id number] ?Patient Account Number: [id number] ?Date of Birth/Sex: Treating RN: ?06-19-1931 (88 y.o. F) Boehlein, Linda ?Primary Care Provider: SYSTEM, PRO V IDER Other Clinician: ?Referring Provider: ?Treating Provider/Extender: Duanne Guess ?Weeks in Treatment: 1 ?Constitutional ?She is hypertensive, but asymptomatic.. . . . No acute distress. ?Respiratory ?Normal work of breathing on room air. ?Notes ?02/09/2022: Wound examon the left great toe, there is an open wound with a small amount of nonviable tissue and slough, but this is significantly improved ?from last week. No periwound erythema or induration. No concern for infection. Less tender than last week. ?Electronic Signature(s) ?Signed: 02/09/2022 2:25:44 PM By: Duanne Guess MD FACS ?Signed: 02/09/2022 2:25:44 PM By: Duanne Guess MD FACS ?Entered By: Duanne Guess on 02/09/2022 14:25:44 ?-------------------------------------------------------------------------------- ?Physician Orders Details ?Patient Name: ?Date of Service: ?[name], [name] 02/09/2022 1:15 PM ?Medical Record Number: [id number] ?Patient Account Number: [id number] ?Date of Birth/Sex: ?Treating RN: ?Sep 15, 1931 (88 y.o. F) Boehlein, Linda ?Primary Care Provider: SYSTEM, PRO V IDER ?Other Clinician: ?Referring Provider: ?Treating Provider/Extender: Duanne Guess ?Weeks in Treatment: 1 ?Verbal / Phone Orders:  No ?Diagnosis Coding ?ICD-10 Coding ?Code Description ?E11.621 Type 2 diabetes mellitus with foot ulcer ?V42.595 Non-pressure chronic ulcer of other part of left foot with fat layer exposed ?I50.32 Chronic diastolic (congestive) heart failure ?I27.20 Pulmonary hypertension, unspecified ?Follow-up Appointments ?ppointment in 1 week. - Dr. Lady Gary and Bonita Quin ?Return A ?Bathing/ Shower/ Hygiene ?May shower with protection but do not get wound dressing(s) wet. ?Off-Loading ?Other: - No direct pressure on toes ?Wound Treatment ?Wound #1 - T Great ?oe Wound Laterality: Left ?Cleanser: Wound Cleanser 1 x Per Day ?Discharge Instructions: Cleanse the wound with wound cleanser prior to applying a clean dressing using gauze sponges, not tissue or cotton balls. ?Prim Dressing: Hydrofera Blue Classic Foam, 2x2 in 1 x Per Day ?ary ?Discharge Instructions: Moisten with saline prior to applying to wound bed, apply after Santyl ?Prim Dressing: Santyl Ointment 1 x Per Day ?ary ?Discharge Instructions: Apply nickel thick amount to wound bed, cover with Hydrofera Blue ?Secondary Dressing: Woven Gauze Sponges 2x2 in 1 x Per Day ?Discharge Instructions: Apply over primary dressing as directed. ?Secured With: Insurance underwriter, Sterile 2x75 (in/in) 1 x Per Day ?Discharge Instructions: Secure with stretch gauze as directed. ?Secured With: Paper Tape, 1x10 (in/yd) 1 x Per Day ?Discharge Instructions: Secure dressing with tape as directed. ?Patient Medications ?llergies: aspirin, tramadol, naproxen, shellfish derived ?A ?Notifications Medication Indication Start End ?prior to debridement 02/09/2022 ?lidocaine ?DOSE topical 4 % cream - cream topical ?Electronic Signature(s) ?Signed: 02/09/2022 3:04:13 PM By: Duanne Guess MD FACS ?Entered By: Duanne Guess on 02/09/2022 14:27:08 ?-------------------------------------------------------------------------------- ?Problem List Details ?Patient Name: ?Date of Service: ?[name],  [name] 02/09/2022 1:15 PM ?Medical Record Number: [id number] ?Patient Account Number: [id number] ?Date of Birth/Sex: ?Treating RN: ?July 28, 1931 (88 y.o. F) Boehlein, Linda ?Primary Care Provider: SYSTEM, PRO V I

## 2022-02-16 ENCOUNTER — Encounter (HOSPITAL_BASED_OUTPATIENT_CLINIC_OR_DEPARTMENT_OTHER): Payer: Medicare Other | Admitting: General Surgery

## 2022-02-16 DIAGNOSIS — E11621 Type 2 diabetes mellitus with foot ulcer: Secondary | ICD-10-CM | POA: Diagnosis not present

## 2022-02-17 ENCOUNTER — Other Ambulatory Visit (HOSPITAL_COMMUNITY): Payer: Self-pay | Admitting: General Surgery

## 2022-02-17 DIAGNOSIS — L97522 Non-pressure chronic ulcer of other part of left foot with fat layer exposed: Secondary | ICD-10-CM

## 2022-02-17 NOTE — Progress Notes (Signed)
TABRIA, STEINES ([id number]) ?Visit Report for 02/16/2022 ?Arrival Information Details ?Patient Name: Date of Service: ?[name], [name] 02/16/2022 12:45 PM ?Medical Record Number: [id number] ?Patient Account Number: [id number] ?Date of Birth/Sex: Treating RN: ?1931-03-31 (88 y.o. [name], [name] ?Primary Care Ritu Gagliardo: SYSTEM, PRO V IDER Other Clinician: ?Referring Nakayla Rorabaugh: ?Treating Marcha Licklider/Extender: Duanne Guess ?Weeks in Treatment: 2 ?Visit Information History Since Last Visit ?Added or deleted any medications: No ?Patient Arrived: Wheel Chair ?Any new allergies or adverse reactions: No ?Arrival Time: 12:53 ?Had a fall or experienced change in No ?Accompanied By: son ?activities of daily living that may affect ?Transfer Assistance: Manual ?risk of falls: ?Patient Identification Verified: Yes ?Signs or symptoms of abuse/neglect since last visito No ?Secondary Verification Process Completed: Yes ?Hospitalized since last visit: No ?Patient Has Alerts: Yes ?Implantable device outside of the clinic excluding No ?Patient Alerts: ABI not obtainable ?cellular tissue based products placed in the center ?since last visit: ?Has Dressing in Place as Prescribed: Yes ?Pain Present Now: No ?Electronic Signature(s) ?Signed: 02/17/2022 5:30:36 PM By: Zandra Abts RN, BSN ?Entered By: Zandra Abts on 02/16/2022 13:01:14 ?-------------------------------------------------------------------------------- ?Encounter Discharge Information Details ?Patient Name: Date of Service: ?[name], [name] 02/16/2022 12:45 PM ?Medical Record Number: [id number] ?Patient Account Number: [id number] ?Date of Birth/Sex: Treating RN: ?1931-03-24 (88 y.o. [name], [name] ?Primary Care Nurah Petrides: SYSTEM, PRO V IDER Other Clinician: ?Referring Ethelreda Sukhu: ?Treating Juliocesar Blasius/Extender: Duanne Guess ?Weeks in Treatment: 2 ?Encounter Discharge Information Items Post Procedure Vitals ?Discharge Condition: Stable ?Temperature (F): 98.1 ?Ambulatory  Status: Wheelchair ?Pulse (bpm): 89 ?Discharge Destination: Home ?Respiratory Rate (breaths/min): 16 ?Transportation: Private Auto ?Blood Pressure (mmHg): 145/80 ?Accompanied By: son ?Schedule Follow-up Appointment: Yes ?Clinical Summary of Care: Patient Declined ?Electronic Signature(s) ?Signed: 02/17/2022 5:30:36 PM By: Zandra Abts RN, BSN ?Entered By: Zandra Abts on 02/16/2022 17:26:23 ?-------------------------------------------------------------------------------- ?Lower Extremity Assessment Details ?Patient Name: ?Date of Service: ?[name], [name] 02/16/2022 12:45 PM ?Medical Record Number: [id number] ?Patient Account Number: [id number] ?Date of Birth/Sex: ?Treating RN: ?May 11, 1931 (88 y.o. [name], [name] ?Primary Care Ival Basquez: SYSTEM, PRO V IDER ?Other Clinician: ?Referring Milla Wahlberg: ?Treating Kazuo Durnil/Extender: Duanne Guess ?Weeks in Treatment: 2 ?Edema Assessment ?Assessed: [Left: No] [Right: No] ?E[Left: dema] [Right: :] ?Calf ?Left: Right: ?Point of Measurement: 26 cm From Medial Instep 36 cm ?Ankle ?Left: Right: ?Point of Measurement: 10 cm From Medial Instep 26 cm ?Vascular Assessment ?Pulses: ?Dorsalis Pedis ?Palpable: [Left:Yes] ?Electronic Signature(s) ?Signed: 02/17/2022 5:30:36 PM By: Zandra Abts RN, BSN ?Entered By: Zandra Abts on 02/16/2022 13:01:50 ?-------------------------------------------------------------------------------- ?Multi Wound Chart Details ?Patient Name: ?Date of Service: ?[name], [name] 02/16/2022 12:45 PM ?Medical Record Number: [id number] ?Patient Account Number: [id number] ?Date of Birth/Sex: ?Treating RN: ?[birth year] (88 y.o. F) Boehlein, Linda ?Primary Care Magon Croson: SYSTEM, PRO V IDER ?Other Clinician: ?Referring Lissandra Keil: ?Treating Dao Memmott/Extender: Duanne Guess ?Weeks in Treatment: 2 ?Vital Signs ?Height(in): ?Pulse(bpm): 89 ?Weight(lbs): ?Blood Pressure(mmHg): 145/80 ?Body Mass Index(BMI): ?Temperature(??F): 98.1 ?Respiratory Rate(breaths/min):  16 ?Photos: [1:Left Fernande Bras oe] [N/A:N/A N/A] ?Wound Location: [1:Gradually Appeared] [N/A:N/A] ?Wounding Event: [1:Diabetic Wound/Ulcer of the Lower] [N/A:N/A] ?Primary Etiology: [1:Extremity Arrhythmia, Congestive Heart Failure, N/A] ?Comorbid History: [1:Hypertension, Type II Diabetes, Gout, Osteoarthritis 09/29/2021] [N/A:N/A] ?Date Acquired: [1:2] [N/A:N/A] ?Weeks of Treatment: [1:Open] [N/A:N/A] ?Wound Status: [1:No] [N/A:N/A] ?Wound Recurrence: [1:0.6x0.5x0.2] [N/A:N/A] ?Measurements L x W x D (cm) [1:0.236] [N/A:N/A] ?A (cm?) : ?rea [1:0.047] [N/A:N/A] ?Volume (cm?) : [1:16.60%] [N/A:N/A] ?% Reduction in A [1:rea: 17.50%] [N/A:N/A] ?% Reduction in Volume: [1:Unable to visualize wound bed] [N/A:N/A] ?Classification: [1:Medium] [N/A:N/A] ?Exudate A mount: [1:Serous] [  N/A:N/A] ?Exudate Type: [1:amber] [N/A:N/A] ?Exudate Color: [1:Flat and Intact] [N/A:N/A] ?Wound Margin: [1:Small (1-33%)] [N/A:N/A] ?Granulation A mount: [1:Pink, Pale] [N/A:N/A] ?Granulation Quality: [1:Large (67-100%)] [N/A:N/A] ?Necrotic A mount: ?[1:Fat Layer (Subcutaneous Tissue): Yes N/A] ?Exposed Structures: ?[1:Fascia: No Tendon: No Muscle: No Joint: No Bone: No Small (1-33%)] [N/A:N/A] ?Epithelialization: [1:Debridement - Selective/Open Wound N/A] ?Debridement: ?Pre-procedure Verification/Time Out 13:07 [N/A:N/A] ?Taken: [1:Slough] [N/A:N/A] ?Tissue Debrided: [1:Non-Viable Tissue] [N/A:N/A] ?Level: [1:0.3] [N/A:N/A] ?Debridement A (sq cm): [1:rea Curette] [N/A:N/A] ?Instrument: [1:Minimum] [N/A:N/A] ?Bleeding: [1:Pressure] [N/A:N/A] ?Hemostasis A chieved: [1:0] [N/A:N/A] ?Procedural Pain: [1:0] [N/A:N/A] ?Post Procedural Pain: [1:Procedure was tolerated well] [N/A:N/A] ?Debridement Treatment Response: [1:0.6x0.5x0.2] [N/A:N/A] ?Post Debridement Measurements L x ?W x D (cm) [1:0.047] [N/A:N/A] ?Post Debridement Volume: (cm?) [1:Debridement] [N/A:N/A] ?Treatment Notes ?Electronic Signature(s) ?Signed: 02/16/2022 1:45:48 PM By: Duanne Guess MD FACS ?Signed: 02/16/2022 6:07:33 PM By: Zenaida Deed RN, BSN ?Entered By: Duanne Guess on 02/16/2022 13:45:48 ?-------------------------------------------------------------------------------- ?Multi-Disciplinary Care Plan Details ?Patient Name: ?Date of Service: ?[name], [name] 02/16/2022 12:45 PM ?Medical Record Number: [id number] ?Patient Account Number: [id number] ?Date of Birth/Sex: ?Treating RN: ?1931-08-05 (88 y.o. [name], [name] ?Primary Care Petula Rotolo: SYSTEM, PRO V IDER ?Other Clinician: ?Referring Deone Leifheit: ?Treating Gavin Telford/Extender: Duanne Guess ?Weeks in Treatment: 2 ?Multidisciplinary Care Plan reviewed with physician ?Active Inactive ?Nutrition ?Nursing Diagnoses: ?Impaired glucose control: actual or potential ?Potential for alteratiion in Nutrition/Potential for imbalanced nutrition ?Goals: ?Patient/caregiver will maintain therapeutic glucose control ?Date Initiated: 02/01/2022 ?Target Resolution Date: 03/04/2022 ?Goal Status: Active ?Interventions: ?Assess HgA1c results as ordered upon admission and as needed ?Assess patient nutrition upon admission and as needed per policy ?Provide education on elevated blood sugars and impact on wound healing ?Provide education on nutrition ?Treatment Activities: ?Education provided on Nutrition : 02/01/2022 ?Notes: ?Wound/Skin Impairment ?Nursing Diagnoses: ?Impaired tissue integrity ?Knowledge deficit related to ulceration/compromised skin integrity ?Goals: ?Patient/caregiver will verbalize understanding of skin care regimen ?Date Initiated: 02/01/2022 ?Target Resolution Date: 03/04/2022 ?Goal Status: Active ?Ulcer/skin breakdown will have a volume reduction of 30% by week 4 ?Date Initiated: 02/01/2022 ?Target Resolution Date: 03/04/2022 ?Goal Status: Active ?Interventions: ?Assess patient/caregiver ability to obtain necessary supplies ?Assess patient/caregiver ability to perform ulcer/skin care regimen upon admission and as needed ?Assess  ulceration(s) every visit ?Provide education on ulcer and skin care ?Notes: ?Electronic Signature(s) ?Signed: 02/17/2022 5:30:36 PM By: Zandra Abts RN, BSN ?Entered By: Zandra Abts on 02/16/2022 13:06:36 ?-

## 2022-02-17 NOTE — Progress Notes (Signed)
[name], [name] ([id number]) ?Visit Report for 02/16/2022 ?Chief Complaint Document Details ?Patient Name: Date of Service: ?[name], [name] 02/16/2022 12:45 PM ?Medical Record Number: [id number] ?Patient Account Number: [id number] ?Date of Birth/Sex: Treating RN: ?1931/06/10 (88 y.o. F) Boehlein, Linda ?Primary Care Provider: SYSTEM, PRO V IDER Other Clinician: ?Referring Provider: ?Treating Provider/Extender: Duanne Guess ?Weeks in Treatment: 2 ?Information Obtained from: Patient ?Chief Complaint ?Patient presents to the wound care center with open non-healing surgical wound(s) ?Electronic Signature(s) ?Signed: 02/16/2022 1:45:58 PM By: Duanne Guess MD FACS ?Entered By: Duanne Guess on 02/16/2022 13:45:58 ?-------------------------------------------------------------------------------- ?Debridement Details ?Patient Name: Date of Service: ?[name], [name] 02/16/2022 12:45 PM ?Medical Record Number: [id number] ?Patient Account Number: [id number] ?Date of Birth/Sex: Treating RN: ?1931-05-15 (88 y.o. [name], Shatara ?Primary Care Provider: SYSTEM, PRO V IDER Other Clinician: ?Referring Provider: ?Treating Provider/Extender: Duanne Guess ?Weeks in Treatment: 2 ?Debridement Performed for Assessment: Wound #1 Left T Great ?oe ?Performed By: Physician Duanne Guess, MD ?Debridement Type: Debridement ?Severity of Tissue Pre Debridement: Fat layer exposed ?Level of Consciousness (Pre-procedure): Awake and Alert ?Pre-procedure Verification/Time Out Yes - 13:07 ?Taken: ?Start Time: 13:07 ?T Area Debrided (L x W): ?otal 0.6 (cm) x 0.5 (cm) = 0.3 (cm?) ?Tissue and other material debrided: Non-Viable, Slough, Slough ?Level: Non-Viable Tissue ?Debridement Description: Selective/Open Wound ?Instrument: Curette ?Bleeding: Minimum ?Hemostasis Achieved: Pressure ?End Time: 13:09 ?Procedural Pain: 0 ?Post Procedural Pain: 0 ?Response to Treatment: Procedure was tolerated well ?Level of Consciousness (Post- Awake  and Alert ?procedure): ?Post Debridement Measurements of Total Wound ?Length: (cm) 0.6 ?Width: (cm) 0.5 ?Depth: (cm) 0.2 ?Volume: (cm?) 0.047 ?Character of Wound/Ulcer Post Debridement: Requires Further Debridement ?Severity of Tissue Post Debridement: Fat layer exposed ?Post Procedure Diagnosis ?Same as Pre-procedure ?Electronic Signature(s) ?Signed: 02/16/2022 3:14:49 PM By: Duanne Guess MD FACS ?Signed: 02/17/2022 5:30:36 PM By: Zandra Abts RN, BSN ?Entered By: Zandra Abts on 02/16/2022 13:09:08 ?-------------------------------------------------------------------------------- ?HPI Details ?Patient Name: Date of Service: ?[name], [name] 02/16/2022 12:45 PM ?Medical Record Number: [id number] ?Patient Account Number: [id number] ?Date of Birth/Sex: Treating RN: ?[birth year] (88 y.o. F) Boehlein, Linda ?Primary Care Provider: SYSTEM, PRO V IDER Other Clinician: ?Referring Provider: ?Treating Provider/Extender: Duanne Guess ?Weeks in Treatment: 2 ?History of Present Illness ?HPI Description: ADMISSION ?02/01/2022 ?This is an 88 year old woman who is a resident of [facility]. She has a past medical history notable for type 2 diabetes mellitus, congestive heart failure, ?pulmonary hypertension, and complete heart block. She is accompanied today by her son. Approximately 4 months ago, she had an ingrown toe nail removed. ?Apparently this went a bit deep. Since that time, it has not healed. The patient is nonambulatory. Apparently they have been applying Santyl to the site at her ?facility. She was on antibiotics but I am not certain which specific drugs she took. She completed her last course a few weeks ago. We were unable to obtain ?ABIs in clinic secondary to the patient's body habitus. She has not had any formal vascular studies nor any x-ray of the area. ?02/09/2022: The wound is roughly the same size today, but the amount of slough in the base has decreased significantly. She apparently had an x-ray done  at ?her facility, but we do not have the report; per verbal report from facility staff, "there was nothing there." We had requested formal ABI/TBI studies, but these ?were not done. She has been in [place] under [facility]	[place]. ?02/16/2022: The wound measured about the same today, but to my eye, it appears rather smaller. The  slough in the base is minimal. Her vascular studies are ?scheduled for Friday. We have been using Santyl under Hydrofera Blue. No concern for infection. ?Electronic Signature(s) ?Signed: 02/16/2022 1:46:37 PM By: Duanne Guess MD FACS ?Entered By: Duanne Guess on 02/16/2022 13:46:37 ?-------------------------------------------------------------------------------- ?Physical Exam Details ?Patient Name: Date of Service: ?[name], [name] 02/16/2022 12:45 PM ?Medical Record Number: [id number] ?Patient Account Number: [id number] ?Date of Birth/Sex: Treating RN: ?1930/12/20 (88 y.o. F) Boehlein, Linda ?Primary Care Provider: SYSTEM, PRO V IDER Other Clinician: ?Referring Provider: ?Treating Provider/Extender: Duanne Guess ?Weeks in Treatment: 2 ?Constitutional ?. . . . No acute distress. ?Respiratory ?Normal work of breathing on room air. ?Notes ?02/16/2022: Wound examon the left great toe, there is a circular wound with surrounding epithelialized tissue. The central opening has a small amount of soft ?slough. Pain is markedly improved from her prior visits. ?Electronic Signature(s) ?Signed: 02/16/2022 1:47:41 PM By: Duanne Guess MD FACS ?Entered By: Duanne Guess on 02/16/2022 13:47:40 ?-------------------------------------------------------------------------------- ?Physician Orders Details ?Patient Name: ?Date of Service: ?[name], [name] 02/16/2022 12:45 PM ?Medical Record Number: [id number] ?Patient Account Number: [id number] ?Date of Birth/Sex: ?Treating RN: ?April 30, 1931 (88 y.o. [name], Shatara ?Primary Care Provider: SYSTEM, PRO V IDER ?Other Clinician: ?Referring  Provider: ?Treating Provider/Extender: Duanne Guess ?Weeks in Treatment: 2 ?Verbal / Phone Orders: No ?Diagnosis Coding ?ICD-10 Coding ?Code Description ?E11.621 Type 2 diabetes mellitus with foot ulcer ?K02.542 Non-pressure chronic ulcer of other part of left foot with fat layer exposed ?I50.32 Chronic diastolic (congestive) heart failure ?I27.20 Pulmonary hypertension, unspecified ?Follow-up Appointments ?ppointment in 1 week. - Dr. Lady Gary - Room 1 ?Return A ?Bathing/ Shower/ Hygiene ?May shower with protection but do not get wound dressing(s) wet. ?Off-Loading ?Other: - No direct pressure on toes ?Wound Treatment ?Wound #1 - T Great ?oe Wound Laterality: Left ?Cleanser: Wound Cleanser 1 x Per Day ?Discharge Instructions: Cleanse the wound with wound cleanser prior to applying a clean dressing using gauze sponges, not tissue or cotton balls. ?Prim Dressing: Hydrofera Blue Classic Foam, 2x2 in 1 x Per Day ?ary ?Discharge Instructions: Moisten with saline prior to applying to wound bed, apply after Santyl ?Prim Dressing: Santyl Ointment 1 x Per Day ?ary ?Discharge Instructions: Apply nickel thick amount to wound bed, cover with Hydrofera Blue ?Secondary Dressing: Woven Gauze Sponges 2x2 in 1 x Per Day ?Discharge Instructions: Apply over primary dressing as directed. ?Secured With: Insurance underwriter, Sterile 2x75 (in/in) 1 x Per Day ?Discharge Instructions: Secure with stretch gauze as directed. ?Secured With: Paper Tape, 1x10 (in/yd) 1 x Per Day ?Discharge Instructions: Secure dressing with tape as directed. ?Electronic Signature(s) ?Signed: 02/16/2022 3:14:49 PM By: Duanne Guess MD FACS ?Entered By: Duanne Guess on 02/16/2022 13:48:28 ?-------------------------------------------------------------------------------- ?Problem List Details ?Patient Name: Date of Service: ?[name], Kamoria S. 02/16/2022 12:45 PM ?Medical Record Number: [id number] ?Patient Account Number: [id number] ?Date of  Birth/Sex: Treating RN: ?June 23, 1931 (88 y.o. [name], Shatara ?Primary Care Provider: SYSTEM, PRO V IDER Other Clinician: ?Referring Provider: ?Treating Provider/Extender: Duanne Guess ?Weeks in Treatment: 2 ?Active P

## 2022-02-18 ENCOUNTER — Ambulatory Visit (HOSPITAL_COMMUNITY)
Admission: RE | Admit: 2022-02-18 | Discharge: 2022-02-18 | Disposition: A | Discharge status: Home / Self Care | Payer: Medicare Other | Source: Ambulatory Visit | Attending: General Surgery | Admitting: General Surgery

## 2022-02-18 DIAGNOSIS — L97522 Non-pressure chronic ulcer of other part of left foot with fat layer exposed: Secondary | ICD-10-CM | POA: Insufficient documentation

## 2022-02-21 ENCOUNTER — Encounter (HOSPITAL_COMMUNITY): Payer: Self-pay | Admitting: Cardiology

## 2022-02-23 ENCOUNTER — Encounter (HOSPITAL_BASED_OUTPATIENT_CLINIC_OR_DEPARTMENT_OTHER): Payer: Medicare Other | Attending: General Surgery | Admitting: General Surgery

## 2022-02-23 DIAGNOSIS — E1151 Type 2 diabetes mellitus with diabetic peripheral angiopathy without gangrene: Secondary | ICD-10-CM | POA: Insufficient documentation

## 2022-02-23 DIAGNOSIS — L97522 Non-pressure chronic ulcer of other part of left foot with fat layer exposed: Secondary | ICD-10-CM | POA: Insufficient documentation

## 2022-02-23 DIAGNOSIS — E11621 Type 2 diabetes mellitus with foot ulcer: Secondary | ICD-10-CM | POA: Diagnosis present

## 2022-02-23 DIAGNOSIS — I272 Pulmonary hypertension, unspecified: Secondary | ICD-10-CM | POA: Diagnosis not present

## 2022-02-23 DIAGNOSIS — I5032 Chronic diastolic (congestive) heart failure: Secondary | ICD-10-CM | POA: Insufficient documentation

## 2022-02-23 NOTE — Progress Notes (Signed)
[name], [name] ([id number]) ?Visit Report for 02/23/2022 ?Chief Complaint Document Details ?Patient Name: Date of Service: ?[name], [name] 02/23/2022 1:15 PM ?Medical Record Number: [id number] ?Patient Account Number: [id number] ?Date of Birth/Sex: Treating RN: ?24-Aug-1931 (88 y.o. F) ?Primary Care Provider: SYSTEM, PRO V IDER Other Clinician: ?Referring Provider: ?Treating Provider/Extender: Duanne Guess ?Weeks in Treatment: 3 ?Information Obtained from: Patient ?Chief Complaint ?Patient presents to the wound care center with open non-healing surgical wound(s) ?Electronic Signature(s) ?Signed: 02/23/2022 1:54:32 PM By: Duanne Guess MD FACS ?Entered By: Duanne Guess on 02/23/2022 13:54:32 ?-------------------------------------------------------------------------------- ?Debridement Details ?Patient Name: Date of Service: ?[name], [name] 02/23/2022 1:15 PM ?Medical Record Number: [id number] ?Patient Account Number: [id number] ?Date of Birth/Sex: Treating RN: ?04-May-1931 (88 y.o. F) Boehlein, Linda ?Primary Care Provider: SYSTEM, PRO V IDER Other Clinician: ?Referring Provider: ?Treating Provider/Extender: Duanne Guess ?Weeks in Treatment: 3 ?Debridement Performed for Assessment: Wound #1 Left T Great ?oe ?Performed By: Physician Duanne Guess, MD ?Debridement Type: Debridement ?Severity of Tissue Pre Debridement: Fat layer exposed ?Level of Consciousness (Pre-procedure): Awake and Alert ?Pre-procedure Verification/Time Out Yes - 13:40 ?Taken: ?Start Time: 13:42 ?Pain Control: Lidocaine 4% T opical Solution ?T Area Debrided (L x W): ?otal 0.6 (cm) x 0.6 (cm) = 0.36 (cm?) ?Tissue and other material debrided: Viable, Non-Viable, Callus, Slough, Subcutaneous, Slough ?Level: Skin/Subcutaneous Tissue ?Debridement Description: Excisional ?Instrument: Curette ?Bleeding: Minimum ?Hemostasis Achieved: Pressure ?Procedural Pain: 4 ?Post Procedural Pain: 2 ?Response to Treatment: Procedure was tolerated  well ?Level of Consciousness (Post- Awake and Alert ?procedure): ?Post Debridement Measurements of Total Wound ?Length: (cm) 0.6 ?Width: (cm) 0.6 ?Depth: (cm) 0.1 ?Volume: (cm?) 0.028 ?Character of Wound/Ulcer Post Debridement: Improved ?Severity of Tissue Post Debridement: Fat layer exposed ?Post Procedure Diagnosis ?Same as Pre-procedure ?Electronic Signature(s) ?Signed: 02/23/2022 5:22:08 PM By: Duanne Guess MD FACS ?Signed: 02/23/2022 6:23:41 PM By: Zenaida Deed RN, BSN ?Entered By: Zenaida Deed on 02/23/2022 13:47:31 ?-------------------------------------------------------------------------------- ?HPI Details ?Patient Name: Date of Service: ?[name], [name] 02/23/2022 1:15 PM ?Medical Record Number: [id number] ?Patient Account Number: [id number] ?Date of Birth/Sex: Treating RN: ?[birth year] (88 y.o. F) ?Primary Care Provider: SYSTEM, PRO V IDER Other Clinician: ?Referring Provider: ?Treating Provider/Extender: Duanne Guess ?Weeks in Treatment: 3 ?History of Present Illness ?HPI Description: ADMISSION ?02/01/2022 ?This is an 88 year old woman who is a resident of [place]. She has a past medical history notable for type 2 diabetes mellitus, congestive heart failure, ?pulmonary hypertension, and complete heart block. She is accompanied today by her son. Approximately 4 months ago, she had an ingrown toe nail removed. ?Apparently this went a bit deep. Since that time, it has not healed. The patient is nonambulatory. Apparently they have been applying Santyl to the site at her ?facility. She was on antibiotics but I am not certain which specific drugs she took. She completed her last course a few weeks ago. We were unable to obtain ?ABIs in clinic secondary to the patient's body habitus. She has not had any formal vascular studies nor any x-ray of the area. ?02/09/2022: The wound is roughly the same size today, but the amount of slough in the base has decreased significantly. She apparently had an x-ray  done at ?her facility, but we do not have the report; per verbal report from facility staff, "there was nothing there." We had requested formal ABI/TBI studies, but these ?were not done. She has been in [place] under [employer] [facility]	[place]. ?02/16/2022: The wound measured about the same today, but to my eye, it appears rather smaller. The  slough in the base is minimal. Her vascular studies are ?scheduled for Friday. We have been using Santyl under Hydrofera Blue. No concern for infection. ?02/23/2022: Once again, the wound has been measured about the same size, but I think it looks a bit smaller. There is slough at the base. She had her ABIs done ?last week and these show severe bilateral peripheral arterial disease. We received the report of the foot x-ray that was ordered. There is no concern for ?osteomyelitis at this time. ?Electronic Signature(s) ?Signed: 02/23/2022 1:56:10 PM By: Duanne Guess MD FACS ?Entered By: Duanne Guess on 02/23/2022 13:56:10 ?-------------------------------------------------------------------------------- ?Physical Exam Details ?Patient Name: Date of Service: ?[name], [name] 02/23/2022 1:15 PM ?Medical Record Number: [id number] ?Patient Account Number: [id number] ?Date of Birth/Sex: Treating RN: ?August 06, 1931 (88 y.o. F) ?Primary Care Provider: SYSTEM, PRO V IDER Other Clinician: ?Referring Provider: ?Treating Provider/Extender: Duanne Guess ?Weeks in Treatment: 3 ?Constitutional ?. . . . No acute distress. ?Respiratory ?Normal work of breathing on room air. ?Notes ?02/23/2022: The left great toe circular wound on what used to be the nailbed has epithelialized tissue surrounding it. There is a small amount of soft slough in the ?center. Periwound skin is intact. No concern for infection. ?Electronic Signature(s) ?Signed: 02/23/2022 1:57:55 PM By: Duanne Guess MD FACS ?Entered By: Duanne Guess on 02/23/2022  13:57:55 ?-------------------------------------------------------------------------------- ?Physician Orders Details ?Patient Name: Date of Service: ?[name], [name] 02/23/2022 1:15 PM ?Medical Record Number: [id number] ?Patient Account Number: [id number] ?Date of Birth/Sex: Treating RN: ?[birth year] (88 y.o. F) Boehlein, Linda ?Primary Care Provider: SYSTEM, PRO V IDER Other Clinician: ?Referring Provider: ?Treating Provider/Extender: Duanne Guess ?Weeks in Treatment: 3 ?Verbal / Phone Orders: No ?Diagnosis Coding ?ICD-10 Coding ?Code Description ?E11.621 Type 2 diabetes mellitus with foot ulcer ?W62.035 Non-pressure chronic ulcer of other part of left foot with fat layer exposed ?I50.32 Chronic diastolic (congestive) heart failure ?I27.20 Pulmonary hypertension, unspecified ?Follow-up Appointments ?ppointment in 1 week. - Dr. Lady Gary - Room 1 ?Return A ?Bathing/ Shower/ Hygiene ?May shower with protection but do not get wound dressing(s) wet. ?Off-Loading ?Other: - No direct pressure on toes ?Wound Treatment ?Wound #1 - T Great ?oe Wound Laterality: Left ?Cleanser: Wound Cleanser 1 x Per Day ?Discharge Instructions: Cleanse the wound with wound cleanser prior to applying a clean dressing using gauze sponges, not tissue or cotton balls. ?Prim Dressing: Hydrofera Blue Classic Foam, 2x2 in 1 x Per Day ?ary ?Discharge Instructions: Moisten with saline prior to applying to wound bed, apply after Santyl ?Prim Dressing: Santyl Ointment 1 x Per Day ?ary ?Discharge Instructions: Apply nickel thick amount to wound bed, cover with Hydrofera Blue ?Secondary Dressing: Woven Gauze Sponges 2x2 in 1 x Per Day ?Discharge Instructions: Apply over primary dressing as directed. ?Secured With: Insurance underwriter, Sterile 2x75 (in/in) 1 x Per Day ?Discharge Instructions: Secure with stretch gauze as directed. ?Secured With: Paper Tape, 1x10 (in/yd) 1 x Per Day ?Discharge Instructions: Secure dressing with tape as  directed. ?Consults ?Vascular - Vein and Vascular - abnormal ABIs with nonhealing ulcer left great toe - (ICD10 L97.522 - Non-pressure chronic ulcer of other part of left ?foot with fat layer exposed) ?Electronic Signature(s) ?Signed: 02/23/2022 5:22:08 PM By: Duanne Guess MD FACS ?Entered By:

## 2022-02-23 NOTE — Progress Notes (Signed)
[name], ZACKERY ([id number]) ?Visit Report for 02/23/2022 ?Arrival Information Details ?Patient Name: Date of Service: ?[name] WN, Drinda S. 02/23/2022 1:15 PM ?Medical Record Number: [id number] ?Patient Account Number: [id number] ?Date of Birth/Sex: Treating RN: ?1931/02/07 (88 y.o. F) ?Primary Care Shahzain Kiester: SYSTEM, PRO V IDER Other Clinician: ?Referring Suni Jarnagin: ?Treating Ardella Chhim/Extender: Duanne Guess ?Weeks in Treatment: 3 ?Visit Information History Since Last Visit ?Added or deleted any medications: No ?Patient Arrived: Wheel Chair ?Any new allergies or adverse reactions: No ?Arrival Time: 13:06 ?Had a fall or experienced change in No ?Accompanied By: family ?activities of daily living that may affect ?Transfer Assistance: None ?risk of falls: ?Patient Identification Verified: Yes ?Signs or symptoms of abuse/neglect since last visito No ?Secondary Verification Process Completed: Yes ?Hospitalized since last visit: No ?Patient Requires Transmission-Based Precautions: No ?Implantable device outside of the clinic excluding No ?Patient Has Alerts: Yes ?cellular tissue based products placed in the center ?Patient Alerts: ABI not obtainable since last visit: ?Has Dressing in Place as Prescribed: Yes ?Pain Present Now: No ?Electronic Signature(s) ?Signed: 02/23/2022 3:41:39 PM By: Samuella Bruin ?Entered By: Samuella Bruin on 02/23/2022 13:10:25 ?-------------------------------------------------------------------------------- ?Encounter Discharge Information Details ?Patient Name: Date of Service: ?[name] WN, [name] 02/23/2022 1:15 PM ?Medical Record Number: [id number] ?Patient Account Number: [id number] ?Date of Birth/Sex: Treating RN: ?October 18, 1931 (88 y.o. F) Boehlein, Linda ?Primary Care Aria Jarrard: SYSTEM, PRO V IDER Other Clinician: ?Referring Shiv Shuey: ?Treating Tayler Heiden/Extender: Duanne Guess ?Weeks in Treatment: 3 ?Encounter Discharge Information Items Post Procedure Vitals ?Discharge Condition:  Stable ?Temperature (F): 98.3 ?Ambulatory Status: Wheelchair ?Pulse (bpm): 87 ?Discharge Destination: Skilled Nursing Facility ?Respiratory Rate (breaths/min): 18 ?Telephoned: No ?Blood Pressure (mmHg): 138/84 ?Orders Sent: Yes ?Transportation: Other ?Accompanied By: family ?Schedule Follow-up Appointment: Yes ?Clinical Summary of Care: Patient Declined ?Notes ?facility transportation ?Electronic Signature(s) ?Signed: 02/23/2022 6:23:41 PM By: Zenaida Deed RN, BSN ?Entered By: Zenaida Deed on 02/23/2022 14:02:46 ?-------------------------------------------------------------------------------- ?Lower Extremity Assessment Details ?Patient Name: ?Date of Service: ?[name] WN, [name] 02/23/2022 1:15 PM ?Medical Record Number: [id number] ?Patient Account Number: [id number] ?Date of Birth/Sex: ?Treating RN: ?[birth year] (88 y.o. F) ?Primary Care Ravon Mcilhenny: SYSTEM, PRO V IDER ?Other Clinician: ?Referring Anayelli Lai: ?Treating Dorothyann Mourer/Extender: Duanne Guess ?Weeks in Treatment: 3 ?Edema Assessment ?Assessed: [Left: No] [Right: No] ?E[Left: dema] [Right: :] ?Calf ?Left: Right: ?Point of Measurement: 26 cm From Medial Instep 37.2 cm ?Ankle ?Left: Right: ?Point of Measurement: 10 cm From Medial Instep 25.4 cm ?Vascular Assessment ?Pulses: ?Dorsalis Pedis ?Palpable: [Left:No] ?Electronic Signature(s) ?Signed: 02/23/2022 3:41:39 PM By: Samuella Bruin ?Entered By: Samuella Bruin on 02/23/2022 13:19:11 ?-------------------------------------------------------------------------------- ?Multi Wound Chart Details ?Patient Name: ?Date of Service: ?[name] WN, [name] 02/23/2022 1:15 PM ?Medical Record Number: [id number] ?Patient Account Number: [id number] ?Date of Birth/Sex: ?Treating RN: ?May 18, 1931 (88 y.o. F) ?Primary Care Avneet Ashmore: SYSTEM, PRO V IDER ?Other Clinician: ?Referring Skylarr Liz: ?Treating Maureena Dabbs/Extender: Duanne Guess ?Weeks in Treatment: 3 ?Vital Signs ?Height(in): ?Pulse(bpm): 87 ?Weight(lbs): ?Blood  Pressure(mmHg): 138/84 ?Body Mass Index(BMI): ?Temperature(??F): 98.3 ?Respiratory Rate(breaths/min): 18 ?Photos: [1:Left Fernande Bras oe] [[name]/A:[name]/A [name]/A] ?Wound Location: [1:Gradually Appeared] [[name]/A:[name]/A] ?Wounding Event: [1:Diabetic Wound/Ulcer of the Lower] [[name]/A:[name]/A] ?Primary Etiology: [1:Extremity Arrhythmia, Congestive Heart Failure, [name]/A] ?Comorbid History: [1:Hypertension, Type II Diabetes, Gout, Osteoarthritis 09/29/2021] [[name]/A:[name]/A] ?Date Acquired: [1:3] [[name]/A:[name]/A] ?Weeks of Treatment: [1:Open] [[name]/A:[name]/A] ?Wound Status: [1:No] [[name]/A:[name]/A] ?Wound Recurrence: [1:0.6x0.6x0.2] [[name]/A:[name]/A] ?Measurements L x W x D (cm) [1:0.283] [[name]/A:[name]/A] ?A (cm?) : ?rea [1:0.057] [[name]/A:[name]/A] ?Volume (cm?) : [1:0.00%] [[name]/A:[name]/A] ?% Reduction in A [1:rea: 0.00%] [[name]/A:[name]/A] ?% Reduction in Volume: [1:Unable to visualize wound bed] [[name]/A:[name]/A] ?Classification: [1:Medium] [[name]/A:[name]/A] ?  Exudate A mount: [1:Serous] [[name]/A:[name]/A] ?Exudate Type: [1:amber] [[name]/A:[name]/A] ?Exudate Color: [1:Flat and Intact] [[name]/A:[name]/A] ?Wound Margin: [1:Small (1-33%)] [[name]/A:[name]/A] ?Granulation A mount: [1:Pink, Pale] [[name]/A:[name]/A] ?Granulation Quality: [1:Large (67-100%)] [[name]/A:[name]/A] ?Necrotic A mount: ?[1:Fat Layer (Subcutaneous Tissue): Yes [name]/A] ?Exposed Structures: ?[1:Fascia: No Tendon: No Muscle: No Joint: No Bone: No Small (1-33%)] [[name]/A:[name]/A] ?Epithelialization: [1:Debridement - Excisional] [[name]/A:[name]/A] ?Debridement: ?Pre-procedure Verification/Time Out 13:40 [[name]/A:[name]/A] ?Taken: [1:Lidocaine 4% Topical Solution] [[name]/A:[name]/A] ?Pain Control: [1:Callus, Subcutaneous, Slough] [[name]/A:[name]/A] ?Tissue Debrided: [1:Skin/Subcutaneous Tissue] [[name]/A:[name]/A] ?Level: [1:0.36] [[name]/A:[name]/A] ?Debridement A (sq cm): [1:rea Curette] [[name]/A:[name]/A] ?Instrument: [1:Minimum] [[name]/A:[name]/A] ?Bleeding: [1:Pressure] [[name]/A:[name]/A] ?Hemostasis A chieved: [1:4] [[name]/A:[name]/A] ?Procedural Pain: [1:2] [[name]/A:[name]/A] ?Post Procedural Pain: [1:Procedure was tolerated well] [[name]/A:[name]/A] ?Debridement Treatment Response: [1:0.6x0.6x0.1] [[name]/A:[name]/A] ?Post Debridement Measurements  L x ?W x D (cm) [1:0.028] [[name]/A:[name]/A] ?Post Debridement Volume: (cm?) [1:Debridement] [[name]/A:[name]/A] ?Treatment Notes ?Electronic Signature(s) ?Signed: 02/23/2022 1:54:19 PM By: Duanne Guess MD FACS ?Entered By: Duanne Guess on 02/23/2022 13:54:19 ?-------------------------------------------------------------------------------- ?Multi-Disciplinary Care Plan Details ?Patient Name: ?Date of Service: ?[name] WN, [name] 02/23/2022 1:15 PM ?Medical Record Number: [id number] ?Patient Account Number: [id number] ?Date of Birth/Sex: ?Treating RN: ?[birth year] (88 y.o. F) ?Primary Care Zayana Salvador: SYSTEM, PRO V IDER ?Other Clinician: ?Referring Venna Berberich: ?Treating Zyion Leidner/Extender: Duanne Guess ?Weeks in Treatment: 3 ?Multidisciplinary Care Plan reviewed with physician ?Active Inactive ?Nutrition ?Nursing Diagnoses: ?Impaired glucose control: actual or potential ?Potential for alteratiion in Nutrition/Potential for imbalanced nutrition ?Goals: ?Patient/caregiver will maintain therapeutic glucose control ?Date Initiated: 02/01/2022 ?Target Resolution Date: 03/04/2022 ?Goal Status: Active ?Interventions: ?Assess HgA1c results as ordered upon admission and as needed ?Assess patient nutrition upon admission and as needed per policy ?Provide education on elevated blood sugars and impact on wound healing ?Provide education on nutrition ?Treatment Activities: ?Education provided on Nutrition : 02/01/2022 ?Notes: ?Wound/Skin Impairment ?Nursing Diagnoses: ?Impaired tissue integrity ?Knowledge deficit related to ulceration/compromised skin integrity ?Goals: ?Patient/caregiver will verbalize understanding of skin care regimen ?Date Initiated: 02/01/2022 ?Target Resolution Date: 03/04/2022 ?Goal Status: Active ?Ulcer/skin breakdown will have a volume reduction of 30% by week 4 ?Date Initiated: 02/01/2022 ?Target Resolution Date: 03/04/2022 ?Goal Status: Active ?Interventions: ?Assess patient/caregiver ability to obtain necessary supplies ?Assess  patient/caregiver ability to perform ulcer/skin care regimen upon admission and as needed ?Assess ulceration(s) every visit ?Provide education on ulcer and skin care ?Notes: ?Electronic Signature(s) ?Signed: 02/23/2022 3:

## 2022-03-02 ENCOUNTER — Encounter (HOSPITAL_BASED_OUTPATIENT_CLINIC_OR_DEPARTMENT_OTHER): Payer: Medicare Other | Admitting: General Surgery

## 2022-03-02 DIAGNOSIS — E11621 Type 2 diabetes mellitus with foot ulcer: Secondary | ICD-10-CM | POA: Diagnosis not present

## 2022-03-02 NOTE — Progress Notes (Signed)
[name], [name] ([id number]) ?Visit Report for 03/02/2022 ?Arrival Information Details ?Patient Name: Date of Service: ?[name], [name] 03/02/2022 2:45 PM ?Medical Record Number: [id number] ?Patient Account Number: [id number] ?Date of Birth/Sex: Treating RN: ?[birth year] (88 y.o. F) Boehlein, Linda ?Primary Care Wynn Kernes: SYSTEM, PRO V IDER Other Clinician: ?Referring Delford Wingert: ?Treating Makhiya Coburn/Extender: Fredirick Maudlin ?Weeks in Treatment: 4 ?Visit Information History Since Last Visit ?Added or deleted any medications: No ?Patient Arrived: Wheel Chair ?Any new allergies or adverse reactions: No ?Arrival Time: 15:01 ?Had a fall or experienced change in No ?Accompanied By: family ?activities of daily living that may affect ?Transfer Assistance: None ?risk of falls: ?Patient Identification Verified: Yes ?Signs or symptoms of abuse/neglect since last visito No ?Secondary Verification Process Completed: Yes ?Hospitalized since last visit: No ?Patient Requires Transmission-Based Precautions: No ?Implantable device outside of the clinic excluding No ?Patient Has Alerts: Yes ?cellular tissue based products placed in the center ?Patient Alerts: ABI not obtainable since last visit: ?Has Dressing in Place as Prescribed: Yes ?Pain Present Now: No ?Electronic Signature(s) ?Signed: 03/02/2022 4:07:42 PM By: Adline Peals ?Entered By: Adline Peals on 03/02/2022 15:03:36 ?-------------------------------------------------------------------------------- ?Encounter Discharge Information Details ?Patient Name: Date of Service: ?[name], [name] 03/02/2022 2:45 PM ?Medical Record Number: [id number] ?Patient Account Number: [id number] ?Date of Birth/Sex: Treating RN: ?08-Mar-1931 (88 y.o. F) Boehlein, Linda ?Primary Care Andora Krull: SYSTEM, PRO V IDER Other Clinician: ?Referring Fard Borunda: ?Treating Ismerai Bin/Extender: Fredirick Maudlin ?Weeks in Treatment: 4 ?Encounter Discharge Information Items Post Procedure Vitals ?Discharge  Condition: Stable ?Temperature (F): 99 ?Ambulatory Status: Wheelchair ?Pulse (bpm): 109 ?Discharge Destination: [place] ?Respiratory Rate (breaths/min): 18 ?Telephoned: No ?Blood Pressure (mmHg): 185/71 ?Orders Sent: Yes ?Transportation: Other ?Accompanied By: son, facility staff ?Schedule Follow-up Appointment: Yes ?Clinical Summary of Care: Patient Declined ?Notes ?facility transportation ?Electronic Signature(s) ?Signed: 03/02/2022 6:08:52 PM By: Baruch Gouty RN, BSN ?Entered By: Baruch Gouty on 03/02/2022 15:37:50 ?-------------------------------------------------------------------------------- ?Lower Extremity Assessment Details ?Patient Name: ?Date of Service: ?[name], [name] 03/02/2022 2:45 PM ?Medical Record Number: [id number] ?Patient Account Number: [id number] ?Date of Birth/Sex: ?Treating RN: ?[birth year] (88 y.o. F) Boehlein, Linda ?Primary Care Sephiroth Mcluckie: SYSTEM, PRO V IDER ?Other Clinician: ?Referring Trinadee Verhagen: ?Treating Andra Matsuo/Extender: Fredirick Maudlin ?Weeks in Treatment: 4 ?Edema Assessment ?Assessed: [Left: No] [Right: No] ?E[Left: dema] [Right: :] ?Calf ?Left: Right: ?Point of Measurement: 26 cm From Medial Instep 41.8 cm ?Ankle ?Left: Right: ?Point of Measurement: 10 cm From Medial Instep 26.9 cm ?Vascular Assessment ?Pulses: ?Dorsalis Pedis ?Palpable: [Left:No] ?Electronic Signature(s) ?Signed: 03/02/2022 4:07:42 PM By: Adline Peals ?Signed: 03/02/2022 6:08:52 PM By: Baruch Gouty RN, BSN ?Entered By: Adline Peals on 03/02/2022 15:06:26 ?-------------------------------------------------------------------------------- ?Multi Wound Chart Details ?Patient Name: ?Date of Service: ?[name], [name] 03/02/2022 2:45 PM ?Medical Record Number: [id number] ?Patient Account Number: [id number] ?Date of Birth/Sex: ?Treating RN: ?[birth year] (88 y.o. F) Boehlein, Linda ?Primary Care Kaisa Wofford: SYSTEM, PRO V IDER ?Other Clinician: ?Referring Sequoia Mincey: ?Treating  Brizza Nathanson/Extender: Fredirick Maudlin ?Weeks in Treatment: 4 ?Vital Signs ?Height(in): ?Pulse(bpm): 109 ?Weight(lbs): ?Blood Pressure(mmHg): 185/71 ?Body Mass Index(BMI): ?Temperature(??F): 99 ?Respiratory Rate(breaths/min): 18 ?Photos: [1:Left Darien Ramus oe] [N/A:N/A N/A] ?Wound Location: [1:Gradually Appeared] [N/A:N/A] ?Wounding Event: [1:Diabetic Wound/Ulcer of the Lower] [N/A:N/A] ?Primary Etiology: [1:Extremity Arrhythmia, Congestive Heart Failure, N/A] ?Comorbid History: [1:Hypertension, Type II Diabetes, Gout, Osteoarthritis 09/29/2021] [N/A:N/A] ?Date Acquired: [1:4] [N/A:N/A] ?Weeks of Treatment: [1:Open] [N/A:N/A] ?Wound Status: [1:No] [N/A:N/A] ?Wound Recurrence: [1:0.6x0.5x0.2] [N/A:N/A] ?Measurements L x W x D (cm) [1:0.236] [N/A:N/A] ?A (cm?) : ?rea [1:0.047] [N/A:N/A] ?Volume (cm?) : [1:16.60%] [N/A:N/A] ?% Reduction in  A [1:rea: 17.50%] [N/A:N/A] ?% Reduction in Volume: [1:Unable to visualize wound bed] [N/A:N/A] ?Classification: [1:Medium] [N/A:N/A] ?Exudate A mount: [1:Serous] [N/A:N/A] ?Exudate Type: [1:amber] [N/A:N/A] ?Exudate Color: [1:Flat and Intact] [N/A:N/A] ?Wound Margin: [1:Small (1-33%)] [N/A:N/A] ?Granulation A mount: [1:Pink, Pale] [N/A:N/A] ?Granulation Quality: [1:Large (67-100%)] [N/A:N/A] ?Necrotic A mount: ?[1:Fat Layer (Subcutaneous Tissue): Yes N/A] ?Exposed Structures: ?[1:Fascia: No Tendon: No Muscle: No Joint: No Bone: No Small (1-33%)] [N/A:N/A] ?Epithelialization: [1:Debridement - Excisional] [N/A:N/A] ?Debridement: ?Pre-procedure Verification/Time Out 15:20 [N/A:N/A] ?Taken: [1:Lidocaine 4% Topical Solution] [N/A:N/A] ?Pain Control: [1:Subcutaneous, Slough] [N/A:N/A] ?Tissue Debrided: [1:Skin/Subcutaneous Tissue] [N/A:N/A] ?Level: [1:0.3] [N/A:N/A] ?Debridement A (sq cm): [1:rea Curette] [N/A:N/A] ?Instrument: [1:Minimum] [N/A:N/A] ?Bleeding: [1:Pressure] [N/A:N/A] ?Hemostasis A chieved: [1:2] [N/A:N/A] ?Procedural Pain: [1:0] [N/A:N/A] ?Post Procedural Pain: [1:Procedure was  tolerated well] [N/A:N/A] ?Debridement Treatment Response: [1:0.6x0.5x0.2] [N/A:N/A] ?Post Debridement Measurements L x ?W x D (cm) [1:0.047] [N/A:N/A] ?Post Debridement Volume: (cm?) [1:Debridement] [N/A:N/A] ?Treatment Notes ?Wound #1 (Toe Great) Wound Laterality: Left ?Cleanser ?Wound Cleanser ?Discharge Instruction: Cleanse the wound with wound cleanser prior to applying a clean dressing using gauze sponges, not tissue or cotton balls. ?Peri-Wound Care ?Topical ?Primary Dressing ?Hydrofera Blue Classic Foam, 2x2 in ?Discharge Instruction: Moisten with saline prior to applying to wound bed, apply after Santyl ?Santyl Ointment ?Discharge Instruction: Apply nickel thick amount to wound bed, cover with Hydrofera Blue ?Secondary Dressing ?Woven Gauze Sponges 2x2 in ?Discharge Instruction: Apply over primary dressing as directed. ?Secured With ?Conforming Stretch Gauze Bandage, Sterile 2x75 (in/in) ?Discharge Instruction: Secure with stretch gauze as directed. ?Paper Tape, 1x10 (in/yd) ?Discharge Instruction: Secure dressing with tape as directed. ?Compression Wrap ?Compression Stockings ?Add-Ons ?Electronic Signature(s) ?Signed: 03/02/2022 3:38:06 PM By: Fredirick Maudlin MD FACS ?Signed: 03/02/2022 6:08:52 PM By: Baruch Gouty RN, BSN ?Entered By: Fredirick Maudlin on 03/02/2022 15:38:06 ?-------------------------------------------------------------------------------- ?Multi-Disciplinary Care Plan Details ?Patient Name: ?Date of Service: ?[name], [name] 03/02/2022 2:45 PM ?Medical Record Number: [id number] ?Patient Account Number: [id number] ?Date of Birth/Sex: ?Treating RN: ?Sep 04, 1931 (88 y.o. F) Boehlein, Linda ?Primary Care Loui Massenburg: SYSTEM, PRO V IDER ?Other Clinician: ?Referring Lizann Edelman: ?Treating Greogry Goodwyn/Extender: Fredirick Maudlin ?Weeks in Treatment: 4 ?Multidisciplinary Care Plan reviewed with physician ?Active Inactive ?Nutrition ?Nursing Diagnoses: ?Impaired glucose control: actual or  potential ?Potential for alteratiion in Nutrition/Potential for imbalanced nutrition ?Goals: ?Patient/caregiver will maintain therapeutic glucose control ?Date Initiated: 02/01/2022 ?Target Resolution Date: 03/31/2022 ?Goal Status: Active

## 2022-03-02 NOTE — Progress Notes (Signed)
VERYL, ABRIL ([id number]) ?Visit Report for 03/02/2022 ?Chief Complaint Document Details ?Patient Name: Date of Service: ?[name], Ajai S. 03/02/2022 2:45 PM ?Medical Record Number: [id number] ?Patient Account Number: [id number] ?Date of Birth/Sex: Treating RN: ?04/10/1931 (88 y.o. F) Boehlein, Linda ?Primary Care Provider: SYSTEM, PRO V IDER Other Clinician: ?Referring Provider: ?Treating Provider/Extender: Duanne Guess ?Weeks in Treatment: 4 ?Information Obtained from: Patient ?Chief Complaint ?Patient presents to the wound care center with open non-healing surgical wound(s) ?Electronic Signature(s) ?Signed: 03/02/2022 3:38:29 PM By: Duanne Guess MD FACS ?Entered By: Duanne Guess on 03/02/2022 15:38:28 ?-------------------------------------------------------------------------------- ?Debridement Details ?Patient Name: Date of Service: ?[name], Gisel S. 03/02/2022 2:45 PM ?Medical Record Number: [id number] ?Patient Account Number: [id number] ?Date of Birth/Sex: Treating RN: ?20-Jun-1931 (88 y.o. F) Boehlein, Linda ?Primary Care Provider: SYSTEM, PRO V IDER Other Clinician: ?Referring Provider: ?Treating Provider/Extender: Duanne Guess ?Weeks in Treatment: 4 ?Debridement Performed for Assessment: Wound #1 Left T Great ?oe ?Performed By: Physician Duanne Guess, MD ?Debridement Type: Debridement ?Severity of Tissue Pre Debridement: Fat layer exposed ?Level of Consciousness (Pre-procedure): Awake and Alert ?Pre-procedure Verification/Time Out Yes - 15:20 ?Taken: ?Start Time: 15:22 ?Pain Control: Lidocaine 4% T opical Solution ?T Area Debrided (L x W): ?otal 0.6 (cm) x 0.5 (cm) = 0.3 (cm?) ?Tissue and other material debrided: Viable, Non-Viable, Slough, Subcutaneous, Slough ?Level: Skin/Subcutaneous Tissue ?Debridement Description: Excisional ?Instrument: Curette ?Bleeding: Minimum ?Hemostasis Achieved: Pressure ?Procedural Pain: 2 ?Post Procedural Pain: 0 ?Response to Treatment: Procedure was  tolerated well ?Level of Consciousness (Post- Awake and Alert ?procedure): ?Post Debridement Measurements of Total Wound ?Length: (cm) 0.6 ?Width: (cm) 0.5 ?Depth: (cm) 0.2 ?Volume: (cm?) 0.047 ?Character of Wound/Ulcer Post Debridement: Improved ?Severity of Tissue Post Debridement: Fat layer exposed ?Post Procedure Diagnosis ?Same as Pre-procedure ?Electronic Signature(s) ?Signed: 03/02/2022 4:03:20 PM By: Duanne Guess MD FACS ?Signed: 03/02/2022 6:08:52 PM By: Zenaida Deed RN, BSN ?Entered By: Zenaida Deed on 03/02/2022 15:24:13 ?-------------------------------------------------------------------------------- ?HPI Details ?Patient Name: Date of Service: ?[name], Emyah S. 03/02/2022 2:45 PM ?Medical Record Number: [id number] ?Patient Account Number: [id number] ?Date of Birth/Sex: Treating RN: ?[birth year] (88 y.o. F) Boehlein, Linda ?Primary Care Provider: SYSTEM, PRO V IDER Other Clinician: ?Referring Provider: ?Treating Provider/Extender: Duanne Guess ?Weeks in Treatment: 4 ?History of Present Illness ?HPI Description: ADMISSION ?02/01/2022 ?This is an 88 year old woman who is a resident of [facility]. She has a past medical history notable for type 2 diabetes mellitus, congestive heart failure, ?pulmonary hypertension, and complete heart block. She is accompanied today by her son. Approximately 4 months ago, she had an ingrown toe nail removed. ?Apparently this went a bit deep. Since that time, it has not healed. The patient is nonambulatory. Apparently they have been applying Santyl to the site at her ?facility. She was on antibiotics but I am not certain which specific drugs she took. She completed her last course a few weeks ago. We were unable to obtain ?ABIs in clinic secondary to the patient's body habitus. She has not had any formal vascular studies nor any x-ray of the area. ?02/09/2022: The wound is roughly the same size today, but the amount of slough in the base has decreased significantly.  She apparently had an x-ray done at ?her facility, but we do not have the report; per verbal report from facility staff, "there was nothing there." We had requested formal ABI/TBI studies, but these ?were not done. She has been in [place] under [employer]	[place]. ?02/16/2022: The wound measured about the same today, but to my eye, it appears  rather smaller. The slough in the base is minimal. Her vascular studies are ?scheduled for Friday. We have been using Santyl under Hydrofera Blue. No concern for infection. ?02/23/2022: Once again, the wound has been measured about the same size, but I think it looks a bit smaller. There is slough at the base. She had her ABIs done ?last week and these show severe bilateral peripheral arterial disease. We received the report of the foot x-ray that was ordered. There is no concern for ?osteomyelitis at this time. ?1223: The wound is smaller today with a small amount of slough at the base. We placed a referral to vein and vascular surgery last week in response to her ?severe peripheral arterial disease, but she does not yet have an appointment on the books. ?Electronic Signature(s) ?Signed: 03/02/2022 3:39:19 PM By: Duanne Guess MD FACS ?Entered By: Duanne Guess on 03/02/2022 15:39:18 ?-------------------------------------------------------------------------------- ?Physical Exam Details ?Patient Name: Date of Service: ?[name], Betty S. 03/02/2022 2:45 PM ?Medical Record Number: [id number] ?Patient Account Number: [id number] ?Date of Birth/Sex: Treating RN: ?1931/07/28 (88 y.o. F) Boehlein, Linda ?Primary Care Provider: SYSTEM, PRO V IDER Other Clinician: ?Referring Provider: ?Treating Provider/Extender: Duanne Guess ?Weeks in Treatment: 4 ?Constitutional ?She is hypertensive, but asymptomatic.[name] Slightly tachycardic, asymptomatic.. . . No acute distress. ?Respiratory ?Normal work of breathing on room air. ?Notes ?03/02/2022: The circular wound on her left great toe is smaller  today with a small amount of slough. No concern for infection. ?Electronic Signature(s) ?Signed: 03/02/2022 3:40:25 PM By: Duanne Guess MD FACS ?Entered By: Duanne Guess on 03/02/2022 15:40:25 ?-------------------------------------------------------------------------------- ?Physician Orders Details ?Patient Name: Date of Service: ?[name], Ashna S. 03/02/2022 2:45 PM ?Medical Record Number: [id number] ?Patient Account Number: [id number] ?Date of Birth/Sex: Treating RN: ?February 01, 1931 (88 y.o. F) Boehlein, Linda ?Primary Care Provider: SYSTEM, PRO V IDER Other Clinician: ?Referring Provider: ?Treating Provider/Extender: Duanne Guess ?Weeks in Treatment: 4 ?Verbal / Phone Orders: No ?Diagnosis Coding ?ICD-10 Coding ?Code Description ?E11.621 Type 2 diabetes mellitus with foot ulcer ?Q33.007 Non-pressure chronic ulcer of other part of left foot with fat layer exposed ?I50.32 Chronic diastolic (congestive) heart failure ?I27.20 Pulmonary hypertension, unspecified ?Follow-up Appointments ?ppointment in 1 week. - Dr. Lady Gary - Room 1 ?Return A ?Bathing/ Shower/ Hygiene ?May shower with protection but do not get wound dressing(s) wet. ?Off-Loading ?Other: - No direct pressure on toes ?Wound Treatment ?Wound #1 - T Great ?oe Wound Laterality: Left ?Cleanser: Wound Cleanser 1 x Per Day ?Discharge Instructions: Cleanse the wound with wound cleanser prior to applying a clean dressing using gauze sponges, not tissue or cotton balls. ?Prim Dressing: Hydrofera Blue Classic Foam, 2x2 in 1 x Per Day ?ary ?Discharge Instructions: Moisten with saline prior to applying to wound bed, apply after Santyl ?Prim Dressing: Santyl Ointment 1 x Per Day ?ary ?Discharge Instructions: Apply nickel thick amount to wound bed, cover with Hydrofera Blue ?Secondary Dressing: Woven Gauze Sponges 2x2 in 1 x Per Day ?Discharge Instructions: Apply over primary dressing as directed. ?Secured With: Insurance underwriter, Sterile 2x75  (in/in) 1 x Per Day ?Discharge Instructions: Secure with stretch gauze as directed. ?Secured With: Paper Tape, 1x10 (in/yd) 1 x Per Day ?Discharge Instructions: Secure dressing with tape as directed. ?Patient Medi

## 2022-03-09 ENCOUNTER — Encounter (HOSPITAL_BASED_OUTPATIENT_CLINIC_OR_DEPARTMENT_OTHER): Payer: Medicare Other | Admitting: General Surgery

## 2022-03-09 DIAGNOSIS — E11621 Type 2 diabetes mellitus with foot ulcer: Secondary | ICD-10-CM | POA: Diagnosis not present

## 2022-03-09 NOTE — Progress Notes (Signed)
JADDA, HUNSUCKER ([id number]) ?Visit Report for 03/09/2022 ?Arrival Information Details ?Patient Name: Date of Service: ?[name], [name] 03/09/2022 2:45 PM ?Medical Record Number: [id number] ?Patient Account Number: [id number] ?Date of Birth/Sex: Treating RN: ?Sep 10, 1931 (88 y.o. F) Boehlein, Linda ?Primary Care Monica Codd: SYSTEM, PRO V IDER Other Clinician: ?Referring Radiah Lubinski: ?Treating Kortney Potvin/Extender: Duanne Guess ?Weeks in Treatment: 5 ?Visit Information History Since Last Visit ?Added or deleted any medications: No ?Patient Arrived: Wheel Chair ?Any new allergies or adverse reactions: No ?Arrival Time: 15:12 ?Had a fall or experienced change in No ?Accompanied By: caregiver ?activities of daily living that may affect ?Transfer Assistance: None ?risk of falls: ?Patient Identification Verified: Yes ?Signs or symptoms of abuse/neglect since last visito No ?Secondary Verification Process Completed: Yes ?Hospitalized since last visit: No ?Patient Requires Transmission-Based Precautions: No ?Implantable device outside of the clinic excluding No ?Patient Has Alerts: Yes ?cellular tissue based products placed in the center ?Patient Alerts: ABI not obtainable since last visit: ?Has Dressing in Place as Prescribed: Yes ?Pain Present Now: Yes ?Electronic Signature(s) ?Signed: 03/09/2022 3:16:57 PM By: Karl Ito ?Entered By: Karl Ito on 03/09/2022 15:13:18 ?-------------------------------------------------------------------------------- ?Encounter Discharge Information Details ?Patient Name: Date of Service: ?[name], [name] 03/09/2022 2:45 PM ?Medical Record Number: [id number] ?Patient Account Number: [id number] ?Date of Birth/Sex: Treating RN: ?October 14, 1931 (88 y.o. F) Boehlein, Linda ?Primary Care Eliyohu Class: SYSTEM, PRO V IDER Other Clinician: ?Referring Madden Garron: ?Treating Gabriell Daigneault/Extender: Duanne Guess ?Weeks in Treatment: 5 ?Encounter Discharge Information Items Post Procedure Vitals ?Discharge  Condition: Stable ?Temperature (F): 98.3 ?Ambulatory Status: Wheelchair ?Pulse (bpm): 87 ?Discharge Destination: Skilled Nursing Facility ?Respiratory Rate (breaths/min): 18 ?Orders Sent: Yes ?Blood Pressure (mmHg): 154/75 ?Transportation: Private Auto ?Accompanied By: facility personnel ?Schedule Follow-up Appointment: Yes ?Clinical Summary of Care: Patient Declined ?Electronic Signature(s) ?Signed: 03/09/2022 4:07:39 PM By: Samuella Bruin ?Entered By: Samuella Bruin on 03/09/2022 15:50:12 ?-------------------------------------------------------------------------------- ?Lower Extremity Assessment Details ?Patient Name: ?Date of Service: ?[name], [name] 03/09/2022 2:45 PM ?Medical Record Number: [id number] ?Patient Account Number: [id number] ?Date of Birth/Sex: ?Treating RN: ?Nov 02, 1931 (88 y.o. F) Boehlein, Linda ?Primary Care Alleyah Twombly: SYSTEM, PRO V IDER ?Other Clinician: ?Referring Morey Andonian: ?Treating Lamija Besse/Extender: Duanne Guess ?Weeks in Treatment: 5 ?Edema Assessment ?Assessed: [Left: No] [Right: No] ?E[Left: dema] [Right: :] ?Calf ?Left: Right: ?Point of Measurement: 26 cm From Medial Instep 39.5 cm ?Ankle ?Left: Right: ?Point of Measurement: 10 cm From Medial Instep 25 cm ?Vascular Assessment ?Pulses: ?Dorsalis Pedis ?Palpable: [Left:Yes] ?Electronic Signature(s) ?Signed: 03/09/2022 4:07:39 PM By: Samuella Bruin ?Signed: 03/09/2022 5:06:30 PM By: Zenaida Deed RN, BSN ?Entered By: Samuella Bruin on 03/09/2022 15:30:03 ?-------------------------------------------------------------------------------- ?Multi-Disciplinary Care Plan Details ?Patient Name: ?Date of Service: ?[name], [name] 03/09/2022 2:45 PM ?Medical Record Number: [id number] ?Patient Account Number: [id number] ?Date of Birth/Sex: ?Treating RN: ?[birth year] (88 y.o. F) Boehlein, Linda ?Primary Care Hollin Crewe: SYSTEM, PRO V IDER ?Other Clinician: ?Referring Havannah Streat: ?Treating Calla Wedekind/Extender: Duanne Guess ?Weeks in  Treatment: 5 ?Multidisciplinary Care Plan reviewed with physician ?Active Inactive ?Nutrition ?Nursing Diagnoses: ?Impaired glucose control: actual or potential ?Potential for alteratiion in Nutrition/Potential for imbalanced nutrition ?Goals: ?Patient/caregiver will maintain therapeutic glucose control ?Date Initiated: 02/01/2022 ?Target Resolution Date: 03/31/2022 ?Goal Status: Active ?Interventions: ?Assess HgA1c results as ordered upon admission and as needed ?Assess patient nutrition upon admission and as needed per policy ?Provide education on elevated blood sugars and impact on wound healing ?Provide education on nutrition ?Treatment Activities: ?Education provided on Nutrition : 02/01/2022 ?Notes: ?Wound/Skin Impairment ?Nursing Diagnoses: ?Impaired tissue integrity ?Knowledge deficit related to ulceration/compromised skin integrity ?Goals: ?  Patient/caregiver will verbalize understanding of skin care regimen ?Date Initiated: 02/01/2022 ?Target Resolution Date: 03/31/2022 ?Goal Status: Active ?Ulcer/skin breakdown will have a volume reduction of 30% by week 4 ?Date Initiated: 02/01/2022 ?Target Resolution Date: 03/31/2022 ?Goal Status: Active ?Interventions: ?Assess patient/caregiver ability to obtain necessary supplies ?Assess patient/caregiver ability to perform ulcer/skin care regimen upon admission and as needed ?Assess ulceration(s) every visit ?Provide education on ulcer and skin care ?Notes: ?Electronic Signature(s) ?Signed: 03/09/2022 4:07:39 PM By: Samuella Bruin ?Signed: 03/09/2022 5:06:30 PM By: Zenaida Deed RN, BSN ?Entered By: Samuella Bruin on 03/09/2022 15:32:04 ?-------------------------------------------------------------------------------- ?Pain Assessment Details ?Patient Name: ?Date of Service: ?[name], [name] 03/09/2022 2:45 PM ?Medical Record Number: [id number] ?Patient Account Number: [id number] ?Date of Birth/Sex: ?Treating RN: ?13-Mar-1931 (88 y.o. F) Boehlein, Linda ?Primary Care  Jacai Kipp: SYSTEM, PRO V IDER ?Other Clinician: ?Referring Veronica Guerrant: ?Treating Stran Raper/Extender: Duanne Guess ?Weeks in Treatment: 5 ?Active Problems ?Location of Pain Severity and Description of Pain ?Patient Has Paino Yes ?Site Locations ?Rate the pain. ?Current Pain Level: 8 ?Pain Management and Medication ?Current Pain Management: ?Electronic Signature(s) ?Signed: 03/09/2022 3:16:57 PM By: Karl Ito ?Signed: 03/09/2022 5:06:30 PM By: Zenaida Deed RN, BSN ?Entered By: Karl Ito on 03/09/2022 15:13:51 ?-------------------------------------------------------------------------------- ?Patient/Caregiver Education Details ?Patient Name: ?Date of Service: ?[name], [name] 4/19/2023andnbsp2:45 PM ?Medical Record Number: [id number] ?Patient Account Number: [id number] ?Date of Birth/Gender: ?Treating RN: ?[birth year] (88 y.o. F) Boehlein, Linda ?Primary Care Physician: SYSTEM, PRO V IDER ?Other Clinician: ?Referring Physician: ?Treating Physician/Extender: Duanne Guess ?Weeks in Treatment: 5 ?Education Assessment ?Education Provided To: ?Patient ?Education Topics Provided ?Wound/Skin Impairment: ?Methods: Explain/Verbal ?Responses: Reinforcements needed, State content correctly ?Electronic Signature(s) ?Signed: 03/09/2022 4:07:39 PM By: Samuella Bruin ?Entered By: Samuella Bruin on 03/09/2022 15:32:25 ?-------------------------------------------------------------------------------- ?Wound Assessment Details ?Patient Name: ?Date of Service: ?[name], [name] 03/09/2022 2:45 PM ?Medical Record Number: [id number] ?Patient Account Number: [id number] ?Date of Birth/Sex: ?Treating RN: ?1931-09-08 (88 y.o. F) Boehlein, Linda ?Primary Care Tegan Britain: SYSTEM, PRO V IDER ?Other Clinician: ?Referring Huntley Demedeiros: ?Treating Chima Astorino/Extender: Duanne Guess ?Weeks in Treatment: 5 ?Wound Status ?Wound Number: 1 Primary Diabetic Wound/Ulcer of the Lower Extremity ?Etiology: ?Wound Location: Left T  Great ?oe ?Wound Open ?Wounding Event: Gradually Appeared ?Status: ?Date Acquired: 09/29/2021 ?Comorbid Arrhythmia, Congestive Heart Failure, Hypertension, Type II ?Weeks Of Treatment: 5 ?History: Diabetes, Gout, Oste

## 2022-03-09 NOTE — Progress Notes (Signed)
MELLANY, DINSMORE ([id number]) ?Visit Report for 03/09/2022 ?Debridement Details ?Patient Name: Date of Service: ?[name] [name] S. 03/09/2022 2:45 PM ?Medical Record Number: [id number] ?Patient Account Number: [id number] ?Date of Birth/Sex: Treating RN: ?[birth year] (88 y.o. F) Boehlein, Linda ?Primary Care Provider: SYSTEM, PRO V IDER Other Clinician: ?Referring Provider: ?Treating Provider/Extender: Duanne Guess ?Weeks in Treatment: 5 ?Debridement Performed for Assessment: Wound #1 Left T Great ?oe ?Performed By: Physician Duanne Guess, MD ?Debridement Type: Debridement ?Severity of Tissue Pre Debridement: Fat layer exposed ?Level of Consciousness (Pre-procedure): Awake and Alert ?Pre-procedure Verification/Time Out Yes - 15:37 ?Taken: ?Start Time: 15:37 ?Pain Control: Lidocaine 4% T opical Solution ?T Area Debrided (L x W): ?otal 0.4 (cm) x 0.5 (cm) = 0.2 (cm?) ?Tissue and other material debrided: Viable, Non-Viable, Slough, Subcutaneous, Slough ?Level: Skin/Subcutaneous Tissue ?Debridement Description: Excisional ?Instrument: Curette ?Bleeding: Minimum ?Hemostasis Achieved: Pressure ?Procedural Pain: 0 ?Post Procedural Pain: 0 ?Response to Treatment: Procedure was tolerated well ?Level of Consciousness (Post- Awake and Alert ?procedure): ?Post Debridement Measurements of Total Wound ?Length: (cm) 0.4 ?Width: (cm) 0.5 ?Depth: (cm) 0.2 ?Volume: (cm?) 0.031 ?Character of Wound/Ulcer Post Debridement: Improved ?Severity of Tissue Post Debridement: Fat layer exposed ?Post Procedure Diagnosis ?Same as Pre-procedure ?Electronic Signature(s) ?Signed: 03/09/2022 4:07:39 PM By: Samuella Bruin ?Signed: 03/09/2022 4:46:17 PM By: Duanne Guess MD FACS ?Signed: 03/09/2022 5:06:30 PM By: Zenaida Deed RN, BSN ?Entered By: Samuella Bruin on 03/09/2022 15:38:18 ?-------------------------------------------------------------------------------- ?Physician Orders Details ?Patient Name: Date of Service: ?[name] [name]  S. 03/09/2022 2:45 PM ?Medical Record Number: [id number] ?Patient Account Number: [id number] ?Date of Birth/Sex: Treating RN: ?[birth year] (88 y.o. F) Boehlein, Linda ?Primary Care Provider: SYSTEM, PRO V IDER Other Clinician: ?Referring Provider: ?Treating Provider/Extender: Duanne Guess ?Weeks in Treatment: 5 ?Verbal / Phone Orders: No ?Diagnosis Coding ?ICD-10 Coding ?Code Description ?E11.621 Type 2 diabetes mellitus with foot ulcer ?A76.811 Non-pressure chronic ulcer of other part of left foot with fat layer exposed ?I50.32 Chronic diastolic (congestive) heart failure ?I27.20 Pulmonary hypertension, unspecified ?Follow-up Appointments ?ppointment in 1 week. - Dr. Lady Gary - Room 1 ?Return A ?Bathing/ Shower/ Hygiene ?May shower with protection but do not get wound dressing(s) wet. ?Off-Loading ?Other: - No direct pressure on toes ?Wound Treatment ?Wound #1 - T Great ?oe Wound Laterality: Left ?Cleanser: Wound Cleanser 1 x Per Day ?Discharge Instructions: Cleanse the wound with wound cleanser prior to applying a clean dressing using gauze sponges, not tissue or cotton balls. ?Prim Dressing: Hydrofera Blue Classic Foam, 2x2 in 1 x Per Day ?ary ?Discharge Instructions: Moisten with saline prior to applying to wound bed, apply after Santyl ?Prim Dressing: Santyl Ointment 1 x Per Day ?ary ?Discharge Instructions: Apply nickel thick amount to wound bed, cover with Hydrofera Blue ?Secondary Dressing: Woven Gauze Sponges 2x2 in 1 x Per Day ?Discharge Instructions: Apply over primary dressing as directed. ?Secured With: Insurance underwriter, Sterile 2x75 (in/in) 1 x Per Day ?Discharge Instructions: Secure with stretch gauze as directed. ?Secured With: Paper Tape, 1x10 (in/yd) 1 x Per Day ?Discharge Instructions: Secure dressing with tape as directed. ?Patient Medications ?llergies: aspirin, tramadol, naproxen, shellfish derived ?A ?Notifications Medication Indication Start End ?prior to debridement  03/09/2022 ?lidocaine ?DOSE topical 4 % cream - cream topical ?Electronic Signature(s) ?Signed: 03/09/2022 4:07:39 PM By: Samuella Bruin ?Signed: 03/09/2022 4:46:17 PM By: Duanne Guess MD FACS ?Entered By: Samuella Bruin on 03/09/2022 15:39:20 ?-------------------------------------------------------------------------------- ?Problem List Details ?Patient Name: ?Date of Service: ?[name] [name] S. 03/09/2022 2:45 PM ?Medical Record Number: [id number] ?Patient Account Number:  [id number] ?Date of Birth/Sex: ?Treating RN: ?02-13-1931 (88 y.o. F) Boehlein, Linda ?Primary Care Provider: SYSTEM, PRO V IDER ?Other Clinician: ?Referring Provider: ?Treating Provider/Extender: Duanne Guess ?Weeks in Treatment: 5 ?Active Problems ?ICD-10 ?Encounter ?Code Description Active Date MDM ?Diagnosis ?E11.621 Type 2 diabetes mellitus with foot ulcer 02/01/2022 No Yes ?F79.024 Non-pressure chronic ulcer of other part of left foot with fat layer exposed 02/01/2022 No Yes ?I50.32 Chronic diastolic (congestive) heart failure 02/01/2022 No Yes ?I27.20 Pulmonary hypertension, unspecified 02/01/2022 No Yes ?Inactive Problems ?Resolved Problems ?Electronic Signature(s) ?Signed: 03/09/2022 4:07:39 PM By: Samuella Bruin ?Signed: 03/09/2022 4:46:17 PM By: Duanne Guess MD FACS ?Entered By: Samuella Bruin on 03/09/2022 15:32:36 ?-------------------------------------------------------------------------------- ?SuperBill Details ?Patient Name: ?Date of Service: ?[name] [name] S. 03/09/2022 ?Medical Record Number: [id number] ?Patient Account Number: [id number] ?Date of Birth/Sex: ?Treating RN: ?Jul 16, 1931 (88 y.o. F) Boehlein, Linda ?Primary Care Provider: SYSTEM, PRO V IDER ?Other Clinician: ?Referring Provider: ?Treating Provider/Extender: Duanne Guess ?Weeks in Treatment: 5 ?Diagnosis Coding ?ICD-10 Codes ?Code Description ?E11.621 Type 2 diabetes mellitus with foot ulcer ?M42.683 Non-pressure chronic ulcer of other part of left  foot with fat layer exposed ?I50.32 Chronic diastolic (congestive) heart failure ?I27.20 Pulmonary hypertension, unspecified ?Facility Procedures ?CPT4 Code: [id number] ?Description: 11042 - DEB SUBQ TISSUE 20 SQ CM/< ICD-10 Diagnosis Description L97.522 Non-pressure chronic ulcer of other part of left foot with fat layer exposed ?Modifier: ?Quantity: 1 ?Physician Procedures ?: CPT4 Code Description Modifier [id number] 11042 - WC PHYS SUBQ TISS 20 SQ CM ICD-10 Diagnosis Description L97.522 Non-pressure chronic ulcer of other part of left foot with fat layer exposed ?Quantity: 1 ?Electronic Signature(s) ?Signed: 03/09/2022 4:07:39 PM By: Samuella Bruin ?Signed: 03/09/2022 4:46:17 PM By: Duanne Guess MD FACS ?Entered By: Samuella Bruin on 03/09/2022 16:03:33 ?

## 2022-03-14 NOTE — Progress Notes (Signed)
[facility] [place] ? ?ASSESSMENT / PLAN: ?ATALIE [name] is a 88 y.o. female with atherosclerosis of native arteries of left lower extremity causing ulceration. ? ?Recommend the following which can slow the progression of atherosclerosis and reduce the risk of major adverse cardiac / limb events:  ?Complete cessation from all tobacco products. ?Blood glucose control with goal A1c < 7%. ?Blood pressure control with goal blood pressure < 140/90 mmHg. ?Lipid reduction therapy with goal LDL-C <100 mg/dL (<69 if symptomatic from PAD).  ?Aspirin 81mg  PO QD.  ?Atorvastatin 40-80mg  PO QD (or other "high intensity" statin therapy). ? ?The patient is not a good candidate for revascularization.  I have encouraged her to try local wound care for another month.  She thinks this is reasonable.  I will see her again at that time to discuss an angiogram. ? ?CHIEF COMPLAINT: Ulcer about left great toe. ? ?HISTORY OF PRESENT ILLNESS: ?[name] is a 88 y.o. female referred to clinic from the wound care center for evaluation of a left great toe ulcer.  She has been under the care of Dr. Lady Gary.  The patient is an elderly, nonambulatory, nursing home resident who is morbidly obese but is mentally quite sharp.  She reports the ulcer has been getting slowly better with local wound care. ? ?Past Medical History:  ?Diagnosis Date  ? Arthritis   ? "qwhere"  ? AV block   ?  2:1  January 14, 2011 / pacemaker plan when cellulitis resolved in length  ? Bradycardia   ? 2:1  AV block  ? Cellulitis   ? Superficial cellulitis right lower leg   January 14, 2011  ? Chronic diastolic CHF (congestive heart failure) (HCC)   ? Diabetes mellitus without complication (HCC)   ? Edema   ? . EF 65-70%, echo, November 29, 2010  ? Ejection fraction   ? EF 65%, echo, January, 2012  ? Hypertension   ? Hypokalemia   ? October, 2012  ? Murmur   ? No significant valvular disease by echo, January, 2012  ? Pacemaker   ? January, 2013  Medtronic Sensa  ? Pulmonary hypertension (HCC)   ?  46 mmHg... echo... January, 2012  ? ? ?Past Surgical History:  ?Procedure Laterality Date  ? CHOLECYSTECTOMY    ? HIP ARTHROPLASTY Right   ? INSERT / REPLACE / REMOVE PACEMAKER  11/2011  ? PARTIAL HYSTERECTOMY    ? vaginal  ? PERMANENT PACEMAKER INSERTION N/A 12/09/2011  ? Procedure: PERMANENT PACEMAKER INSERTION;  Surgeon: Marinus Maw, MD;  Location: [facility] CATH LAB;  Service: Cardiovascular;  Laterality: N/A;  ? PPM GENERATOR CHANGEOUT N/A 01/03/2022  ? Procedure: PPM GENERATOR CHANGEOUT;  Surgeon: Marinus Maw, MD;  Location: [facility] INVASIVE CV LAB;  Service: Cardiovascular;  Laterality: N/A;  ? ? ?Family History  ?Problem Relation Age of Onset  ? Cardiomyopathy Father   ? Diabetes Father   ? Heart failure Father   ? Dementia Mother   ? Cancer Son   ? Coronary artery disease Son   ? Heart attack Neg Hx   ? ? ?Social History  ? ?Socioeconomic History  ? Marital status: Widowed  ?  Spouse name: Not on file  ? Number of children: Not on file  ? Years of education: Not on file  ? Highest education level: Not on file  ?Occupational History  ? Occupation: Retired  ?  Employer: RETIRED  ?Tobacco Use  ? Smoking status: Never  ?  Smokeless tobacco: Never  ?Vaping Use  ? Vaping Use: Never used  ?Substance and Sexual Activity  ? Alcohol use: No  ? Drug use: No  ? Sexual activity: Never  ?Other Topics Concern  ? Not on file  ?Social History Narrative  ? Retired   ? Widowed   ? Tobacco Use - No.   ? Alcohol Use - no  ? Regular Exercise - no  ? Drug Use - no  ? ?Social Determinants of Health  ? ?Financial Resource Strain: Not on file  ?Food Insecurity: Not on file  ?Transportation Needs: Not on file  ?Physical Activity: Not on file  ?Stress: Not on file  ?Social Connections: Not on file  ?Intimate Partner Violence: Not on file  ? ? ?Allergies  ?Allergen Reactions  ? Aspirin Rash and Other (See Comments)  ?  GI distress  ? Tramadol Other (See Comments)  ?  GI distress  ? Naproxen    ? Shellfish-Derived Products Itching and Rash  ? ? ?Current Outpatient Medications  ?Medication Sig Dispense Refill  ? Biotin 5000 MCG CAPS Take 5,000 mcg by mouth daily. (0900)    ? carvedilol (COREG) 6.25 MG tablet Take 6.25 mg by mouth 2 (two) times daily with a meal. (0900 & 1700)    ? Cholecalciferol (VITAMIN D) 50 MCG (2000 UT) CAPS Take 2,000 Units by mouth in the morning. (0900)    ? furosemide (LASIX) 80 MG tablet Take 80 mg by mouth in the morning. (0900)    ? gabapentin (NEURONTIN) 300 MG capsule Take 300 mg by mouth 2 (two) times daily. (0900 & 2100)    ? HYDROcodone-acetaminophen (NORCO/VICODIN) 5-325 MG tablet Take 1 tablet by mouth every 6 (six) hours as needed (pain).    ? loratadine (CLARITIN) 10 MG tablet Take 10 mg by mouth in the morning. (0900)    ? magnesium oxide (MAG-OX) 400 (241.3 MG) MG tablet TAKE ONE TABLET BY MOUTH ONE TIME DAILY 30 tablet 4  ? melatonin 3 MG TABS tablet Take 3 mg by mouth at bedtime. (2100)    ? Menthol, Topical Analgesic, (BIOFREEZE COLORLESS EX) Apply 1 application topically 2 (two) times daily.    ? metolazone (ZAROXOLYN) 2.5 MG tablet Take one tablet by mouth 4 days a week.  Take 30 minutes prior to furosemide (Patient taking differently: Take one tablet by mouth 4 days a week Monday, Wednesday, Friday, Sunday.  Take 30 minutes prior to furosemide) 64 tablet 3  ? Multiple Vitamins-Minerals (CERTA-VITE PO) Take 1 tablet by mouth daily. (0900)    ? nitroGLYCERIN (NITROSTAT) 0.4 MG SL tablet Place 0.4 mg under the tongue every 5 (five) minutes as needed for chest pain (X 3 DOSES FOR CHEST PAIN).    ? polyethylene glycol powder (GLYCOLAX/MIRALAX) powder Take 17 g by mouth daily. (0900)    ? potassium chloride (KLOR-CON) 10 MEQ tablet Take 40 mEq by mouth 2 (two) times daily. (0900 & 2100)    ? sitaGLIPtin (JANUVIA) 50 MG tablet Take 50 mg by mouth in the morning. (0900)    ? Vitamins A & D (VITAMIN A & D) ointment Apply 1 application topically every 6 (six) hours as  needed for dry skin (protection).    ? ?No current facility-administered medications for this visit.  ? ? ?PHYSICAL EXAM ?Vitals:  ? 03/15/22 1455  ?BP: 130/66  ?Pulse: 89  ?Resp: 20  ?Temp: 98 ?F (36.7 ?C)  ?SpO2: 95%  ?Weight: 218 lb (98.9 kg)  ?Height:  4\' 7"  (1.397 m)  ? ? ?Constitutional: Elderly woman in wheelchair.  No acute distress ?Cardiac: regular rate and rhythm.  ?Respiratory:  unlabored. ?Abdominal:  soft, non-tender, non-distended.  ?Peripheral vascular: Palpable pedal pulses.  Small ulcer about the right great toe nailbed. ? ?PERTINENT LABORATORY AND RADIOLOGIC DATA ? ?Most recent CBC ? ?  Latest Ref Rng & Units 12/10/2021  ? 12:41 PM 12/01/2020  ? 12:41 PM 11/30/2020  ?  3:37 PM  ?CBC  ?WBC 3.4 - 10.8 x10E3/uL 6.7   8.6   8.4    ?Hemoglobin 11.1 - 15.9 g/dL 01/28/2021   16.1   09.6    ?Hematocrit 34.0 - 46.6 % 36.9   43.4   40.5    ?Platelets 150 - 450 x10E3/uL 290   291   296    ?  ? ?Most recent CMP ? ?  Latest Ref Rng & Units 12/10/2021  ? 12:41 PM 12/14/2020  ?  2:08 PM 12/01/2020  ? 12:41 PM  ?CMP  ?Glucose 70 - 99 mg/dL 01/29/2021   409   87    ?BUN 8 - 27 mg/dL 34   41   31    ?Creatinine 0.57 - 1.00 mg/dL 811   9.14   7.82    ?Sodium 134 - 144 mmol/L 140   139   139    ?Potassium 3.5 - 5.2 mmol/L 4.7   4.3   3.7    ?Chloride 96 - 106 mmol/L 98   95   98    ?CO2 20 - 29 mmol/L 28   24   26     ?Calcium 8.7 - 10.3 mg/dL 9.56     21.3    ?Total Protein 6.0 - 8.5 g/dL 7.5    8.7    ?Total Bilirubin 0.0 - 1.2 mg/dL 08.6    0.9    ?Alkaline Phos 44 - 121 IU/L 106    81    ?AST 0 - 40 IU/L 31    26    ?ALT 0 - 32 IU/L 16    10    ? ? ?Renal function ?CrCl cannot be calculated (Patient's most recent lab result is older than the maximum 21 days allowed.). ? ?Hgb A1c MFr Bld (%)  ?Date Value  ?12/31/2015 6.4 (H)  ? ? ?Vascular Imaging: ? ?+-------+-----------+-----------+------------+------------+  ?ABI/TBIToday's ABIToday's TBIPrevious ABIPrevious TBI   ?+-------+-----------+-----------+------------+------------+  ?Right  0.74       0.38                                 ?+-------+-----------+-----------+------------+------------+  ?Left   0.49       bandage                              ?+-------+-----------+---------

## 2022-03-15 ENCOUNTER — Encounter: Payer: Self-pay | Admitting: Vascular Surgery

## 2022-03-15 ENCOUNTER — Ambulatory Visit (INDEPENDENT_AMBULATORY_CARE_PROVIDER_SITE_OTHER): Payer: Medicare Other | Admitting: Vascular Surgery

## 2022-03-15 VITALS — BP 130/66 | HR 89 | Temp 98.0°F | Resp 20 | Ht <= 58 in | Wt 218.0 lb

## 2022-03-15 DIAGNOSIS — I70245 Atherosclerosis of native arteries of left leg with ulceration of other part of foot: Secondary | ICD-10-CM

## 2022-03-16 ENCOUNTER — Encounter (HOSPITAL_BASED_OUTPATIENT_CLINIC_OR_DEPARTMENT_OTHER): Payer: Medicare Other | Admitting: General Surgery

## 2022-03-16 DIAGNOSIS — E11621 Type 2 diabetes mellitus with foot ulcer: Secondary | ICD-10-CM | POA: Diagnosis not present

## 2022-03-21 NOTE — Progress Notes (Signed)
PAISLI, SILFIES ([id number]) ?Visit Report for 03/16/2022 ?Chief Complaint Document Details ?Patient Name: Date of Service: ?[name], [name] 03/16/2022 3:30 PM ?Medical Record Number: [id number] ?Patient Account Number: [id number] ?Date of Birth/Sex: Treating RN: ?Jun 30, 1931 (88 y.o. F) Boehlein, Linda ?Primary Care Provider: SYSTEM, PRO V IDER Other Clinician: ?Referring Provider: ?Treating Provider/Extender: Duanne Guess ?Weeks in Treatment: 6 ?Information Obtained from: Patient ?Chief Complaint ?Patient presents to the wound care center with open non-healing surgical wound(s) ?Electronic Signature(s) ?Signed: 03/16/2022 4:29:42 PM By: Duanne Guess MD FACS ?Entered By: Duanne Guess on 03/16/2022 16:29:41 ?-------------------------------------------------------------------------------- ?Debridement Details ?Patient Name: Date of Service: ?[name], [name] 03/16/2022 3:30 PM ?Medical Record Number: [id number] ?Patient Account Number: [id number] ?Date of Birth/Sex: Treating RN: ?07/17/1931 (88 y.o. Fredderick Phenix ?Primary Care Provider: SYSTEM, PRO V IDER Other Clinician: ?Referring Provider: ?Treating Provider/Extender: Duanne Guess ?Weeks in Treatment: 6 ?Debridement Performed for Assessment: Wound #1 Left T Great ?oe ?Performed By: Physician Duanne Guess, MD ?Debridement Type: Debridement ?Severity of Tissue Pre Debridement: Fat layer exposed ?Level of Consciousness (Pre-procedure): Awake and Alert ?Pre-procedure Verification/Time Out Yes - 15:49 ?Taken: ?Start Time: 15:49 ?Pain Control: Lidocaine 4% T opical Solution ?T Area Debrided (L x W): ?otal 0.4 (cm) x 0.4 (cm) = 0.16 (cm?) ?Tissue and other material debrided: Viable, Non-Viable, Slough, [place] ?Level: Non-Viable Tissue ?Debridement Description: Selective/Open Wound ?Instrument: Curette ?Bleeding: Minimum ?Hemostasis Achieved: Pressure ?Procedural Pain: 3 ?Post Procedural Pain: 0 ?Response to Treatment: Procedure was tolerated  well ?Level of Consciousness (Post- Awake and Alert ?procedure): ?Post Debridement Measurements of Total Wound ?Length: (cm) 0.4 ?Width: (cm) 0.4 ?Depth: (cm) 0.2 ?Volume: (cm?) 0.025 ?Character of Wound/Ulcer Post Debridement: Improved ?Severity of Tissue Post Debridement: Fat layer exposed ?Post Procedure Diagnosis ?Same as Pre-procedure ?Electronic Signature(s) ?Signed: 03/16/2022 5:05:56 PM By: Duanne Guess MD FACS ?Signed: 03/21/2022 5:16:31 PM By: Samuella Bruin ?Entered By: Samuella Bruin on 03/16/2022 15:52:58 ?-------------------------------------------------------------------------------- ?HPI Details ?Patient Name: Date of Service: ?[name], [name] 03/16/2022 3:30 PM ?Medical Record Number: [id number] ?Patient Account Number: [id number] ?Date of Birth/Sex: Treating RN: ?1931-05-21 (88 y.o. F) Boehlein, Linda ?Primary Care Provider: SYSTEM, PRO V IDER Other Clinician: ?Referring Provider: ?Treating Provider/Extender: Duanne Guess ?Weeks in Treatment: 6 ?History of Present Illness ?HPI Description: ADMISSION ?02/01/2022 ?This is an 88 year old woman who is a resident of [place]. She has a past medical history notable for type 2 diabetes mellitus, congestive heart failure, ?pulmonary hypertension, and complete heart block. She is accompanied today by her son. Approximately 4 months ago, she had an ingrown toe nail removed. ?Apparently this went a bit deep. Since that time, it has not healed. The patient is nonambulatory. Apparently they have been applying Santyl to the site at her ?facility. She was on antibiotics but I am not certain which specific drugs she took. She completed her last course a few weeks ago. We were unable to obtain ?ABIs in clinic secondary to the patient's body habitus. She has not had any formal vascular studies nor any x-ray of the area. ?02/09/2022: The wound is roughly the same size today, but the amount of slough in the base has decreased significantly. She  apparently had an x-ray done at ?her facility, but we do not have the report; per verbal report from facility staff, "there was nothing there." We had requested formal ABI/TBI studies, but these ?were not done. She has been in [place] under [employer]	[place]. ?02/16/2022: The wound measured about the same today, but to my eye, it appears rather smaller.  The slough in the base is minimal. Her vascular studies are ?scheduled for Friday. We have been using Santyl under [facility] [employer]. No concern for infection. ?02/23/2022: Once again, the wound has been measured about the same size, but I think it looks a bit smaller. There is slough at the base. She had her ABIs done ?last week and these show severe bilateral peripheral arterial disease. We received the report of the foot x-ray that was ordered. There is no concern for ?osteomyelitis at this time. ?03/02/2022: The wound is smaller today with a small amount of slough at the base. We placed a referral to vein and vascular surgery last week in response to ?her severe peripheral arterial disease, but she does not yet have an appointment on the books. ?03/16/2022: She saw Dr. Lenell Antu in the vascular surgery department yesterday. He did not think she was a good candidate for any revascularization options ?and suggested that she continue local wound care for another month. If she fails to heal in that time, they may consider an angiogram with potential ?intervention. The wound today is roughly the same size but there is less exposed bone at the base and the surface is clean without any substantial slough. ?Electronic Signature(s) ?Signed: 03/16/2022 4:30:54 PM By: Duanne Guess MD FACS ?Entered By: Duanne Guess on 03/16/2022 16:30:54 ?-------------------------------------------------------------------------------- ?Physical Exam Details ?Patient Name: Date of Service: ?[name], [name] 03/16/2022 3:30 PM ?Medical Record Number: [id number] ?Patient Account Number: [id number] ?Date  of Birth/Sex: Treating RN: ?03/31/1931 (88 y.o. F) Boehlein, Linda ?Primary Care Provider: SYSTEM, PRO V IDER Other Clinician: ?Referring Provider: ?Treating Provider/Extender: Duanne Guess ?Weeks in Treatment: 6 ?Constitutional ?. . . . No acute distress. ?Respiratory ?Normal work of breathing on room air. ?Notes ?03/16/2022: The circular wound on her left great toe is perhaps slightly smaller but roughly the same size. It is shallower with less accumulated slough. There is ?less bone exposed at the wound base. ?Electronic Signature(s) ?Signed: 03/16/2022 4:32:39 PM By: Duanne Guess MD FACS ?Entered By: Duanne Guess on 03/16/2022 16:32:38 ?-------------------------------------------------------------------------------- ?Physician Orders Details ?Patient Name: Date of Service: ?[name], [name] 03/16/2022 3:30 PM ?Medical Record Number: [id number] ?Patient Account Number: [id number] ?Date of Birth/Sex: Treating RN: ?[birth year] (88 y.o. Fredderick Phenix ?Primary Care Provider: SYSTEM, PRO V IDER Other Clinician: ?Referring Provider: ?Treating Provider/Extender: Duanne Guess ?Weeks in Treatment: 6 ?Verbal / Phone Orders: No ?Diagnosis Coding ?ICD-10 Coding ?Code Description ?E11.621 Type 2 diabetes mellitus with foot ulcer ?N39.767 Non-pressure chronic ulcer of other part of left foot with fat layer exposed ?I50.32 Chronic diastolic (congestive) heart failure ?I27.20 Pulmonary hypertension, unspecified ?Follow-up Appointments ?ppointment in 1 week. - Dr. Lady Gary - Room 1 ?Return A ?Bathing/ Shower/ Hygiene ?May shower with protection but do not get wound dressing(s) wet. ?Off-Loading ?Other: - No direct pressure on toes ?Wound Treatment ?Wound #1 - T Great ?oe Wound Laterality: Left ?Cleanser: Wound Cleanser 1 x Per Day ?Discharge Instructions: Cleanse the wound with wound cleanser prior to applying a clean dressing using gauze sponges, not tissue or cotton balls. ?Prim Dressing: Promogran Prisma  Matrix, 4.34 (sq in) (silver collagen) 1 x Per Day ?ary ?Discharge Instructions: Moisten collagen with saline or hydrogel ?Secondary Dressing: Woven Gauze Sponges 2x2 in 1 x Per Day ?Discharge Instructions: Apply ov

## 2022-03-21 NOTE — Progress Notes (Signed)
AMARRAH, FAIN ([id number]) ?Visit Report for 03/16/2022 ?Arrival Information Details ?Patient Name: Date of Service: ?[name], [name] 03/16/2022 3:30 PM ?Medical Record Number: [id number] ?Patient Account Number: [id number] ?Date of Birth/Sex: Treating RN: ?1931-03-10 (88 y.o. Harlow Ohms ?Primary Care Jarrette Dehner: SYSTEM, PRO V IDER Other Clinician: ?Referring Suprena Travaglini: ?Treating Keyshon Stein/Extender: Fredirick Maudlin ?Weeks in Treatment: 6 ?Visit Information History Since Last Visit ?Added or deleted any medications: No ?Patient Arrived: Wheel Chair ?Any new allergies or adverse reactions: No ?Arrival Time: 15:23 ?Had a fall or experienced change in No ?Accompanied By: son ?activities of daily living that may affect ?Transfer Assistance: Manual ?risk of falls: ?Patient Identification Verified: Yes ?Signs or symptoms of abuse/neglect since last visito No ?Secondary Verification Process Completed: Yes ?Hospitalized since last visit: No ?Patient Requires Transmission-Based Precautions: No ?Implantable device outside of the clinic excluding No ?Patient Has Alerts: Yes ?cellular tissue based products placed in the center ?Patient Alerts: ABI not obtainable since last visit: ?Has Dressing in Place as Prescribed: Yes ?Pain Present Now: Yes ?Electronic Signature(s) ?Signed: 03/21/2022 5:16:31 PM By: Adline Peals ?Entered By: Adline Peals on 03/16/2022 15:25:43 ?-------------------------------------------------------------------------------- ?Encounter Discharge Information Details ?Patient Name: Date of Service: ?[name], [name] 03/16/2022 3:30 PM ?Medical Record Number: [id number] ?Patient Account Number: [id number] ?Date of Birth/Sex: Treating RN: ?[birth year] (88 y.o. Harlow Ohms ?Primary Care Tesean Stump: SYSTEM, PRO V IDER Other Clinician: ?Referring Aarna Mihalko: ?Treating Yassine Brunsman/Extender: Fredirick Maudlin ?Weeks in Treatment: 6 ?Encounter Discharge Information Items Post Procedure  Vitals ?Discharge Condition: Stable ?Temperature (F): 98.3 ?Ambulatory Status: Wheelchair ?Pulse (bpm): 93 ?Discharge Destination: Home ?Respiratory Rate (breaths/min): 18 ?Transportation: Private Auto ?Blood Pressure (mmHg): 106/57 ?Accompanied By: son ?Schedule Follow-up Appointment: Yes ?Clinical Summary of Care: Patient Declined ?Electronic Signature(s) ?Signed: 03/21/2022 5:16:31 PM By: Adline Peals ?Entered By: Adline Peals on 03/16/2022 16:13:44 ?-------------------------------------------------------------------------------- ?Lower Extremity Assessment Details ?Patient Name: ?Date of Service: ?[name], [name] 03/16/2022 3:30 PM ?Medical Record Number: [id number] ?Patient Account Number: [id number] ?Date of Birth/Sex: ?Treating RN: ?01/06/1931 (88 y.o. Harlow Ohms ?Primary Care Elder Davidian: SYSTEM, PRO V IDER ?Other Clinician: ?Referring Caylor Cerino: ?Treating Neva Ramaswamy/Extender: Fredirick Maudlin ?Weeks in Treatment: 6 ?Edema Assessment ?Assessed: [Left: No] [Right: No] ?E[Left: dema] [Right: :] ?Calf ?Left: Right: ?Point of Measurement: 26 cm From Medial Instep 39.5 cm ?Ankle ?Left: Right: ?Point of Measurement: 10 cm From Medial Instep 25 cm ?Electronic Signature(s) ?Signed: 03/21/2022 5:16:31 PM By: Adline Peals ?Entered By: Adline Peals on 03/16/2022 15:26:45 ?-------------------------------------------------------------------------------- ?Multi Wound Chart Details ?Patient Name: ?Date of Service: ?[name], [name] 03/16/2022 3:30 PM ?Medical Record Number: [id number] ?Patient Account Number: [id number] ?Date of Birth/Sex: ?Treating RN: ?[birth year] (88 y.o. F) Boehlein, Linda ?Primary Care Silverio Hagan: SYSTEM, PRO V IDER ?Other Clinician: ?Referring Younes Degeorge: ?Treating Alonia Dibuono/Extender: Fredirick Maudlin ?Weeks in Treatment: 6 ?Vital Signs ?Height(in): ?Pulse(bpm): 93 ?Weight(lbs): ?Blood Pressure(mmHg): 106/57 ?Body Mass Index(BMI): ?Temperature(??F): 98.3 ?Respiratory  Rate(breaths/min): 18 ?Photos: [N/A:N/A] ?Left T Great ?oe N/A N/A ?Wound Location: ?Gradually Appeared N/A N/A ?Wounding Event: ?Diabetic Wound/Ulcer of the Lower N/A N/A ?Primary Etiology: ?Extremity ?Arrhythmia, Congestive Heart Failure, N/A N/A ?Comorbid History: ?Hypertension, Type II Diabetes, Gout, ?Osteoarthritis ?09/29/2021 N/A N/A ?Date Acquired: ?6 N/A N/A ?Weeks of Treatment: ?Open N/A N/A ?Wound Status: ?No N/A N/A ?Wound Recurrence: ?0.4x0.4x0.2 N/A N/A ?Measurements L x W x D (cm) ?0.126 N/A N/A ?A (cm?) : ?rea ?0.025 N/A N/A ?Volume (cm?) : ?55.50% N/A N/A ?% Reduction in A rea: ?56.10% N/A N/A ?% Reduction in Volume: ?Grade 2 N/A N/A ?Classification: ?Medium N/A N/A ?  Exudate A mount: ?Serous N/A N/A ?Exudate Type: ?amber N/A N/A ?Exudate Color: ?Flat and Intact N/A N/A ?Wound Margin: ?Medium (34-66%) N/A N/A ?Granulation A mount: ?Pink, Pale N/A N/A ?Granulation Quality: ?Medium (34-66%) N/A N/A ?Necrotic A mount: ?Fat Layer (Subcutaneous Tissue): Yes N/A N/A ?Exposed Structures: ?Bone: Yes ?Fascia: No ?Tendon: No ?Muscle: No ?Joint: No ?Small (1-33%) N/A N/A ?Epithelialization: ?Debridement - Selective/Open Wound N/A N/A ?Debridement: ?Pre-procedure Verification/Time Out 15:49 N/A N/A ?Taken: ?Lidocaine 4% Topical Solution N/A N/A ?Pain Control: ?Slough N/A N/A ?Tissue Debrided: ?Non-Viable Tissue N/A N/A ?Level: ?0.16 N/A N/A ?Debridement A (sq cm): ?rea ?Curette N/A N/A ?Instrument: ?Minimum N/A N/A ?Bleeding: ?Pressure N/A N/A ?Hemostasis A chieved: ?3 N/A N/A ?Procedural Pain: ?0 N/A N/A ?Post Procedural Pain: ?Procedure was tolerated well N/A N/A ?Debridement Treatment Response: ?0.4x0.4x0.2 N/A N/A ?Post Debridement Measurements L x ?W x D (cm) ?0.025 N/A N/A ?Post Debridement Volume: (cm?) ?Debridement N/A N/A ?Procedures Performed: ?Treatment Notes ?Wound #1 (Toe Great) Wound Laterality: Left ?Cleanser ?Wound Cleanser ?Discharge Instruction: Cleanse the wound with wound cleanser prior to  applying a clean dressing using gauze sponges, not tissue or cotton balls. ?Peri-Wound Care ?Topical ?Primary Dressing ?Promogran Prisma Matrix, 4.34 (sq in) (silver collagen) ?Discharge Instruction: Moisten collagen with saline or hydrogel ?Secondary Dressing ?Woven Gauze Sponges 2x2 in ?Discharge Instruction: Apply over primary dressing as directed. ?Secured With ?Conforming Stretch Gauze Bandage, Sterile 2x75 (in/in) ?Discharge Instruction: Secure with stretch gauze as directed. ?Paper Tape, 1x10 (in/yd) ?Discharge Instruction: Secure dressing with tape as directed. ?Compression Wrap ?Compression Stockings ?Add-Ons ?Electronic Signature(s) ?Signed: 03/16/2022 4:29:33 PM By: Fredirick Maudlin MD FACS ?Signed: 03/16/2022 4:48:49 PM By: Baruch Gouty RN, BSN ?Entered By: Fredirick Maudlin on 03/16/2022 16:29:32 ?-------------------------------------------------------------------------------- ?Multi-Disciplinary Care Plan Details ?Patient Name: ?Date of Service: ?[name], [name] 03/16/2022 3:30 PM ?Medical Record Number: [id number] ?Patient Account Number: [id number] ?Date of Birth/Sex: ?Treating RN: ?10-Mar-1931 (88 y.o. Harlow Ohms ?Primary Care Pearlie Lafosse: SYSTEM, PRO V IDER ?Other Clinician: ?Referring Arrin Ishler: ?Treating Ishmael Berkovich/Extender: Fredirick Maudlin ?Weeks in Treatment: 6 ?Multidisciplinary Care Plan reviewed with physician ?Active Inactive ?Nutrition ?Nursing Diagnoses: ?Impaired glucose control: actual or potential ?Potential for alteratiion in Nutrition/Potential for imbalanced nutrition ?Goals: ?Patient/caregiver will maintain therapeutic glucose control ?Date Initiated: 02/01/2022 ?Target Resolution Date: 03/31/2022 ?Goal Status: Active ?Interventions: ?Assess HgA1c results as ordered upon admission and as needed ?Assess patient nutrition upon admission and as needed per policy ?Provide education on elevated blood sugars and impact on wound healing ?Provide education on nutrition ?Treatment  Activities: ?Education provided on Nutrition : 02/01/2022 ?Notes: ?Wound/Skin Impairment ?Nursing Diagnoses: ?Impaired tissue integrity ?Knowledge deficit related to ulceration/compromised skin integrity ?Goals: ?Patient/caregiver will ver

## 2022-03-24 ENCOUNTER — Encounter (HOSPITAL_BASED_OUTPATIENT_CLINIC_OR_DEPARTMENT_OTHER): Payer: Medicare Other | Attending: General Surgery | Admitting: General Surgery

## 2022-03-24 DIAGNOSIS — L97522 Non-pressure chronic ulcer of other part of left foot with fat layer exposed: Secondary | ICD-10-CM | POA: Insufficient documentation

## 2022-03-24 DIAGNOSIS — E11621 Type 2 diabetes mellitus with foot ulcer: Secondary | ICD-10-CM | POA: Insufficient documentation

## 2022-03-24 DIAGNOSIS — I110 Hypertensive heart disease with heart failure: Secondary | ICD-10-CM | POA: Insufficient documentation

## 2022-03-24 DIAGNOSIS — I272 Pulmonary hypertension, unspecified: Secondary | ICD-10-CM | POA: Insufficient documentation

## 2022-03-24 DIAGNOSIS — I5032 Chronic diastolic (congestive) heart failure: Secondary | ICD-10-CM | POA: Diagnosis not present

## 2022-03-25 NOTE — Progress Notes (Signed)
AYDRIANNA, STROMAN ([id number]) ?Visit Report for 03/24/2022 ?Arrival Information Details ?Patient Name: Date of Service: ?[name], Aowyn S. 03/24/2022 2:00 PM ?Medical Record Number: [id number] ?Patient Account Number: [id number] ?Date of Birth/Sex: Treating RN: ?January 07, 1931 (87 y.o. F) Boehlein, Linda ?Primary Care Abdulah Iqbal: SYSTEM, PRO V IDER Other Clinician: ?Referring Breeley Bischof: ?Treating Raevyn Sokol/Extender: Fredirick Maudlin ?Weeks in Treatment: 7 ?Visit Information History Since Last Visit ?Added or deleted any medications: No ?Patient Arrived: Wheel Chair ?Any new allergies or adverse reactions: No ?Arrival Time: 14:12 ?Had a fall or experienced change in No ?Accompanied By: self ?activities of daily living that may affect ?Transfer Assistance: None ?risk of falls: ?Patient Identification Verified: Yes ?Signs or symptoms of abuse/neglect since last visito No ?Secondary Verification Process Completed: Yes ?Hospitalized since last visit: No ?Patient Requires Transmission-Based Precautions: No ?Implantable device outside of the clinic excluding No ?Patient Has Alerts: Yes ?cellular tissue based products placed in the center ?Patient Alerts: ABI not obtainable since last visit: ?Has Dressing in Place as Prescribed: Yes ?Pain Present Now: No ?Electronic Signature(s) ?Signed: 03/25/2022 10:39:22 AM By: Sandre Kitty ?Entered By: Sandre Kitty on 03/24/2022 14:12:41 ?-------------------------------------------------------------------------------- ?Encounter Discharge Information Details ?Patient Name: Date of Service: ?[name], Miria S. 03/24/2022 2:00 PM ?Medical Record Number: [id number] ?Patient Account Number: [id number] ?Date of Birth/Sex: Treating RN: ?December 05, 1930 (88 y.o. F) Boehlein, Linda ?Primary Care Richard Holz: SYSTEM, PRO V IDER Other Clinician: ?Referring Tarvares Lant: ?Treating Jackie Littlejohn/Extender: Fredirick Maudlin ?Weeks in Treatment: 7 ?Encounter Discharge Information Items Post Procedure Vitals ?Discharge Condition:  Stable ?Temperature (F): 98.4 ?Ambulatory Status: Wheelchair ?Pulse (bpm): 87 ?Discharge Destination: Home ?Respiratory Rate (breaths/min): 18 ?Transportation: Private Auto ?Blood Pressure (mmHg): 139/70 ?Accompanied By: son ?Schedule Follow-up Appointment: Yes ?Clinical Summary of Care: Patient Declined ?Electronic Signature(s) ?Signed: 03/24/2022 6:07:39 PM By: Baruch Gouty RN, BSN ?Entered By: Baruch Gouty on 03/24/2022 15:00:42 ?-------------------------------------------------------------------------------- ?Lower Extremity Assessment Details ?Patient Name: ?Date of Service: ?[name], Franchon S. 03/24/2022 2:00 PM ?Medical Record Number: [id number] ?Patient Account Number: [id number] ?Date of Birth/Sex: ?Treating RN: ?[birth year] (88 y.o. [name], Shatara ?Primary Care Mylo Choi: SYSTEM, PRO V IDER ?Other Clinician: ?Referring Kaynen Minner: ?Treating Tian Mcmurtrey/Extender: Fredirick Maudlin ?Weeks in Treatment: 7 ?Edema Assessment ?Assessed: [Left: No] [Right: No] ?E[Left: dema] [Right: :] ?Calf ?Left: Right: ?Point of Measurement: 26 cm From Medial Instep 39.5 cm ?Ankle ?Left: Right: ?Point of Measurement: 10 cm From Medial Instep 25 cm ?Vascular Assessment ?Pulses: ?Dorsalis Pedis ?Palpable: [Left:Yes] ?Electronic Signature(s) ?Signed: 03/24/2022 6:01:57 PM By: Levan Hurst RN, BSN ?Entered By: Levan Hurst on 03/24/2022 14:31:24 ?-------------------------------------------------------------------------------- ?Multi Wound Chart Details ?Patient Name: ?Date of Service: ?[name], Citlalli S. 03/24/2022 2:00 PM ?Medical Record Number: [id number] ?Patient Account Number: [id number] ?Date of Birth/Sex: ?Treating RN: ?September 08, 1931 (88 y.o. F) Boehlein, Linda ?Primary Care Ottie Tillery: SYSTEM, PRO V IDER ?Other Clinician: ?Referring Marquette Piontek: ?Treating Clarece Drzewiecki/Extender: Fredirick Maudlin ?Weeks in Treatment: 7 ?Vital Signs ?Height(in): ?Pulse(bpm): 87 ?Weight(lbs): ?Blood Pressure(mmHg): 139/70 ?Body Mass  Index(BMI): ?Temperature(??F): 98.4 ?Respiratory Rate(breaths/min): 18 ?Photos: [1:No Photos Left T Great oe] [N/A:N/A N/A] ?Wound Location: [1:Gradually Appeared] [N/A:N/A] ?Wounding Event: [1:Diabetic Wound/Ulcer of the Lower] [N/A:N/A] ?Primary Etiology: [1:Extremity Arrhythmia, Congestive Heart Failure,] [N/A:N/A] ?Comorbid History: [1:Hypertension, Type II Diabetes, Gout, Osteoarthritis 09/29/2021] [N/A:N/A] ?Date Acquired: [1:7] [N/A:N/A] ?Weeks of Treatment: [1:Open] [N/A:N/A] ?Wound Status: [1:No] [N/A:N/A] ?Wound Recurrence: [1:0.4x0.4x0.2] [N/A:N/A] ?Measurements L x W x D (cm) [1:0.126] [N/A:N/A] ?A (cm?) : ?rea [1:0.025] [N/A:N/A] ?Volume (cm?) : [1:55.50%] [N/A:N/A] ?% Reduction in A rea: [1:56.10%] [N/A:N/A] ?% Reduction in Volume: [1:Grade 2] [N/A:N/A] ?Classification: [1:Medium] [N/A:N/A] ?Exudate A  mount: [1:Serous] [N/A:N/A] ?Exudate Type: [1:amber] [N/A:N/A] ?Exudate Color: [1:Flat and Intact] [N/A:N/A] ?Wound Margin: [1:Medium (34-66%)] [N/A:N/A] ?Granulation A mount: [1:Pink, Pale] [N/A:N/A] ?Granulation Quality: [1:Medium (34-66%)] [N/A:N/A] ?Necrotic A mount: ?[1:Fat Layer (Subcutaneous Tissue): Yes N/A] ?Exposed Structures: ?[1:Bone: Yes Fascia: No Tendon: No Muscle: No Joint: No Small (1-33%)] [N/A:N/A] ?Epithelialization: [1:Debridement - Excisional] [N/A:N/A] ?Debridement: ?Pre-procedure Verification/Time Out 14:40 [N/A:N/A] ?Taken: [1:Lidocaine 4% Topical Solution] [N/A:N/A] ?Pain Control: [1:Callus, Subcutaneous, Slough] [N/A:N/A] ?Tissue Debrided: [1:Skin/Subcutaneous Tissue] [N/A:N/A] ?Level: [1:0.16] [N/A:N/A] ?Debridement A (sq cm): [1:rea Curette] [N/A:N/A] ?Instrument: [1:Minimum] [N/A:N/A] ?Bleeding: [1:Pressure] [N/A:N/A] ?Hemostasis A chieved: [1:2] [N/A:N/A] ?Procedural Pain: [1:1] [N/A:N/A] ?Post Procedural Pain: [1:Procedure was tolerated well] [N/A:N/A] ?Debridement Treatment Response: [1:0.4x0.4x0.1] [N/A:N/A] ?Post Debridement Measurements L x ?W x D (cm) [1:0.013]  [N/A:N/A] ?Post Debridement Volume: (cm?) [1:Debridement] [N/A:N/A] ?Treatment Notes ?Wound #1 (Toe Great) Wound Laterality: Left ?Cleanser ?Wound Cleanser ?Discharge Instruction: Cleanse the wound with wound cleanser prior to applying a clean dressing using gauze sponges, not tissue or cotton balls. ?Peri-Wound Care ?Topical ?Primary Dressing ?Promogran Prisma Matrix, 4.34 (sq in) (silver collagen) ?Discharge Instruction: Moisten collagen with saline or hydrogel ?Secondary Dressing ?Woven Gauze Sponges 2x2 in ?Discharge Instruction: Apply over primary dressing as directed. ?Secured With ?Conforming Stretch Gauze Bandage, Sterile 2x75 (in/in) ?Discharge Instruction: Secure with stretch gauze as directed. ?Paper Tape, 1x10 (in/yd) ?Discharge Instruction: Secure dressing with tape as directed. ?Compression Wrap ?Compression Stockings ?Add-Ons ?Electronic Signature(s) ?Signed: 03/24/2022 3:01:03 PM By: Fredirick Maudlin MD FACS ?Signed: 03/24/2022 6:07:39 PM By: Baruch Gouty RN, BSN ?Entered By: Fredirick Maudlin on 03/24/2022 15:01:03 ?-------------------------------------------------------------------------------- ?Multi-Disciplinary Care Plan Details ?Patient Name: ?Date of Service: ?[name], Latiffany S. 03/24/2022 2:00 PM ?Medical Record Number: [id number] ?Patient Account Number: [id number] ?Date of Birth/Sex: ?Treating RN: ?1931/10/10 (88 y.o. [name], Shatara ?Primary Care Patty Leitzke: SYSTEM, PRO V IDER ?Other Clinician: ?Referring Qunisha Bryk: ?Treating Dustyn Dansereau/Extender: Fredirick Maudlin ?Weeks in Treatment: 7 ?Multidisciplinary Care Plan reviewed with physician ?Active Inactive ?Nutrition ?Nursing Diagnoses: ?Impaired glucose control: actual or potential ?Potential for alteratiion in Nutrition/Potential for imbalanced nutrition ?Goals: ?Patient/caregiver will maintain therapeutic glucose control ?Date Initiated: 02/01/2022 ?Target Resolution Date: 03/31/2022 ?Goal Status: Active ?Interventions: ?Assess HgA1c results as  ordered upon admission and as needed ?Assess patient nutrition upon admission and as needed per policy ?Provide education on elevated blood sugars and impact on wound healing ?Provide education on nutrition ?Treatment Activities: ?Education

## 2022-03-25 NOTE — Progress Notes (Signed)
SHIARA, MCGOUGH ([id number]) ?Visit Report for 03/24/2022 ?Chief Complaint Document Details ?Patient Name: Date of Service: ?[name], [name] 03/24/2022 2:00 PM ?Medical Record Number: [id number] ?Patient Account Number: [id number] ?Date of Birth/Sex: Treating RN: ?12-28-1930 (88 y.o. F) Boehlein, Linda ?Primary Care Provider: SYSTEM, PRO V IDER Other Clinician: ?Referring Provider: ?Treating Provider/Extender: Duanne Guess ?Weeks in Treatment: 7 ?Information Obtained from: Patient ?Chief Complaint ?Patient presents to the wound care center with open non-healing surgical wound(s) ?Electronic Signature(s) ?Signed: 03/24/2022 3:03:03 PM By: Duanne Guess MD FACS ?Entered By: Duanne Guess on 03/24/2022 15:03:03 ?-------------------------------------------------------------------------------- ?Debridement Details ?Patient Name: Date of Service: ?[name], [name] 03/24/2022 2:00 PM ?Medical Record Number: [id number] ?Patient Account Number: [id number] ?Date of Birth/Sex: Treating RN: ?Jan 09, 1931 (88 y.o. F) Boehlein, Linda ?Primary Care Provider: SYSTEM, PRO V IDER Other Clinician: ?Referring Provider: ?Treating Provider/Extender: Duanne Guess ?Weeks in Treatment: 7 ?Debridement Performed for Assessment: Wound #1 Left T Great ?oe ?Performed By: Physician Duanne Guess, MD ?Debridement Type: Debridement ?Severity of Tissue Pre Debridement: Fat layer exposed ?Level of Consciousness (Pre-procedure): Awake and Alert ?Pre-procedure Verification/Time Out Yes - 14:40 ?Taken: ?Start Time: 14:41 ?Pain Control: Lidocaine 4% T opical Solution ?T Area Debrided (L x W): ?otal 0.4 (cm) x 0.4 (cm) = 0.16 (cm?) ?Tissue and other material debrided: Non-Viable, Callus, Slough, Subcutaneous, Slough ?Level: Skin/Subcutaneous Tissue ?Debridement Description: Excisional ?Instrument: Curette ?Bleeding: Minimum ?Hemostasis Achieved: Pressure ?Procedural Pain: 2 ?Post Procedural Pain: 1 ?Response to Treatment: Procedure was tolerated  well ?Level of Consciousness (Post- Awake and Alert ?procedure): ?Post Debridement Measurements of Total Wound ?Length: (cm) 0.4 ?Width: (cm) 0.4 ?Depth: (cm) 0.1 ?Volume: (cm?) 0.013 ?Character of Wound/Ulcer Post Debridement: Improved ?Severity of Tissue Post Debridement: Fat layer exposed ?Post Procedure Diagnosis ?Same as Pre-procedure ?Electronic Signature(s) ?Signed: 03/24/2022 6:07:39 PM By: Zenaida Deed RN, BSN ?Signed: 03/25/2022 7:39:53 AM By: Duanne Guess MD FACS ?Entered By: Zenaida Deed on 03/24/2022 14:45:21 ?-------------------------------------------------------------------------------- ?HPI Details ?Patient Name: Date of Service: ?[name], [name] 03/24/2022 2:00 PM ?Medical Record Number: [id number] ?Patient Account Number: [id number] ?Date of Birth/Sex: Treating RN: ?04-21-1931 (88 y.o. F) Boehlein, Linda ?Primary Care Provider: SYSTEM, PRO V IDER Other Clinician: ?Referring Provider: ?Treating Provider/Extender: Duanne Guess ?Weeks in Treatment: 7 ?History of Present Illness ?HPI Description: ADMISSION ?02/01/2022 ?This is an 88 year old woman who is a resident of [place]. She has a past medical history notable for type 2 diabetes mellitus, congestive heart failure, ?pulmonary hypertension, and complete heart block. She is accompanied today by her son. Approximately 4 months ago, she had an ingrown toe nail removed. ?Apparently this went a bit deep. Since that time, it has not healed. The patient is nonambulatory. Apparently they have been applying Santyl to the site at her ?facility. She was on antibiotics but I am not certain which specific drugs she took. She completed her last course a few weeks ago. We were unable to obtain ?ABIs in clinic secondary to the patient's body habitus. She has not had any formal vascular studies nor any x-ray of the area. ?02/09/2022: The wound is roughly the same size today, but the amount of slough in the base has decreased significantly. She  apparently had an x-ray done at ?her facility, but we do not have the report; per verbal report from facility staff, "there was nothing there." We had requested formal ABI/TBI studies, but these ?were not done. She has been in [place] under [employer] [facility]	[place]. ?02/16/2022: The wound measured about the same today, but to my eye, it appears  rather smaller. The slough in the base is minimal. Her vascular studies are ?scheduled for Friday. We have been using Santyl under [employer]. No concern for infection. ?02/23/2022: Once again, the wound has been measured about the same size, but I think it looks a bit smaller. There is slough at the base. She had her ABIs done ?last week and these show severe bilateral peripheral arterial disease. We received the report of the foot x-ray that was ordered. There is no concern for ?osteomyelitis at this time. ?03/02/2022: The wound is smaller today with a small amount of slough at the base. We placed a referral to vein and vascular surgery last week in response to ?her severe peripheral arterial disease, but she does not yet have an appointment on the books. ?03/16/2022: She saw Dr. Lenell Antu in the vascular surgery department yesterday. He did not think she was a good candidate for any revascularization options ?and suggested that she continue local wound care for another month. If she fails to heal in that time, they may consider an angiogram with potential ?intervention. The wound today is roughly the same size but there is less exposed bone at the base and the surface is clean without any substantial slough. ?03/24/2022: The wound appears about the same. There is some periwound callus that has heaped up. We changed her to Prisma silver collagen last week. ?Electronic Signature(s) ?Signed: 03/24/2022 3:03:35 PM By: Duanne Guess MD FACS ?Entered By: Duanne Guess on 03/24/2022 15:03:34 ?-------------------------------------------------------------------------------- ?Physical Exam  Details ?Patient Name: Date of Service: ?[name], [name] 03/24/2022 2:00 PM ?Medical Record Number: [id number] ?Patient Account Number: [id number] ?Date of Birth/Sex: Treating RN: ?April 30, 1931 (88 y.o. F) Boehlein, Linda ?Primary Care Provider: SYSTEM, PRO V IDER Other Clinician: ?Referring Provider: ?Treating Provider/Extender: Duanne Guess ?Weeks in Treatment: 7 ?Constitutional ?. . . . No acute distress. ?Respiratory ?Normal work of breathing on room air. ?Notes ?03/24/2022: No significant change in the wound. It is about the same size with periwound callus accumulation. Bone remains exposed at the base. ?Electronic Signature(s) ?Signed: 03/24/2022 3:04:24 PM By: Duanne Guess MD FACS ?Entered By: Duanne Guess on 03/24/2022 15:04:24 ?-------------------------------------------------------------------------------- ?Physician Orders Details ?Patient Name: Date of Service: ?[name], [name] 03/24/2022 2:00 PM ?Medical Record Number: [id number] ?Patient Account Number: [id number] ?Date of Birth/Sex: Treating RN: ?Nov 11, 1931 (88 y.o. F) Boehlein, Linda ?Primary Care Provider: SYSTEM, PRO V IDER Other Clinician: ?Referring Provider: ?Treating Provider/Extender: Duanne Guess ?Weeks in Treatment: 7 ?Verbal / Phone Orders: No ?Diagnosis Coding ?ICD-10 Coding ?Code Description ?E11.621 Type 2 diabetes mellitus with foot ulcer ?Z30.076 Non-pressure chronic ulcer of other part of left foot with fat layer exposed ?I50.32 Chronic diastolic (congestive) heart failure ?I27.20 Pulmonary hypertension, unspecified ?Follow-up Appointments ?ppointment in 2 weeks. - Dr. Lady Gary - Room 1 ?Return A ?Wed. 5/17 @ 2:00 pm ?Bathing/ Shower/ Hygiene ?May shower with protection but do not get wound dressing(s) wet. ?Off-Loading ?Other: - No direct pressure on toes ?Wound Treatment ?Wound #1 - T Great ?oe Wound Laterality: Left ?Cleanser: Wound Cleanser Every Other Day/30 Days ?Discharge Instructions: Cleanse the wound with wound  cleanser prior to applying a clean dressing using gauze sponges, not tissue or cotton balls. ?Prim Dressing: Promogran Prisma Matrix, 4.34 (sq in) (silver collagen) Every Other Day/30 Days ?ary ?Discharge Instructions: M

## 2022-04-06 ENCOUNTER — Encounter (HOSPITAL_BASED_OUTPATIENT_CLINIC_OR_DEPARTMENT_OTHER): Payer: Medicare Other | Admitting: General Surgery

## 2022-04-06 DIAGNOSIS — E11621 Type 2 diabetes mellitus with foot ulcer: Secondary | ICD-10-CM | POA: Diagnosis not present

## 2022-04-06 NOTE — Progress Notes (Signed)
ENOLIA, CRUZAN ([id number]) ?Visit Report for 04/06/2022 ?Chief Complaint Document Details ?Patient Name: Date of Service: ?[name], [name] 04/06/2022 2:00 PM ?Medical Record Number: [id number] ?Patient Account Number: [id number] ?Date of Birth/Sex: Treating RN: ?Jul 26, 1931 (88 y.o. F) Boehlein, Linda ?Primary Care Provider: SYSTEM, PRO V IDER Other Clinician: ?Referring Provider: ?Treating Provider/Extender: Fredirick Maudlin ?Weeks in Treatment: 9 ?Information Obtained from: Patient ?Chief Complaint ?Patient presents to the wound care center with open non-healing surgical wound(s) ?Electronic Signature(s) ?Signed: 04/06/2022 2:44:25 PM By: Fredirick Maudlin MD FACS ?Entered By: Fredirick Maudlin on 04/06/2022 14:44:24 ?-------------------------------------------------------------------------------- ?Debridement Details ?Patient Name: Date of Service: ?[name], [name] 04/06/2022 2:00 PM ?Medical Record Number: [id number] ?Patient Account Number: [id number] ?Date of Birth/Sex: Treating RN: ?Jun 24, 1931 (88 y.o. F) Boehlein, Linda ?Primary Care Provider: SYSTEM, PRO V IDER Other Clinician: ?Referring Provider: ?Treating Provider/Extender: Fredirick Maudlin ?Weeks in Treatment: 9 ?Debridement Performed for Assessment: Wound #1 Left T Great ?oe ?Performed By: Physician Fredirick Maudlin, MD ?Debridement Type: Debridement ?Severity of Tissue Pre Debridement: Bone involvement without necrosis ?Level of Consciousness (Pre-procedure): Awake and Alert ?Pre-procedure Verification/Time Out Yes - 14:35 ?Taken: ?Start Time: 14:36 ?Pain Control: Lidocaine 5% topical ointment ?T Area Debrided (L x W): ?otal 0.6 (cm) x 0.5 (cm) = 0.3 (cm?) ?Tissue and other material debrided: ?Non-Viable, Slough, Skin: Epidermis, [place] ?Level: Skin/Epidermis ?Debridement Description: Selective/Open Wound ?Instrument: Curette ?Bleeding: None ?Procedural Pain: 0 ?Post Procedural Pain: 0 ?Response to Treatment: Procedure was tolerated well ?Level of  Consciousness (Post- Awake and Alert ?procedure): ?Post Debridement Measurements of Total Wound ?Length: (cm) 0.6 ?Width: (cm) 0.5 ?Depth: (cm) 0.2 ?Volume: (cm?) 0.047 ?Character of Wound/Ulcer Post Debridement: Improved ?Severity of Tissue Post Debridement: Bone involvement without necrosis ?Post Procedure Diagnosis ?Same as Pre-procedure ?Electronic Signature(s) ?Signed: 04/06/2022 2:52:08 PM By: Fredirick Maudlin MD FACS ?Signed: 04/06/2022 6:28:42 PM By: Baruch Gouty RN, BSN ?Entered By: Baruch Gouty on 04/06/2022 14:40:43 ?-------------------------------------------------------------------------------- ?HPI Details ?Patient Name: Date of Service: ?[name], [name] 04/06/2022 2:00 PM ?Medical Record Number: [id number] ?Patient Account Number: [id number] ?Date of Birth/Sex: Treating RN: ?[birth year] (88 y.o. F) Boehlein, Linda ?Primary Care Provider: SYSTEM, PRO V IDER Other Clinician: ?Referring Provider: ?Treating Provider/Extender: Fredirick Maudlin ?Weeks in Treatment: 9 ?History of Present Illness ?HPI Description: ADMISSION ?02/01/2022 ?This is an 88 year old woman who is a resident of [place]. She has a past medical history notable for type 2 diabetes mellitus, congestive heart failure, ?pulmonary hypertension, and complete heart block. She is accompanied today by her son. Approximately 4 months ago, she had an ingrown toe nail removed. ?Apparently this went a bit deep. Since that time, it has not healed. The patient is nonambulatory. Apparently they have been applying Santyl to the site at her ?facility. She was on antibiotics but I am not certain which specific drugs she took. She completed her last course a few weeks ago. We were unable to obtain ?ABIs in clinic secondary to the patient's body habitus. She has not had any formal vascular studies nor any x-ray of the area. ?02/09/2022: The wound is roughly the same size today, but the amount of slough in the base has decreased significantly. She  apparently had an x-ray done at ?her facility, but we do not have the report; per verbal report from facility staff, "there was nothing there." We had requested formal ABI/TBI studies, but these ?were not done. She has been in [place] under [employer]. ?02/16/2022: The wound measured about the same today, but to my eye, it appears rather smaller.  The slough in the base is minimal. Her vascular studies are ?scheduled for Friday. We have been using Santyl under Hydrofera Blue. No concern for infection. ?02/23/2022: Once again, the wound has been measured about the same size, but I think it looks a bit smaller. There is slough at the base. She had her ABIs done ?last week and these show severe bilateral peripheral arterial disease. We received the report of the foot x-ray that was ordered. There is no concern for ?osteomyelitis at this time. ?03/02/2022: The wound is smaller today with a small amount of slough at the base. We placed a referral to vein and vascular surgery last week in response to ?her severe peripheral arterial disease, but she does not yet have an appointment on the books. ?03/16/2022: She saw Dr. Stanford Breed in the vascular surgery department yesterday. He did not think she was a good candidate for any revascularization options ?and suggested that she continue local wound care for another month. If she fails to heal in that time, they may consider an angiogram with potential ?intervention. The wound today is roughly the same size but there is less exposed bone at the base and the surface is clean without any substantial slough. ?03/24/2022: The wound appears about the same. There is some periwound callus that has heaped up. We changed her to Prisma silver collagen last week. ?04/06/2022: No significant change to the wound. It is neither improving nor deteriorating. She continues to accumulate some periwound callus around the margin. ?Bone is exposed at the base. ?Electronic Signature(s) ?Signed: 04/06/2022  2:44:56 PM By: Fredirick Maudlin MD FACS ?Entered By: Fredirick Maudlin on 04/06/2022 14:44:56 ?-------------------------------------------------------------------------------- ?Physical Exam Details ?Patient Name: Date of Service: ?[name], [name] 04/06/2022 2:00 PM ?Medical Record Number: [id number] ?Patient Account Number: [id number] ?Date of Birth/Sex: Treating RN: ?October 20, 1931 (88 y.o. F) Boehlein, Linda ?Primary Care Provider: SYSTEM, PRO V IDER Other Clinician: ?Referring Provider: ?Treating Provider/Extender: Fredirick Maudlin ?Weeks in Treatment: 9 ?Constitutional ?. . . . No acute distress. ?Respiratory ?Normal work of breathing on room air. ?Notes ?04/06/2022: No real change to the wound. There is some periwound callus accumulation. Bone remains exposed. ?Electronic Signature(s) ?Signed: 04/06/2022 2:48:43 PM By: Fredirick Maudlin MD FACS ?Entered By: Fredirick Maudlin on 04/06/2022 14:48:43 ?-------------------------------------------------------------------------------- ?Physician Orders Details ?Patient Name: Date of Service: ?[name], [name] 04/06/2022 2:00 PM ?Medical Record Number: [id number] ?Patient Account Number: [id number] ?Date of Birth/Sex: Treating RN: ?02-25-1931 (88 y.o. F) Boehlein, Linda ?Primary Care Provider: SYSTEM, PRO V IDER Other Clinician: ?Referring Provider: ?Treating Provider/Extender: Fredirick Maudlin ?Weeks in Treatment: 9 ?Verbal / Phone Orders: No ?Diagnosis Coding ?ICD-10 Coding ?Code Description ?E11.621 Type 2 diabetes mellitus with foot ulcer ?[id number] Non-pressure chronic ulcer of other part of left foot with fat layer exposed ?99991111 Chronic diastolic (congestive) heart failure ?I27.20 Pulmonary hypertension, unspecified ?Follow-up Appointments ?ppointment in 2 weeks. - Dr. Celine Ahr - Room 1 ?Return A ?Wed. 5 ?Bathing/ Shower/ Hygiene ?May shower with protection but do not get wound dressing(s) wet. ?Off-Loading ?Other: - No direct pressure on toes ?Wound Treatment ?Wound #1 - T  Great ?oe Wound Laterality: Left ?Cleanser: Wound Cleanser Every Other Day/30 Days ?Discharge Instructions: Cleanse the wound with wound cleanser prior to applying a clean dressing using gauze sponges, not tissue

## 2022-04-07 NOTE — Progress Notes (Signed)
[name], [name] ([id number]) Visit Report for 04/06/2022 Arrival Information Details Patient Name: Date of Service: [name], [name] 04/06/2022 2:00 PM Medical Record Number: [id number] Patient Account Number: [id number] Date of Birth/Sex: Treating RN: 12-Dec-1930 (88 y.o. [name] Primary Care Camrin Lapre: SYSTEM, PRO Ave Filter Other Clinician: Referring Reyansh Kushnir: Treating Ottilie Wigglesworth/Extender: Jadene Pierini in Treatment: 9 Visit Information History Since Last Visit Added or deleted any medications: No Patient Arrived: Wheel Chair Any new allergies or adverse reactions: No Arrival Time: 14:14 Had a fall or experienced change in No Accompanied By: son activities of daily living that may affect Transfer Assistance: None risk of falls: Patient Identification Verified: Yes Signs or symptoms of abuse/neglect since last visito No Secondary Verification Process Completed: Yes Hospitalized since last visit: No Patient Requires Transmission-Based Precautions: No Implantable device outside of the clinic excluding No Patient Has Alerts: Yes cellular tissue based products placed in the center Patient Alerts: ABI not obtainable since last visit: Has Dressing in Place as Prescribed: Yes Pain Present Now: No Electronic Signature(s) Signed: 04/07/2022 8:19:25 AM By: Sandre Kitty Entered By: Sandre Kitty on 04/06/2022 14:15:01 -------------------------------------------------------------------------------- Encounter Discharge Information Details Patient Name: Date of Service: [name], [name] S. 04/06/2022 2:00 PM Medical Record Number: [id number] Patient Account Number: [id number] Date of Birth/Sex: Treating RN: 06-Sep-1931 (88 y.o. [name] Primary Care Shaketta Rill: SYSTEM, PRO Ave Filter Other Clinician: Referring Ezell Poke: Treating Levii Hairfield/Extender: Jadene Pierini in Treatment: 9 Encounter Discharge Information Items Post Procedure Vitals Discharge  Condition: Stable Temperature (F): 98.5 Ambulatory Status: Wheelchair Pulse (bpm): 89 Discharge Destination: [place] Respiratory Rate (breaths/min): 18 Telephoned: No Blood Pressure (mmHg): 112/68 Orders Sent: Yes Transportation: Other Accompanied By: son Schedule Follow-up Appointment: Yes Clinical Summary of Care: Patient Declined Electronic Signature(s) Signed: 04/06/2022 6:28:42 PM By: Baruch Gouty RN, BSN Entered By: Baruch Gouty on 04/06/2022 14:54:08 -------------------------------------------------------------------------------- Lower Extremity Assessment Details Patient Name: Date of Service: [name], [name] Camps. 04/06/2022 2:00 PM Medical Record Number: [id number] Patient Account Number: [id number] Date of Birth/Sex: Treating RN: 1931/07/18 (88 y.o. [name] Primary Care Rosabel Sermeno: SYSTEM, PRO Ave Filter Other Clinician: Referring Arliene Rosenow: Treating Jenisis Harmsen/Extender: Jadene Pierini in Treatment: 9 Edema Assessment Assessed: [Left: No] [Right: No] Edema: [Left: Ye] [Right: s] Calf Left: Right: Point of Measurement: 26 cm From Medial Instep 39.5 cm Ankle Left: Right: Point of Measurement: 10 cm From Medial Instep 25 cm Vascular Assessment Pulses: Dorsalis Pedis Palpable: [Left:Yes] Electronic Signature(s) Signed: 04/06/2022 6:21:21 PM By: Dellie Catholic RN Signed: 04/06/2022 6:28:42 PM By: Baruch Gouty RN, BSN Entered By: Baruch Gouty on 04/06/2022 14:28:45 -------------------------------------------------------------------------------- Multi Wound Chart Details Patient Name: Date of Service: [name], [name] Camps. 04/06/2022 2:00 PM Medical Record Number: [id number] Patient Account Number: [id number] Date of Birth/Sex: Treating RN: Mar 05, 1931 (88 y.o. [name] Primary Care Nevena Rozenberg: SYSTEM, PRO Ave Filter Other Clinician: Referring Wilhelm Ganaway: Treating Kayshawn Ozburn/Extender: Jadene Pierini in Treatment:  9 Vital Signs Height(in): Pulse(bpm): 89 Weight(lbs): Blood Pressure(mmHg): 112/58 Body Mass Index(BMI): Temperature(F): 98.5 Respiratory Rate(breaths/min): 18 Photos: [1:Left T Great oe] [N/A:N/A N/A] Wound Location: [1:Gradually Appeared] [N/A:N/A] Wounding Event: [1:Diabetic Wound/Ulcer of the Lower] [N/A:N/A] Primary Etiology: [1:Extremity Arrhythmia, Congestive Heart Failure, N/A] Comorbid History: [1:Hypertension, Type II Diabetes, Gout, Osteoarthritis 09/29/2021] [N/A:N/A] Date Acquired: [1:9] [N/A:N/A] Weeks of Treatment: [1:Open] [N/A:N/A] Wound Status: [1:No] [N/A:N/A] Wound Recurrence: [1:0.6x0.5x0.2] [N/A:N/A] Measurements L x W x D (cm) [1:0.236] [N/A:N/A] A (cm) : rea [1:0.047] [N/A:N/A] Volume (cm) : [1:16.60%] [N/A:N/A] % Reduction in A [1:rea: 17.50%] [  N/A:N/A] % Reduction in Volume: [1:Grade 2] [N/A:N/A] Classification: [1:Medium] [N/A:N/A] Exudate A mount: [1:Serous] [N/A:N/A] Exudate Type: [1:amber] [N/A:N/A] Exudate Color: [1:Flat and Intact] [N/A:N/A] Wound Margin: [1:None Present (0%)] [N/A:N/A] Granulation A mount: [1:Small (1-33%)] [N/A:N/A] Necrotic A mount: [1:Fat Layer (Subcutaneous Tissue): Yes N/A] Exposed Structures: [1:Bone: Yes Fascia: No Tendon: No Muscle: No Joint: No Small (1-33%)] [N/A:N/A] Epithelialization: [1:Debridement - Selective/Open Wound N/A] Debridement: Pre-procedure Verification/Time Out 14:35 [N/A:N/A] Taken: [1:Lidocaine 5% topical ointment] [N/A:N/A] Pain Control: [1:Slough] [N/A:N/A] Tissue Debrided: [1:Skin/Epidermis] [N/A:N/A] Level: [1:0.3] [N/A:N/A] Debridement A (sq cm): [1:rea Curette] [N/A:N/A] Instrument: [1:None] [N/A:N/A] Bleeding: [1:0] [N/A:N/A] Procedural Pain: [1:0] [N/A:N/A] Post Procedural Pain: [1:Procedure was tolerated well] [N/A:N/A] Debridement Treatment Response: [1:0.6x0.5x0.2] [N/A:N/A] Post Debridement Measurements L x W x D (cm) [1:0.047] [N/A:N/A] Post Debridement Volume: (cm)  [1:Debridement] [N/A:N/A] Treatment Notes Electronic Signature(s) Signed: 04/06/2022 2:44:18 PM By: Duanne Guess MD FACS Signed: 04/06/2022 6:28:42 PM By: Zenaida Deed RN, BSN Entered By: Duanne Guess on 04/06/2022 14:44:18 -------------------------------------------------------------------------------- Multi-Disciplinary Care Plan Details Patient Name: Date of Service: [name], [name] Portela. 04/06/2022 2:00 PM Medical Record Number: [id number] Patient Account Number: [id number] Date of Birth/Sex: Treating RN: May 05, 1931 (88 y.o. [name] Primary Care Wilhelmena Zea: SYSTEM, PRO Rodena Goldmann Other Clinician: Referring Maryn Freelove: Treating Meleane Selinger/Extender: [name] in Treatment: 9 Multidisciplinary Care Plan reviewed with physician Active Inactive Nutrition Nursing Diagnoses: Impaired glucose control: actual or potential Potential for alteratiion in Nutrition/Potential for imbalanced nutrition Goals: Patient/caregiver will maintain therapeutic glucose control Date Initiated: 02/01/2022 Target Resolution Date: 04/28/2022 Goal Status: Active Interventions: Assess HgA1c results as ordered upon admission and as needed Assess patient nutrition upon admission and as needed per policy Provide education on elevated blood sugars and impact on wound healing Provide education on nutrition Treatment Activities: Education provided on Nutrition : 02/01/2022 Notes: Wound/Skin Impairment Nursing Diagnoses: Impaired tissue integrity Knowledge deficit related to ulceration/compromised skin integrity Goals: Patient/caregiver will verbalize understanding of skin care regimen Date Initiated: 02/01/2022 Target Resolution Date: 04/28/2022 Goal Status: Active Ulcer/skin breakdown will have a volume reduction of 30% by week 4 Date Initiated: 02/01/2022 Date Inactivated: 03/24/2022 Target Resolution Date: 03/31/2022 Goal Status: Unmet Unmet Reason: PAD Ulcer/skin breakdown will have a  volume reduction of 50% by week 8 Date Initiated: 04/06/2022 Target Resolution Date: 04/27/2022 Goal Status: Active Interventions: Assess patient/caregiver ability to obtain necessary supplies Assess patient/caregiver ability to perform ulcer/skin care regimen upon admission and as needed Assess ulceration(s) every visit Provide education on ulcer and skin care Notes: Electronic Signature(s) Signed: 04/06/2022 6:28:42 PM By: Zenaida Deed RN, BSN Entered By: Zenaida Deed on 04/06/2022 14:32:27 -------------------------------------------------------------------------------- Pain Assessment Details Patient Name: Date of Service: [name]. 04/06/2022 2:00 PM Medical Record Number: [id number] Patient Account Number: [id number] Date of Birth/Sex: Treating RN: 1931-01-26 (88 y.o. [name] Primary Care Chessa Barrasso: SYSTEM, PRO Rodena Goldmann Other Clinician: Referring Pratyush Ammon: Treating Caleen Taaffe/Extender: [name] in Treatment: 9 Active Problems Location of Pain Severity and Description of Pain Patient Has Paino No Site Locations Pain Management and Medication Current Pain Management: Electronic Signature(s) Signed: 04/06/2022 6:28:42 PM By: Zenaida Deed RN, BSN Signed: 04/07/2022 8:19:25 AM By: Karl Ito Entered By: Karl Ito on 04/06/2022 14:15:22 -------------------------------------------------------------------------------- Patient/Caregiver Education Details Patient Name: Date of Service: [name] 5/17/2023andnbsp2:00 PM Medical Record Number: [id number] Patient Account Number: [id number] Date of Birth/Gender: Treating RN: December 02, 1930 (88 y.o. [name] Primary Care Physician: SYSTEM, PRO V IDER Other Clinician: Referring Physician: Treating Physician/Extender: [name] in Treatment: 9  Education Assessment Education Provided To: Patient Education Topics Provided Elevated Blood Sugar/ Impact on  Healing: Methods: Explain/Verbal Responses: Reinforcements needed, State content correctly Wound/Skin Impairment: Methods: Explain/Verbal Responses: Reinforcements needed, State content correctly Electronic Signature(s) Signed: 04/06/2022 6:28:42 PM By: Baruch Gouty RN, BSN Entered By: Baruch Gouty on 04/06/2022 14:32:59 -------------------------------------------------------------------------------- Wound Assessment Details Patient Name: Date of Service: [name] Gable. 04/06/2022 2:00 PM Medical Record Number: [id number] Patient Account Number: [id number] Date of Birth/Sex: Treating RN: 1931-08-31 (88 y.o. [name] Primary Care Jaysiah Marchetta: SYSTEM, PRO V IDER Other Clinician: Referring Nimai Burbach: Treating Chiyo Fay/Extender: Jadene Pierini in Treatment: 9 Wound Status Wound Number: 1 Primary Diabetic Wound/Ulcer of the Lower Extremity Etiology: Wound Location: Left T Great oe Wound Open Wounding Event: Gradually Appeared Status: Date Acquired: 09/29/2021 Comorbid Arrhythmia, Congestive Heart Failure, Hypertension, Type II Weeks Of Treatment: 9 History: Diabetes, Gout, Osteoarthritis Clustered Wound: No Photos Wound Measurements Length: (cm) 0.6 Width: (cm) 0.5 Depth: (cm) 0.2 Area: (cm) 0.236 Volume: (cm) 0.047 % Reduction in Area: 16.6% % Reduction in Volume: 17.5% Epithelialization: Small (1-33%) Tunneling: No Undermining: No Wound Description Classification: Grade 2 Wound Margin: Flat and Intact Exudate Amount: Medium Exudate Type: Serous Exudate Color: amber Foul Odor After Cleansing: No Slough/Fibrino Yes Wound Bed Granulation Amount: None Present (0%) Exposed Structure Necrotic Amount: Small (1-33%) Fascia Exposed: No Necrotic Quality: Adherent Slough Fat Layer (Subcutaneous Tissue) Exposed: Yes Tendon Exposed: No Muscle Exposed: No Joint Exposed: No Bone Exposed: Yes Treatment Notes Wound #1 (Toe Great) Wound Laterality:  Left Cleanser Wound Cleanser Discharge Instruction: Cleanse the wound with wound cleanser prior to applying a clean dressing using gauze sponges, not tissue or cotton balls. Peri-Wound Care Topical Primary Dressing Hydrofera Blue Ready Foam, 2.5 x2.5 in Discharge Instruction: Apply to wound bed as instructed Secondary Dressing Woven Gauze Sponges 2x2 in Discharge Instruction: Apply over primary dressing as directed. Secured With Conforming Stretch Gauze Bandage, Sterile 2x75 (in/in) Discharge Instruction: Secure with stretch gauze as directed. Paper Tape, 1x10 (in/yd) Discharge Instruction: Secure dressing with tape as directed. Compression Wrap Compression Stockings Add-Ons Electronic Signature(s) Signed: 04/06/2022 6:28:42 PM By: Baruch Gouty RN, BSN Entered By: Baruch Gouty on 04/06/2022 14:30:38 -------------------------------------------------------------------------------- Vitals Details Patient Name: Date of Service: [name], [name] S. 04/06/2022 2:00 PM Medical Record Number: [id number] Patient Account Number: [id number] Date of Birth/Sex: Treating RN: [birth year] (88 y.o. [name] Primary Care Mala Gibbard: SYSTEM, PRO V IDER Other Clinician: Referring Izac Faulkenberry: Treating Brighten Buzzelli/Extender: Jadene Pierini in Treatment: 9 Vital Signs Time Taken: 14:15 Temperature (F): 98.5 Pulse (bpm): 89 Respiratory Rate (breaths/min): 18 Blood Pressure (mmHg): 112/58 Reference Range: 80 - 120 mg / dl Electronic Signature(s) Signed: 04/07/2022 8:19:25 AM By: Sandre Kitty Entered By: Sandre Kitty on 04/06/2022 14:15:16

## 2022-04-08 ENCOUNTER — Encounter: Payer: Self-pay | Admitting: Internal Medicine

## 2022-04-08 ENCOUNTER — Ambulatory Visit (INDEPENDENT_AMBULATORY_CARE_PROVIDER_SITE_OTHER): Payer: Medicare Other | Admitting: Internal Medicine

## 2022-04-08 VITALS — BP 100/56 | HR 88 | Ht <= 58 in | Wt 218.0 lb

## 2022-04-08 DIAGNOSIS — I10 Essential (primary) hypertension: Secondary | ICD-10-CM

## 2022-04-08 DIAGNOSIS — I442 Atrioventricular block, complete: Secondary | ICD-10-CM

## 2022-04-08 DIAGNOSIS — Z950 Presence of cardiac pacemaker: Secondary | ICD-10-CM | POA: Diagnosis not present

## 2022-04-08 DIAGNOSIS — I70245 Atherosclerosis of native arteries of left leg with ulceration of other part of foot: Secondary | ICD-10-CM | POA: Diagnosis not present

## 2022-04-08 DIAGNOSIS — I5032 Chronic diastolic (congestive) heart failure: Secondary | ICD-10-CM | POA: Diagnosis not present

## 2022-04-08 NOTE — Patient Instructions (Signed)
Medication Instructions:  Your physician recommends that you continue on your current medications as directed. Please refer to the Current Medication list given to you today.  Labwork: None ordered.  Testing/Procedures: None ordered.  Follow-Up: Your physician wants you to follow-up in: one year with Lewayne Bunting, MD or one of the following Advanced Practice Providers on your designated Care Team:   Francis Dowse, [place] Casimiro Needle "Mardelle Matte" [place], [place] You will receive a reminder letter in the mail two months in advance. If you don't receive a letter, please call our office to schedule the follow-up appointment.  Remote monitoring is used to monitor your Pacemaker from home. This monitoring reduces the number of office visits required to check your device to one time per year. It allows [place] to keep an eye on the functioning of your device to ensure it is working properly. You are scheduled for a device check from home on 07/08/2022. You may send your transmission at any time that day. If you have a wireless device, the transmission will be sent automatically. After your physician reviews your transmission, you will receive a postcard with your next transmission date.   Any Other Special Instructions Will Be Listed Below (If Applicable).  If you need a refill on your cardiac medications before your next appointment, please call your pharmacy.   Important Information About Sugar

## 2022-04-08 NOTE — Progress Notes (Signed)
HPI Mrs. Dantes returns today for followup. She is a pleasant 88 yo woman, with a h/o CHB, s/p PPM chronic, venous stasis, now wheelchair bound who present for ongoing evaluation of her PPM and CHB. She denies chest pain and her dyspnea is now wheelchair bound. She has undergone PPM gen change out.  Allergies  Allergen Reactions   Aspirin Rash and Other (See Comments)    GI distress   Tramadol Other (See Comments)    GI distress   Naproxen    Shellfish-Derived Products Itching and Rash     Current Outpatient Medications  Medication Sig Dispense Refill   Biotin 5000 MCG CAPS Take 5,000 mcg by mouth daily. (0900)     carvedilol (COREG) 6.25 MG tablet Take 6.25 mg by mouth 2 (two) times daily with a meal. (0900 & 1700)     Cholecalciferol (VITAMIN D) 50 MCG (2000 UT) CAPS Take 2,000 Units by mouth in the morning. (0900)     furosemide (LASIX) 80 MG tablet Take 80 mg by mouth in the morning. (0900)     gabapentin (NEURONTIN) 300 MG capsule Take 300 mg by mouth 2 (two) times daily. (0900 & 2100)     HYDROcodone-acetaminophen (NORCO/VICODIN) 5-325 MG tablet Take 1 tablet by mouth every 6 (six) hours as needed (pain).     loratadine (CLARITIN) 10 MG tablet Take 10 mg by mouth in the morning. (0900)     magnesium oxide (MAG-OX) 400 (241.3 MG) MG tablet TAKE ONE TABLET BY MOUTH ONE TIME DAILY 30 tablet 4   melatonin 3 MG TABS tablet Take 3 mg by mouth at bedtime. (2100)     Menthol, Topical Analgesic, (BIOFREEZE COLORLESS EX) Apply 1 application topically 2 (two) times daily.     metolazone (ZAROXOLYN) 2.5 MG tablet Take one tablet by mouth 4 days a week.  Take 30 minutes prior to furosemide (Patient taking differently: Take one tablet by mouth 4 days a week Monday, Wednesday, Friday, Sunday.  Take 30 minutes prior to furosemide) 64 tablet 3   Multiple Vitamins-Minerals (CERTA-VITE PO) Take 1 tablet by mouth daily. (0900)     nitroGLYCERIN (NITROSTAT) 0.4 MG SL tablet Place 0.4 mg under  the tongue every 5 (five) minutes as needed for chest pain (X 3 DOSES FOR CHEST PAIN).     polyethylene glycol powder (GLYCOLAX/MIRALAX) powder Take 17 g by mouth daily. (0900)     potassium chloride (KLOR-CON) 10 MEQ tablet Take 40 mEq by mouth 2 (two) times daily. (0900 & 2100)     sitaGLIPtin (JANUVIA) 50 MG tablet Take 50 mg by mouth in the morning. (0900)     Vitamins A & D (VITAMIN A & D) ointment Apply 1 application topically every 6 (six) hours as needed for dry skin (protection).     No current facility-administered medications for this visit.     Past Medical History:  Diagnosis Date   Arthritis    "qwhere"   AV block     2:1  January 14, 2011 / pacemaker plan when cellulitis resolved in length   Bradycardia    2:1  AV block   Cellulitis    Superficial cellulitis right lower leg   January 14, 2011   Chronic diastolic CHF (congestive heart failure) (HCC)    Diabetes mellitus without complication (HCC)    Edema    . EF 65-70%, echo, November 29, 2010   Ejection fraction    EF 65%, echo, January, 2012  Hypertension    Hypokalemia    October, 2012   Murmur    No significant valvular disease by echo, January, 2012   Pacemaker    January, 2013 Medtronic Sensa   Pulmonary hypertension (HCC)     46 mmHg... echo... January, 2012    ROS:   All systems reviewed and negative except as noted in the HPI.   Past Surgical History:  Procedure Laterality Date   CHOLECYSTECTOMY     HIP ARTHROPLASTY Right    INSERT / REPLACE / REMOVE PACEMAKER  11/2011   PARTIAL HYSTERECTOMY     vaginal   PERMANENT PACEMAKER INSERTION N/A 12/09/2011   Procedure: PERMANENT PACEMAKER INSERTION;  Surgeon: Marinus Maw, MD;  Location: [facility] CATH LAB;  Service: Cardiovascular;  Laterality: N/A;   PPM GENERATOR CHANGEOUT N/A 01/03/2022   Procedure: PPM GENERATOR CHANGEOUT;  Surgeon: Marinus Maw, MD;  Location: [facility] INVASIVE CV LAB;  Service: Cardiovascular;  Laterality: N/A;     Family History   Problem Relation Age of Onset   Cardiomyopathy Father    Diabetes Father    Heart failure Father    Dementia Mother    Cancer Son    Coronary artery disease Son    Heart attack Neg Hx      Social History   Socioeconomic History   Marital status: Widowed    Spouse name: Not on file   Number of children: Not on file   Years of education: Not on file   Highest education level: Not on file  Occupational History   Occupation: Retired    Associate Professor: RETIRED  Tobacco Use   Smoking status: Never   Smokeless tobacco: Never  Vaping Use   Vaping Use: Never used  Substance and Sexual Activity   Alcohol use: No   Drug use: No   Sexual activity: Never  Other Topics Concern   Not on file  Social History Narrative   Retired    Widowed    Tobacco Use - No.    Alcohol Use - no   Regular Exercise - no   Drug Use - no   Social Determinants of Corporate investment banker Strain: Not on file  Food Insecurity: Not on file  Transportation Needs: Not on file  Physical Activity: Not on file  Stress: Not on file  Social Connections: Not on file  Intimate Partner Violence: Not on file     BP (!) 100/56   Pulse 88   Ht 4\' 7"  (1.397 m)   Wt 218 lb (98.9 kg)   SpO2 (!) 77% Comment: cold fingers  BMI 50.67 kg/m   Physical Exam:  Well appearing NAD HEENT: Unremarkable Neck:  No JVD, no thyromegally Lymphatics:  No adenopathy Back:  No CVA tenderness Lungs:  Clear HEART:  Regular rate rhythm, no murmurs, no rubs, no clicks Abd:  soft, positive bowel sounds, no organomegally, no rebound, no guarding Ext:  2 plus pulses, no edema, no cyanosis, no clubbing Skin:  No rashes no nodules Neuro:  CN II through XII intact, motor grossly intact  EKG - nsr with pacing induced LBBB  DEVICE  Normal device function.  See PaceArt for details.   Assess/Plan:   1. CHB -she is asymptomatic, s/p PPM insertion. 2. PPM - her Medtronic DDD PM is working normally after PM gen change  out. 3. HTN - her blood pressure is well controlled. She will continue her current meds. 4. Chronic diastolic heart failure - her  lasix dose is stable and she will maintain a low sodium diet.   Sharlot Gowda Danean Marner,MD

## 2022-04-11 ENCOUNTER — Ambulatory Visit (INDEPENDENT_AMBULATORY_CARE_PROVIDER_SITE_OTHER): Payer: Medicare Other

## 2022-04-11 DIAGNOSIS — I442 Atrioventricular block, complete: Secondary | ICD-10-CM

## 2022-04-12 LAB — CUP PACEART REMOTE DEVICE CHECK
Battery Remaining Longevity: 141 mo
Battery Voltage: 3.19 V
Brady Statistic AP VP Percent: 6.06 %
Brady Statistic AP VS Percent: 0 %
Brady Statistic AS VP Percent: 93.92 %
Brady Statistic AS VS Percent: 0.01 %
Brady Statistic RA Percent Paced: 6.05 %
Brady Statistic RV Percent Paced: 99.99 %
Date Time Interrogation Session: 20230519122914
Implantable Lead Implant Date: 20130118
Implantable Lead Implant Date: 20130118
Implantable Lead Location: 753859
Implantable Lead Location: 753860
Implantable Lead Model: 5076
Implantable Lead Model: 5076
Implantable Pulse Generator Implant Date: 20230213
Lead Channel Impedance Value: 342 Ohm
Lead Channel Impedance Value: 380 Ohm
Lead Channel Impedance Value: 399 Ohm
Lead Channel Impedance Value: 418 Ohm
Lead Channel Pacing Threshold Amplitude: 1 V
Lead Channel Pacing Threshold Amplitude: 1.125 V
Lead Channel Pacing Threshold Pulse Width: 0.4 ms
Lead Channel Pacing Threshold Pulse Width: 0.4 ms
Lead Channel Sensing Intrinsic Amplitude: 1 mV
Lead Channel Sensing Intrinsic Amplitude: 2.625 mV
Lead Channel Setting Pacing Amplitude: 2 V
Lead Channel Setting Pacing Amplitude: 2.25 V
Lead Channel Setting Pacing Pulse Width: 0.4 ms
Lead Channel Setting Sensing Sensitivity: 2.8 mV

## 2022-04-18 NOTE — Progress Notes (Unsigned)
[facility] [place]  ASSESSMENT / PLAN: [name] is a 88 y.o. female with atherosclerosis of native arteries of left lower extremity causing ulceration.  Recommend the following which can slow the progression of atherosclerosis and reduce the risk of major adverse cardiac / limb events:  Complete cessation from all tobacco products. Blood glucose control with goal A1c < 7%. Blood pressure control with goal blood pressure < 140/90 mmHg. Lipid reduction therapy with goal LDL-C <100 mg/dL (<86 if symptomatic from PAD).  Aspirin 81mg  PO QD.  Atorvastatin 40-80mg  PO QD (or other "high intensity" statin therapy).  The patient is not a good candidate for revascularization.  I have encouraged her to try local wound care for another month.  She thinks this is reasonable.  I will see her again at that time to discuss an angiogram.  CHIEF COMPLAINT: Ulcer about left great toe.  HISTORY OF PRESENT ILLNESS: [name] is a 88 y.o. female referred to clinic from the wound care center for evaluation of a left great toe ulcer.  She has been under the care of Dr. [id number].  The patient is an elderly, nonambulatory, nursing home resident who is morbidly obese but is mentally quite sharp.  She reports the ulcer has been getting slowly better with local wound care.  Past Medical History:  Diagnosis Date   Arthritis    "qwhere"   AV block     2:1  January 14, 2011 / pacemaker plan when cellulitis resolved in length   Bradycardia    2:1  AV block   Cellulitis    Superficial cellulitis right lower leg   January 14, 2011   Chronic diastolic CHF (congestive heart failure) (HCC)    Diabetes mellitus without complication (HCC)    Edema    . EF 65-70%, echo, November 29, 2010   Ejection fraction    EF 65%, echo, January, 2012   Hypertension    Hypokalemia    October, 2012   Murmur    No significant valvular disease by echo, January, 2012   Pacemaker    January, 2013  Medtronic Sensa   Pulmonary hypertension (HCC)     46 mmHg... echo... January, 2012    Past Surgical History:  Procedure Laterality Date   CHOLECYSTECTOMY     HIP ARTHROPLASTY Right    INSERT / REPLACE / REMOVE PACEMAKER  11/2011   PARTIAL HYSTERECTOMY     vaginal   PERMANENT PACEMAKER INSERTION N/A 12/09/2011   Procedure: PERMANENT PACEMAKER INSERTION;  Surgeon: 12/11/2011, MD;  Location: [facility] CATH LAB;  Service: Cardiovascular;  Laterality: N/A;   PPM GENERATOR CHANGEOUT N/A 01/03/2022   Procedure: PPM GENERATOR CHANGEOUT;  Surgeon: 01/05/2022, MD;  Location: [facility] INVASIVE CV LAB;  Service: Cardiovascular;  Laterality: N/A;    Family History  Problem Relation Age of Onset   Cardiomyopathy Father    Diabetes Father    Heart failure Father    Dementia Mother    Cancer Son    Coronary artery disease Son    Heart attack Neg Hx     Social History   Socioeconomic History   Marital status: Widowed    Spouse name: Not on file   Number of children: Not on file   Years of education: Not on file   Highest education level: Not on file  Occupational History   Occupation: Retired    [employer]: RETIRED  Tobacco Use   Smoking status: Never  Smokeless tobacco: Never  Vaping Use   Vaping Use: Never used  Substance and Sexual Activity   Alcohol use: No   Drug use: No   Sexual activity: Never  Other Topics Concern   Not on file  Social History Narrative   Retired    Widowed    Tobacco Use - No.    Alcohol Use - no   Regular Exercise - no   Drug Use - no   Social Determinants of Corporate investment banker Strain: Not on file  Food Insecurity: Not on file  Transportation Needs: Not on file  Physical Activity: Not on file  Stress: Not on file  Social Connections: Not on file  Intimate Partner Violence: Not on file    Allergies  Allergen Reactions   Aspirin Rash and Other (See Comments)    GI distress   Tramadol Other (See Comments)    GI distress   Naproxen     Shellfish-Derived Products Itching and Rash    Current Outpatient Medications  Medication Sig Dispense Refill   Biotin 5000 MCG CAPS Take 5,000 mcg by mouth daily. (0900)     carvedilol (COREG) 6.25 MG tablet Take 6.25 mg by mouth 2 (two) times daily with a meal. (0900 & 1700)     Cholecalciferol (VITAMIN D) 50 MCG (2000 UT) CAPS Take 2,000 Units by mouth in the morning. (0900)     furosemide (LASIX) 80 MG tablet Take 80 mg by mouth in the morning. (0900)     gabapentin (NEURONTIN) 300 MG capsule Take 300 mg by mouth 2 (two) times daily. (0900 & 2100)     HYDROcodone-acetaminophen (NORCO/VICODIN) 5-325 MG tablet Take 1 tablet by mouth every 6 (six) hours as needed (pain).     loratadine (CLARITIN) 10 MG tablet Take 10 mg by mouth in the morning. (0900)     magnesium oxide (MAG-OX) 400 (241.3 MG) MG tablet TAKE ONE TABLET BY MOUTH ONE TIME DAILY 30 tablet 4   melatonin 3 MG TABS tablet Take 3 mg by mouth at bedtime. (2100)     Menthol, Topical Analgesic, (BIOFREEZE COLORLESS EX) Apply 1 application topically 2 (two) times daily.     metolazone (ZAROXOLYN) 2.5 MG tablet Take one tablet by mouth 4 days a week.  Take 30 minutes prior to furosemide (Patient taking differently: Take one tablet by mouth 4 days a week Monday, Wednesday, Friday, Sunday.  Take 30 minutes prior to furosemide) 64 tablet 3   Multiple Vitamins-Minerals (CERTA-VITE PO) Take 1 tablet by mouth daily. (0900)     nitroGLYCERIN (NITROSTAT) 0.4 MG SL tablet Place 0.4 mg under the tongue every 5 (five) minutes as needed for chest pain (X 3 DOSES FOR CHEST PAIN).     polyethylene glycol powder (GLYCOLAX/MIRALAX) powder Take 17 g by mouth daily. (0900)     potassium chloride (KLOR-CON) 10 MEQ tablet Take 40 mEq by mouth 2 (two) times daily. (0900 & 2100)     sitaGLIPtin (JANUVIA) 50 MG tablet Take 50 mg by mouth in the morning. (0900)     Vitamins A & D (VITAMIN A & D) ointment Apply 1 application topically every 6 (six) hours as  needed for dry skin (protection).     No current facility-administered medications for this visit.    PHYSICAL EXAM There were no vitals filed for this visit.   Constitutional: Elderly woman in wheelchair.  No acute distress Cardiac: regular rate and rhythm.  Respiratory:  unlabored. Abdominal:  soft,  non-tender, non-distended.  Peripheral vascular: Palpable pedal pulses.  Small ulcer about the right great toe nailbed.  PERTINENT LABORATORY AND RADIOLOGIC DATA  Most recent CBC    Latest Ref Rng & Units 12/10/2021   12:41 PM 12/01/2020   12:41 PM 11/30/2020    3:37 PM  CBC  WBC 3.4 - 10.8 x10E3/uL 6.7   8.6   8.4    Hemoglobin 11.1 - 15.9 g/dL 75.9   16.3   84.6    Hematocrit 34.0 - 46.6 % 36.9   43.4   40.5    Platelets 150 - 450 x10E3/uL 290   291   296       Most recent CMP    Latest Ref Rng & Units 12/10/2021   12:41 PM 12/14/2020    2:08 PM 12/01/2020   12:41 PM  CMP  Glucose 70 - 99 mg/dL 659   935   87    BUN 8 - 27 mg/dL 34   41   31    Creatinine 0.57 - 1.00 mg/dL 7.01   7.79   3.90    Sodium 134 - 144 mmol/L 140   139   139    Potassium 3.5 - 5.2 mmol/L 4.7   4.3   3.7    Chloride 96 - 106 mmol/L 98   95   98    CO2 20 - 29 mmol/L 28   24   26     Calcium 8.7 - 10.3 mg/dL   30.0   92.3    Total Protein 6.0 - 8.5 g/dL 7.5    8.7    Total Bilirubin 0.0 - 1.2 mg/dL 30.0    0.9    Alkaline Phos 44 - 121 IU/L 106    81    AST 0 - 40 IU/L 31    26    ALT 0 - 32 IU/L 16    10      Renal function CrCl cannot be calculated (Patient's most recent lab result is older than the maximum 21 days allowed.).  Hgb A1c MFr Bld (%)  Date Value  12/31/2015 6.4 (H)    Vascular Imaging:  +-------+-----------+-----------+------------+------------+  ABI/TBIToday's ABIToday's TBIPrevious ABIPrevious TBI  +-------+-----------+-----------+------------+------------+  Right  0.74       0.38                                  +-------+-----------+-----------+------------+------------+  Left   0.49       bandage                              +-------+-----------+-----------+------------+------------+  02/28/2016. Rande Brunt, MD [facility] Phone Number: [phone] 04/18/2022 12:27 PM  Total time spent on preparing this encounter including chart review, data review, collecting history, examining the patient, coordinating care for this new patient, 60 minutes.  Portions of this report may have been transcribed using voice recognition software.  Every effort has been made to ensure accuracy; however, inadvertent computerized transcription errors may still be present.

## 2022-04-19 ENCOUNTER — Ambulatory Visit (INDEPENDENT_AMBULATORY_CARE_PROVIDER_SITE_OTHER): Payer: Medicare Other | Admitting: Vascular Surgery

## 2022-04-19 ENCOUNTER — Encounter: Payer: Self-pay | Admitting: Vascular Surgery

## 2022-04-19 VITALS — BP 135/64 | HR 80 | Temp 98.3°F | Resp 20 | Ht <= 58 in | Wt 218.0 lb

## 2022-04-19 DIAGNOSIS — I739 Peripheral vascular disease, unspecified: Secondary | ICD-10-CM | POA: Diagnosis not present

## 2022-04-20 ENCOUNTER — Encounter (HOSPITAL_BASED_OUTPATIENT_CLINIC_OR_DEPARTMENT_OTHER): Payer: Medicare Other | Admitting: General Surgery

## 2022-04-20 DIAGNOSIS — E11621 Type 2 diabetes mellitus with foot ulcer: Secondary | ICD-10-CM | POA: Diagnosis not present

## 2022-04-25 NOTE — Progress Notes (Signed)
LEVADA, NEMECEK ([id number]) Visit Report for 04/20/2022 Arrival Information Details Patient Name: Date of Service: RIHANNAH, VINCE 04/20/2022 2:00 PM Medical Record Number: [id number] Patient Account Number: [id number] Date of Birth/Sex: Treating RN: 03-18-1931 (88 y.o. Elam Dutch Primary Care Lanayah Gartley: SYSTEM, PRO V IDER Other Clinician: Referring Catharine Kettlewell: Treating Shantara Goosby/Extender: Jadene Pierini in Treatment: 11 Visit Information History Since Last Visit Added or deleted any medications: No Patient Arrived: Wheel Chair Any new allergies or adverse reactions: No Arrival Time: 14:16 Had a fall or experienced change in No Accompanied By: caregiver activities of daily living that may affect Transfer Assistance: None risk of falls: Patient Identification Verified: Yes Signs or symptoms of abuse/neglect since last visito No Secondary Verification Process Completed: Yes Hospitalized since last visit: No Patient Requires Transmission-Based Precautions: No Implantable device outside of the clinic excluding No Patient Has Alerts: Yes cellular tissue based products placed in the center Patient Alerts: ABI not obtainable since last visit: Has Dressing in Place as Prescribed: Yes Pain Present Now: No Electronic Signature(s) Signed: 04/20/2022 3:27:54 PM By: Sandre Kitty Entered By: Sandre Kitty on 04/20/2022 14:16:46 -------------------------------------------------------------------------------- Encounter Discharge Information Details Patient Name: Date of Service: [name], [name] 04/20/2022 2:00 PM Medical Record Number: [id number] Patient Account Number: [id number] Date of Birth/Sex: Treating RN: [birth year] (88 y.o. America Uncapher Primary Care Terryon Pineiro: SYSTEM, PRO Ave Filter Other Clinician: Referring Analyse Angst: Treating Colin Norment/Extender: Jadene Pierini in Treatment: 11 Encounter Discharge Information Items Post Procedure Vitals Discharge  Condition: Stable Temperature (F): 98.2 Ambulatory Status: Wheelchair Pulse (bpm): 88 Discharge Destination: Home Respiratory Rate (breaths/min): 18 Transportation: Private Auto Blood Pressure (mmHg): 126/70 Accompanied By: friend Schedule Follow-up Appointment: Yes Clinical Summary of Care: Patient Declined Electronic Signature(s) Signed: 04/20/2022 5:13:17 PM By: Dellie Catholic RN Entered By: Dellie Catholic on 04/20/2022 17:12:01 -------------------------------------------------------------------------------- Lower Extremity Assessment Details Patient Name: Date of Service: LEANETTE, BOWARD 04/20/2022 2:00 PM Medical Record Number: [id number] Patient Account Number: [id number] Date of Birth/Sex: Treating RN: June 08, 1931 (88 y.o. America Hilscher Primary Care Spero Gunnels: SYSTEM, PRO Ave Filter Other Clinician: Referring Grae Cannata: Treating Marcellene Shivley/Extender: Jadene Pierini in Treatment: 11 Edema Assessment Assessed: [Left: No] [Right: No] Edema: [Left: Ye] [Right: s] Calf Left: Right: Point of Measurement: 26 cm From Medial Instep 39.5 cm Ankle Left: Right: Point of Measurement: 10 cm From Medial Instep 25.2 cm Electronic Signature(s) Signed: 04/20/2022 5:13:17 PM By: Dellie Catholic RN Entered By: Dellie Catholic on 04/20/2022 14:43:04 -------------------------------------------------------------------------------- Multi Wound Chart Details Patient Name: Date of Service: [name], [name] 04/20/2022 2:00 PM Medical Record Number: [id number] Patient Account Number: [id number] Date of Birth/Sex: Treating RN: 1931-08-14 (88 y.o. Elam Dutch Primary Care Tranquilino Fischler: SYSTEM, PRO Ave Filter Other Clinician: Referring Humzah Harty: Treating Kendan Cornforth/Extender: Jadene Pierini in Treatment: 11 Vital Signs Height(in): Pulse(bpm): 5 Weight(lbs): Blood Pressure(mmHg): 126/70 Body Mass Index(BMI): Temperature(F): 98.2 Respiratory Rate(breaths/min):  18 Photos: [N/A:N/A] Left T Great oe N/A N/A Wound Location: Gradually Appeared N/A N/A Wounding Event: Diabetic Wound/Ulcer of the Lower N/A N/A Primary Etiology: Extremity Arrhythmia, Congestive Heart Failure, N/A N/A Comorbid History: Hypertension, Type II Diabetes, Gout, Osteoarthritis 09/29/2021 N/A N/A Date Acquired: 11 N/A N/A Weeks of Treatment: Open N/A N/A Wound Status: No N/A N/A Wound Recurrence: 0.5x0.4x0.2 N/A N/A Measurements L x W x D (cm) 0.157 N/A N/A A (cm) : rea 0.031 N/A N/A Volume (cm) : 44.50% N/A N/A % Reduction in A rea: 45.60% N/A N/A % Reduction in Volume: Grade 2 N/A N/A Classification:  Medium N/A N/A Exudate A mount: Serous N/A N/A Exudate Type: amber N/A N/A Exudate Color: Flat and Intact N/A N/A Wound Margin: Small (1-33%) N/A N/A Granulation A mount: Red N/A N/A Granulation Quality: Large (67-100%) N/A N/A Necrotic A mount: Fat Layer (Subcutaneous Tissue): Yes N/A N/A Exposed Structures: Fascia: No Tendon: No Muscle: No Joint: No Bone: No Small (1-33%) N/A N/A Epithelialization: Debridement - Excisional N/A N/A Debridement: Pre-procedure Verification/Time Out 14:56 N/A N/A Taken: Lidocaine 5% topical ointment N/A N/A Pain Control: Subcutaneous, Slough N/A N/A Tissue Debrided: Skin/Subcutaneous Tissue N/A N/A Level: 0.2 N/A N/A Debridement A (sq cm): rea Curette N/A N/A Instrument: Minimum N/A N/A Bleeding: Pressure N/A N/A Hemostasis A chieved: 2 N/A N/A Procedural Pain: 0 N/A N/A Post Procedural Pain: Procedure was tolerated well N/A N/A Debridement Treatment Response: 0.5x0.4x0.2 N/A N/A Post Debridement Measurements L x W x D (cm) 0.031 N/A N/A Post Debridement Volume: (cm) Debridement N/A N/A Procedures Performed: Treatment Notes Electronic Signature(s) Signed: 04/20/2022 3:04:12 PM By: Fredirick Maudlin MD FACS Signed: 04/25/2022 5:13:49 PM By: Baruch Gouty RN, BSN Entered By: Fredirick Maudlin on 04/20/2022 15:04:12 -------------------------------------------------------------------------------- Multi-Disciplinary Care Plan Details Patient Name: Date of Service: [name], [name] 04/20/2022 2:00 PM Medical Record Number: [id number] Patient Account Number: [id number] Date of Birth/Sex: Treating RN: [birth year] (88 y.o. America Mcgibbon Primary Care Blondie Riggsbee: SYSTEM, PRO Ave Filter Other Clinician: Referring Claritza July: Treating Aloysious Vangieson/Extender: Jadene Pierini in Treatment: 11 Multidisciplinary Care Plan reviewed with physician Active Inactive Nutrition Nursing Diagnoses: Impaired glucose control: actual or potential Potential for alteratiion in Nutrition/Potential for imbalanced nutrition Goals: Patient/caregiver will maintain therapeutic glucose control Date Initiated: 02/01/2022 Target Resolution Date: 04/28/2022 Goal Status: Active Interventions: Assess HgA1c results as ordered upon admission and as needed Assess patient nutrition upon admission and as needed per policy Provide education on elevated blood sugars and impact on wound healing Provide education on nutrition Treatment Activities: Education provided on Nutrition : 02/01/2022 Notes: Wound/Skin Impairment Nursing Diagnoses: Impaired tissue integrity Knowledge deficit related to ulceration/compromised skin integrity Goals: Patient/caregiver will verbalize understanding of skin care regimen Date Initiated: 02/01/2022 Target Resolution Date: 04/28/2022 Goal Status: Active Ulcer/skin breakdown will have a volume reduction of 30% by week 4 Date Initiated: 02/01/2022 Date Inactivated: 03/24/2022 Target Resolution Date: 03/31/2022 Goal Status: Unmet Unmet Reason: PAD Ulcer/skin breakdown will have a volume reduction of 50% by week 8 Date Initiated: 04/06/2022 Target Resolution Date: 04/27/2022 Goal Status: Active Interventions: Assess patient/caregiver ability to obtain necessary supplies Assess  patient/caregiver ability to perform ulcer/skin care regimen upon admission and as needed Assess ulceration(s) every visit Provide education on ulcer and skin care Notes: Electronic Signature(s) Signed: 04/20/2022 5:13:17 PM By: Dellie Catholic RN Entered By: Dellie Catholic on 04/20/2022 14:48:39 -------------------------------------------------------------------------------- Pain Assessment Details Patient Name: Date of Service: [name] 04/20/2022 2:00 PM Medical Record Number: [id number] Patient Account Number: [id number] Date of Birth/Sex: Treating RN: [birth year] (88 y.o. Elam Dutch Primary Care Nirvana Blanchett: SYSTEM, PRO Ave Filter Other Clinician: Referring Haywood Meinders: Treating Anaaya Fuster/Extender: Jadene Pierini in Treatment: 11 Active Problems Location of Pain Severity and Description of Pain Patient Has Paino No Site Locations Pain Management and Medication Current Pain Management: Electronic Signature(s) Signed: 04/20/2022 3:27:54 PM By: Sandre Kitty Signed: 04/25/2022 5:13:49 PM By: Baruch Gouty RN, BSN Entered By: Sandre Kitty on 04/20/2022 14:17:16 -------------------------------------------------------------------------------- Patient/Caregiver Education Details Patient Name: Date of Service: [name] 5/31/2023andnbsp2:00 PM Medical Record Number: [id number] Patient Account Number: [id number] Date of Birth/Gender: Treating RN:  1931/10/23 (88 y.o. America Angert Primary Care Physician: SYSTEM, PRO V IDER Other Clinician: Referring Physician: Treating Physician/Extender: Jadene Pierini in Treatment: 11 Education Assessment Education Provided To: Patient Education Topics Provided Wound/Skin Impairment: Methods: Explain/Verbal Responses: State content correctly Electronic Signature(s) Signed: 04/20/2022 5:13:17 PM By: Dellie Catholic RN Entered By: Dellie Catholic on 04/20/2022  14:49:59 -------------------------------------------------------------------------------- Wound Assessment Details Patient Name: Date of Service: [name] 04/20/2022 2:00 PM Medical Record Number: [id number] Patient Account Number: [id number] Date of Birth/Sex: Treating RN: 09/21/1931 (88 y.o. Elam Dutch Primary Care Jewelz Ricklefs: SYSTEM, PRO V IDER Other Clinician: Referring Tyrique Sporn: Treating Starlena Beil/Extender: Jadene Pierini in Treatment: 11 Wound Status Wound Number: 1 Primary Diabetic Wound/Ulcer of the Lower Extremity Etiology: Wound Location: Left T Great oe Wound Open Wounding Event: Gradually Appeared Status: Date Acquired: 09/29/2021 Comorbid Arrhythmia, Congestive Heart Failure, Hypertension, Type II Weeks Of Treatment: 11 History: Diabetes, Gout, Osteoarthritis Clustered Wound: No Photos Wound Measurements Length: (cm) 0.5 Width: (cm) 0.4 Depth: (cm) 0.2 Area: (cm) 0.157 Volume: (cm) 0.031 % Reduction in Area: 44.5% % Reduction in Volume: 45.6% Epithelialization: Small (1-33%) Tunneling: No Undermining: No Wound Description Classification: Grade 2 Wound Margin: Flat and Intact Exudate Amount: Medium Exudate Type: Serous Exudate Color: amber Foul Odor After Cleansing: No Slough/Fibrino Yes Wound Bed Granulation Amount: Small (1-33%) Exposed Structure Granulation Quality: Red Fascia Exposed: No Necrotic Amount: Large (67-100%) Fat Layer (Subcutaneous Tissue) Exposed: Yes Necrotic Quality: Adherent Slough Tendon Exposed: No Muscle Exposed: No Joint Exposed: No Bone Exposed: No Treatment Notes Wound #1 (Toe Great) Wound Laterality: Left Cleanser Wound Cleanser Discharge Instruction: Cleanse the wound with wound cleanser prior to applying a clean dressing using gauze sponges, not tissue or cotton balls. Peri-Wound Care Topical Primary Dressing Hydrofera Blue Ready Foam, 2.5 x2.5 in Discharge Instruction: Apply to wound  bed as instructed Secondary Dressing Woven Gauze Sponges 2x2 in Discharge Instruction: Apply over primary dressing as directed. Secured With Conforming Stretch Gauze Bandage, Sterile 2x75 (in/in) Discharge Instruction: Secure with stretch gauze as directed. Paper Tape, 1x10 (in/yd) Discharge Instruction: Secure dressing with tape as directed. Compression Wrap Compression Stockings Add-Ons Electronic Signature(s) Signed: 04/20/2022 5:13:17 PM By: Dellie Catholic RN Signed: 04/25/2022 5:13:49 PM By: Baruch Gouty RN, BSN Entered By: Dellie Catholic on 04/20/2022 14:46:05 -------------------------------------------------------------------------------- Vitals Details Patient Name: Date of Service: [name], [name] 04/20/2022 2:00 PM Medical Record Number: [id number] Patient Account Number: [id number] Date of Birth/Sex: Treating RN: Nov 01, 1931 (88 y.o. Elam Dutch Primary Care Richel Millspaugh: SYSTEM, PRO V IDER Other Clinician: Referring Daritza Brees: Treating Jayce Boyko/Extender: Jadene Pierini in Treatment: 11 Vital Signs Time Taken: 14:16 Temperature (F): 98.2 Pulse (bpm): 88 Respiratory Rate (breaths/min): 18 Blood Pressure (mmHg): 126/70 Reference Range: 80 - 120 mg / dl Electronic Signature(s) Signed: 04/20/2022 3:27:54 PM By: Sandre Kitty Entered By: Sandre Kitty on 04/20/2022 14:17:07

## 2022-04-25 NOTE — Progress Notes (Signed)
ABRAR, KOONE ([id number]) Visit Report for 04/20/2022 Chief Complaint Document Details Patient Name: Date of Service: [name], [name] 04/20/2022 2:00 PM Medical Record Number: [id number] Patient Account Number: [id number] Date of Birth/Sex: Treating RN: March 07, 1931 (88 y.o. [name] Primary Care Provider: SYSTEM, PRO Rodena Goldmann Other Clinician: Referring Provider: Treating Provider/Extender: [name] in Treatment: 11 Information Obtained from: Patient Chief Complaint Patient presents to the wound care center with open non-healing surgical wound(s) Electronic Signature(s) Signed: 04/20/2022 3:04:53 PM By: Duanne Guess MD FACS Entered By: Duanne Guess on 04/20/2022 15:04:53 -------------------------------------------------------------------------------- Debridement Details Patient Name: Date of Service: [name], [name]. 04/20/2022 2:00 PM Medical Record Number: [id number] Patient Account Number: [id number] Date of Birth/Sex: Treating RN: 1931-07-01 (88 y.o. [name] Primary Care Provider: SYSTEM, PRO V IDER Other Clinician: Referring Provider: Treating Provider/Extender: [name] in Treatment: 11 Debridement Performed for Assessment: Wound #1 Left T Great oe Performed By: Physician Duanne Guess, MD Debridement Type: Debridement Severity of Tissue Pre Debridement: Fat layer exposed Level of Consciousness (Pre-procedure): Awake and Alert Pre-procedure Verification/Time Out Yes - 14:56 Taken: Start Time: 14:56 Pain Control: Lidocaine 5% topical ointment T Area Debrided (L x W): otal 0.5 (cm) x 0.4 (cm) = 0.2 (cm) Tissue and other material debrided: Viable, Non-Viable, Slough, Subcutaneous, Slough Level: Skin/Subcutaneous Tissue Debridement Description: Excisional Instrument: Curette Bleeding: Minimum Hemostasis Achieved: Pressure Procedural Pain: 2 Post Procedural Pain: 0 Response to Treatment: Procedure was  tolerated well Level of Consciousness (Post- Awake and Alert procedure): Post Debridement Measurements of Total Wound Length: (cm) 0.5 Width: (cm) 0.4 Depth: (cm) 0.2 Volume: (cm) 0.031 Character of Wound/Ulcer Post Debridement: Improved Severity of Tissue Post Debridement: Fat layer exposed Post Procedure Diagnosis Same as Pre-procedure Electronic Signature(s) Signed: 04/20/2022 5:02:34 PM By: Duanne Guess MD FACS Signed: 04/20/2022 5:13:17 PM By: Karie Schwalbe RN Entered By: Karie Schwalbe on 04/20/2022 14:57:57 -------------------------------------------------------------------------------- HPI Details Patient Name: Date of Service: [name], [name]. 04/20/2022 2:00 PM Medical Record Number: [id number] Patient Account Number: [id number] Date of Birth/Sex: Treating RN: 1931-10-29 (88 y.o. [name] Primary Care Provider: SYSTEM, PRO Rodena Goldmann Other Clinician: Referring Provider: Treating Provider/Extender: [name] in Treatment: 11 History of Present Illness HPI Description: ADMISSION 02/01/2022 This is an 88 year old woman who is a resident of [facility]. She has a past medical history notable for type 2 diabetes mellitus, congestive heart failure, pulmonary hypertension, and complete heart block. She is accompanied today by her son. Approximately 4 months ago, she had an ingrown toe nail removed. Apparently this went a bit deep. Since that time, it has not healed. The patient is nonambulatory. Apparently they have been applying Santyl to the site at her facility. She was on antibiotics but I am not certain which specific drugs she took. She completed her last course a few weeks ago. We were unable to obtain ABIs in clinic secondary to the patient's body habitus. She has not had any formal vascular studies nor any x-ray of the area. 02/09/2022: The wound is roughly the same size today, but the amount of slough in the base has decreased significantly. She  apparently had an x-ray done at her facility, but we do not have the report; per verbal report from facility staff, "there was nothing there." We had requested formal ABI/TBI studies, but these were not done. She has been in [place] under [facility]	[place]. 02/16/2022: The wound measured about the same today, but to my eye, it appears rather smaller.  The slough in the base is minimal. Her vascular studies are scheduled for Friday. We have been using Santyl under Hydrofera Blue. No concern for infection. 02/23/2022: Once again, the wound has been measured about the same size, but I think it looks a bit smaller. There is slough at the base. She had her ABIs done last week and these show severe bilateral peripheral arterial disease. We received the report of the foot x-ray that was ordered. There is no concern for osteomyelitis at this time. 03/02/2022: The wound is smaller today with a small amount of slough at the base. We placed a referral to vein and vascular surgery last week in response to her severe peripheral arterial disease, but she does not yet have an appointment on the books. 03/16/2022: She saw Dr. Lenell Antu in the vascular surgery department yesterday. He did not think she was a good candidate for any revascularization options and suggested that she continue local wound care for another month. If she fails to heal in that time, they may consider an angiogram with potential intervention. The wound today is roughly the same size but there is less exposed bone at the base and the surface is clean without any substantial slough. 03/24/2022: The wound appears about the same. There is some periwound callus that has heaped up. We changed her to Prisma silver collagen last week. 04/06/2022: No significant change to the wound. It is neither improving nor deteriorating. She continues to accumulate some periwound callus around the margin. Bone is exposed at the base. 04/20/2022: The wound has improved quite a bit  over the past 2 weeks. It is smaller and there is no real accumulation of periwound callus. She had follow-up with Dr. Lenell Antu yesterday and he felt that the wound was progressing well enough that no arterial intervention was necessary. Electronic Signature(s) Signed: 04/20/2022 3:05:39 PM By: Duanne Guess MD FACS Entered By: Duanne Guess on 04/20/2022 15:05:39 -------------------------------------------------------------------------------- Physical Exam Details Patient Name: Date of Service: [name]. 04/20/2022 2:00 PM Medical Record Number: [id number] Patient Account Number: [id number] Date of Birth/Sex: Treating RN: 09-18-1931 (88 y.o. [name] Primary Care Provider: SYSTEM, PRO Rodena Goldmann Other Clinician: Referring Provider: Treating Provider/Extender: [name] in Treatment: 11 Constitutional . . . . No acute distress. Respiratory Normal work of breathing on room air. Notes 04/20/2022: The wound is quite a bit smaller today. There is still bone exposed at the base, but everything is fairly clean with just a small amount of slough. No periwound callus accumulation. Electronic Signature(s) Signed: 04/20/2022 3:06:33 PM By: Duanne Guess MD FACS Entered By: Duanne Guess on 04/20/2022 15:06:32 -------------------------------------------------------------------------------- Physician Orders Details Patient Name: Date of Service: [name], [name]. 04/20/2022 2:00 PM Medical Record Number: [id number] Patient Account Number: [id number] Date of Birth/Sex: Treating RN: [birth year] (88 y.o. [name] Primary Care Provider: SYSTEM, PRO V IDER Other Clinician: Referring Provider: Treating Provider/Extender: [name] in Treatment: 11 Verbal / Phone Orders: No Diagnosis Coding ICD-10 Coding Code Description E11.621 Type 2 diabetes mellitus with foot ulcer L97.522 Non-pressure chronic ulcer of other part of left foot with fat  layer exposed I50.32 Chronic diastolic (congestive) heart failure I27.20 Pulmonary hypertension, unspecified Follow-up Appointments ppointment in 2 weeks. - Dr. Lady Gary - Room 1 Return A Wednesday 05/04/22 @ 2:00pm Bathing/ Shower/ Hygiene May shower with protection but do not get wound dressing(s) wet. Off-Loading Other: - No direct pressure on toes Wound Treatment Wound #1 - T Great oe Wound Laterality:  Left Cleanser: Wound Cleanser Every Other Day/30 Days Discharge Instructions: Cleanse the wound with wound cleanser prior to applying a clean dressing using gauze sponges, not tissue or cotton balls. Prim Dressing: Hydrofera Blue Ready Foam, 2.5 x2.5 in Every Other Day/30 Days ary Discharge Instructions: Apply to wound bed as instructed Secondary Dressing: Woven Gauze Sponges 2x2 in Every Other Day/30 Days Discharge Instructions: Apply over primary dressing as directed. Secured With: Insurance underwriter, Sterile 2x75 (in/in) Every Other Day/30 Days Discharge Instructions: Secure with stretch gauze as directed. Secured With: Paper Tape, 1x10 (in/yd) Every Other Day/30 Days Discharge Instructions: Secure dressing with tape as directed. Electronic Signature(s) Signed: 04/20/2022 5:02:34 PM By: Duanne Guess MD FACS Entered By: Duanne Guess on 04/20/2022 15:06:44 -------------------------------------------------------------------------------- Problem List Details Patient Name: Date of Service: [name], [name]. 04/20/2022 2:00 PM Medical Record Number: [id number] Patient Account Number: [id number] Date of Birth/Sex: Treating RN: 03/22/1931 (88 y.o. [name] Primary Care Provider: SYSTEM, PRO Rodena Goldmann Other Clinician: Referring Provider: Treating Provider/Extender: [name] in Treatment: 11 Active Problems ICD-10 Encounter Code Description Active Date MDM Diagnosis E11.621 Type 2 diabetes mellitus with foot ulcer 02/01/2022 No  Yes L97.522 Non-pressure chronic ulcer of other part of left foot with fat layer exposed 02/01/2022 No Yes I50.32 Chronic diastolic (congestive) heart failure 02/01/2022 No Yes I27.20 Pulmonary hypertension, unspecified 02/01/2022 No Yes Inactive Problems Resolved Problems Electronic Signature(s) Signed: 04/20/2022 3:04:02 PM By: Duanne Guess MD FACS Entered By: Duanne Guess on 04/20/2022 15:04:02 -------------------------------------------------------------------------------- Progress Note Details Patient Name: Date of Service: [name], [name]. 04/20/2022 2:00 PM Medical Record Number: [id number] Patient Account Number: [id number] Date of Birth/Sex: Treating RN: 1931/03/30 (88 y.o. [name] Primary Care Provider: SYSTEM, PRO Rodena Goldmann Other Clinician: Referring Provider: Treating Provider/Extender: [name] in Treatment: 11 Subjective Chief Complaint Information obtained from Patient Patient presents to the wound care center with open non-healing surgical wound(s) History of Present Illness (HPI) ADMISSION 02/01/2022 This is an 88 year old woman who is a resident of [place]. She has a past medical history notable for type 2 diabetes mellitus, congestive heart failure, pulmonary hypertension, and complete heart block. She is accompanied today by her son. Approximately 4 months ago, she had an ingrown toe nail removed. Apparently this went a bit deep. Since that time, it has not healed. The patient is nonambulatory. Apparently they have been applying Santyl to the site at her facility. She was on antibiotics but I am not certain which specific drugs she took. She completed her last course a few weeks ago. We were unable to obtain ABIs in clinic secondary to the patient's body habitus. She has not had any formal vascular studies nor any x-ray of the area. 02/09/2022: The wound is roughly the same size today, but the amount of slough in the base has  decreased significantly. She apparently had an x-ray done at her facility, but we do not have the report; per verbal report from facility staff, "there was nothing there." We had requested formal ABI/TBI studies, but these were not done. She has been in [place] under [facility]	[place]. 02/16/2022: The wound measured about the same today, but to my eye, it appears rather smaller. The slough in the base is minimal. Her vascular studies are scheduled for Friday. We have been using Santyl under Hydrofera Blue. No concern for infection. 02/23/2022: Once again, the wound has been measured about the same size, but I think it looks a bit smaller. There is  slough at the base. She had her ABIs done last week and these show severe bilateral peripheral arterial disease. We received the report of the foot x-ray that was ordered. There is no concern for osteomyelitis at this time. 03/02/2022: The wound is smaller today with a small amount of slough at the base. We placed a referral to vein and vascular surgery last week in response to her severe peripheral arterial disease, but she does not yet have an appointment on the books. 03/16/2022: She saw Dr. Lenell Antu in the vascular surgery department yesterday. He did not think she was a good candidate for any revascularization options and suggested that she continue local wound care for another month. If she fails to heal in that time, they may consider an angiogram with potential intervention. The wound today is roughly the same size but there is less exposed bone at the base and the surface is clean without any substantial slough. 03/24/2022: The wound appears about the same. There is some periwound callus that has heaped up. We changed her to Prisma silver collagen last week. 04/06/2022: No significant change to the wound. It is neither improving nor deteriorating. She continues to accumulate some periwound callus around the margin. Bone is exposed at the base. 04/20/2022: The  wound has improved quite a bit over the past 2 weeks. It is smaller and there is no real accumulation of periwound callus. She had follow-up with Dr. Lenell Antu yesterday and he felt that the wound was progressing well enough that no arterial intervention was necessary. Patient History Social History Never smoker, Marital Status - Widowed, Alcohol Use - Never, Drug Use - No History, Caffeine Use - Rarely. Medical History Cardiovascular Patient has history of Arrhythmia - AV block, complete heart block, Congestive Heart Failure, Hypertension Endocrine Patient has history of Type II Diabetes Musculoskeletal Patient has history of Gout, Osteoarthritis Medical A Surgical History Notes nd Cardiovascular Pulmonary Hypertension Endocrine Hypoglycemia Neurologic Acute encephalopathy Objective Constitutional No acute distress. Vitals Time Taken: 2:16 PM, Temperature: 98.2 F, Pulse: 88 bpm, Respiratory Rate: 18 breaths/min, Blood Pressure: 126/70 mmHg. Respiratory Normal work of breathing on room air. General Notes: 04/20/2022: The wound is quite a bit smaller today. There is still bone exposed at the base, but everything is fairly clean with just a small amount of slough. No periwound callus accumulation. Integumentary (Hair, Skin) Wound #1 status is Open. Original cause of wound was Gradually Appeared. The date acquired was: 09/29/2021. The wound has been in treatment 11 weeks. The wound is located on the Left T Great. The wound measures 0.5cm length x 0.4cm width x 0.2cm depth; 0.157cm^2 area and 0.031cm^3 volume. There is Fat oe Layer (Subcutaneous Tissue) exposed. There is no tunneling or undermining noted. There is a medium amount of serous drainage noted. The wound margin is flat and intact. There is small (1-33%) red granulation within the wound bed. There is a large (67-100%) amount of necrotic tissue within the wound bed including Adherent Slough. Assessment Active  Problems ICD-10 Type 2 diabetes mellitus with foot ulcer Non-pressure chronic ulcer of other part of left foot with fat layer exposed Chronic diastolic (congestive) heart failure Pulmonary hypertension, unspecified Procedures Wound #1 Pre-procedure diagnosis of Wound #1 is a Diabetic Wound/Ulcer of the Lower Extremity located on the Left T Great .Severity of Tissue Pre Debridement is: oe Fat layer exposed. There was a Excisional Skin/Subcutaneous Tissue Debridement with a total area of 0.2 sq cm performed by Duanne Guess, MD. With the following instrument(s):  Curette to remove Viable and Non-Viable tissue/material. Material removed includes Subcutaneous Tissue and Slough and after achieving pain control using Lidocaine 5% topical ointment. No specimens were taken. A time out was conducted at 14:56, prior to the start of the procedure. A Minimum amount of bleeding was controlled with Pressure. The procedure was tolerated well with a pain level of 2 throughout and a pain level of 0 following the procedure. Post Debridement Measurements: 0.5cm length x 0.4cm width x 0.2cm depth; 0.031cm^3 volume. Character of Wound/Ulcer Post Debridement is improved. Severity of Tissue Post Debridement is: Fat layer exposed. Post procedure Diagnosis Wound #1: Same as Pre-Procedure Plan Follow-up Appointments: Return Appointment in 2 weeks. - Dr. Lady Gary - Room 1 Wednesday 05/04/22 @ 2:00pm Bathing/ Shower/ Hygiene: May shower with protection but do not get wound dressing(s) wet. Off-Loading: Other: - No direct pressure on toes WOUND #1: - T Great Wound Laterality: Left oe Cleanser: Wound Cleanser Every Other Day/30 Days Discharge Instructions: Cleanse the wound with wound cleanser prior to applying a clean dressing using gauze sponges, not tissue or cotton balls. Prim Dressing: Hydrofera Blue Ready Foam, 2.5 x2.5 in Every Other Day/30 Days ary Discharge Instructions: Apply to wound bed as  instructed Secondary Dressing: Woven Gauze Sponges 2x2 in Every Other Day/30 Days Discharge Instructions: Apply over primary dressing as directed. Secured With: Insurance underwriter, Sterile 2x75 (in/in) Every Other Day/30 Days Discharge Instructions: Secure with stretch gauze as directed. Secured With: Paper T ape, 1x10 (in/yd) Every Other Day/30 Days Discharge Instructions: Secure dressing with tape as directed. 04/20/2022: The wound is quite a bit smaller today. There is still bone exposed at the base, but everything is fairly clean with just a small amount of slough. No periwound callus accumulation. I debrided a small bit of residual Hydrofera Blue as well as slough from the wound. The surface is clean with a little bit of bone exposure. We have made good progress over the past 2 weeks and I will continue using the [facility]. Follow-up in 2 weeks. Electronic Signature(s) Signed: 04/20/2022 3:07:24 PM By: Duanne Guess MD FACS Entered By: Duanne Guess on 04/20/2022 15:07:24 -------------------------------------------------------------------------------- HxROS Details Patient Name: Date of Service: [name], [name] Croft S. 04/20/2022 2:00 PM Medical Record Number: [id number] Patient Account Number: [id number] Date of Birth/Sex: Treating RN: 1931/02/24 (88 y.o. [name] Primary Care Provider: SYSTEM, PRO Rodena Goldmann Other Clinician: Referring Provider: Treating Provider/Extender: [name] in Treatment: 11 Cardiovascular Medical History: Positive for: Arrhythmia - AV block, complete heart block; Congestive Heart Failure; Hypertension Past Medical History Notes: Pulmonary Hypertension Endocrine Medical History: Positive for: Type II Diabetes Past Medical History Notes: Hypoglycemia Treated with: Oral agents Blood sugar tested every day: Yes Tested : 2-3x a day Musculoskeletal Medical History: Positive for: Gout;  Osteoarthritis Neurologic Medical History: Past Medical History Notes: Acute encephalopathy Immunizations Pneumococcal Vaccine: Received Pneumococcal Vaccination: No Implantable Devices No devices added Family and Social History Never smoker; Marital Status - Widowed; Alcohol Use: Never; Drug Use: No History; Caffeine Use: Rarely; Financial Concerns: No; Food, Clothing or Shelter Needs: No; Support System Lacking: No; Transportation Concerns: No Psychologist, prison and probation services) Signed: 04/20/2022 5:02:34 PM By: Duanne Guess MD FACS Signed: 04/25/2022 5:13:49 PM By: Zenaida Deed RN, BSN Entered By: Duanne Guess on 04/20/2022 15:05:44 -------------------------------------------------------------------------------- SuperBill Details Patient Name: Date of Service: [name] 04/20/2022 Medical Record Number: [id number] Patient Account Number: [id number] Date of Birth/Sex: Treating RN: [birth year] (88 y.o. [name] Primary Care Provider: SYSTEM,  PRO V IDER Other Clinician: Referring Provider: Treating Provider/Extender: [name] in Treatment: 11 Diagnosis Coding ICD-10 Codes Code Description E11.621 Type 2 diabetes mellitus with foot ulcer L97.522 Non-pressure chronic ulcer of other part of left foot with fat layer exposed I50.32 Chronic diastolic (congestive) heart failure I27.20 Pulmonary hypertension, unspecified Facility Procedures CPT4 Code: [id number] Description: 11042 - DEB SUBQ TISSUE 20 SQ CM/< ICD-10 Diagnosis Description L97.522 Non-pressure chronic ulcer of other part of left foot with fat layer exposed E11.621 Type 2 diabetes mellitus with foot ulcer Modifier: Quantity: 1 Physician Procedures : CPT4 Code Description Modifier [id number] - WC PHYS LEVEL 3 - EST PT 25 ICD-10 Diagnosis Description L97.522 Non-pressure chronic ulcer of other part of left foot with fat layer exposed E11.621 Type 2 diabetes mellitus with foot ulcer I27.20   Pulmonary hypertension, unspecified Quantity: 1 : [id number] 11042 - WC PHYS SUBQ TISS 20 SQ CM ICD-10 Diagnosis Description L97.522 Non-pressure chronic ulcer of other part of left foot with fat layer exposed E11.621 Type 2 diabetes mellitus with foot ulcer Quantity: 1 Electronic Signature(s) Signed: 04/20/2022 3:27:23 PM By: Duanne Guess MD FACS Entered By: Duanne Guess on 04/20/2022 15:27:22

## 2022-04-27 NOTE — Progress Notes (Signed)
Remote pacemaker transmission.   

## 2022-05-04 ENCOUNTER — Ambulatory Visit (HOSPITAL_BASED_OUTPATIENT_CLINIC_OR_DEPARTMENT_OTHER): Payer: Medicare Other | Admitting: General Surgery

## 2022-05-18 ENCOUNTER — Encounter (HOSPITAL_BASED_OUTPATIENT_CLINIC_OR_DEPARTMENT_OTHER): Payer: Medicare Other | Attending: General Surgery | Admitting: General Surgery

## 2022-05-18 DIAGNOSIS — E11621 Type 2 diabetes mellitus with foot ulcer: Secondary | ICD-10-CM | POA: Insufficient documentation

## 2022-05-18 DIAGNOSIS — I272 Pulmonary hypertension, unspecified: Secondary | ICD-10-CM | POA: Diagnosis not present

## 2022-05-18 DIAGNOSIS — I5032 Chronic diastolic (congestive) heart failure: Secondary | ICD-10-CM | POA: Diagnosis not present

## 2022-05-18 DIAGNOSIS — T8189XA Other complications of procedures, not elsewhere classified, initial encounter: Secondary | ICD-10-CM | POA: Insufficient documentation

## 2022-05-18 DIAGNOSIS — I110 Hypertensive heart disease with heart failure: Secondary | ICD-10-CM | POA: Insufficient documentation

## 2022-05-18 DIAGNOSIS — L97522 Non-pressure chronic ulcer of other part of left foot with fat layer exposed: Secondary | ICD-10-CM | POA: Diagnosis not present

## 2022-05-18 DIAGNOSIS — Y838 Other surgical procedures as the cause of abnormal reaction of the patient, or of later complication, without mention of misadventure at the time of the procedure: Secondary | ICD-10-CM | POA: Insufficient documentation

## 2022-05-18 DIAGNOSIS — I459 Conduction disorder, unspecified: Secondary | ICD-10-CM | POA: Diagnosis not present

## 2022-06-01 ENCOUNTER — Encounter (HOSPITAL_BASED_OUTPATIENT_CLINIC_OR_DEPARTMENT_OTHER): Payer: Medicare Other | Attending: General Surgery | Admitting: General Surgery

## 2022-06-01 DIAGNOSIS — I110 Hypertensive heart disease with heart failure: Secondary | ICD-10-CM | POA: Diagnosis not present

## 2022-06-01 DIAGNOSIS — L97522 Non-pressure chronic ulcer of other part of left foot with fat layer exposed: Secondary | ICD-10-CM | POA: Diagnosis not present

## 2022-06-01 DIAGNOSIS — I5032 Chronic diastolic (congestive) heart failure: Secondary | ICD-10-CM | POA: Diagnosis not present

## 2022-06-01 DIAGNOSIS — I272 Pulmonary hypertension, unspecified: Secondary | ICD-10-CM | POA: Diagnosis not present

## 2022-06-01 DIAGNOSIS — E11621 Type 2 diabetes mellitus with foot ulcer: Secondary | ICD-10-CM | POA: Insufficient documentation

## 2022-06-01 NOTE — Progress Notes (Signed)
[name], [name] ([id number]) Visit Report for 06/01/2022 Chief Complaint Document Details Patient Name: Date of Service: [name], [name] 06/01/2022 1:15 PM Medical Record Number: [id number] Patient Account Number: [id number] Date of Birth/Sex: Treating RN: 07/15/1931 (88 y.o. [name] Primary Care Provider: SYSTEM, PRO Rodena Goldmann Other Clinician: Referring Provider: Treating Provider/Extender: [name] in Treatment: 17 Information Obtained from: Patient Chief Complaint Patient presents to the wound care center with open non-healing surgical wound(s) Electronic Signature(s) Signed: 06/01/2022 2:03:21 PM By: Duanne Guess MD FACS Entered By: Duanne Guess on 06/01/2022 14:03:21 -------------------------------------------------------------------------------- Debridement Details Patient Name: Date of Service: [name]. 06/01/2022 1:15 PM Medical Record Number: [id number] Patient Account Number: [id number] Date of Birth/Sex: Treating RN: 13-Oct-1931 (88 y.o. [name] Primary Care Provider: SYSTEM, PRO V IDER Other Clinician: Referring Provider: Treating Provider/Extender: [name] in Treatment: 17 Debridement Performed for Assessment: Wound #1 Left T Great oe Performed By: Physician Duanne Guess, MD Debridement Type: Debridement Severity of Tissue Pre Debridement: Fat layer exposed Level of Consciousness (Pre-procedure): Awake and Alert Pre-procedure Verification/Time Out Yes - 13:53 Taken: Start Time: 13:53 Pain Control: Lidocaine 5% topical ointment T Area Debrided (L x W): otal 0.4 (cm) x 0.4 (cm) = 0.16 (cm) Tissue and other material debrided: Non-Viable, Callus, Slough, Slough Level: Non-Viable Tissue Debridement Description: Selective/Open Wound Instrument: Curette Bleeding: Minimum Hemostasis Achieved: Pressure End Time: 13:54 Procedural Pain: 0 Post Procedural Pain: 0 Response to Treatment: Procedure was  tolerated well Level of Consciousness (Post- Awake and Alert procedure): Post Debridement Measurements of Total Wound Length: (cm) 0.4 Width: (cm) 0.4 Depth: (cm) 0.2 Volume: (cm) 0.025 Character of Wound/Ulcer Post Debridement: Improved Severity of Tissue Post Debridement: Fat layer exposed Post Procedure Diagnosis Same as Pre-procedure Electronic Signature(s) Signed: 06/01/2022 2:51:56 PM By: Duanne Guess MD FACS Signed: 06/01/2022 5:36:06 PM By: Karie Schwalbe RN Entered By: Karie Schwalbe on 06/01/2022 13:57:36 -------------------------------------------------------------------------------- HPI Details Patient Name: Date of Service: [name], [name]. 06/01/2022 1:15 PM Medical Record Number: [id number] Patient Account Number: [id number] Date of Birth/Sex: Treating RN: 16-Jan-1931 (88 y.o. [name] Primary Care Provider: SYSTEM, PRO Rodena Goldmann Other Clinician: Referring Provider: Treating Provider/Extender: [name] in Treatment: 17 History of Present Illness HPI Description: ADMISSION 02/01/2022 This is an 88 year old woman who is a resident of [place]. She has a past medical history notable for type 2 diabetes mellitus, congestive heart failure, pulmonary hypertension, and complete heart block. She is accompanied today by her son. Approximately 4 months ago, she had an ingrown toe nail removed. Apparently this went a bit deep. Since that time, it has not healed. The patient is nonambulatory. Apparently they have been applying Santyl to the site at her facility. She was on antibiotics but I am not certain which specific drugs she took. She completed her last course a few weeks ago. We were unable to obtain ABIs in clinic secondary to the patient's body habitus. She has not had any formal vascular studies nor any x-ray of the area. 02/09/2022: The wound is roughly the same size today, but the amount of slough in the base has decreased significantly. She  apparently had an x-ray done at her facility, but we do not have the report; per verbal report from facility staff, "there was nothing there." We had requested formal ABI/TBI studies, but these were not done. She has been in [place] under [facility]	[place]. 02/16/2022: The wound measured about the same today, but to my eye, it  appears rather smaller. The slough in the base is minimal. Her vascular studies are scheduled for Friday. We have been using Santyl under Hydrofera Blue. No concern for infection. 02/23/2022: Once again, the wound has been measured about the same size, but I think it looks a bit smaller. There is slough at the base. She had her ABIs done last week and these show severe bilateral peripheral arterial disease. We received the report of the foot x-ray that was ordered. There is no concern for osteomyelitis at this time. 03/02/2022: The wound is smaller today with a small amount of slough at the base. We placed a referral to vein and vascular surgery last week in response to her severe peripheral arterial disease, but she does not yet have an appointment on the books. 03/16/2022: She saw Dr. Lenell Antu in the vascular surgery department yesterday. He did not think she was a good candidate for any revascularization options and suggested that she continue local wound care for another month. If she fails to heal in that time, they may consider an angiogram with potential intervention. The wound today is roughly the same size but there is less exposed bone at the base and the surface is clean without any substantial slough. 03/24/2022: The wound appears about the same. There is some periwound callus that has heaped up. We changed her to Prisma silver collagen last week. 04/06/2022: No significant change to the wound. It is neither improving nor deteriorating. She continues to accumulate some periwound callus around the margin. Bone is exposed at the base. 04/20/2022: The wound has improved quite a bit  over the past 2 weeks. It is smaller and there is no real accumulation of periwound callus. She had follow-up with Dr. Lenell Antu yesterday and he felt that the wound was progressing well enough that no arterial intervention was necessary. 05/18/2022: The patient has been absent due to a fall from her wheelchair during her last transportation that was supposed to be to her clinic. Today, the wound is about the same size. There is still bone exposed at the base. There is periwound callus accumulation. No concern for infection. 06/01/2022: The wound is actually a little bit smaller today and the surface is not as pale. She still has accumulated periwound callus. Electronic Signature(s) Signed: 06/01/2022 2:05:51 PM By: Duanne Guess MD FACS Entered By: Duanne Guess on 06/01/2022 14:05:51 -------------------------------------------------------------------------------- Physical Exam Details Patient Name: Date of Service: [name]. 06/01/2022 1:15 PM Medical Record Number: [id number] Patient Account Number: [id number] Date of Birth/Sex: Treating RN: [birth year] (88 y.o. [name] Primary Care Provider: SYSTEM, PRO Rodena Goldmann Other Clinician: Referring Provider: Treating Provider/Extender: [name] in Treatment: 17 Constitutional . . . . No acute distress.[name] Respiratory Normal work of breathing on room air.. Notes 06/01/2022: The wound is actually a little bit smaller today and the surface is not as pale. She still has accumulated periwound callus. Electronic Signature(s) Signed: 06/01/2022 2:06:18 PM By: Duanne Guess MD FACS Entered By: Duanne Guess on 06/01/2022 14:06:17 -------------------------------------------------------------------------------- Physician Orders Details Patient Name: Date of Service: [name], [name] 06/01/2022 1:15 PM Medical Record Number: [id number] Patient Account Number: [id number] Date of Birth/Sex: Treating RN: August 13, 1931 (88  y.o. [name] Primary Care Provider: SYSTEM, PRO Rodena Goldmann Other Clinician: Referring Provider: Treating Provider/Extender: [name] in Treatment: 17 Verbal / Phone Orders: No Diagnosis Coding ICD-10 Coding Code Description E11.621 Type 2 diabetes mellitus with foot ulcer L97.522 Non-pressure chronic ulcer of other part of  left foot with fat layer exposed I50.32 Chronic diastolic (congestive) heart failure I27.20 Pulmonary hypertension, unspecified Follow-up Appointments ppointment in 2 weeks. - Dr. Lady Gary - Room 3 Wednesday July 26th at 1:15 pm Return A Bathing/ Shower/ Hygiene May shower with protection but do not get wound dressing(s) wet. Off-Loading Other: - No direct pressure on toes Wound Treatment Wound #1 - T Great oe Wound Laterality: Left Cleanser: Wound Cleanser Every Other Day/30 Days Discharge Instructions: Cleanse the wound with wound cleanser prior to applying a clean dressing using gauze sponges, not tissue or cotton balls. Topical: Gentamicin Every Other Day/30 Days Discharge Instructions: As directed by physician Prim Dressing: KerraCel Ag Gelling Fiber Dressing, 2x2 in (silver alginate) ary Every Other Day/30 Days Discharge Instructions: Apply silver alginate to wound bed as instructed Secondary Dressing: Optifoam Non-Adhesive Dressing, 4x4 in Every Other Day/30 Days Discharge Instructions: Apply between 1st and 2nd toes on left foot Secondary Dressing: Woven Gauze Sponges 2x2 in Every Other Day/30 Days Discharge Instructions: Apply over primary dressing as directed. Secured With: Insurance underwriter, Sterile 2x75 (in/in) Every Other Day/30 Days Discharge Instructions: Secure with stretch gauze as directed. Secured With: Paper Tape, 1x10 (in/yd) Every Other Day/30 Days Discharge Instructions: Secure dressing with tape as directed. Electronic Signature(s) Signed: 06/01/2022 2:51:56 PM By: Duanne Guess MD FACS Entered By:  Duanne Guess on 06/01/2022 14:06:30 -------------------------------------------------------------------------------- Problem List Details Patient Name: Date of Service: [name], [name]. 06/01/2022 1:15 PM Medical Record Number: [id number] Patient Account Number: [id number] Date of Birth/Sex: Treating RN: 1931/07/13 (88 y.o. [name] Primary Care Provider: SYSTEM, PRO Rodena Goldmann Other Clinician: Referring Provider: Treating Provider/Extender: [name] in Treatment: 17 Active Problems ICD-10 Encounter Code Description Active Date MDM Diagnosis E11.621 Type 2 diabetes mellitus with foot ulcer 02/01/2022 No Yes L97.522 Non-pressure chronic ulcer of other part of left foot with fat layer exposed 02/01/2022 No Yes I50.32 Chronic diastolic (congestive) heart failure 02/01/2022 No Yes I27.20 Pulmonary hypertension, unspecified 02/01/2022 No Yes Inactive Problems Resolved Problems Electronic Signature(s) Signed: 06/01/2022 2:02:30 PM By: Duanne Guess MD FACS Entered By: Duanne Guess on 06/01/2022 14:02:30 -------------------------------------------------------------------------------- Progress Note Details Patient Name: Date of Service: [name]. 06/01/2022 1:15 PM Medical Record Number: [id number] Patient Account Number: [id number] Date of Birth/Sex: Treating RN: May 31, 1931 (88 y.o. [name] Primary Care Provider: SYSTEM, PRO Rodena Goldmann Other Clinician: Referring Provider: Treating Provider/Extender: [name] in Treatment: 17 Subjective Chief Complaint Information obtained from Patient Patient presents to the wound care center with open non-healing surgical wound(s) History of Present Illness (HPI) ADMISSION 02/01/2022 This is an 88 year old woman who is a resident of [place]. She has a past medical history notable for type 2 diabetes mellitus, congestive heart failure, pulmonary hypertension, and complete heart block.  She is accompanied today by her son. Approximately 4 months ago, she had an ingrown toe nail removed. Apparently this went a bit deep. Since that time, it has not healed. The patient is nonambulatory. Apparently they have been applying Santyl to the site at her facility. She was on antibiotics but I am not certain which specific drugs she took. She completed her last course a few weeks ago. We were unable to obtain ABIs in clinic secondary to the patient's body habitus. She has not had any formal vascular studies nor any x-ray of the area. 02/09/2022: The wound is roughly the same size today, but the amount of slough in the base has decreased significantly. She apparently had  an x-ray done at her facility, but we do not have the report; per verbal report from facility staff, "there was nothing there." We had requested formal ABI/TBI studies, but these were not done. She has been in [place] under [facility]	[place]. 02/16/2022: The wound measured about the same today, but to my eye, it appears rather smaller. The slough in the base is minimal. Her vascular studies are scheduled for Friday. We have been using Santyl under Hydrofera Blue. No concern for infection. 02/23/2022: Once again, the wound has been measured about the same size, but I think it looks a bit smaller. There is slough at the base. She had her ABIs done last week and these show severe bilateral peripheral arterial disease. We received the report of the foot x-ray that was ordered. There is no concern for osteomyelitis at this time. 03/02/2022: The wound is smaller today with a small amount of slough at the base. We placed a referral to vein and vascular surgery last week in response to her severe peripheral arterial disease, but she does not yet have an appointment on the books. 03/16/2022: She saw Dr. Lenell Antu in the vascular surgery department yesterday. He did not think she was a good candidate for any revascularization options and suggested that  she continue local wound care for another month. If she fails to heal in that time, they may consider an angiogram with potential intervention. The wound today is roughly the same size but there is less exposed bone at the base and the surface is clean without any substantial slough. 03/24/2022: The wound appears about the same. There is some periwound callus that has heaped up. We changed her to Prisma silver collagen last week. 04/06/2022: No significant change to the wound. It is neither improving nor deteriorating. She continues to accumulate some periwound callus around the margin. Bone is exposed at the base. 04/20/2022: The wound has improved quite a bit over the past 2 weeks. It is smaller and there is no real accumulation of periwound callus. She had follow-up with Dr. Lenell Antu yesterday and he felt that the wound was progressing well enough that no arterial intervention was necessary. 05/18/2022: The patient has been absent due to a fall from her wheelchair during her last transportation that was supposed to be to her clinic. Today, the wound is about the same size. There is still bone exposed at the base. There is periwound callus accumulation. No concern for infection. 06/01/2022: The wound is actually a little bit smaller today and the surface is not as pale. She still has accumulated periwound callus. Patient History Social History Never smoker, Marital Status - Widowed, Alcohol Use - Never, Drug Use - No History, Caffeine Use - Rarely. Medical History Cardiovascular Patient has history of Arrhythmia - AV block, complete heart block, Congestive Heart Failure, Hypertension Endocrine Patient has history of Type II Diabetes Musculoskeletal Patient has history of Gout, Osteoarthritis Medical A Surgical History Notes nd Cardiovascular Pulmonary Hypertension Endocrine Hypoglycemia Neurologic Acute encephalopathy Objective Constitutional No acute distress.. Vitals Time Taken: 1:42 PM,  Temperature: 98.5 F, Pulse: 85 bpm, Respiratory Rate: 18 breaths/min, Blood Pressure: 128/75 mmHg. Respiratory Normal work of breathing on room air.. General Notes: 06/01/2022: The wound is actually a little bit smaller today and the surface is not as pale. She still has accumulated periwound callus. Integumentary (Hair, Skin) Wound #1 status is Open. Original cause of wound was Gradually Appeared. The date acquired was: 09/29/2021. The wound has been in treatment 17 weeks. The  wound is located on the Left T Great. The wound measures 0.4cm length x 0.4cm width x 0.2cm depth; 0.126cm^2 area and 0.025cm^3 volume. There is Fat oe Layer (Subcutaneous Tissue) exposed. There is no tunneling or undermining noted. There is a medium amount of serous drainage noted. The wound margin is flat and intact. There is large (67-100%) pink granulation within the wound bed. There is a small (1-33%) amount of necrotic tissue within the wound bed including Adherent Slough. 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 There was a Selective/Open Wound Non-Viable Tissue Debridement with a total area of 0.16 sq cm performed by Duanne Guess, MD. With the following instrument(s): Curette to remove Non-Viable tissue/material. Material removed includes Callus and Slough and after achieving pain control using Lidocaine 5% topical ointment. No specimens were taken. A time out was conducted at 13:53, prior to the start of the procedure. A Minimum amount of bleeding was controlled with Pressure. The procedure was tolerated well with a pain  level of 0 throughout and a pain level of 0 following the procedure. Post Debridement Measurements: 0.4cm length x 0.4cm width x 0.2cm depth; 0.025cm^3 volume. Character of Wound/Ulcer Post Debridement is improved. Severity of Tissue Post Debridement is: Fat layer exposed. Post procedure Diagnosis Wound #1: Same as Pre-Procedure Plan Follow-up Appointments: Return Appointment in 2 weeks. - Dr. Lady Gary - Room 3 Wednesday July 26th at 1:15 pm Bathing/ Shower/ Hygiene: May shower with protection but do not get wound dressing(s) wet. Off-Loading: Other: - No direct pressure on toes WOUND #1: - T Great Wound Laterality: Left oe Cleanser: Wound Cleanser Every Other Day/30 Days Discharge Instructions: Cleanse the wound with wound cleanser prior to applying a clean dressing using gauze sponges, not tissue or cotton balls. Topical: Gentamicin Every Other Day/30 Days Discharge Instructions: As directed by physician Prim Dressing: KerraCel Ag Gelling Fiber Dressing, 2x2 in (silver alginate) Every Other Day/30 Days ary Discharge Instructions: Apply silver alginate to wound bed as instructed Secondary Dressing: Optifoam Non-Adhesive Dressing, 4x4 in Every Other Day/30 Days Discharge Instructions: Apply between 1st and 2nd toes on left foot Secondary Dressing: Woven Gauze Sponges 2x2 in Every Other Day/30 Days Discharge Instructions: Apply over primary dressing as directed. Secured With: Insurance underwriter, Sterile 2x75 (in/in) Every Other Day/30 Days Discharge Instructions: Secure with stretch gauze as directed. Secured With: Paper T ape, 1x10 (in/yd) Every Other Day/30 Days Discharge Instructions: Secure dressing with tape as directed. 06/01/2022: The wound is actually a little bit smaller today and the surface is not as pale. She still has accumulated periwound callus. I used a curette to debride slough and periwound callus from the wound. It looks like the Hydrofera Blue was just being  placed on top of the wound and not actually down in contact with the wound surface so I am going to change to silver alginate to enable more appropriate use of the dressing. We will continue using the topical gentamicin because we have seen some improvement since initiating this therapy. Follow-up in 2 weeks. Electronic Signature(s) Signed: 06/01/2022 2:07:24 PM By: Duanne Guess MD FACS Entered By: Duanne Guess on 06/01/2022 14:07:24 -------------------------------------------------------------------------------- HxROS Details Patient Name: Date  of Service: [name], [name] 06/01/2022 1:15 PM Medical Record Number: [id number] Patient Account Number: [id number] Date of Birth/Sex: Treating RN: February 25, 1931 (88 y.o. [name] Primary Care Provider: SYSTEM, PRO Rodena Goldmann Other Clinician: Referring Provider: Treating Provider/Extender: [name] in Treatment: 17 Cardiovascular Medical History: Positive for: Arrhythmia - AV block, complete heart block; Congestive Heart Failure; Hypertension Past Medical History Notes: Pulmonary Hypertension Endocrine Medical History: Positive for: Type II Diabetes Past Medical History Notes: Hypoglycemia Treated with: Oral agents Blood sugar tested every day: Yes Tested : 2-3x a day Musculoskeletal Medical History: Positive for: Gout; Osteoarthritis Neurologic Medical History: Past Medical History Notes: Acute encephalopathy Immunizations Pneumococcal Vaccine: Received Pneumococcal Vaccination: No Implantable Devices No devices added Family and Social History Never smoker; Marital Status - Widowed; Alcohol Use: Never; Drug Use: No History; Caffeine Use: Rarely; Financial Concerns: No; Food, Clothing or Shelter Needs: No; Support System Lacking: No; Transportation Concerns: No Psychologist, prison and probation services) Signed: 06/01/2022 2:51:56 PM By: Duanne Guess MD FACS Signed: 06/01/2022 5:36:06 PM By: Karie Schwalbe RN Entered  By: Duanne Guess on 06/01/2022 14:05:57 -------------------------------------------------------------------------------- SuperBill Details Patient Name: Date of Service: [name]. 06/01/2022 Medical Record Number: [id number] Patient Account Number: [id number] Date of Birth/Sex: Treating RN: 03/13/1931 (88 y.o. [name] Primary Care Provider: SYSTEM, PRO V IDER Other Clinician: Referring Provider: Treating Provider/Extender: [name] in Treatment: 17 Diagnosis Coding ICD-10 Codes Code Description E11.621 Type 2 diabetes mellitus with foot ulcer L97.522 Non-pressure chronic ulcer of other part of left foot with fat layer exposed I50.32 Chronic diastolic (congestive) heart failure I27.20 Pulmonary hypertension, unspecified Facility Procedures CPT4 Code: [id number] Description: [phone] - DEBRIDE WOUND 1ST 20 SQ CM OR < ICD-10 Diagnosis Description L97.522 Non-pressure chronic ulcer of other part of left foot with fat layer exposed Modifier: Quantity: 1 Physician Procedures : CPT4 Code Description Modifier [id number] - WC PHYS LEVEL 4 - EST PT 25 ICD-10 Diagnosis Description L97.522 Non-pressure chronic ulcer of other part of left foot with fat layer exposed E11.621 Type 2 diabetes mellitus with foot ulcer I50.32 Chronic  diastolic (congestive) heart failure I27.20 Pulmonary hypertension, unspecified Quantity: 1 : [id number] - WC PHYS DEBR WO ANESTH 20 SQ CM ICD-10 Diagnosis Description L97.522 Non-pressure chronic ulcer of other part of left foot with fat layer exposed Quantity: 1 Electronic Signature(s) Signed: 06/01/2022 2:07:46 PM By: Duanne Guess MD FACS Entered By: Duanne Guess on 06/01/2022 14:07:46

## 2022-06-01 NOTE — Progress Notes (Signed)
ATIANA, LEVIER ([id number]) Visit Report for 06/01/2022 Arrival Information Details Patient Name: Date of Service: TERRION, POBLANO 06/01/2022 1:15 PM Medical Record Number: [id number] Patient Account Number: [id number] Date of Birth/Sex: Treating RN: 03-03-1931 (88 y.o. [name], [name] Primary Care Jameika Kinn: SYSTEM, PRO V IDER Other Clinician: Referring Joselito Fieldhouse: Treating Marca Gadsby/Extender: Jadene Pierini in Treatment: 62 Visit Information History Since Last Visit Added or deleted any medications: No Patient Arrived: Wheel Chair Any new allergies or adverse reactions: No Arrival Time: 13:41 Had a fall or experienced change in No Accompanied By: caregiver activities of daily living that may affect Transfer Assistance: Manual risk of falls: Patient Identification Verified: Yes Signs or symptoms of abuse/neglect since last visito No Patient Requires Transmission-Based Precautions: No Hospitalized since last visit: No Patient Has Alerts: Yes Implantable device outside of the clinic excluding No Patient Alerts: ABI not obtainable cellular tissue based products placed in the center since last visit: Has Dressing in Place as Prescribed: Yes Pain Present Now: No Electronic Signature(s) Signed: 06/01/2022 5:36:06 PM By: Dellie Catholic RN Entered By: Dellie Catholic on 06/01/2022 13:42:08 -------------------------------------------------------------------------------- Encounter Discharge Information Details Patient Name: Date of Service: [name], [name] 06/01/2022 1:15 PM Medical Record Number: [id number] Patient Account Number: [id number] Date of Birth/Sex: Treating RN: 24-Apr-1931 (88 y.o. America Marts Primary Care Lyndell Allaire: SYSTEM, PRO Ave Filter Other Clinician: Referring Rylynne Schicker: Treating Dadrian Ballantine/Extender: Jadene Pierini in Treatment: 17 Encounter Discharge Information Items Post Procedure Vitals Discharge Condition: Stable Temperature (F):  98.5 Ambulatory Status: Wheelchair Pulse (bpm): 85 Discharge Destination: [place] Respiratory Rate (breaths/min): 18 Telephoned: No Blood Pressure (mmHg): 128/75 Orders Sent: Yes Transportation: Other Accompanied By: caregiver Schedule Follow-up Appointment: Yes Clinical Summary of Care: Patient Declined Electronic Signature(s) Signed: 06/01/2022 5:36:06 PM By: Dellie Catholic RN Entered By: Dellie Catholic on 06/01/2022 17:35:36 -------------------------------------------------------------------------------- Lower Extremity Assessment Details Patient Name: Date of Service: GIRTIE, WIERSMA 06/01/2022 1:15 PM Medical Record Number: [id number] Patient Account Number: [id number] Date of Birth/Sex: Treating RN: 02-22-1931 (88 y.o. America Strout Primary Care Aleese Kamps: SYSTEM, PRO Ave Filter Other Clinician: Referring Andromeda Poppen: Treating Nolon Yellin/Extender: Jadene Pierini in Treatment: 17 Edema Assessment Assessed: [Left: No] [Right: No] Edema: [Left: Ye] [Right: s] Calf Left: Right: Point of Measurement: 26 cm From Medial Instep 39.5 cm Ankle Left: Right: Point of Measurement: 10 cm From Medial Instep 25.2 cm Electronic Signature(s) Signed: 06/01/2022 5:36:06 PM By: Dellie Catholic RN Entered By: Dellie Catholic on 06/01/2022 13:44:13 -------------------------------------------------------------------------------- Multi Wound Chart Details Patient Name: Date of Service: [name], [name] 06/01/2022 1:15 PM Medical Record Number: [id number] Patient Account Number: [id number] Date of Birth/Sex: Treating RN: 1931-05-11 (88 y.o. America Trageser Primary Care Breindel Collier: SYSTEM, PRO Ave Filter Other Clinician: Referring Kalliope Riesen: Treating Lether Tesch/Extender: Jadene Pierini in Treatment: 17 Vital Signs Height(in): Pulse(bpm): 45 Weight(lbs): Blood Pressure(mmHg): 128/75 Body Mass Index(BMI): Temperature(F): 98.5 Respiratory  Rate(breaths/min): 18 Photos: [N/A:N/A] Left T Great oe N/A N/A Wound Location: Gradually Appeared N/A N/A Wounding Event: Diabetic Wound/Ulcer of the Lower N/A N/A Primary Etiology: Extremity Arrhythmia, Congestive Heart Failure, N/A N/A Comorbid History: Hypertension, Type II Diabetes, Gout, Osteoarthritis 09/29/2021 N/A N/A Date Acquired: 17 N/A N/A Weeks of Treatment: Open N/A N/A Wound Status: No N/A N/A Wound Recurrence: 0.4x0.4x0.2 N/A N/A Measurements L x W x D (cm) 0.126 N/A N/A A (cm) : rea 0.025 N/A N/A Volume (cm) : 55.50% N/A N/A % Reduction in A rea: 56.10% N/A N/A % Reduction in Volume: Grade 2 N/A  N/A Classification: Medium N/A N/A Exudate A mount: Serous N/A N/A Exudate Type: amber N/A N/A Exudate Color: Flat and Intact N/A N/A Wound Margin: Large (67-100%) N/A N/A Granulation A mount: Pink N/A N/A Granulation Quality: Small (1-33%) N/A N/A Necrotic A mount: Fat Layer (Subcutaneous Tissue): Yes N/A N/A Exposed Structures: Fascia: No Tendon: No Muscle: No Joint: No Bone: No Small (1-33%) N/A N/A Epithelialization: Debridement - Selective/Open Wound N/A N/A Debridement: Pre-procedure Verification/Time Out 13:53 N/A N/A Taken: Lidocaine 5% topical ointment N/A N/A Pain Control: Callus, Slough N/A N/A Tissue Debrided: Non-Viable Tissue N/A N/A Level: 0.16 N/A N/A Debridement A (sq cm): rea Curette N/A N/A Instrument: Minimum N/A N/A Bleeding: Pressure N/A N/A Hemostasis A chieved: 0 N/A N/A Procedural Pain: 0 N/A N/A Post Procedural Pain: Procedure was tolerated well N/A N/A Debridement Treatment Response: 0.4x0.4x0.2 N/A N/A Post Debridement Measurements L x W x D (cm) 0.025 N/A N/A Post Debridement Volume: (cm) Debridement N/A N/A Procedures Performed: Treatment Notes Electronic Signature(s) Signed: 06/01/2022 2:03:13 PM By: Fredirick Maudlin MD FACS Signed: 06/01/2022 5:36:06 PM By: Dellie Catholic RN Entered  By: Fredirick Maudlin on 06/01/2022 14:03:12 -------------------------------------------------------------------------------- Multi-Disciplinary Care Plan Details Patient Name: Date of Service: [name], [name] 06/01/2022 1:15 PM Medical Record Number: [id number] Patient Account Number: [id number] Date of Birth/Sex: Treating RN: 03/28/1931 (88 y.o. America Nham Primary Care Leovanni Bjorkman: SYSTEM, PRO Ave Filter Other Clinician: Referring Kiowa Hollar: Treating Wanda Cellucci/Extender: Jadene Pierini in Treatment: [place] reviewed with physician Active Inactive Nutrition Nursing Diagnoses: Impaired glucose control: actual or potential Potential for alteratiion in Nutrition/Potential for imbalanced nutrition Goals: Patient/caregiver will maintain therapeutic glucose control Date Initiated: 02/01/2022 Target Resolution Date: 07/21/2022 Goal Status: Active Interventions: Assess HgA1c results as ordered upon admission and as needed Assess patient nutrition upon admission and as needed per policy Provide education on elevated blood sugars and impact on wound healing Provide education on nutrition Treatment Activities: Education provided on Nutrition : 02/01/2022 Notes: Wound/Skin Impairment Nursing Diagnoses: Impaired tissue integrity Knowledge deficit related to ulceration/compromised skin integrity Goals: Patient/caregiver will verbalize understanding of skin care regimen Date Initiated: 02/01/2022 Date Inactivated: 05/18/2022 Target Resolution Date: 04/28/2022 Goal Status: Met Ulcer/skin breakdown will have a volume reduction of 30% by week 4 Date Initiated: 02/01/2022 Date Inactivated: 03/24/2022 Target Resolution Date: 03/31/2022 Goal Status: Unmet Unmet Reason: PAD Ulcer/skin breakdown will have a volume reduction of 50% by week 8 Date Initiated: 04/06/2022 Target Resolution Date: 07/21/2022 Goal Status: Active Interventions: Assess patient/caregiver ability  to obtain necessary supplies Assess patient/caregiver ability to perform ulcer/skin care regimen upon admission and as needed Assess ulceration(s) every visit Provide education on ulcer and skin care Notes: Electronic Signature(s) Signed: 06/01/2022 5:36:06 PM By: Dellie Catholic RN Entered By: Dellie Catholic on 06/01/2022 17:33:26 -------------------------------------------------------------------------------- Pain Assessment Details Patient Name: Date of Service: SHARRY, BEINING 06/01/2022 1:15 PM Medical Record Number: [id number] Patient Account Number: [id number] Date of Birth/Sex: Treating RN: 02-04-1931 (88 y.o. America Najera Primary Care Silvana Holecek: SYSTEM, PRO Ave Filter Other Clinician: Referring Marcheta Horsey: Treating Kelan Pritt/Extender: Jadene Pierini in Treatment: 17 Active Problems Location of Pain Severity and Description of Pain Patient Has Paino No Site Locations Pain Management and Medication Current Pain Management: Electronic Signature(s) Signed: 06/01/2022 5:36:06 PM By: Dellie Catholic RN Entered By: Dellie Catholic on 06/01/2022 13:44:09 -------------------------------------------------------------------------------- Patient/Caregiver Education Details Patient Name: Date of Service: [name] 7/12/2023andnbsp1:15 PM Medical Record Number: [id number] Patient Account Number: [id number] Date of Birth/Gender: Treating RN: Jan 28, 1931 (88 y.o.  America Hubner Primary Care Physician: SYSTEM, PRO V IDER Other Clinician: Referring Physician: Treating Physician/Extender: Jadene Pierini in Treatment: 17 Education Assessment Education Provided To: Patient Education Topics Provided Wound/Skin Impairment: Methods: Explain/Verbal Responses: Return demonstration correctly Electronic Signature(s) Signed: 06/01/2022 5:36:06 PM By: Dellie Catholic RN Entered By: Dellie Catholic on 06/01/2022  17:33:39 -------------------------------------------------------------------------------- Wound Assessment Details Patient Name: Date of Service: [name] 06/01/2022 1:15 PM Medical Record Number: [id number] Patient Account Number: [id number] Date of Birth/Sex: Treating RN: 1931-11-20 (88 y.o. America Mclean Primary Care Evoleth Nordmeyer: SYSTEM, PRO V IDER Other Clinician: Referring Tylicia Sherman: Treating Chaze Hruska/Extender: Jadene Pierini in Treatment: 17 Wound Status Wound Number: 1 Primary Diabetic Wound/Ulcer of the Lower Extremity Etiology: Wound Location: Left T Great oe Wound Open Wounding Event: Gradually Appeared Status: Date Acquired: 09/29/2021 Comorbid Arrhythmia, Congestive Heart Failure, Hypertension, Type II Weeks Of Treatment: 17 History: Diabetes, Gout, Osteoarthritis Clustered Wound: No Photos Wound Measurements Length: (cm) 0.4 Width: (cm) 0.4 Depth: (cm) 0.2 Area: (cm) 0.126 Volume: (cm) 0.025 % Reduction in Area: 55.5% % Reduction in Volume: 56.1% Epithelialization: Small (1-33%) Tunneling: No Undermining: No Wound Description Classification: Grade 2 Wound Margin: Flat and Intact Exudate Amount: Medium Exudate Type: Serous Exudate Color: amber Foul Odor After Cleansing: No Slough/Fibrino Yes Wound Bed Granulation Amount: Large (67-100%) Exposed Structure Granulation Quality: Pink Fascia Exposed: No Necrotic Amount: Small (1-33%) Fat Layer (Subcutaneous Tissue) Exposed: Yes Necrotic Quality: Adherent Slough Tendon Exposed: No Muscle Exposed: No Joint Exposed: No Bone Exposed: No Treatment Notes Wound #1 (Toe Great) Wound Laterality: Left Cleanser Wound Cleanser Discharge Instruction: Cleanse the wound with wound cleanser prior to applying a clean dressing using gauze sponges, not tissue or cotton balls. Peri-Wound Care Topical Gentamicin Discharge Instruction: As directed by physician Primary Dressing KerraCel Ag  Gelling Fiber Dressing, 2x2 in (silver alginate) Discharge Instruction: Apply silver alginate to wound bed as instructed Secondary Dressing Optifoam Non-Adhesive Dressing, 4x4 in Discharge Instruction: Apply between 1st and 2nd toes on left foot Woven Gauze Sponges 2x2 in Discharge Instruction: Apply over primary dressing as directed. Secured With Conforming Stretch Gauze Bandage, Sterile 2x75 (in/in) Discharge Instruction: Secure with stretch gauze as directed. Paper Tape, 1x10 (in/yd) Discharge Instruction: Secure dressing with tape as directed. Compression Wrap Compression Stockings Add-Ons Electronic Signature(s) Signed: 06/01/2022 5:36:06 PM By: Dellie Catholic RN Entered By: Dellie Catholic on 06/01/2022 13:47:40 -------------------------------------------------------------------------------- Vitals Details Patient Name: Date of Service: [name], [name] 06/01/2022 1:15 PM Medical Record Number: [id number] Patient Account Number: [id number] Date of Birth/Sex: Treating RN: Jul 11, 1931 (88 y.o. America Crego Primary Care Westlynn Fifer: SYSTEM, PRO V IDER Other Clinician: Referring Ruie Sendejo: Treating Artavia Jeanlouis/Extender: Jadene Pierini in Treatment: 17 Vital Signs Time Taken: 13:42 Temperature (F): 98.5 Pulse (bpm): 85 Respiratory Rate (breaths/min): 18 Blood Pressure (mmHg): 128/75 Reference Range: 80 - 120 mg / dl Electronic Signature(s) Signed: 06/01/2022 5:36:06 PM By: Dellie Catholic RN Entered By: Dellie Catholic on 06/01/2022 13:44:04

## 2022-06-03 NOTE — Progress Notes (Signed)
[name], [name] ([id number]) Visit Report for 05/18/2022 Chief Complaint Document Details Patient Name: Date of Service: [name], [name] 05/18/2022 10:15 A M Medical Record Number: [id number] Patient Account Number: [id number] Date of Birth/Sex: Treating RN: 04-Dec-1930 (88 y.o. [name] Primary Care Provider: SYSTEM, PRO Rodena Goldmann Other Clinician: Referring Provider: Treating Provider/Extender: [name] in Treatment: 15 Information Obtained from: Patient Chief Complaint Patient presents to the wound care center with open non-healing surgical wound(s) Electronic Signature(s) Signed: 05/18/2022 10:31:45 AM By: Duanne Guess MD FACS Entered By: Duanne Guess on 05/18/2022 10:31:45 -------------------------------------------------------------------------------- Debridement Details Patient Name: Date of Service: [name]. 05/18/2022 10:15 A M Medical Record Number: [id number] Patient Account Number: [id number] Date of Birth/Sex: Treating RN: [birth year] (88 y.o. [name] Primary Care Provider: SYSTEM, PRO V IDER Other Clinician: Referring Provider: Treating Provider/Extender: [name] in Treatment: 15 Debridement Performed for Assessment: Wound #1 Left T Great oe Performed By: Physician Duanne Guess, MD Debridement Type: Debridement Severity of Tissue Pre Debridement: Fat layer exposed Level of Consciousness (Pre-procedure): Awake and Alert Pre-procedure Verification/Time Out Yes - 10:24 Taken: Start Time: 10:24 Pain Control: Lidocaine 5% topical ointment T Area Debrided (L x W): otal 0.5 (cm) x 0.6 (cm) = 0.3 (cm) Tissue and other material debrided: Non-Viable, Callus Level: Non-Viable Tissue Debridement Description: Selective/Open Wound Instrument: Curette Bleeding: Minimum Hemostasis Achieved: Pressure End Time: 10:25 Procedural Pain: 0 Post Procedural Pain: 0 Response to Treatment: Procedure was tolerated  well Level of Consciousness (Post- Awake and Alert procedure): Post Debridement Measurements of Total Wound Length: (cm) 0.5 Width: (cm) 0.6 Depth: (cm) 0.2 Volume: (cm) 0.047 Character of Wound/Ulcer Post Debridement: Improved Severity of Tissue Post Debridement: Fat layer exposed Post Procedure Diagnosis Same as Pre-procedure Electronic Signature(s) Signed: 05/18/2022 12:23:36 PM By: Duanne Guess MD FACS Signed: 05/18/2022 4:55:09 PM By: Karie Schwalbe RN Entered By: Karie Schwalbe on 05/18/2022 10:26:21 -------------------------------------------------------------------------------- HPI Details Patient Name: Date of Service: [name]. 05/18/2022 10:15 A M Medical Record Number: [id number] Patient Account Number: [id number] Date of Birth/Sex: Treating RN: [birth year] (88 y.o. [name] Primary Care Provider: SYSTEM, PRO Rodena Goldmann Other Clinician: Referring Provider: Treating Provider/Extender: [name] in Treatment: 15 History of Present Illness HPI Description: ADMISSION 02/01/2022 This is an 88 year old woman who is a resident of [place]. She has a past medical history notable for type 2 diabetes mellitus, congestive heart failure, pulmonary hypertension, and complete heart block. She is accompanied today by her son. Approximately 4 months ago, she had an ingrown toe nail removed. Apparently this went a bit deep. Since that time, it has not healed. The patient is nonambulatory. Apparently they have been applying Santyl to the site at her facility. She was on antibiotics but I am not certain which specific drugs she took. She completed her last course a few weeks ago. We were unable to obtain ABIs in clinic secondary to the patient's body habitus. She has not had any formal vascular studies nor any x-ray of the area. 02/09/2022: The wound is roughly the same size today, but the amount of slough in the base has decreased significantly. She  apparently had an x-ray done at her facility, but we do not have the report; per verbal report from facility staff, "there was nothing there." We had requested formal ABI/TBI studies, but these were not done. She has been in [place] under [employer]	[place]. 02/16/2022: The wound measured about the same today, but to my eye,  it appears rather smaller. The slough in the base is minimal. Her vascular studies are scheduled for Friday. We have been using Santyl under Hydrofera Blue. No concern for infection. 02/23/2022: Once again, the wound has been measured about the same size, but I think it looks a bit smaller. There is slough at the base. She had her ABIs done last week and these show severe bilateral peripheral arterial disease. We received the report of the foot x-ray that was ordered. There is no concern for osteomyelitis at this time. 03/02/2022: The wound is smaller today with a small amount of slough at the base. We placed a referral to vein and vascular surgery last week in response to her severe peripheral arterial disease, but she does not yet have an appointment on the books. 03/16/2022: She saw Dr. Lenell Antu in the vascular surgery department yesterday. He did not think she was a good candidate for any revascularization options and suggested that she continue local wound care for another month. If she fails to heal in that time, they may consider an angiogram with potential intervention. The wound today is roughly the same size but there is less exposed bone at the base and the surface is clean without any substantial slough. 03/24/2022: The wound appears about the same. There is some periwound callus that has heaped up. We changed her to Prisma silver collagen last week. 04/06/2022: No significant change to the wound. It is neither improving nor deteriorating. She continues to accumulate some periwound callus around the margin. Bone is exposed at the base. 04/20/2022: The wound has improved quite a bit  over the past 2 weeks. It is smaller and there is no real accumulation of periwound callus. She had follow-up with Dr. Lenell Antu yesterday and he felt that the wound was progressing well enough that no arterial intervention was necessary. 05/18/2022: The patient has been absent due to a fall from her wheelchair during her last transportation that was supposed to be to her clinic. Today, the wound is about the same size. There is still bone exposed at the base. There is periwound callus accumulation. No concern for infection. Electronic Signature(s) Signed: 05/18/2022 10:44:04 AM By: Duanne Guess MD FACS Entered By: Duanne Guess on 05/18/2022 10:44:04 -------------------------------------------------------------------------------- Physical Exam Details Patient Name: Date of Service: [name] 05/18/2022 10:15 A M Medical Record Number: [id number] Patient Account Number: [id number] Date of Birth/Sex: Treating RN: 07-05-1931 (88 y.o. [name] Primary Care Provider: SYSTEM, PRO Rodena Goldmann Other Clinician: Referring Provider: Treating Provider/Extender: [name] in Treatment: 15 Constitutional . . . . No acute distress.[name] Respiratory Normal work of breathing on room air.. Notes 05/18/2022: Today, the wound is about the same size. There is still bone exposed at the base. There is periwound callus accumulation. No concern for infection. Electronic Signature(s) Signed: 05/18/2022 10:45:45 AM By: Duanne Guess MD FACS Entered By: Duanne Guess on 05/18/2022 10:45:45 -------------------------------------------------------------------------------- Physician Orders Details Patient Name: Date of Service: [name]. 05/18/2022 10:15 A M Medical Record Number: [id number] Patient Account Number: [id number] Date of Birth/Sex: Treating RN: 1931/02/21 (88 y.o. [name] Primary Care Provider: SYSTEM, PRO V IDER Other Clinician: Referring  Provider: Treating Provider/Extender: [name] in Treatment: 15 Verbal / Phone Orders: No Diagnosis Coding ICD-10 Coding Code Description E11.621 Type 2 diabetes mellitus with foot ulcer L97.522 Non-pressure chronic ulcer of other part of left foot with fat layer exposed I50.32 Chronic diastolic (congestive) heart failure I27.20 Pulmonary hypertension, unspecified Follow-up  Appointments ppointment in 2 weeks. - Dr. Celine Ahr - Room 3 Wed 06/01/22 at 1:15pm Return A Bathing/ Shower/ Hygiene May shower with protection but do not get wound dressing(s) wet. Off-Loading Other: - No direct pressure on toes Wound Treatment Wound #1 - T Great oe Wound Laterality: Left Cleanser: Wound Cleanser Every Other Day/30 Days Discharge Instructions: Cleanse the wound with wound cleanser prior to applying a clean dressing using gauze sponges, not tissue or cotton balls. Topical: Gentamicin Every Other Day/30 Days Discharge Instructions: As directed by physician Prim Dressing: Hydrofera Blue Ready Foam, 2.5 x2.5 in Every Other Day/30 Days ary Discharge Instructions: Apply to wound bed as instructed Secondary Dressing: Optifoam Non-Adhesive Dressing, 4x4 in Every Other Day/30 Days Discharge Instructions: Apply between 1st and 2nd toes on left foot Secondary Dressing: Woven Gauze Sponges 2x2 in Every Other Day/30 Days Discharge Instructions: Apply over primary dressing as directed. Secured With: Child psychotherapist, Sterile 2x75 (in/in) Every Other Day/30 Days Discharge Instructions: Secure with stretch gauze as directed. Secured With: Paper Tape, 1x10 (in/yd) Every Other Day/30 Days Discharge Instructions: Secure dressing with tape as directed. Electronic Signature(s) Signed: 05/18/2022 12:23:36 PM By: Fredirick Maudlin MD FACS Entered By: Fredirick Maudlin on 05/18/2022 10:45:59 -------------------------------------------------------------------------------- Problem List  Details Patient Name: Date of Service: [name] 05/18/2022 10:15 A M Medical Record Number: [id number] Patient Account Number: [id number] Date of Birth/Sex: Treating RN: 05-Jun-1931 (88 y.o. America Waskey Primary Care Provider: SYSTEM, PRO Ave Filter Other Clinician: Referring Provider: Treating Provider/Extender: Jadene Pierini in Treatment: 15 Active Problems ICD-10 Encounter Code Description Active Date MDM Diagnosis E11.621 Type 2 diabetes mellitus with foot ulcer 02/01/2022 No Yes L97.522 Non-pressure chronic ulcer of other part of left foot with fat layer exposed 02/01/2022 No Yes [id number] Chronic diastolic (congestive) heart failure 02/01/2022 No Yes I27.20 Pulmonary hypertension, unspecified 02/01/2022 No Yes Inactive Problems Resolved Problems Electronic Signature(s) Signed: 05/18/2022 10:31:30 AM By: Fredirick Maudlin MD FACS Entered By: Fredirick Maudlin on 05/18/2022 10:31:30 -------------------------------------------------------------------------------- Progress Note Details Patient Name: Date of Service: [name] 05/18/2022 10:15 A M Medical Record Number: [id number] Patient Account Number: [id number] Date of Birth/Sex: Treating RN: 1931/05/17 (88 y.o. America Torry Primary Care Provider: SYSTEM, PRO Ave Filter Other Clinician: Referring Provider: Treating Provider/Extender: Jadene Pierini in Treatment: 15 Subjective Chief Complaint Information obtained from Patient Patient presents to the wound care center with open non-healing surgical wound(s) History of Present Illness (HPI) ADMISSION 02/01/2022 This is an 88 year old woman who is a resident of [place]. She has a past medical history notable for type 2 diabetes mellitus, congestive heart failure, pulmonary hypertension, and complete heart block. She is accompanied today by her son. Approximately 4 months ago, she had an ingrown toe nail removed. Apparently this went a bit  deep. Since that time, it has not healed. The patient is nonambulatory. Apparently they have been applying Santyl to the site at her facility. She was on antibiotics but I am not certain which specific drugs she took. She completed her last course a few weeks ago. We were unable to obtain ABIs in clinic secondary to the patient's body habitus. She has not had any formal vascular studies nor any x-ray of the area. 02/09/2022: The wound is roughly the same size today, but the amount of slough in the base has decreased significantly. She apparently had an x-ray done at her facility, but we do not have the report; per verbal report from facility staff, "there was  nothing there." We had requested formal ABI/TBI studies, but these were not done. She has been in [place] under [employer]. 02/16/2022: The wound measured about the same today, but to my eye, it appears rather smaller. The slough in the base is minimal. Her vascular studies are scheduled for Friday. We have been using Santyl under Hydrofera Blue. No concern for infection. 02/23/2022: Once again, the wound has been measured about the same size, but I think it looks a bit smaller. There is slough at the base. She had her ABIs done last week and these show severe bilateral peripheral arterial disease. We received the report of the foot x-ray that was ordered. There is no concern for osteomyelitis at this time. 03/02/2022: The wound is smaller today with a small amount of slough at the base. We placed a referral to vein and vascular surgery last week in response to her severe peripheral arterial disease, but she does not yet have an appointment on the books. 03/16/2022: She saw Dr. Stanford Breed in the vascular surgery department yesterday. He did not think she was a good candidate for any revascularization options and suggested that she continue local wound care for another month. If she fails to heal in that time, they may consider an angiogram with  potential intervention. The wound today is roughly the same size but there is less exposed bone at the base and the surface is clean without any substantial slough. 03/24/2022: The wound appears about the same. There is some periwound callus that has heaped up. We changed her to Prisma silver collagen last week. 04/06/2022: No significant change to the wound. It is neither improving nor deteriorating. She continues to accumulate some periwound callus around the margin. Bone is exposed at the base. 04/20/2022: The wound has improved quite a bit over the past 2 weeks. It is smaller and there is no real accumulation of periwound callus. She had follow-up with Dr. Stanford Breed yesterday and he felt that the wound was progressing well enough that no arterial intervention was necessary. 05/18/2022: The patient has been absent due to a fall from her wheelchair during her last transportation that was supposed to be to her clinic. Today, the wound is about the same size. There is still bone exposed at the base. There is periwound callus accumulation. No concern for infection. Patient History Social History Never smoker, Marital Status - Widowed, Alcohol Use - Never, Drug Use - No History, Caffeine Use - Rarely. Medical History Cardiovascular Patient has history of Arrhythmia - AV block, complete heart block, Congestive Heart Failure, Hypertension Endocrine Patient has history of Type II Diabetes Musculoskeletal Patient has history of Gout, Osteoarthritis Medical A Surgical History Notes nd Cardiovascular Pulmonary Hypertension Endocrine Hypoglycemia Neurologic Acute encephalopathy Objective Constitutional No acute distress.. Vitals Time Taken: 10:09 AM, Temperature: 98.1 F, Pulse: 89 bpm, Respiratory Rate: 18 breaths/min, Blood Pressure: 132/72 mmHg. Respiratory Normal work of breathing on room air.. General Notes: 05/18/2022: Today, the wound is about the same size. There is still bone exposed at the  base. There is periwound callus accumulation. No concern for infection. Integumentary (Hair, Skin) Wound #1 status is Open. Original cause of wound was Gradually Appeared. The date acquired was: 09/29/2021. The wound has been in treatment 15 weeks. The wound is located on the Left T Great. The wound measures 0.5cm length x 0.6cm width x 0.2cm depth; 0.236cm^2 area and 0.047cm^3 volume. There is Fat oe Layer (Subcutaneous Tissue) exposed. There is no tunneling or undermining noted. There is  a medium amount of serous drainage noted. The wound margin is flat and intact. There is small (1-33%) red granulation within the wound bed. There is a large (67-100%) amount of necrotic tissue within the wound bed including Adherent Slough. 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 There was a Selective/Open Wound Non-Viable Tissue Debridement with a total area of 0.3 sq cm performed by Fredirick Maudlin, MD. With the following instrument(s): Curette to remove Non-Viable tissue/material. Material removed includes Callus after achieving pain control using Lidocaine 5% topical ointment. No specimens were taken. A time out was conducted at 10:24, prior to the start of the procedure. A Minimum amount of bleeding was controlled with Pressure. The procedure was tolerated well with a pain level of 0 throughout and a pain level of 0 following the procedure. Post Debridement Measurements: 0.5cm length x 0.6cm width x 0.2cm depth; 0.047cm^3 volume. Character of Wound/Ulcer Post Debridement is improved. Severity of Tissue Post Debridement  is: Fat layer exposed. Post procedure Diagnosis Wound #1: Same as Pre-Procedure Plan Follow-up Appointments: Return Appointment in 2 weeks. - Dr. Celine Ahr - Room 3 Wed 06/01/22 at 1:15pm Bathing/ Shower/ Hygiene: May shower with protection but do not get wound dressing(s) wet. Off-Loading: Other: - No direct pressure on toes WOUND #1: - T Great Wound Laterality: Left oe Cleanser: Wound Cleanser Every Other Day/30 Days Discharge Instructions: Cleanse the wound with wound cleanser prior to applying a clean dressing using gauze sponges, not tissue or cotton balls. Topical: Gentamicin Every Other Day/30 Days Discharge Instructions: As directed by physician Prim Dressing: Hydrofera Blue Ready Foam, 2.5 x2.5 in Every Other Day/30 Days ary Discharge Instructions: Apply to wound bed as instructed Secondary Dressing: Optifoam Non-Adhesive Dressing, 4x4 in Every Other Day/30 Days Discharge Instructions: Apply between 1st and 2nd toes on left foot Secondary Dressing: Woven Gauze Sponges 2x2 in Every Other Day/30 Days Discharge Instructions: Apply over primary dressing as directed. Secured With: Child psychotherapist, Sterile 2x75 (in/in) Every Other Day/30 Days Discharge Instructions: Secure with stretch gauze as directed. Secured With: Paper T ape, 1x10 (in/yd) Every Other Day/30 Days Discharge Instructions: Secure dressing with tape as directed. 05/18/2022: The patient has been absent due to a fall from her wheelchair during her last transportation that was supposed to be to her clinic. Today, the wound is about the same size. There is still bone exposed at the base. There is periwound callus accumulation. No concern for infection. I used a curette to debride the periwound callus. No significant slough on the wound surface. Due to the exposed bone, I am going to add a little topical gentamicin underneath the Hydrofera Blue. She is not really a surgical candidate and I cannot see her  tolerating an MRI for further investigation. She will follow-up in 2 weeks. Electronic Signature(s) Signed: 05/18/2022 10:46:59 AM By: Fredirick Maudlin MD FACS Entered By: Fredirick Maudlin on 05/18/2022 10:46:59 -------------------------------------------------------------------------------- HxROS Details Patient Name: Date of Service: [name] 05/18/2022 10:15 A M Medical Record Number: [id number] Patient Account Number: [id number] Date of Birth/Sex: Treating RN: August 15, 1931 (88 y.o. America Curro Primary Care Provider: SYSTEM, PRO Ave Filter Other Clinician: Referring Provider: Treating  Provider/Extender: Jadene Pierini in Treatment: 15 Cardiovascular Medical History: Positive for: Arrhythmia - AV block, complete heart block; Congestive Heart Failure; Hypertension Past Medical History Notes: Pulmonary Hypertension Endocrine Medical History: Positive for: Type II Diabetes Past Medical History Notes: Hypoglycemia Treated with: Oral agents Blood sugar tested every day: Yes Tested : 2-3x a day Musculoskeletal Medical History: Positive for: Gout; Osteoarthritis Neurologic Medical History: Past Medical History Notes: Acute encephalopathy Immunizations Pneumococcal Vaccine: Received Pneumococcal Vaccination: No Implantable Devices No devices added Family and Social History Never smoker; Marital Status - Widowed; Alcohol Use: Never; Drug Use: No History; Caffeine Use: Rarely; Financial Concerns: No; Food, Clothing or Shelter Needs: No; Support System Lacking: No; Transportation Concerns: No Engineer, maintenance) Signed: 05/18/2022 12:23:36 PM By: Fredirick Maudlin MD FACS Signed: 05/18/2022 4:55:09 PM By: Dellie Catholic RN Entered By: Fredirick Maudlin on 05/18/2022 10:44:11 -------------------------------------------------------------------------------- SuperBill Details Patient Name: Date of Service: [name] 05/18/2022 Medical Record Number:  [id number] Patient Account Number: [id number] Date of Birth/Sex: Treating RN: 1931/07/19 (88 y.o. America Tremain Primary Care Provider: SYSTEM, PRO V IDER Other Clinician: Referring Provider: Treating Provider/Extender: Jadene Pierini in Treatment: 15 Diagnosis Coding ICD-10 Codes Code Description E11.621 Type 2 diabetes mellitus with foot ulcer L97.522 Non-pressure chronic ulcer of other part of left foot with fat layer exposed [id number] Chronic diastolic (congestive) heart failure I27.20 Pulmonary hypertension, unspecified Facility Procedures CPT4 Code: [id number] Description: [phone] - DEBRIDE WOUND 1ST 20 SQ CM OR < ICD-10 Diagnosis Description L97.522 Non-pressure chronic ulcer of other part of left foot with fat layer exposed Modifier: Quantity: 1 Physician Procedures : CPT4 Code Description Modifier [id number] - WC PHYS LEVEL 3 - EST PT 25 ICD-10 Diagnosis Description E11.621 Type 2 diabetes mellitus with foot ulcer L97.522 Non-pressure chronic ulcer of other part of left foot with fat layer exposed [id number] Chronic  diastolic (congestive) heart failure I27.20 Pulmonary hypertension, unspecified Quantity: 1 : [id number] - WC PHYS DEBR WO ANESTH 20 SQ CM ICD-10 Diagnosis Description L97.522 Non-pressure chronic ulcer of other part of left foot with fat layer exposed Quantity: 1 Electronic Signature(s) Signed: 05/18/2022 10:47:23 AM By: Fredirick Maudlin MD FACS Entered By: Fredirick Maudlin on 05/18/2022 10:47:22

## 2022-06-15 ENCOUNTER — Encounter (HOSPITAL_BASED_OUTPATIENT_CLINIC_OR_DEPARTMENT_OTHER): Payer: Medicare Other | Admitting: General Surgery

## 2022-06-15 DIAGNOSIS — E11621 Type 2 diabetes mellitus with foot ulcer: Secondary | ICD-10-CM | POA: Diagnosis not present

## 2022-06-15 NOTE — Progress Notes (Signed)
[name], [name] ([id number]) Visit Report for 06/15/2022 Arrival Information Details Patient Name: Date of Service: [name], [name] 06/15/2022 1:15 PM Medical Record Number: [id number] Patient Account Number: [id number] Date of Birth/Sex: Treating RN: Jan 04, 1931 (88 y.o. [name] Primary Care Lydian Chavous: SYSTEM, PRO V IDER Other Clinician: Referring Constantinos Krempasky: Treating Ayyub Krall/Extender: Jadene Pierini in Treatment: 19 Visit Information History Since Last Visit Added or deleted any medications: No Patient Arrived: Wheel Chair Any new allergies or adverse reactions: No Arrival Time: 13:39 Had a fall or experienced change in No Accompanied By: self activities of daily living that may affect Transfer Assistance: None risk of falls: Patient Identification Verified: Yes Signs or symptoms of abuse/neglect since last visito No Secondary Verification Process Completed: Yes Hospitalized since last visit: No Patient Requires Transmission-Based Precautions: No Implantable device outside of the clinic excluding No Patient Has Alerts: Yes cellular tissue based products placed in the center Patient Alerts: [name] not obtainable since last visit: Has Dressing in Place as Prescribed: Yes Pain Present Now: No Electronic Signature(s) Signed: 06/15/2022 4:59:32 PM By: Adline Peals Entered By: Adline Peals on 06/15/2022 13:42:47 -------------------------------------------------------------------------------- Encounter Discharge Information Details Patient Name: Date of Service: [name], [name] Camps. 06/15/2022 1:15 PM Medical Record Number: [id number] Patient Account Number: [id number] Date of Birth/Sex: Treating RN: 03-08-1931 (88 y.o. [name] Primary Care Alee Gressman: SYSTEM, PRO Ave Filter Other Clinician: Referring Raquell Richer: Treating Itzia Cunliffe/Extender: Jadene Pierini in Treatment: 19 Encounter Discharge Information Items Post Procedure  Vitals Discharge Condition: Stable Temperature (F): 98.2 Ambulatory Status: Wheelchair Pulse (bpm): 84 Discharge Destination: Home Respiratory Rate (breaths/min): 18 Transportation: Private Auto Blood Pressure (mmHg): 111/64 Accompanied By: son and Schedule Follow-up Appointment: Yes Clinical Summary of Care: Patient Declined Electronic Signature(s) Signed: 06/15/2022 6:02:50 PM By: Dellie Catholic RN Entered By: Dellie Catholic on 06/15/2022 18:02:16 -------------------------------------------------------------------------------- Lower Extremity Assessment Details Patient Name: Date of Service: [name] 06/15/2022 1:15 PM Medical Record Number: [id number] Patient Account Number: [id number] Date of Birth/Sex: Treating RN: 02/28/1931 (88 y.o. [name] Primary Care Reyaansh Merlo: SYSTEM, PRO Ave Filter Other Clinician: Referring Chiana Wamser: Treating Jourden Delmont/Extender: Jadene Pierini in Treatment: 19 Edema Assessment Assessed: [Left: No] [Right: No] Edema: [Left: Ye] [Right: s] Calf Left: Right: Point of Measurement: 26 cm From Medial Instep 39.5 cm Ankle Left: Right: Point of Measurement: 10 cm From Medial Instep 25.2 cm Electronic Signature(s) Signed: 06/15/2022 4:59:32 PM By: Adline Peals Entered By: Adline Peals on 06/15/2022 13:43:16 -------------------------------------------------------------------------------- Multi Wound Chart Details Patient Name: Date of Service: [name]. 06/15/2022 1:15 PM Medical Record Number: [id number] Patient Account Number: [id number] Date of Birth/Sex: Treating RN: 11/19/1931 (88 y.o. [name] Primary Care Payson Evrard: SYSTEM, PRO Ave Filter Other Clinician: Referring Cherylene Ferrufino: Treating Simya Tercero/Extender: Jadene Pierini in Treatment: 19 Vital Signs Height(in): Pulse(bpm): 39 Weight(lbs): Blood Pressure(mmHg): 111/64 Body Mass Index(BMI): Temperature(F): 98.2 Respiratory  Rate(breaths/min): 18 Photos: [N/A:N/A] Left T Great oe N/A N/A Wound Location: Gradually Appeared N/A N/A Wounding Event: Diabetic Wound/Ulcer of the Lower N/A N/A Primary Etiology: Extremity Arrhythmia, Congestive Heart Failure, N/A N/A Comorbid History: Hypertension, Type II Diabetes, Gout, Osteoarthritis 09/29/2021 N/A N/A Date Acquired: 31 N/A N/A Weeks of Treatment: Open N/A N/A Wound Status: No N/A N/A Wound Recurrence: 0.4x0.3x0.2 N/A N/A Measurements L x W x D (cm) 0.094 N/A N/A A (cm) : rea 0.019 N/A N/A Volume (cm) : 66.80% N/A N/A % Reduction in A rea: 66.70% N/A N/A % Reduction in Volume: Grade 2 N/A N/A Classification:  Medium N/A N/A Exudate A mount: Serous N/A N/A Exudate Type: amber N/A N/A Exudate Color: Flat and Intact N/A N/A Wound Margin: Small (1-33%) N/A N/A Granulation A mount: Pink N/A N/A Granulation Quality: Large (67-100%) N/A N/A Necrotic A mount: Fat Layer (Subcutaneous Tissue): Yes N/A N/A Exposed Structures: Fascia: No Tendon: No Muscle: No Joint: No Bone: No Small (1-33%) N/A N/A Epithelialization: Debridement - Selective/Open Wound N/A N/A Debridement: Pre-procedure Verification/Time Out 13:55 N/A N/A Taken: Lidocaine 4% Topical Solution N/A N/A Pain Control: Slough N/A N/A Tissue Debrided: Non-Viable Tissue N/A N/A Level: 0.12 N/A N/A Debridement A (sq cm): rea Curette N/A N/A Instrument: Minimum N/A N/A Bleeding: Pressure N/A N/A Hemostasis A chieved: 0 N/A N/A Procedural Pain: 0 N/A N/A Post Procedural Pain: Procedure was tolerated well N/A N/A Debridement Treatment Response: 0.4x0.3x0.2 N/A N/A Post Debridement Measurements L x W x D (cm) 0.019 N/A N/A Post Debridement Volume: (cm) Debridement N/A N/A Procedures Performed: Treatment Notes Electronic Signature(s) Signed: 06/15/2022 2:03:55 PM By: Fredirick Maudlin MD FACS Signed: 06/15/2022 6:02:50 PM By: Dellie Catholic RN Entered By:  Fredirick Maudlin on 06/15/2022 14:03:55 -------------------------------------------------------------------------------- Multi-Disciplinary Care Plan Details Patient Name: Date of Service: [name]. 06/15/2022 1:15 PM Medical Record Number: [id number] Patient Account Number: [id number] Date of Birth/Sex: Treating RN: 11-24-1930 (88 y.o. [name] Primary Care Mattis Featherly: SYSTEM, PRO Ave Filter Other Clinician: Referring Sholonda Jobst: Treating Smita Lesh/Extender: Jadene Pierini in Treatment: [place] reviewed with physician Active Inactive Nutrition Nursing Diagnoses: Impaired glucose control: actual or potential Potential for alteratiion in Nutrition/Potential for imbalanced nutrition Goals: Patient/caregiver will maintain therapeutic glucose control Date Initiated: 02/01/2022 Target Resolution Date: 08/20/2022 Goal Status: Active Interventions: Assess HgA1c results as ordered upon admission and as needed Assess patient nutrition upon admission and as needed per policy Provide education on elevated blood sugars and impact on wound healing Provide education on nutrition Treatment Activities: Education provided on Nutrition : 02/01/2022 Notes: Wound/Skin Impairment Nursing Diagnoses: Impaired tissue integrity Knowledge deficit related to ulceration/compromised skin integrity Goals: Patient/caregiver will verbalize understanding of skin care regimen Date Initiated: 02/01/2022 Date Inactivated: 05/18/2022 Target Resolution Date: 04/28/2022 Goal Status: Met Ulcer/skin breakdown will have a volume reduction of 30% by week 4 Date Initiated: 02/01/2022 Date Inactivated: 03/24/2022 Target Resolution Date: 03/31/2022 Goal Status: Unmet Unmet Reason: PAD Ulcer/skin breakdown will have a volume reduction of 50% by week 8 Date Initiated: 04/06/2022 Target Resolution Date: 08/20/2022 Goal Status: Active Interventions: Assess patient/caregiver ability to  obtain necessary supplies Assess patient/caregiver ability to perform ulcer/skin care regimen upon admission and as needed Assess ulceration(s) every visit Provide education on ulcer and skin care Notes: Electronic Signature(s) Signed: 06/15/2022 6:02:50 PM By: Dellie Catholic RN Entered By: Dellie Catholic on 06/15/2022 18:00:48 -------------------------------------------------------------------------------- Pain Assessment Details Patient Name: Date of Service: [name] 06/15/2022 1:15 PM Medical Record Number: [id number] Patient Account Number: [id number] Date of Birth/Sex: Treating RN: 09-26-1931 (88 y.o. [name] Primary Care Aydee Mcnew: SYSTEM, PRO Ave Filter Other Clinician: Referring Jo Booze: Treating Avaeh Ewer/Extender: Jadene Pierini in Treatment: 19 Active Problems Location of Pain Severity and Description of Pain Patient Has Paino No Site Locations Rate the pain. Rate the pain. Current Pain Level: 0 Pain Management and Medication Current Pain Management: Electronic Signature(s) Signed: 06/15/2022 4:59:32 PM By: Adline Peals Entered By: Adline Peals on 06/15/2022 13:43:11 -------------------------------------------------------------------------------- Patient/Caregiver Education Details Patient Name: Date of Service: [name] 7/26/2023andnbsp1:15 PM Medical Record Number: [id number] Patient Account Number: [id number] Date of  Birth/Gender: Treating RN: 06-Aug-1931 (88 y.o. [name] Primary Care Physician: SYSTEM, PRO V IDER Other Clinician: Referring Physician: Treating Physician/Extender: Jadene Pierini in Treatment: 57 Education Assessment Education Provided To: Patient Education Topics Provided Wound/Skin Impairment: Methods: Explain/Verbal Responses: Return demonstration correctly Electronic Signature(s) Signed: 06/15/2022 6:02:50 PM By: Dellie Catholic RN Entered By: Dellie Catholic on  06/15/2022 18:01:02 -------------------------------------------------------------------------------- Wound Assessment Details Patient Name: Date of Service: [name] 06/15/2022 1:15 PM Medical Record Number: [id number] Patient Account Number: [id number] Date of Birth/Sex: Treating RN: 09-15-1931 (88 y.o. [name] Primary Care Trashaun Streight: SYSTEM, PRO V IDER Other Clinician: Referring Stoy Fenn: Treating Tomasz Steeves/Extender: Jadene Pierini in Treatment: 19 Wound Status Wound Number: 1 Primary Diabetic Wound/Ulcer of the Lower Extremity Etiology: Wound Location: Left T Great oe Wound Open Wounding Event: Gradually Appeared Status: Date Acquired: 09/29/2021 Comorbid Arrhythmia, Congestive Heart Failure, Hypertension, Type II Weeks Of Treatment: 19 History: Diabetes, Gout, Osteoarthritis Clustered Wound: No Photos Wound Measurements Length: (cm) 0.4 Width: (cm) 0.3 Depth: (cm) 0.2 Area: (cm) 0.094 Volume: (cm) 0.019 % Reduction in Area: 66.8% % Reduction in Volume: 66.7% Epithelialization: Small (1-33%) Tunneling: No Undermining: No Wound Description Classification: Grade 2 Wound Margin: Flat and Intact Exudate Amount: Medium Exudate Type: Serous Exudate Color: amber Foul Odor After Cleansing: No Slough/Fibrino Yes Wound Bed Granulation Amount: Small (1-33%) Exposed Structure Granulation Quality: Pink Fascia Exposed: No Necrotic Amount: Large (67-100%) Fat Layer (Subcutaneous Tissue) Exposed: Yes Necrotic Quality: Adherent Slough Tendon Exposed: No Muscle Exposed: No Joint Exposed: No Bone Exposed: No Treatment Notes Wound #1 (Toe Great) Wound Laterality: Left Cleanser Wound Cleanser Discharge Instruction: Cleanse the wound with wound cleanser prior to applying a clean dressing using gauze sponges, not tissue or cotton balls. Peri-Wound Care Topical Gentamicin Discharge Instruction: As directed by physician Primary  Dressing KerraCel Ag Gelling Fiber Dressing, 2x2 in (silver alginate) Discharge Instruction: Apply silver alginate to wound bed as instructed Secondary Dressing Optifoam Non-Adhesive Dressing, 4x4 in Discharge Instruction: Apply between 1st and 2nd toes on left foot Woven Gauze Sponges 2x2 in Discharge Instruction: Apply over primary dressing as directed. Secured With Conforming Stretch Gauze Bandage, Sterile 2x75 (in/in) Discharge Instruction: Secure with stretch gauze as directed. Paper Tape, 1x10 (in/yd) Discharge Instruction: Secure dressing with tape as directed. Compression Wrap Compression Stockings Add-Ons Electronic Signature(s) Signed: 06/15/2022 4:59:32 PM By: Adline Peals Signed: 06/15/2022 6:02:50 PM By: Dellie Catholic RN Entered By: Dellie Catholic on 06/15/2022 13:58:42 -------------------------------------------------------------------------------- Vitals Details Patient Name: Date of Service: [name], [name] Camps. 06/15/2022 1:15 PM Medical Record Number: [id number] Patient Account Number: [id number] Date of Birth/Sex: Treating RN: 19-Mar-1931 (88 y.o. [name] Primary Care Tej Murdaugh: SYSTEM, PRO V IDER Other Clinician: Referring Shiasia Porro: Treating Jashay Roddy/Extender: Jadene Pierini in Treatment: 19 Vital Signs Time Taken: 13:42 Temperature (F): 98.2 Pulse (bpm): 84 Respiratory Rate (breaths/min): 18 Blood Pressure (mmHg): 111/64 Reference Range: 80 - 120 mg / dl Electronic Signature(s) Signed: 06/15/2022 4:59:32 PM By: Adline Peals Entered By: Adline Peals on 06/15/2022 13:43:05

## 2022-06-15 NOTE — Progress Notes (Signed)
[name], [name] ([id number]) Visit Report for 06/15/2022 Chief Complaint Document Details Patient Name: Date of Service: [name], [name] 06/15/2022 1:15 PM Medical Record Number: [id number] Patient Account Number: [id number] Date of Birth/Sex: Treating RN: Dec 27, 1930 (88 y.o. [name] Primary Care Provider: SYSTEM, PRO Rodena Goldmann Other Clinician: Referring Provider: Treating Provider/Extender: [name] in Treatment: 19 Information Obtained from: Patient Chief Complaint Patient presents to the wound care center with open non-healing surgical wound(s) Electronic Signature(s) Signed: 06/15/2022 2:04:01 PM By: Duanne Guess MD FACS Entered By: Duanne Guess on 06/15/2022 14:04:00 -------------------------------------------------------------------------------- Debridement Details Patient Name: Date of Service: [name]. 06/15/2022 1:15 PM Medical Record Number: [id number] Patient Account Number: [id number] Date of Birth/Sex: Treating RN: 1930-12-21 (88 y.o. [name] Primary Care Provider: SYSTEM, PRO V IDER Other Clinician: Referring Provider: Treating Provider/Extender: [name] in Treatment: 19 Debridement Performed for Assessment: Wound #1 Left T Great oe Performed By: Physician Duanne Guess, MD Debridement Type: Debridement Severity of Tissue Pre Debridement: Fat layer exposed Level of Consciousness (Pre-procedure): Awake and Alert Pre-procedure Verification/Time Out Yes - 13:55 Taken: Start Time: 13:55 Pain Control: Lidocaine 4% T opical Solution T Area Debrided (L x W): otal 0.4 (cm) x 0.3 (cm) = 0.12 (cm) Tissue and other material debrided: Non-Viable, Slough, Slough Level: Non-Viable Tissue Debridement Description: Selective/Open Wound Instrument: Curette Bleeding: Minimum Hemostasis Achieved: Pressure End Time: 13:57 Procedural Pain: 0 Post Procedural Pain: 0 Response to Treatment: Procedure was  tolerated well Level of Consciousness (Post- Awake and Alert procedure): Post Debridement Measurements of Total Wound Length: (cm) 0.4 Width: (cm) 0.3 Depth: (cm) 0.2 Volume: (cm) 0.019 Character of Wound/Ulcer Post Debridement: Improved Severity of Tissue Post Debridement: Fat layer exposed Post Procedure Diagnosis Same as Pre-procedure Electronic Signature(s) Signed: 06/15/2022 2:58:48 PM By: Duanne Guess MD FACS Signed: 06/15/2022 6:02:50 PM By: Karie Schwalbe RN Entered By: Karie Schwalbe on 06/15/2022 14:00:02 -------------------------------------------------------------------------------- HPI Details Patient Name: Date of Service: [name]. 06/15/2022 1:15 PM Medical Record Number: [id number] Patient Account Number: [id number] Date of Birth/Sex: Treating RN: Oct 09, 1931 (88 y.o. [name] Primary Care Provider: SYSTEM, PRO Rodena Goldmann Other Clinician: Referring Provider: Treating Provider/Extender: [name] in Treatment: 19 History of Present Illness HPI Description: ADMISSION 02/01/2022 This is an 88 year old woman who is a resident of [place]. She has a past medical history notable for type 2 diabetes mellitus, congestive heart failure, pulmonary hypertension, and complete heart block. She is accompanied today by her son. Approximately 4 months ago, she had an ingrown toe nail removed. Apparently this went a bit deep. Since that time, it has not healed. The patient is nonambulatory. Apparently they have been applying Santyl to the site at her facility. She was on antibiotics but I am not certain which specific drugs she took. She completed her last course a few weeks ago. We were unable to obtain ABIs in clinic secondary to the patient's body habitus. She has not had any formal vascular studies nor any x-ray of the area. 02/09/2022: The wound is roughly the same size today, but the amount of slough in the base has decreased significantly. She  apparently had an x-ray done at her facility, but we do not have the report; per verbal report from facility staff, "there was nothing there." We had requested formal ABI/TBI studies, but these were not done. She has been in [place] under [employer] [facility]	[place]. 02/16/2022: The wound measured about the same today, but to my eye, it  appears rather smaller. The slough in the base is minimal. Her vascular studies are scheduled for Friday. We have been using Santyl under Hydrofera Blue. No concern for infection. 02/23/2022: Once again, the wound has been measured about the same size, but I think it looks a bit smaller. There is slough at the base. She had her ABIs done last week and these show severe bilateral peripheral arterial disease. We received the report of the foot x-ray that was ordered. There is no concern for osteomyelitis at this time. 03/02/2022: The wound is smaller today with a small amount of slough at the base. We placed a referral to vein and vascular surgery last week in response to her severe peripheral arterial disease, but she does not yet have an appointment on the books. 03/16/2022: She saw Dr. Lenell Antu in the vascular surgery department yesterday. He did not think she was a good candidate for any revascularization options and suggested that she continue local wound care for another month. If she fails to heal in that time, they may consider an angiogram with potential intervention. The wound today is roughly the same size but there is less exposed bone at the base and the surface is clean without any substantial slough. 03/24/2022: The wound appears about the same. There is some periwound callus that has heaped up. We changed her to Prisma silver collagen last week. 04/06/2022: No significant change to the wound. It is neither improving nor deteriorating. She continues to accumulate some periwound callus around the margin. Bone is exposed at the base. 04/20/2022: The wound has improved quite a bit  over the past 2 weeks. It is smaller and there is no real accumulation of periwound callus. She had follow-up with Dr. Lenell Antu yesterday and he felt that the wound was progressing well enough that no arterial intervention was necessary. 05/18/2022: The patient has been absent due to a fall from her wheelchair during her last transportation that was supposed to be to her clinic. Today, the wound is about the same size. There is still bone exposed at the base. There is periwound callus accumulation. No concern for infection. 06/01/2022: The wound is actually a little bit smaller today and the surface is not as pale. She still has accumulated periwound callus. 06/15/2022: The wound continues to color tract, albeit slowly. No concern for infection. Electronic Signature(s) Signed: 06/15/2022 2:04:27 PM By: Duanne Guess MD FACS Entered By: Duanne Guess on 06/15/2022 14:04:27 -------------------------------------------------------------------------------- Physical Exam Details Patient Name: Date of Service: [name] 06/15/2022 1:15 PM Medical Record Number: [id number] Patient Account Number: [id number] Date of Birth/Sex: Treating RN: 1931-02-08 (88 y.o. [name] Primary Care Provider: SYSTEM, PRO Rodena Goldmann Other Clinician: Referring Provider: Treating Provider/Extender: [name] in Treatment: 19 Constitutional . . . . No acute distress.[name] Respiratory Normal work of breathing on room air.. Notes 06/15/2022: The wound continues to color tract, albeit slowly. No concern for infection. Electronic Signature(s) Signed: 06/15/2022 2:04:56 PM By: Duanne Guess MD FACS Entered By: Duanne Guess on 06/15/2022 14:04:56 -------------------------------------------------------------------------------- Physician Orders Details Patient Name: Date of Service: [name], [name] 06/15/2022 1:15 PM Medical Record Number: [id number] Patient Account Number: [id number] Date of  Birth/Sex: Treating RN: [birth year] (88 y.o. 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 - Dr. Lady Gary - Room 3 Wednesday August 9th at 3:15pm Return A Bathing/ Shower/ Hygiene May shower with protection but do not get wound dressing(s) wet. Off-Loading Other: - No direct pressure on toes Wound Treatment Wound #1 - T Great oe Wound Laterality: Left Cleanser: Wound Cleanser Every Other Day/30 Days Discharge Instructions: Cleanse the wound with wound cleanser prior to applying a clean dressing using gauze sponges, not tissue or cotton balls. Topical: Gentamicin Every Other Day/30 Days Discharge Instructions: As directed by physician Prim Dressing: KerraCel Ag Gelling Fiber Dressing, 2x2 in (silver alginate) ary Every Other Day/30 Days Discharge Instructions: Apply silver alginate to wound bed as instructed Secondary Dressing: Optifoam Non-Adhesive Dressing, 4x4 in Every Other Day/30 Days Discharge Instructions: Apply between 1st and 2nd toes on left foot Secondary Dressing: Woven Gauze Sponges 2x2 in Every Other Day/30 Days Discharge Instructions: Apply over primary dressing as directed. Secured With: Insurance underwriter, Sterile 2x75 (in/in) Every Other Day/30 Days Discharge Instructions: Secure with stretch gauze as directed. Secured With: Paper Tape, 1x10 (in/yd) Every Other Day/30 Days Discharge Instructions: Secure dressing with tape as directed. Electronic Signature(s) Signed: 06/15/2022 2:58:48 PM  By: Duanne Guess MD FACS Entered By: Duanne Guess on 06/15/2022 14:05:09 -------------------------------------------------------------------------------- Problem List Details Patient Name: Date of Service: [name], [name] 06/15/2022 1:15 PM Medical Record Number: [id number] Patient Account Number: [id number] Date of Birth/Sex: Treating RN: 1931-02-21 (88 y.o. [name] Primary Care Provider: SYSTEM, PRO Rodena Goldmann Other Clinician: Referring Provider: Treating Provider/Extender: [name] in Treatment: 19 Active Problems ICD-10 Encounter Code Description Active Date MDM Diagnosis E11.621 Type 2 diabetes mellitus with foot ulcer 02/01/2022 No Yes L97.522 Non-pressure chronic ulcer of other part of left foot with fat layer exposed 02/01/2022 No Yes I50.32 Chronic diastolic (congestive) heart failure 02/01/2022 No Yes I27.20 Pulmonary hypertension, unspecified 02/01/2022 No Yes Inactive Problems Resolved Problems Electronic Signature(s) Signed: 06/15/2022 2:01:38 PM By: Duanne Guess MD FACS Entered By: Duanne Guess on 06/15/2022 14:01:38 -------------------------------------------------------------------------------- Progress Note Details Patient Name: Date of Service: [name]. 06/15/2022 1:15 PM Medical Record Number: [id number] Patient Account Number: [id number] Date of Birth/Sex: Treating RN: [birth year] (88 y.o. [name] Primary Care Provider: SYSTEM, PRO Rodena Goldmann Other Clinician: Referring Provider: Treating Provider/Extender: [name] in Treatment: 19 Subjective Chief Complaint Information obtained from Patient Patient presents to the wound care center with open non-healing surgical wound(s) History of Present Illness (HPI) ADMISSION 02/01/2022 This is an 88 year old woman who is a resident of [place]. She has a past medical history notable for type 2 diabetes mellitus, congestive heart  failure, pulmonary hypertension, and complete heart block. She is accompanied today by her son. Approximately 4 months ago, she had an ingrown toe nail removed. Apparently this went a bit deep. Since that time, it has not healed. The patient is nonambulatory. Apparently they have been applying Santyl to the site at her facility. She was on antibiotics but I am not certain which specific drugs she took. She completed her last course a few weeks ago. We were unable to obtain ABIs in clinic secondary to the patient's body habitus. She has not had any formal vascular studies nor any x-ray of the area. 02/09/2022: The wound is roughly the same size today, but the amount of slough in the base has decreased significantly. She  apparently had an x-ray done at her facility, but we do not have the report; per verbal report from facility staff, "there was nothing there." We had requested formal ABI/TBI studies, but these were not done. She has been in [place] under [facility]	[place]. 02/16/2022: The wound measured about the same today, but to my eye, it appears rather smaller. The slough in the base is minimal. Her vascular studies are scheduled for Friday. We have been using Santyl under Hydrofera Blue. No concern for infection. 02/23/2022: Once again, the wound has been measured about the same size, but I think it looks a bit smaller. There is slough at the base. She had her ABIs done last week and these show severe bilateral peripheral arterial disease. We received the report of the foot x-ray that was ordered. There is no concern for osteomyelitis at this time. 03/02/2022: The wound is smaller today with a small amount of slough at the base. We placed a referral to vein and vascular surgery last week in response to her severe peripheral arterial disease, but she does not yet have an appointment on the books. 03/16/2022: She saw Dr. Lenell Antu in the vascular surgery department yesterday. He did not think she was a good  candidate for any revascularization options and suggested that she continue local wound care for another month. If she fails to heal in that time, they may consider an angiogram with potential intervention. The wound today is roughly the same size but there is less exposed bone at the base and the surface is clean without any substantial slough. 03/24/2022: The wound appears about the same. There is some periwound callus that has heaped up. We changed her to Prisma silver collagen last week. 04/06/2022: No significant change to the wound. It is neither improving nor deteriorating. She continues to accumulate some periwound callus around the margin. Bone is exposed at the base. 04/20/2022: The wound has improved quite a bit over the past 2 weeks. It is smaller and there is no real accumulation of periwound callus. She had follow-up with Dr. Lenell Antu yesterday and he felt that the wound was progressing well enough that no arterial intervention was necessary. 05/18/2022: The patient has been absent due to a fall from her wheelchair during her last transportation that was supposed to be to her clinic. Today, the wound is about the same size. There is still bone exposed at the base. There is periwound callus accumulation. No concern for infection. 06/01/2022: The wound is actually a little bit smaller today and the surface is not as pale. She still has accumulated periwound callus. 06/15/2022: The wound continues to color tract, albeit slowly. No concern for infection. Patient History Social History Never smoker, Marital Status - Widowed, Alcohol Use - Never, Drug Use - No History, Caffeine Use - Rarely. Medical History Cardiovascular Patient has history of Arrhythmia - AV block, complete heart block, Congestive Heart Failure, Hypertension Endocrine Patient has history of Type II Diabetes Musculoskeletal Patient has history of Gout, Osteoarthritis Medical A Surgical History  Notes nd Cardiovascular Pulmonary Hypertension Endocrine Hypoglycemia Neurologic Acute encephalopathy Objective Constitutional No acute distress.. Vitals Time Taken: 1:42 PM, Temperature: 98.2 F, Pulse: 84 bpm, Respiratory Rate: 18 breaths/min, Blood Pressure: 111/64 mmHg. Respiratory Normal work of breathing on room air.. General Notes: 06/15/2022: The wound continues to color tract, albeit slowly. No concern for infection. Integumentary (Hair, Skin) Wound #1 status is Open. Original cause of wound was Gradually Appeared. The date acquired was: 09/29/2021. The wound has been  in treatment 19 weeks. The wound is located on the Left T Great. The wound measures 0.4cm length x 0.3cm width x 0.2cm depth; 0.094cm^2 area and 0.019cm^3 volume. There is Fat oe Layer (Subcutaneous Tissue) exposed. There is no tunneling or undermining noted. There is a medium amount of serous drainage noted. The wound margin is flat and intact. There is small (1-33%) pink granulation within the wound bed. There is a large (67-100%) amount of necrotic tissue within the wound bed including Adherent Slough. 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 There was a Selective/Open Wound Non-Viable Tissue Debridement with a total area of 0.12 sq cm performed by Duanne Guess, MD. With the following instrument(s): Curette to remove Non-Viable tissue/material. Material removed includes [facility] after achieving pain control using Lidocaine 4% T opical Solution. No specimens were taken. A time out was conducted at 13:55, prior to the start of  the procedure. A Minimum amount of bleeding was controlled with Pressure. The procedure was tolerated well with a pain level of 0 throughout and a pain level of 0 following the procedure. Post Debridement Measurements: 0.4cm length x 0.3cm width x 0.2cm depth; 0.019cm^3 volume. Character of Wound/Ulcer Post Debridement is improved. Severity of Tissue Post Debridement is: Fat layer exposed. Post procedure Diagnosis Wound #1: Same as Pre-Procedure Plan Follow-up Appointments: Return Appointment in 2 weeks. - Dr. Lady Gary - Room 3 Wednesday August 9th at 3:15pm Bathing/ Shower/ Hygiene: May shower with protection but do not get wound dressing(s) wet. Off-Loading: Other: - No direct pressure on toes WOUND #1: - T Great Wound Laterality: Left oe Cleanser: Wound Cleanser Every Other Day/30 Days Discharge Instructions: Cleanse the wound with wound cleanser prior to applying a clean dressing using gauze sponges, not tissue or cotton balls. Topical: Gentamicin Every Other Day/30 Days Discharge Instructions: As directed by physician Prim Dressing: KerraCel Ag Gelling Fiber Dressing, 2x2 in (silver alginate) Every Other Day/30 Days ary Discharge Instructions: Apply silver alginate to wound bed as instructed Secondary Dressing: Optifoam Non-Adhesive Dressing, 4x4 in Every Other Day/30 Days Discharge Instructions: Apply between 1st and 2nd toes on left foot Secondary Dressing: Woven Gauze Sponges 2x2 in Every Other Day/30 Days Discharge Instructions: Apply over primary dressing as directed. Secured With: Insurance underwriter, Sterile 2x75 (in/in) Every Other Day/30 Days Discharge Instructions: Secure with stretch gauze as directed. Secured With: Paper T ape, 1x10 (in/yd) Every Other Day/30 Days Discharge Instructions: Secure dressing with tape as directed. 06/15/2022: The wound continues to color tract, albeit slowly. No concern for infection. I used a curette to debride slough from the  wound surface. We will continue using topical gentamicin with silver alginate. She will follow-up in 2 weeks. Electronic Signature(s) Signed: 06/15/2022 2:14:37 PM By: Duanne Guess MD FACS Entered By: Duanne Guess on 06/15/2022 14:14:37 -------------------------------------------------------------------------------- HxROS Details Patient Name: Date of Service: [name], [name]. 06/15/2022 1:15 PM Medical Record Number: [id number] Patient Account Number: [id number] Date of Birth/Sex: Treating RN: [birth year] (88 y.o. [name] Primary Care Provider: SYSTEM, PRO Rodena Goldmann Other Clinician: Referring Provider: Treating Provider/Extender: [name] in Treatment: 19 Cardiovascular Medical History: Positive for: Arrhythmia - AV block, complete heart  block; Congestive Heart Failure; Hypertension Past Medical History Notes: Pulmonary Hypertension Endocrine Medical History: Positive for: Type II Diabetes Past Medical History Notes: Hypoglycemia Treated with: Oral agents Blood sugar tested every day: Yes Tested : 2-3x a day Musculoskeletal Medical History: Positive for: Gout; Osteoarthritis Neurologic Medical History: Past Medical History Notes: Acute encephalopathy Immunizations Pneumococcal Vaccine: Received Pneumococcal Vaccination: No Implantable Devices No devices added Family and Social History Never smoker; Marital Status - Widowed; Alcohol Use: Never; Drug Use: No History; Caffeine Use: Rarely; Financial Concerns: No; Food, Clothing or Shelter Needs: No; Support System Lacking: No; Transportation Concerns: No Electronic Signature(s) Signed: 06/15/2022 2:58:48 PM By: Duanne Guess MD FACS Signed: 06/15/2022 6:02:50 PM By: Karie Schwalbe RN Entered By: Duanne Guess on 06/15/2022 14:04:33 -------------------------------------------------------------------------------- SuperBill Details Patient Name: Date of Service: [name]  06/15/2022 Medical Record Number: [id number] Patient Account Number: [id number] Date of Birth/Sex: Treating RN: [birth year] (88 y.o. [name] Primary Care Provider: SYSTEM, PRO V IDER Other Clinician: Referring Provider: Treating Provider/Extender: [name] in Treatment: 19 Diagnosis Coding ICD-10 Codes Code Description E11.621 Type 2 diabetes mellitus with foot ulcer L97.522 Non-pressure chronic ulcer of other part of left foot with fat layer exposed I50.32 Chronic diastolic (congestive) heart failure I27.20 Pulmonary hypertension, unspecified Facility Procedures CPT4 Code: [id number] Description: [id number] - DEBRIDE WOUND 1ST 20 SQ CM OR < ICD-10 Diagnosis Description L97.522 Non-pressure chronic ulcer of other part of left foot with fat layer exposed Modifier: Quantity: 1 Physician Procedures : CPT4 Code Description Modifier [id number] - WC PHYS LEVEL 4 - EST PT 25 ICD-10 Diagnosis Description L97.522 Non-pressure chronic ulcer of other part of left foot with fat layer exposed E11.621 Type 2 diabetes mellitus with foot ulcer I27.20  Pulmonary hypertension, unspecified I50.32 Chronic diastolic (congestive) heart failure Quantity: 1 : [id number] - WC PHYS DEBR WO ANESTH 20 SQ CM ICD-10 Diagnosis Description L97.522 Non-pressure chronic ulcer of other part of left foot with fat layer exposed Quantity: 1 Electronic Signature(s) Signed: 06/15/2022 2:14:59 PM By: Duanne Guess MD FACS Entered By: Duanne Guess on 06/15/2022 14:14:59

## 2022-06-28 ENCOUNTER — Encounter (HOSPITAL_BASED_OUTPATIENT_CLINIC_OR_DEPARTMENT_OTHER): Payer: Medicare Other | Attending: General Surgery | Admitting: General Surgery

## 2022-06-28 DIAGNOSIS — E11621 Type 2 diabetes mellitus with foot ulcer: Secondary | ICD-10-CM | POA: Diagnosis not present

## 2022-06-28 DIAGNOSIS — I5032 Chronic diastolic (congestive) heart failure: Secondary | ICD-10-CM | POA: Insufficient documentation

## 2022-06-28 DIAGNOSIS — L97522 Non-pressure chronic ulcer of other part of left foot with fat layer exposed: Secondary | ICD-10-CM | POA: Diagnosis not present

## 2022-06-28 DIAGNOSIS — I272 Pulmonary hypertension, unspecified: Secondary | ICD-10-CM | POA: Diagnosis not present

## 2022-06-28 NOTE — Progress Notes (Signed)
[name], [name] ([id number]) Visit Report for 06/28/2022 Arrival Information Details Patient Name: Date of Service: [name], [name] 06/28/2022 12:30 PM Medical Record Number: [id number] Patient Account Number: [id number] Date of Birth/Sex: Treating RN: 1931/10/06 (88 y.o. [name], [name] Primary Care Breon Rehm: SYSTEM, PRO V IDER Other Clinician: Referring Lomax Poehler: Treating Nafeesah Lapaglia/Extender: Jadene Pierini in Treatment: 21 Visit Information History Since Last Visit Added or deleted any medications: No Patient Arrived: Wheel Chair Any new allergies or adverse reactions: No Arrival Time: 12:42 Had a fall or experienced change in No Accompanied By: son activities of daily living that may affect Transfer Assistance: Manual risk of falls: Patient Identification Verified: Yes Signs or symptoms of abuse/neglect since last visito No Patient Requires Transmission-Based Precautions: No Hospitalized since last visit: No Patient Has Alerts: Yes Implantable device outside of the clinic excluding No Patient Alerts: ABI not obtainable cellular tissue based products placed in the center since last visit: Has Dressing in Place as Prescribed: Yes Pain Present Now: No Electronic Signature(s) Signed: 06/28/2022 5:40:56 PM By: Dellie Catholic RN Entered By: Dellie Catholic on 06/28/2022 12:42:50 -------------------------------------------------------------------------------- Encounter Discharge Information Details Patient Name: Date of Service: [name], [name] Camps. 06/28/2022 12:30 PM Medical Record Number: [id number] Patient Account Number: [id number] Date of Birth/Sex: Treating RN: 02/06/1931 (88 y.o. [name] Primary Care Crockett Rallo: SYSTEM, PRO Ave Filter Other Clinician: Referring Zakaree Mcclenahan: Treating Melena Hayes/Extender: Jadene Pierini in Treatment: 21 Encounter Discharge Information Items Post Procedure Vitals Discharge Condition: Stable Temperature (F):  98.1 Ambulatory Status: Wheelchair Pulse (bpm): 81 Discharge Destination: [place] Respiratory Rate (breaths/min): 16 Telephoned: No Blood Pressure (mmHg): 126/71 Orders Sent: Yes Transportation: Private Auto Accompanied By: son Schedule Follow-up Appointment: Yes Clinical Summary of Care: Patient Declined Notes From [employer]) Signed: 06/28/2022 5:40:56 PM By: Dellie Catholic RN Entered By: Dellie Catholic on 06/28/2022 17:21:44 -------------------------------------------------------------------------------- Lower Extremity Assessment Details Patient Name: Date of Service: [name] 06/28/2022 12:30 PM Medical Record Number: [id number] Patient Account Number: [id number] Date of Birth/Sex: Treating RN: November 27, 1930 (88 y.o. [name] Primary Care Asmar Brozek: SYSTEM, PRO Ave Filter Other Clinician: Referring Damar Petit: Treating Virgle Arth/Extender: Jadene Pierini in Treatment: 21 Edema Assessment Assessed: [Left: No] [Right: No] Edema: [Left: Ye] [Right: s] Calf Left: Right: Point of Measurement: 26 cm From Medial Instep 39.5 cm Ankle Left: Right: Point of Measurement: 10 cm From Medial Instep 25.2 cm Electronic Signature(s) Signed: 06/28/2022 5:40:56 PM By: Dellie Catholic RN Entered By: Dellie Catholic on 06/28/2022 12:45:16 -------------------------------------------------------------------------------- Multi Wound Chart Details Patient Name: Date of Service: [name] Gable. 06/28/2022 12:30 PM Medical Record Number: [id number] Patient Account Number: [id number] Date of Birth/Sex: Treating RN: 1930-12-12 (88 y.o. F) Primary Care Jaimey Franchini: SYSTEM, PRO V IDER Other Clinician: Referring Mykah Bellomo: Treating Ahmya Bernick/Extender: Jadene Pierini in Treatment: 21 Vital Signs Height(in): Pulse(bpm): 81 Weight(lbs): Blood Pressure(mmHg): 126/71 Body Mass Index(BMI): Temperature(F):  98.1 Respiratory Rate(breaths/min): 16 Photos: [1:No Photos Left T Great oe] [N/A:N/A N/A] Wound Location: [1:Gradually Appeared] [N/A:N/A] Wounding Event: [1:Diabetic Wound/Ulcer of the Lower] [N/A:N/A] Primary Etiology: [1:Extremity Arrhythmia, Congestive Heart Failure,] [N/A:N/A] Comorbid History: [1:Hypertension, Type II Diabetes, Gout, Osteoarthritis 09/29/2021] [N/A:N/A] Date Acquired: [1:21] [N/A:N/A] Weeks of Treatment: [1:Open] [N/A:N/A] Wound Status: [1:No] [N/A:N/A] Wound Recurrence: [1:0.3x0.2x0.2] [N/A:N/A] Measurements L x W x D (cm) [1:0.047] [N/A:N/A] A (cm) : rea [1:0.009] [N/A:N/A] Volume (cm) : [1:83.40%] [N/A:N/A] % Reduction in A rea: [1:84.20%] [N/A:N/A] % Reduction in Volume: [1:Grade 2] [N/A:N/A] Classification: [1:Medium] [N/A:N/A] Exudate A mount: [1:Serous] [  N/A:N/A] Exudate Type: [1:amber] [N/A:N/A] Exudate Color: [1:Flat and Intact] [N/A:N/A] Wound Margin: [1:Small (1-33%)] [N/A:N/A] Granulation A mount: [1:Pink] [N/A:N/A] Granulation Quality: [1:Large (67-100%)] [N/A:N/A] Necrotic A mount: [1:Fat Layer (Subcutaneous Tissue): Yes N/A] Exposed Structures: [1:Fascia: No Tendon: No Muscle: No Joint: No Bone: No Small (1-33%)] [N/A:N/A] Epithelialization: [1:Debridement - Selective/Open Wound N/A] Debridement: Pre-procedure Verification/Time Out 12:50 [N/A:N/A] Taken: [1:Lidocaine 4% Topical Solution] [N/A:N/A] Pain Control: [1:Callus] [N/A:N/A] Tissue Debrided: [1:Non-Viable Tissue] [N/A:N/A] Level: [1:0.06] [N/A:N/A] Debridement A (sq cm): [1:rea Curette] [N/A:N/A] Instrument: [1:Minimum] [N/A:N/A] Bleeding: [1:Pressure] [N/A:N/A] Hemostasis A chieved: [1:0] [N/A:N/A] Procedural Pain: [1:0] [N/A:N/A] Post Procedural Pain: [1:Procedure was tolerated well] [N/A:N/A] Debridement Treatment Response: [1:0.3x0.2x0.2] [N/A:N/A] Post Debridement Measurements L x W x D (cm) [1:0.009] [N/A:N/A] Post Debridement Volume: (cm) [1:Debridement]  [N/A:N/A] Treatment Notes Electronic Signature(s) Signed: 06/28/2022 1:10:09 PM By: Fredirick Maudlin MD FACS Entered By: Fredirick Maudlin on 06/28/2022 13:10:08 -------------------------------------------------------------------------------- Multi-Disciplinary Care Plan Details Patient Name: Date of Service: [name] Gable. 06/28/2022 12:30 PM Medical Record Number: [id number] Patient Account Number: [id number] Date of Birth/Sex: Treating RN: 10/15/1931 (88 y.o. [name] Primary Care Laquana Villari: SYSTEM, PRO Ave Filter Other Clinician: Referring Allia Wiltsey: Treating Nareh Matzke/Extender: Jadene Pierini in Treatment: 21 Multidisciplinary Care Plan reviewed with physician Active Inactive Nutrition Nursing Diagnoses: Impaired glucose control: actual or potential Potential for alteratiion in Nutrition/Potential for imbalanced nutrition Goals: Patient/caregiver will maintain therapeutic glucose control Date Initiated: 02/01/2022 Target Resolution Date: 08/20/2022 Goal Status: Active Interventions: Assess HgA1c results as ordered upon admission and as needed Assess patient nutrition upon admission and as needed per policy Provide education on elevated blood sugars and impact on wound healing Provide education on nutrition Treatment Activities: Education provided on Nutrition : 02/01/2022 Notes: Wound/Skin Impairment Nursing Diagnoses: Impaired tissue integrity Knowledge deficit related to ulceration/compromised skin integrity Goals: Patient/caregiver will verbalize understanding of skin care regimen Date Initiated: 02/01/2022 Date Inactivated: 05/18/2022 Target Resolution Date: 04/28/2022 Goal Status: Met Ulcer/skin breakdown will have a volume reduction of 30% by week 4 Date Initiated: 02/01/2022 Date Inactivated: 03/24/2022 Target Resolution Date: 03/31/2022 Goal Status: Unmet Unmet Reason: PAD Ulcer/skin breakdown will have a volume reduction of 50% by week 8 Date  Initiated: 04/06/2022 Target Resolution Date: 08/20/2022 Goal Status: Active Interventions: Assess patient/caregiver ability to obtain necessary supplies Assess patient/caregiver ability to perform ulcer/skin care regimen upon admission and as needed Assess ulceration(s) every visit Provide education on ulcer and skin care Notes: Electronic Signature(s) Signed: 06/28/2022 5:40:56 PM By: Dellie Catholic RN Entered By: Dellie Catholic on 06/28/2022 12:54:45 -------------------------------------------------------------------------------- Pain Assessment Details Patient Name: Date of Service: [name] Gable 06/28/2022 12:30 PM Medical Record Number: [id number] Patient Account Number: [id number] Date of Birth/Sex: Treating RN: [birth year] (88 y.o. [name] Primary Care [name]: SYSTEM, PRO Ave Filter Other Clinician: Referring Dalissa Lovin: Treating Jeyden Coffelt/Extender: Jadene Pierini in Treatment: 21 Active Problems Location of Pain Severity and Description of Pain Patient Has Paino No Site Locations Pain Management and Medication Current Pain Management: Electronic Signature(s) Signed: 06/28/2022 5:40:56 PM By: Dellie Catholic RN Entered By: Dellie Catholic on 06/28/2022 12:45:10 -------------------------------------------------------------------------------- Patient/Caregiver Education Details Patient Name: Date of Service: [name] Gable 8/8/2023andnbsp12:30 PM Medical Record Number: [id number] Patient Account Number: [id number] Date of Birth/Gender: Treating RN: Feb 13, 1931 (88 y.o. [name] Primary Care Physician: SYSTEM, PRO V IDER Other Clinician: Referring Physician: Treating Physician/Extender: Jadene Pierini in Treatment: 21 Education Assessment Education Provided To: Patient Education Topics Provided Wound/Skin Impairment: Methods: Explain/Verbal Responses: Return demonstration correctly Electronic Signature(s) Signed: 06/28/2022  5:40:56 PM By: Dellie Catholic RN Entered By: Dellie Catholic on 06/28/2022 12:55:06 -------------------------------------------------------------------------------- Wound Assessment Details Patient Name: Date of Service: [name], [name] 06/28/2022 12:30 PM Medical Record Number: [id number] Patient Account Number: [id number] Date of Birth/Sex: Treating RN: [birth year] (88 y.o. [name] Primary Care Nikala Walsworth: SYSTEM, PRO V IDER Other Clinician: Referring Nada Godley: Treating Keyshawn Hellwig/Extender: Jadene Pierini in Treatment: 21 Wound Status Wound Number: 1 Primary Diabetic Wound/Ulcer of the Lower Extremity Etiology: Wound Location: Left T Great oe Wound Open Wounding Event: Gradually Appeared Status: Date Acquired: 09/29/2021 Comorbid Arrhythmia, Congestive Heart Failure, Hypertension, Type II Weeks Of Treatment: 21 History: Diabetes, Gout, Osteoarthritis Clustered Wound: No Wound Measurements Length: (cm) 0.3 Width: (cm) 0.2 Depth: (cm) 0.2 Area: (cm) 0.047 Volume: (cm) 0.009 Wound Description Classification: Grade 2 Wound Margin: Flat and Intact Exudate Amount: Medium Exudate Type: Serous Exudate Color: amber Foul Odor After Cleansing: Slough/Fibrino % Reduction in Area: 83.4% % Reduction in Volume: 84.2% Epithelialization: Small (1-33%) Tunneling: No Undermining: No No Yes Wound Bed Granulation Amount: Small (1-33%) Exposed Structure Granulation Quality: Pink Fascia Exposed: No Necrotic Amount: Large (67-100%) Fat Layer (Subcutaneous Tissue) Exposed: Yes Necrotic Quality: Adherent Slough Tendon Exposed: No Muscle Exposed: No Joint Exposed: No Bone Exposed: No Treatment Notes Wound #1 (Toe Great) Wound Laterality: Left Cleanser Wound Cleanser Discharge Instruction: Cleanse the wound with wound cleanser prior to applying a clean dressing using gauze sponges, not tissue or cotton balls. Peri-Wound Care Topical Gentamicin Discharge  Instruction: As directed by physician Primary Dressing Promogran Prisma Matrix, 4.34 (sq in) (silver collagen) Discharge Instruction: Moisten collagen with saline or hydrogel Optifoam Non-Adhesive Dressing, 4x4 in Discharge Instruction: Apply to Left Great toe to protect toe Secondary Dressing Woven Gauze Sponges 2x2 in Discharge Instruction: Apply over primary dressing as directed. Secured With Conforming Stretch Gauze Bandage, Sterile 2x75 (in/in) Discharge Instruction: Secure with stretch gauze as directed. Paper Tape, 1x10 (in/yd) Discharge Instruction: Secure dressing with tape as directed. Compression Wrap Compression Stockings Add-Ons Electronic Signature(s) Signed: 06/28/2022 5:40:56 PM By: Dellie Catholic RN Entered By: Dellie Catholic on 06/28/2022 12:48:08 -------------------------------------------------------------------------------- Vitals Details Patient Name: Date of Service: [name], [name] Camps. 06/28/2022 12:30 PM Medical Record Number: [id number] Patient Account Number: [id number] Date of Birth/Sex: Treating RN: 1931/10/19 (88 y.o. [name] Dengel Primary Care Brooklynne Pereida: SYSTEM, PRO V IDER Other Clinician: Referring Deanne Bedgood: Treating Shawnna Pancake/Extender: Jadene Pierini in Treatment: 21 Vital Signs Time Taken: 12:42 Temperature (F): 98.1 Pulse (bpm): 81 Respiratory Rate (breaths/min): 16 Blood Pressure (mmHg): 126/71 Reference Range: 80 - 120 mg / dl Electronic Signature(s) Signed: 06/28/2022 5:40:56 PM By: Dellie Catholic RN Entered By: Dellie Catholic on 06/28/2022 12:45:04

## 2022-06-28 NOTE — Progress Notes (Signed)
[name], [name] ([id number]) Visit Report for 06/28/2022 Chief Complaint Document Details Patient Name: Date of Service: [name], [name] 06/28/2022 12:30 PM Medical Record Number: [id number] Patient Account Number: [id number] Date of Birth/Sex: Treating RN: 01/27/1931 (88 y.o. F) Primary Care Provider: SYSTEM, PRO V IDER Other Clinician: Referring Provider: Treating Provider/Extender: [name] in Treatment: 21 Information Obtained from: Patient Chief Complaint Patient presents to the wound care center with open non-healing surgical wound(s) Electronic Signature(s) Signed: 06/28/2022 1:10:17 PM By: [name] Guess MD FACS Entered By: [name] on 06/28/2022 13:10:17 -------------------------------------------------------------------------------- Debridement Details Patient Name: Date of Service: [name]. 06/28/2022 12:30 PM Medical Record Number: [id number] Patient Account Number: [id number] Date of Birth/Sex: Treating RN: 1931-02-24 (88 y.o. [name] Primary Care Provider: SYSTEM, PRO V IDER Other Clinician: Referring Provider: Treating Provider/Extender: [name] in Treatment: 21 Debridement Performed for Assessment: Wound #1 Left T Great oe Performed By: Physician [name] Guess, MD Debridement Type: Debridement Severity of Tissue Pre Debridement: Fat layer exposed Level of Consciousness (Pre-procedure): Responds to Painful Stimuli Pre-procedure Verification/Time Out Yes - 12:50 Taken: Start Time: 12:50 Pain Control: Lidocaine 4% T opical Solution T Area Debrided (L x W): otal 0.3 (cm) x 0.2 (cm) = 0.06 (cm) Tissue and other material debrided: Non-Viable, Callus Level: Non-Viable Tissue Debridement Description: Selective/Open Wound Instrument: Curette Bleeding: Minimum Hemostasis Achieved: Pressure End Time: 12:51 Procedural Pain: 0 Post Procedural Pain: 0 Response to Treatment: Procedure was tolerated  well Level of Consciousness (Post- Awake and Alert procedure): Post Debridement Measurements of Total Wound Length: (cm) 0.3 Width: (cm) 0.2 Depth: (cm) 0.2 Volume: (cm) 0.009 Character of Wound/Ulcer Post Debridement: Improved Severity of Tissue Post Debridement: Fat layer exposed Post Procedure Diagnosis Same as Pre-procedure Electronic Signature(s) Signed: 06/28/2022 3:45:45 PM By: [name] Guess MD FACS Signed: 06/28/2022 5:40:56 PM By: [name] Schwalbe RN Entered By: [name] on 06/28/2022 13:00:11 -------------------------------------------------------------------------------- HPI Details Patient Name: Date of Service: [name]. 06/28/2022 12:30 PM Medical Record Number: [id number] Patient Account Number: [id number] Date of Birth/Sex: Treating RN: 20-Nov-1931 (88 y.o. F) Primary Care Provider: SYSTEM, PRO V IDER Other Clinician: Referring Provider: Treating Provider/Extender: [name] in Treatment: 21 History of Present Illness HPI Description: ADMISSION 02/01/2022 This is an 88 year old woman who is a resident of [place]. She has a past medical history notable for type 2 diabetes mellitus, congestive heart failure, pulmonary hypertension, and complete heart block. She is accompanied today by her son. Approximately 4 months ago, she had an ingrown toe nail removed. Apparently this went a bit deep. Since that time, it has not healed. The patient is nonambulatory. Apparently they have been applying Santyl to the site at her facility. She was on antibiotics but I am not certain which specific drugs she took. She completed her last course a few weeks ago. We were unable to obtain ABIs in clinic secondary to the patient's body habitus. She has not had any formal vascular studies nor any x-ray of the area. 02/09/2022: The wound is roughly the same size today, but the amount of slough in the base has decreased significantly. She apparently had an x-ray  done at her facility, but we do not have the report; per verbal report from facility staff, "there was nothing there." We had requested formal ABI/TBI studies, but these were not done. She has been in [place] under [facility]	[place]. 02/16/2022: The wound measured about the same today, but to my eye, it appears rather smaller. The  slough in the base is minimal. Her vascular studies are scheduled for Friday. We have been using Santyl under Hydrofera Blue. No concern for infection. 02/23/2022: Once again, the wound has been measured about the same size, but I think it looks a bit smaller. There is slough at the base. She had her ABIs done last week and these show severe bilateral peripheral arterial disease. We received the report of the foot x-ray that was ordered. There is no concern for osteomyelitis at this time. 03/02/2022: The wound is smaller today with a small amount of slough at the base. We placed a referral to vein and vascular surgery last week in response to her severe peripheral arterial disease, but she does not yet have an appointment on the books. 03/16/2022: She saw Dr. Lenell [name] in the vascular surgery department yesterday. He did not think she was a good candidate for any revascularization options and suggested that she continue local wound care for another month. If she fails to heal in that time, they may consider an angiogram with potential intervention. The wound today is roughly the same size but there is less exposed bone at the base and the surface is clean without any substantial slough. 03/24/2022: The wound appears about the same. There is some periwound callus that has heaped up. We changed her to Prisma silver collagen last week. 04/06/2022: No significant change to the wound. It is neither improving nor deteriorating. She continues to accumulate some periwound callus around the margin. Bone is exposed at the base. 04/20/2022: The wound has improved quite a bit over the past 2 weeks. It  is smaller and there is no real accumulation of periwound callus. She had follow-up with Dr. Lenell [name] yesterday and he felt that the wound was progressing well enough that no arterial intervention was necessary. 05/18/2022: The patient has been absent due to a fall from her wheelchair during her last transportation that was supposed to be to her clinic. Today, the wound is about the same size. There is still bone exposed at the base. There is periwound callus accumulation. No concern for infection. 06/01/2022: The wound is actually a little bit smaller today and the surface is not as pale. She still has accumulated periwound callus. 06/15/2022: The wound continues to color tract, albeit slowly. No concern for infection. 06/28/2022: The wound is slightly smaller again today. She continues to accumulate callus and there is some periwound epibole at the margins. No concern for gross infection. Electronic Signature(s) Signed: 06/28/2022 1:10:53 PM By: [name] Guess MD FACS Entered By: [name] on 06/28/2022 13:10:53 -------------------------------------------------------------------------------- Physical Exam Details Patient Name: Date of Service: [name]. 06/28/2022 12:30 PM Medical Record Number: [id number] Patient Account Number: [id number] Date of Birth/Sex: Treating RN: 1931-07-06 (88 y.o. F) Primary Care Provider: SYSTEM, PRO V IDER Other Clinician: Referring Provider: Treating Provider/Extender: [name] in Treatment: 21 Constitutional . . . . No acute distress.[name] Respiratory Normal work of breathing on room air.. Notes 06/28/2022: The wound is slightly smaller again today. She continues to accumulate callus and there is some periwound epibole at the margins. No concern for gross infection. Electronic Signature(s) Signed: 06/28/2022 1:11:24 PM By: [name] Guess MD FACS Entered By: [name] on 06/28/2022  13:11:24 -------------------------------------------------------------------------------- Physician Orders Details Patient Name: Date of Service: [name], [name]. 06/28/2022 12:30 PM Medical Record Number: [id number] Patient Account Number: [id number] Date of Birth/Sex: Treating RN: [birth year] (88 y.o. F) [place], Randa Evens Primary Care Provider: SYSTEM, PRO V IDER Other  Clinician: Referring Provider: Treating Provider/Extender: [name] in Treatment: 21 Verbal / Phone Orders: No Diagnosis Coding ICD-10 Coding Code Description E11.621 Type 2 diabetes mellitus with foot ulcer L97.522 Non-pressure chronic ulcer of other part of left foot with fat layer exposed I50.32 Chronic diastolic (congestive) heart failure I27.20 Pulmonary hypertension, unspecified Follow-up Appointments ppointment in 2 weeks. - Dr. Lady Gary - Room 3 Wednesday August 23rd at 12:30pm Return A Bathing/ Shower/ Hygiene May shower with protection but do not get wound dressing(s) wet. Off-Loading Other: - No direct pressure on toes Wound Treatment Wound #1 - T Great oe Wound Laterality: Left Cleanser: Wound Cleanser Every Other Day/30 Days Discharge Instructions: Cleanse the wound with wound cleanser prior to applying a clean dressing using gauze sponges, not tissue or cotton balls. Topical: Gentamicin Every Other Day/30 Days Discharge Instructions: As directed by physician Prim Dressing: Promogran Prisma Matrix, 4.34 (sq in) (silver collagen) Every Other Day/30 Days ary Discharge Instructions: Moisten collagen with saline or hydrogel Prim Dressing: Optifoam Non-Adhesive Dressing, 4x4 in Every Other Day/30 Days ary Discharge Instructions: Apply to Left Great toe to protect toe Secondary Dressing: Woven Gauze Sponges 2x2 in Every Other Day/30 Days Discharge Instructions: Apply over primary dressing as directed. Secured With: Insurance underwriter, Sterile 2x75 (in/in) Every Other Day/30  Days Discharge Instructions: Secure with stretch gauze as directed. Secured With: Paper Tape, 1x10 (in/yd) Every Other Day/30 Days Discharge Instructions: Secure dressing with tape as directed. Electronic Signature(s) Signed: 06/28/2022 3:45:45 PM By: [name] Guess MD FACS Entered By: [name] on 06/28/2022 13:11:39 -------------------------------------------------------------------------------- Problem List Details Patient Name: Date of Service: [name], [name]. 06/28/2022 12:30 PM Medical Record Number: [id number] Patient Account Number: [id number] Date of Birth/Sex: Treating RN: 09/24/1931 (88 y.o. F) Primary Care Provider: SYSTEM, PRO V IDER Other Clinician: Referring Provider: Treating Provider/Extender: [name] in Treatment: 21 Active Problems ICD-10 Encounter Code Description Active Date MDM Diagnosis E11.621 Type 2 diabetes mellitus with foot ulcer 02/01/2022 No Yes L97.522 Non-pressure chronic ulcer of other part of left foot with fat layer exposed 02/01/2022 No Yes I50.32 Chronic diastolic (congestive) heart failure 02/01/2022 No Yes I27.20 Pulmonary hypertension, unspecified 02/01/2022 No Yes Inactive Problems Resolved Problems Electronic Signature(s) Signed: 06/28/2022 1:10:03 PM By: [name] Guess MD FACS Entered By: [name] on 06/28/2022 13:10:02 -------------------------------------------------------------------------------- Progress Note Details Patient Name: Date of Service: [name]. 06/28/2022 12:30 PM Medical Record Number: [id number] Patient Account Number: [id number] Date of Birth/Sex: Treating RN: [birth year] (88 y.o. F) Primary Care Provider: SYSTEM, PRO V IDER Other Clinician: Referring Provider: Treating Provider/Extender: [name] in Treatment: 21 Subjective Chief Complaint Information obtained from Patient Patient presents to the wound care center with open non-healing surgical  wound(s) History of Present Illness (HPI) ADMISSION 02/01/2022 This is an 88 year old woman who is a resident of [place]. She has a past medical history notable for type 2 diabetes mellitus, congestive heart failure, pulmonary hypertension, and complete heart block. She is accompanied today by her son. Approximately 4 months ago, she had an ingrown toe nail removed. Apparently this went a bit deep. Since that time, it has not healed. The patient is nonambulatory. Apparently they have been applying Santyl to the site at her facility. She was on antibiotics but I am not certain which specific drugs she took. She completed her last course a few weeks ago. We were unable to obtain ABIs in clinic secondary to the patient's body habitus. She has not had any formal vascular  studies nor any x-ray of the area. 02/09/2022: The wound is roughly the same size today, but the amount of slough in the base has decreased significantly. She apparently had an x-ray done at her facility, but we do not have the report; per verbal report from facility staff, "there was nothing there." We had requested formal ABI/TBI studies, but these were not done. She has been in [place] under [facility]	[place]. 02/16/2022: The wound measured about the same today, but to my eye, it appears rather smaller. The slough in the base is minimal. Her vascular studies are scheduled for Friday. We have been using Santyl under Hydrofera Blue. No concern for infection. 02/23/2022: Once again, the wound has been measured about the same size, but I think it looks a bit smaller. There is slough at the base. She had her ABIs done last week and these show severe bilateral peripheral arterial disease. We received the report of the foot x-ray that was ordered. There is no concern for osteomyelitis at this time. 03/02/2022: The wound is smaller today with a small amount of slough at the base. We placed a referral to vein and vascular surgery last week in  response to her severe peripheral arterial disease, but she does not yet have an appointment on the books. 03/16/2022: She saw Dr. Lenell [name] in the vascular surgery department yesterday. He did not think she was a good candidate for any revascularization options and suggested that she continue local wound care for another month. If she fails to heal in that time, they may consider an angiogram with potential intervention. The wound today is roughly the same size but there is less exposed bone at the base and the surface is clean without any substantial slough. 03/24/2022: The wound appears about the same. There is some periwound callus that has heaped up. We changed her to Prisma silver collagen last week. 04/06/2022: No significant change to the wound. It is neither improving nor deteriorating. She continues to accumulate some periwound callus around the margin. Bone is exposed at the base. 04/20/2022: The wound has improved quite a bit over the past 2 weeks. It is smaller and there is no real accumulation of periwound callus. She had follow-up with Dr. Lenell [name] yesterday and he felt that the wound was progressing well enough that no arterial intervention was necessary. 05/18/2022: The patient has been absent due to a fall from her wheelchair during her last transportation that was supposed to be to her clinic. Today, the wound is about the same size. There is still bone exposed at the base. There is periwound callus accumulation. No concern for infection. 06/01/2022: The wound is actually a little bit smaller today and the surface is not as pale. She still has accumulated periwound callus. 06/15/2022: The wound continues to color tract, albeit slowly. No concern for infection. 06/28/2022: The wound is slightly smaller again today. She continues to accumulate callus and there is some periwound epibole at the margins. No concern for gross infection. Patient History Social History Never smoker, Marital Status -  Widowed, Alcohol Use - Never, Drug Use - No History, Caffeine Use - Rarely. Medical History Cardiovascular Patient has history of Arrhythmia - AV block, complete heart block, Congestive Heart Failure, Hypertension Endocrine Patient has history of Type II Diabetes Musculoskeletal Patient has history of Gout, Osteoarthritis Medical A Surgical History Notes nd Cardiovascular Pulmonary Hypertension Endocrine Hypoglycemia Neurologic Acute encephalopathy Objective Constitutional No acute distress.. Vitals Time Taken: 12:42 PM, Temperature: 98.1 F, Pulse: 81 bpm,  Respiratory Rate: 16 breaths/min, Blood Pressure: 126/71 mmHg. Respiratory Normal work of breathing on room air.. General Notes: 06/28/2022: The wound is slightly smaller again today. She continues to accumulate callus and there is some periwound epibole at the margins. No concern for gross infection. Integumentary (Hair, Skin) Wound #1 status is Open. Original cause of wound was Gradually Appeared. The date acquired was: 09/29/2021. The wound has been in treatment 21 weeks. The wound is located on the Left T Great. The wound measures 0.3cm length x 0.2cm width x 0.2cm depth; 0.047cm^2 area and 0.009cm^3 volume. There is Fat oe Layer (Subcutaneous Tissue) exposed. There is no tunneling or undermining noted. There is a medium amount of serous drainage noted. The wound margin is flat and intact. There is small (1-33%) pink granulation within the wound bed. There is a large (67-100%) amount of necrotic tissue within the wound bed including Adherent Slough. Assessment Active Problems ICD-10 Type 2 diabetes mellitus with foot ulcer Non-pressure chronic ulcer of other part of left foot with fat layer exposed Chronic diastolic (congestive) heart failure Pulmonary hypertension, unspecified Procedures Wound #1 Pre-procedure diagnosis of Wound #1 is a Diabetic Wound/Ulcer of the Lower Extremity located on the Left T Great .Severity of  Tissue Pre Debridement is: oe Fat layer exposed. There was a Selective/Open Wound Non-Viable Tissue Debridement with a total area of 0.06 sq cm performed by [name] Guess, MD. With the following instrument(s): Curette to remove Non-Viable tissue/material. Material removed includes Callus after achieving pain control using Lidocaine 4% T opical Solution. No specimens were taken. A time out was conducted at 12:50, prior to the start of the procedure. A Minimum amount of bleeding was controlled with Pressure. The procedure was tolerated well with a pain level of 0 throughout and a pain level of 0 following the procedure. Post Debridement Measurements: 0.3cm length x 0.2cm width x 0.2cm depth; 0.009cm^3 volume. Character of Wound/Ulcer Post Debridement is improved. Severity of Tissue Post Debridement is: Fat layer exposed. Post procedure Diagnosis Wound #1: Same as Pre-Procedure Plan Follow-up Appointments: Return Appointment in 2 weeks. - Dr. Lady Gary - Room 3 Wednesday August 23rd at 12:30pm Bathing/ Shower/ Hygiene: May shower with protection but do not get wound dressing(s) wet. Off-Loading: Other: - No direct pressure on toes WOUND #1: - T Great Wound Laterality: Left oe Cleanser: Wound Cleanser Every Other Day/30 Days Discharge Instructions: Cleanse the wound with wound cleanser prior to applying a clean dressing using gauze sponges, not tissue or cotton balls. Topical: Gentamicin Every Other Day/30 Days Discharge Instructions: As directed by physician Prim Dressing: Promogran Prisma Matrix, 4.34 (sq in) (silver collagen) Every Other Day/30 Days ary Discharge Instructions: Moisten collagen with saline or hydrogel Prim Dressing: Optifoam Non-Adhesive Dressing, 4x4 in Every Other Day/30 Days ary Discharge Instructions: Apply to Left Great toe to protect toe Secondary Dressing: Woven Gauze Sponges 2x2 in Every Other Day/30 Days Discharge Instructions: Apply over primary dressing as  directed. Secured With: Insurance underwriter, Sterile 2x75 (in/in) Every Other Day/30 Days Discharge Instructions: Secure with stretch gauze as directed. Secured With: Paper Tape, 1x10 (in/yd) Every Other Day/30 Days Discharge Instructions: Secure dressing with tape as directed. 06/28/2022: The wound is slightly smaller again today. She continues to accumulate callus and there is some periwound epibole at the margins. No concern for gross infection. I used a curette to debride the periwound callus and pare back the heaped up senescent skin at the wound margins. Although there is no gross evidence of infection,  she has a propensity to develop them given the location of the wound and so I am going to continue the topical gentamicin. I am going to change the primary dressing to Prisma silver collagen to try and stimulate some ingrowth of epithelium. In the meantime, we will also run her insurance to see if she would be covered for product such as Grafix or Oasis. Follow-up in 2 weeks. Electronic Signature(s) Signed: 06/28/2022 1:12:43 PM By: [name] Guess MD FACS Entered By: [name] on 06/28/2022 13:12:43 -------------------------------------------------------------------------------- HxROS Details Patient Name: Date of Service: [name], [name]. 06/28/2022 12:30 PM Medical Record Number: [id number] Patient Account Number: [id number] Date of Birth/Sex: Treating RN: 1931-01-03 (88 y.o. F) Primary Care Provider: SYSTEM, PRO V IDER Other Clinician: Referring Provider: Treating Provider/Extender: [name] in Treatment: 21 Cardiovascular Medical History: Positive for: Arrhythmia - AV block, complete heart block; Congestive Heart Failure; Hypertension Past Medical History Notes: Pulmonary Hypertension Endocrine Medical History: Positive for: Type II Diabetes Past Medical History Notes: Hypoglycemia Treated with: Oral agents Blood sugar tested every day:  Yes Tested : 2-3x a day Musculoskeletal Medical History: Positive for: Gout; Osteoarthritis Neurologic Medical History: Past Medical History Notes: Acute encephalopathy Immunizations Pneumococcal Vaccine: Received Pneumococcal Vaccination: No Implantable Devices No devices added Family and Social History Never smoker; Marital Status - Widowed; Alcohol Use: Never; Drug Use: No History; Caffeine Use: Rarely; Financial Concerns: No; Food, Clothing or Shelter Needs: No; Support System Lacking: No; Transportation Concerns: No Psychologist, prison and probation services) Signed: 06/28/2022 3:45:45 PM By: [name] Guess MD FACS Entered By: [name] on 06/28/2022 13:11:03 -------------------------------------------------------------------------------- SuperBill Details Patient Name: Date of Service: [name] 06/28/2022 Medical Record Number: [id number] Patient Account Number: [id number] Date of Birth/Sex: Treating RN: Jun 28, 1931 (88 y.o. F) Primary Care Provider: SYSTEM, PRO V IDER Other Clinician: Referring Provider: Treating Provider/Extender: [name] in Treatment: 21 Diagnosis Coding ICD-10 Codes Code Description E11.621 Type 2 diabetes mellitus with foot ulcer L97.522 Non-pressure chronic ulcer of other part of left foot with fat layer exposed I50.32 Chronic diastolic (congestive) heart failure I27.20 Pulmonary hypertension, unspecified Facility Procedures CPT4 Code: [id number] Description: [phone] - DEBRIDE WOUND 1ST 20 SQ CM OR < ICD-10 Diagnosis Description L97.522 Non-pressure chronic ulcer of other part of left foot with fat layer exposed Modifier: Quantity: 1 Physician Procedures : CPT4 Code Description Modifier [id number] - WC PHYS LEVEL 4 - EST PT 25 ICD-10 Diagnosis Description L97.522 Non-pressure chronic ulcer of other part of left foot with fat layer exposed E11.621 Type 2 diabetes mellitus with foot ulcer I50.32 Chronic  diastolic (congestive) heart  failure I27.20 Pulmonary hypertension, unspecified Quantity: 1 : [id number] [id number] - WC PHYS DEBR WO ANESTH 20 SQ CM ICD-10 Diagnosis Description L97.522 Non-pressure chronic ulcer of other part of left foot with fat layer exposed Quantity: 1 Electronic Signature(s) Signed: 06/28/2022 1:13:02 PM By: [name] Guess MD FACS Entered By: [name] on 06/28/2022 13:13:01

## 2022-06-29 ENCOUNTER — Encounter (HOSPITAL_BASED_OUTPATIENT_CLINIC_OR_DEPARTMENT_OTHER): Payer: Medicare Other | Admitting: General Surgery

## 2022-07-11 ENCOUNTER — Ambulatory Visit (INDEPENDENT_AMBULATORY_CARE_PROVIDER_SITE_OTHER): Payer: Medicare Other

## 2022-07-11 ENCOUNTER — Encounter (HOSPITAL_BASED_OUTPATIENT_CLINIC_OR_DEPARTMENT_OTHER): Payer: Medicare Other | Admitting: General Surgery

## 2022-07-11 DIAGNOSIS — I442 Atrioventricular block, complete: Secondary | ICD-10-CM

## 2022-07-11 DIAGNOSIS — E11621 Type 2 diabetes mellitus with foot ulcer: Secondary | ICD-10-CM | POA: Diagnosis not present

## 2022-07-12 LAB — CUP PACEART REMOTE DEVICE CHECK
Battery Remaining Longevity: 139 mo
Battery Voltage: 3.14 V
Brady Statistic AP VP Percent: 19.4 %
Brady Statistic AP VS Percent: 0 %
Brady Statistic AS VP Percent: 80.59 %
Brady Statistic AS VS Percent: 0.01 %
Brady Statistic RA Percent Paced: 19.38 %
Brady Statistic RV Percent Paced: 99.99 %
Date Time Interrogation Session: 20230821013558
Implantable Lead Implant Date: 20130118
Implantable Lead Implant Date: 20130118
Implantable Lead Location: 753859
Implantable Lead Location: 753860
Implantable Lead Model: 5076
Implantable Lead Model: 5076
Implantable Pulse Generator Implant Date: 20230213
Lead Channel Impedance Value: 323 Ohm
Lead Channel Impedance Value: 361 Ohm
Lead Channel Impedance Value: 418 Ohm
Lead Channel Impedance Value: 418 Ohm
Lead Channel Pacing Threshold Amplitude: 0.875 V
Lead Channel Pacing Threshold Amplitude: 1 V
Lead Channel Pacing Threshold Pulse Width: 0.4 ms
Lead Channel Pacing Threshold Pulse Width: 0.4 ms
Lead Channel Sensing Intrinsic Amplitude: 0.875 mV
Lead Channel Sensing Intrinsic Amplitude: 0.875 mV
Lead Channel Setting Pacing Amplitude: 2 V
Lead Channel Setting Pacing Amplitude: 2 V
Lead Channel Setting Pacing Pulse Width: 0.4 ms
Lead Channel Setting Sensing Sensitivity: 2.8 mV

## 2022-07-12 NOTE — Progress Notes (Signed)
[name], SCHWAKE ([id number]) Visit Report for 07/11/2022 Arrival Information Details Patient Name: Date of Service: [name], HODGKISS 07/11/2022 12:30 PM Medical Record Number: [id number] Patient Account Number: [id number] Date of Birth/Sex: Treating RN: 1930/12/24 (88 y.o. [name], [name] Primary Care Daivon Rayos: SYSTEM, PRO V IDER Other Clinician: Referring Vanice Rappa: Treating Dakoda Bassette/Extender: Jadene Pierini in Treatment: 22 Visit Information History Since Last Visit Added or deleted any medications: No Patient Arrived: Wheel Chair Any new allergies or adverse reactions: No Arrival Time: 12:49 Had a fall or experienced change in No Accompanied By: daughter activities of daily living that may affect Transfer Assistance: None risk of falls: Patient Identification Verified: Yes Signs or symptoms of abuse/neglect since last visito No Patient Requires Transmission-Based Precautions: No Hospitalized since last visit: No Patient Has Alerts: Yes Implantable device outside of the clinic excluding No Patient Alerts: ABI not obtainable cellular tissue based products placed in the center since last visit: Has Dressing in Place as Prescribed: Yes Pain Present Now: Yes Electronic Signature(s) Signed: 07/12/2022 5:35:47 PM By: Dellie Catholic RN Entered By: Dellie Catholic on 07/11/2022 12:50:20 -------------------------------------------------------------------------------- Encounter Discharge Information Details Patient Name: Date of Service: [name], [name]. 07/11/2022 12:30 PM Medical Record Number: [id number] Patient Account Number: [id number] Date of Birth/Sex: Treating RN: Dec 31, 1930 (88 y.o. [name] Primary Care Donis Pinder: SYSTEM, PRO Ave Filter Other Clinician: Referring Edmond Ginsberg: Treating Ladoris Lythgoe/Extender: Jadene Pierini in Treatment: 22 Encounter Discharge Information Items Post Procedure Vitals Discharge Condition: Stable Temperature (F):  98.7 Ambulatory Status: Wheelchair Pulse (bpm): 85 Discharge Destination: Home Respiratory Rate (breaths/min): 16 Transportation: Private Auto Blood Pressure (mmHg): 116/55 Accompanied By: daughter Schedule Follow-up Appointment: No Clinical Summary of Care: Electronic Signature(s) Signed: 07/12/2022 5:35:47 PM By: Dellie Catholic RN Entered By: Dellie Catholic on 07/11/2022 17:34:21 -------------------------------------------------------------------------------- Lower Extremity Assessment Details Patient Name: Date of Service: [name], [name] 07/11/2022 12:30 PM Medical Record Number: [id number] Patient Account Number: [id number] Date of Birth/Sex: Treating RN: Nov 07, 1931 (88 y.o. [name] Cremeans Primary Care Coltrane Tugwell: SYSTEM, PRO Ave Filter Other Clinician: Referring [name]: Treating Leshon Armistead/Extender: Jadene Pierini in Treatment: 22 Edema Assessment Assessed: [Left: No] [Right: No] Edema: [Left: Ye] [Right: s] Calf Left: Right: Point of Measurement: 26 cm From Medial Instep 39.5 cm Ankle Left: Right: Point of Measurement: 10 cm From Medial Instep 25.2 cm Electronic Signature(s) Signed: 07/12/2022 5:35:47 PM By: Dellie Catholic RN Entered By: Dellie Catholic on 07/11/2022 12:51:35 -------------------------------------------------------------------------------- Multi Wound Chart Details Patient Name: Date of Service: [name]. 07/11/2022 12:30 PM Medical Record Number: [id number] Patient Account Number: [id number] Date of Birth/Sex: Treating RN: 1930-12-21 (88 y.o. F) Primary Care [name]: SYSTEM, PRO V IDER Other Clinician: Referring [name]: Treating [name]/Extender: Jadene Pierini in Treatment: 22 Vital Signs Height(in): Pulse(bpm): 66 Weight(lbs): Blood Pressure(mmHg): 116/55 Body Mass Index(BMI): Temperature(F): 98.7 Respiratory Rate(breaths/min): 16 Photos: [N/A:N/A] Left T Great oe N/A N/A Wound Location: Gradually  Appeared N/A N/A Wounding Event: Diabetic Wound/Ulcer of the Lower N/A N/A Primary Etiology: Extremity Arrhythmia, Congestive Heart Failure, N/A N/A Comorbid History: Hypertension, Type II Diabetes, Gout, Osteoarthritis 09/29/2021 N/A N/A Date Acquired: 22 N/A N/A Weeks of Treatment: Open N/A N/A Wound Status: No N/A N/A Wound Recurrence: 0.3x0.2x0.2 N/A N/A Measurements L x W x D (cm) 0.047 N/A N/A A (cm) : rea 0.009 N/A N/A Volume (cm) : 83.40% N/A N/A % Reduction in A rea: 84.20% N/A N/A % Reduction in Volume: Grade 2 N/A N/A Classification: Medium N/A N/A Exudate A mount: Serous N/A  N/A Exudate Type: amber N/A N/A Exudate Color: Flat and Intact N/A N/A Wound Margin: Medium (34-66%) N/A N/A Granulation A mount: Pink N/A N/A Granulation Quality: Medium (34-66%) N/A N/A Necrotic A mount: Fascia: No N/A N/A Exposed Structures: Fat Layer (Subcutaneous Tissue): No Tendon: No Muscle: No Joint: No Bone: No Small (1-33%) N/A N/A Epithelialization: Debridement - Selective/Open Wound N/A N/A Debridement: Pre-procedure Verification/Time Out 13:03 N/A N/A Taken: Lidocaine 5% topical ointment N/A N/A Pain Control: Necrotic/Eschar N/A N/A Tissue Debrided: Non-Viable Tissue N/A N/A Level: 0.06 N/A N/A Debridement A (sq cm): rea Curette N/A N/A Instrument: Minimum N/A N/A Bleeding: Pressure N/A N/A Hemostasis A chieved: 0 N/A N/A Procedural Pain: 0 N/A N/A Post Procedural Pain: Procedure was tolerated well N/A N/A Debridement Treatment Response: 0.3x0.2x0.2 N/A N/A Post Debridement Measurements L x W x D (cm) 0.009 N/A N/A Post Debridement Volume: (cm) Debridement N/A N/A Procedures Performed: Treatment Notes Electronic Signature(s) Signed: 07/11/2022 1:18:53 PM By: Fredirick Maudlin MD FACS Entered By: Fredirick Maudlin on 07/11/2022 13:18:53 -------------------------------------------------------------------------------- Multi-Disciplinary  Care Plan Details Patient Name: Date of Service: [name]. 07/11/2022 12:30 PM Medical Record Number: [id number] Patient Account Number: [id number] Date of Birth/Sex: Treating RN: 1931-11-07 (88 y.o. [name] Pursel Primary Care Amaurie Schreckengost: SYSTEM, PRO Ave Filter Other Clinician: Referring Kyion Gautier: Treating Janmichael Giraud/Extender: Jadene Pierini in Treatment: 22 Multidisciplinary Care Plan reviewed with physician Active Inactive Nutrition Nursing Diagnoses: Impaired glucose control: actual or potential Potential for alteratiion in Nutrition/Potential for imbalanced nutrition Goals: Patient/caregiver will maintain therapeutic glucose control Date Initiated: 02/01/2022 Target Resolution Date: 08/20/2022 Goal Status: Active Interventions: Assess HgA1c results as ordered upon admission and as needed Assess patient nutrition upon admission and as needed per policy Provide education on elevated blood sugars and impact on wound healing Provide education on nutrition Treatment Activities: Education provided on Nutrition : 02/01/2022 Notes: Wound/Skin Impairment Nursing Diagnoses: Impaired tissue integrity Knowledge deficit related to ulceration/compromised skin integrity Goals: Patient/caregiver will verbalize understanding of skin care regimen Date Initiated: 02/01/2022 Date Inactivated: 05/18/2022 Target Resolution Date: 04/28/2022 Goal Status: Met Ulcer/skin breakdown will have a volume reduction of 30% by week 4 Date Initiated: 02/01/2022 Date Inactivated: 03/24/2022 Target Resolution Date: 03/31/2022 Goal Status: Unmet Unmet Reason: PAD Ulcer/skin breakdown will have a volume reduction of 50% by week 8 Date Initiated: 04/06/2022 Target Resolution Date: 08/20/2022 Goal Status: Active Interventions: Assess patient/caregiver ability to obtain necessary supplies Assess patient/caregiver ability to perform ulcer/skin care regimen upon admission and as needed Assess  ulceration(s) every visit Provide education on ulcer and skin care Notes: Electronic Signature(s) Signed: 07/12/2022 5:35:47 PM By: Dellie Catholic RN Entered By: Dellie Catholic on 07/11/2022 17:31:21 -------------------------------------------------------------------------------- Pain Assessment Details Patient Name: Date of Service: [name] 07/11/2022 12:30 PM Medical Record Number: [id number] Patient Account Number: [id number] Date of Birth/Sex: Treating RN: [birth year] (88 y.o. [name] Heier Primary Care Matej Sappenfield: SYSTEM, PRO Ave Filter Other Clinician: Referring Kaivon Livesey: Treating Itzayanna Kaster/Extender: Jadene Pierini in Treatment: 22 Active Problems Location of Pain Severity and Description of Pain Patient Has Paino Yes Site Locations Pain Location: Pain Location: Generalized Pain With Dressing Change: Yes Duration of the Pain. Constant / Intermittento Constant Rate the pain. Current Pain Level: 4 Worst Pain Level: 10 Least Pain Level: 4 Tolerable Pain Level: 4 Character of Pain Describe the Pain: Difficult to Pinpoint Pain Management and Medication Current Pain Management: Medication: No Cold Application: No Rest: Yes Massage: No Activity: No T.E.N.S.: No Heat Application: No Leg drop or elevation: No  Is the Current Pain Management Adequate: Adequate How does your wound impact your activities of daily livingo Sleep: No Bathing: No Appetite: No Relationship With Others: No Bladder Continence: No Emotions: No Bowel Continence: No Work: No Toileting: No Drive: No Dressing: No Hobbies: No Electronic Signature(s) Signed: 07/12/2022 5:35:47 PM By: Dellie Catholic RN Entered By: Dellie Catholic on 07/11/2022 12:51:31 -------------------------------------------------------------------------------- Patient/Caregiver Education Details Patient Name: Date of Service: [name] 8/21/2023andnbsp12:30 PM Medical Record Number:  [id number] Patient Account Number: [id number] Date of Birth/Gender: Treating RN: [birth year] (88 y.o. [name] Carles Primary Care Physician: SYSTEM, PRO V IDER Other Clinician: Referring Physician: Treating Physician/Extender: Jadene Pierini in Treatment: 22 Education Assessment Education Provided To: Patient Education Topics Provided Wound/Skin Impairment: Methods: Explain/Verbal Responses: Return demonstration correctly Electronic Signature(s) Signed: 07/12/2022 5:35:47 PM By: Dellie Catholic RN Entered By: Dellie Catholic on 07/11/2022 17:31:36 -------------------------------------------------------------------------------- Wound Assessment Details Patient Name: Date of Service: [name] 07/11/2022 12:30 PM Medical Record Number: [id number] Patient Account Number: [id number] Date of Birth/Sex: Treating RN: 04-21-1931 (88 y.o. [name] Murtaugh Primary Care Cameron Schwinn: SYSTEM, PRO V IDER Other Clinician: Referring Darren Caldron: Treating Eion Timbrook/Extender: Jadene Pierini in Treatment: 22 Wound Status Wound Number: 1 Primary Diabetic Wound/Ulcer of the Lower Extremity Etiology: Wound Location: Left T Great oe Wound Open Wounding Event: Gradually Appeared Status: Date Acquired: 09/29/2021 Comorbid Arrhythmia, Congestive Heart Failure, Hypertension, Type II Weeks Of Treatment: 22 History: Diabetes, Gout, Osteoarthritis Clustered Wound: No Photos Wound Measurements Length: (cm) 0.3 Width: (cm) 0.2 Depth: (cm) 0.2 Area: (cm) 0.047 Volume: (cm) 0.009 % Reduction in Area: 83.4% % Reduction in Volume: 84.2% Epithelialization: Small (1-33%) Tunneling: No Undermining: No Wound Description Classification: Grade 2 Wound Margin: Flat and Intact Exudate Amount: Medium Exudate Type: Serous Exudate Color: amber Foul Odor After Cleansing: No Slough/Fibrino Yes Wound Bed Granulation Amount: Medium (34-66%) Exposed Structure Granulation Quality:  Pink Fascia Exposed: No Necrotic Amount: Medium (34-66%) Fat Layer (Subcutaneous Tissue) Exposed: No Necrotic Quality: Adherent Slough Tendon Exposed: No Muscle Exposed: No Joint Exposed: No Bone Exposed: No Treatment Notes Wound #1 (Toe Great) Wound Laterality: Left Cleanser Wound Cleanser Discharge Instruction: Cleanse the wound with wound cleanser prior to applying a clean dressing using gauze sponges, not tissue or cotton balls. Peri-Wound Care Topical Primary Dressing Optifoam Non-Adhesive Dressing, 4x4 in Discharge Instruction: Apply to Left Great toe to protect toe GRAFIX Discharge Instruction: Skin Sub Secondary Dressing Woven Gauze Sponges 2x2 in Discharge Instruction: Apply over primary dressing as directed. Secured With Conforming Stretch Gauze Bandage, Sterile 2x75 (in/in) Discharge Instruction: Secure with stretch gauze as directed. Paper Tape, 1x10 (in/yd) Discharge Instruction: Secure dressing with tape as directed. Compression Wrap Compression Stockings Add-Ons Electronic Signature(s) Signed: 07/12/2022 5:35:47 PM By: Dellie Catholic RN Entered By: Dellie Catholic on 07/11/2022 12:54:17 -------------------------------------------------------------------------------- Vitals Details Patient Name: Date of Service: [name], [name]. 07/11/2022 12:30 PM Medical Record Number: [id number] Patient Account Number: [id number] Date of Birth/Sex: Treating RN: October 28, 1931 (88 y.o. [name] Erkkila Primary Care Jamarrion Budai: SYSTEM, PRO V IDER Other Clinician: Referring Conner Muegge: Treating Temple Sporer/Extender: Jadene Pierini in Treatment: 22 Vital Signs Time Taken: 12:49 Temperature (F): 98.7 Pulse (bpm): 85 Respiratory Rate (breaths/min): 16 Blood Pressure (mmHg): 116/55 Reference Range: 80 - 120 mg / dl Electronic Signature(s) Signed: 07/12/2022 5:35:47 PM By: Dellie Catholic RN Entered By: Dellie Catholic on 07/11/2022 12:50:59

## 2022-07-12 NOTE — Progress Notes (Addendum)
ZASHA, BELLEAU ([id number]) Visit Report for 07/11/2022 Chief Complaint Document Details Patient Name: Date of Service: [name], [name] 07/11/2022 12:30 PM Medical Record Number: [id number] Patient Account Number: [id number] Date of Birth/Sex: Treating RN: Sep 06, 1931 (88 y.o. F) Primary Care Provider: SYSTEM, PRO V IDER Other Clinician: Referring Provider: Treating Provider/Extender: [name] in Treatment: 22 Information Obtained from: Patient Chief Complaint Patient presents to the wound care center with open non-healing surgical wound(s) Electronic Signature(s) Signed: 07/11/2022 1:19:07 PM By: Duanne Guess MD FACS Entered By: Duanne Guess on 07/11/2022 13:19:06 -------------------------------------------------------------------------------- Cellular or Tissue Based Product Details Patient Name: Date of Service: [name]. 07/11/2022 12:30 PM Medical Record Number: [id number] Patient Account Number: [id number] Date of Birth/Sex: Treating RN: 1931/05/04 (88 y.o. [name] Primary Care Provider: SYSTEM, PRO Rodena Goldmann Other Clinician: Referring Provider: Treating Provider/Extender: [name] in Treatment: 22 Cellular or Tissue Based Product Type Wound #1 Left T Great oe Applied to: Performed By: Physician Duanne Guess, MD Cellular or Tissue Based Product Type: Grafix prime Level of Consciousness (Pre-procedure): Awake and Alert Pre-procedure Verification/Time Out Yes - 13:03 Taken: Location: genitalia / hands / feet / multiple digits Wound Size (sq cm): 0.06 Product Size (sq cm): 2 Waste Size (sq cm): 0 Amount of Product Applied (sq cm): 2 Instrument Used: Forceps, Scissors Lot #: [id number] Order #: 1 Expiration Date: 10/08/2023 Fenestrated: No Reconstituted: Yes Solution Type: Normal Saline Solution Amount: 3 Lot #: [id number] Solution Expiration Date: 08/21/2022 Secured: Yes Secured With: Steri-Strips Dressing  Applied: Yes Primary Dressing: Adaptic;gauze Procedural Pain: 0 Post Procedural Pain: 0 Response to Treatment: Procedure was tolerated well Level of Consciousness (Post- Awake and Alert procedure): Post Procedure Diagnosis Same as Pre-procedure Notes GRAFIX PL PRIME 16mm Disc. Electronic Signature(s) Signed: 07/12/2022 10:17:20 AM By: Duanne Guess MD FACS Signed: 07/12/2022 5:35:47 PM By: Karie Schwalbe RN Entered By: Karie Schwalbe on 07/11/2022 17:38:23 -------------------------------------------------------------------------------- Debridement Details Patient Name: Date of Service: [name]. 07/11/2022 12:30 PM Medical Record Number: [id number] Patient Account Number: [id number] Date of Birth/Sex: Treating RN: 01-05-1931 (88 y.o. [name] Primary Care Provider: SYSTEM, PRO V IDER Other Clinician: Referring Provider: Treating Provider/Extender: [name] in Treatment: 22 Debridement Performed for Assessment: Wound #1 Left T Great oe Performed By: Physician Duanne Guess, MD Debridement Type: Debridement Severity of Tissue Pre Debridement: Fat layer exposed Level of Consciousness (Pre-procedure): Awake and Alert Pre-procedure Verification/Time Out Yes - 13:03 Taken: Start Time: 13:03 Pain Control: Lidocaine 5% topical ointment T Area Debrided (L x W): otal 0.3 (cm) x 0.2 (cm) = 0.06 (cm) Tissue and other material debrided: Non-Viable, Eschar Level: Non-Viable Tissue Debridement Description: Selective/Open Wound Instrument: Curette Bleeding: Minimum Hemostasis Achieved: Pressure End Time: 13:05 Procedural Pain: 0 Post Procedural Pain: 0 Response to Treatment: Procedure was tolerated well Level of Consciousness (Post- Awake and Alert procedure): Post Debridement Measurements of Total Wound Length: (cm) 0.3 Width: (cm) 0.2 Depth: (cm) 0.2 Volume: (cm) 0.009 Character of Wound/Ulcer Post Debridement: Improved Severity of  Tissue Post Debridement: Fat layer exposed Post Procedure Diagnosis Same as Pre-procedure Electronic Signature(s) Signed: 07/11/2022 2:17:33 PM By: Duanne Guess MD FACS Signed: 07/12/2022 5:35:47 PM By: Karie Schwalbe RN Previous Signature: 07/11/2022 1:22:44 PM Version By: Duanne Guess MD FACS Entered By: Karie Schwalbe on 07/11/2022 13:37:19 -------------------------------------------------------------------------------- HPI Details Patient Name: Date of Service: [name]. 07/11/2022 12:30 PM Medical Record Number: [id number] Patient Account Number: [id number] Date of Birth/Sex: Treating RN: Sep 23, 1931 (88 y.o.  F) Primary Care Provider: SYSTEM, PRO V IDER Other Clinician: Referring Provider: Treating Provider/Extender: [name] in Treatment: 22 History of Present Illness HPI Description: ADMISSION 02/01/2022 This is an 88 year old woman who is a resident of [facility]. She has a past medical history notable for type 2 diabetes mellitus, congestive heart failure, pulmonary hypertension, and complete heart block. She is accompanied today by her son. Approximately 4 months ago, she had an ingrown toe nail removed. Apparently this went a bit deep. Since that time, it has not healed. The patient is nonambulatory. Apparently they have been applying Santyl to the site at her facility. She was on antibiotics but I am not certain which specific drugs she took. She completed her last course a few weeks ago. We were unable to obtain ABIs in clinic secondary to the patient's body habitus. She has not had any formal vascular studies nor any x-ray of the area. 02/09/2022: The wound is roughly the same size today, but the amount of slough in the base has decreased significantly. She apparently had an x-ray done at her facility, but we do not have the report; per verbal report from facility staff, "there was nothing there." We had requested formal ABI/TBI studies, but  these were not done. She has been in [place] under [facility]	[place]. 02/16/2022: The wound measured about the same today, but to my eye, it appears rather smaller. The slough in the base is minimal. Her vascular studies are scheduled for Friday. We have been using Santyl under Hydrofera Blue. No concern for infection. 02/23/2022: Once again, the wound has been measured about the same size, but I think it looks a bit smaller. There is slough at the base. She had her ABIs done last week and these show severe bilateral peripheral arterial disease. We received the report of the foot x-ray that was ordered. There is no concern for osteomyelitis at this time. 03/02/2022: The wound is smaller today with a small amount of slough at the base. We placed a referral to vein and vascular surgery last week in response to her severe peripheral arterial disease, but she does not yet have an appointment on the books. 03/16/2022: She saw Dr. Lenell Antu in the vascular surgery department yesterday. He did not think she was a good candidate for any revascularization options and suggested that she continue local wound care for another month. If she fails to heal in that time, they may consider an angiogram with potential intervention. The wound today is roughly the same size but there is less exposed bone at the base and the surface is clean without any substantial slough. 03/24/2022: The wound appears about the same. There is some periwound callus that has heaped up. We changed her to Prisma silver collagen last week. 04/06/2022: No significant change to the wound. It is neither improving nor deteriorating. She continues to accumulate some periwound callus around the margin. Bone is exposed at the base. 04/20/2022: The wound has improved quite a bit over the past 2 weeks. It is smaller and there is no real accumulation of periwound callus. She had follow-up with Dr. Lenell Antu yesterday and he felt that the wound was progressing well  enough that no arterial intervention was necessary. 05/18/2022: The patient has been absent due to a fall from her wheelchair during her last transportation that was supposed to be to her clinic. Today, the wound is about the same size. There is still bone exposed at the base. There is periwound callus accumulation. No  concern for infection. 06/01/2022: The wound is actually a little bit smaller today and the surface is not as pale. She still has accumulated periwound callus. 06/15/2022: The wound continues to contract, albeit slowly. No concern for infection. 06/28/2022: The wound is slightly smaller again today. She continues to accumulate callus and there is some periwound epibole at the margins. No concern for gross infection. 07/11/2022: The wound is roughly the same size with just a small opening in the center. She has been approved for Grafix. Electronic Signature(s) Signed: 07/11/2022 1:19:51 PM By: Duanne Guess MD FACS Entered By: Duanne Guess on 07/11/2022 13:19:51 -------------------------------------------------------------------------------- Physical Exam Details Patient Name: Date of Service: [name]. 07/11/2022 12:30 PM Medical Record Number: [id number] Patient Account Number: [id number] Date of Birth/Sex: Treating RN: 01-25-1931 (88 y.o. F) Primary Care Provider: SYSTEM, PRO V IDER Other Clinician: Referring Provider: Treating Provider/Extender: [name] in Treatment: 22 Constitutional . . . . No acute distress.[name] Respiratory Normal work of breathing on room air.. Notes 07/11/2022: The wound is roughly the same size with just a small opening in the center. Electronic Signature(s) Signed: 07/11/2022 1:20:21 PM By: Duanne Guess MD FACS Entered By: Duanne Guess on 07/11/2022 13:20:20 -------------------------------------------------------------------------------- Physician Orders Details Patient Name: Date of Service: [name].  07/11/2022 12:30 PM Medical Record Number: [id number] Patient Account Number: [id number] Date of Birth/Sex: Treating RN: 1931-08-15 (88 y.o. [name] Primary Care Provider: SYSTEM, PRO V IDER Other Clinician: Referring Provider: Treating Provider/Extender: [name] in Treatment: 22 Verbal / Phone Orders: No Diagnosis Coding ICD-10 Coding Code Description E11.621 Type 2 diabetes mellitus with foot ulcer L97.522 Non-pressure chronic ulcer of other part of left foot with fat layer exposed I50.32 Chronic diastolic (congestive) heart failure I27.20 Pulmonary hypertension, unspecified Follow-up Appointments ppointment in 1 week. - Dr Lady Gary Room 3 Monday 07/18/22 at 12:30pm Return A Other: - Grafix #1 (07/11/22) Cellular or Tissue Based Products Wound #1 Left T Great oe daptic or Mepitel. (DO NOT REMOVE). - *** Cellular or Tissue Based Product applied to wound bed, secured with steri-strips, cover with A Do not remove dressing for a week**** Grafix (Disc) 07/11/22 #1 Bathing/ Shower/ Hygiene May shower with protection but do not get wound dressing(s) wet. - Do Not Left Great T wet**** Skin Substitute (Grafix) applied 07/11/22**** oe Off-Loading Other: - No direct pressure on toes Wound Treatment Wound #1 - T Great oe Wound Laterality: Left Cleanser: Wound Cleanser 1 x Per Week/30 Days Discharge Instructions: Cleanse the wound with wound cleanser prior to applying a clean dressing using gauze sponges, not tissue or cotton balls. Prim Dressing: Optifoam Non-Adhesive Dressing, 4x4 in 1 x Per Week/30 Days ary Discharge Instructions: Apply to Left Great toe to protect toe Prim Dressing: GRAFIX 1 x Per Week/30 Days ary Discharge Instructions: Skin Sub Secondary Dressing: Woven Gauze Sponges 2x2 in 1 x Per Week/30 Days Discharge Instructions: Apply over primary dressing as directed. Secured With: Insurance underwriter, Sterile 2x75 (in/in) 1 x Per Week/30  Days Discharge Instructions: Secure with stretch gauze as directed. Secured With: Paper Tape, 1x10 (in/yd) 1 x Per Week/30 Days Discharge Instructions: Secure dressing with tape as directed. Electronic Signature(s) Signed: 07/11/2022 1:22:44 PM By: Duanne Guess MD FACS Entered By: Duanne Guess on 07/11/2022 13:20:36 -------------------------------------------------------------------------------- Problem List Details Patient Name: Date of Service: [name]. 07/11/2022 12:30 PM Medical Record Number: [id number] Patient Account Number: [id number] Date of Birth/Sex: Treating RN: 1931/08/20 (88 y.o. F) Primary Care  Provider: SYSTEM, PRO V IDER Other Clinician: Referring Provider: Treating Provider/Extender: [name] in Treatment: 22 Active Problems ICD-10 Encounter Code Description Active Date MDM Diagnosis E11.621 Type 2 diabetes mellitus with foot ulcer 02/01/2022 No Yes L97.522 Non-pressure chronic ulcer of other part of left foot with fat layer exposed 02/01/2022 No Yes I50.32 Chronic diastolic (congestive) heart failure 02/01/2022 No Yes I27.20 Pulmonary hypertension, unspecified 02/01/2022 No Yes Inactive Problems Resolved Problems Electronic Signature(s) Signed: 07/11/2022 1:18:46 PM By: Duanne Guess MD FACS Entered By: Duanne Guess on 07/11/2022 13:18:46 -------------------------------------------------------------------------------- Progress Note Details Patient Name: Date of Service: [name]. 07/11/2022 12:30 PM Medical Record Number: [id number] Patient Account Number: [id number] Date of Birth/Sex: Treating RN: Mar 27, 1931 (88 y.o. F) Primary Care Provider: SYSTEM, PRO V IDER Other Clinician: Referring Provider: Treating Provider/Extender: [name] in Treatment: 22 Subjective Chief Complaint Information obtained from Patient Patient presents to the wound care center with open non-healing surgical  wound(s) History of Present Illness (HPI) ADMISSION 02/01/2022 This is an 88 year old woman who is a resident of [facility]. She has a past medical history notable for type 2 diabetes mellitus, congestive heart failure, pulmonary hypertension, and complete heart block. She is accompanied today by her son. Approximately 4 months ago, she had an ingrown toe nail removed. Apparently this went a bit deep. Since that time, it has not healed. The patient is nonambulatory. Apparently they have been applying Santyl to the site at her facility. She was on antibiotics but I am not certain which specific drugs she took. She completed her last course a few weeks ago. We were unable to obtain ABIs in clinic secondary to the patient's body habitus. She has not had any formal vascular studies nor any x-ray of the area. 02/09/2022: The wound is roughly the same size today, but the amount of slough in the base has decreased significantly. She apparently had an x-ray done at her facility, but we do not have the report; per verbal report from facility staff, "there was nothing there." We had requested formal ABI/TBI studies, but these were not done. She has been in [place] under [facility]	[place]. 02/16/2022: The wound measured about the same today, but to my eye, it appears rather smaller. The slough in the base is minimal. Her vascular studies are scheduled for Friday. We have been using Santyl under [facility]fer[facility]. No concern for infection. 02/23/2022: Once again, the wound has been measured about the same size, but I think it looks a bit smaller. There is slough at the base. She had her ABIs done last week and these show severe bilateral peripheral arterial disease. We received the report of the foot x-ray that was ordered. There is no concern for osteomyelitis at this time. 03/02/2022: The wound is smaller today with a small amount of slough at the base. We placed a referral to vein and vascular surgery last week in  response to her severe peripheral arterial disease, but she does not yet have an appointment on the books. 03/16/2022: She saw Dr. Lenell Antu in the vascular surgery department yesterday. He did not think she was a good candidate for any revascularization options and suggested that she continue local wound care for another month. If she fails to heal in that time, they may consider an angiogram with potential intervention. The wound today is roughly the same size but there is less exposed bone at the base and the surface is clean without any substantial slough. 03/24/2022: The wound appears about the  same. There is some periwound callus that has heaped up. We changed her to Prisma silver collagen last week. 04/06/2022: No significant change to the wound. It is neither improving nor deteriorating. She continues to accumulate some periwound callus around the margin. Bone is exposed at the base. 04/20/2022: The wound has improved quite a bit over the past 2 weeks. It is smaller and there is no real accumulation of periwound callus. She had follow-up with Dr. Lenell Antu yesterday and he felt that the wound was progressing well enough that no arterial intervention was necessary. 05/18/2022: The patient has been absent due to a fall from her wheelchair during her last transportation that was supposed to be to her clinic. Today, the wound is about the same size. There is still bone exposed at the base. There is periwound callus accumulation. No concern for infection. 06/01/2022: The wound is actually a little bit smaller today and the surface is not as pale. She still has accumulated periwound callus. 06/15/2022: The wound continues to contract, albeit slowly. No concern for infection. 06/28/2022: The wound is slightly smaller again today. She continues to accumulate callus and there is some periwound epibole at the margins. No concern for gross infection. 07/11/2022: The wound is roughly the same size with just a small  opening in the center. She has been approved for Grafix. Patient History Social History Never smoker, Marital Status - Widowed, Alcohol Use - Never, Drug Use - No History, Caffeine Use - Rarely. Medical History Cardiovascular Patient has history of Arrhythmia - AV block, complete heart block, Congestive Heart Failure, Hypertension Endocrine Patient has history of Type II Diabetes Musculoskeletal Patient has history of Gout, Osteoarthritis Medical A Surgical History Notes nd Cardiovascular Pulmonary Hypertension Endocrine Hypoglycemia Neurologic Acute encephalopathy Objective Constitutional No acute distress.. Vitals Time Taken: 12:49 PM, Temperature: 98.7 F, Pulse: 85 bpm, Respiratory Rate: 16 breaths/min, Blood Pressure: 116/55 mmHg. Respiratory Normal work of breathing on room air.. General Notes: 07/11/2022: The wound is roughly the same size with just a small opening in the center. Integumentary (Hair, Skin) Wound #1 status is Open. Original cause of wound was Gradually Appeared. The date acquired was: 09/29/2021. The wound has been in treatment 22 weeks. The wound is located on the Left T Great. The wound measures 0.3cm length x 0.2cm width x 0.2cm depth; 0.047cm^2 area and 0.009cm^3 volume. There is no oe tunneling or undermining noted. There is a medium amount of serous drainage noted. The wound margin is flat and intact. There is medium (34-66%) pink granulation within the wound bed. There is a medium (34-66%) amount of necrotic tissue within the wound bed including Adherent Slough. Assessment Active Problems ICD-10 Type 2 diabetes mellitus with foot ulcer Non-pressure chronic ulcer of other part of left foot with fat layer exposed Chronic diastolic (congestive) heart failure Pulmonary hypertension, unspecified Procedures Wound #1 Pre-procedure diagnosis of Wound #1 is a Diabetic Wound/Ulcer of the Lower Extremity located on the Left T Great .Severity of Tissue Pre  Debridement is: oe Fat layer exposed. There was a Selective/Open Wound Non-Viable Tissue Debridement with a total area of 0.06 sq cm performed by Duanne Guess, MD. With the following instrument(s): Curette to remove Non-Viable tissue/material. Material removed includes Eschar after achieving pain control using Lidocaine 5% topical ointment. No specimens were taken. A time out was conducted at 13:03, prior to the start of the procedure. A Minimum amount of bleeding was controlled with Pressure. The procedure was tolerated well with a pain level of 0  throughout and a pain level of 0 following the procedure. Post Debridement Measurements: 0.3cm length x 0.2cm width x 0.2cm depth; 0.009cm^3 volume. Character of Wound/Ulcer Post Debridement is improved. Severity of Tissue Post Debridement is: Fat layer exposed. Post procedure Diagnosis Wound #1: Same as Pre-Procedure Pre-procedure diagnosis of Wound #1 is a Diabetic Wound/Ulcer of the Lower Extremity located on the Left T Great. A skin graft procedure using a oe bioengineered skin substitute/cellular or tissue based product was performed by Duanne Guess, MD with the following instrument(s): Forceps and Scissors. Grafix prime was applied and secured with Steri-Strips. 2 sq cm of product was utilized and 0 sq cm was wasted. Post Application, Adaptic;gauze was applied. A Time Out was conducted at 13:03, prior to the start of the procedure. The procedure was tolerated well with a pain level of 0 throughout and a pain level of 0 following the procedure. Post procedure Diagnosis Wound #1: Same as Pre-Procedure General Notes: GRAFIX PL PRIME 43mm Disc. Plan Follow-up Appointments: Return Appointment in 1 week. - Dr Lady Gary Room 3 Monday 07/18/22 at 12:30pm Other: - Grafix #1 (07/11/22) Cellular or Tissue Based Products: Wound #1 Left T Great: oe Cellular or Tissue Based Product applied to wound bed, secured with steri-strips, cover with Adaptic or  Mepitel. (DO NOT REMOVE). - *** Do not remove dressing for a week**** Grafix (Disc) 07/11/22 #1 Bathing/ Shower/ Hygiene: May shower with protection but do not get wound dressing(s) wet. - Do Not Left Great T wet**** Skin Substitute (Grafix) applied 07/11/22**** oe Off-Loading: Other: - No direct pressure on toes WOUND #1: - T Great Wound Laterality: Left oe Cleanser: Wound Cleanser 1 x Per Week/30 Days Discharge Instructions: Cleanse the wound with wound cleanser prior to applying a clean dressing using gauze sponges, not tissue or cotton balls. Prim Dressing: Optifoam Non-Adhesive Dressing, 4x4 in 1 x Per Week/30 Days ary Discharge Instructions: Apply to Left Great toe to protect toe Prim Dressing: GRAFIX 1 x Per Week/30 Days ary Discharge Instructions: Skin Sub Secondary Dressing: Woven Gauze Sponges 2x2 in 1 x Per Week/30 Days Discharge Instructions: Apply over primary dressing as directed. Secured With: Insurance underwriter, Sterile 2x75 (in/in) 1 x Per Week/30 Days Discharge Instructions: Secure with stretch gauze as directed. Secured With: Paper T ape, 1x10 (in/yd) 1 x Per Week/30 Days Discharge Instructions: Secure dressing with tape as directed. 07/11/2022: The wound is roughly the same size with just a small opening in the center. I debrided a little bit of slough and callus from the wound. I then applied the Grafix in standard fashion, moistening with saline and securing it with Adaptic and Steri-Strips. A gauze sponge was used as a bolster. The toe was subsequently dressed. She will follow-up in 1 week. Electronic Signature(s) Signed: 07/12/2022 9:11:23 AM By: Duanne Guess MD FACS Previous Signature: 07/11/2022 1:21:43 PM Version By: Duanne Guess MD FACS Entered By: Duanne Guess on 07/12/2022 09:11:23 -------------------------------------------------------------------------------- HxROS Details Patient Name: Date of Service: [name], [name].  07/11/2022 12:30 PM Medical Record Number: [id number] Patient Account Number: [id number] Date of Birth/Sex: Treating RN: 05-10-1931 (88 y.o. F) Primary Care Provider: SYSTEM, PRO V IDER Other Clinician: Referring Provider: Treating Provider/Extender: [name] in Treatment: 22 Cardiovascular Medical History: Positive for: Arrhythmia - AV block, complete heart block; Congestive Heart Failure; Hypertension Past Medical History Notes: Pulmonary Hypertension Endocrine Medical History: Positive for: Type II Diabetes Past Medical History Notes: Hypoglycemia Treated with: Oral agents Blood sugar tested every day: Yes Tested :  2-3x a day Musculoskeletal Medical History: Positive for: Gout; Osteoarthritis Neurologic Medical History: Past Medical History Notes: Acute encephalopathy Immunizations Pneumococcal Vaccine: Received Pneumococcal Vaccination: No Implantable Devices No devices added Family and Social History Never smoker; Marital Status - Widowed; Alcohol Use: Never; Drug Use: No History; Caffeine Use: Rarely; Financial Concerns: No; Food, Clothing or Shelter Needs: No; Support System Lacking: No; Transportation Concerns: No Psychologist, prison and probation services) Signed: 07/11/2022 1:22:44 PM By: Duanne Guess MD FACS Entered By: Duanne Guess on 07/11/2022 13:19:56 -------------------------------------------------------------------------------- SuperBill Details Patient Name: Date of Service: [name] 07/11/2022 Medical Record Number: [id number] Patient Account Number: [id number] Date of Birth/Sex: Treating RN: 1931/10/04 (88 y.o. F) Primary Care Provider: SYSTEM, PRO V IDER Other Clinician: Referring Provider: Treating Provider/Extender: [name] in Treatment: 22 Diagnosis Coding ICD-10 Codes Code Description E11.621 Type 2 diabetes mellitus with foot ulcer L97.522 Non-pressure chronic ulcer of other part of left foot with fat layer  exposed I50.32 Chronic diastolic (congestive) heart failure I27.20 Pulmonary hypertension, unspecified Facility Procedures CPT4 Code: 38756433 Description: 15275 - SKIN SUB GRAFT FACE/NK/HF/G ICD-10 Diagnosis Description L97.522 Non-pressure chronic ulcer of other part of left foot with fat layer exposed Modifier: Quantity: 1 CPT4 Code: 29518841 Description: Q4133- Grafix PL 16 mm disc ICD-10 Diagnosis Description L97.522 Non-pressure chronic ulcer of other part of left foot with fat layer exposed Modifier: Quantity: 2 Physician Procedures : CPT4 Code Description Modifier [id number] 99213 - WC PHYS LEVEL 3 - EST PT 25 ICD-10 Diagnosis Description L97.522 Non-pressure chronic ulcer of other part of left foot with fat layer exposed E11.621 Type 2 diabetes mellitus with foot ulcer I50.32 Chronic  diastolic (congestive) heart failure I27.20 Pulmonary hypertension, unspecified Quantity: 1 : [id number] 15275 - WC PHYS SKIN SUB GRAFT FACE/NK/HF/G ICD-10 Diagnosis Description L97.522 Non-pressure chronic ulcer of other part of left foot with fat layer exposed Quantity: 1 Electronic Signature(s) Signed: 07/21/2022 4:13:53 PM By: Shearon Stalls Signed: 08/04/2022 10:03:28 AM By: Duanne Guess MD FACS Previous Signature: 07/21/2022 2:04:07 PM Version By: Shearon Stalls Previous Signature: 07/21/2022 9:10:58 AM Version By: Shawn Stall RN, BSN Previous Signature: 07/11/2022 1:22:25 PM Version By: Duanne Guess MD FACS Entered By: Shearon Stalls on 07/21/2022 16:13:53

## 2022-07-13 ENCOUNTER — Encounter (HOSPITAL_BASED_OUTPATIENT_CLINIC_OR_DEPARTMENT_OTHER): Payer: Medicare Other | Admitting: General Surgery

## 2022-07-18 ENCOUNTER — Encounter (HOSPITAL_BASED_OUTPATIENT_CLINIC_OR_DEPARTMENT_OTHER): Payer: Medicare Other | Admitting: General Surgery

## 2022-07-18 DIAGNOSIS — E11621 Type 2 diabetes mellitus with foot ulcer: Secondary | ICD-10-CM | POA: Diagnosis not present

## 2022-07-18 NOTE — Progress Notes (Signed)
JEWELLE, WHITNER ([id number]) Visit Report for 07/18/2022 Arrival Information Details Patient Name: Date of Service: [name], [name] 07/18/2022 12:30 PM Medical Record Number: [id number] Patient Account Number: [id number] Date of Birth/Sex: Treating RN: 29-Sep-1931 (88 y.o. [name], [name] Primary Care Philip Kotlyar: SYSTEM, PRO V IDER Other Clinician: Referring Akisha Sturgill: Treating Olyvia Gopal/Extender: Jadene Pierini in Treatment: 23 Visit Information History Since Last Visit Added or deleted any medications: No Patient Arrived: Wheel Chair Any new allergies or adverse reactions: No Arrival Time: 12:37 Had a fall or experienced change in No Accompanied By: son activities of daily living that may affect Transfer Assistance: Manual risk of falls: Patient Identification Verified: Yes Signs or symptoms of abuse/neglect since last visito No Patient Requires Transmission-Based Precautions: No Hospitalized since last visit: No Patient Has Alerts: Yes Implantable device outside of the clinic excluding No Patient Alerts: ABI not obtainable cellular tissue based products placed in the center since last visit: Has Dressing in Place as Prescribed: Yes Pain Present Now: No Electronic Signature(s) Signed: 07/18/2022 5:46:04 PM By: Dellie Catholic RN Entered By: Dellie Catholic on 07/18/2022 12:41:45 -------------------------------------------------------------------------------- Encounter Discharge Information Details Patient Name: Date of Service: [name], [name] Camps. 07/18/2022 12:30 PM Medical Record Number: [id number] Patient Account Number: [id number] Date of Birth/Sex: Treating RN: 09/19/1931 (88 y.o. [name] Primary Care Timber Marshman: SYSTEM, PRO Ave Filter Other Clinician: Referring Rosabella Edgin: Treating Michiel Sivley/Extender: Jadene Pierini in Treatment: 23 Encounter Discharge Information Items Post Procedure Vitals Discharge Condition: Stable Temperature (F):  98.7 Ambulatory Status: Wheelchair Pulse (bpm): 71 Discharge Destination: Home Respiratory Rate (breaths/min): 18 Transportation: Private Auto Blood Pressure (mmHg): 166/78 Accompanied By: son Schedule Follow-up Appointment: Yes Clinical Summary of Care: Patient Declined Electronic Signature(s) Signed: 07/18/2022 5:46:04 PM By: Dellie Catholic RN Entered By: Dellie Catholic on 07/18/2022 17:34:30 -------------------------------------------------------------------------------- Lower Extremity Assessment Details Patient Name: Date of Service: [name], [name] 07/18/2022 12:30 PM Medical Record Number: [id number] Patient Account Number: [id number] Date of Birth/Sex: Treating RN: 08-05-1931 (88 y.o. [name] Primary Care Madylyn Insco: SYSTEM, PRO Ave Filter Other Clinician: Referring Orman Matsumura: Treating Carlia Bomkamp/Extender: Jadene Pierini in Treatment: 23 Edema Assessment Assessed: [Left: No] [Right: No] Edema: [Left: Ye] [Right: s] Calf Left: Right: Point of Measurement: 26 cm From Medial Instep 39 cm Ankle Left: Right: Point of Measurement: 10 cm From Medial Instep 25 cm Electronic Signature(s) Signed: 07/18/2022 5:46:04 PM By: Dellie Catholic RN Entered By: Dellie Catholic on 07/18/2022 12:43:06 -------------------------------------------------------------------------------- Multi Wound Chart Details Patient Name: Date of Service: [name] Gable. 07/18/2022 12:30 PM Medical Record Number: [id number] Patient Account Number: [id number] Date of Birth/Sex: Treating RN: [birth year] (88 y.o. [name] Primary Care Heylee Tant: SYSTEM, PRO Ave Filter Other Clinician: Referring Tiawana Forgy: Treating Italia Wolfert/Extender: Jadene Pierini in Treatment: 23 Vital Signs Height(in): Pulse(bpm): 87 Weight(lbs): Blood Pressure(mmHg): 166/78 Body Mass Index(BMI): Temperature(F): 98.7 Respiratory Rate(breaths/min): 18 Photos: [N/A:N/A] Left T Great oe N/A  N/A Wound Location: Gradually Appeared N/A N/A Wounding Event: Diabetic Wound/Ulcer of the Lower N/A N/A Primary Etiology: Extremity Arrhythmia, Congestive Heart Failure, N/A N/A Comorbid History: Hypertension, Type II Diabetes, Gout, Osteoarthritis 09/29/2021 N/A N/A Date Acquired: 29 N/A N/A Weeks of Treatment: Open N/A N/A Wound Status: No N/A N/A Wound Recurrence: 0.2x0.2x0.1 N/A N/A Measurements L x W x D (cm) 0.031 N/A N/A A (cm) : rea 0.003 N/A N/A Volume (cm) : 89.00% N/A N/A % Reduction in A rea: 94.70% N/A N/A % Reduction in Volume: Grade 2 N/A N/A Classification: Medium N/A N/A Exudate  A mount: Serous N/A N/A Exudate Type: amber N/A N/A Exudate Color: Flat and Intact N/A N/A Wound Margin: Small (1-33%) N/A N/A Granulation A mount: Pink N/A N/A Granulation Quality: Large (67-100%) N/A N/A Necrotic A mount: Fat Layer (Subcutaneous Tissue): Yes N/A N/A Exposed Structures: Fascia: No Tendon: No Muscle: No Joint: No Bone: No Small (1-33%) N/A N/A Epithelialization: Debridement - Selective/Open Wound N/A N/A Debridement: Pre-procedure Verification/Time Out 13:00 N/A N/A Taken: Lidocaine 5% topical ointment N/A N/A Pain Control: Necrotic/Eschar N/A N/A Tissue Debrided: Non-Viable Tissue N/A N/A Level: 0.04 N/A N/A Debridement A (sq cm): rea Curette N/A N/A Instrument: Minimum N/A N/A Bleeding: Pressure N/A N/A Hemostasis A chieved: 0 N/A N/A Procedural Pain: 0 N/A N/A Post Procedural Pain: Procedure was tolerated well N/A N/A Debridement Treatment Response: 0.2x0.2x0.1 N/A N/A Post Debridement Measurements L x W x D (cm) 0.003 N/A N/A Post Debridement Volume: (cm) Debridement N/A N/A Procedures Performed: Treatment Notes Electronic Signature(s) Signed: 07/18/2022 1:18:11 PM By: Fredirick Maudlin MD FACS Signed: 07/18/2022 5:46:04 PM By: Dellie Catholic RN Entered By: Fredirick Maudlin on 07/18/2022  13:18:11 -------------------------------------------------------------------------------- Multi-Disciplinary Care Plan Details Patient Name: Date of Service: [name] Gable. 07/18/2022 12:30 PM Medical Record Number: [id number] Patient Account Number: [id number] Date of Birth/Sex: Treating RN: 13-Mar-1931 (88 y.o. [name] Primary Care Atif Chapple: SYSTEM, PRO Ave Filter Other Clinician: Referring Lucerito Rosinski: Treating Avo Schlachter/Extender: Jadene Pierini in Treatment: 23 Multidisciplinary Care Plan reviewed with physician Active Inactive Nutrition Nursing Diagnoses: Impaired glucose control: actual or potential Potential for alteratiion in Nutrition/Potential for imbalanced nutrition Goals: Patient/caregiver will maintain therapeutic glucose control Date Initiated: 02/01/2022 Target Resolution Date: 08/20/2022 Goal Status: Active Interventions: Assess HgA1c results as ordered upon admission and as needed Assess patient nutrition upon admission and as needed per policy Provide education on elevated blood sugars and impact on wound healing Provide education on nutrition Treatment Activities: Education provided on Nutrition : 02/01/2022 Notes: Wound/Skin Impairment Nursing Diagnoses: Impaired tissue integrity Knowledge deficit related to ulceration/compromised skin integrity Goals: Patient/caregiver will verbalize understanding of skin care regimen Date Initiated: 02/01/2022 Date Inactivated: 05/18/2022 Target Resolution Date: 04/28/2022 Goal Status: Met Ulcer/skin breakdown will have a volume reduction of 30% by week 4 Date Initiated: 02/01/2022 Date Inactivated: 03/24/2022 Target Resolution Date: 03/31/2022 Goal Status: Unmet Unmet Reason: PAD Ulcer/skin breakdown will have a volume reduction of 50% by week 8 Date Initiated: 04/06/2022 Target Resolution Date: 08/20/2022 Goal Status: Active Interventions: Assess patient/caregiver ability to obtain necessary  supplies Assess patient/caregiver ability to perform ulcer/skin care regimen upon admission and as needed Assess ulceration(s) every visit Provide education on ulcer and skin care Notes: Electronic Signature(s) Signed: 07/18/2022 5:46:04 PM By: Dellie Catholic RN Entered By: Dellie Catholic on 07/18/2022 13:22:25 -------------------------------------------------------------------------------- Pain Assessment Details Patient Name: Date of Service: [name] Gable 07/18/2022 12:30 PM Medical Record Number: [id number] Patient Account Number: [id number] Date of Birth/Sex: Treating RN: [birth year] (88 y.o. [name] Primary Care Ling Flesch: SYSTEM, PRO Ave Filter Other Clinician: Referring Makylah Bossard: Treating Zuriel Yeaman/Extender: Jadene Pierini in Treatment: 23 Active Problems Location of Pain Severity and Description of Pain Patient Has Paino No Site Locations Pain Management and Medication Current Pain Management: Electronic Signature(s) Signed: 07/18/2022 5:46:04 PM By: Dellie Catholic RN Entered By: Dellie Catholic on 07/18/2022 12:42:13 -------------------------------------------------------------------------------- Patient/Caregiver Education Details Patient Name: Date of Service: [name] Gable 8/28/2023andnbsp12:30 PM Medical Record Number: [id number] Patient Account Number: [id number] Date of Birth/Gender: Treating RN: 1931/02/22 (88 y.o. [name] Primary Care Physician: SYSTEM,  PRO V IDER Other Clinician: Referring Physician: Treating Physician/Extender: Jadene Pierini in Treatment: 23 Education Assessment Education Provided To: Patient Education Topics Provided Wound/Skin Impairment: Methods: Explain/Verbal Responses: Return demonstration correctly Electronic Signature(s) Signed: 07/18/2022 5:46:04 PM By: Dellie Catholic RN Entered By: Dellie Catholic on 07/18/2022  13:22:40 -------------------------------------------------------------------------------- Wound Assessment Details Patient Name: Date of Service: [name] Gable 07/18/2022 12:30 PM Medical Record Number: [id number] Patient Account Number: [id number] Date of Birth/Sex: Treating RN: 10/06/1931 (88 y.o. [name] Primary Care Toniqua Melamed: SYSTEM, PRO V IDER Other Clinician: Referring Nigel Wessman: Treating Masiyah Engen/Extender: Jadene Pierini in Treatment: 23 Wound Status Wound Number: 1 Primary Diabetic Wound/Ulcer of the Lower Extremity Etiology: Wound Location: Left T Great oe Wound Open Wounding Event: Gradually Appeared Status: Date Acquired: 09/29/2021 Comorbid Arrhythmia, Congestive Heart Failure, Hypertension, Type II Weeks Of Treatment: 23 History: Diabetes, Gout, Osteoarthritis Clustered Wound: No Photos Wound Measurements Length: (cm) 0.2 Width: (cm) 0.2 Depth: (cm) 0.1 Area: (cm) 0.031 Volume: (cm) 0.003 % Reduction in Area: 89% % Reduction in Volume: 94.7% Epithelialization: Small (1-33%) Tunneling: No Undermining: No Wound Description Classification: Grade 2 Wound Margin: Flat and Intact Exudate Amount: Medium Exudate Type: Serous Exudate Color: amber Foul Odor After Cleansing: No Slough/Fibrino Yes Wound Bed Granulation Amount: Small (1-33%) Exposed Structure Granulation Quality: Pink Fascia Exposed: No Necrotic Amount: Large (67-100%) Fat Layer (Subcutaneous Tissue) Exposed: Yes Necrotic Quality: Adherent Slough Tendon Exposed: No Muscle Exposed: No Joint Exposed: No Bone Exposed: No Treatment Notes Wound #1 (Toe Great) Wound Laterality: Left Cleanser Wound Cleanser Discharge Instruction: Cleanse the wound with wound cleanser prior to applying a clean dressing using gauze sponges, not tissue or cotton balls. Peri-Wound Care Topical Primary Dressing Optifoam Non-Adhesive Dressing, 4x4 in Discharge Instruction: Apply to Left  Great toe to protect toe GRAFIX Discharge Instruction: Skin Sub Secondary Dressing Woven Gauze Sponges 2x2 in Discharge Instruction: Apply over primary dressing as directed. Secured With Conforming Stretch Gauze Bandage, Sterile 2x75 (in/in) Discharge Instruction: Secure with stretch gauze as directed. Paper Tape, 1x10 (in/yd) Discharge Instruction: Secure dressing with tape as directed. Compression Wrap Compression Stockings Add-Ons Electronic Signature(s) Signed: 07/18/2022 5:46:04 PM By: Dellie Catholic RN Entered By: Dellie Catholic on 07/18/2022 12:48:53 -------------------------------------------------------------------------------- Vitals Details Patient Name: Date of Service: [name], [name] Camps. 07/18/2022 12:30 PM Medical Record Number: [id number] Patient Account Number: [id number] Date of Birth/Sex: Treating RN: 1931-09-14 (88 y.o. [name] Primary Care Lui Bellis: SYSTEM, PRO V IDER Other Clinician: Referring Demaya Hardge: Treating Keshawn Fiorito/Extender: Jadene Pierini in Treatment: 23 Vital Signs Time Taken: 12:42 Temperature (F): 98.7 Pulse (bpm): 71 Respiratory Rate (breaths/min): 18 Blood Pressure (mmHg): 166/78 Reference Range: 80 - 120 mg / dl Electronic Signature(s) Signed: 07/18/2022 5:46:04 PM By: Dellie Catholic RN Entered By: Dellie Catholic on 07/18/2022 12:42:05

## 2022-07-19 NOTE — Progress Notes (Signed)
[name], [name] ([id number]) Visit Report for 07/18/2022 Chief Complaint Document Details Patient Name: Date of Service: [name], [name] 07/18/2022 12:30 PM Medical Record Number: [id number] Patient Account Number: [id number] Date of Birth/Sex: Treating RN: Feb 28, 1931 (88 y.o. [name] Primary Care Provider: SYSTEM, PRO Ave Filter Other Clinician: Referring Provider: Treating Provider/Extender: Jadene Pierini in Treatment: 23 Information Obtained from: Patient Chief Complaint Patient presents to the wound care center with open non-healing surgical wound(s) Electronic Signature(s) Signed: 07/18/2022 1:18:17 PM By: Fredirick Maudlin MD FACS Entered By: Fredirick Maudlin on 07/18/2022 13:18:16 -------------------------------------------------------------------------------- Cellular or Tissue Based Product Details Patient Name: Date of Service: [name]. 07/18/2022 12:30 PM Medical Record Number: [id number] Patient Account Number: [id number] Date of Birth/Sex: Treating RN: 1931-02-13 (88 y.o. [name] Primary Care Provider: SYSTEM, PRO Ave Filter Other Clinician: Referring Provider: Treating Provider/Extender: Jadene Pierini in Treatment: 23 Cellular or Tissue Based Product Type Wound #1 Left T Great oe Applied to: Performed By: Physician Fredirick Maudlin, MD Cellular or Tissue Based Product Type: Grafix prime Level of Consciousness (Pre-procedure): Awake and Alert Pre-procedure Verification/Time Out Yes - 13:00 Taken: Location: genitalia / hands / feet / multiple digits Wound Size (sq cm): 0.04 Product Size (sq cm): 2 Waste Size (sq cm): 0 Amount of Product Applied (sq cm): 2 Instrument Used: Forceps, Scissors Lot #: [id number] Order #: 2 Expiration Date: 11/25/2023 Fenestrated: No Reconstituted: Yes Solution Type: Normal Saline Solution Amount: 3 Lot #: [id number] Solution Expiration Date: 08/21/2022 Secured: Yes Secured With:  Steri-Strips Dressing Applied: Yes Primary Dressing: Adaptic;gauze Procedural Pain: 0 Post Procedural Pain: 0 Response to Treatment: Procedure was tolerated well Level of Consciousness (Post- Awake and Alert procedure): Post Procedure Diagnosis Same as Pre-procedure Electronic Signature(s) Signed: 07/18/2022 5:46:04 PM By: Dellie Catholic RN Signed: 07/19/2022 8:54:38 AM By: Fredirick Maudlin MD FACS Entered By: Dellie Catholic on 07/18/2022 17:33:17 -------------------------------------------------------------------------------- Debridement Details Patient Name: Date of Service: [name]. 07/18/2022 12:30 PM Medical Record Number: [id number] Patient Account Number: [id number] Date of Birth/Sex: Treating RN: Apr 19, 1931 (88 y.o. [name] Primary Care Provider: SYSTEM, PRO V IDER Other Clinician: Referring Provider: Treating Provider/Extender: Jadene Pierini in Treatment: 23 Debridement Performed for Assessment: Wound #1 Left T Great oe Performed By: Physician Fredirick Maudlin, MD Debridement Type: Debridement Severity of Tissue Pre Debridement: Fat layer exposed Level of Consciousness (Pre-procedure): Awake and Alert Pre-procedure Verification/Time Out Yes - 13:00 Taken: Start Time: 13:00 Pain Control: Lidocaine 5% topical ointment T Area Debrided (L x W): otal 0.2 (cm) x 0.2 (cm) = 0.04 (cm) Tissue and other material debrided: Non-Viable, Eschar Level: Non-Viable Tissue Debridement Description: Selective/Open Wound Instrument: Curette Bleeding: Minimum Hemostasis Achieved: Pressure End Time: 13:01 Procedural Pain: 0 Post Procedural Pain: 0 Response to Treatment: Procedure was tolerated well Level of Consciousness (Post- Awake and Alert procedure): Post Debridement Measurements of Total Wound Length: (cm) 0.2 Width: (cm) 0.2 Depth: (cm) 0.1 Volume: (cm) 0.003 Character of Wound/Ulcer Post Debridement: Improved Severity of Tissue Post  Debridement: Fat layer exposed Post Procedure Diagnosis Same as Pre-procedure Electronic Signature(s) Signed: 07/18/2022 1:46:15 PM By: Fredirick Maudlin MD FACS Signed: 07/18/2022 5:46:04 PM By: Dellie Catholic RN Entered By: Dellie Catholic on 07/18/2022 13:06:25 -------------------------------------------------------------------------------- HPI Details Patient Name: Date of Service: [name]. 07/18/2022 12:30 PM Medical Record Number: [id number] Patient Account Number: [id number] Date of Birth/Sex: Treating RN: September 13, 1931 (88 y.o. America [name] Primary Care Provider: SYSTEM, PRO Ave Filter Other Clinician: Referring Provider: Treating  Provider/Extender: [name] in Treatment: 23 History of Present Illness HPI Description: ADMISSION 02/01/2022 This is an 88 year old woman who is a resident of [facility]. She has a past medical history notable for type 2 diabetes mellitus, congestive heart failure, pulmonary hypertension, and complete heart block. She is accompanied today by her son. Approximately 4 months ago, she had an ingrown toe nail removed. Apparently this went a bit deep. Since that time, it has not healed. The patient is nonambulatory. Apparently they have been applying Santyl to the site at her facility. She was on antibiotics but I am not certain which specific drugs she took. She completed her last course a few weeks ago. We were unable to obtain ABIs in clinic secondary to the patient's body habitus. She has not had any formal vascular studies nor any x-ray of the area. 02/09/2022: The wound is roughly the same size today, but the amount of slough in the base has decreased significantly. She apparently had an x-ray done at her facility, but we do not have the report; per verbal report from facility staff, "there was nothing there." We had requested formal ABI/TBI studies, but these were not done. She has been in [place] under [employer]	[place]. 02/16/2022:  The wound measured about the same today, but to my eye, it appears rather smaller. The slough in the base is minimal. Her vascular studies are scheduled for Friday. We have been using Santyl under Hydrofera Blue. No concern for infection. 02/23/2022: Once again, the wound has been measured about the same size, but I think it looks a bit smaller. There is slough at the base. She had her ABIs done last week and these show severe bilateral peripheral arterial disease. We received the report of the foot x-ray that was ordered. There is no concern for osteomyelitis at this time. 03/02/2022: The wound is smaller today with a small amount of slough at the base. We placed a referral to vein and vascular surgery last week in response to her severe peripheral arterial disease, but she does not yet have an appointment on the books. 03/16/2022: She saw Dr. Lenell Antu in the vascular surgery department yesterday. He did not think she was a good candidate for any revascularization options and suggested that she continue local wound care for another month. If she fails to heal in that time, they may consider an angiogram with potential intervention. The wound today is roughly the same size but there is less exposed bone at the base and the surface is clean without any substantial slough. 03/24/2022: The wound appears about the same. There is some periwound callus that has heaped up. We changed her to Prisma silver collagen last week. 04/06/2022: No significant change to the wound. It is neither improving nor deteriorating. She continues to accumulate some periwound callus around the margin. Bone is exposed at the base. 04/20/2022: The wound has improved quite a bit over the past 2 weeks. It is smaller and there is no real accumulation of periwound callus. She had follow-up with Dr. Lenell Antu yesterday and he felt that the wound was progressing well enough that no arterial intervention was necessary. 05/18/2022: The patient has been  absent due to a fall from her wheelchair during her last transportation that was supposed to be to her clinic. Today, the wound is about the same size. There is still bone exposed at the base. There is periwound callus accumulation. No concern for infection. 06/01/2022: The wound is actually a little bit smaller today  and the surface is not as pale. She still has accumulated periwound callus. 06/15/2022: The wound continues to contract, albeit slowly. No concern for infection. 06/28/2022: The wound is slightly smaller again today. She continues to accumulate callus and there is some periwound epibole at the margins. No concern for gross infection. 07/11/2022: The wound is roughly the same size with just a small opening in the center. She has been approved for Grafix. 07/18/2022: The wound has contracted somewhat and the surface has a healthier pink look to it. We applied Grafix last week and she is here for application #2. Electronic Signature(s) Signed: 07/18/2022 1:18:49 PM By: Fredirick Maudlin MD FACS Entered By: Fredirick Maudlin on 07/18/2022 13:18:49 -------------------------------------------------------------------------------- Physical Exam Details Patient Name: Date of Service: [name]. 07/18/2022 12:30 PM Medical Record Number: [id number] Patient Account Number: [id number] Date of Birth/Sex: Treating RN: 14-Dec-1930 (88 y.o. America Sokolowski Primary Care Provider: SYSTEM, PRO Ave Filter Other Clinician: Referring Provider: Treating Provider/Extender: Jadene Pierini in Treatment: 23 Constitutional Hypertensive, asymptomatic. . . . No acute distress.[name] Respiratory Normal work of breathing on room air.. Notes 07/18/2022: The wound has contracted somewhat and the surface has a healthier pink look to it. Electronic Signature(s) Signed: 07/18/2022 1:19:19 PM By: Fredirick Maudlin MD FACS Entered By: Fredirick Maudlin on 07/18/2022  13:19:19 -------------------------------------------------------------------------------- Physician Orders Details Patient Name: Date of Service: [name]. 07/18/2022 12:30 PM Medical Record Number: [id number] Patient Account Number: [id number] Date of Birth/Sex: Treating RN: [birth year] (88 y.o. America Fickle Primary Care Provider: SYSTEM, PRO V IDER Other Clinician: Referring Provider: Treating Provider/Extender: Jadene Pierini in Treatment: 23 Verbal / Phone Orders: No Diagnosis Coding ICD-10 Coding Code Description E11.621 Type 2 diabetes mellitus with foot ulcer L97.522 Non-pressure chronic ulcer of other part of left foot with fat layer exposed 99991111 Chronic diastolic (congestive) heart failure I27.20 Pulmonary hypertension, unspecified Follow-up Appointments ppointment in 1 week. - Dr Celine Ahr Room 3 Thursday 07/28/22 at 12:30pm Return A Other: - Grafix #1 (07/11/22) Cellular or Tissue Based Products Wound #1 Left T Great oe daptic or Mepitel. (DO NOT REMOVE). - *** Cellular or Tissue Based Product applied to wound bed, secured with steri-strips, cover with A Do not remove dressing for a week**** Grafix (Disc) 07/18/22 #2 Bathing/ Shower/ Hygiene May shower with protection but do not get wound dressing(s) wet. - Do Not Left Great T wet**** Skin Substitute (Grafix) applied 07/18/22**** oe Off-Loading Other: - No direct pressure on toes Wound Treatment Wound #1 - T Great oe Wound Laterality: Left Cleanser: Wound Cleanser 1 x Per Week/30 Days Discharge Instructions: Cleanse the wound with wound cleanser prior to applying a clean dressing using gauze sponges, not tissue or cotton balls. Prim Dressing: Optifoam Non-Adhesive Dressing, 4x4 in 1 x Per Week/30 Days ary Discharge Instructions: Apply to Left Great toe to protect toe Prim Dressing: GRAFIX 1 x Per Week/30 Days ary Discharge Instructions: Skin Sub Secondary Dressing: Woven Gauze Sponges 2x2 in 1  x Per Week/30 Days Discharge Instructions: Apply over primary dressing as directed. Secured With: Child psychotherapist, Sterile 2x75 (in/in) 1 x Per Week/30 Days Discharge Instructions: Secure with stretch gauze as directed. Secured With: Paper Tape, 1x10 (in/yd) 1 x Per Week/30 Days Discharge Instructions: Secure dressing with tape as directed. Electronic Signature(s) Signed: 07/18/2022 1:46:15 PM By: Fredirick Maudlin MD FACS Entered By: Fredirick Maudlin on 07/18/2022 13:19:52 -------------------------------------------------------------------------------- Problem List Details Patient Name: Date of Service: [name], [name] 07/18/2022 12:30 PM Medical  Record Number: [id number] Patient Account Number: [id number] Date of Birth/Sex: Treating RN: 15-Jan-1931 (88 y.o. America Sestak Primary Care Provider: SYSTEM, PRO Ave Filter Other Clinician: Referring Provider: Treating Provider/Extender: Jadene Pierini in Treatment: 23 Active Problems ICD-10 Encounter Code Description Active Date MDM Diagnosis E11.621 Type 2 diabetes mellitus with foot ulcer 02/01/2022 No Yes L97.522 Non-pressure chronic ulcer of other part of left foot with fat layer exposed 02/01/2022 No Yes 99991111 Chronic diastolic (congestive) heart failure 02/01/2022 No Yes I27.20 Pulmonary hypertension, unspecified 02/01/2022 No Yes Inactive Problems Resolved Problems Electronic Signature(s) Signed: 07/18/2022 1:18:06 PM By: Fredirick Maudlin MD FACS Entered By: Fredirick Maudlin on 07/18/2022 13:18:06 -------------------------------------------------------------------------------- Progress Note Details Patient Name: Date of Service: [name]. 07/18/2022 12:30 PM Medical Record Number: [id number] Patient Account Number: [id number] Date of Birth/Sex: Treating RN: 11-09-1931 (88 y.o. America Jacober Primary Care Provider: SYSTEM, PRO Ave Filter Other Clinician: Referring Provider: Treating  Provider/Extender: Jadene Pierini in Treatment: 23 Subjective Chief Complaint Information obtained from Patient Patient presents to the wound care center with open non-healing surgical wound(s) History of Present Illness (HPI) ADMISSION 02/01/2022 This is an 88 year old woman who is a resident of [place]. She has a past medical history notable for type 2 diabetes mellitus, congestive heart failure, pulmonary hypertension, and complete heart block. She is accompanied today by her son. Approximately 4 months ago, she had an ingrown toe nail removed. Apparently this went a bit deep. Since that time, it has not healed. The patient is nonambulatory. Apparently they have been applying Santyl to the site at her facility. She was on antibiotics but I am not certain which specific drugs she took. She completed her last course a few weeks ago. We were unable to obtain ABIs in clinic secondary to the patient's body habitus. She has not had any formal vascular studies nor any x-ray of the area. 02/09/2022: The wound is roughly the same size today, but the amount of slough in the base has decreased significantly. She apparently had an x-ray done at her facility, but we do not have the report; per verbal report from facility staff, "there was nothing there." We had requested formal ABI/TBI studies, but these were not done. She has been in [place] under [employer]. 02/16/2022: The wound measured about the same today, but to my eye, it appears rather smaller. The slough in the base is minimal. Her vascular studies are scheduled for Friday. We have been using Santyl under Hydrofera Blue. No concern for infection. 02/23/2022: Once again, the wound has been measured about the same size, but I think it looks a bit smaller. There is slough at the base. She had her ABIs done last week and these show severe bilateral peripheral arterial disease. We received the report of the foot x-ray that was ordered.  There is no concern for osteomyelitis at this time. 03/02/2022: The wound is smaller today with a small amount of slough at the base. We placed a referral to vein and vascular surgery last week in response to her severe peripheral arterial disease, but she does not yet have an appointment on the books. 03/16/2022: She saw Dr. Stanford Breed in the vascular surgery department yesterday. He did not think she was a good candidate for any revascularization options and suggested that she continue local wound care for another month. If she fails to heal in that time, they may consider an angiogram with potential intervention. The wound today is roughly the same size but there  is less exposed bone at the base and the surface is clean without any substantial slough. 03/24/2022: The wound appears about the same. There is some periwound callus that has heaped up. We changed her to Prisma silver collagen last week. 04/06/2022: No significant change to the wound. It is neither improving nor deteriorating. She continues to accumulate some periwound callus around the margin. Bone is exposed at the base. 04/20/2022: The wound has improved quite a bit over the past 2 weeks. It is smaller and there is no real accumulation of periwound callus. She had follow-up with Dr. Stanford Breed yesterday and he felt that the wound was progressing well enough that no arterial intervention was necessary. 05/18/2022: The patient has been absent due to a fall from her wheelchair during her last transportation that was supposed to be to her clinic. Today, the wound is about the same size. There is still bone exposed at the base. There is periwound callus accumulation. No concern for infection. 06/01/2022: The wound is actually a little bit smaller today and the surface is not as pale. She still has accumulated periwound callus. 06/15/2022: The wound continues to contract, albeit slowly. No concern for infection. 06/28/2022: The wound is slightly smaller again  today. She continues to accumulate callus and there is some periwound epibole at the margins. No concern for gross infection. 07/11/2022: The wound is roughly the same size with just a small opening in the center. She has been approved for Grafix. 07/18/2022: The wound has contracted somewhat and the surface has a healthier pink look to it. We applied Grafix last week and she is here for application #2. Patient History Social History Never smoker, Marital Status - Widowed, Alcohol Use - Never, Drug Use - No History, Caffeine Use - Rarely. Medical History Cardiovascular Patient has history of Arrhythmia - AV block, complete heart block, Congestive Heart Failure, Hypertension Endocrine Patient has history of Type II Diabetes Musculoskeletal Patient has history of Gout, Osteoarthritis Medical A Surgical History Notes nd Cardiovascular Pulmonary Hypertension Endocrine Hypoglycemia Neurologic Acute encephalopathy Objective Constitutional Hypertensive, asymptomatic. No acute distress.. Vitals Time Taken: 12:42 PM, Temperature: 98.7 F, Pulse: 71 bpm, Respiratory Rate: 18 breaths/min, Blood Pressure: 166/78 mmHg. Respiratory Normal work of breathing on room air.. General Notes: 07/18/2022: The wound has contracted somewhat and the surface has a healthier pink look to it. Integumentary (Hair, Skin) Wound #1 status is Open. Original cause of wound was Gradually Appeared. The date acquired was: 09/29/2021. The wound has been in treatment 23 weeks. The wound is located on the Left T Great. The wound measures 0.2cm length x 0.2cm width x 0.1cm depth; 0.031cm^2 area and 0.003cm^3 volume. There is Fat oe Layer (Subcutaneous Tissue) exposed. There is no tunneling or undermining noted. There is a medium amount of serous drainage noted. The wound margin is flat and intact. There is small (1-33%) pink granulation within the wound bed. There is a large (67-100%) amount of necrotic tissue within the wound  bed including Adherent Slough. 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 There was a Selective/Open Wound Non-Viable Tissue Debridement with a total area of 0.04 sq cm performed by Fredirick Maudlin, MD. With  the following instrument(s): Curette to remove Non-Viable tissue/material. Material removed includes Eschar after achieving pain control using Lidocaine 5% topical ointment. No specimens were taken. A time out was conducted at 13:00, prior to the start of the procedure. A Minimum amount of bleeding was controlled with Pressure. The procedure was tolerated well with a pain level of 0 throughout and a pain level of 0 following the procedure. Post Debridement Measurements: 0.2cm length x 0.2cm width x 0.1cm depth; 0.003cm^3 volume. Character of Wound/Ulcer Post Debridement is improved. Severity of Tissue Post Debridement is: Fat layer exposed. Post procedure Diagnosis Wound #1: Same as Pre-Procedure Pre-procedure diagnosis of Wound #1 is a Diabetic Wound/Ulcer of the Lower Extremity located on the Left T Great. A skin graft procedure using a oe bioengineered skin substitute/cellular or tissue based product was performed by Fredirick Maudlin, MD with the following instrument(s): Forceps and Scissors. Grafix prime was applied and secured with Steri-Strips. 2 sq cm of product was utilized and 0 sq cm was wasted. Post Application, Adaptic;gauze was applied. A Time Out was conducted at 13:00, prior to the start of the procedure. The procedure was tolerated well with a pain level of 0 throughout and a pain level of  0 following the procedure. Post procedure Diagnosis Wound #1: Same as Pre-Procedure . Plan Follow-up Appointments: Return Appointment in 1 week. - Dr Celine Ahr Room 3 Thursday 07/28/22 at 12:30pm Other: - Grafix #1 (07/11/22) Cellular or Tissue Based Products: Wound #1 Left T Great: oe Cellular or Tissue Based Product applied to wound bed, secured with steri-strips, cover with Adaptic or Mepitel. (DO NOT REMOVE). - *** Do not remove dressing for a week**** Grafix (Disc) 07/18/22 #2 Bathing/ Shower/ Hygiene: May shower with protection but do not get wound dressing(s) wet. - Do Not Left Great T wet**** Skin Substitute (Grafix) applied 07/18/22**** oe Off-Loading: Other: - No direct pressure on toes WOUND #1: - T Great Wound Laterality: Left oe Cleanser: Wound Cleanser 1 x Per Week/30 Days Discharge Instructions: Cleanse the wound with wound cleanser prior to applying a clean dressing using gauze sponges, not tissue or cotton balls. Prim Dressing: Optifoam Non-Adhesive Dressing, 4x4 in 1 x Per Week/30 Days ary Discharge Instructions: Apply to Left Great toe to protect toe Prim Dressing: GRAFIX 1 x Per Week/30 Days ary Discharge Instructions: Skin Sub Secondary Dressing: Woven Gauze Sponges 2x2 in 1 x Per Week/30 Days Discharge Instructions: Apply over primary dressing as directed. Secured With: Child psychotherapist, Sterile 2x75 (in/in) 1 x Per Week/30 Days Discharge Instructions: Secure with stretch gauze as directed. Secured With: Paper T ape, 1x10 (in/yd) 1 x Per Week/30 Days Discharge Instructions: Secure dressing with tape as directed. 07/18/2022: The wound has contracted somewhat and the surface has a healthier pink look to it. I used a curette to debride a little bit of eschar off of the wound surface. I then applied Grafix application #2 to the wound. The product was moistened with saline and secured in place with Adaptic and Steri-Strips. Follow-up in 1 week. Electronic  Signature(s) Signed: 07/19/2022 8:54:20 AM By: Fredirick Maudlin MD FACS Previous Signature: 07/18/2022 1:20:25 PM Version By: Fredirick Maudlin MD FACS Entered By: Fredirick Maudlin on 07/19/2022 08:54:20 -------------------------------------------------------------------------------- HxROS Details Patient Name: Date of Service: [name], [name] 07/18/2022 12:30 PM Medical Record Number: [id number] Patient Account Number: [id number] Date of Birth/Sex: Treating RN: 1931/10/09 (88 y.o. America Abalos Primary Care Provider: SYSTEM, PRO Ave Filter Other Clinician: Referring Provider: Treating Provider/Extender: Jadene Pierini  in Treatment: 23 Cardiovascular Medical History: Positive for: Arrhythmia - AV block, complete heart block; Congestive Heart Failure; Hypertension Past Medical History Notes: Pulmonary Hypertension Endocrine Medical History: Positive for: Type II Diabetes Past Medical History Notes: Hypoglycemia Treated with: Oral agents Blood sugar tested every day: Yes Tested : 2-3x a day Musculoskeletal Medical History: Positive for: Gout; Osteoarthritis Neurologic Medical History: Past Medical History Notes: Acute encephalopathy Immunizations Pneumococcal Vaccine: Received Pneumococcal Vaccination: No Implantable Devices No devices added Family and Social History Never smoker; Marital Status - Widowed; Alcohol Use: Never; Drug Use: No History; Caffeine Use: Rarely; Financial Concerns: No; Food, Clothing or Shelter Needs: No; Support System Lacking: No; Transportation Concerns: No Psychologist, prison and probation services) Signed: 07/18/2022 1:46:15 PM By: Duanne Guess MD FACS Signed: 07/18/2022 5:46:04 PM By: Karie Schwalbe RN Entered By: Duanne Guess on 07/18/2022 13:18:55 -------------------------------------------------------------------------------- SuperBill Details Patient Name: Date of Service: [name] 07/18/2022 Medical Record Number:  [id number] Patient Account Number: [id number] Date of Birth/Sex: Treating RN: 08/01/1931 (88 y.o. [name] Primary Care Provider: SYSTEM, PRO V IDER Other Clinician: Referring Provider: Treating Provider/Extender: [name] in Treatment: 23 Diagnosis Coding ICD-10 Codes Code Description E11.621 Type 2 diabetes mellitus with foot ulcer L97.522 Non-pressure chronic ulcer of other part of left foot with fat layer exposed I50.32 Chronic diastolic (congestive) heart failure I27.20 Pulmonary hypertension, unspecified Facility Procedures CPT4 Code: [id number] Description: 15275 - SKIN SUB GRAFT FACE/NK/HF/G ICD-10 Diagnosis Description L97.522 Non-pressure chronic ulcer of other part of left foot with fat layer exposed Modifier: Quantity: 1 Physician Procedures : CPT4 Code Description Modifier [id number] - WC PHYS LEVEL 3 - EST PT 25 ICD-10 Diagnosis Description L97.522 Non-pressure chronic ulcer of other part of left foot with fat layer exposed E11.621 Type 2 diabetes mellitus with foot ulcer I50.32 Chronic  diastolic (congestive) heart failure I27.20 Pulmonary hypertension, unspecified Quantity: 1 : [id number] 15275 - WC PHYS SKIN SUB GRAFT FACE/NK/HF/G ICD-10 Diagnosis Description L97.522 Non-pressure chronic ulcer of other part of left foot with fat layer exposed Quantity: 1 Electronic Signature(s) Signed: 07/18/2022 1:20:59 PM By: Duanne Guess MD FACS Entered By: Duanne Guess on 07/18/2022 13:20:59

## 2022-07-28 ENCOUNTER — Encounter (HOSPITAL_BASED_OUTPATIENT_CLINIC_OR_DEPARTMENT_OTHER): Payer: Medicare Other | Attending: General Surgery | Admitting: General Surgery

## 2022-07-28 DIAGNOSIS — I5032 Chronic diastolic (congestive) heart failure: Secondary | ICD-10-CM | POA: Diagnosis not present

## 2022-07-28 DIAGNOSIS — I110 Hypertensive heart disease with heart failure: Secondary | ICD-10-CM | POA: Insufficient documentation

## 2022-07-28 DIAGNOSIS — I272 Pulmonary hypertension, unspecified: Secondary | ICD-10-CM | POA: Diagnosis not present

## 2022-07-28 DIAGNOSIS — L97522 Non-pressure chronic ulcer of other part of left foot with fat layer exposed: Secondary | ICD-10-CM | POA: Insufficient documentation

## 2022-07-28 DIAGNOSIS — I442 Atrioventricular block, complete: Secondary | ICD-10-CM | POA: Insufficient documentation

## 2022-07-28 DIAGNOSIS — E1151 Type 2 diabetes mellitus with diabetic peripheral angiopathy without gangrene: Secondary | ICD-10-CM | POA: Insufficient documentation

## 2022-07-28 DIAGNOSIS — M109 Gout, unspecified: Secondary | ICD-10-CM | POA: Diagnosis not present

## 2022-07-28 DIAGNOSIS — M1990 Unspecified osteoarthritis, unspecified site: Secondary | ICD-10-CM | POA: Insufficient documentation

## 2022-07-28 DIAGNOSIS — E11621 Type 2 diabetes mellitus with foot ulcer: Secondary | ICD-10-CM | POA: Diagnosis present

## 2022-07-28 NOTE — Progress Notes (Addendum)
[name], [name] ([id number]) Visit Report for 07/28/2022 Chief Complaint Document Details Patient Name: Date of Service: [name], [name] 07/28/2022 12:30 PM Medical Record Number: [id number] Patient Account Number: [id number] Date of Birth/Sex: Treating RN: 10-03-1931 (88 y.o. F) Primary Care Provider: SYSTEM, PRO V IDER Other Clinician: Referring Provider: Treating Provider/Extender: [name] in Treatment: 25 Information Obtained from: Patient Chief Complaint Patient presents to the wound care center with open non-healing surgical wound(s) Electronic Signature(s) Signed: 07/28/2022 1:20:04 PM By: Duanne Guess MD FACS Entered By: Duanne Guess on 07/28/2022 13:20:04 -------------------------------------------------------------------------------- Cellular or Tissue Based Product Details Patient Name: Date of Service: [name]. 07/28/2022 12:30 PM Medical Record Number: [id number] Patient Account Number: [id number] Date of Birth/Sex: Treating RN: 1931-01-30 (88 y.o. [name] Primary Care Provider: SYSTEM, PRO Rodena Goldmann Other Clinician: Referring Provider: Treating Provider/Extender: [name] in Treatment: 25 Cellular or Tissue Based Product Type Wound #1 Left T Great oe Applied to: Performed By: Physician Duanne Guess, MD Cellular or Tissue Based Product Type: Grafix prime Level of Consciousness (Pre-procedure): Awake and Alert Pre-procedure Verification/Time Out Yes - 13:00 Taken: Location: genitalia / hands / feet / multiple digits Wound Size (sq cm): 0.04 Product Size (sq cm): 2 Waste Size (sq cm): 0 Amount of Product Applied (sq cm): 2 Instrument Used: Forceps, Scissors Lot #: [id number] Order #: 3 Expiration Date: 11/24/2023 Fenestrated: No Reconstituted: Yes Solution Type: NS Solution Amount: NS Lot #: [id number] Solution Expiration Date: 08/21/2022 Secured: Yes Secured With: Steri-Strips Dressing Applied:  Yes Primary Dressing: Adaptic Procedural Pain: 0 Post Procedural Pain: 0 Response to Treatment: Procedure was tolerated well Level of Consciousness (Post- Awake and Alert procedure): Post Procedure Diagnosis Same as Pre-procedure Electronic Signature(s) Signed: 07/28/2022 5:15:20 PM By: Karie Schwalbe RN Signed: 07/29/2022 7:36:59 AM By: Duanne Guess MD FACS Previous Signature: 07/28/2022 1:35:24 PM Version By: Karie Schwalbe RN Previous Signature: 07/28/2022 1:48:33 PM Version By: Duanne Guess MD FACS Entered By: Karie Schwalbe on 07/28/2022 17:12:44 -------------------------------------------------------------------------------- Debridement Details Patient Name: Date of Service: [name]. 07/28/2022 12:30 PM Medical Record Number: [id number] Patient Account Number: [id number] Date of Birth/Sex: Treating RN: 15-Jan-1931 (88 y.o. [name] Primary Care Provider: SYSTEM, PRO V IDER Other Clinician: Referring Provider: Treating Provider/Extender: [name] in Treatment: 25 Debridement Performed for Assessment: Wound #1 Left T Great oe Performed By: Physician Duanne Guess, MD Debridement Type: Debridement Severity of Tissue Pre Debridement: Fat layer exposed Level of Consciousness (Pre-procedure): Awake and Alert Pre-procedure Verification/Time Out Yes - 13:00 Taken: Start Time: 13:00 Pain Control: Lidocaine 5% topical ointment T Area Debrided (L x W): otal 0.2 (cm) x 0.2 (cm) = 0.04 (cm) Tissue and other material debrided: Non-Viable, Callus, Slough, Slough Level: Non-Viable Tissue Debridement Description: Selective/Open Wound Instrument: Curette, Forceps, Scissors Bleeding: Minimum Hemostasis Achieved: Pressure End Time: 13:02 Procedural Pain: 0 Post Procedural Pain: 0 Response to Treatment: Procedure was tolerated well Level of Consciousness (Post- Awake and Alert procedure): Post Debridement Measurements of Total Wound Length:  (cm) 0.2 Width: (cm) 0.2 Depth: (cm) 0.1 Volume: (cm) 0.003 Character of Wound/Ulcer Post Debridement: Improved Severity of Tissue Post Debridement: Fat layer exposed Post Procedure Diagnosis Same as Pre-procedure Electronic Signature(s) Signed: 07/28/2022 1:35:24 PM By: Karie Schwalbe RN Signed: 07/28/2022 1:48:33 PM By: Duanne Guess MD FACS Entered By: Karie Schwalbe on 07/28/2022 13:33:49 -------------------------------------------------------------------------------- HPI Details Patient Name: Date of Service: [name]. 07/28/2022 12:30 PM Medical Record Number: [id number] Patient Account Number: [id number] Date  of Birth/Sex: Treating RN: [birth year] (88 y.o. F) Primary Care Provider: SYSTEM, PRO V IDER Other Clinician: Referring Provider: Treating Provider/Extender: [name] in Treatment: 25 History of Present Illness HPI Description: ADMISSION 02/01/2022 This is an 88 year old woman who is a resident of [facility]. She has a past medical history notable for type 2 diabetes mellitus, congestive heart failure, pulmonary hypertension, and complete heart block. She is accompanied today by her son. Approximately 4 months ago, she had an ingrown toe nail removed. Apparently this went a bit deep. Since that time, it has not healed. The patient is nonambulatory. Apparently they have been applying Santyl to the site at her facility. She was on antibiotics but I am not certain which specific drugs she took. She completed her last course a few weeks ago. We were unable to obtain ABIs in clinic secondary to the patient's body habitus. She has not had any formal vascular studies nor any x-ray of the area. 02/09/2022: The wound is roughly the same size today, but the amount of slough in the base has decreased significantly. She apparently had an x-ray done at her facility, but we do not have the report; per verbal report from facility staff, "there was nothing there." We  had requested formal ABI/TBI studies, but these were not done. She has been in [place] under [employer] [facility]	[place]. 02/16/2022: The wound measured about the same today, but to my eye, it appears rather smaller. The slough in the base is minimal. Her vascular studies are scheduled for Friday. We have been using Santyl under Hydrofera Blue. No concern for infection. 02/23/2022: Once again, the wound has been measured about the same size, but I think it looks a bit smaller. There is slough at the base. She had her ABIs done last week and these show severe bilateral peripheral arterial disease. We received the report of the foot x-ray that was ordered. There is no concern for osteomyelitis at this time. 03/02/2022: The wound is smaller today with a small amount of slough at the base. We placed a referral to vein and vascular surgery last week in response to her severe peripheral arterial disease, but she does not yet have an appointment on the books. 03/16/2022: She saw Dr. Lenell Antu in the vascular surgery department yesterday. He did not think she was a good candidate for any revascularization options and suggested that she continue local wound care for another month. If she fails to heal in that time, they may consider an angiogram with potential intervention. The wound today is roughly the same size but there is less exposed bone at the base and the surface is clean without any substantial slough. 03/24/2022: The wound appears about the same. There is some periwound callus that has heaped up. We changed her to Prisma silver collagen last week. 04/06/2022: No significant change to the wound. It is neither improving nor deteriorating. She continues to accumulate some periwound callus around the margin. Bone is exposed at the base. 04/20/2022: The wound has improved quite a bit over the past 2 weeks. It is smaller and there is no real accumulation of periwound callus. She had follow-up with Dr. Lenell Antu yesterday and he felt  that the wound was progressing well enough that no arterial intervention was necessary. 05/18/2022: The patient has been absent due to a fall from her wheelchair during her last transportation that was supposed to be to her clinic. Today, the wound is about the same size. There is still bone exposed at the  base. There is periwound callus accumulation. No concern for infection. 06/01/2022: The wound is actually a little bit smaller today and the surface is not as pale. She still has accumulated periwound callus. 06/15/2022: The wound continues to contract, albeit slowly. No concern for infection. 06/28/2022: The wound is slightly smaller again today. She continues to accumulate callus and there is some periwound epibole at the margins. No concern for gross infection. 07/11/2022: The wound is roughly the same size with just a small opening in the center. She has been approved for Grafix. 07/18/2022: The wound has contracted somewhat and the surface has a healthier pink look to it. We applied Grafix last week and she is here for application #2. 07/28/2022: The wound is about the same size but the surface continues to improve and appear more vital. She is here for Grafix application #3. Electronic Signature(s) Signed: 07/28/2022 1:20:33 PM By: Duanne Guess MD FACS Entered By: Duanne Guess on 07/28/2022 13:20:33 -------------------------------------------------------------------------------- Physical Exam Details Patient Name: Date of Service: [name]. 07/28/2022 12:30 PM Medical Record Number: [id number] Patient Account Number: [id number] Date of Birth/Sex: Treating RN: 05/11/1931 (88 y.o. F) Primary Care Provider: SYSTEM, PRO V IDER Other Clinician: Referring Provider: Treating Provider/Extender: [name] in Treatment: 25 Constitutional . . . . No acute distress.[name] Respiratory Normal work of breathing on room air.. Notes 07/28/2022: The wound is about the same size but the  surface continues to improve and appear more vital. Electronic Signature(s) Signed: 07/28/2022 1:20:59 PM By: Duanne Guess MD FACS Entered By: Duanne Guess on 07/28/2022 13:20:58 -------------------------------------------------------------------------------- Physician Orders Details Patient Name: Date of Service: [name]. 07/28/2022 12:30 PM Medical Record Number: [id number] Patient Account Number: [id number] Date of Birth/Sex: Treating RN: [birth year] (88 y.o. [name] Primary Care Provider: SYSTEM, PRO V IDER Other Clinician: Referring Provider: Treating Provider/Extender: [name] in Treatment: 25 Verbal / Phone Orders: No Diagnosis Coding ICD-10 Coding Code Description E11.621 Type 2 diabetes mellitus with foot ulcer L97.522 Non-pressure chronic ulcer of other part of left foot with fat layer exposed I50.32 Chronic diastolic (congestive) heart failure I27.20 Pulmonary hypertension, unspecified Follow-up Appointments ppointment in 1 week. - Dr Lady Gary Room 3 Thursday 08/04/22 at 1:15pm Return A Other: - Grafix #3 (07/28/22) Cellular or Tissue Based Products Wound #1 Left T Great oe daptic or Mepitel. (DO NOT REMOVE). - *** Cellular or Tissue Based Product applied to wound bed, secured with steri-strips, cover with A Do not remove dressing for a week**** Grafix PL PRIME 89mm (Disc) Bathing/ Shower/ Hygiene May shower with protection but do not get wound dressing(s) wet. - Do Not Left Great T wet**** Skin Substitute (Grafix #3) applied 07/28/22**** oe Off-Loading Other: - No direct pressure on toes Wound Treatment Wound #1 - T Great oe Wound Laterality: Left Cleanser: Wound Cleanser 1 x Per Week/30 Days Discharge Instructions: Cleanse the wound with wound cleanser prior to applying a clean dressing using gauze sponges, not tissue or cotton balls. Prim Dressing: Optifoam Non-Adhesive Dressing, 4x4 in 1 x Per Week/30 Days ary Discharge  Instructions: Apply to Left Great toe to protect toe Prim Dressing: GRAFIX 1 x Per Week/30 Days ary Discharge Instructions: Skin Sub Secondary Dressing: Woven Gauze Sponges 2x2 in 1 x Per Week/30 Days Discharge Instructions: Apply over primary dressing as directed. Secured With: Insurance underwriter, Sterile 2x75 (in/in) 1 x Per Week/30 Days Discharge Instructions: Secure with stretch gauze as directed. Secured With: Paper Tape, 1x10 (in/yd) 1 x  Per Week/30 Days Discharge Instructions: Secure dressing with tape as directed. Electronic Signature(s) Signed: 07/28/2022 1:35:24 PM By: Karie Schwalbe RN Signed: 07/28/2022 1:48:33 PM By: Duanne Guess MD FACS Entered By: Karie Schwalbe on 07/28/2022 13:31:46 -------------------------------------------------------------------------------- Problem List Details Patient Name: Date of Service: [name], [name] Portela. 07/28/2022 12:30 PM Medical Record Number: [id number] Patient Account Number: [id number] Date of Birth/Sex: Treating RN: 08/21/1931 (88 y.o. F) Primary Care Provider: SYSTEM, PRO V IDER Other Clinician: Referring Provider: Treating Provider/Extender: [name] in Treatment: 25 Active Problems ICD-10 Encounter Code Description Active Date MDM Diagnosis E11.621 Type 2 diabetes mellitus with foot ulcer 02/01/2022 No Yes L97.522 Non-pressure chronic ulcer of other part of left foot with fat layer exposed 02/01/2022 No Yes I50.32 Chronic diastolic (congestive) heart failure 02/01/2022 No Yes I27.20 Pulmonary hypertension, unspecified 02/01/2022 No Yes Inactive Problems Resolved Problems Electronic Signature(s) Signed: 07/28/2022 1:19:08 PM By: Duanne Guess MD FACS Entered By: Duanne Guess on 07/28/2022 13:19:08 -------------------------------------------------------------------------------- Progress Note Details Patient Name: Date of Service: [name]. 07/28/2022 12:30 PM Medical Record Number:  [id number] Patient Account Number: [id number] Date of Birth/Sex: Treating RN: September 07, 1931 (88 y.o. F) Primary Care Provider: SYSTEM, PRO V IDER Other Clinician: Referring Provider: Treating Provider/Extender: [name] in Treatment: 25 Subjective Chief Complaint Information obtained from Patient Patient presents to the wound care center with open non-healing surgical wound(s) History of Present Illness (HPI) ADMISSION 02/01/2022 This is an 88 year old woman who is a resident of [facility]. She has a past medical history notable for type 2 diabetes mellitus, congestive heart failure, pulmonary hypertension, and complete heart block. She is accompanied today by her son. Approximately 4 months ago, she had an ingrown toe nail removed. Apparently this went a bit deep. Since that time, it has not healed. The patient is nonambulatory. Apparently they have been applying Santyl to the site at her facility. She was on antibiotics but I am not certain which specific drugs she took. She completed her last course a few weeks ago. We were unable to obtain ABIs in clinic secondary to the patient's body habitus. She has not had any formal vascular studies nor any x-ray of the area. 02/09/2022: The wound is roughly the same size today, but the amount of slough in the base has decreased significantly. She apparently had an x-ray done at her facility, but we do not have the report; per verbal report from facility staff, "there was nothing there." We had requested formal ABI/TBI studies, but these were not done. She has been in [place] under [employer] [facility]	[place]. 02/16/2022: The wound measured about the same today, but to my eye, it appears rather smaller. The slough in the base is minimal. Her vascular studies are scheduled for Friday. We have been using Santyl under Hydrofera Blue. No concern for infection. 02/23/2022: Once again, the wound has been measured about the same size, but I think it looks a bit  smaller. There is slough at the base. She had her ABIs done last week and these show severe bilateral peripheral arterial disease. We received the report of the foot x-ray that was ordered. There is no concern for osteomyelitis at this time. 03/02/2022: The wound is smaller today with a small amount of slough at the base. We placed a referral to vein and vascular surgery last week in response to her severe peripheral arterial disease, but she does not yet have an appointment on the books. 03/16/2022: She saw Dr. Lenell Antu in the vascular surgery department yesterday.  He did not think she was a good candidate for any revascularization options and suggested that she continue local wound care for another month. If she fails to heal in that time, they may consider an angiogram with potential intervention. The wound today is roughly the same size but there is less exposed bone at the base and the surface is clean without any substantial slough. 03/24/2022: The wound appears about the same. There is some periwound callus that has heaped up. We changed her to Prisma silver collagen last week. 04/06/2022: No significant change to the wound. It is neither improving nor deteriorating. She continues to accumulate some periwound callus around the margin. Bone is exposed at the base. 04/20/2022: The wound has improved quite a bit over the past 2 weeks. It is smaller and there is no real accumulation of periwound callus. She had follow-up with Dr. Lenell Antu yesterday and he felt that the wound was progressing well enough that no arterial intervention was necessary. 05/18/2022: The patient has been absent due to a fall from her wheelchair during her last transportation that was supposed to be to her clinic. Today, the wound is about the same size. There is still bone exposed at the base. There is periwound callus accumulation. No concern for infection. 06/01/2022: The wound is actually a little bit smaller today and the surface  is not as pale. She still has accumulated periwound callus. 06/15/2022: The wound continues to contract, albeit slowly. No concern for infection. 06/28/2022: The wound is slightly smaller again today. She continues to accumulate callus and there is some periwound epibole at the margins. No concern for gross infection. 07/11/2022: The wound is roughly the same size with just a small opening in the center. She has been approved for Grafix. 07/18/2022: The wound has contracted somewhat and the surface has a healthier pink look to it. We applied Grafix last week and she is here for application #2. 07/28/2022: The wound is about the same size but the surface continues to improve and appear more vital. She is here for Grafix application #3. Patient History Social History Never smoker, Marital Status - Widowed, Alcohol Use - Never, Drug Use - No History, Caffeine Use - Rarely. Medical History Cardiovascular Patient has history of Arrhythmia - AV block, complete heart block, Congestive Heart Failure, Hypertension Endocrine Patient has history of Type II Diabetes Musculoskeletal Patient has history of Gout, Osteoarthritis Medical A Surgical History Notes nd Cardiovascular Pulmonary Hypertension Endocrine Hypoglycemia Neurologic Acute encephalopathy Objective Constitutional No acute distress.. Vitals Time Taken: 12:44 PM, Temperature: 97.9 F, Pulse: 90 bpm, Respiratory Rate: 18 breaths/min, Blood Pressure: 138/83 mmHg. Respiratory Normal work of breathing on room air.. General Notes: 07/28/2022: The wound is about the same size but the surface continues to improve and appear more vital. Integumentary (Hair, Skin) Wound #1 status is Open. Original cause of wound was Gradually Appeared. The date acquired was: 09/29/2021. The wound has been in treatment 25 weeks. The wound is located on the Left T Great. The wound measures 0.2cm length x 0.2cm width x 0.1cm depth; 0.031cm^2 area and 0.003cm^3 volume.  There is Fat oe Layer (Subcutaneous Tissue) exposed. There is no tunneling noted. There is a medium amount of serous drainage noted. The wound margin is flat and intact. There is small (1-33%) pink granulation within the wound bed. There is a large (67-100%) amount of necrotic tissue within the wound bed including Adherent Slough. Assessment Active Problems ICD-10 Type 2 diabetes mellitus with foot ulcer Non-pressure chronic  ulcer of other part of left foot with fat layer exposed Chronic diastolic (congestive) heart failure Pulmonary hypertension, unspecified Procedures Wound #1 Pre-procedure diagnosis of Wound #1 is a Diabetic Wound/Ulcer of the Lower Extremity located on the Left T Great .Severity of Tissue Pre Debridement is: oe Fat layer exposed. There was a Selective/Open Wound Non-Viable Tissue Debridement with a total area of 0.04 sq cm performed by Duanne Guess, MD. With the following instrument(s): Curette, Forceps, and Scissors to remove Non-Viable tissue/material. Material removed includes Callus and Slough and after achieving pain control using Lidocaine 5% topical ointment. No specimens were taken. A time out was conducted at 13:00, prior to the start of the procedure. A Minimum amount of bleeding was controlled with Pressure. The procedure was tolerated well with a pain level of 0 throughout and a pain level of 0 following the procedure. Post Debridement Measurements: 0.2cm length x 0.2cm width x 0.1cm depth; 0.003cm^3 volume. Character of Wound/Ulcer Post Debridement is improved. Severity of Tissue Post Debridement is: Fat layer exposed. Post procedure Diagnosis Wound #1: Same as Pre-Procedure Pre-procedure diagnosis of Wound #1 is a Diabetic Wound/Ulcer of the Lower Extremity located on the Left T Great. A skin graft procedure using a oe bioengineered skin substitute/cellular or tissue based product was performed by Duanne Guess, MD with the following instrument(s):  Forceps and Scissors. Grafix prime was applied and secured with Steri-Strips. 2 sq cm of product was utilized and 0 sq cm was wasted. Post Application, Adaptic was applied. A Time Out was conducted at 13:00, prior to the start of the procedure. The procedure was tolerated well with a pain level of 0 throughout and a pain level of 0 following the procedure. Post procedure Diagnosis Wound #1: Same as Pre-Procedure . Plan Follow-up Appointments: Return Appointment in 1 week. - Dr Lady Gary Room 3 Thursday 08/04/22 at 1:15pm Other: - Grafix #3 (07/28/22) Cellular or Tissue Based Products: Wound #1 Left T Great: oe Cellular or Tissue Based Product applied to wound bed, secured with steri-strips, cover with Adaptic or Mepitel. (DO NOT REMOVE). - *** Do not remove dressing for a week**** Grafix PL PRIME 47mm (Disc) Bathing/ Shower/ Hygiene: May shower with protection but do not get wound dressing(s) wet. - Do Not Left Great T wet**** Skin Substitute (Grafix #3) applied 07/28/22**** oe Off-Loading: Other: - No direct pressure on toes WOUND #1: - T Great Wound Laterality: Left oe Cleanser: Wound Cleanser 1 x Per Week/30 Days Discharge Instructions: Cleanse the wound with wound cleanser prior to applying a clean dressing using gauze sponges, not tissue or cotton balls. Prim Dressing: Optifoam Non-Adhesive Dressing, 4x4 in 1 x Per Week/30 Days ary Discharge Instructions: Apply to Left Great toe to protect toe Prim Dressing: GRAFIX 1 x Per Week/30 Days ary Discharge Instructions: Skin Sub Secondary Dressing: Woven Gauze Sponges 2x2 in 1 x Per Week/30 Days Discharge Instructions: Apply over primary dressing as directed. Secured With: Insurance underwriter, Sterile 2x75 (in/in) 1 x Per Week/30 Days Discharge Instructions: Secure with stretch gauze as directed. Secured With: Paper Tape, 1x10 (in/yd) 1 x Per Week/30 Days Discharge Instructions: Secure dressing with tape as directed. 07/28/2022:  The wound is about the same size but the surface continues to improve and appear more vital. I used a curette to debride some slough and periwound callus from the site. I then applied the Grafix skin substitute in standard fashion, moistening it with saline. It was secured in place with Adaptic and Steri-Strips. She will follow-up  in 1 week. Electronic Signature(s) Signed: 08/01/2022 8:15:38 AM By: Duanne Guess MD FACS Previous Signature: 07/28/2022 1:21:46 PM Version By: Duanne Guess MD FACS Entered By: Duanne Guess on 08/01/2022 08:15:38 -------------------------------------------------------------------------------- HxROS Details Patient Name: Date of Service: [name], [name] Portela. 07/28/2022 12:30 PM Medical Record Number: [id number] Patient Account Number: [id number] Date of Birth/Sex: Treating RN: January 09, 1931 (88 y.o. F) Primary Care Provider: SYSTEM, PRO V IDER Other Clinician: Referring Provider: Treating Provider/Extender: [name] in Treatment: 25 Cardiovascular Medical History: Positive for: Arrhythmia - AV block, complete heart block; Congestive Heart Failure; Hypertension Past Medical History Notes: Pulmonary Hypertension Endocrine Medical History: Positive for: Type II Diabetes Past Medical History Notes: Hypoglycemia Treated with: Oral agents Blood sugar tested every day: Yes Tested : 2-3x a day Musculoskeletal Medical History: Positive for: Gout; Osteoarthritis Neurologic Medical History: Past Medical History Notes: Acute encephalopathy Immunizations Pneumococcal Vaccine: Received Pneumococcal Vaccination: No Implantable Devices No devices added Family and Social History Never smoker; Marital Status - Widowed; Alcohol Use: Never; Drug Use: No History; Caffeine Use: Rarely; Financial Concerns: No; Food, Clothing or Shelter Needs: No; Support System Lacking: No; Transportation Concerns: No Electronic Signature(s) Signed: 07/28/2022 1:48:33 PM  By: Duanne Guess MD FACS Entered By: Duanne Guess on 07/28/2022 13:20:38 -------------------------------------------------------------------------------- SuperBill Details Patient Name: Date of Service: [name] 07/28/2022 Medical Record Number: [id number] Patient Account Number: [id number] Date of Birth/Sex: Treating RN: 1931-05-12 (88 y.o. F) Primary Care Provider: SYSTEM, PRO V IDER Other Clinician: Referring Provider: Treating Provider/Extender: [name] in Treatment: 25 Diagnosis Coding ICD-10 Codes Code Description E11.621 Type 2 diabetes mellitus with foot ulcer L97.522 Non-pressure chronic ulcer of other part of left foot with fat layer exposed I50.32 Chronic diastolic (congestive) heart failure I27.20 Pulmonary hypertension, unspecified Facility Procedures CPT4 Code: 67124580 Description: 15275 - SKIN SUB GRAFT FACE/NK/HF/G ICD-10 Diagnosis Description L97.522 Non-pressure chronic ulcer of other part of left foot with fat layer exposed Modifier: Quantity: 1 CPT4 Code: 99833825 Description: Q4133- Grafix PL 16 mm disc Modifier: Quantity: 2 Physician Procedures : CPT4 Code Description Modifier [id number] - WC PHYS LEVEL 3 - EST PT 25 ICD-10 Diagnosis Description L97.522 Non-pressure chronic ulcer of other part of left foot with fat layer exposed E11.621 Type 2 diabetes mellitus with foot ulcer I50.32 Chronic  diastolic (congestive) heart failure I27.20 Pulmonary hypertension, unspecified Quantity: 1 : [id number] 15275 - WC PHYS SKIN SUB GRAFT FACE/NK/HF/G ICD-10 Diagnosis Description L97.522 Non-pressure chronic ulcer of other part of left foot with fat layer exposed Quantity: 1 Electronic Signature(s) Signed: 07/28/2022 5:15:20 PM By: Karie Schwalbe RN Signed: 07/29/2022 7:36:59 AM By: Duanne Guess MD FACS Previous Signature: 07/28/2022 1:22:23 PM Version By: Duanne Guess MD FACS Entered By: Karie Schwalbe on 07/28/2022 17:14:11

## 2022-07-28 NOTE — Progress Notes (Signed)
[name], [name] ([id number]) Visit Report for 07/28/2022 Arrival Information Details Patient Name: Date of Service: [name] 07/28/2022 12:30 PM Medical Record Number: [id number] Patient Account Number: [id number] Date of Birth/Sex: Treating RN: [birth year] (88 y.o. America Yanko Primary Care Loc Feinstein: SYSTEM, PRO V IDER Other Clinician: Referring Nevaeh Korte: Treating Collin Rengel/Extender: Jadene Pierini in Treatment: 25 Visit Information History Since Last Visit Added or deleted any medications: No Patient Arrived: Wheel Chair Any new allergies or adverse reactions: No Arrival Time: 12:44 Had a fall or experienced change in No Accompanied By: son activities of daily living that may affect Transfer Assistance: None risk of falls: Patient Requires Transmission-Based Precautions: No Signs or symptoms of abuse/neglect since last visito No Patient Has Alerts: Yes Hospitalized since last visit: No Patient Alerts: ABI not obtainable Implantable device outside of the clinic excluding No cellular tissue based products placed in the center since last visit: Has Dressing in Place as Prescribed: Yes Pain Present Now: No Electronic Signature(s) Signed: 07/28/2022 1:35:24 PM By: Dellie Catholic RN Entered By: Dellie Catholic on 07/28/2022 12:44:55 -------------------------------------------------------------------------------- Encounter Discharge Information Details Patient Name: Date of Service: [name], [name] Farrier S. 07/28/2022 12:30 PM Medical Record Number: [id number] Patient Account Number: [id number] Date of Birth/Sex: Treating RN: [birth year] (88 y.o. America Saling Primary Care Maleigh Bagot: SYSTEM, PRO Ave Filter Other Clinician: Referring Chayna Surratt: Treating Alexius Ellington/Extender: Jadene Pierini in Treatment: 25 Encounter Discharge Information Items Post Procedure Vitals Discharge Condition: Stable Temperature (F): 97.9 Ambulatory Status: Wheelchair Pulse (bpm):  90 Discharge Destination: [place] Respiratory Rate (breaths/min): 18 Telephoned: No Blood Pressure (mmHg): 138/83 Orders Sent: Yes Transportation: Other Accompanied By: son Schedule Follow-up Appointment: Yes Clinical Summary of Care: Patient Declined Notes copy of orders given to patient for [facility] [employer]) Signed: 07/28/2022 1:35:24 PM By: Dellie Catholic RN Entered By: Dellie Catholic on 07/28/2022 13:35:01 -------------------------------------------------------------------------------- Lower Extremity Assessment Details Patient Name: Date of Service: [name] 07/28/2022 12:30 PM Medical Record Number: [id number] Patient Account Number: [id number] Date of Birth/Sex: Treating RN: [birth year] (88 y.o. America Accomando Primary Care Findley Vi: SYSTEM, PRO Ave Filter Other Clinician: Referring Eilyn Polack: Treating Annie Roseboom/Extender: Jadene Pierini in Treatment: 25 Edema Assessment Assessed: [Left: No] [Right: No] Edema: [Left: Ye] [Right: s] Calf Left: Right: Point of Measurement: 26 cm From Medial Instep 39 cm Ankle Left: Right: Point of Measurement: 10 cm From Medial Instep 25 cm Electronic Signature(s) Signed: 07/28/2022 1:35:24 PM By: Dellie Catholic RN Entered By: Dellie Catholic on 07/28/2022 12:54:02 -------------------------------------------------------------------------------- Multi Wound Chart Details Patient Name: Date of Service: [name]. 07/28/2022 12:30 PM Medical Record Number: [id number] Patient Account Number: [id number] Date of Birth/Sex: Treating RN: 01-29-1931 (88 y.o. F) Primary Care [name]: SYSTEM, PRO V IDER Other Clinician: Referring Cathaleen Korol: Treating Maheen Cwikla/Extender: Jadene Pierini in Treatment: 25 Vital Signs Height(in): Pulse(bpm): 90 Weight(lbs): Blood Pressure(mmHg): 138/83 Body Mass Index(BMI): Temperature(F): 97.9 Respiratory Rate(breaths/min): 18 Photos:  [N/A:N/A] Left T Great oe N/A N/A Wound Location: Gradually Appeared N/A N/A Wounding Event: Diabetic Wound/Ulcer of the Lower N/A N/A Primary Etiology: Extremity Arrhythmia, Congestive Heart Failure, N/A N/A Comorbid History: Hypertension, Type II Diabetes, Gout, Osteoarthritis 09/29/2021 N/A N/A Date Acquired: 25 N/A N/A Weeks of Treatment: Open N/A N/A Wound Status: No N/A N/A Wound Recurrence: 0.2x0.2x0.1 N/A N/A Measurements L x W x D (cm) 0.031 N/A N/A A (cm) : rea 0.003 N/A N/A Volume (cm) : 89.00% N/A N/A % Reduction in A rea: 94.70% N/A N/A % Reduction in  Volume: Grade 2 N/A N/A Classification: Medium N/A N/A Exudate A mount: Serous N/A N/A Exudate Type: amber N/A N/A Exudate Color: Flat and Intact N/A N/A Wound Margin: Small (1-33%) N/A N/A Granulation A mount: Pink N/A N/A Granulation Quality: Large (67-100%) N/A N/A Necrotic A mount: Fat Layer (Subcutaneous Tissue): Yes N/A N/A Exposed Structures: Fascia: No Tendon: No Muscle: No Joint: No Bone: No Small (1-33%) N/A N/A Epithelialization: Debridement - Selective/Open Wound N/A N/A Debridement: Pre-procedure Verification/Time Out 13:00 N/A N/A Taken: Lidocaine 5% topical ointment N/A N/A Pain Control: Callus, Slough N/A N/A Tissue Debrided: Non-Viable Tissue N/A N/A Level: 0.04 N/A N/A Debridement A (sq cm): rea Curette, Forceps, Scissors N/A N/A Instrument: Minimum N/A N/A Bleeding: Pressure N/A N/A Hemostasis A chieved: 0 N/A N/A Procedural Pain: 0 N/A N/A Post Procedural Pain: Procedure was tolerated well N/A N/A Debridement Treatment Response: 0.2x0.2x0.1 N/A N/A Post Debridement Measurements L x W x D (cm) 0.003 N/A N/A Post Debridement Volume: (cm) Debridement N/A N/A Procedures Performed: Treatment Notes Electronic Signature(s) Signed: 07/28/2022 1:19:58 PM By: Fredirick Maudlin MD FACS Entered By: Fredirick Maudlin on 07/28/2022  13:19:58 -------------------------------------------------------------------------------- Multi-Disciplinary Care Plan Details Patient Name: Date of Service: [name], [name]. 07/28/2022 12:30 PM Medical Record Number: [id number] Patient Account Number: [id number] Date of Birth/Sex: Treating RN: February 11, 1931 (88 y.o. America Bebeau Primary Care Karmen Altamirano: SYSTEM, PRO Ave Filter Other Clinician: Referring Yarnell Arvidson: Treating Holbert Caples/Extender: Jadene Pierini in Treatment: 25 Multidisciplinary Care Plan reviewed with physician Active Inactive Nutrition Nursing Diagnoses: Impaired glucose control: actual or potential Potential for alteratiion in Nutrition/Potential for imbalanced nutrition Goals: Patient/caregiver will maintain therapeutic glucose control Date Initiated: 02/01/2022 Target Resolution Date: 08/20/2022 Goal Status: Active Interventions: Assess HgA1c results as ordered upon admission and as needed Assess patient nutrition upon admission and as needed per policy Provide education on elevated blood sugars and impact on wound healing Provide education on nutrition Treatment Activities: Education provided on Nutrition : 02/01/2022 Notes: Wound/Skin Impairment Nursing Diagnoses: Impaired tissue integrity Knowledge deficit related to ulceration/compromised skin integrity Goals: Patient/caregiver will verbalize understanding of skin care regimen Date Initiated: 02/01/2022 Date Inactivated: 05/18/2022 Target Resolution Date: 04/28/2022 Goal Status: Met Ulcer/skin breakdown will have a volume reduction of 30% by week 4 Date Initiated: 02/01/2022 Date Inactivated: 03/24/2022 Target Resolution Date: 03/31/2022 Goal Status: Unmet Unmet Reason: PAD Ulcer/skin breakdown will have a volume reduction of 50% by week 8 Date Initiated: 04/06/2022 Target Resolution Date: 08/20/2022 Goal Status: Active Interventions: Assess patient/caregiver ability to obtain necessary  supplies Assess patient/caregiver ability to perform ulcer/skin care regimen upon admission and as needed Assess ulceration(s) every visit Provide education on ulcer and skin care Notes: Electronic Signature(s) Signed: 07/28/2022 1:35:24 PM By: Dellie Catholic RN Entered By: Dellie Catholic on 07/28/2022 13:31:58 -------------------------------------------------------------------------------- Pain Assessment Details Patient Name: Date of Service: [name]. 07/28/2022 12:30 PM Medical Record Number: [id number] Patient Account Number: [id number] Date of Birth/Sex: Treating RN: Mar 03, 1931 (88 y.o. America Tippett Primary Care Derricka Mertz: SYSTEM, PRO Ave Filter Other Clinician: Referring Jakita Dutkiewicz: Treating Rhodes Calvert/Extender: Jadene Pierini in Treatment: 25 Active Problems Location of Pain Severity and Description of Pain Patient Has Paino Yes Site Locations Pain Location: Pain Location: Generalized Pain With Dressing Change: Yes Duration of the Pain. Constant / Intermittento Constant Rate the pain. Current Pain Level: 7 Worst Pain Level: 10 Least Pain Level: 5 Tolerable Pain Level: 5 Character of Pain Describe the Pain: Difficult to Pinpoint Pain Management and Medication Current Pain Management: Medication: Yes Cold Application:  No Rest: Yes Massage: No Activity: No T.E.N.S.: No Heat Application: No Leg drop or elevation: No Is the Current Pain Management Adequate: Adequate How does your wound impact your activities of daily livingo Sleep: No Bathing: No Appetite: No Relationship With Others: No Bladder Continence: No Emotions: No Bowel Continence: No Work: No Toileting: No Drive: No Dressing: No Hobbies: No Electronic Signature(s) Signed: 07/28/2022 1:35:24 PM By: Dellie Catholic RN Entered By: Dellie Catholic on 07/28/2022 12:53:57 -------------------------------------------------------------------------------- Patient/Caregiver Education  Details Patient Name: Date of Service: [name] 9/7/2023andnbsp12:30 PM Medical Record Number: [id number] Patient Account Number: [id number] Date of Birth/Gender: Treating RN: 1931-06-28 (88 y.o. America Ephraim Primary Care Physician: SYSTEM, PRO V IDER Other Clinician: Referring Physician: Treating Physician/Extender: Jadene Pierini in Treatment: 25 Education Assessment Education Provided To: Patient Education Topics Provided Wound/Skin Impairment: Methods: Explain/Verbal Responses: Return demonstration correctly Electronic Signature(s) Signed: 07/28/2022 1:35:24 PM By: Dellie Catholic RN Entered By: Dellie Catholic on 07/28/2022 13:32:13 -------------------------------------------------------------------------------- Wound Assessment Details Patient Name: Date of Service: [name]. 07/28/2022 12:30 PM Medical Record Number: [id number] Patient Account Number: [id number] Date of Birth/Sex: Treating RN: Oct 08, 1931 (88 y.o. America Arney Primary Care Manuel Dall: SYSTEM, PRO V IDER Other Clinician: Referring Abbygail [place]hoite: Treating Stokely Jeancharles/Extender: Jadene Pierini in Treatment: 25 Wound Status Wound Number: 1 Primary Diabetic Wound/Ulcer of the Lower Extremity Etiology: Wound Location: Left T Great oe Wound Open Wounding Event: Gradually Appeared Status: Date Acquired: 09/29/2021 Comorbid Arrhythmia, Congestive Heart Failure, Hypertension, Type II Weeks Of Treatment: 25 History: Diabetes, Gout, Osteoarthritis Clustered Wound: No Photos Wound Measurements Length: (cm) 0.2 Width: (cm) 0.2 Depth: (cm) 0.1 Area: (cm) 0.031 Volume: (cm) 0.003 % Reduction in Area: 89% % Reduction in Volume: 94.7% Epithelialization: Small (1-33%) Tunneling: No Wound Description Classification: Grade 2 Wound Margin: Flat and Intact Exudate Amount: Medium Exudate Type: Serous Exudate Color: amber Foul Odor After Cleansing: No Slough/Fibrino  Yes Wound Bed Granulation Amount: Small (1-33%) Exposed Structure Granulation Quality: Pink Fascia Exposed: No Necrotic Amount: Large (67-100%) Fat Layer (Subcutaneous Tissue) Exposed: Yes Necrotic Quality: Adherent Slough Tendon Exposed: No Muscle Exposed: No Joint Exposed: No Bone Exposed: No Treatment Notes Wound #1 (Toe Great) Wound Laterality: Left Cleanser Wound Cleanser Discharge Instruction: Cleanse the wound with wound cleanser prior to applying a clean dressing using gauze sponges, not tissue or cotton balls. Peri-Wound Care Topical Primary Dressing Optifoam Non-Adhesive Dressing, 4x4 in Discharge Instruction: Apply to Left Great toe to protect toe GRAFIX Discharge Instruction: Skin Sub Secondary Dressing Woven Gauze Sponges 2x2 in Discharge Instruction: Apply over primary dressing as directed. Secured With Conforming Stretch Gauze Bandage, Sterile 2x75 (in/in) Discharge Instruction: Secure with stretch gauze as directed. Paper Tape, 1x10 (in/yd) Discharge Instruction: Secure dressing with tape as directed. Compression Wrap Compression Stockings Add-Ons Electronic Signature(s) Signed: 07/28/2022 1:35:24 PM By: Dellie Catholic RN Entered By: Dellie Catholic on 07/28/2022 12:56:30 -------------------------------------------------------------------------------- Vitals Details Patient Name: Date of Service: [name], [name]. 07/28/2022 12:30 PM Medical Record Number: [id number] Patient Account Number: [id number] Date of Birth/Sex: Treating RN: July 24, 1931 (88 y.o. America Kulak Primary Care Dilan Novosad: SYSTEM, PRO V IDER Other Clinician: Referring Ramata Strothman: Treating Naman Spychalski/Extender: Jadene Pierini in Treatment: 25 Vital Signs Time Taken: 12:44 Temperature (F): 97.9 Pulse (bpm): 90 Respiratory Rate (breaths/min): 18 Blood Pressure (mmHg): 138/83 Reference Range: 80 - 120 mg / dl Electronic Signature(s) Signed: 07/28/2022 1:35:24 PM By: Dellie Catholic RN Entered By: Dellie Catholic on 07/28/2022 12:53:18

## 2022-08-04 ENCOUNTER — Encounter (HOSPITAL_BASED_OUTPATIENT_CLINIC_OR_DEPARTMENT_OTHER): Payer: Medicare Other | Admitting: General Surgery

## 2022-08-04 DIAGNOSIS — E11621 Type 2 diabetes mellitus with foot ulcer: Secondary | ICD-10-CM | POA: Diagnosis not present

## 2022-08-04 NOTE — Progress Notes (Signed)
[name], [name] ([id number]) Visit Report for 08/04/2022 Chief Complaint Document Details Patient Name: Date of Service: [name], [name] 08/04/2022 1:15 PM Medical Record Number: [id number] Patient Account Number: [id number] Date of Birth/Sex: Treating RN: Jul 19, 1931 (88 y.o. F) Primary Care Provider: SYSTEM, PRO V IDER Other Clinician: Referring Provider: Treating Provider/Extender: [name] in Treatment: 26 Information Obtained from: Patient Chief Complaint Patient presents to the wound care center with open non-healing surgical wound(s) Electronic Signature(s) Signed: 08/04/2022 2:01:00 PM By: Duanne Guess MD FACS Entered By: Duanne Guess on 08/04/2022 14:01:00 -------------------------------------------------------------------------------- Debridement Details Patient Name: Date of Service: [name], [name]. 08/04/2022 1:15 PM Medical Record Number: [id number] Patient Account Number: [id number] Date of Birth/Sex: Treating RN: 18-Sep-1931 (88 y.o. [name] Primary Care Provider: SYSTEM, PRO V IDER Other Clinician: Referring Provider: Treating Provider/Extender: [name] in Treatment: 26 Debridement Performed for Assessment: Wound #1 Left T Great oe Performed By: Physician Duanne Guess, MD Debridement Type: Debridement Severity of Tissue Pre Debridement: Fat layer exposed Level of Consciousness (Pre-procedure): Awake and Alert Pre-procedure Verification/Time Out Yes - 13:45 Taken: Start Time: 13:45 Pain Control: Lidocaine 4% T opical Solution T Area Debrided (L x W): otal 0.5 (cm) x 0.6 (cm) = 0.3 (cm) Tissue and other material debrided: Non-Viable, Callus Level: Non-Viable Tissue Debridement Description: Selective/Open Wound Instrument: Curette Bleeding: Minimum Hemostasis Achieved: Pressure End Time: 13:46 Procedural Pain: 0 Post Procedural Pain: 0 Response to Treatment: Procedure was tolerated well Level of  Consciousness (Post- Awake and Alert procedure): Post Debridement Measurements of Total Wound Length: (cm) 0.5 Width: (cm) 0.6 Depth: (cm) 0.1 Volume: (cm) 0.024 Character of Wound/Ulcer Post Debridement: Improved Severity of Tissue Post Debridement: Fat layer exposed Post Procedure Diagnosis Same as Pre-procedure Notes Scribed for Dr Lady Gary by [email or web address] Electronic Signature(s) Unsigned Entered By: Karie Schwalbe on 08/04/2022 14:01:30 -------------------------------------------------------------------------------- Problem List Details Patient Name: Date of Service: [name], EY 08/04/2022 1:15 PM Medical Record Number: [id number] Patient Account Number: [id number] Date of Birth/Sex: Treating RN: 1931/07/03 (88 y.o. Tommye Standard Primary Care Provider: SYSTEM, PRO Rodena Goldmann Other Clinician: Referring Provider: Treating Provider/Extender: [name] in Treatment: 26 Active Problems ICD-10 Encounter Code Description Active Date MDM Diagnosis E11.621 Type 2 diabetes mellitus with foot ulcer 02/01/2022 No Yes L97.522 Non-pressure chronic ulcer of other part of left foot with fat layer exposed 02/01/2022 No Yes I50.32 Chronic diastolic (congestive) heart failure 02/01/2022 No Yes I27.20 Pulmonary hypertension, unspecified 02/01/2022 No Yes Inactive Problems Resolved Problems Electronic Signature(s) Signed: 08/04/2022 2:00:44 PM By: Duanne Guess MD FACS Entered By: Duanne Guess on 08/04/2022 14:00:44

## 2022-08-04 NOTE — Progress Notes (Signed)
[name], [name] ([id number]) Visit Report for 08/04/2022 Arrival Information Details Patient Name: Date of Service: [name], [name] 08/04/2022 1:15 PM Medical Record Number: [id number] Patient Account Number: [id number] Date of Birth/Sex: Treating RN: 1931/07/03 (88 y.o. Elam Dutch Primary Care Shareef Eddinger: SYSTEM, PRO Ave Filter Other Clinician: Referring Merced Hanners: Treating Kasiyah Platter/Extender: Jadene Pierini in Treatment: 26 Visit Information History Since Last Visit Added or deleted any medications: No Patient Arrived: Wheel Chair Any new allergies or adverse reactions: No Arrival Time: 13:29 Had a fall or experienced change in No Accompanied By: son activities of daily living that may affect Transfer Assistance: None risk of falls: Patient Requires Transmission-Based Precautions: No Signs or symptoms of abuse/neglect since last visito No Patient Has Alerts: Yes Hospitalized since last visit: No Patient Alerts: ABI not obtainable Implantable device outside of the clinic excluding No cellular tissue based products placed in the center since last visit: Has Dressing in Place as Prescribed: Yes Pain Present Now: Yes Notes stays in wheelchair Electronic Signature(s) Signed: 08/04/2022 5:22:18 PM By: Baruch Gouty RN, BSN Entered By: Baruch Gouty on 08/04/2022 13:33:12 -------------------------------------------------------------------------------- Encounter Discharge Information Details Patient Name: Date of Service: [name], [name] Camps. 08/04/2022 1:15 PM Medical Record Number: [id number] Patient Account Number: [id number] Date of Birth/Sex: Treating RN: [birth year] (88 y.o. America Pottenger Primary Care Zonia Caplin: SYSTEM, PRO Ave Filter Other Clinician: Referring Christianna Belmonte: Treating Midori Dado/Extender: Jadene Pierini in Treatment: 26 Encounter Discharge Information Items Post Procedure Vitals Discharge Condition: Stable Temperature (F): 98.6 Ambulatory  Status: Wheelchair Pulse (bpm): 83 Discharge Destination: Home Respiratory Rate (breaths/min): 18 Transportation: Private Auto Blood Pressure (mmHg): 137/81 Accompanied By: son Schedule Follow-up Appointment: Yes Clinical Summary of Care: Patient Declined Electronic Signature(s) Signed: 08/04/2022 5:31:33 PM By: Dellie Catholic RN Entered By: Dellie Catholic on 08/04/2022 17:22:36 -------------------------------------------------------------------------------- Lower Extremity Assessment Details Patient Name: Date of Service: [name], [name] 08/04/2022 1:15 PM Medical Record Number: [id number] Patient Account Number: [id number] Date of Birth/Sex: Treating RN: Jan 10, 1931 (88 y.o. Elam Dutch Primary Care Dominic Mahaney: SYSTEM, PRO Ave Filter Other Clinician: Referring Emma Schupp: Treating Chino Sardo/Extender: Jadene Pierini in Treatment: 26 Edema Assessment Assessed: [Left: No] [Right: No] Edema: [Left: Ye] [Right: s] Calf Left: Right: Point of Measurement: 26 cm From Medial Instep 39 cm Ankle Left: Right: Point of Measurement: 10 cm From Medial Instep 25 cm Vascular Assessment Pulses: Dorsalis Pedis Palpable: [Left:Yes] Electronic Signature(s) Signed: 08/04/2022 5:22:18 PM By: Baruch Gouty RN, BSN Entered By: Baruch Gouty on 08/04/2022 13:41:00 -------------------------------------------------------------------------------- Multi Wound Chart Details Patient Name: Date of Service: [name], [name] Camps. 08/04/2022 1:15 PM Medical Record Number: [id number] Patient Account Number: [id number] Date of Birth/Sex: Treating RN: 03-18-1931 (88 y.o. F) Primary Care Edithe Dobbin: SYSTEM, PRO V IDER Other Clinician: Referring Torrey Ballinas: Treating Brissa Asante/Extender: Jadene Pierini in Treatment: 26 Vital Signs Height(in): Pulse(bpm): 66 Weight(lbs): Blood Pressure(mmHg): 137/81 Body Mass Index(BMI): Temperature(F): 98.6 Respiratory Rate(breaths/min): 18 Photos:  [1:No Photos Left T Great oe] [N/A:N/A N/A] Wound Location: [1:Gradually Appeared] [N/A:N/A] Wounding Event: [1:Diabetic Wound/Ulcer of the Lower] [N/A:N/A] Primary Etiology: [1:Extremity Arrhythmia, Congestive Heart Failure,] [N/A:N/A] Comorbid History: [1:Hypertension, Type II Diabetes, Gout, Osteoarthritis 09/29/2021] [N/A:N/A] Date Acquired: [1:26] [N/A:N/A] Weeks of Treatment: [1:Open] [N/A:N/A] Wound Status: [1:No] [N/A:N/A] Wound Recurrence: [1:0.5x0.6x0.1] [N/A:N/A] Measurements L x W x D (cm) [1:0.236] [N/A:N/A] A (cm) : rea [1:0.024] [N/A:N/A] Volume (cm) : [1:16.60%] [N/A:N/A] % Reduction in A rea: [1:57.90%] [N/A:N/A] % Reduction in Volume: [1:Grade 2] [N/A:N/A] Classification: [1:Small] [N/A:N/A] Exudate A mount: [1:Serous] [N/A:N/A] Exudate Type: [  1:amber] [N/A:N/A] Exudate Color: [1:Flat and Intact] [N/A:N/A] Wound Margin: [1:Medium (34-66%)] [N/A:N/A] Granulation A mount: [1:Pink] [N/A:N/A] Granulation Quality: [1:Medium (34-66%)] [N/A:N/A] Necrotic A mount: [1:Fat Layer (Subcutaneous Tissue): Yes N/A] Exposed Structures: [1:Bone: Yes Fascia: No Tendon: No Muscle: No Joint: No Small (1-33%)] [N/A:N/A] Treatment Notes Electronic Signature(s) Signed: 08/04/2022 2:00:53 PM By: Fredirick Maudlin MD FACS Entered By: Fredirick Maudlin on 08/04/2022 14:00:52 -------------------------------------------------------------------------------- Multi-Disciplinary Care Plan Details Patient Name: Date of Service: [name], [name] Camps. 08/04/2022 1:15 PM Medical Record Number: [id number] Patient Account Number: [id number] Date of Birth/Sex: Treating RN: 07/15/1931 (88 y.o. Elam Dutch Primary Care Todd Jelinski: SYSTEM, PRO Ave Filter Other Clinician: Referring Noheli Melder: Treating Graysyn Bache/Extender: Jadene Pierini in Treatment: 26 Multidisciplinary Care Plan reviewed with physician Active Inactive Nutrition Nursing Diagnoses: Impaired glucose control: actual or  potential Potential for alteratiion in Nutrition/Potential for imbalanced nutrition Goals: Patient/caregiver will maintain therapeutic glucose control Date Initiated: 02/01/2022 Target Resolution Date: 08/20/2022 Goal Status: Active Interventions: Assess HgA1c results as ordered upon admission and as needed Assess patient nutrition upon admission and as needed per policy Provide education on elevated blood sugars and impact on wound healing Provide education on nutrition Treatment Activities: Education provided on Nutrition : 02/01/2022 Notes: Wound/Skin Impairment Nursing Diagnoses: Impaired tissue integrity Knowledge deficit related to ulceration/compromised skin integrity Goals: Patient/caregiver will verbalize understanding of skin care regimen Date Initiated: 02/01/2022 Date Inactivated: 05/18/2022 Target Resolution Date: 04/28/2022 Goal Status: Met Ulcer/skin breakdown will have a volume reduction of 30% by week 4 Date Initiated: 02/01/2022 Date Inactivated: 03/24/2022 Target Resolution Date: 03/31/2022 Goal Status: Unmet Unmet Reason: PAD Ulcer/skin breakdown will have a volume reduction of 50% by week 8 Date Initiated: 04/06/2022 Target Resolution Date: 08/20/2022 Goal Status: Active Interventions: Assess patient/caregiver ability to obtain necessary supplies Assess patient/caregiver ability to perform ulcer/skin care regimen upon admission and as needed Assess ulceration(s) every visit Provide education on ulcer and skin care Notes: Electronic Signature(s) Signed: 08/04/2022 5:22:18 PM By: Baruch Gouty RN, BSN Entered By: Baruch Gouty on 08/04/2022 13:42:46 -------------------------------------------------------------------------------- Pain Assessment Details Patient Name: Date of Service: [name] 08/04/2022 1:15 PM Medical Record Number: [id number] Patient Account Number: [id number] Date of Birth/Sex: Treating RN: 1931-05-31 (88 y.o. Elam Dutch Primary Care Totiana Everson: SYSTEM, PRO Ave Filter Other Clinician: Referring Jen Eppinger: Treating Larico Dimock/Extender: Jadene Pierini in Treatment: 26 Active Problems Location of Pain Severity and Description of Pain Patient Has Paino Yes Site Locations Pain Location: Pain in Ulcers With Dressing Change: Yes Rate the pain. Current Pain Level: 0 Worst Pain Level: 7 Least Pain Level: 0 Character of Pain Describe the Pain: Other: stinging Pain Management and Medication Current Pain Management: Medication: Yes Is the Current Pain Management Adequate: Adequate How does your wound impact your activities of daily livingo Sleep: Yes Bathing: No Appetite: No Relationship With Others: No Bladder Continence: No Emotions: No Bowel Continence: No Work: No Toileting: No Drive: No Dressing: No Hobbies: No Electronic Signature(s) Signed: 08/04/2022 5:22:18 PM By: Baruch Gouty RN, BSN Entered By: Baruch Gouty on 08/04/2022 13:36:06 -------------------------------------------------------------------------------- Patient/Caregiver Education Details Patient Name: Date of Service: [name] 9/14/2023andnbsp1:15 PM Medical Record Number: [id number] Patient Account Number: [id number] Date of Birth/Gender: Treating RN: [birth year] (88 y.o. Elam Dutch Primary Care Physician: SYSTEM, PRO V IDER Other Clinician: Referring Physician: Treating Physician/Extender: Jadene Pierini in Treatment: 26 Education Assessment Education Provided To: Patient Education Topics Provided Tissue Oxygenation: Methods: Explain/Verbal Responses: Reinforcements needed, State content correctly Wound/Skin Impairment: Methods: Explain/Verbal Responses: Reinforcements  needed, State content correctly Electronic Signature(s) Signed: 08/04/2022 5:22:18 PM By: Baruch Gouty RN, BSN Entered By: Baruch Gouty on 08/04/2022  13:43:09 -------------------------------------------------------------------------------- Wound Assessment Details Patient Name: Date of Service: [name] 08/04/2022 1:15 PM Medical Record Number: [id number] Patient Account Number: [id number] Date of Birth/Sex: Treating RN: Feb 18, 1931 (88 y.o. Elam Dutch Primary Care Jocelyne Reinertsen: SYSTEM, PRO V IDER Other Clinician: Referring Florentino Laabs: Treating Lajean Boese/Extender: Jadene Pierini in Treatment: 26 Wound Status Wound Number: 1 Primary Diabetic Wound/Ulcer of the Lower Extremity Etiology: Wound Location: Left T Great oe Wound Open Wounding Event: Gradually Appeared Status: Date Acquired: 09/29/2021 Comorbid Arrhythmia, Congestive Heart Failure, Hypertension, Type II Weeks Of Treatment: 26 History: Diabetes, Gout, Osteoarthritis Clustered Wound: No Wound Measurements Length: (cm) 0.5 Width: (cm) 0.6 Depth: (cm) 0.1 Area: (cm) 0.236 Volume: (cm) 0.024 % Reduction in Area: 16.6% % Reduction in Volume: 57.9% Epithelialization: Small (1-33%) Tunneling: No Undermining: No Wound Description Classification: Grade 2 Wound Margin: Flat and Intact Exudate Amount: Small Exudate Type: Serous Exudate Color: amber Foul Odor After Cleansing: No Slough/Fibrino Yes Wound Bed Granulation Amount: Medium (34-66%) Exposed Structure Granulation Quality: Pink Fascia Exposed: No Necrotic Amount: Medium (34-66%) Fat Layer (Subcutaneous Tissue) Exposed: Yes Necrotic Quality: Adherent Slough Tendon Exposed: No Muscle Exposed: No Joint Exposed: No Bone Exposed: Yes Treatment Notes Wound #1 (Toe Great) Wound Laterality: Left Cleanser Wound Cleanser Discharge Instruction: Cleanse the wound with wound cleanser prior to applying a clean dressing using gauze sponges, not tissue or cotton balls. Peri-Wound Care Topical Primary Dressing Optifoam Non-Adhesive Dressing, 4x4 in Discharge Instruction: Apply to Left Great  toe to protect toe GRAFIX Discharge Instruction: Skin Sub Secondary Dressing Woven Gauze Sponges 2x2 in Discharge Instruction: Apply over primary dressing as directed. Secured With Conforming Stretch Gauze Bandage, Sterile 2x75 (in/in) Discharge Instruction: Secure with stretch gauze as directed. Paper Tape, 1x10 (in/yd) Discharge Instruction: Secure dressing with tape as directed. Compression Wrap Compression Stockings Add-Ons Electronic Signature(s) Signed: 08/04/2022 5:22:18 PM By: Baruch Gouty RN, BSN Entered By: Baruch Gouty on 08/04/2022 13:42:12 -------------------------------------------------------------------------------- Vitals Details Patient Name: Date of Service: [name], [name] Camps. 08/04/2022 1:15 PM Medical Record Number: [id number] Patient Account Number: [id number] Date of Birth/Sex: Treating RN: 06/23/1931 (88 y.o. Elam Dutch Primary Care Kalei Mckillop: SYSTEM, PRO V IDER Other Clinician: Referring Sabriya Yono: Treating Lylee Corrow/Extender: Jadene Pierini in Treatment: 26 Vital Signs Time Taken: 13:35 Temperature (F): 98.6 Pulse (bpm): 83 Respiratory Rate (breaths/min): 18 Blood Pressure (mmHg): 137/81 Reference Range: 80 - 120 mg / dl Electronic Signature(s) Signed: 08/04/2022 5:22:18 PM By: Baruch Gouty RN, BSN Entered By: Baruch Gouty on 08/04/2022 13:35:20

## 2022-08-06 NOTE — Progress Notes (Signed)
Remote pacemaker transmission.   

## 2022-08-11 ENCOUNTER — Encounter (HOSPITAL_BASED_OUTPATIENT_CLINIC_OR_DEPARTMENT_OTHER): Payer: Medicare Other | Admitting: General Surgery

## 2022-08-11 DIAGNOSIS — E11621 Type 2 diabetes mellitus with foot ulcer: Secondary | ICD-10-CM | POA: Diagnosis not present

## 2022-08-11 NOTE — Progress Notes (Signed)
[name], [name] ([id number]) Visit Report for 08/11/2022 Chief Complaint Document Details Patient Name: Date of Service: [name], [name] 08/11/2022 1:15 PM Medical Record Number: [id number] Patient Account Number: [id number] Date of Birth/Sex: Treating RN: 21-Jun-1931 (88 y.o. America Groom Primary Care Provider: SYSTEM, PRO Ave Filter Other Clinician: Referring Provider: Treating Provider/Extender: Jadene Pierini in Treatment: 27 Information Obtained from: Patient Chief Complaint Patient presents to the wound care center with open non-healing surgical wound(s) Electronic Signature(s) Signed: 08/11/2022 2:32:11 PM By: Fredirick Maudlin MD FACS Entered By: Fredirick Maudlin on 08/11/2022 14:32:11 -------------------------------------------------------------------------------- Debridement Details Patient Name: Date of Service: [name]. 08/11/2022 1:15 PM Medical Record Number: [id number] Patient Account Number: [id number] Date of Birth/Sex: Treating RN: 04-14-1931 (88 y.o. America Fuelling Primary Care Provider: SYSTEM, PRO V IDER Other Clinician: Referring Provider: Treating Provider/Extender: Jadene Pierini in Treatment: 27 Debridement Performed for Assessment: Wound #1 Left T Great oe Performed By: Physician Fredirick Maudlin, MD Debridement Type: Debridement Severity of Tissue Pre Debridement: Fat layer exposed Level of Consciousness (Pre-procedure): Awake and Alert Pre-procedure Verification/Time Out Yes - 14:15 Taken: Start Time: 14:15 Pain Control: Lidocaine 4% T opical Solution T Area Debrided (L x W): otal 0.1 (cm) x 0.1 (cm) = 0.01 (cm) Tissue and other material debrided: Non-Viable, Callus, Slough, Slough Level: Non-Viable Tissue Debridement Description: Selective/Open Wound Instrument: Curette Specimen: Tissue Culture Number of Specimens T aken: 1 Bleeding: Minimum Hemostasis Achieved: Pressure End Time: 14:16 Procedural Pain:  0 Post Procedural Pain: 0 Response to Treatment: Procedure was tolerated well Level of Consciousness (Post- Awake and Alert procedure): Post Debridement Measurements of Total Wound Length: (cm) 0.1 Width: (cm) 0.1 Depth: (cm) 0.1 Volume: (cm) 0.001 Character of Wound/Ulcer Post Debridement: Improved Severity of Tissue Post Debridement: Fat layer exposed Post Procedure Diagnosis Same as Pre-procedure Notes Scribed for Dr. Celine Ahr by J.Scotton Electronic Signature(s) Signed: 08/11/2022 3:00:56 PM By: Fredirick Maudlin MD FACS Signed: 08/11/2022 5:27:48 PM By: Dellie Catholic RN Entered By: Dellie Catholic on 08/11/2022 14:57:40 -------------------------------------------------------------------------------- HPI Details Patient Name: Date of Service: [name]. 08/11/2022 1:15 PM Medical Record Number: [id number] Patient Account Number: [id number] Date of Birth/Sex: Treating RN: 13-May-1931 (88 y.o. America Ruminski Primary Care Provider: SYSTEM, PRO Ave Filter Other Clinician: Referring Provider: Treating Provider/Extender: Jadene Pierini in Treatment: 27 History of Present Illness HPI Description: ADMISSION 02/01/2022 This is an 88 year old woman who is a resident of [facility]. She has a past medical history notable for type 2 diabetes mellitus, congestive heart failure, pulmonary hypertension, and complete heart block. She is accompanied today by her son. Approximately 4 months ago, she had an ingrown toe nail removed. Apparently this went a bit deep. Since that time, it has not healed. The patient is nonambulatory. Apparently they have been applying Santyl to the site at her facility. She was on antibiotics but I am not certain which specific drugs she took. She completed her last course a few weeks ago. We were unable to obtain ABIs in clinic secondary to the patient's body habitus. She has not had any formal vascular studies nor any x-ray of the area. 02/09/2022:  The wound is roughly the same size today, but the amount of slough in the base has decreased significantly. She apparently had an x-ray done at her facility, but we do not have the report; per verbal report from facility staff, "there was nothing there." We had requested formal ABI/TBI studies, but these were not done. She has been in  Santyl under Hydrofera Blue. 02/16/2022: The wound measured about the same today, but to my eye, it appears rather smaller. The slough in the base is minimal. Her vascular studies are scheduled for Friday. We have been using Santyl under Hydrofera Blue. No concern for infection. 02/23/2022: Once again, the wound has been measured about the same size, but I think it looks a bit smaller. There is slough at the base. She had her ABIs done last week and these show severe bilateral peripheral arterial disease. We received the report of the foot x-ray that was ordered. There is no concern for osteomyelitis at this time. 03/02/2022: The wound is smaller today with a small amount of slough at the base. We placed a referral to vein and vascular surgery last week in response to her severe peripheral arterial disease, but she does not yet have an appointment on the books. 03/16/2022: She saw Dr. Lenell Antu in the vascular surgery department yesterday. He did not think she was a good candidate for any revascularization options and suggested that she continue local wound care for another month. If she fails to heal in that time, they may consider an angiogram with potential intervention. The wound today is roughly the same size but there is less exposed bone at the base and the surface is clean without any substantial slough. 03/24/2022: The wound appears about the same. There is some periwound callus that has heaped up. We changed her to Prisma silver collagen last week. 04/06/2022: No significant change to the wound. It is neither improving nor deteriorating. She continues to accumulate some  periwound callus around the margin. Bone is exposed at the base. 04/20/2022: The wound has improved quite a bit over the past 2 weeks. It is smaller and there is no real accumulation of periwound callus. She had follow-up with Dr. Lenell Antu yesterday and he felt that the wound was progressing well enough that no arterial intervention was necessary. 05/18/2022: The patient has been absent due to a fall from her wheelchair during her last transportation that was supposed to be to her clinic. Today, the wound is about the same size. There is still bone exposed at the base. There is periwound callus accumulation. No concern for infection. 06/01/2022: The wound is actually a little bit smaller today and the surface is not as pale. She still has accumulated periwound callus. 06/15/2022: The wound continues to contract, albeit slowly. No concern for infection. 06/28/2022: The wound is slightly smaller again today. She continues to accumulate callus and there is some periwound epibole at the margins. No concern for gross infection. 07/11/2022: The wound is roughly the same size with just a small opening in the center. She has been approved for Grafix. 07/18/2022: The wound has contracted somewhat and the surface has a healthier pink look to it. We applied Grafix last week and she is here for application #2. 07/28/2022: The wound is about the same size but the surface continues to improve and appear more vital. She is here for Grafix application #3. 08/04/2022: There is still a stubborn central portion of her toe wound that is not closing in yet. The bone remains exposed. She is here for Grafix application #4. 08/11/2022: Today when I debrided the eschar and callus from her wound, purulent drainage was expressed from her wound. The underlying surface is unchanged with bone exposed. She has been having more pain in the toe and this may explain why. Electronic Signature(s) Signed: 08/11/2022 2:32:56 PM By: Duanne Guess MD  FACS Entered By: Duanne Guess on 08/11/2022 14:32:56 -------------------------------------------------------------------------------- Physical Exam Details Patient Name: Date of Service: [name], [name] 08/11/2022 1:15 PM Medical Record Number: [id number] Patient Account Number: [id number] Date of Birth/Sex: Treating RN: 08-07-1931 (88 y.o. [name] Primary Care Provider: SYSTEM, PRO Rodena Goldmann Other Clinician: Referring Provider: Treating Provider/Extender: [name] in Treatment: 27 Constitutional No acute distress.[name] Respiratory Normal work of breathing on room air.. Notes 08/11/2022: Today when I debrided the eschar and callus from her wound, purulent drainage was expressed from her wound. The underlying surface is unchanged with bone exposed. Electronic Signature(s) Signed: 08/11/2022 2:33:24 PM By: Duanne Guess MD FACS Entered By: Duanne Guess on 08/11/2022 14:33:23 -------------------------------------------------------------------------------- Physician Orders Details Patient Name: Date of Service: [name], VERTIE KRAKOW 08/11/2022 1:15 PM Medical Record Number: [id number] Patient Account Number: [id number] Date of Birth/Sex: Treating RN: 1930/12/13 (88 y.o. [name] Primary Care Provider: SYSTEM, PRO V IDER Other Clinician: Referring Provider: Treating Provider/Extender: [name] in Treatment: 27 Verbal / Phone Orders: No Diagnosis Coding ICD-10 Coding Code Description E11.621 Type 2 diabetes mellitus with foot ulcer L97.522 Non-pressure chronic ulcer of other part of left foot with fat layer exposed I50.32 Chronic diastolic (congestive) heart failure I27.20 Pulmonary hypertension, unspecified Follow-up Appointments ppointment in 1 week. - Dr Lady Gary Room 3 Thursday 08/18/22 Return A Other: - Grafix #4 (08/04/22) Anesthetic (In clinic) Topical Lidocaine 4% applied to wound bed - In Clinic Cellular or Tissue Based  Products Wound #1 Left T Great oe daptic or Mepitel. (DO NOT REMOVE). - *** Cellular or Tissue Based Product applied to wound bed, secured with steri-strips, cover with A Do not remove dressing for a week**** Grafix PL PRIME 67mm (Disc) Bathing/ Shower/ Hygiene May shower with protection but do not get wound dressing(s) wet. - Do Not Left Great T wet**** Skin Substitute (Grafix #4) applied 08/04/22**** oe GRAFIX ON HOLD 08/11/22 Off-Loading Other: - No direct pressure on toes Wound Treatment Wound #1 - T Great oe Wound Laterality: Left Cleanser: Wound Cleanser 1 x Per Day/30 Days Discharge Instructions: Cleanse the wound with wound cleanser prior to applying a clean dressing using gauze sponges, not tissue or cotton balls. Topical: Gentamicin 1 x Per Day/30 Days Discharge Instructions: As directed by physician Prim Dressing: KerraCel Ag Gelling Fiber Dressing, 2x2 in (silver alginate) 1 x Per Day/30 Days ary Discharge Instructions: Apply silver alginate to wound bed as instructed Prim Dressing: Optifoam Non-Adhesive Dressing, 4x4 in 1 x Per Day/30 Days ary Discharge Instructions: Apply to Left Great toe to protect toe Prim Dressing: GRAFIX 1 x Per Day/30 Days ary Discharge Instructions: On hold 08/11/22 Secondary Dressing: Woven Gauze Sponges 2x2 in 1 x Per Day/30 Days Discharge Instructions: Apply over primary dressing as directed. Secured With: Insurance underwriter, Sterile 2x75 (in/in) 1 x Per Day/30 Days Discharge Instructions: Secure with stretch gauze as directed. Secured With: Paper Tape, 1x10 (in/yd) 1 x Per Day/30 Days Discharge Instructions: Secure dressing with tape as directed. Laboratory naerobe culture (MICRO) - Left Great T - (ICD10 E11.621 - Type 2 diabetes mellitus with foot oe Bacteria identified in Unspecified specimen by A ulcer) LOINC Code: 635-3 Convenience Name: Anaerobic culture Radiology X-ray, foot - Left foot Rule Out Osteomyelitis -  (ICD10 G92.010 - Non-pressure chronic ulcer of other part of left foot with fat layer exposed) Electronic Signature(s) Signed: 08/11/2022 3:00:56 PM By: Duanne Guess MD FACS Signed: 08/11/2022 5:27:48 PM By: Karie Schwalbe RN Entered By: Karie Schwalbe  on 08/11/2022 14:45:38 Prescription 08/11/2022 -------------------------------------------------------------------------------- [name]. Duanne Guess MD Patient Name: Provider: 26-Feb-1931 [id number] Date of Birth: NPI#: [id number] Sex: DEA #: [phone] [id number] Phone #: License #: Eligha Bridegroom [facility] Patient Address: [facility] [address] [place], [place] [address] [place], [place] [address] [phone] Allergies aspirin; tramadol; naproxen; shellfish derived Provider's Orders X-ray, foot - ICD10: N02.725 - Left foot Rule Out Osteomyelitis Hand Signature: Date(s): Electronic Signature(s) Signed: 08/11/2022 3:00:56 PM By: Duanne Guess MD FACS Signed: 08/11/2022 5:27:48 PM By: Karie Schwalbe RN Entered By: Karie Schwalbe on 08/11/2022 14:45:39 -------------------------------------------------------------------------------- Problem List Details Patient Name: Date of Service: [name]. 08/11/2022 1:15 PM Medical Record Number: [id number] Patient Account Number: [id number] Date of Birth/Sex: Treating RN: 1931-02-15 (88 y.o. 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 08/11/2022 1:15 PM Medical Record Number: [id number] Patient Account Number: [id number] Date of Birth/Sex: Treating RN: [birth year] (88 y.o. [name] Primary Care Provider: SYSTEM, PRO Rodena Goldmann Other Clinician: Referring Provider: Treating Provider/Extender: [name] in Treatment: 27 Subjective Chief Complaint Information obtained from Patient Patient presents to the wound care center with open non-healing surgical wound(s) History of Present Illness (HPI) ADMISSION 02/01/2022 This is an 88 year old woman who is a resident of [facility]. She has a past medical history notable for type 2 diabetes mellitus, congestive heart failure, pulmonary hypertension, and complete heart block. She is accompanied today by her son. Approximately 4 months ago, she had an ingrown toe nail removed. Apparently this went a bit deep. Since that time, it has not healed. The patient is nonambulatory. Apparently they have been applying Santyl to the site at her facility. She was on antibiotics but I am not certain which specific drugs she took. She completed her last course a few weeks ago. We were unable to obtain ABIs in clinic secondary to the patient's body habitus. She has not had any formal vascular studies nor any x-ray of the area. 02/09/2022: The wound is roughly the same size today, but the amount of slough in the base has decreased significantly. She apparently had an x-ray done at her facility, but we do not have the report; per verbal report from facility staff, "there was nothing there." We had requested  formal ABI/TBI studies, but these were not done. She has been in [place] under [employer]	[place]. 02/16/2022: The wound measured about the same today, but to my eye, it appears rather smaller. The slough in the base is minimal. Her vascular studies are scheduled for Friday. We have been using Santyl under Hydrofera Blue. No concern for infection. 02/23/2022: Once again, the wound has been measured about the same size, but I think it looks a bit smaller. There is slough at the base. She had her ABIs done last week and these show  severe bilateral peripheral arterial disease. We received the report of the foot x-ray that was ordered. There is no concern for osteomyelitis at this time. 03/02/2022: The wound is smaller today with a small amount of slough at the base. We placed a referral to vein and vascular surgery last week in response to her severe peripheral arterial disease, but she does not yet have an appointment on the books. 03/16/2022: She saw Dr. Lenell Antu in the vascular surgery department yesterday. He did not think she was a good candidate for any revascularization options and suggested that she continue local wound care for another month. If she fails to heal in that time, they may consider an angiogram with potential intervention. The wound today is roughly the same size but there is less exposed bone at the base and the surface is clean without any substantial slough. 03/24/2022: The wound appears about the same. There is some periwound callus that has heaped up. We changed her to Prisma silver collagen last week. 04/06/2022: No significant change to the wound. It is neither improving nor deteriorating. She continues to accumulate some periwound callus around the margin. Bone is exposed at the base. 04/20/2022: The wound has improved quite a bit over the past 2 weeks. It is smaller and there is no real accumulation of periwound callus. She had follow-up with Dr. Lenell Antu yesterday and he felt that the  wound was progressing well enough that no arterial intervention was necessary. 05/18/2022: The patient has been absent due to a fall from her wheelchair during her last transportation that was supposed to be to her clinic. Today, the wound is about the same size. There is still bone exposed at the base. There is periwound callus accumulation. No concern for infection. 06/01/2022: The wound is actually a little bit smaller today and the surface is not as pale. She still has accumulated periwound callus. 06/15/2022: The wound continues to contract, albeit slowly. No concern for infection. 06/28/2022: The wound is slightly smaller again today. She continues to accumulate callus and there is some periwound epibole at the margins. No concern for gross infection. 07/11/2022: The wound is roughly the same size with just a small opening in the center. She has been approved for Grafix. 07/18/2022: The wound has contracted somewhat and the surface has a healthier pink look to it. We applied Grafix last week and she is here for application #2. 07/28/2022: The wound is about the same size but the surface continues to improve and appear more vital. She is here for Grafix application #3. 08/04/2022: There is still a stubborn central portion of her toe wound that is not closing in yet. The bone remains exposed. She is here for Grafix application #4. 08/11/2022: Today when I debrided the eschar and callus from her wound, purulent drainage was expressed from her wound. The underlying surface is unchanged with bone exposed. She has been having more pain in the toe and this may explain why. Patient History Social History Never smoker, Marital Status - Widowed, Alcohol Use - Never, Drug Use - No History, Caffeine Use - Rarely. Medical History Cardiovascular Patient has history of Arrhythmia - AV block, complete heart block, Congestive Heart Failure, Hypertension Endocrine Patient has history of Type II  Diabetes Musculoskeletal Patient has history of Gout, Osteoarthritis Medical A Surgical History Notes nd Cardiovascular Pulmonary Hypertension Endocrine Hypoglycemia Neurologic Acute encephalopathy Objective Constitutional No acute distress.[name] Respiratory Normal work of breathing on room air.. General Notes: 08/11/2022: Today when I debrided the eschar  and callus from her wound, purulent drainage was expressed from her wound. The underlying surface is unchanged with bone exposed. Integumentary (Hair, Skin) Wound #1 status is Open. Original cause of wound was Gradually Appeared. The date acquired was: 09/29/2021. The wound has been in treatment 27 weeks. The wound is located on the Left T Great. The wound measures 0.1cm length x 0.1cm width x 0.1cm depth; 0.008cm^2 area and 0.001cm^3 volume. There is bone oe and Fat Layer (Subcutaneous Tissue) exposed. There is no tunneling or undermining noted. There is a small amount of serous drainage noted. The wound margin is flat and intact. There is medium (34-66%) pink granulation within the wound bed. There is a medium (34-66%) amount of necrotic tissue within the wound bed including Eschar and Adherent Slough. 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 There was a Selective/Open Wound Non-Viable Tissue Debridement with a total area of 0.01 sq cm performed by Duanne Guess, MD. With the following instrument(s): Curette to remove Non-Viable tissue/material. Material removed includes Callus and Slough and after achieving pain control using Lidocaine 4% T opical  Solution. 1 specimen was taken by a Tissue Culture and sent to the lab per facility protocol. A time out was conducted at 14:15, prior to the start of the procedure. A Minimum amount of bleeding was controlled with Pressure. The procedure was tolerated well with a pain level of 0 throughout and a pain level of 0 following the procedure. Post Debridement Measurements: 0.1cm length x 0.1cm width x 0.1cm depth; 0.001cm^3 volume. Character of Wound/Ulcer Post Debridement is improved. Severity of Tissue Post Debridement is: Fat layer exposed. Post procedure Diagnosis Wound #1: Same as Pre-Procedure General Notes: Scribed for Dr. Lady Gary by J.Scotton. Plan Follow-up Appointments: Return Appointment in 1 week. - Dr Lady Gary Room 3 Thursday 08/18/22 Other: - Grafix #4 (08/04/22) Anesthetic: (In clinic) Topical Lidocaine 4% applied to wound bed - In Clinic Cellular or Tissue Based Products: Wound #1 Left T Great: oe Cellular or Tissue Based Product applied to wound bed, secured with steri-strips, cover with Adaptic or Mepitel. (DO NOT REMOVE). - *** Do not remove dressing for a week**** Grafix PL PRIME 69mm (Disc) Bathing/ Shower/ Hygiene: May shower with protection but do not get wound dressing(s) wet. - Do Not Left Great T wet**** Skin Substitute (Grafix #4) applied 08/04/22**** oe Off-Loading: Other: - No direct pressure on toes WOUND #1: - T Great Wound Laterality: Left oe Cleanser: Wound Cleanser 1 x Per Week/30 Days Discharge Instructions: Cleanse the wound with wound cleanser prior to applying a clean dressing using gauze sponges, not tissue or cotton balls. Topical: Gentamicin 1 x Per Week/30 Days Discharge Instructions: As directed by physician Prim Dressing: KerraCel Ag Gelling Fiber Dressing, 2x2 in (silver alginate) 1 x Per Week/30 Days ary Discharge Instructions: Apply silver alginate to wound bed as instructed Prim Dressing: Optifoam Non-Adhesive Dressing, 4x4 in 1 x Per Week/30  Days ary Discharge Instructions: Apply to Left Great toe to protect toe Prim Dressing: GRAFIX 1 x Per Week/30 Days ary Discharge Instructions: On hold 08/11/22 Secondary Dressing: Woven Gauze Sponges 2x2 in 1 x Per Week/30 Days Discharge Instructions: Apply over primary dressing  as directed. Secured With: Insurance underwriter, Sterile 2x75 (in/in) 1 x Per Week/30 Days Discharge Instructions: Secure with stretch gauze as directed. Secured With: Paper T ape, 1x10 (in/yd) 1 x Per Week/30 Days Discharge Instructions: Secure dressing with tape as directed. 08/11/2022: Today when I debrided the eschar and callus from her wound, purulent drainage was expressed from her wound. The underlying surface is unchanged with bone exposed. I used a curette to debride the eschar and callus from her wound. I took a culture of the purulent material. I will follow-up the culture data and tailor antibiotic therapy as indicated. Her facility has his own pharmacy and I have written a prescription for Augmentin 1 tab p.o. twice daily x10 days and to change her dressing daily using gentamicin and silver alginate. I also have requested an x-ray of her foot to evaluate for osteomyelitis. She will follow-up in 1 week. Electronic Signature(s) Signed: 08/11/2022 2:35:01 PM By: Duanne Guess MD FACS Entered By: Duanne Guess on 08/11/2022 14:35:01 -------------------------------------------------------------------------------- HxROS Details Patient Name: Date of Service: [name], [name]. 08/11/2022 1:15 PM Medical Record Number: [id number] Patient Account Number: [id number] Date of Birth/Sex: Treating RN: 26-Jul-1931 (88 y.o. [name] Primary Care Provider: SYSTEM, PRO Rodena Goldmann Other Clinician: Referring Provider: Treating Provider/Extender: [name] in Treatment: 27 Cardiovascular Medical History: Positive for: Arrhythmia - AV block, complete heart block; Congestive Heart  Failure; Hypertension Past Medical History Notes: Pulmonary Hypertension Endocrine Medical History: Positive for: Type II Diabetes Past Medical History Notes: Hypoglycemia Treated with: Oral agents Blood sugar tested every day: Yes Tested : 2-3x a day Musculoskeletal Medical History: Positive for: Gout; Osteoarthritis Neurologic Medical History: Past Medical History Notes: Acute encephalopathy Immunizations Pneumococcal Vaccine: Received Pneumococcal Vaccination: No Implantable Devices No devices added Family and Social History Never smoker; Marital Status - Widowed; Alcohol Use: Never; Drug Use: No History; Caffeine Use: Rarely; Financial Concerns: No; Food, Clothing or Shelter Needs: No; Support System Lacking: No; Transportation Concerns: No Psychologist, prison and probation services) Signed: 08/11/2022 3:00:56 PM By: Duanne Guess MD FACS Signed: 08/11/2022 5:27:48 PM By: Karie Schwalbe RN Entered By: Duanne Guess on 08/11/2022 14:33:01 -------------------------------------------------------------------------------- SuperBill Details Patient Name: Date of Service: [name] 08/11/2022 Medical Record Number: [id number] Patient Account Number: [id number] Date of Birth/Sex: Treating RN: [birth year] (88 y.o. [name] Primary Care Provider: SYSTEM, PRO V IDER Other Clinician: Referring Provider: Treating Provider/Extender: [name] in Treatment: 27 Diagnosis Coding ICD-10 Codes Code Description E11.621 Type 2 diabetes mellitus with foot ulcer L97.522 Non-pressure chronic ulcer of other part of left foot with fat layer exposed I50.32 Chronic diastolic (congestive) heart failure I27.20 Pulmonary hypertension, unspecified Facility Procedures CPT4 Code: 78295621 Description: [phone] - DEBRIDE WOUND 1ST 20 SQ CM OR < ICD-10 Diagnosis Description L97.522 Non-pressure chronic ulcer of other part of left foot with fat layer exposed Modifier: Quantity:  1 Physician Procedures : CPT4 Code Description Modifier [id number] - WC PHYS LEVEL 4 - EST PT 25 ICD-10 Diagnosis Description E11.621 Type 2 diabetes mellitus with foot ulcer L97.522 Non-pressure chronic ulcer of other part of left foot with fat layer exposed I50.32 Chronic  diastolic (congestive) heart failure I27.20 Pulmonary hypertension, unspecified Quantity: 1 : [id number] - WC PHYS DEBR WO ANESTH 20 SQ CM ICD-10 Diagnosis Description L97.522 Non-pressure chronic ulcer of other part of left foot with fat layer exposed Quantity: 1 Electronic Signature(s) Signed: 08/11/2022 2:35:19 PM By: Duanne Guess MD FACS Entered By: Duanne Guess on 08/11/2022 14:35:19

## 2022-08-11 NOTE — Progress Notes (Signed)
[name] ([id number]) Visit Report for 08/11/2022 Arrival Information Details Patient Name: Date of Service: [name], [name] 08/11/2022 1:15 PM Medical Record Number: [id number] Patient Account Number: [id number] Date of Birth/Sex: Treating RN: Apr 18, 1931 (88 y.o. [name], [name] Primary Care [name]: SYSTEM, PRO V IDER Other Clinician: Referring Wrigley Plasencia: Treating Ellise Kovack/Extender: Jadene Pierini in Treatment: 27 Visit Information History Since Last Visit Added or deleted any medications: No Patient Arrived: Wheel Chair Any new allergies or adverse reactions: No Arrival Time: 13:55 Had a fall or experienced change in No Accompanied By: son activities of daily living that may affect Transfer Assistance: None risk of falls: Patient Identification Verified: Yes Signs or symptoms of abuse/neglect since last visito No Patient Requires Transmission-Based Precautions: No Hospitalized since last visit: No Patient Has Alerts: Yes Implantable device outside of the clinic excluding No Patient Alerts: ABI not obtainable cellular tissue based products placed in the center since last visit: Has Dressing in Place as Prescribed: Yes Pain Present Now: Yes Electronic Signature(s) Signed: 08/11/2022 5:27:48 PM By: Dellie Catholic RN Entered By: Dellie Catholic on 08/11/2022 17:02:28 -------------------------------------------------------------------------------- Encounter Discharge Information Details Patient Name: Date of Service: [name] Raring, [name] 08/11/2022 1:15 PM Medical Record Number: [id number] Patient Account Number: [id number] Date of Birth/Sex: Treating RN: Nov 05, 1931 (88 y.o. [name] Primary Care Lashena Signer: SYSTEM, PRO Ave Filter Other Clinician: Referring Judas Mohammad: Treating Calayah Guadarrama/Extender: Jadene Pierini in Treatment: 27 Encounter Discharge Information Items Post Procedure Vitals Discharge Condition: Stable Temperature (F):  98.3 Ambulatory Status: Wheelchair Pulse (bpm): 92 Discharge Destination: Home Respiratory Rate (breaths/min): 18 Transportation: Private Auto Blood Pressure (mmHg): 134/79 Accompanied By: son Schedule Follow-up Appointment: Yes Clinical Summary of Care: Patient Declined Electronic Signature(s) Signed: 08/11/2022 5:27:48 PM By: Dellie Catholic RN Entered By: Dellie Catholic on 08/11/2022 17:16:14 -------------------------------------------------------------------------------- Lower Extremity Assessment Details Patient Name: Date of Service: [name] 08/11/2022 1:15 PM Medical Record Number: [id number] Patient Account Number: [id number] Date of Birth/Sex: Treating RN: 1931/07/01 (88 y.o. [name] Primary Care Kalin Amrhein: SYSTEM, PRO Ave Filter Other Clinician: Referring Lynley Killilea: Treating Jairo Bellew/Extender: Jadene Pierini in Treatment: 27 Edema Assessment Assessed: [Left: No] [Right: No] Edema: [Left: Ye] [Right: s] Calf Left: Right: Point of Measurement: 26 cm From Medial Instep 39 cm Ankle Left: Right: Point of Measurement: 10 cm From Medial Instep 25 cm Electronic Signature(s) Signed: 08/11/2022 5:27:48 PM By: Dellie Catholic RN Entered By: Dellie Catholic on 08/11/2022 17:03:41 -------------------------------------------------------------------------------- Multi Wound Chart Details Patient Name: Date of Service: [name] Gable. 08/11/2022 1:15 PM Medical Record Number: [id number] Patient Account Number: [id number] Date of Birth/Sex: Treating RN: [birth year] (88 y.o. [name] Primary Care Yaritza Leist: SYSTEM, PRO Ave Filter Other Clinician: Referring Paxtyn Wisdom: Treating Jalexus Brett/Extender: Jadene Pierini in Treatment: 27 Photos: [N/A:N/A] Left T Great oe N/A N/A Wound Location: Gradually Appeared N/A N/A Wounding Event: Diabetic Wound/Ulcer of the Lower N/A N/A Primary Etiology: Extremity Arrhythmia, Congestive Heart Failure,  N/A N/A Comorbid History: Hypertension, Type II Diabetes, Gout, Osteoarthritis 09/29/2021 N/A N/A Date Acquired: 63 N/A N/A Weeks of Treatment: Open N/A N/A Wound Status: No N/A N/A Wound Recurrence: 0.1x0.1x0.1 N/A N/A Measurements L x W x D (cm) 0.008 N/A N/A A (cm) : rea 0.001 N/A N/A Volume (cm) : 97.20% N/A N/A % Reduction in Area: 98.20% N/A N/A % Reduction in Volume: Grade 2 N/A N/A Classification: Small N/A N/A Exudate A mount: Serous N/A N/A Exudate Type: amber N/A N/A Exudate Color: Flat and Intact N/A N/A Wound  Margin: Medium (34-66%) N/A N/A Granulation A mount: Pink N/A N/A Granulation Quality: Medium (34-66%) N/A N/A Necrotic A mount: Eschar, Adherent Slough N/A N/A Necrotic Tissue: Fat Layer (Subcutaneous Tissue): Yes N/A N/A Exposed Structures: Bone: Yes Fascia: No Tendon: No Muscle: No Joint: No Small (1-33%) N/A N/A Epithelialization: Debridement - Selective/Open Wound N/A N/A Debridement: Pre-procedure Verification/Time Out 14:15 N/A N/A Taken: Lidocaine 4% T opical Solution N/A N/A Pain Control: Callus, Slough N/A N/A Tissue Debrided: Non-Viable Tissue N/A N/A Level: 0.01 N/A N/A Debridement A (sq cm): rea Curette N/A N/A Instrument: Minimum N/A N/A Bleeding: Pressure N/A N/A Hemostasis A chieved: 0 N/A N/A Procedural Pain: 0 N/A N/A Post Procedural Pain: Procedure was tolerated well N/A N/A Debridement Treatment Response: 0.1x0.1x0.1 N/A N/A Post Debridement Measurements L x W x D (cm) 0.001 N/A N/A Post Debridement Volume: (cm) Debridement N/A N/A Procedures Performed: Treatment Notes Electronic Signature(s) Signed: 08/11/2022 2:32:06 PM By: Fredirick Maudlin MD FACS Signed: 08/11/2022 5:27:48 PM By: Dellie Catholic RN Entered By: Fredirick Maudlin on 08/11/2022 14:32:06 -------------------------------------------------------------------------------- Multi-Disciplinary Care Plan Details Patient Name: Date of  Service: [name] Raring, [name] 08/11/2022 1:15 PM Medical Record Number: [id number] Patient Account Number: [id number] Date of Birth/Sex: Treating RN: [birth year] (88 y.o. [name] Primary Care Emaad Nanna: SYSTEM, PRO Ave Filter Other Clinician: Referring Tiyon Sanor: Treating Rosaleigh Brazzel/Extender: Jadene Pierini in Treatment: 27 Multidisciplinary Care Plan reviewed with physician Active Inactive Nutrition Nursing Diagnoses: Impaired glucose control: actual or potential Potential for alteratiion in Nutrition/Potential for imbalanced nutrition Goals: Patient/caregiver will maintain therapeutic glucose control Date Initiated: 02/01/2022 Target Resolution Date: 08/20/2022 Goal Status: Active Interventions: Assess HgA1c results as ordered upon admission and as needed Assess patient nutrition upon admission and as needed per policy Provide education on elevated blood sugars and impact on wound healing Provide education on nutrition Treatment Activities: Education provided on Nutrition : 02/01/2022 Notes: Wound/Skin Impairment Nursing Diagnoses: Impaired tissue integrity Knowledge deficit related to ulceration/compromised skin integrity Goals: Patient/caregiver will verbalize understanding of skin care regimen Date Initiated: 02/01/2022 Date Inactivated: 05/18/2022 Target Resolution Date: 04/28/2022 Goal Status: Met Ulcer/skin breakdown will have a volume reduction of 30% by week 4 Date Initiated: 02/01/2022 Date Inactivated: 03/24/2022 Target Resolution Date: 03/31/2022 Goal Status: Unmet Unmet Reason: PAD Ulcer/skin breakdown will have a volume reduction of 50% by week 8 Date Initiated: 04/06/2022 Target Resolution Date: 08/20/2022 Goal Status: Active Interventions: Assess patient/caregiver ability to obtain necessary supplies Assess patient/caregiver ability to perform ulcer/skin care regimen upon admission and as needed Assess ulceration(s) every visit Provide education on  ulcer and skin care Notes: Electronic Signature(s) Signed: 08/11/2022 5:27:48 PM By: Dellie Catholic RN Entered By: Dellie Catholic on 08/11/2022 17:14:56 -------------------------------------------------------------------------------- Pain Assessment Details Patient Name: Date of Service: FATMATA, LEGERE 08/11/2022 1:15 PM Medical Record Number: [id number] Patient Account Number: [id number] Date of Birth/Sex: Treating RN: November 05, 1931 (88 y.o. [name] Primary Care Dorlisa Savino: SYSTEM, PRO Ave Filter Other Clinician: Referring Kizzi Overbey: Treating Tamyra Fojtik/Extender: Jadene Pierini in Treatment: 27 Active Problems Location of Pain Severity and Description of Pain Patient Has Paino Yes Site Locations Pain Location: Generalized Pain With Dressing Change: Yes Duration of the Pain. Constant / Intermittento Constant Rate the pain. Current Pain Level: 8 Worst Pain Level: 10 Least Pain Level: 4 Tolerable Pain Level: 5 Character of Pain Describe the Pain: Difficult to Pinpoint Pain Management and Medication Current Pain Management: Medication: Yes Cold Application: No Rest: Yes Massage: No Activity: No T.E.N.S.: No Heat Application: No Leg drop or elevation:  No Is the Current Pain Management Adequate: Adequate How does your wound impact your activities of daily livingo Sleep: No Bathing: No Appetite: No Relationship With Others: No Bladder Continence: No Emotions: No Bowel Continence: No Work: No Toileting: No Drive: No Dressing: No Hobbies: No Electronic Signature(s) Signed: 08/11/2022 5:27:48 PM By: Dellie Catholic RN Entered By: Dellie Catholic on 08/11/2022 17:03:32 -------------------------------------------------------------------------------- Patient/Caregiver Education Details Patient Name: Date of Service: [name] Gable 9/21/2023andnbsp1:15 PM Medical Record Number: [id number] Patient Account Number: [id number] Date of  Birth/Gender: Treating RN: [birth year] (88 y.o. [name] Primary Care Physician: SYSTEM, PRO V IDER Other Clinician: Referring Physician: Treating Physician/Extender: Jadene Pierini in Treatment: 27 Education Assessment Education Provided To: Patient Education Topics Provided Wound/Skin Impairment: Methods: Explain/Verbal Responses: Return demonstration correctly Electronic Signature(s) Signed: 08/11/2022 5:27:48 PM By: Dellie Catholic RN Entered By: Dellie Catholic on 08/11/2022 17:15:09 -------------------------------------------------------------------------------- Wound Assessment Details Patient Name: Date of Service: [name] Gable 08/11/2022 1:15 PM Medical Record Number: [id number] Patient Account Number: [id number] Date of Birth/Sex: Treating RN: 1931-10-08 (88 y.o. [name] Primary Care Norell Brisbin: SYSTEM, PRO V IDER Other Clinician: Referring Baljit Liebert: Treating Dorothie Wah/Extender: Jadene Pierini in Treatment: 27 Wound Status Wound Number: 1 Primary Diabetic Wound/Ulcer of the Lower Extremity Etiology: Wound Location: Left T Great oe Wound Open Wounding Event: Gradually Appeared Status: Date Acquired: 09/29/2021 Comorbid Arrhythmia, Congestive Heart Failure, Hypertension, Type II Weeks Of Treatment: 27 History: Diabetes, Gout, Osteoarthritis Clustered Wound: No Photos Wound Measurements Length: (cm) 0.1 Width: (cm) 0.1 Depth: (cm) 0.1 Area: (cm) 0.008 Volume: (cm) 0.001 % Reduction in Area: 97.2% % Reduction in Volume: 98.2% Epithelialization: Small (1-33%) Tunneling: No Undermining: No Wound Description Classification: Grade 2 Wound Margin: Flat and Intact Exudate Amount: Small Exudate Type: Serous Exudate Color: amber Foul Odor After Cleansing: No Slough/Fibrino Yes Wound Bed Granulation Amount: Medium (34-66%) Exposed Structure Granulation Quality: Pink Fascia Exposed: No Necrotic Amount: Medium  (34-66%) Fat Layer (Subcutaneous Tissue) Exposed: Yes Necrotic Quality: Eschar, Adherent Slough Tendon Exposed: No Muscle Exposed: No Joint Exposed: No Bone Exposed: Yes Treatment Notes Wound #1 (Toe Great) Wound Laterality: Left Cleanser Wound Cleanser Discharge Instruction: Cleanse the wound with wound cleanser prior to applying a clean dressing using gauze sponges, not tissue or cotton balls. Peri-Wound Care Topical Gentamicin Discharge Instruction: As directed by physician Primary Dressing KerraCel Ag Gelling Fiber Dressing, 2x2 in (silver alginate) Discharge Instruction: Apply silver alginate to wound bed as instructed Optifoam Non-Adhesive Dressing, 4x4 in Discharge Instruction: Apply to Left Great toe to protect toe Pierson Discharge Instruction: On hold 08/11/22 Secondary Dressing Woven Gauze Sponges 2x2 in Discharge Instruction: Apply over primary dressing as directed. Secured With Conforming Stretch Gauze Bandage, Sterile 2x75 (in/in) Discharge Instruction: Secure with stretch gauze as directed. Paper Tape, 1x10 (in/yd) Discharge Instruction: Secure dressing with tape as directed. Compression Wrap Compression Stockings Add-Ons Electronic Signature(s) Signed: 08/11/2022 5:27:48 PM By: Dellie Catholic RN Entered By: Dellie Catholic on 08/11/2022 14:11:31 -------------------------------------------------------------------------------- Vitals Details Patient Name: Date of Service: [name] Raring, [name] 08/11/2022 1:15 PM Medical Record Number: [id number] Patient Account Number: [id number] Date of Birth/Sex: Treating RN: January 08, 1931 (88 y.o. [name] Primary Care Kinan Safley: SYSTEM, PRO V IDER Other Clinician: Referring Tiara Bartoli: Treating Konstance Happel/Extender: Jadene Pierini in Treatment: 27 Vital Signs Time Taken: 14:00 Temperature (F): 98.8 Pulse (bpm): 106 Respiratory Rate (breaths/min): 20 Blood Pressure (mmHg): 120/76 Reference Range: 80 - 120  mg / dl Electronic Signature(s) Signed: 08/11/2022 5:27:48 PM By: Dellie Catholic RN Entered  By: Dellie Catholic on 08/11/2022 17:02:48

## 2022-08-18 ENCOUNTER — Encounter (HOSPITAL_BASED_OUTPATIENT_CLINIC_OR_DEPARTMENT_OTHER): Payer: Medicare Other | Admitting: General Surgery

## 2022-08-18 DIAGNOSIS — E11621 Type 2 diabetes mellitus with foot ulcer: Secondary | ICD-10-CM | POA: Diagnosis not present

## 2022-08-18 NOTE — Progress Notes (Signed)
[name], RIVKIN ([id number]) Visit Report for 08/18/2022 Arrival Information Details Patient Name: Date of Service: [name], [name] 08/18/2022 12:30 PM Medical Record Number: [id number] Patient Account Number: [id number] Date of Birth/Sex: Treating RN: May 31, 1931 (88 y.o. America Colombo Primary Care Ellison Leisure: SYSTEM, PRO V IDER Other Clinician: Referring Gavina Dildine: Treating Liya Strollo/Extender: Jadene Pierini in Treatment: 28 Visit Information History Since Last Visit Added or deleted any medications: No Patient Arrived: Wheel Chair Any new allergies or adverse reactions: No Arrival Time: 12:45 Had a fall or experienced change in No Accompanied By: son activities of daily living that may affect Transfer Assistance: None risk of falls: Patient Requires Transmission-Based Precautions: No Signs or symptoms of abuse/neglect since last visito No Patient Has Alerts: Yes Hospitalized since last visit: No Patient Alerts: ABI not obtainable Implantable device outside of the clinic excluding No cellular tissue based products placed in the center since last visit: Has Dressing in Place as Prescribed: Yes Pain Present Now: No Electronic Signature(s) Signed: 08/18/2022 5:31:20 PM By: Dellie Catholic RN Entered By: Dellie Catholic on 08/18/2022 12:59:50 -------------------------------------------------------------------------------- Encounter Discharge Information Details Patient Name: Date of Service: [name], [name] 08/18/2022 12:30 PM Medical Record Number: [id number] Patient Account Number: [id number] Date of Birth/Sex: Treating RN: 01/04/1931 (88 y.o. America Franssen Primary Care Londynn Sonoda: SYSTEM, PRO Ave Filter Other Clinician: Referring Selby Slovacek: Treating Xoie Kreuser/Extender: Jadene Pierini in Treatment: 28 Encounter Discharge Information Items Post Procedure Vitals Discharge Condition: Stable Temperature (F): 98.7 Ambulatory Status: Wheelchair Pulse (bpm):  83 Discharge Destination: Home Respiratory Rate (breaths/min): 16 Transportation: Private Auto Blood Pressure (mmHg): 133/71 Accompanied By: son Schedule Follow-up Appointment: Yes Clinical Summary of Care: Patient Declined Electronic Signature(s) Signed: 08/18/2022 5:31:20 PM By: Dellie Catholic RN Entered By: Dellie Catholic on 08/18/2022 17:26:11 -------------------------------------------------------------------------------- Lower Extremity Assessment Details Patient Name: Date of Service: [name], [name] 08/18/2022 12:30 PM Medical Record Number: [id number] Patient Account Number: [id number] Date of Birth/Sex: Treating RN: 11-24-1930 (88 y.o. America Nesler Primary Care Nisha Dhami: SYSTEM, PRO Ave Filter Other Clinician: Referring Juliahna Wiswell: Treating Phuc Kluttz/Extender: Jadene Pierini in Treatment: 28 Edema Assessment Assessed: [Left: No] [Right: No] Edema: [Left: Ye] [Right: s] Calf Left: Right: Point of Measurement: 26 cm From Medial Instep 39 cm Ankle Left: Right: Point of Measurement: 10 cm From Medial Instep 25 cm Vascular Assessment Pulses: Dorsalis Pedis Palpable: [Left:Yes Yes] Electronic Signature(s) Signed: 08/18/2022 5:31:20 PM By: Dellie Catholic RN Entered By: Dellie Catholic on 08/18/2022 13:01:22 -------------------------------------------------------------------------------- Multi Wound Chart Details Patient Name: Date of Service: [name]. 08/18/2022 12:30 PM Medical Record Number: [id number] Patient Account Number: [id number] Date of Birth/Sex: Treating RN: 15-Dec-1930 (88 y.o. F) Primary Care Vasiliy Mccarry: SYSTEM, PRO V IDER Other Clinician: Referring Arissa Fagin: Treating Jasiah Buntin/Extender: Jadene Pierini in Treatment: 28 Vital Signs Height(in): Pulse(bpm): 47 Weight(lbs): Blood Pressure(mmHg): 133/71 Body Mass Index(BMI): Temperature(F): 98.7 Respiratory Rate(breaths/min): 16 Photos: [1:Left T Great oe] [N/A:N/A  N/A] Wound Location: [1:Gradually Appeared] [N/A:N/A] Wounding Event: [1:Diabetic Wound/Ulcer of the Lower] [N/A:N/A] Primary Etiology: [1:Extremity Arrhythmia, Congestive Heart Failure, N/A] Comorbid History: [1:Hypertension, Type II Diabetes, Gout, Osteoarthritis 09/29/2021] [N/A:N/A] Date Acquired: [1:28] [N/A:N/A] Weeks of Treatment: [1:Open] [N/A:N/A] Wound Status: [1:No] [N/A:N/A] Wound Recurrence: [1:0.1x0.1x0.1] [N/A:N/A] Measurements L x W x D (cm) [1:0.008] [N/A:N/A] A (cm) : rea [1:0.001] [N/A:N/A] Volume (cm) : [1:97.20%] [N/A:N/A] % Reduction in A [1:rea: 98.20%] [N/A:N/A] % Reduction in Volume: [1:Grade 2] [N/A:N/A] Classification: [1:Small] [N/A:N/A] Exudate A mount: [1:Serous] [N/A:N/A] Exudate Type: [1:amber] [N/A:N/A] Exudate Color: [1:Flat and Intact] [  N/A:N/A] Wound Margin: [1:Medium (34-66%)] [N/A:N/A] Granulation A mount: [1:Pink] [N/A:N/A] Granulation Quality: [1:Medium (34-66%)] [N/A:N/A] Necrotic A mount: [1:Eschar, Adherent Slough] [N/A:N/A] Necrotic Tissue: [1:Fat Layer (Subcutaneous Tissue): Yes N/A] Exposed Structures: [1:Fascia: No Tendon: No Muscle: No Joint: No Bone: No Small (1-33%)] [N/A:N/A] Epithelialization: [1:Debridement - Selective/Open Wound N/A] Debridement: Pre-procedure Verification/Time Out 13:00 [N/A:N/A] Taken: [1:Lidocaine 4% T opical Solution] [N/A:N/A] Pain Control: [1:Callus, Slough] [N/A:N/A] Tissue Debrided: [1:Non-Viable Tissue] [N/A:N/A] Level: [1:0.01] [N/A:N/A] Debridement A (sq cm): [1:rea Curette] [N/A:N/A] Instrument: [1:Minimum] [N/A:N/A] Bleeding: [1:Pressure] [N/A:N/A] Hemostasis A chieved: [1:0] [N/A:N/A] Procedural Pain: [1:0] [N/A:N/A] Post Procedural Pain: [1:Procedure was tolerated well] [N/A:N/A] Debridement Treatment Response: [1:0.1x0.1x0.1] [N/A:N/A] Post Debridement Measurements L x W x D (cm) [1:0.001] [N/A:N/A] Post Debridement Volume: (cm) [1:Debridement] [N/A:N/A] Treatment Notes Electronic  Signature(s) Signed: 08/18/2022 1:05:13 PM By: Fredirick Maudlin MD FACS Entered By: Fredirick Maudlin on 08/18/2022 13:05:12 -------------------------------------------------------------------------------- Multi-Disciplinary Care Plan Details Patient Name: Date of Service: [name]. 08/18/2022 12:30 PM Medical Record Number: [id number] Patient Account Number: [id number] Date of Birth/Sex: Treating RN: [birth year] (88 y.o. America Graley Primary Care Claudis Giovanelli: SYSTEM, PRO Ave Filter Other Clinician: Referring Juell Radney: Treating Bernie Ransford/Extender: Jadene Pierini in Treatment: 28 Multidisciplinary Care Plan reviewed with physician Active Inactive Nutrition Nursing Diagnoses: Impaired glucose control: actual or potential Potential for alteratiion in Nutrition/Potential for imbalanced nutrition Goals: Patient/caregiver will maintain therapeutic glucose control Date Initiated: 02/01/2022 Target Resolution Date: 10/20/2022 Goal Status: Active Interventions: Assess HgA1c results as ordered upon admission and as needed Assess patient nutrition upon admission and as needed per policy Provide education on elevated blood sugars and impact on wound healing Provide education on nutrition Treatment Activities: Education provided on Nutrition : 02/01/2022 Notes: Wound/Skin Impairment Nursing Diagnoses: Impaired tissue integrity Knowledge deficit related to ulceration/compromised skin integrity Goals: Patient/caregiver will verbalize understanding of skin care regimen Date Initiated: 02/01/2022 Date Inactivated: 05/18/2022 Target Resolution Date: 04/28/2022 Goal Status: Met Ulcer/skin breakdown will have a volume reduction of 30% by week 4 Date Initiated: 02/01/2022 Date Inactivated: 03/24/2022 Target Resolution Date: 03/31/2022 Goal Status: Unmet Unmet Reason: PAD Ulcer/skin breakdown will have a volume reduction of 50% by week 8 Date Initiated: 04/06/2022 Target Resolution  Date: 10/20/2022 Goal Status: Active Interventions: Assess patient/caregiver ability to obtain necessary supplies Assess patient/caregiver ability to perform ulcer/skin care regimen upon admission and as needed Assess ulceration(s) every visit Provide education on ulcer and skin care Notes: Electronic Signature(s) Signed: 08/18/2022 5:31:20 PM By: Dellie Catholic RN Entered By: Dellie Catholic on 08/18/2022 17:24:58 -------------------------------------------------------------------------------- Pain Assessment Details Patient Name: Date of Service: [name]. 08/18/2022 12:30 PM Medical Record Number: [id number] Patient Account Number: [id number] Date of Birth/Sex: Treating RN: March 20, 1931 (88 y.o. America Nanni Primary Care Luay Balding: SYSTEM, PRO Ave Filter Other Clinician: Referring Namira Rosekrans: Treating Nautika Cressey/Extender: Jadene Pierini in Treatment: 28 Active Problems Location of Pain Severity and Description of Pain Patient Has Paino No Site Locations Pain Management and Medication Current Pain Management: Electronic Signature(s) Signed: 08/18/2022 5:31:20 PM By: Dellie Catholic RN Entered By: Dellie Catholic on 08/18/2022 13:01:04 -------------------------------------------------------------------------------- Patient/Caregiver Education Details Patient Name: Date of Service: [name] 9/28/2023andnbsp12:30 PM Medical Record Number: [id number] Patient Account Number: [id number] Date of Birth/Gender: Treating RN: 1931-05-30 (88 y.o. America Hino Primary Care Physician: SYSTEM, PRO V IDER Other Clinician: Referring Physician: Treating Physician/Extender: Jadene Pierini in Treatment: 28 Education Assessment Education Provided To: Patient Education Topics Provided Wound Debridement: Methods: Explain/Verbal Responses: Return demonstration correctly Electronic Signature(s) Signed: 08/18/2022 5:31:20 PM  By: Dellie Catholic  RN Entered By: Dellie Catholic on 08/18/2022 17:25:11 -------------------------------------------------------------------------------- Wound Assessment Details Patient Name: Date of Service: NOLAN, LASSER 08/18/2022 12:30 PM Medical Record Number: [id number] Patient Account Number: [id number] Date of Birth/Sex: Treating RN: June 21, 1931 (88 y.o. America Stogdill Primary Care Arma Reining: SYSTEM, PRO V IDER Other Clinician: Referring Sampson Self: Treating Cienna Dumais/Extender: Jadene Pierini in Treatment: 28 Wound Status Wound Number: 1 Primary Diabetic Wound/Ulcer of the Lower Extremity Etiology: Wound Location: Left T Great oe Wound Open Wounding Event: Gradually Appeared Status: Date Acquired: 09/29/2021 Comorbid Arrhythmia, Congestive Heart Failure, Hypertension, Type II Weeks Of Treatment: 28 History: Diabetes, Gout, Osteoarthritis Clustered Wound: No Photos Wound Measurements Length: (cm) 0.1 Width: (cm) 0.1 Depth: (cm) 0.1 Area: (cm) 0.008 Volume: (cm) 0.001 % Reduction in Area: 97.2% % Reduction in Volume: 98.2% Epithelialization: Small (1-33%) Tunneling: No Undermining: No Wound Description Classification: Grade 2 Wound Margin: Flat and Intact Exudate Amount: Small Exudate Type: Serous Exudate Color: amber Foul Odor After Cleansing: No Slough/Fibrino Yes Wound Bed Granulation Amount: Medium (34-66%) Exposed Structure Granulation Quality: Pink Fascia Exposed: No Necrotic Amount: Medium (34-66%) Fat Layer (Subcutaneous Tissue) Exposed: Yes Necrotic Quality: Eschar, Adherent Slough Tendon Exposed: No Muscle Exposed: No Joint Exposed: No Bone Exposed: No Treatment Notes Wound #1 (Toe Great) Wound Laterality: Left Cleanser Wound Cleanser Discharge Instruction: Cleanse the wound with wound cleanser prior to applying a clean dressing using gauze sponges, not tissue or cotton balls. Peri-Wound Care Topical Ketoconazole Cream 2% Discharge  Instruction: Apply Ketoconazole as directed Primary Dressing KerraCel Ag Gelling Fiber Dressing, 2x2 in (silver alginate) Discharge Instruction: Apply silver alginate to wound bed as instructed Optifoam Non-Adhesive Dressing, 4x4 in Discharge Instruction: Apply to Left Great toe to protect toe Franklin Discharge Instruction: On hold 08/18/22 Secondary Dressing Woven Gauze Sponges 2x2 in Discharge Instruction: Apply over primary dressing as directed. Secured With Conforming Stretch Gauze Bandage, Sterile 2x75 (in/in) Discharge Instruction: Secure with stretch gauze as directed. Paper Tape, 1x10 (in/yd) Discharge Instruction: Secure dressing with tape as directed. Compression Wrap Compression Stockings Add-Ons Electronic Signature(s) Signed: 08/18/2022 5:31:20 PM By: Dellie Catholic RN Entered By: Dellie Catholic on 08/18/2022 12:53:06 -------------------------------------------------------------------------------- Vitals Details Patient Name: Date of Service: [name], [name] 08/18/2022 12:30 PM Medical Record Number: [id number] Patient Account Number: [id number] Date of Birth/Sex: Treating RN: [birth year] (88 y.o. America Nunn Primary Care Hershey Knauer: SYSTEM, PRO V IDER Other Clinician: Referring Chai Routh: Treating Zaryia Markel/Extender: Jadene Pierini in Treatment: 28 Vital Signs Time Taken: 12:45 Temperature (F): 98.7 Pulse (bpm): 83 Respiratory Rate (breaths/min): 16 Blood Pressure (mmHg): 133/71 Reference Range: 80 - 120 mg / dl Electronic Signature(s) Signed: 08/18/2022 5:31:20 PM By: Dellie Catholic RN Entered By: Dellie Catholic on 08/18/2022 13:00:53

## 2022-08-18 NOTE — Progress Notes (Signed)
VIRDELL, HOILAND ([id number]) Visit Report for 08/18/2022 Chief Complaint Document Details Patient Name: Date of Service: [name], [name] 08/18/2022 12:30 PM Medical Record Number: [id number] Patient Account Number: [id number] Date of Birth/Sex: Treating RN: 1931-05-28 (88 y.o. F) Primary Care Provider: SYSTEM, PRO V IDER Other Clinician: Referring Provider: Treating Provider/Extender: [name] in Treatment: 28 Information Obtained from: Patient Chief Complaint Patient presents to the wound care center with open non-healing surgical wound(s) Electronic Signature(s) Signed: 08/18/2022 1:06:38 PM By: Duanne Guess MD FACS Entered By: Duanne Guess on 08/18/2022 13:06:38 -------------------------------------------------------------------------------- Debridement Details Patient Name: Date of Service: [name]. 08/18/2022 12:30 PM Medical Record Number: [id number] Patient Account Number: [id number] Date of Birth/Sex: Treating RN: 1931-10-21 (88 y.o. [name] Primary Care Provider: SYSTEM, PRO V IDER Other Clinician: Referring Provider: Treating Provider/Extender: [name] in Treatment: 28 Debridement Performed for Assessment: Wound #1 Left T Great oe Performed By: Physician Duanne Guess, MD Debridement Type: Debridement Severity of Tissue Pre Debridement: Fat layer exposed Level of Consciousness (Pre-procedure): Awake and Alert Pre-procedure Verification/Time Out Yes - 13:00 Taken: Start Time: 13:00 Pain Control: Lidocaine 4% T opical Solution T Area Debrided (L x W): otal 0.1 (cm) x 0.1 (cm) = 0.01 (cm) Tissue and other material debrided: Non-Viable, Callus, Slough, Slough Level: Non-Viable Tissue Debridement Description: Selective/Open Wound Instrument: Curette Bleeding: Minimum Hemostasis Achieved: Pressure End Time: 13:01 Procedural Pain: 0 Post Procedural Pain: 0 Response to Treatment: Procedure was tolerated  well Level of Consciousness (Post- Awake and Alert procedure): Post Debridement Measurements of Total Wound Length: (cm) 0.1 Width: (cm) 0.1 Depth: (cm) 0.1 Volume: (cm) 0.001 Character of Wound/Ulcer Post Debridement: Improved Severity of Tissue Post Debridement: Fat layer exposed Post Procedure Diagnosis Same as Pre-procedure Notes Scribed for Dr. Lady Gary by J.Scotton Electronic Signature(s) Signed: 08/18/2022 2:53:26 PM By: Duanne Guess MD FACS Signed: 08/18/2022 5:31:20 PM By: Karie Schwalbe RN Entered By: Karie Schwalbe on 08/18/2022 13:03:19 -------------------------------------------------------------------------------- HPI Details Patient Name: Date of Service: [name], [name]. 08/18/2022 12:30 PM Medical Record Number: [id number] Patient Account Number: [id number] Date of Birth/Sex: Treating RN: [birth year] (88 y.o. F) Primary Care Provider: SYSTEM, PRO V IDER Other Clinician: Referring Provider: Treating Provider/Extender: [name] in Treatment: 28 History of Present Illness HPI Description: ADMISSION 02/01/2022 This is an 88 year old woman who is a resident of [place]. She has a past medical history notable for type 2 diabetes mellitus, congestive heart failure, pulmonary hypertension, and complete heart block. She is accompanied today by her son. Approximately 4 months ago, she had an ingrown toe nail removed. Apparently this went a bit deep. Since that time, it has not healed. The patient is nonambulatory. Apparently they have been applying Santyl to the site at her facility. She was on antibiotics but I am not certain which specific drugs she took. She completed her last course a few weeks ago. We were unable to obtain ABIs in clinic secondary to the patient's body habitus. She has not had any formal vascular studies nor any x-ray of the area. 02/09/2022: The wound is roughly the same size today, but the amount of slough in the base has decreased  significantly. She apparently had an x-ray done at her facility, but we do not have the report; per verbal report from facility staff, "there was nothing there." We had requested formal ABI/TBI studies, but these were not done. She has been in [place] under [facility]	[place]. 02/16/2022: The wound measured about the same today, but  to my eye, it appears rather smaller. The slough in the base is minimal. Her vascular studies are scheduled for Friday. We have been using Santyl under Hydrofera Blue. No concern for infection. 02/23/2022: Once again, the wound has been measured about the same size, but I think it looks a bit smaller. There is slough at the base. She had her ABIs done last week and these show severe bilateral peripheral arterial disease. We received the report of the foot x-ray that was ordered. There is no concern for osteomyelitis at this time. 03/02/2022: The wound is smaller today with a small amount of slough at the base. We placed a referral to vein and vascular surgery last week in response to her severe peripheral arterial disease, but she does not yet have an appointment on the books. 03/16/2022: She saw Dr. Lenell Antu in the vascular surgery department yesterday. He did not think she was a good candidate for any revascularization options and suggested that she continue local wound care for another month. If she fails to heal in that time, they may consider an angiogram with potential intervention. The wound today is roughly the same size but there is less exposed bone at the base and the surface is clean without any substantial slough. 03/24/2022: The wound appears about the same. There is some periwound callus that has heaped up. We changed her to Prisma silver collagen last week. 04/06/2022: No significant change to the wound. It is neither improving nor deteriorating. She continues to accumulate some periwound callus around the margin. Bone is exposed at the base. 04/20/2022: The wound has  improved quite a bit over the past 2 weeks. It is smaller and there is no real accumulation of periwound callus. She had follow-up with Dr. Lenell Antu yesterday and he felt that the wound was progressing well enough that no arterial intervention was necessary. 05/18/2022: The patient has been absent due to a fall from her wheelchair during her last transportation that was supposed to be to her clinic. Today, the wound is about the same size. There is still bone exposed at the base. There is periwound callus accumulation. No concern for infection. 06/01/2022: The wound is actually a little bit smaller today and the surface is not as pale. She still has accumulated periwound callus. 06/15/2022: The wound continues to contract, albeit slowly. No concern for infection. 06/28/2022: The wound is slightly smaller again today. She continues to accumulate callus and there is some periwound epibole at the margins. No concern for gross infection. 07/11/2022: The wound is roughly the same size with just a small opening in the center. She has been approved for Grafix. 07/18/2022: The wound has contracted somewhat and the surface has a healthier pink look to it. We applied Grafix last week and she is here for application #2. 07/28/2022: The wound is about the same size but the surface continues to improve and appear more vital. She is here for Grafix application #3. 08/04/2022: There is still a stubborn central portion of her toe wound that is not closing in yet. The bone remains exposed. She is here for Grafix application #4. 08/11/2022: Today when I debrided the eschar and callus from her wound, purulent drainage was expressed from her wound. The underlying surface is unchanged with bone exposed. She has been having more pain in the toe and this may explain why. 08/18/2022: The culture that I took last week grew out Candida and Acinetobacter. We changed her dressing regimen to terbinafine instead of gentamicin and  I prescribed  levofloxacin instead of Augmentin. T oday, the wound does not look that much different. It remains small with granulation tissue at the base, but bone remains exposed. Electronic Signature(s) Signed: 08/18/2022 1:07:37 PM By: Fredirick Maudlin MD FACS Entered By: Fredirick Maudlin on 08/18/2022 13:07:36 -------------------------------------------------------------------------------- Physical Exam Details Patient Name: Date of Service: [name] Gable. 08/18/2022 12:30 PM Medical Record Number: [id number] Patient Account Number: [id number] Date of Birth/Sex: Treating RN: Jun 30, 1931 (88 y.o. F) Primary Care Provider: SYSTEM, PRO V IDER Other Clinician: Referring Provider: Treating Provider/Extender: Jadene Pierini in Treatment: 28 Constitutional . . . . No acute distress.[name] Respiratory Normal work of breathing on room air.. Notes 08/18/2022: Today, the wound does not look that much different. It remains small with granulation tissue at the base, but bone remains exposed. Electronic Signature(s) Signed: 08/18/2022 1:08:20 PM By: Fredirick Maudlin MD FACS Entered By: Fredirick Maudlin on 08/18/2022 13:08:20 -------------------------------------------------------------------------------- Physician Orders Details Patient Name: Date of Service: [name] Gable. 08/18/2022 12:30 PM Medical Record Number: [id number] Patient Account Number: [id number] Date of Birth/Sex: Treating RN: [birth year] (88 y.o. [place] Primary Care Provider: SYSTEM, PRO V IDER Other Clinician: Referring Provider: Treating Provider/Extender: Jadene Pierini in Treatment: 28 Verbal / Phone Orders: No Diagnosis Coding ICD-10 Coding Code Description E11.621 Type 2 diabetes mellitus with foot ulcer L97.522 Non-pressure chronic ulcer of other part of left foot with fat layer exposed Z16.96 Chronic diastolic (congestive) heart failure I27.20 Pulmonary hypertension, unspecified Follow-up  Appointments ppointment in 2 weeks. - Dr. Celine Ahr Room 3 Thursday 12th October at 12:30PM Return A Other: - Grafix #4 (08/04/22)Grafix on hold (08/11/22) Anesthetic (In clinic) Topical Lidocaine 4% applied to wound bed - In Clinic Cellular or Tissue Based Products Wound #1 Left T Great oe daptic or Mepitel. (DO NOT REMOVE). - *** Cellular or Tissue Based Product applied to wound bed, secured with steri-strips, cover with A Do not remove dressing for a week**** Grafix PL PRIME 79mm (Disc) Bathing/ Shower/ Hygiene May shower with protection but do not get wound dressing(s) wet. - Do Not Left Great T wet**** Skin Substitute (Grafix #4) applied 08/04/22**** oe GRAFIX ON HOLD 08/11/22 Off-Loading Other: - No direct pressure on toes Wound Treatment Wound #1 - T Great oe Wound Laterality: Left Cleanser: Wound Cleanser 1 x Per Day/30 Days Discharge Instructions: Cleanse the wound with wound cleanser prior to applying a clean dressing using gauze sponges, not tissue or cotton balls. Topical: Ketoconazole Cream 2% 1 x Per Day/30 Days Discharge Instructions: Apply Ketoconazole as directed Prim Dressing: KerraCel Ag Gelling Fiber Dressing, 2x2 in (silver alginate) 1 x Per Day/30 Days ary Discharge Instructions: Apply silver alginate to wound bed as instructed Prim Dressing: Optifoam Non-Adhesive Dressing, 4x4 in 1 x Per Day/30 Days ary Discharge Instructions: Apply to Left Great toe to protect toe Prim Dressing: GRAFIX 1 x Per Day/30 Days ary Discharge Instructions: On hold 08/18/22 Secondary Dressing: Woven Gauze Sponges 2x2 in 1 x Per Day/30 Days Discharge Instructions: Apply over primary dressing as directed. Secured With: Child psychotherapist, Sterile 2x75 (in/in) 1 x Per Day/30 Days Discharge Instructions: Secure with stretch gauze as directed. Secured With: Paper Tape, 1x10 (in/yd) 1 x Per Day/30 Days Discharge Instructions: Secure dressing with tape as directed. Electronic  Signature(s) Signed: 08/18/2022 2:53:26 PM By: Fredirick Maudlin MD FACS Entered By: Fredirick Maudlin on 08/18/2022 13:08:34 -------------------------------------------------------------------------------- Problem List Details Patient Name: Date of Service: [name], [name] Camps. 08/18/2022 12:30 PM Medical Record Number:  [id number] Patient Account Number: [id number] Date of Birth/Sex: Treating RN: 07-07-1931 (88 y.o. F) Primary Care Provider: SYSTEM, PRO V IDER Other Clinician: Referring Provider: Treating Provider/Extender: [name] in Treatment: 28 Active Problems ICD-10 Encounter Code Description Active Date MDM Diagnosis E11.621 Type 2 diabetes mellitus with foot ulcer 02/01/2022 No Yes L97.522 Non-pressure chronic ulcer of other part of left foot with fat layer exposed 02/01/2022 No Yes I50.32 Chronic diastolic (congestive) heart failure 02/01/2022 No Yes I27.20 Pulmonary hypertension, unspecified 02/01/2022 No Yes Inactive Problems Resolved Problems Electronic Signature(s) Signed: 08/18/2022 1:05:05 PM By: Duanne Guess MD FACS Entered By: Duanne Guess on 08/18/2022 13:05:05 -------------------------------------------------------------------------------- Progress Note Details Patient Name: Date of Service: [name]. 08/18/2022 12:30 PM Medical Record Number: [id number] Patient Account Number: [id number] Date of Birth/Sex: Treating RN: 04-06-1931 (88 y.o. F) Primary Care Provider: SYSTEM, PRO V IDER Other Clinician: Referring Provider: Treating Provider/Extender: [name] in Treatment: 28 Subjective Chief Complaint Information obtained from Patient Patient presents to the wound care center with open non-healing surgical wound(s) History of Present Illness (HPI) ADMISSION 02/01/2022 This is an 88 year old woman who is a resident of [place]. She has a past medical history notable for type 2 diabetes mellitus, congestive heart  failure, pulmonary hypertension, and complete heart block. She is accompanied today by her son. Approximately 4 months ago, she had an ingrown toe nail removed. Apparently this went a bit deep. Since that time, it has not healed. The patient is nonambulatory. Apparently they have been applying Santyl to the site at her facility. She was on antibiotics but I am not certain which specific drugs she took. She completed her last course a few weeks ago. We were unable to obtain ABIs in clinic secondary to the patient's body habitus. She has not had any formal vascular studies nor any x-ray of the area. 02/09/2022: The wound is roughly the same size today, but the amount of slough in the base has decreased significantly. She apparently had an x-ray done at her facility, but we do not have the report; per verbal report from facility staff, "there was nothing there." We had requested formal ABI/TBI studies, but these were not done. She has been in [place] under [facility]	[place]. 02/16/2022: The wound measured about the same today, but to my eye, it appears rather smaller. The slough in the base is minimal. Her vascular studies are scheduled for Friday. We have been using Santyl under Hydrofera Blue. No concern for infection. 02/23/2022: Once again, the wound has been measured about the same size, but I think it looks a bit smaller. There is slough at the base. She had her ABIs done last week and these show severe bilateral peripheral arterial disease. We received the report of the foot x-ray that was ordered. There is no concern for osteomyelitis at this time. 03/02/2022: The wound is smaller today with a small amount of slough at the base. We placed a referral to vein and vascular surgery last week in response to her severe peripheral arterial disease, but she does not yet have an appointment on the books. 03/16/2022: She saw Dr. Lenell Antu in the vascular surgery department yesterday. He did not think she was a good  candidate for any revascularization options and suggested that she continue local wound care for another month. If she fails to heal in that time, they may consider an angiogram with potential intervention. The wound today is roughly the same size but there is less exposed bone at the  base and the surface is clean without any substantial slough. 03/24/2022: The wound appears about the same. There is some periwound callus that has heaped up. We changed her to Prisma silver collagen last week. 04/06/2022: No significant change to the wound. It is neither improving nor deteriorating. She continues to accumulate some periwound callus around the margin. Bone is exposed at the base. 04/20/2022: The wound has improved quite a bit over the past 2 weeks. It is smaller and there is no real accumulation of periwound callus. She had follow-up with Dr. Lenell Antu yesterday and he felt that the wound was progressing well enough that no arterial intervention was necessary. 05/18/2022: The patient has been absent due to a fall from her wheelchair during her last transportation that was supposed to be to her clinic. Today, the wound is about the same size. There is still bone exposed at the base. There is periwound callus accumulation. No concern for infection. 06/01/2022: The wound is actually a little bit smaller today and the surface is not as pale. She still has accumulated periwound callus. 06/15/2022: The wound continues to contract, albeit slowly. No concern for infection. 06/28/2022: The wound is slightly smaller again today. She continues to accumulate callus and there is some periwound epibole at the margins. No concern for gross infection. 07/11/2022: The wound is roughly the same size with just a small opening in the center. She has been approved for Grafix. 07/18/2022: The wound has contracted somewhat and the surface has a healthier pink look to it. We applied Grafix last week and she is here for application  #2. 07/28/2022: The wound is about the same size but the surface continues to improve and appear more vital. She is here for Grafix application #3. 08/04/2022: There is still a stubborn central portion of her toe wound that is not closing in yet. The bone remains exposed. She is here for Grafix application #4. 08/11/2022: Today when I debrided the eschar and callus from her wound, purulent drainage was expressed from her wound. The underlying surface is unchanged with bone exposed. She has been having more pain in the toe and this may explain why. 08/18/2022: The culture that I took last week grew out Candida and Acinetobacter. We changed her dressing regimen to terbinafine instead of gentamicin and I prescribed levofloxacin instead of Augmentin. T oday, the wound does not look that much different. It remains small with granulation tissue at the base, but bone remains exposed. Patient History Social History Never smoker, Marital Status - Widowed, Alcohol Use - Never, Drug Use - No History, Caffeine Use - Rarely. Medical History Cardiovascular Patient has history of Arrhythmia - AV block, complete heart block, Congestive Heart Failure, Hypertension Endocrine Patient has history of Type II Diabetes Musculoskeletal Patient has history of Gout, Osteoarthritis Medical A Surgical History Notes nd Cardiovascular Pulmonary Hypertension Endocrine Hypoglycemia Neurologic Acute encephalopathy Objective Constitutional No acute distress.. Vitals Time Taken: 12:45 PM, Temperature: 98.7 F, Pulse: 83 bpm, Respiratory Rate: 16 breaths/min, Blood Pressure: 133/71 mmHg. Respiratory Normal work of breathing on room air.. General Notes: 08/18/2022: Today, the wound does not look that much different. It remains small with granulation tissue at the base, but bone remains exposed. Integumentary (Hair, Skin) Wound #1 status is Open. Original cause of wound was Gradually Appeared. The date acquired was:  09/29/2021. The wound has been in treatment 28 weeks. The wound is located on the Left T Great. The wound measures 0.1cm length x 0.1cm width x 0.1cm depth; 0.008cm^2  area and 0.001cm^3 volume. There is Fat oe Layer (Subcutaneous Tissue) exposed. There is no tunneling or undermining noted. There is a small amount of serous drainage noted. The wound margin is flat and intact. There is medium (34-66%) pink granulation within the wound bed. There is a medium (34-66%) amount of necrotic tissue within the wound bed including Eschar and Adherent Slough. 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 There was a Selective/Open Wound Non-Viable Tissue Debridement with a total area of 0.01 sq cm performed by Duanne Guess, MD. With the following instrument(s): Curette to remove Non-Viable tissue/material. Material removed includes Callus and Slough and after achieving pain control using Lidocaine 4% T opical Solution. No specimens were taken. A time out was conducted at 13:00, prior to the start of the procedure. A Minimum amount of bleeding was controlled with Pressure. The procedure was tolerated well with a pain level of 0 throughout and a pain level of 0 following the procedure. Post Debridement Measurements: 0.1cm length x 0.1cm width x 0.1cm depth; 0.001cm^3 volume. Character of Wound/Ulcer Post Debridement is improved. Severity of Tissue Post Debridement is: Fat layer exposed. Post procedure Diagnosis Wound #1: Same as Pre-Procedure General Notes: Scribed for Dr. Lady Gary by J.Scotton. Plan Follow-up  Appointments: Return Appointment in 2 weeks. - Dr. Lady Gary Room 3 Thursday 12th October at 12:30PM Other: - Grafix #4 (08/04/22)Grafix on hold (08/11/22) Anesthetic: (In clinic) Topical Lidocaine 4% applied to wound bed - In Clinic Cellular or Tissue Based Products: Wound #1 Left T Great: oe Cellular or Tissue Based Product applied to wound bed, secured with steri-strips, cover with Adaptic or Mepitel. (DO NOT REMOVE). - *** Do not remove dressing for a week**** Grafix PL PRIME 55mm (Disc) Bathing/ Shower/ Hygiene: May shower with protection but do not get wound dressing(s) wet. - Do Not Left Great T wet**** Skin Substitute (Grafix #4) applied 08/04/22**** GRAFIX ON oe HOLD 08/11/22 Off-Loading: Other: - No direct pressure on toes WOUND #1: - T Great Wound Laterality: Left oe Cleanser: Wound Cleanser 1 x Per Day/30 Days Discharge Instructions: Cleanse the wound with wound cleanser prior to applying a clean dressing using gauze sponges, not tissue or cotton balls. Topical: Ketoconazole Cream 2% 1 x Per Day/30 Days Discharge Instructions: Apply Ketoconazole as directed Prim Dressing: KerraCel Ag Gelling Fiber Dressing, 2x2 in (silver alginate) 1 x Per Day/30 Days ary Discharge Instructions: Apply silver alginate to wound bed as instructed Prim Dressing: Optifoam Non-Adhesive Dressing, 4x4 in 1 x Per Day/30 Days ary Discharge Instructions: Apply to Left Great toe to protect toe Prim Dressing: GRAFIX 1 x Per Day/30 Days ary Discharge Instructions: On hold 08/18/22 Secondary Dressing: Woven Gauze Sponges 2x2 in 1 x Per Day/30 Days Discharge Instructions: Apply over primary dressing as directed. Secured With: Insurance underwriter, Sterile 2x75 (in/in) 1 x Per Day/30 Days Discharge Instructions: Secure with stretch gauze as directed. Secured With: Paper T ape, 1x10 (in/yd) 1 x Per Day/30 Days Discharge Instructions: Secure dressing with tape as directed. 08/18/2022: Today, the  wound does not look that much different. It remains small with granulation tissue at the base, but bone remains exposed. I debrided slough and  periwound callus from the wound site. We will continue to use the topical terbinafine and she will complete her course of oral levofloxacin. Due to scheduling issues, she will return in 2 weeks. Electronic Signature(s) Signed: 08/18/2022 1:09:23 PM By: Duanne Guess MD FACS Entered By: Duanne Guess on 08/18/2022 13:09:23 -------------------------------------------------------------------------------- HxROS Details Patient Name: Date of Service: [name], [name]. 08/18/2022 12:30 PM Medical Record Number: [id number] Patient Account Number: [id number] Date of Birth/Sex: Treating RN: [birth year] (88 y.o. F) Primary Care Provider: SYSTEM, PRO V IDER Other Clinician: Referring Provider: Treating Provider/Extender: [name] in Treatment: 28 Cardiovascular Medical History: Positive for: Arrhythmia - AV block, complete heart block; Congestive Heart Failure; Hypertension Past Medical History Notes: Pulmonary Hypertension Endocrine Medical History: Positive for: Type II Diabetes Past Medical History Notes: Hypoglycemia Treated with: Oral agents Blood sugar tested every day: Yes Tested : 2-3x a day Musculoskeletal Medical History: Positive for: Gout; Osteoarthritis Neurologic Medical History: Past Medical History Notes: Acute encephalopathy Immunizations Pneumococcal Vaccine: Received Pneumococcal Vaccination: No Implantable Devices No devices added Family and Social History Never smoker; Marital Status - Widowed; Alcohol Use: Never; Drug Use: No History; Caffeine Use: Rarely; Financial Concerns: No; Food, Clothing or Shelter Needs: No; Support System Lacking: No; Transportation Concerns: No Psychologist, prison and probation services) Signed: 08/18/2022 2:53:26 PM By: Duanne Guess MD FACS Entered By: Duanne Guess on 08/18/2022  13:07:42 -------------------------------------------------------------------------------- SuperBill Details Patient Name: Date of Service: [name] 08/18/2022 Medical Record Number: [id number] Patient Account Number: [id number] Date of Birth/Sex: Treating RN: 29-Dec-1930 (88 y.o. F) Primary Care Provider: SYSTEM, PRO V IDER Other Clinician: Referring Provider: Treating Provider/Extender: [name] in Treatment: 28 Diagnosis Coding ICD-10 Codes Code Description E11.621 Type 2 diabetes mellitus with foot ulcer L97.522 Non-pressure chronic ulcer of other part of left foot with fat layer exposed I50.32 Chronic diastolic (congestive) heart failure I27.20 Pulmonary hypertension, unspecified Facility Procedures CPT4 Code: [id number] IC L Description: 97597 - DEBRIDE WOUND 1ST 20 SQ CM OR < D-10 Diagnosis Description 97.522 Non-pressure chronic ulcer of other part of left foot with fat layer exposed Modifier: Quantity: 1 Physician Procedures : CPT4 Code Description Modifier [id number] 99214 - WC PHYS LEVEL 4 - EST PT 25 ICD-10 Diagnosis Description E11.621 Type 2 diabetes mellitus with foot ulcer L97.522 Non-pressure chronic ulcer of other part of left foot with fat layer exposed I50.32 Chronic  diastolic (congestive) heart failure I27.20 Pulmonary hypertension, unspecified Quantity: 1 : [id number] 97597 - WC PHYS DEBR WO ANESTH 20 SQ CM ICD-10 Diagnosis Description L97.522 Non-pressure chronic ulcer of other part of left foot with fat layer exposed Quantity: 1 Electronic Signature(s) Signed: 08/18/2022 1:09:41 PM By: Duanne Guess MD FACS Entered By: Duanne Guess on 08/18/2022 13:09:41

## 2022-09-01 ENCOUNTER — Encounter (HOSPITAL_BASED_OUTPATIENT_CLINIC_OR_DEPARTMENT_OTHER): Payer: Medicare Other | Attending: General Surgery | Admitting: General Surgery

## 2022-09-01 DIAGNOSIS — E11621 Type 2 diabetes mellitus with foot ulcer: Secondary | ICD-10-CM | POA: Insufficient documentation

## 2022-09-01 DIAGNOSIS — I110 Hypertensive heart disease with heart failure: Secondary | ICD-10-CM | POA: Insufficient documentation

## 2022-09-01 DIAGNOSIS — E1151 Type 2 diabetes mellitus with diabetic peripheral angiopathy without gangrene: Secondary | ICD-10-CM | POA: Diagnosis not present

## 2022-09-01 DIAGNOSIS — I5032 Chronic diastolic (congestive) heart failure: Secondary | ICD-10-CM | POA: Insufficient documentation

## 2022-09-01 DIAGNOSIS — I442 Atrioventricular block, complete: Secondary | ICD-10-CM | POA: Insufficient documentation

## 2022-09-01 DIAGNOSIS — L97522 Non-pressure chronic ulcer of other part of left foot with fat layer exposed: Secondary | ICD-10-CM | POA: Insufficient documentation

## 2022-09-01 NOTE — Progress Notes (Signed)
[name] ([id number]) [id number] Page 1 of 7 Visit Report for 09/01/2022 Arrival Information Details Patient Name: Date of Service: [name], [name] 09/01/2022 12:30 PM Medical Record Number: [id number] Patient Account Number: [id number] Date of Birth/Sex: Treating RN: 11/13/1931 (88 y.o. [name], [name] Primary Care Aigner Horseman: SYSTEM, PRO V IDER Other Clinician: Referring Jametta Moorehead: Treating Cornella Emmer/Extender: Jadene Pierini in Treatment: 30 Visit Information History Since Last Visit Added or deleted any medications: No Patient Arrived: Wheel Chair Any new allergies or adverse reactions: No Arrival Time: 12:43 Had a fall or experienced change in No Accompanied By: son activities of daily living that may affect Transfer Assistance: None risk of falls: Patient Identification Verified: Yes Signs or symptoms of abuse/neglect since last visito No Patient Requires Transmission-Based Precautions: No Hospitalized since last visit: No Patient Has Alerts: Yes Implantable device outside of the clinic excluding No Patient Alerts: ABI not obtainable cellular tissue based products placed in the center since last visit: Has Dressing in Place as Prescribed: Yes Pain Present Now: Yes Electronic Signature(s) Signed: 09/01/2022 5:50:33 PM By: Dellie Catholic RN Entered By: Dellie Catholic on 09/01/2022 12:53:09 -------------------------------------------------------------------------------- Encounter Discharge Information Details Patient Name: Date of Service: [name], [name]. 09/01/2022 12:30 PM Medical Record Number: [id number] Patient Account Number: [id number] Date of Birth/Sex: Treating RN: 20-Jan-1931 (88 y.o. [name] Primary Care Gwenith Tschida: SYSTEM, PRO Ave Filter Other Clinician: Referring Veda Arrellano: Treating Cephas Revard/Extender: Jadene Pierini in Treatment: 30 Encounter Discharge Information Items Post Procedure  Vitals Discharge Condition: Stable Temperature (F): 98.2 Ambulatory Status: Wheelchair Pulse (bpm): 93 Discharge Destination: Home Respiratory Rate (breaths/min): 16 Transportation: Private Auto Blood Pressure (mmHg): 140/87 Accompanied By: son Schedule Follow-up Appointment: Yes Clinical Summary of Care: Patient Declined Electronic Signature(s) Signed: 09/01/2022 5:50:33 PM By: Dellie Catholic RN Entered By: Dellie Catholic on 09/01/2022 17:38:07 [name] ([id number]) [id number] Page 2 of 7 -------------------------------------------------------------------------------- Lower Extremity Assessment Details Patient Name: Date of Service: [name], [name] 09/01/2022 12:30 PM Medical Record Number: [id number] Patient Account Number: [id number] Date of Birth/Sex: Treating RN: 14-Aug-1931 (88 y.o. [name] Primary Care Naly Schwanz: SYSTEM, PRO Ave Filter Other Clinician: Referring Leeloo Silverthorne: Treating Teneil Shiller/Extender: Jadene Pierini in Treatment: 30 Edema Assessment Assessed: [Left: No] [Right: No] Edema: [Left: Ye] [Right: s] Calf Left: Right: Point of Measurement: 26 cm From Medial Instep 39 cm Ankle Left: Right: Point of Measurement: 10 cm From Medial Instep 25 cm Vascular Assessment Pulses: Dorsalis Pedis Palpable: [Left:Yes] Electronic Signature(s) Signed: 09/01/2022 5:50:33 PM By: Dellie Catholic RN Entered By: Dellie Catholic on 09/01/2022 12:54:17 -------------------------------------------------------------------------------- Multi Wound Chart Details Patient Name: Date of Service: [name]. 09/01/2022 12:30 PM Medical Record Number: [id number] Patient Account Number: [id number] Date of Birth/Sex: Treating RN: [birth year] (88 y.o. F) Primary Care Akacia Boltz: SYSTEM, PRO V IDER Other Clinician: Referring Joseph Bias: Treating Bertrice Leder/Extender: Jadene Pierini in Treatment: 30 Vital  Signs Height(in): Pulse(bpm): 55 Weight(lbs): Blood Pressure(mmHg): 140/87 Body Mass Index(BMI): Temperature(F): 98.2 Respiratory Rate(breaths/min): 16 [1:Photos:] [N/A:N/A] Left T Great oe N/A N/A Wound Location: Gradually Appeared N/A N/A Wounding Event: Diabetic Wound/Ulcer of the Lower N/A N/A Primary Etiology: Extremity Arrhythmia, Congestive Heart Failure, N/A N/A Comorbid History: Hypertension, Type II Diabetes, Gout, Osteoarthritis 09/29/2021 N/A N/A Date Acquired: 30 N/A N/A Weeks of Treatment: Open N/A N/A Wound Status: No N/A N/A Wound Recurrence: 0.5x0.5x0.2 N/A N/A Measurements L x W x D (cm) 0.196 N/A N/A A (cm) : rea 0.039 N/A N/A Volume (cm) : 30.70% N/A N/A % Reduction  in A rea: 31.60% N/A N/A % Reduction in Volume: Grade 2 N/A N/A Classification: Small N/A N/A Exudate A mount: Serous N/A N/A Exudate Type: amber N/A N/A Exudate Color: Flat and Intact N/A N/A Wound Margin: Medium (34-66%) N/A N/A Granulation A mount: Pink N/A N/A Granulation Quality: Medium (34-66%) N/A N/A Necrotic A mount: Eschar, Adherent Slough N/A N/A Necrotic Tissue: Fat Layer (Subcutaneous Tissue): Yes N/A N/A Exposed Structures: Fascia: No Tendon: No Muscle: No Joint: No Bone: No Small (1-33%) N/A N/A Epithelialization: Debridement - Selective/Open Wound N/A N/A Debridement: Pre-procedure Verification/Time Out 12:59 N/A N/A Taken: Lidocaine 5% topical ointment N/A N/A Pain Control: Necrotic/Eschar, Callus N/A N/A Tissue Debrided: Non-Viable Tissue N/A N/A Level: 0.25 N/A N/A Debridement A (sq cm): rea Curette N/A N/A Instrument: Minimum N/A N/A Bleeding: Pressure N/A N/A Hemostasis A chieved: 0 N/A N/A Procedural Pain: 0 N/A N/A Post Procedural Pain: Procedure was tolerated well N/A N/A Debridement Treatment Response: 0.5x0.5x0.2 N/A N/A Post Debridement Measurements L x W x D (cm) 0.039 N/A N/A Post Debridement Volume:  (cm) Induration: No N/A N/A Periwound Skin Texture: No Abnormalities Noted N/A N/A Periwound Skin Moisture: No Abnormality N/A N/A Temperature: Debridement N/A N/A Procedures Performed: Treatment Notes Electronic Signature(s) Signed: 09/01/2022 1:28:47 PM By: Fredirick Maudlin MD FACS Entered By: Fredirick Maudlin on 09/01/2022 13:28:47 -------------------------------------------------------------------------------- Multi-Disciplinary Care Plan Details Patient Name: Date of Service: [name]. 09/01/2022 12:30 PM Medical Record Number: [id number] Patient Account Number: [id number] Date of Birth/Sex: Treating RN: Feb 18, 1931 (88 y.o. [name] Primary Care Betta Balla: SYSTEM, PRO Ave Filter Other Clinician: Referring Syanna Remmert: Treating Ryliee Figge/Extender: Jadene Pierini in Treatment: [place] reviewed with physician [address] JODILYN, GIESE ([id number]) [id number] Page 4 of 7 Nutrition Nursing Diagnoses: Impaired glucose control: actual or potential Potential for alteratiion in Nutrition/Potential for imbalanced nutrition Goals: Patient/caregiver will maintain therapeutic glucose control Date Initiated: 02/01/2022 Target Resolution Date: 10/20/2022 Goal Status: Active Interventions: Assess HgA1c results as ordered upon admission and as needed Assess patient nutrition upon admission and as needed per policy Provide education on elevated blood sugars and impact on wound healing Provide education on nutrition Treatment Activities: Education provided on Nutrition : 02/01/2022 Notes: Wound/Skin Impairment Nursing Diagnoses: Impaired tissue integrity Knowledge deficit related to ulceration/compromised skin integrity Goals: Patient/caregiver will verbalize understanding of skin care regimen Date Initiated: 02/01/2022 Date Inactivated: 05/18/2022 Target Resolution Date: 04/28/2022 Goal Status: Met Ulcer/skin  breakdown will have a volume reduction of 30% by week 4 Date Initiated: 02/01/2022 Date Inactivated: 03/24/2022 Target Resolution Date: 03/31/2022 Goal Status: Unmet Unmet Reason: PAD Ulcer/skin breakdown will have a volume reduction of 50% by week 8 Date Initiated: 04/06/2022 Target Resolution Date: 10/20/2022 Goal Status: Active Interventions: Assess patient/caregiver ability to obtain necessary supplies Assess patient/caregiver ability to perform ulcer/skin care regimen upon admission and as needed Assess ulceration(s) every visit Provide education on ulcer and skin care Notes: Electronic Signature(s) Signed: 09/01/2022 5:50:33 PM By: Dellie Catholic RN Entered By: Dellie Catholic on 09/01/2022 17:36:57 -------------------------------------------------------------------------------- Pain Assessment Details Patient Name: Date of Service: [name] 09/01/2022 12:30 PM Medical Record Number: [id number] Patient Account Number: [id number] Date of Birth/Sex: Treating RN: Mar 20, 1931 (88 y.o. [name] Primary Care Jaelene Garciagarcia: SYSTEM, PRO Ave Filter Other Clinician: Referring Krystopher Kuenzel: Treating Lunabelle Oatley/Extender: Jadene Pierini in Treatment: 30 Active Problems Location of Pain Severity and Description of Pain Patient Has Paino Yes Site Locations Pain LocationBRANDALYN, [name] ([id number]) [id number] Page 5 of 7 Pain Location: Generalized  Pain With Dressing Change: No Duration of the Pain. Constant / Intermittento Constant Rate the pain. Current Pain Level: 9 Worst Pain Level: 10 Least Pain Level: 3 Tolerable Pain Level: 5 Character of Pain Describe the Pain: Difficult to Pinpoint Pain Management and Medication Current Pain Management: Medication: No Cold Application: No Rest: No Massage: No Activity: No T.E.N.S.: No Heat Application: No Leg drop or elevation: No Is the Current Pain Management Adequate: Adequate How does  your wound impact your activities of daily livingo Sleep: No Bathing: No Appetite: No Relationship With Others: No Bladder Continence: No Emotions: No Bowel Continence: No Work: No Toileting: No Drive: No Dressing: No Hobbies: No Electronic Signature(s) Signed: 09/01/2022 5:50:33 PM By: Dellie Catholic RN Entered By: Dellie Catholic on 09/01/2022 12:54:08 -------------------------------------------------------------------------------- Patient/Caregiver Education Details Patient Name: Date of Service: [name] 10/12/2023andnbsp12:30 PM Medical Record Number: [id number] Patient Account Number: [id number] Date of Birth/Gender: Treating RN: October 03, 1931 (88 y.o. [name] Primary Care Physician: SYSTEM, PRO V IDER Other Clinician: Referring Physician: Treating Physician/Extender: Jadene Pierini in Treatment: 30 Education Assessment Education Provided To: Patient Education Topics Provided Wound/Skin Impairment: Methods: Explain/Verbal Responses: Return demonstration correctly Electronic Signature(s) Signed: 09/01/2022 5:50:33 PM By: Dellie Catholic RN [name], [name] ([id number]) [id number] Page 6 of 7 Entered By: Dellie Catholic on 09/01/2022 17:37:11 -------------------------------------------------------------------------------- Wound Assessment Details Patient Name: Date of Service: [name], [name] 09/01/2022 12:30 PM Medical Record Number: [id number] Patient Account Number: [id number] Date of Birth/Sex: Treating RN: 23-Mar-1931 (88 y.o. [name] Primary Care Amaria Mundorf: SYSTEM, PRO V IDER Other Clinician: Referring Lusia Greis: Treating Markela Wee/Extender: Jadene Pierini in Treatment: 30 Wound Status Wound Number: 1 Primary Diabetic Wound/Ulcer of the Lower Extremity Etiology: Wound Location: Left T Great oe Wound Open Wounding Event: Gradually Appeared Status: Date Acquired: 09/29/2021 Comorbid  Arrhythmia, Congestive Heart Failure, Hypertension, Type II Weeks Of Treatment: 30 History: Diabetes, Gout, Osteoarthritis Clustered Wound: No Photos Wound Measurements Length: (cm) 0.5 Width: (cm) 0.5 Depth: (cm) 0.2 Area: (cm) 0.196 Volume: (cm) 0.039 % Reduction in Area: 30.7% % Reduction in Volume: 31.6% Epithelialization: Small (1-33%) Tunneling: No Undermining: No Wound Description Classification: Grade 2 Wound Margin: Flat and Intact Exudate Amount: Small Exudate Type: Serous Exudate Color: amber Foul Odor After Cleansing: No Slough/Fibrino Yes Wound Bed Granulation Amount: Medium (34-66%) Exposed Structure Granulation Quality: Pink Fascia Exposed: No Necrotic Amount: Medium (34-66%) Fat Layer (Subcutaneous Tissue) Exposed: Yes Necrotic Quality: Eschar, Adherent Slough Tendon Exposed: No Muscle Exposed: No Joint Exposed: No Bone Exposed: No Periwound Skin Texture Texture Color No Abnormalities Noted: No No Abnormalities Noted: No Induration: No Temperature / Pain Temperature: No Abnormality Moisture No Abnormalities Noted: Yes Treatment Notes [name], [name] ([id number]) [id number] Page 7 of 7 Wound #1 (Toe Great) Wound Laterality: Left Cleanser Wound Cleanser Discharge Instruction: Cleanse the wound with wound cleanser prior to applying a clean dressing using gauze sponges, not tissue or cotton balls. Peri-Wound Care Topical Primary Dressing KerraCel Ag Gelling Fiber Dressing, 2x2 in (silver alginate) Discharge Instruction: Apply silver alginate to wound bed as instructed Optifoam Non-Adhesive Dressing, 4x4 in Discharge Instruction: Apply to Left Great toe to protect toe [place] Discharge Instruction: On hold 08/18/22 Secondary Dressing Optifoam Non-Adhesive Dressing, 4x4 in Discharge Instruction: Apply over primary dressing can use an Alleyn for padding Woven Gauze Sponges 2x2 in Discharge Instruction: Apply over  primary dressing as directed. Secured With Conforming Stretch Gauze Bandage, Sterile 2x75 (in/in) Discharge Instruction: Secure with stretch gauze as directed. Paper Tape, 1x10 (  in/yd) Discharge Instruction: Secure dressing with tape as directed. Compression Wrap Compression Stockings Add-Ons Electronic Signature(s) Signed: 09/01/2022 5:50:33 PM By: Dellie Catholic RN Entered By: Dellie Catholic on 09/01/2022 12:59:10 -------------------------------------------------------------------------------- Vitals Details Patient Name: Date of Service: [name], [name]. 09/01/2022 12:30 PM Medical Record Number: [id number] Patient Account Number: [id number] Date of Birth/Sex: Treating RN: 1931-06-02 (88 y.o. [name] Sprecher Primary Care Chandrea Zellman: SYSTEM, PRO V IDER Other Clinician: Referring Isamu Trammel: Treating Lulabelle Desta/Extender: Jadene Pierini in Treatment: 30 Vital Signs Time Taken: 12:43 Temperature (F): 98.2 Pulse (bpm): 93 Respiratory Rate (breaths/min): 16 Blood Pressure (mmHg): 140/87 Reference Range: 80 - 120 mg / dl Electronic Signature(s) Signed: 09/01/2022 5:50:33 PM By: Dellie Catholic RN Entered By: Dellie Catholic on 09/01/2022 12:53:30

## 2022-09-01 NOTE — Progress Notes (Addendum)
TOPAZ, RAGLIN ([id number]) [id number] Page 1 of 10 Visit Report for 09/01/2022 Chief Complaint Document Details Patient Name: Date of Service: [name], [name] 09/01/2022 12:30 PM Medical Record Number: [id number] Patient Account Number: [id number] Date of Birth/Sex: Treating RN: 12/14/1930 (88 y.o. F) Primary Care Provider: SYSTEM, PRO V IDER Other Clinician: Referring Provider: Treating Provider/Extender: [name] in Treatment: 30 Information Obtained from: Patient Chief Complaint Patient presents to the wound care center with open non-healing surgical wound(s) Electronic Signature(s) Signed: 09/01/2022 1:29:20 PM By: Duanne Guess MD FACS Entered By: Duanne Guess on 09/01/2022 13:29:20 -------------------------------------------------------------------------------- Debridement Details Patient Name: Date of Service: [name]. 09/01/2022 12:30 PM Medical Record Number: [id number] Patient Account Number: [id number] Date of Birth/Sex: Treating RN: 28-Mar-1931 (88 y.o. [name] Primary Care Provider: SYSTEM, PRO V IDER Other Clinician: Referring Provider: Treating Provider/Extender: [name] in Treatment: 30 Debridement Performed for Assessment: Wound #1 Left T Great oe Performed By: Physician Duanne Guess, MD Debridement Type: Debridement Severity of Tissue Pre Debridement: Fat layer exposed Level of Consciousness (Pre-procedure): Awake and Alert Pre-procedure Verification/Time Out Yes - 12:59 Taken: Start Time: 12:59 Pain Control: Lidocaine 5% topical ointment T Area Debrided (L x W): otal 0.5 (cm) x 0.5 (cm) = 0.25 (cm) Tissue and other material debrided: Non-Viable, Callus, Eschar Level: Non-Viable Tissue Debridement Description: Selective/Open Wound Instrument: Curette Bleeding: Minimum Hemostasis Achieved: Pressure End Time: 13:01 Procedural Pain: 0 Post Procedural Pain:  0 Response to Treatment: Procedure was tolerated well Level of Consciousness (Post- Awake and Alert procedure): Post Debridement Measurements of Total Wound Length: (cm) 0.5 Width: (cm) 0.5 Depth: (cm) 0.2 Volume: (cm) 0.039 Character of Wound/Ulcer Post Debridement: Improved Severity of Tissue Post Debridement: Fat layer exposed ELLASYN, SWILLING ([id number]) [id number] Page 2 of 10 Post Procedure Diagnosis Same as Pre-procedure Notes Scribed for Dr. Lady Gary by J.Scotton Electronic Signature(s) Signed: 09/01/2022 1:45:39 PM By: Duanne Guess MD FACS Signed: 09/01/2022 5:50:33 PM By: Karie Schwalbe RN Entered By: Karie Schwalbe on 09/01/2022 13:10:57 -------------------------------------------------------------------------------- HPI Details Patient Name: Date of Service: [name], [name]. 09/01/2022 12:30 PM Medical Record Number: [id number] Patient Account Number: [id number] Date of Birth/Sex: Treating RN: August 08, 1931 (88 y.o. F) Primary Care Provider: SYSTEM, PRO V IDER Other Clinician: Referring Provider: Treating Provider/Extender: [name] in Treatment: 30 History of Present Illness HPI Description: ADMISSION 02/01/2022 This is an 88 year old woman who is a resident of [place]. She has a past medical history notable for type 2 diabetes mellitus, congestive heart failure, pulmonary hypertension, and complete heart block. She is accompanied today by her son. Approximately 4 months ago, she had an ingrown toe nail removed. Apparently this went a bit deep. Since that time, it has not healed. The patient is nonambulatory. Apparently they have been applying Santyl to the site at her facility. She was on antibiotics but I am not certain which specific drugs she took. She completed her last course a few weeks ago. We were unable to obtain ABIs in clinic secondary to the patient's body habitus. She has not had any formal vascular  studies nor any x-ray of the area. 02/09/2022: The wound is roughly the same size today, but the amount of slough in the base has decreased significantly. She apparently had an x-ray done at her facility, but we do not have the report; per verbal report from facility staff, "there was nothing there." We had requested formal ABI/TBI studies, but these were not done. She has been in [employer]  under Hydrofera [facility]. 02/16/2022: The wound measured about the same today, but to my eye, it appears rather smaller. The slough in the base is minimal. Her vascular studies are scheduled for Friday. We have been using Santyl under Hydrofer[facility] [employer]. No concern for infection. 02/23/2022: Once again, the wound has been measured about the same size, but I think it looks a bit smaller. There is slough at the base. She had her ABIs done last week and these show severe bilateral peripheral arterial disease. We received the report of the foot x-ray that was ordered. There is no concern for osteomyelitis at this time. 03/02/2022: The wound is smaller today with a small amount of slough at the base. We placed a referral to vein and vascular surgery last week in response to her severe peripheral arterial disease, but she does not yet have an appointment on the books. 03/16/2022: She saw Dr. Lenell Antu in the vascular surgery department yesterday. He did not think she was a good candidate for any revascularization options and suggested that she continue local wound care for another month. If she fails to heal in that time, they may consider an angiogram with potential intervention. The wound today is roughly the same size but there is less exposed bone at the base and the surface is clean without any substantial slough. 03/24/2022: The wound appears about the same. There is some periwound callus that has heaped up. We changed her to Prisma silver collagen last week. 04/06/2022: No significant change to the wound. It is neither improving nor  deteriorating. She continues to accumulate some periwound callus around the margin. Bone is exposed at the base. 04/20/2022: The wound has improved quite a bit over the past 2 weeks. It is smaller and there is no real accumulation of periwound callus. She had follow-up with Dr. Lenell Antu yesterday and he felt that the wound was progressing well enough that no arterial intervention was necessary. 05/18/2022: The patient has been absent due to a fall from her wheelchair during her last transportation that was supposed to be to her clinic. Today, the wound is about the same size. There is still bone exposed at the base. There is periwound callus accumulation. No concern for infection. 06/01/2022: The wound is actually a little bit smaller today and the surface is not as pale. She still has accumulated periwound callus. 06/15/2022: The wound continues to contract, albeit slowly. No concern for infection. 06/28/2022: The wound is slightly smaller again today. She continues to accumulate callus and there is some periwound epibole at the margins. No concern for gross infection. 07/11/2022: The wound is roughly the same size with just a small opening in the center. She has been approved for Grafix. 07/18/2022: The wound has contracted somewhat and the surface has a healthier pink look to it. We applied Grafix last week and she is here for application #2. 07/28/2022: The wound is about the same size but the surface continues to improve and appear more vital. She is here for Grafix application #3. 08/04/2022: There is still a stubborn central portion of her toe wound that is not closing in yet. The bone remains exposed. She is here for Grafix application #4. TONY, FRISCIA ([id number]) [id number] Page 3 of 10 08/11/2022: Today when I debrided the eschar and callus from her wound, purulent drainage was expressed from her wound. The underlying surface is unchanged with bone exposed. She has been  having more pain in the toe and this may explain why. 08/18/2022: The  culture that I took last week grew out Candida and Acinetobacter. We changed her dressing regimen to terbinafine instead of gentamicin and I prescribed levofloxacin instead of Augmentin. T oday, the wound does not look that much different. It remains small with granulation tissue at the base, but bone remains exposed. 09/01/2022: The wound is about the same. I had debrided a fair amount of periwound callus and nonviable skin last week, so it does measure a little bit larger. The bone is still exposed. No obvious signs of infection. I requested a plain x-ray of the patient's foot a month ago and still have not received the results of that study, performed at her nursing facility. She continues to complain of pain in her toe and I am concerned that she has osteomyelitis. Based upon her previous ABIs, she does not have adequate blood flow to heal an amputation and she has failed to make much progress in terms of her wound healing. Electronic Signature(s) Signed: 09/01/2022 1:33:26 PM By: Duanne Guess MD FACS Entered By: Duanne Guess on 09/01/2022 13:33:26 -------------------------------------------------------------------------------- Physical Exam Details Patient Name: Date of Service: [name]. 09/01/2022 12:30 PM Medical Record Number: [id number] Patient Account Number: [id number] Date of Birth/Sex: Treating RN: [birth year] (88 y.o. F) Primary Care Provider: SYSTEM, PRO V IDER Other Clinician: Referring Provider: Treating Provider/Extender: [name] in Treatment: 30 Constitutional . . . . No acute distress.[name] Respiratory Normal work of breathing on room air.. Notes 09/01/2022: The wound is about the same. I had debrided a fair amount of periwound callus and nonviable skin last week, so it does measure a little bit larger. The bone is still exposed. No obvious signs of infection. Electronic  Signature(s) Signed: 09/01/2022 1:34:02 PM By: Duanne Guess MD FACS Entered By: Duanne Guess on 09/01/2022 13:34:02 -------------------------------------------------------------------------------- Physician Orders Details Patient Name: Date of Service: [name]. 09/01/2022 12:30 PM Medical Record Number: [id number] Patient Account Number: [id number] Date of Birth/Sex: Treating RN: 05-Oct-1931 (88 y.o. [name] Primary Care Provider: SYSTEM, PRO V IDER Other Clinician: Referring Provider: Treating Provider/Extender: [name] in Treatment: 30 Verbal / Phone Orders: No Diagnosis Coding ICD-10 Coding Code Description E11.621 Type 2 diabetes mellitus with foot ulcer L97.522 Non-pressure chronic ulcer of other part of left foot with fat layer exposed I50.32 Chronic diastolic (congestive) heart failure I27.20 Pulmonary hypertension, unspecified EMMALENE, KATTNER ([id number]) [id number] Page 4 of 10 Follow-up Appointments ppointment in 2 weeks. - Dr. Lady Gary Room 3 Thursday 26th October at 1:15PM Return A Other: - Grafix #4 (08/04/22)Grafix on hold (08/11/22) Anesthetic (In clinic) Topical Lidocaine 4% applied to wound bed - In Clinic Cellular or Tissue Based Products Wound #1 Left T Great oe daptic or Mepitel. (DO NOT REMOVE). - *** Cellular or Tissue Based Product applied to wound bed, secured with steri-strips, cover with A Do not remove dressing for a week**** Grafix PL PRIME 57mm (Disc) Bathing/ Shower/ Hygiene May shower with protection but do not get wound dressing(s) wet. - Do Not Left Great T wet**** Skin Substitute (Grafix #4) applied 08/04/22**** oe GRAFIX ON HOLD 08/11/22 Off-Loading Other: - No direct pressure on toes Wound Treatment Wound #1 - T Great oe Wound Laterality: Left Cleanser: Wound Cleanser 1 x Per Day/30 Days Discharge Instructions: Cleanse the wound with wound cleanser prior to applying a  clean dressing using gauze sponges, not tissue or cotton balls. Prim Dressing: KerraCel Ag Gelling Fiber Dressing, 2x2 in (silver alginate) 1 x Per Day/30 Days ary Discharge  Instructions: Apply silver alginate to wound bed as instructed Prim Dressing: Optifoam Non-Adhesive Dressing, 4x4 in 1 x Per Day/30 Days ary Discharge Instructions: Apply to Left Great toe to protect toe Prim Dressing: GRAFIX 1 x Per Day/30 Days ary Discharge Instructions: On hold 08/18/22 Secondary Dressing: Optifoam Non-Adhesive Dressing, 4x4 in 1 x Per Day/30 Days Discharge Instructions: Apply over primary dressing can use an Alleyn for padding Secondary Dressing: Woven Gauze Sponges 2x2 in 1 x Per Day/30 Days Discharge Instructions: Apply over primary dressing as directed. Secured With: Insurance underwriter, Sterile 2x75 (in/in) 1 x Per Day/30 Days Discharge Instructions: Secure with stretch gauze as directed. Secured With: Paper Tape, 1x10 (in/yd) 1 x Per Day/30 Days Discharge Instructions: Secure dressing with tape as directed. Consults Vascular - Consult for an aortogram. Dr. Malena Edman - (ICD10 [phone] - Non-pressure chronic ulcer of other part of left foot with fat layer exposed) Electronic Signature(s) Signed: 09/05/2022 9:20:30 AM By: Duanne Guess MD FACS Signed: 09/05/2022 6:02:10 PM By: Karie Schwalbe RN Previous Signature: 09/01/2022 1:45:39 PM Version By: Duanne Guess MD FACS Entered By: Karie Schwalbe on 09/05/2022 08:26:40 Prescription 09/01/2022 -------------------------------------------------------------------------------- [name]. Duanne Guess MD Patient Name: Provider: 1931/05/26 [id number] Date of Birth: NPI#: [id number] Sex: DEA #: [phone] Phone #: License #: Eligha Bridegroom [facility] Patient Address: [facility] [address] [place] [address]  [address] [place], [place] [address] [place], [place] [address] JEANMARIE, MCCOWEN ([id number]) [id number] Page 5 of 10 [phone] Allergies aspirin; tramadol; naproxen; shellfish derived Provider's Orders Vascular - ICD10: Q25.956 - Consult for an aortogram. Dr. Malena Edman Hand Signature: Date(s): Electronic Signature(s) Signed: 09/05/2022 9:20:30 AM By: Duanne Guess MD FACS Signed: 09/05/2022 6:02:10 PM By: Karie Schwalbe RN Entered By: Karie Schwalbe on 09/05/2022 08:26:40 -------------------------------------------------------------------------------- Problem List Details Patient Name: Date of Service: [name]. 09/01/2022 12:30 PM Medical Record Number: [id number] Patient Account Number: [id number] Date of Birth/Sex: Treating RN: [birth year] (88 y.o. F) Primary Care Provider: SYSTEM, PRO V IDER Other Clinician: Referring Provider: Treating Provider/Extender: [name] in Treatment: 30 Active Problems ICD-10 Encounter Code Description Active Date MDM Diagnosis E11.621 Type 2 diabetes mellitus with foot ulcer 02/01/2022 No Yes L97.522 Non-pressure chronic ulcer of other part of left foot with fat layer exposed 02/01/2022 No Yes I50.32 Chronic diastolic (congestive) heart failure 02/01/2022 No Yes I27.20 Pulmonary hypertension, unspecified 02/01/2022 No Yes Inactive Problems Resolved Problems Electronic Signature(s) Signed: 09/01/2022 1:28:40 PM By: Duanne Guess MD FACS Entered By: Duanne Guess on 09/01/2022 13:28:40 [name] ([id number]) [id number] Page 6 of 10 -------------------------------------------------------------------------------- Progress Note Details Patient Name: Date of Service: LIVI, MCGANN 09/01/2022 12:30 PM Medical Record Number: [id number] Patient Account Number: [id number] Date of Birth/Sex: Treating RN: [birth year] (88 y.o. F) Primary Care Provider: SYSTEM,  PRO V IDER Other Clinician: Referring Provider: Treating Provider/Extender: [name] in Treatment: 30 Subjective Chief Complaint Information obtained from Patient Patient presents to the wound care center with open non-healing surgical wound(s) History of Present Illness (HPI) ADMISSION 02/01/2022 This is an 88 year old woman who is a resident of [place]. She has a past medical history notable for type 2 diabetes mellitus, congestive heart failure, pulmonary hypertension, and complete heart block. She is accompanied today by her son. Approximately 4 months ago, she had an ingrown toe nail removed. Apparently this went a bit deep. Since that time, it has not healed. The  patient is nonambulatory. Apparently they have been applying Santyl to the site at her facility. She was on antibiotics but I am not certain which specific drugs she took. She completed her last course a few weeks ago. We were unable to obtain ABIs in clinic secondary to the patient's body habitus. She has not had any formal vascular studies nor any x-ray of the area. 02/09/2022: The wound is roughly the same size today, but the amount of slough in the base has decreased significantly. She apparently had an x-ray done at her facility, but we do not have the report; per verbal report from facility staff, "there was nothing there." We had requested formal ABI/TBI studies, but these were not done. She has been in [place] under [employer]	[place]. 02/16/2022: The wound measured about the same today, but to my eye, it appears rather smaller. The slough in the base is minimal. Her vascular studies are scheduled for Friday. We have been using Santyl under Hydrofera Blue. No concern for infection. 02/23/2022: Once again, the wound has been measured about the same size, but I think it looks a bit smaller. There is slough at the base. She had her ABIs done last week and these show severe bilateral peripheral arterial disease. We  received the report of the foot x-ray that was ordered. There is no concern for osteomyelitis at this time. 03/02/2022: The wound is smaller today with a small amount of slough at the base. We placed a referral to vein and vascular surgery last week in response to her severe peripheral arterial disease, but she does not yet have an appointment on the books. 03/16/2022: She saw Dr. Lenell Antu in the vascular surgery department yesterday. He did not think she was a good candidate for any revascularization options and suggested that she continue local wound care for another month. If she fails to heal in that time, they may consider an angiogram with potential intervention. The wound today is roughly the same size but there is less exposed bone at the base and the surface is clean without any substantial slough. 03/24/2022: The wound appears about the same. There is some periwound callus that has heaped up. We changed her to Prisma silver collagen last week. 04/06/2022: No significant change to the wound. It is neither improving nor deteriorating. She continues to accumulate some periwound callus around the margin. Bone is exposed at the base. 04/20/2022: The wound has improved quite a bit over the past 2 weeks. It is smaller and there is no real accumulation of periwound callus. She had follow-up with Dr. Lenell Antu yesterday and he felt that the wound was progressing well enough that no arterial intervention was necessary. 05/18/2022: The patient has been absent due to a fall from her wheelchair during her last transportation that was supposed to be to her clinic. Today, the wound is about the same size. There is still bone exposed at the base. There is periwound callus accumulation. No concern for infection. 06/01/2022: The wound is actually a little bit smaller today and the surface is not as pale. She still has accumulated periwound callus. 06/15/2022: The wound continues to contract, albeit slowly. No concern for  infection. 06/28/2022: The wound is slightly smaller again today. She continues to accumulate callus and there is some periwound epibole at the margins. No concern for gross infection. 07/11/2022: The wound is roughly the same size with just a small opening in the center. She has been approved for Grafix. 07/18/2022: The wound has contracted somewhat  and the surface has a healthier pink look to it. We applied Grafix last week and she is here for application #2. 11/26/1094: The wound is about the same size but the surface continues to improve and appear more vital. She is here for Grafix application #3. 0/45/4098: There is still a stubborn central portion of her toe wound that is not closing in yet. The bone remains exposed. She is here for Grafix application #4. 12/09/1476: Today when I debrided the eschar and callus from her wound, purulent drainage was expressed from her wound. The underlying surface is unchanged with bone exposed. She has been having more pain in the toe and this may explain why. 08/18/2022: The culture that I took last week grew out Candida and Acinetobacter. We changed her dressing regimen to terbinafine instead of gentamicin and I prescribed levofloxacin instead of Augmentin. T oday, the wound does not look that much different. It remains small with granulation tissue at the base, but bone remains exposed. 09/01/2022: The wound is about the same. I had debrided a fair amount of periwound callus and nonviable skin last week, so it does measure a little bit larger. The bone is still exposed. No obvious signs of infection. I requested a plain x-ray of the patient's foot a month ago and still have not received the results of that study, performed at her nursing facility. She continues to complain of pain in her toe and I am concerned that she has osteomyelitis. Based upon her previous ABIs, she does not have adequate blood flow to heal an amputation and she has failed to make much progress  in terms of her wound healing. Patient History Social History Never smoker, Marital Status - Widowed, Alcohol Use - Never, Drug Use - No History, Caffeine Use - Rarely. Medical History Cardiovascular JILLAINE, WAREN ([id number]) [id number] Page 7 of 10 Patient has history of Arrhythmia - AV block, complete heart block, Congestive Heart Failure, Hypertension Endocrine Patient has history of Type II Diabetes Musculoskeletal Patient has history of Gout, Osteoarthritis Medical A Surgical History Notes nd Cardiovascular Pulmonary Hypertension Endocrine Hypoglycemia Neurologic Acute encephalopathy Objective Constitutional No acute distress.. Vitals Time Taken: 12:43 PM, Temperature: 98.2 F, Pulse: 93 bpm, Respiratory Rate: 16 breaths/min, Blood Pressure: 140/87 mmHg. Respiratory Normal work of breathing on room air.. General Notes: 09/01/2022: The wound is about the same. I had debrided a fair amount of periwound callus and nonviable skin last week, so it does measure a little bit larger. The bone is still exposed. No obvious signs of infection. Integumentary (Hair, Skin) Wound #1 status is Open. Original cause of wound was Gradually Appeared. The date acquired was: 09/29/2021. The wound has been in treatment 30 weeks. The wound is located on the Left T Great. The wound measures 0.5cm length x 0.5cm width x 0.2cm depth; 0.196cm^2 area and 0.039cm^3 volume. There is Fat oe Layer (Subcutaneous Tissue) exposed. There is no tunneling or undermining noted. There is a small amount of serous drainage noted. The wound margin is flat and intact. There is medium (34-66%) pink granulation within the wound bed. There is a medium (34-66%) amount of necrotic tissue within the wound bed including Eschar and Adherent Slough. The periwound skin appearance had no abnormalities noted for moisture. The periwound skin appearance did not exhibit: Induration. Periwound temperature  was noted as No Abnormality. Assessment Active Problems ICD-10 Type 2 diabetes mellitus with foot ulcer Non-pressure chronic ulcer of other part of left foot with fat layer exposed Chronic diastolic (  congestive) heart failure Pulmonary hypertension, unspecified Procedures Wound #1 Pre-procedure diagnosis of Wound #1 is a Diabetic Wound/Ulcer of the Lower Extremity located on the Left T Great .Severity of Tissue Pre Debridement is: oe Fat layer exposed. There was a Selective/Open Wound Non-Viable Tissue Debridement with a total area of 0.25 sq cm performed by Duanne Guess, MD. With the following instrument(s): Curette to remove Non-Viable tissue/material. Material removed includes Eschar and Callus and after achieving pain control using Lidocaine 5% topical ointment. No specimens were taken. A time out was conducted at 12:59, prior to the start of the procedure. A Minimum amount of bleeding was controlled with Pressure. The procedure was tolerated well with a pain level of 0 throughout and a pain level of 0 following the procedure. Post Debridement Measurements: 0.5cm length x 0.5cm width x 0.2cm depth; 0.039cm^3 volume. Character of Wound/Ulcer Post Debridement is improved. Severity of Tissue Post Debridement is: Fat layer exposed. Post procedure Diagnosis Wound #1: Same as Pre-Procedure General Notes: Scribed for Dr. Lady Gary by J.Scotton. Plan Follow-up Appointments: Return Appointment in 2 weeks. - Dr. Lady Gary Room 3 Thursday 26th October at 1:15PM Other: - Grafix #4 (08/04/22)Grafix on hold (08/11/22) WILHELMINA, HARK ([id number]) [id number] Page 8 of 10 Anesthetic: (In clinic) Topical Lidocaine 4% applied to wound bed - In Clinic Cellular or Tissue Based Products: Wound #1 Left T Great: oe Cellular or Tissue Based Product applied to wound bed, secured with steri-strips, cover with Adaptic or Mepitel. (DO NOT REMOVE). - *** Do not remove dressing for a  week**** Grafix PL PRIME 16mm (Disc) Bathing/ Shower/ Hygiene: May shower with protection but do not get wound dressing(s) wet. - Do Not Left Great T wet**** Skin Substitute (Grafix #4) applied 08/04/22**** GRAFIX ON oe HOLD 08/11/22 Off-Loading: Other: - No direct pressure on toes WOUND #1: - T Great Wound Laterality: Left oe Cleanser: Wound Cleanser 1 x Per Day/30 Days Discharge Instructions: Cleanse the wound with wound cleanser prior to applying a clean dressing using gauze sponges, not tissue or cotton balls. Prim Dressing: KerraCel Ag Gelling Fiber Dressing, 2x2 in (silver alginate) 1 x Per Day/30 Days ary Discharge Instructions: Apply silver alginate to wound bed as instructed Prim Dressing: Optifoam Non-Adhesive Dressing, 4x4 in 1 x Per Day/30 Days ary Discharge Instructions: Apply to Left Great toe to protect toe Prim Dressing: GRAFIX 1 x Per Day/30 Days ary Discharge Instructions: On hold 08/18/22 Secondary Dressing: Optifoam Non-Adhesive Dressing, 4x4 in 1 x Per Day/30 Days Discharge Instructions: Apply over primary dressing can use an Alleyn for padding Secondary Dressing: Woven Gauze Sponges 2x2 in 1 x Per Day/30 Days Discharge Instructions: Apply over primary dressing as directed. Secured With: Insurance underwriter, Sterile 2x75 (in/in) 1 x Per Day/30 Days Discharge Instructions: Secure with stretch gauze as directed. Secured With: Paper T ape, 1x10 (in/yd) 1 x Per Day/30 Days Discharge Instructions: Secure dressing with tape as directed. 09/01/2022: The wound is about the same. I had debrided a fair amount of periwound callus and nonviable skin last week, so it does measure a little bit larger. The bone is still exposed. No obvious signs of infection. I used a curette to debride periwound callus, senescent skin, and slough from her wound. We will contact [facility] to get the results of the x-ray she had done. For now I am going to apply silver alginate to the  wound. I think we may need to revisit the possibility of angiographic intervention. She will follow-up in 2 weeks.  Electronic Signature(s) Signed: 09/01/2022 1:35:59 PM By: Duanne Guess MD FACS Entered By: Duanne Guess on 09/01/2022 13:35:58 -------------------------------------------------------------------------------- HxROS Details Patient Name: Date of Service: [name], [name]. 09/01/2022 12:30 PM Medical Record Number: [id number] Patient Account Number: [id number] Date of Birth/Sex: Treating RN: 06/01/1931 (88 y.o. F) Primary Care Provider: SYSTEM, PRO V IDER Other Clinician: Referring Provider: Treating Provider/Extender: [name] in Treatment: 30 Cardiovascular Medical History: Positive for: Arrhythmia - AV block, complete heart block; Congestive Heart Failure; Hypertension Past Medical History Notes: Pulmonary Hypertension Endocrine Medical History: Positive for: Type II Diabetes Past Medical History Notes: Hypoglycemia Treated with: Oral agents Blood sugar tested every day: Yes Tested : 2-3x a day Musculoskeletal Medical History: Positive for: Gout; Osteoarthritis DAHLILA, PFAHLER ([id number]) [id number] Page 9 of 10 Neurologic Medical History: Past Medical History Notes: Acute encephalopathy Immunizations Pneumococcal Vaccine: Received Pneumococcal Vaccination: No Implantable Devices No devices added Family and Social History Never smoker; Marital Status - Widowed; Alcohol Use: Never; Drug Use: No History; Caffeine Use: Rarely; Financial Concerns: No; Food, Clothing or Shelter Needs: No; Support System Lacking: No; Transportation Concerns: No Electronic Signature(s) Signed: 09/01/2022 1:45:39 PM By: Duanne Guess MD FACS Entered By: Duanne Guess on 09/01/2022 13:33:39 -------------------------------------------------------------------------------- SuperBill Details Patient Name: Date of Service: [name] 09/01/2022 Medical Record Number: [id number] Patient Account Number: [id number] Date of Birth/Sex: Treating RN: May 27, 1931 (88 y.o. F) Primary Care Provider: SYSTEM, PRO V IDER Other Clinician: Referring Provider: Treating Provider/Extender: [name] in Treatment: 30 Diagnosis Coding ICD-10 Codes Code Description E11.621 Type 2 diabetes mellitus with foot ulcer L97.522 Non-pressure chronic ulcer of other part of left foot with fat layer exposed I50.32 Chronic diastolic (congestive) heart failure I27.20 Pulmonary hypertension, unspecified Facility Procedures : CPT4 Code: 40102725 Description: 97597 - DEBRIDE WOUND 1ST 20 SQ CM OR < ICD-10 Diagnosis Description L97.522 Non-pressure chronic ulcer of other part of left foot with fat layer exposed Modifier: Quantity: 1 Physician Procedures : CPT4 Code Description Modifier [id number] - WC PHYS LEVEL 3 - EST PT 25 ICD-10 Diagnosis Description L97.522 Non-pressure chronic ulcer of other part of left foot with fat layer exposed E11.621 Type 2 diabetes mellitus with foot ulcer I50.32 Chronic  diastolic (congestive) heart failure I27.20 Pulmonary hypertension, unspecified Quantity: 1 Electronic Signature(s) Signed: 09/01/2022 1:36:18 PM By: Duanne Guess MD FACS Entered By: Duanne Guess on 09/01/2022 13:36:18

## 2022-09-15 ENCOUNTER — Encounter (HOSPITAL_BASED_OUTPATIENT_CLINIC_OR_DEPARTMENT_OTHER): Payer: Medicare Other | Admitting: General Surgery

## 2022-09-15 DIAGNOSIS — E11621 Type 2 diabetes mellitus with foot ulcer: Secondary | ICD-10-CM | POA: Diagnosis not present

## 2022-09-15 NOTE — Progress Notes (Signed)
BEREA, MAJKOWSKI ([id number]) [id number] Page 1 of 7 Visit Report for 09/15/2022 Arrival Information Details Patient Name: Date of Service: [name], [name] 09/15/2022 1:15 PM Medical Record Number: [id number] Patient Account Number: [id number] Date of Birth/Sex: Treating RN: 1931/08/26 (88 y.o. [name], [name] Primary Care Jarelis Ehlert: SYSTEM, PRO V IDER Other Clinician: Referring Ramsey Guadamuz: Treating Ruston Fedora/Extender: Jadene Pierini in Treatment: 58 Visit Information History Since Last Visit Added or deleted any medications: No Patient Arrived: Wheel Chair Any new allergies or adverse reactions: No Arrival Time: 13:45 Had a fall or experienced change in No Accompanied By: son activities of daily living that may affect Transfer Assistance: None risk of falls: Patient Identification Verified: Yes Signs or symptoms of abuse/neglect since last visito No Patient Requires Transmission-Based Precautions: No Hospitalized since last visit: No Patient Has Alerts: Yes Implantable device outside of the clinic excluding No Patient Alerts: ABI not obtainable cellular tissue based products placed in the center since last visit: Has Dressing in Place as Prescribed: Yes Pain Present Now: Yes Electronic Signature(s) Signed: 09/15/2022 5:17:33 PM By: Dellie Catholic RN Entered By: Dellie Catholic on 09/15/2022 13:46:16 -------------------------------------------------------------------------------- Encounter Discharge Information Details Patient Name: Date of Service: [name], [name]. 09/15/2022 1:15 PM Medical Record Number: [id number] Patient Account Number: [id number] Date of Birth/Sex: Treating RN: 03-06-1931 (88 y.o. America Keach Primary Care Cabe Lashley: SYSTEM, PRO Ave Filter Other Clinician: Referring Calub Tarnow: Treating Hila Bolding/Extender: Jadene Pierini in Treatment: 32 Encounter Discharge Information Items Post Procedure  Vitals Discharge Condition: Stable Temperature (F): 98.6 Ambulatory Status: Wheelchair Pulse (bpm): 77 Discharge Destination: [place] Respiratory Rate (breaths/min): 18 Telephoned: No Blood Pressure (mmHg): 133/75 Orders Sent: Yes Transportation: Other Accompanied By: son and care giver Schedule Follow-up Appointment: Yes Clinical Summary of Care: Patient Declined Electronic Signature(s) Signed: 09/15/2022 5:17:33 PM By: Dellie Catholic RN Entered By: Dellie Catholic on 09/15/2022 McKittrick, Gully ([id number]) [id number] Page 2 of 7 -------------------------------------------------------------------------------- Lower Extremity Assessment Details Patient Name: Date of Service: [name], [name] 09/15/2022 1:15 PM Medical Record Number: [id number] Patient Account Number: [id number] Date of Birth/Sex: Treating RN: December 14, 1930 (88 y.o. America Jiles Primary Care Maansi Wike: SYSTEM, PRO Ave Filter Other Clinician: Referring Mitsue Peery: Treating Loranda Mastel/Extender: Jadene Pierini in Treatment: 32 Edema Assessment Assessed: [Left: No] [Right: No] [Left: Edema] [Right: :] Calf Left: Right: Point of Measurement: 26 cm From Medial Instep Ankle Left: Right: Point of Measurement: 10 cm From Medial Instep Electronic Signature(s) Signed: 09/15/2022 5:17:33 PM By: Dellie Catholic RN Entered By: Dellie Catholic on 09/15/2022 14:06:04 -------------------------------------------------------------------------------- Multi Wound Chart Details Patient Name: Date of Service: [name]. 09/15/2022 1:15 PM Medical Record Number: [id number] Patient Account Number: [id number] Date of Birth/Sex: Treating RN: Jan 21, 1931 (88 y.o. F) Primary Care Honest Vanleer: SYSTEM, PRO V IDER Other Clinician: Referring Michala Deblanc: Treating Jency Schnieders/Extender: Jadene Pierini in Treatment: 32 Vital Signs Height(in): Pulse(bpm):  61 Weight(lbs): Blood Pressure(mmHg): 133/75 Body Mass Index(BMI): Temperature(F): 98.6 Respiratory Rate(breaths/min): 18 [1:Photos:] [N/A:N/A] Left T Great oe N/A N/A Wound Location: Gradually Appeared N/A N/A Wounding Event: Diabetic Wound/Ulcer of the Lower N/A N/A Primary Etiology: Extremity Arrhythmia, Congestive Heart Failure, N/A N/A Comorbid History: Hypertension, Type II Diabetes, Gout, Osteoarthritis 09/29/2021 N/A N/A Date Acquired: 30 N/A N/A Weeks of TreatmentRAFAELLA, [name] ([id number]) [id number] Page 3 of 7 Open N/A N/A Wound Status: No N/A N/A Wound Recurrence: 0.1x0.1x0.1 N/A N/A Measurements L x W x D (cm) 0.008 N/A N/A A (cm) : rea 0.001 N/A N/A Volume (cm) :  97.20% N/A N/A % Reduction in A rea: 98.20% N/A N/A % Reduction in Volume: Grade 2 N/A N/A Classification: Small N/A N/A Exudate A mount: Serous N/A N/A Exudate Type: amber N/A N/A Exudate Color: Flat and Intact N/A N/A Wound Margin: Small (1-33%) N/A N/A Granulation A mount: Pink N/A N/A Granulation Quality: Large (67-100%) N/A N/A Necrotic A mount: Eschar, Adherent Slough N/A N/A Necrotic Tissue: Fat Layer (Subcutaneous Tissue): Yes N/A N/A Exposed Structures: Fascia: No Tendon: No Muscle: No Joint: No Bone: No Small (1-33%) N/A N/A Epithelialization: Debridement - Selective/Open Wound N/A N/A Debridement: Pre-procedure Verification/Time Out 14:05 N/A N/A Taken: Lidocaine 5% topical ointment N/A N/A Pain Control: Necrotic/Eschar, Slough N/A N/A Tissue Debrided: Non-Viable Tissue N/A N/A Level: 0.01 N/A N/A Debridement A (sq cm): rea Curette N/A N/A Instrument: Minimum N/A N/A Bleeding: Pressure N/A N/A Hemostasis A chieved: 0 N/A N/A Procedural Pain: 0 N/A N/A Post Procedural Pain: Procedure was tolerated well N/A N/A Debridement Treatment Response: 0.1x0.1x0.1 N/A N/A Post Debridement Measurements L x W x D  (cm) 0.001 N/A N/A Post Debridement Volume: (cm) Induration: No N/A N/A Periwound Skin Texture: No Abnormalities Noted N/A N/A Periwound Skin Moisture: No Abnormality N/A N/A Temperature: Cellular or Tissue Based Product N/A N/A Procedures Performed: Debridement Treatment Notes Electronic Signature(s) Signed: 09/15/2022 2:47:23 PM By: Fredirick Maudlin MD FACS Entered By: Fredirick Maudlin on 09/15/2022 14:47:22 -------------------------------------------------------------------------------- Multi-Disciplinary Care Plan Details Patient Name: Date of Service: [name]. 09/15/2022 1:15 PM Medical Record Number: [id number] Patient Account Number: [id number] Date of Birth/Sex: Treating RN: 1931/06/08 (88 y.o. America Mcroy Primary Care Ludella Pranger: SYSTEM, PRO Ave Filter Other Clinician: Referring Kensington Rios: Treating Marieclaire Bettenhausen/Extender: Jadene Pierini in Treatment: [place] reviewed with physician Active Inactive Nutrition Nursing Diagnoses: Impaired glucose control: actual or potential Potential for alteratiion in Nutrition/Potential for imbalanced nutrition [name], [name] ([id number]) [id number] Page 4 of 7 Goals: Patient/caregiver will maintain therapeutic glucose control Date Initiated: 02/01/2022 Target Resolution Date: 10/20/2022 Goal Status: Active Interventions: Assess HgA1c results as ordered upon admission and as needed Assess patient nutrition upon admission and as needed per policy Provide education on elevated blood sugars and impact on wound healing Provide education on nutrition Treatment Activities: Education provided on Nutrition : 02/01/2022 Notes: Wound/Skin Impairment Nursing Diagnoses: Impaired tissue integrity Knowledge deficit related to ulceration/compromised skin integrity Goals: Patient/caregiver will verbalize understanding of skin care regimen Date Initiated: 02/01/2022 Date  Inactivated: 05/18/2022 Target Resolution Date: 04/28/2022 Goal Status: Met Ulcer/skin breakdown will have a volume reduction of 30% by week 4 Date Initiated: 02/01/2022 Date Inactivated: 03/24/2022 Target Resolution Date: 03/31/2022 Goal Status: Unmet Unmet Reason: PAD Ulcer/skin breakdown will have a volume reduction of 50% by week 8 Date Initiated: 04/06/2022 Target Resolution Date: 10/20/2022 Goal Status: Active Interventions: Assess patient/caregiver ability to obtain necessary supplies Assess patient/caregiver ability to perform ulcer/skin care regimen upon admission and as needed Assess ulceration(s) every visit Provide education on ulcer and skin care Notes: Electronic Signature(s) Signed: 09/15/2022 5:17:33 PM By: Dellie Catholic RN Entered By: Dellie Catholic on 09/15/2022 14:38:02 -------------------------------------------------------------------------------- Pain Assessment Details Patient Name: Date of Service: [name], [name] 09/15/2022 1:15 PM Medical Record Number: [id number] Patient Account Number: [id number] Date of Birth/Sex: Treating RN: Jan 28, 1931 (88 y.o. America Vallie Primary Care Samuell Knoble: SYSTEM, PRO Ave Filter Other Clinician: Referring Ukiah Trawick: Treating Daeshawn Redmann/Extender: Jadene Pierini in Treatment: 32 Active Problems Location of Pain Severity and Description of Pain Patient Has Paino Yes Site Locations Pain Location: Pfund, Merlene  S ([id number]) [id number] Page 5 of 7 Pain Location: Generalized Pain With Dressing Change: Yes Duration of the Pain. Constant / Intermittento Constant Rate the pain. Current Pain Level: 5 Worst Pain Level: 10 Least Pain Level: 5 Tolerable Pain Level: 5 Character of Pain Describe the Pain: Difficult to Pinpoint Pain Management and Medication Current Pain Management: Medication: Yes Cold Application: No Rest: Yes Massage: No Activity: No T.E.N.S.: No Heat Application:  No Leg drop or elevation: No Is the Current Pain Management Adequate: Adequate How does your wound impact your activities of daily livingo Sleep: No Bathing: No Appetite: No Relationship With Others: No Bladder Continence: No Emotions: No Bowel Continence: No Work: No Toileting: No Drive: No Dressing: No Hobbies: No Electronic Signature(s) Signed: 09/15/2022 5:17:33 PM By: Dellie Catholic RN Entered By: Dellie Catholic on 09/15/2022 14:05:56 -------------------------------------------------------------------------------- Patient/Caregiver Education Details Patient Name: Date of Service: [name] 10/26/2023andnbsp1:15 PM Medical Record Number: [id number] Patient Account Number: [id number] Date of Birth/Gender: Treating RN: [birth year] (88 y.o. America Riddles Primary Care Physician: SYSTEM, PRO V IDER Other Clinician: Referring Physician: Treating Physician/Extender: Jadene Pierini in Treatment: 8 Education Assessment Education Provided To: Patient Education Topics Provided Wound/Skin Impairment: Methods: Explain/Verbal Responses: Return demonstration correctly Electronic Signature(s) Signed: 09/15/2022 5:17:33 PM By: Dellie Catholic RN [name], [name] ([id number]) [id number] Page 6 of 7 Entered By: Dellie Catholic on 09/15/2022 14:38:23 -------------------------------------------------------------------------------- Wound Assessment Details Patient Name: Date of Service: [name], [name] 09/15/2022 1:15 PM Medical Record Number: [id number] Patient Account Number: [id number] Date of Birth/Sex: Treating RN: [birth year] (88 y.o. America Rankin Primary Care Saxon Barich: SYSTEM, PRO V IDER Other Clinician: Referring Zaray Gatchel: Treating Quinta Eimer/Extender: Jadene Pierini in Treatment: 32 Wound Status Wound Number: 1 Primary Diabetic Wound/Ulcer of the Lower Extremity Etiology: Wound Location: Left T  Great oe Wound Open Wounding Event: Gradually Appeared Status: Date Acquired: 09/29/2021 Comorbid Arrhythmia, Congestive Heart Failure, Hypertension, Type II Weeks Of Treatment: 32 History: Diabetes, Gout, Osteoarthritis Clustered Wound: No Photos Wound Measurements Length: (cm) 0.1 Width: (cm) 0.1 Depth: (cm) 0.1 Area: (cm) 0.008 Volume: (cm) 0.001 % Reduction in Area: 97.2% % Reduction in Volume: 98.2% Epithelialization: Small (1-33%) Tunneling: No Undermining: No Wound Description Classification: Grade 2 Wound Margin: Flat and Intact Exudate Amount: Small Exudate Type: Serous Exudate Color: amber Foul Odor After Cleansing: No Slough/Fibrino Yes Wound Bed Granulation Amount: Small (1-33%) Exposed Structure Granulation Quality: Pink Fascia Exposed: No Necrotic Amount: Large (67-100%) Fat Layer (Subcutaneous Tissue) Exposed: Yes Necrotic Quality: Eschar, Adherent Slough Tendon Exposed: No Muscle Exposed: No Joint Exposed: No Bone Exposed: No Periwound Skin Texture Texture Color No Abnormalities Noted: No No Abnormalities Noted: No Induration: No Temperature / Pain Temperature: No Abnormality Moisture No Abnormalities Noted: Yes Treatment Notes PATRECIA, VEIGA ([id number]) [id number] Page 7 of 7 Wound #1 (Toe Great) Wound Laterality: Left Cleanser Wound Cleanser Discharge Instruction: Cleanse the wound with wound cleanser prior to applying a clean dressing using gauze sponges, not tissue or cotton balls. Peri-Wound Care Topical Primary Dressing KerraCel Ag Gelling Fiber Dressing, 2x2 in (silver alginate) Discharge Instruction: on hold when Grafix applied Optifoam Non-Adhesive Dressing, 4x4 in Discharge Instruction: Apply to Left Great toe to protect toe [place] Discharge Instruction: On 09/15/22 Do not remove,Next [facility] 09/29/22 Secondary Dressing Optifoam Non-Adhesive Dressing, 4x4 in Discharge Instruction: Apply over primary  dressing can use an Alleyn for padding Woven Gauze Sponges 2x2 in Discharge Instruction: Apply over primary dressing as directed. Secured With Conforming Stretch Gauze Bandage, Sterile 2x75 (in/in)  Discharge Instruction: Secure with stretch gauze as directed. Paper Tape, 1x10 (in/yd) Discharge Instruction: Secure dressing with tape as directed. Compression Wrap Compression Stockings Add-Ons Electronic Signature(s) Signed: 09/15/2022 5:17:33 PM By: Dellie Catholic RN Entered By: Dellie Catholic on 09/15/2022 13:51:21 -------------------------------------------------------------------------------- Vitals Details Patient Name: Date of Service: [name], [name]. 09/15/2022 1:15 PM Medical Record Number: [id number] Patient Account Number: [id number] Date of Birth/Sex: Treating RN: [birth year] (88 y.o. America Higby Primary Care Laketta Soderberg: SYSTEM, PRO V IDER Other Clinician: Referring Emilia Kayes: Treating Dung Prien/Extender: Jadene Pierini in Treatment: 32 Vital Signs Time Taken: 13:45 Temperature (F): 98.6 Pulse (bpm): 77 Respiratory Rate (breaths/min): 18 Blood Pressure (mmHg): 133/75 Reference Range: 80 - 120 mg / dl Electronic Signature(s) Signed: 09/15/2022 5:17:33 PM By: Dellie Catholic RN Entered By: Dellie Catholic on 09/15/2022 13:47:17

## 2022-09-16 NOTE — Progress Notes (Signed)
[name], [name] ([id number]) [id number] Page 1 of 10 Visit Report for 09/15/2022 Chief Complaint Document Details Patient Name: Date of Service: [name], [name] 09/15/2022 1:15 PM Medical Record Number: [id number] Patient Account Number: [id number] Date of Birth/Sex: Treating RN: [birth year] (88 y.o. F) Primary Care Provider: SYSTEM, PRO V IDER Other Clinician: Referring Provider: Treating Provider/Extender: Jadene Pierini in Treatment: 32 Information Obtained from: Patient Chief Complaint Patient presents to the wound care center with open non-healing surgical wound(s) Electronic Signature(s) Signed: 09/15/2022 2:47:29 PM By: Fredirick Maudlin MD FACS Entered By: Fredirick Maudlin on 09/15/2022 14:47:29 -------------------------------------------------------------------------------- Cellular or Tissue Based Product Details Patient Name: Date of Service: [name], [name] 09/15/2022 1:15 PM Medical Record Number: [id number] Patient Account Number: [id number] Date of Birth/Sex: Treating RN: 1930-12-19 (88 y.o. America Schowalter Primary Care Provider: SYSTEM, PRO Ave Filter Other Clinician: Referring Provider: Treating Provider/Extender: Jadene Pierini in Treatment: 32 Cellular or Tissue Based Product Type Wound #1 Left T Great oe Applied to: Performed By: Physician Fredirick Maudlin, MD Cellular or Tissue Based Product Type: Grafix prime Level of Consciousness (Pre-procedure): Awake and Alert Pre-procedure Verification/Time Out Yes - 14:05 Taken: Location: genitalia / hands / feet / multiple digits Wound Size (sq cm): 0.01 Product Size (sq cm): 2 Waste Size (sq cm): 0 Amount of Product Applied (sq cm): 2 Instrument Used: Forceps, Scissors Lot #: [id number] Order #: 5 Expiration Date: 12/11/2023 Fenestrated: No Reconstituted: Yes Solution Type: NS Solution Amount: 1 Lot #: [id number] Solution Expiration Date: 03/20/2025 Secured:  Yes Secured With: Steri-Strips Dressing Applied: Yes Primary Dressing: Adaptic;gauze Procedural Pain: 0 Post Procedural Pain: 0 Response to Treatment: Procedure was tolerated well Level of Consciousness (Post- Awake and 1 [name], [name] ([id number]) [id number] Page 2 of 10 procedure): Post Procedure Diagnosis Same as Pre-procedure Notes Grafix PL Prime (23m) 2 cm squared Scribed for Dr. CCeline Ahrby J.Scotton Electronic Signature(s) Signed: 09/15/2022 5:17:33 PM By: SDellie CatholicRN Signed: 09/16/2022 8:01:11 AM By: CFredirick MaudlinMD FACS Entered By: SDellie Catholicon 09/15/2022 17:14:46 -------------------------------------------------------------------------------- Debridement Details Patient Name: Date of Service: [name] 09/15/2022 1:15 PM Medical Record Number: [id number] Account Number: [id number] of Birth/Sex: Treating RN: 108/06/1931(88y.o. FAmerica BrownPrimary Care Provider: SYSTEM, PRO V IDER Other Clinician: Referring Provider: Treating Provider/Extender: CJadene Pieriniin Treatment: 32 Debridement Performed for Assessment: Wound #1 Left T Great oe Performed By: Physician CFredirick Maudlin MD Debridement Type: Debridement Severity of Tissue Pre Debridement: Fat layer exposed Level of Consciousness (Pre-procedure): Awake and Alert Pre-procedure Verification/Time Out Yes - 14:05 Taken: Start Time: 14:05 Pain Control: Lidocaine 5% topical ointment T Area Debrided (L x W): otal 0.1 (cm) x 0.1 (cm) = 0.01 (cm) Tissue and other material debrided: Non-Viable, Eschar, Slough, Slough Level: Non-Viable Tissue Debridement Description: Selective/Open Wound Instrument: Curette Bleeding: Minimum Hemostasis Achieved: Pressure End Time: 14:06 Procedural Pain: 0 Post Procedural Pain: 0 Response to Treatment: Procedure was tolerated well Level of Consciousness (Post- Awake and  Alert procedure): Post Debridement Measurements of Total Wound Length: (cm) 0.1 Width: (cm) 0.1 Depth: (cm) 0.1 Volume: (cm) 0.001 Character of Wound/Ulcer Post Debridement: Improved Severity of Tissue Post Debridement: Fat layer exposed Post Procedure Diagnosis Same as Pre-procedure Electronic Signature(s) Signed: 09/15/2022 5:17:33 PM By: SDellie CatholicRN Signed: 09/16/2022 8:01:11 AM By: CFredirick MaudlinMD FACS Entered By: SDellie Catholicon 09/15/2022 14:10:22 BAlinda [name]([id number] Page 3 of 10 -------------------------------------------------------------------------------- HPI Details Patient Name: Date of Service: [name],  [name] 09/15/2022 1:15 PM Medical Record Number: [id number] Patient Account Number: [id number] Date of Birth/Sex: Treating RN: 09-23-1931 (88 y.o. F) Primary Care Provider: SYSTEM, PRO V IDER Other Clinician: Referring Provider: Treating Provider/Extender: Jadene Pierini in Treatment: 32 History of Present Illness HPI Description: ADMISSION 02/01/2022 This is an 88 year old woman who is a resident of [facility]. She has a past medical history notable for type 2 diabetes mellitus, congestive heart failure, pulmonary hypertension, and complete heart block. She is accompanied today by her son. Approximately 4 months ago, she had an ingrown toe nail removed. Apparently this went a bit deep. Since that time, it has not healed. The patient is nonambulatory. Apparently they have been applying Santyl to the site at her facility. She was on antibiotics but I am not certain which specific drugs she took. She completed her last course a few weeks ago. We were unable to obtain ABIs in clinic secondary to the patient's body habitus. She has not had any formal vascular studies nor any x-ray of the area. 02/09/2022: The wound is roughly the same size today, but the amount of slough in the base has decreased  significantly. She apparently had an x-ray done at her facility, but we do not have the report; per verbal report from facility staff, "there was nothing there." We had requested formal ABI/TBI studies, but these were not done. She has been in [place] under [employer]. 02/16/2022: The wound measured about the same today, but to my eye, it appears rather smaller. The slough in the base is minimal. Her vascular studies are scheduled for Friday. We have been using Santyl under Hydrofera Blue. No concern for infection. 02/23/2022: Once again, the wound has been measured about the same size, but I think it looks a bit smaller. There is slough at the base. She had her ABIs done last week and these show severe bilateral peripheral arterial disease. We received the report of the foot x-ray that was ordered. There is no concern for osteomyelitis at this time. 03/02/2022: The wound is smaller today with a small amount of slough at the base. We placed a referral to vein and vascular surgery last week in response to her severe peripheral arterial disease, but she does not yet have an appointment on the books. 03/16/2022: She saw Dr. Stanford Breed in the vascular surgery department yesterday. He did not think she was a good candidate for any revascularization options and suggested that she continue local wound care for another month. If she fails to heal in that time, they may consider an angiogram with potential intervention. The wound today is roughly the same size but there is less exposed bone at the base and the surface is clean without any substantial slough. 03/24/2022: The wound appears about the same. There is some periwound callus that has heaped up. We changed her to Prisma silver collagen last week. 04/06/2022: No significant change to the wound. It is neither improving nor deteriorating. She continues to accumulate some periwound callus around the margin. Bone is exposed at the base. 04/20/2022: The wound has  improved quite a bit over the past 2 weeks. It is smaller and there is no real accumulation of periwound callus. She had follow-up with Dr. Stanford Breed yesterday and he felt that the wound was progressing well enough that no arterial intervention was necessary. 05/18/2022: The patient has been absent due to a fall from her wheelchair during her last transportation that was supposed to be to her clinic. Today,  the wound is about the same size. There is still bone exposed at the base. There is periwound callus accumulation. No concern for infection. 06/01/2022: The wound is actually a little bit smaller today and the surface is not as pale. She still has accumulated periwound callus. 06/15/2022: The wound continues to contract, albeit slowly. No concern for infection. 06/28/2022: The wound is slightly smaller again today. She continues to accumulate callus and there is some periwound epibole at the margins. No concern for gross infection. 07/11/2022: The wound is roughly the same size with just a small opening in the center. She has been approved for Grafix. 07/18/2022: The wound has contracted somewhat and the surface has a healthier pink look to it. We applied Grafix last week and she is here for application #2. 03/25/7321: The wound is about the same size but the surface continues to improve and appear more vital. She is here for Grafix application #3. 0/25/4270: There is still a stubborn central portion of her toe wound that is not closing in yet. The bone remains exposed. She is here for Grafix application #4. 05/14/7627: Today when I debrided the eschar and callus from her wound, purulent drainage was expressed from her wound. The underlying surface is unchanged with bone exposed. She has been having more pain in the toe and this may explain why. 08/18/2022: The culture that I took last week grew out Candida and Acinetobacter. We changed her dressing regimen to terbinafine instead of gentamicin and I prescribed  levofloxacin instead of Augmentin. T oday, the wound does not look that much different. It remains small with granulation tissue at the base, but bone remains exposed. 09/01/2022: The wound is about the same. I had debrided a fair amount of periwound callus and nonviable skin last week, so it does measure a little bit larger. The bone is still exposed. No obvious signs of infection. I requested a plain x-ray of the patient's foot a month ago and still have not received the results of that study, performed at her nursing facility. She continues to complain of pain in her toe and I am concerned that she has osteomyelitis. Based upon her previous ABIs, she does not have adequate blood flow to heal an amputation and she has failed to make much progress in terms of her wound healing. 09/15/2022: I did receive the results of the x-ray, but no actual images. No osteomyelitis was identified on the film. We referred her back to vascular surgery, but she does not yet have an appointment. The wound is unchanged. [name], [name] ([id number]) [id number] Page 4 of 10 Electronic Signature(s) Signed: 09/15/2022 2:48:18 PM By: Fredirick Maudlin MD FACS Entered By: Fredirick Maudlin on 09/15/2022 14:48:18 -------------------------------------------------------------------------------- Physical Exam Details Patient Name: Date of Service: [name] 09/15/2022 1:15 PM Medical Record Number: [id number] Patient Account Number: [id number] Date of Birth/Sex: Treating RN: 09-15-1931 (88 y.o. F) Primary Care Provider: SYSTEM, PRO V IDER Other Clinician: Referring Provider: Treating Provider/Extender: Jadene Pierini in Treatment: 32 Constitutional . . . . No acute distress.[name] Respiratory Normal work of breathing on room air.. Notes 09/15/2022: No change to the wound. Electronic Signature(s) Signed: 09/15/2022 2:48:54 PM By: Fredirick Maudlin MD FACS Entered By: Fredirick Maudlin on 09/15/2022 14:48:53 -------------------------------------------------------------------------------- Physician Orders Details Patient Name: Date of Service: [name], [name] 09/15/2022 1:15 PM Medical Record Number: [id number] Patient Account Number: [id number] Date of Birth/Sex: Treating RN: January 14, 1931 (88 y.o. F) Scotton, [name] Primary Care Provider: SYSTEM, PRO V IDER  Other Clinician: Referring Provider: Treating Provider/Extender: Jadene Pierini in Treatment: 32 Verbal / Phone Orders: No Diagnosis Coding ICD-10 Coding Code Description E11.621 Type 2 diabetes mellitus with foot ulcer L97.522 Non-pressure chronic ulcer of other part of left foot with fat layer exposed I20.35 Chronic diastolic (congestive) heart failure I27.20 Pulmonary hypertension, unspecified Follow-up Appointments ppointment in 2 weeks. - Dr. Celine Ahr Room 3 Nov. 9th at 1:15pm Return A Other: - Grafix #5 09/15/22 Anesthetic (In clinic) Topical Lidocaine 4% applied to wound bed - In Clinic Cellular or Tissue Based Products Wound #1 Left T Great oe daptic or Mepitel. (DO NOT REMOVE). - *** Cellular or Tissue Based Product applied to wound bed, secured with steri-strips, cover with A Do not remove dressing for 2 weeks**** Skin Sub Grafix PL PRIME 62m (Disc) BMARIBETH, [name]([id number] Page 5 of 10 Bathing/ Shower/ Hygiene May shower with protection but do not get wound dressing(s) wet. - Do Not Left Great T wet**** Skin Substitute (Grafix #5) applied 09/15/22 oe Off-Loading Other: - No direct pressure on toes Wound Treatment Wound #1 - T Great oe Wound Laterality: Left Cleanser: Wound Cleanser Every Other Day/30 Days Discharge Instructions: Cleanse the wound with wound cleanser prior to applying a clean dressing using gauze sponges, not tissue or cotton balls. Prim Dressing: KerraCel Ag Gelling Fiber Dressing, 2x2 in (silver alginate) ary  Every Other Day/30 Days Discharge Instructions: on hold when Grafix applied Prim Dressing: Optifoam Non-Adhesive Dressing, 4x4 in Every Other Day/30 Days ary Discharge Instructions: Apply to Left Great toe to protect toe Prim Dressing: GRAFIX Every Other Day/30 Days ary Discharge Instructions: On 09/15/22 [facility] not remove,[facility] Secondary Dressing: Optifoam Non-Adhesive Dressing, 4x4 in Every Other Day/30 Days Discharge Instructions: Apply over primary dressing can use an Alleyn for padding Secondary Dressing: Woven Gauze Sponges 2x2 in Every Other Day/30 Days Discharge Instructions: Apply over primary dressing as directed. Secured With: CChild psychotherapist Sterile 2x75 (in/in) Every Other Day/30 Days Discharge Instructions: Secure with stretch gauze as directed. Secured With: Paper Tape, 1x10 (in/yd) Every Other Day/30 Days Discharge Instructions: Secure dressing with tape as directed. Electronic Signature(s) Signed: 09/15/2022 5:17:33 PM By: SDellie CatholicRN Signed: 09/16/2022 8:01:11 AM By: CFredirick MaudlinMD FACS Entered By: SDellie Catholicon 09/15/2022 153:64:68-------------------------------------------------------------------------------- Problem List Details Patient Name: Date of Service: BSherrill [name] MBETZAIDA CREMEENS10/26/2023 1:15 PM Medical Record Number: [id number] Account Number: [id number] of Birth/Sex: Treating RN: 117-Oct-1932(88y.o. F) Primary Care Provider: SYSTEM, PRO V IDER Other Clinician: Referring Provider: Treating Provider/Extender: CJadene Pieriniin Treatment: 32 Active Problems ICD-10 Encounter Code Description Active Date MDM Diagnosis E11.621 Type 2 diabetes mellitus with foot ulcer 02/01/2022 No Yes L97.522 Non-pressure chronic ulcer of other part of left foot with fat layer exposed 02/01/2022 No Yes IN00.37Chronic diastolic (congestive) heart failure 02/01/2022 No Yes I27.20 Pulmonary hypertension, unspecified  02/01/2022 No Yes BAUNDREA, HORACE([id number] Page 6 of 10 Inactive Problems Resolved Problems Electronic Signature(s) Signed: 09/15/2022 2:47:17 PM By: CFredirick MaudlinMD FACS Entered By: CFredirick Maudlinon 09/15/2022 14:47:17 -------------------------------------------------------------------------------- Progress Note Details Patient Name: Date of Service: [name] 09/15/2022 1:15 PM Medical Record Number: [id number] Account Number: [id number] of Birth/Sex: Treating RN: 1August 11, 1932(88y.o. F) Primary Care Provider: SYSTEM, PRO V IDER Other Clinician: Referring Provider: Treating Provider/Extender: CJadene Pieriniin Treatment: 32 Subjective Chief Complaint Information obtained from Patient Patient presents to the wound care center with open non-healing  surgical wound(s) History of Present Illness (HPI) ADMISSION 02/01/2022 This is an 88 year old woman who is a resident of [place]. She has a past medical history notable for type 2 diabetes mellitus, congestive heart failure, pulmonary hypertension, and complete heart block. She is accompanied today by her son. Approximately 4 months ago, she had an ingrown toe nail removed. Apparently this went a bit deep. Since that time, it has not healed. The patient is nonambulatory. Apparently they have been applying Santyl to the site at her facility. She was on antibiotics but I am not certain which specific drugs she took. She completed her last course a few weeks ago. We were unable to obtain ABIs in clinic secondary to the patient's body habitus. She has not had any formal vascular studies nor any x-ray of the area. 02/09/2022: The wound is roughly the same size today, but the amount of slough in the base has decreased significantly. She apparently had an x-ray done at her facility, but we do not have the report; per verbal report from facility staff, "there was  nothing there." We had requested formal ABI/TBI studies, but these were not done. She has been in [place] under [employer]. 02/16/2022: The wound measured about the same today, but to my eye, it appears rather smaller. The slough in the base is minimal. Her vascular studies are scheduled for Friday. We have been using Santyl under Hydrofera Blue. No concern for infection. 02/23/2022: Once again, the wound has been measured about the same size, but I think it looks a bit smaller. There is slough at the base. She had her ABIs done last week and these show severe bilateral peripheral arterial disease. We received the report of the foot x-ray that was ordered. There is no concern for osteomyelitis at this time. 03/02/2022: The wound is smaller today with a small amount of slough at the base. We placed a referral to vein and vascular surgery last week in response to her severe peripheral arterial disease, but she does not yet have an appointment on the books. 03/16/2022: She saw Dr. Stanford Breed in the vascular surgery department yesterday. He did not think she was a good candidate for any revascularization options and suggested that she continue local wound care for another month. If she fails to heal in that time, they may consider an angiogram with potential intervention. The wound today is roughly the same size but there is less exposed bone at the base and the surface is clean without any substantial slough. 03/24/2022: The wound appears about the same. There is some periwound callus that has heaped up. We changed her to Prisma silver collagen last week. 04/06/2022: No significant change to the wound. It is neither improving nor deteriorating. She continues to accumulate some periwound callus around the margin. Bone is exposed at the base. 04/20/2022: The wound has improved quite a bit over the past 2 weeks. It is smaller and there is no real accumulation of periwound callus. She had follow-up with Dr. Stanford Breed  yesterday and he felt that the wound was progressing well enough that no arterial intervention was necessary. 05/18/2022: The patient has been absent due to a fall from her wheelchair during her last transportation that was supposed to be to her clinic. Today, the wound is about the same size. There is still bone exposed at the base. There is periwound callus accumulation. No concern for infection. 06/01/2022: The wound is actually a little bit smaller today and the surface is not as  pale. She still has accumulated periwound callus. 06/15/2022: The wound continues to contract, albeit slowly. No concern for infection. 06/28/2022: The wound is slightly smaller again today. She continues to accumulate callus and there is some periwound epibole at the margins. No concern for gross infection. 07/11/2022: The wound is roughly the same size with just a small opening in the center. She has been approved for Grafix. 07/18/2022: The wound has contracted somewhat and the surface has a healthier pink look to it. We applied Grafix last week and she is here for application #2. [name], [name] ([id number]) [id number] Page 7 of 10 07/28/2022: The wound is about the same size but the surface continues to improve and appear more vital. She is here for Grafix application #3. 02/27/8118: There is still a stubborn central portion of her toe wound that is not closing in yet. The bone remains exposed. She is here for Grafix application #4. 1/47/8295: Today when I debrided the eschar and callus from her wound, purulent drainage was expressed from her wound. The underlying surface is unchanged with bone exposed. She has been having more pain in the toe and this may explain why. 08/18/2022: The culture that I took last week grew out Candida and Acinetobacter. We changed her dressing regimen to terbinafine instead of gentamicin and I prescribed levofloxacin instead of Augmentin. T oday, the wound does not look  that much different. It remains small with granulation tissue at the base, but bone remains exposed. 09/01/2022: The wound is about the same. I had debrided a fair amount of periwound callus and nonviable skin last week, so it does measure a little bit larger. The bone is still exposed. No obvious signs of infection. I requested a plain x-ray of the patient's foot a month ago and still have not received the results of that study, performed at her nursing facility. She continues to complain of pain in her toe and I am concerned that she has osteomyelitis. Based upon her previous ABIs, she does not have adequate blood flow to heal an amputation and she has failed to make much progress in terms of her wound healing. 09/15/2022: I did receive the results of the x-ray, but no actual images. No osteomyelitis was identified on the film. We referred her back to vascular surgery, but she does not yet have an appointment. The wound is unchanged. Patient History Social History Never smoker, Marital Status - Widowed, Alcohol Use - Never, Drug Use - No History, Caffeine Use - Rarely. Medical History Cardiovascular Patient has history of Arrhythmia - AV block, complete heart block, Congestive Heart Failure, Hypertension Endocrine Patient has history of Type II Diabetes Musculoskeletal Patient has history of Gout, Osteoarthritis Medical A Surgical History Notes nd Cardiovascular Pulmonary Hypertension Endocrine Hypoglycemia Neurologic Acute encephalopathy Objective Constitutional No acute distress.. Vitals Time Taken: 1:45 PM, Temperature: 98.6 F, Pulse: 77 bpm, Respiratory Rate: 18 breaths/min, Blood Pressure: 133/75 mmHg. Respiratory Normal work of breathing on room air.. General Notes: 09/15/2022: No change to the wound. Integumentary (Hair, Skin) Wound #1 status is Open. Original cause of wound was Gradually Appeared. The date acquired was: 09/29/2021. The wound has been in treatment 32  weeks. The wound is located on the Left T Great. The wound measures 0.1cm length x 0.1cm width x 0.1cm depth; 0.008cm^2 area and 0.001cm^3 volume. There is Fat oe Layer (Subcutaneous Tissue) exposed. There is no tunneling or undermining noted. There is a small amount of serous drainage noted. The wound margin is flat and  intact. There is small (1-33%) pink granulation within the wound bed. There is a large (67-100%) amount of necrotic tissue within the wound bed including Eschar and Adherent Slough. The periwound skin appearance had no abnormalities noted for moisture. The periwound skin appearance did not exhibit: Induration. Periwound temperature was noted as No Abnormality. Assessment Active Problems ICD-10 Type 2 diabetes mellitus with foot ulcer Non-pressure chronic ulcer of other part of left foot with fat layer exposed Chronic diastolic (congestive) heart failure Pulmonary hypertension, unspecified [name], [name] ([id number]) [id number] Page 8 of 10 Procedures Wound #1 Pre-procedure diagnosis of Wound #1 is a Diabetic Wound/Ulcer of the Lower Extremity located on the Left T Great .Severity of Tissue Pre Debridement is: oe Fat layer exposed. There was a Selective/Open Wound Non-Viable Tissue Debridement with a total area of 0.01 sq cm performed by Fredirick Maudlin, MD. With the following instrument(s): Curette to remove Non-Viable tissue/material. Material removed includes Eschar and Slough and after achieving pain control using Lidocaine 5% topical ointment. No specimens were taken. A time out was conducted at 14:05, prior to the start of the procedure. A Minimum amount of bleeding was controlled with Pressure. The procedure was tolerated well with a pain level of 0 throughout and a pain level of 0 following the procedure. Post Debridement Measurements: 0.1cm length x 0.1cm width x 0.1cm depth; 0.001cm^3 volume. Character of Wound/Ulcer Post Debridement is  improved. Severity of Tissue Post Debridement is: Fat layer exposed. Post procedure Diagnosis Wound #1: Same as Pre-Procedure Pre-procedure diagnosis of Wound #1 is a Diabetic Wound/Ulcer of the Lower Extremity located on the Left T Great. A skin graft procedure using a oe bioengineered skin substitute/cellular or tissue based product was performed by Fredirick Maudlin, MD with the following instrument(s): Forceps and Scissors. Grafix prime was applied and secured with Steri-Strips. 2 sq cm of product was utilized and 0 sq cm was wasted. Post Application, Adaptic;gauze was applied. A Time Out was conducted at 14:05, prior to the start of the procedure. The procedure was tolerated well with a pain level of 0 throughout and a pain level of 0 following the procedure. Post procedure Diagnosis Wound #1: Same as Pre-Procedure General Notes: Grafix PL Prime (62m) 2 cm squared Scribed for Dr. CCeline Ahrby J.Scotton. Plan Follow-up Appointments: Return Appointment in 2 weeks. - Dr. CCeline AhrRoom 3 Nov. 9th at 1:15pm Other: - Grafix #5 09/15/22 Anesthetic: (In clinic) Topical Lidocaine 4% applied to wound bed - In Clinic Cellular or Tissue Based Products: Wound #1 Left T Great: oe Cellular or Tissue Based Product applied to wound bed, secured with steri-strips, cover with Adaptic or Mepitel. (DO NOT REMOVE). - *** Do not remove dressing for 2 weeks**** Skin Sub Grafix PL PRIME 150m(Disc) Bathing/ Shower/ Hygiene: May shower with protection but do not get wound dressing(s) wet. - Do Not Left Great T wet**** Skin Substitute (Grafix #5) applied 09/15/22 oe Off-Loading: Other: - No direct pressure on toes WOUND #1: - T Great Wound Laterality: Left oe Cleanser: Wound Cleanser Every Other Day/30 Days Discharge Instructions: Cleanse the wound with wound cleanser prior to applying a clean dressing using gauze sponges, not tissue or cotton balls. Prim Dressing: KerraCel Ag Gelling Fiber Dressing, 2x2 in  (silver alginate) Every Other Day/30 Days ary Discharge Instructions: on hold when Grafix applied Prim Dressing: Optifoam Non-Adhesive Dressing, 4x4 in Every Other Day/30 Days ary Discharge Instructions: Apply to Left Great toe to protect toe Prim Dressing: GRAFIX Every Other Day/30 Days ary Discharge  Instructions: On 09/15/22 Do not remove,Next [facility] 09/29/22 Secondary Dressing: Optifoam Non-Adhesive Dressing, 4x4 in Every Other Day/30 Days Discharge Instructions: Apply over primary dressing can use an Alleyn for padding Secondary Dressing: Woven Gauze Sponges 2x2 in Every Other Day/30 Days Discharge Instructions: Apply over primary dressing as directed. Secured With: Child psychotherapist, Sterile 2x75 (in/in) Every Other Day/30 Days Discharge Instructions: Secure with stretch gauze as directed. Secured With: Paper T ape, 1x10 (in/yd) Every Other Day/30 Days Discharge Instructions: Secure dressing with tape as directed. 09/15/2022: No significant change to the wound. I used a curette to debride eschar and slough from her wound. I then applied Grafix prime and moistened it with saline. It was secured in situ with Adaptic and Steri-Strips. A gauze sponge was used as a bolster. I really think that without better blood flow to the site, we have little to no hope of it actually healing. When she first met with vascular surgery, they did want to see if we could potentially heal her without any intervention on their end, given that her medical comorbidities are not insignificant. I think we have reached the limit of what I am able to do without good blood flow and I really would like her to be seen to discuss potential for catheter-based intervention; I doubt she is a suitable surgical candidate for an open procedure. She will follow-up here in 2 weeks. Electronic Signature(s) Signed: 09/15/2022 2:51:49 PM By: Fredirick Maudlin MD FACS Entered By: Fredirick Maudlin on 09/15/2022  14:51:49 [name] ([id number]) [id number] Page 9 of 10 -------------------------------------------------------------------------------- HxROS Details Patient Name: Date of Service: [name], [name] 09/15/2022 1:15 PM Medical Record Number: [id number] Patient Account Number: [id number] Date of Birth/Sex: Treating RN: 22-Aug-1931 (88 y.o. F) Primary Care Provider: SYSTEM, PRO V IDER Other Clinician: Referring Provider: Treating Provider/Extender: Jadene Pierini in Treatment: 32 Cardiovascular Medical History: Positive for: Arrhythmia - AV block, complete heart block; Congestive Heart Failure; Hypertension Past Medical History Notes: Pulmonary Hypertension Endocrine Medical History: Positive for: Type II Diabetes Past Medical History Notes: Hypoglycemia Treated with: Oral agents Blood sugar tested every day: Yes Tested : 2-3x a day Musculoskeletal Medical History: Positive for: Gout; Osteoarthritis Neurologic Medical History: Past Medical History Notes: Acute encephalopathy Immunizations Pneumococcal Vaccine: Received Pneumococcal Vaccination: No Implantable Devices No devices added Family and Social History Never smoker; Marital Status - Widowed; Alcohol Use: Never; Drug Use: No History; Caffeine Use: Rarely; Financial Concerns: No; Food, Clothing or Shelter Needs: No; Support System Lacking: No; Transportation Concerns: No Electronic Signature(s) Signed: 09/16/2022 8:01:11 AM By: Fredirick Maudlin MD FACS Entered By: Fredirick Maudlin on 09/15/2022 14:48:23 -------------------------------------------------------------------------------- SuperBill Details Patient Name: Date of Service: [name] 09/15/2022 Medical Record Number: [id number] Patient Account Number: [id number] Date of Birth/Sex: Treating RN: 20-Nov-1931 (88 y.o. F) Primary Care Provider: SYSTEM, PRO V IDER Other Clinician: Referring Provider: Treating  Provider/Extender: Jadene Pierini in Treatment: [address], [place] ([id number]) [id number] Page 10 of 10 Diagnosis Coding ICD-10 Codes Code Description E11.621 Type 2 diabetes mellitus with foot ulcer L97.522 Non-pressure chronic ulcer of other part of left foot with fat layer exposed X44.81 Chronic diastolic (congestive) heart failure I27.20 Pulmonary hypertension, unspecified Facility Procedures : CPT4 Code: 85631497 Description: W2637- Grafix PL 16 mm disc Modifier: Quantity: 2 : CPT4 Code: 85885027 Description: 74128 - SKIN SUB GRAFT FACE/NK/HF/G ICD-10 Diagnosis Description L97.522 Non-pressure chronic ulcer of other part of left foot with fat layer exposed Modifier: Quantity: 1 Physician Procedures : NOM7  Code Description Modifier [id number] - WC PHYS LEVEL 4 - EST PT 25 ICD-10 Diagnosis Description L97.522 Non-pressure chronic ulcer of other part of left foot with fat layer exposed E11.621 Type 2 diabetes mellitus with foot ulcer P73.57 Chronic  diastolic (congestive) heart failure I27.20 Pulmonary hypertension, unspecified Quantity: 1 : [id number] - WC PHYS SKIN SUB GRAFT FACE/NK/HF/G ICD-10 Diagnosis Description L97.522 Non-pressure chronic ulcer of other part of left foot with fat layer exposed Quantity: 1 Electronic Signature(s) Signed: 09/20/2022 9:36:56 AM By: Deon Pilling RN, BSN Signed: 09/20/2022 11:15:04 AM By: Fredirick Maudlin MD FACS Previous Signature: 09/15/2022 2:52:15 PM Version By: Fredirick Maudlin MD FACS Entered By: Deon Pilling on 09/20/2022 09:36:55

## 2022-09-27 ENCOUNTER — Encounter: Payer: Self-pay | Admitting: Vascular Surgery

## 2022-09-27 ENCOUNTER — Ambulatory Visit (INDEPENDENT_AMBULATORY_CARE_PROVIDER_SITE_OTHER): Payer: Medicare Other | Admitting: Vascular Surgery

## 2022-09-27 VITALS — BP 130/90 | HR 81 | Temp 97.3°F | Resp 18 | Ht <= 58 in | Wt 218.0 lb

## 2022-09-27 DIAGNOSIS — I70245 Atherosclerosis of native arteries of left leg with ulceration of other part of foot: Secondary | ICD-10-CM | POA: Diagnosis not present

## 2022-09-27 NOTE — Progress Notes (Signed)
[facility]   ASSESSMENT / PLAN: [name] is a 88 y.o. female with atherosclerosis of native arteries of left lower extremity causing ulceration. This has not healed since my last evaluation in May 2023.   Recommend the following which can slow the progression of atherosclerosis and reduce the risk of major adverse cardiac / limb events:  Complete cessation from all tobacco products. Blood glucose control with goal A1c < 7%. Blood pressure control with goal blood pressure < 140/90 mmHg. Lipid reduction therapy with goal LDL-C <100 mg/dL (<70 if symptomatic from PAD).  Aspirin 81mg  PO QD.  Atorvastatin 40-80mg  PO QD (or other "high intensity" statin therapy).  I reviewed the risks / benefits / alternatives to angiography with the patient.  I think an angiogram is reasonable for this patient.  She understands she is not a candidate for open vascular surgery.  She understands we may have no options for making her circulation better.  She understands she would likely need a toe amputation if we are able to successfully revascularize her endovascularly.  We will plan to proceed with angiography in the near future.  CHIEF COMPLAINT: Ulcer about left great toe.  HISTORY OF PRESENT ILLNESS: [name] is a 88 y.o. female referred to clinic from the wound care center for evaluation of a left great toe ulcer.  She has been under the care of Dr. Celine Ahr.  The patient is an elderly, nonambulatory, nursing home resident who is morbidly obese but is mentally quite sharp.  She reports the ulcer has been getting slowly better with local wound care.  04/19/22: returns to clinic. Her wound is improving. She is grateful for the wound care center. Her son is with her today.  09/27/22: Re-referred to clinic.  Her son is with her today.  Her left great toe ulcer has not healed.  We spent the bulk of the visit reviewing risks, benefits, and alternatives to angiography.  I  counseled her about the small, but real risk of bleeding or clotting problems after angiography which could result in emergent surgery.  She explained to me that her foot is very bothersome to her and quite painful.  Past Medical History:  Diagnosis Date   Arthritis    "qwhere"   AV block     2:1  January 14, 2011 / pacemaker plan when cellulitis resolved in length   Bradycardia    2:1  AV block   Cellulitis    Superficial cellulitis right lower leg   January 14, 2011   Chronic diastolic CHF (congestive heart failure) (HCC)    Diabetes mellitus without complication (HCC)    Edema    . EF 65-70%, echo, November 29, 2010   Ejection fraction    EF 65%, echo, January, 2012   Hypertension    Hypokalemia    October, 2012   Murmur    No significant valvular disease by echo, January, 2012   Pacemaker    January, 2013 Medtronic Sensa   Pulmonary hypertension (HCC)     46 mmHg... echo... January, 2012    Past Surgical History:  Procedure Laterality Date   CHOLECYSTECTOMY     HIP ARTHROPLASTY Right    INSERT / REPLACE / REMOVE PACEMAKER  11/2011   PARTIAL HYSTERECTOMY     vaginal   PERMANENT PACEMAKER INSERTION N/A 12/09/2011   Procedure: PERMANENT PACEMAKER INSERTION;  Surgeon: Evans Lance, MD;  Location: [facility] CATH LAB;  Service: Cardiovascular;  Laterality: N/A;  PPM GENERATOR CHANGEOUT N/A 01/03/2022   Procedure: PPM GENERATOR CHANGEOUT;  Surgeon: Evans Lance, MD;  Location: [facility] CV LAB;  Service: Cardiovascular;  Laterality: N/A;    Family History  Problem Relation Age of Onset   Cardiomyopathy Father    Diabetes Father    Heart failure Father    Dementia Mother    Cancer Son    Coronary artery disease Son    Heart attack Neg Hx     Social History   Socioeconomic History   Marital status: Widowed    Spouse name: Not on file   Number of children: Not on file   Years of education: Not on file   Highest education level: Not on file  Occupational History    Occupation: Retired    Fish farm manager: RETIRED  Tobacco Use   Smoking status: Never   Smokeless tobacco: Never  Vaping Use   Vaping Use: Never used  Substance and Sexual Activity   Alcohol use: No   Drug use: No   Sexual activity: Never  Other Topics Concern   Not on file  Social History Narrative   Retired    Widowed    Tobacco Use - No.    Alcohol Use - no   Regular Exercise - no   Drug Use - no   Social Determinants of Radio broadcast assistant Strain: Not on file  Food Insecurity: Not on file  Transportation Needs: Not on file  Physical Activity: Not on file  Stress: Not on file  Social Connections: Not on file  Intimate Partner Violence: Not on file    Allergies  Allergen Reactions   Aspirin Rash and Other (See Comments)    GI distress   Tramadol Other (See Comments)    GI distress   Naproxen    Shellfish-Derived Products Itching and Rash    Current Outpatient Medications  Medication Sig Dispense Refill   Biotin 5000 MCG CAPS Take 5,000 mcg by mouth daily. (0900)     carvedilol (COREG) 6.25 MG tablet Take 6.25 mg by mouth 2 (two) times daily with a meal. (0900 & 1700)     Cholecalciferol (VITAMIN D) 50 MCG (2000 UT) CAPS Take 2,000 Units by mouth in the morning. (0900)     furosemide (LASIX) 80 MG tablet Take 80 mg by mouth in the morning. (0900)     gabapentin (NEURONTIN) 300 MG capsule Take 300 mg by mouth 2 (two) times daily. (0900 & 2100)     HYDROcodone-acetaminophen (NORCO/VICODIN) 5-325 MG tablet Take 1 tablet by mouth every 6 (six) hours as needed (pain).     loratadine (CLARITIN) 10 MG tablet Take 10 mg by mouth in the morning. (0900)     magnesium oxide (MAG-OX) 400 (241.3 MG) MG tablet TAKE ONE TABLET BY MOUTH ONE TIME DAILY 30 tablet 4   melatonin 3 MG TABS tablet Take 3 mg by mouth at bedtime. (2100)     Menthol, Topical Analgesic, (BIOFREEZE COLORLESS EX) Apply 1 application topically 2 (two) times daily.     metolazone (ZAROXOLYN) 2.5 MG tablet  Take one tablet by mouth 4 days a week.  Take 30 minutes prior to furosemide (Patient taking differently: Take one tablet by mouth 4 days a week Monday, Wednesday, Friday, Sunday.  Take 30 minutes prior to furosemide) 64 tablet 3   Multiple Vitamins-Minerals (CERTA-VITE PO) Take 1 tablet by mouth daily. (0900)     nitroGLYCERIN (NITROSTAT) 0.4 MG SL tablet Place 0.4 mg  under the tongue every 5 (five) minutes as needed for chest pain (X 3 DOSES FOR CHEST PAIN).     polyethylene glycol powder (GLYCOLAX/MIRALAX) powder Take 17 g by mouth daily. (0900)     potassium chloride (KLOR-CON) 10 MEQ tablet Take 40 mEq by mouth 2 (two) times daily. (0900 & 2100)     sitaGLIPtin (JANUVIA) 50 MG tablet Take 50 mg by mouth in the morning. (0900)     Vitamins A & D (VITAMIN A & D) ointment Apply 1 application topically every 6 (six) hours as needed for dry skin (protection).     No current facility-administered medications for this visit.    PHYSICAL EXAM There were no vitals filed for this visit.    Constitutional: Elderly woman in wheelchair.  No acute distress Cardiac: regular rate and rhythm.  Respiratory:  unlabored. Abdominal:  soft, non-tender, non-distended.  Peripheral vascular: Palpable pedal pulses.  Small ulcer about the right great toe nailbed, which appears improved from prior evaluation.  PERTINENT LABORATORY AND RADIOLOGIC DATA  Most recent CBC    Latest Ref Rng & Units 12/10/2021   12:41 PM 12/01/2020   12:41 PM 11/30/2020    3:37 PM  CBC  WBC 3.4 - 10.8 x10E3/uL 6.7  8.6  8.4   Hemoglobin 11.1 - 15.9 g/dL 12.2  14.1  13.1   Hematocrit 34.0 - 46.6 % 36.9  43.4  40.5   Platelets 150 - 450 x10E3/uL 290  291  296      Most recent CMP    Latest Ref Rng & Units 12/10/2021   12:41 PM 12/14/2020    2:08 PM 12/01/2020   12:41 PM  CMP  Glucose 70 - 99 mg/dL 119  182  87   BUN 8 - 27 mg/dL 34  41  31   Creatinine 0.57 - 1.00 mg/dL 1.18  0.99  1.45   Sodium 134 - 144 mmol/L 140  139   139   Potassium 3.5 - 5.2 mmol/L 4.7  4.3  3.7   Chloride 96 - 106 mmol/L 98  95  98   CO2 20 - 29 mmol/L 28  24  26    Calcium 8.7 - 10.3 mg/dL 10.1  10.9  10.6   Total Protein 6.0 - 8.5 g/dL 7.5   8.7   Total Bilirubin 0.0 - 1.2 mg/dL <0.2   0.9   Alkaline Phos 44 - 121 IU/L 106   81   AST 0 - 40 IU/L 31   26   ALT 0 - 32 IU/L 16   10     Renal function CrCl cannot be calculated (Patient's most recent lab result is older than the maximum 21 days allowed.).  Hgb A1c MFr Bld (%)  Date Value  12/31/2015 6.4 (H)    Vascular Imaging:  +-------+-----------+-----------+------------+------------+  ABI/TBIToday's ABIToday's TBIPrevious ABIPrevious TBI  +-------+-----------+-----------+------------+------------+  Right  0.74       0.38                                 +-------+-----------+-----------+------------+------------+  Left   0.49       bandage                              +-------+-----------+-----------+------------+------------+  [name]. Stanford Breed, MD [facility] Phone Number: [phone] 09/27/2022 8:58 AM  Total time spent on preparing this encounter including chart review, data review, collecting history, examining the patient, coordinating care for this established patient, 40 minutes.  Portions of this report may have been transcribed using voice recognition software.  Every effort has been made to ensure accuracy; however, inadvertent computerized transcription errors may still be present.

## 2022-09-28 ENCOUNTER — Telehealth: Payer: Self-pay | Admitting: Interventional Cardiology

## 2022-09-28 NOTE — Telephone Encounter (Signed)
Pt daughter would like to speak to RN about Dr. Eldridge Dace availabilty.

## 2022-09-28 NOTE — Telephone Encounter (Signed)
Spoke with the patient's daughter who states that the patient has been dealing with an infected toe. She has seen wound care and was referred to vein and vascular surgery. They have recommended surgery and the patient is hesitant about it and would like to know what cardiology recommends. Advised that this was ultimately the patient's decision. Patient was wanting an appointment with cardiologist. Advised that if she needs cardiac clearance prior to surgery we would certainly bring her in if needed. Daughter verbalized understanding.

## 2022-09-29 ENCOUNTER — Ambulatory Visit (INDEPENDENT_AMBULATORY_CARE_PROVIDER_SITE_OTHER): Payer: Medicare Other | Admitting: Vascular Surgery

## 2022-09-29 ENCOUNTER — Encounter (HOSPITAL_BASED_OUTPATIENT_CLINIC_OR_DEPARTMENT_OTHER): Payer: Medicare Other | Attending: General Surgery | Admitting: General Surgery

## 2022-09-29 DIAGNOSIS — L97522 Non-pressure chronic ulcer of other part of left foot with fat layer exposed: Secondary | ICD-10-CM | POA: Insufficient documentation

## 2022-09-29 DIAGNOSIS — E11621 Type 2 diabetes mellitus with foot ulcer: Secondary | ICD-10-CM | POA: Diagnosis not present

## 2022-09-29 DIAGNOSIS — I110 Hypertensive heart disease with heart failure: Secondary | ICD-10-CM | POA: Diagnosis not present

## 2022-09-29 DIAGNOSIS — I739 Peripheral vascular disease, unspecified: Secondary | ICD-10-CM

## 2022-09-29 DIAGNOSIS — I272 Pulmonary hypertension, unspecified: Secondary | ICD-10-CM | POA: Diagnosis not present

## 2022-09-29 DIAGNOSIS — I442 Atrioventricular block, complete: Secondary | ICD-10-CM | POA: Insufficient documentation

## 2022-09-29 DIAGNOSIS — I5032 Chronic diastolic (congestive) heart failure: Secondary | ICD-10-CM | POA: Diagnosis not present

## 2022-09-29 NOTE — Progress Notes (Signed)
[name], [name] ([id number]) [id number] Page 1 of 9 Visit Report for 09/29/2022 Arrival Information Details Patient Name: Date of Service: [name], [name] 09/29/2022 1:15 PM Medical Record Number: [id number] Patient Account Number: [id number] Date of Birth/Sex: Treating RN: 1931/04/05 (88 y.o. America Franek Primary Care Garnell Begeman: SYSTEM, PRO V IDER Other Clinician: Referring Luis Nickles: Treating Walsie Smeltz/Extender: Jadene Pierini in Treatment: 34 Visit Information History Since Last Visit Added or deleted any medications: No Patient Arrived: Wheel Chair Any new allergies or adverse reactions: No Arrival Time: 13:14 Had a fall or experienced change in No Accompanied By: son activities of daily living that may affect Transfer Assistance: None risk of falls: Patient Identification Verified: Yes Signs or symptoms of abuse/neglect since last visito No Patient Requires Transmission-Based Precautions: No Hospitalized since last visit: No Patient Has Alerts: Yes Implantable device outside of the clinic excluding No Patient Alerts: ABI not obtainable cellular tissue based products placed in the center As per VVS L ABI 0.49 since last visit: As per VVS R ABI 0.74 Has Dressing in Place as Prescribed: Yes Pain Present Now: Yes Electronic Signature([name]) Signed: 09/29/2022 2:21:21 PM By: Dellie Catholic RN Entered By: Dellie Catholic on 09/29/2022 13:38:21 -------------------------------------------------------------------------------- Clinic Level of Care Assessment Details Patient Name: Date of Service: [name], [name] 09/29/2022 1:15 PM Medical Record Number: [id number] Patient Account Number: [id number] Date of Birth/Sex: Treating RN: February 15, 1931 (88 y.o. America Antonetti Primary Care Billey Wojciak: SYSTEM, PRO V IDER Other Clinician: Referring Sharetha Newson: Treating Catrina Fellenz/Extender: Jadene Pierini in Treatment: 34 Clinic Level of Care  Assessment Items TOOL 4 Quantity Score X- 1 0 Use when only an EandM is performed on FOLLOW-UP visit ASSESSMENTS - Nursing Assessment / Reassessment X- 1 10 Reassessment of Co-morbidities (includes updates in patient status) X- 1 5 Reassessment of Adherence to Treatment Plan ASSESSMENTS - Wound and Skin A ssessment / Reassessment X - Simple Wound Assessment / Reassessment - one wound 1 5 _0  - 0 Complex Wound Assessment / Reassessment - multiple wounds _1  - 0 Dermatologic / Skin Assessment (not related to wound area) ASSESSMENTS - Focused Assessment _2  - 0 Circumferential Edema Measurements - multi extremities _3  - 0 Nutritional Assessment / Counseling / Intervention ZOEANN, MOL ([id number]) [id number] Page 2 of 9 _4  - 0 Lower Extremity Assessment (monofilament, tuning fork, pulses) _5  - 0 Peripheral Arterial Disease Assessment (using hand held doppler) ASSESSMENTS - Ostomy and/or Continence Assessment and Care _6  - 0 Incontinence Assessment and Management _7  - 0 Ostomy Care Assessment and Management (repouching, etc.) PROCESS - Coordination of Care X - Simple Patient / Family Education for ongoing care 1 15 _8  - 0 Complex (extensive) Patient / Family Education for ongoing care X- 1 10 Staff obtains Programmer, systems, Records, T Results / Process Orders est X- 1 10 Staff telephones HHA, Nursing Homes / Clarify orders / etc _9  - 0 Routine Transfer to another Facility (non-emergent condition) _10  - 0 Routine Hospital Admission (non-emergent condition) _11  - 0 New Admissions / Biomedical engineer / Ordering NPWT Apligraf, etc. , _12  - 0 Emergency Hospital Admission (emergent condition) X- 1 10 Simple Discharge Coordination _13  - 0 Complex (extensive) Discharge Coordination PROCESS - Special Needs _14  - 0 Pediatric / Minor Patient Management _15  - 0 Isolation Patient Management _16  - 0 Hearing / Language / Visual special needs X- 1  15 Assessment of Community assistance (transportation, D/C planning, etc.) _17  - 0 Additional assistance / Altered mentation _18  - 0 Support Surface([name]) Assessment (bed, cushion, seat,  etc.) INTERVENTIONS - Wound Cleansing / Measurement X - Simple Wound Cleansing - one wound 1 5 _0  - 0 Complex Wound Cleansing - multiple wounds X- 1 5 Wound Imaging (photographs - any number of wounds) _1  - 0 Wound Tracing (instead of photographs) X- 1 5 Simple Wound Measurement - one wound _2  - 0 Complex Wound Measurement - multiple wounds INTERVENTIONS - Wound Dressings X - Small Wound Dressing one or multiple wounds 1 10 _3  - 0 Medium Wound Dressing one or multiple wounds _4  - 0 Large Wound Dressing one or multiple wounds <ENIDPOEUMPNTIRWE>_3<\/XVQMGQQPYPPJKDTO>_6  - 0 Application of Medications - topical <ZTIWPYKDXIPJASNK>_5<\/LZJQBHALPFXTKWIO>_9  - 0 Application of Medications - injection INTERVENTIONS - Miscellaneous _7  - 0 External ear exam _8  - 0 Specimen Collection (cultures, biopsies, blood, body fluids, etc.) _9  - 0 Specimen([name]) / Culture([name]) sent or taken to Lab for analysis _10  - 0 Patient Transfer (multiple staff / Civil Service fast streamer / Similar devices) _11  - 0 Simple Staple / Suture removal (25 or less) _12  - 0 Complex Staple / Suture removal (26 or more) _13  - 0 Hypo / Hyperglycemic Management (close monitor of Blood Glucose) [name], [name] ([id number]) [id number] Page 3 of 9 _14  - 0 Ankle / Brachial Index (ABI) - do not check if billed separately X- 1 5 Vital Signs Has the patient been seen at the hospital within the last three years: Yes Total Score: 110 Level Of Care: New/Established - Level 3 Electronic Signature([name]) Signed: 09/29/2022 2:21:21 PM By: Dellie Catholic RN Entered By: Dellie Catholic on 09/29/2022 13:44:10 -------------------------------------------------------------------------------- Encounter Discharge Information Details Patient Name: Date of Service: [name], [name] Camps. 09/29/2022 1:15 PM Medical Record Number:  [id number] Patient Account Number: [id number] Date of Birth/Sex: Treating RN: 06-Dec-1930 (88 y.o. America Achee Primary Care Daila Elbert: SYSTEM, PRO Ave Filter Other Clinician: Referring Jaryn Rosko: Treating Breelle Hollywood/Extender: Jadene Pierini in Treatment: 34 Encounter Discharge Information Items Discharge Condition: Stable Ambulatory Status: Wheelchair Discharge Destination: Home Transportation: Private Auto Accompanied By: son Schedule Follow-up Appointment: No Clinical Summary of Care: Notes Pt. resides at [facility]. Electronic Signature([name]) Signed: 09/29/2022 2:21:21 PM By: Dellie Catholic RN Entered By: Dellie Catholic on 09/29/2022 13:40:16 -------------------------------------------------------------------------------- Lower Extremity Assessment Details Patient Name: Date of Service: [name], [name] 09/29/2022 1:15 PM Medical Record Number: [id number] Patient Account Number: [id number] Date of Birth/Sex: Treating RN: [birth year] (88 y.o. America Raus Primary Care Yudith Norlander: SYSTEM, PRO Ave Filter Other Clinician: Referring Kenneth Cuaresma: Treating Otis Burress/Extender: Jadene Pierini in Treatment: 34 Edema Assessment Assessed: [Left: No] [Right: No] [Left: Edema] [Right: :] Calf Left: Right: Point of Measurement: 26 cm From Medial Instep Ankle Left: Right: Point of Measurement: 10 cm From [name], [name] ([id number]) [id number] Page 4 of 9 Vascular Assessment Pulses: Dorsalis Pedis Palpable: [Left:Yes] Electronic Signature([name]) Signed: 09/29/2022 2:21:21 PM By: Dellie Catholic RN Entered By: Dellie Catholic on 09/29/2022 13:31:36 -------------------------------------------------------------------------------- Multi Wound Chart Details Patient Name: Date of Service: [name], [name] Camps. 09/29/2022 1:15 PM Medical Record Number: [id number] Patient Account Number: [id number] Date of Birth/Sex: Treating  RN: [birth year] (88 y.o. F) Primary Care Dennis Hegeman: SYSTEM, PRO V IDER Other Clinician: Referring Ikechukwu Cerny: Treating Pheonix Wisby/Extender: Jadene Pierini in Treatment: 34 Vital Signs Height(in): Pulse(bpm): 62 Weight(lbs): Blood Pressure(mmHg): 150/74 Body Mass Index(BMI): Temperature(F): 98.4 Respiratory Rate(breaths/min): 18 [1:Photos: No Photos Left T Great oe Wound Location: Gradually Appeared Wounding Event: Diabetic Wound/Ulcer of the Lower Primary Etiology: Extremity Arrhythmia, Congestive Heart Failure, N/A Comorbid History: Hypertension, Type II Diabetes, Gout, Osteoarthritis  09/29/2021  Date Acquired: 107 Weeks of Treatment: Open Wound Status: No Wound Recurrence: 0.4x0.2x0.1 Measurements L x W x D (cm) 0.063 A (cm) : rea 0.006 Volume (cm) : 77.70% % Reduction in A rea: 89.50% % Reduction in Volume: Grade 2 Classification:  Small Exudate A mount: Serous Exudate Type: amber Exudate Color: Flat and Intact Wound Margin: Small (1-33%) Granulation A mount: Pink Granulation Quality: Large (67-100%) Necrotic A mount: Eschar, Adherent Slough Necrotic Tissue: Fat Layer (Subcutaneous  Tissue): Yes N/A Exposed Structures: Fascia: No Tendon: No Muscle: No Joint: No Bone: No Small (1-33%) Epithelialization: Induration: No Periwound Skin Texture: No Abnormalities Noted Periwound Skin Moisture: No Abnormalities Noted Periwound Skin Color:  No Abnormality Temperature:] [N/A:N/A N/A N/A N/A N/A N/A N/A N/A N/A N/A N/A N/A N/A N/A N/A N/A N/A N/A N/A N/A N/A N/A N/A N/A N/A N/A N/A] Treatment Notes Wound #1 (Toe Great) Wound Laterality: [place] ([id number]) [id number] Page 5 of 9 Cleanser Wound Cleanser Discharge Instruction: Cleanse the wound with wound cleanser prior to applying a clean dressing using gauze sponges, not tissue or cotton balls. Peri-Wound Care Topical Primary Dressing KerraCel Ag Gelling Fiber Dressing, 2x2 in (silver  alginate) Discharge Instruction: on hold when Grafix applied Optifoam Non-Adhesive Dressing, 4x4 in Discharge Instruction: Apply to Left Great toe to protect toe [place] Discharge Instruction: On 09/15/22 Do not remove,Next [facility] 09/29/22 Secondary Dressing Optifoam Non-Adhesive Dressing, 4x4 in Discharge Instruction: Apply over primary dressing can use an Alleyn for padding Woven Gauze Sponges 2x2 in Discharge Instruction: Apply over primary dressing as directed. Secured With Conforming Stretch Gauze Bandage, Sterile 2x75 (in/in) Discharge Instruction: Secure with stretch gauze as directed. Paper Tape, 1x10 (in/yd) Discharge Instruction: Secure dressing with tape as directed. Compression Wrap Compression Stockings Add-Ons Electronic Signature([name]) Signed: 09/29/2022 1:50:14 PM By: Fredirick Maudlin MD FACS Entered By: Fredirick Maudlin on 09/29/2022 13:50:14 -------------------------------------------------------------------------------- Multi-Disciplinary Care Plan Details Patient Name: Date of Service: [name], [name] Camps. 09/29/2022 1:15 PM Medical Record Number: [id number] Patient Account Number: [id number] Date of Birth/Sex: Treating RN: 08-30-1931 (88 y.o. America Bettes Primary Care Vikram Tillett: SYSTEM, PRO Ave Filter Other Clinician: Referring Arvel Oquinn: Treating Eilish Mcdaniel/Extender: Jadene Pierini in Treatment: [place] reviewed with physician Active Inactive Nutrition Nursing Diagnoses: Impaired glucose control: actual or potential Potential for alteratiion in Nutrition/Potential for imbalanced nutrition Goals: Patient/caregiver will maintain therapeutic glucose control Date Initiated: 02/01/2022 Target Resolution Date: 10/20/2022 Goal Status: Active Interventions: Assess HgA1c results as ordered upon admission and as needed CARRELL, PALMATIER ([id number]) [id number] Page 6 of 9 Assess patient nutrition upon admission and as  needed per policy Provide education on elevated blood sugars and impact on wound healing Provide education on nutrition Treatment Activities: Education provided on Nutrition : 02/01/2022 Notes: Wound/Skin Impairment Nursing Diagnoses: Impaired tissue integrity Knowledge deficit related to ulceration/compromised skin integrity Goals: Patient/caregiver will verbalize understanding of skin care regimen Date Initiated: 02/01/2022 Date Inactivated: 05/18/2022 Target Resolution Date: 04/28/2022 Goal Status: Met Ulcer/skin breakdown will have a volume reduction of 30% by week 4 Date Initiated: 02/01/2022 Date Inactivated: 03/24/2022 Target Resolution Date: 03/31/2022 Goal Status: Unmet Unmet Reason: PAD Ulcer/skin breakdown will have a volume reduction of 50% by week 8 Date Initiated: 04/06/2022 Target Resolution Date: 10/20/2022 Goal Status: Active Interventions: Assess patient/caregiver ability to obtain necessary supplies Assess patient/caregiver ability to perform ulcer/skin care regimen upon admission and as needed Assess ulceration([name]) every visit Provide education on ulcer and skin care Notes: Electronic Signature([name]) Signed: 09/29/2022 2:21:21 PM By: Minus Liberty,  [name] RN Entered By: Dellie Catholic on 09/29/2022 13:38:50 -------------------------------------------------------------------------------- Pain Assessment Details Patient Name: Date of Service: [name], [name] 09/29/2022 1:15 PM Medical Record Number: [id number] Patient Account Number: [id number] Date of Birth/Sex: Treating RN: 05-10-1931 (88 y.o. America Patchell Primary Care Hondo Nanda: SYSTEM, PRO Ave Filter Other Clinician: Referring Corrie Reder: Treating Holli Rengel/Extender: Jadene Pierini in Treatment: 34 Active Problems Location of Pain Severity and Description of Pain Patient Has Paino Yes Site Locations Pain LocationDARICE, VICARIO ([id number]) [phone].pdf Page 7 of 9 Pain  Location: Generalized Pain With Dressing Change: Yes Duration of the Pain. Constant / Intermittento Constant Rate the pain. Current Pain Level: 4 Worst Pain Level: 10 Least Pain Level: 3 Tolerable Pain Level: 4 Character of Pain Describe the Pain: Difficult to Pinpoint Pain Management and Medication Current Pain Management: Medication: Yes Cold Application: No Rest: Yes Massage: No Activity: No T.E.N.[name].: No Heat Application: No Leg drop or elevation: No Is the Current Pain Management Adequate: Inadequate How does your wound impact your activities of daily livingo Sleep: Yes Bathing: No Appetite: Yes Relationship With Others: No Bladder Continence: No Emotions: No Bowel Continence: No Work: No Toileting: No Drive: No Dressing: No Hobbies: No Electronic Signature([name]) Signed: 09/29/2022 2:21:21 PM By: Dellie Catholic RN Entered By: Dellie Catholic on 09/29/2022 13:31:23 -------------------------------------------------------------------------------- Patient/Caregiver Education Details Patient Name: Date of Service: [name] 11/9/2023andnbsp1:15 PM Medical Record Number: [id number] Patient Account Number: [id number] Date of Birth/Gender: Treating RN: 14-Sep-1931 (88 y.o. America Reierson Primary Care Physician: SYSTEM, PRO V IDER Other Clinician: Referring Physician: Treating Physician/Extender: Jadene Pierini in Treatment: 4 Education Assessment Education Provided To: Patient Education Topics Provided Wound/Skin Impairment: Methods: Explain/Verbal Responses: Return demonstration correctly Electronic Signature([name]) Signed: 09/29/2022 2:21:21 PM By: Dellie Catholic RN [name], [name] ([id number]) [id number] Page 8 of 9 Entered By: Dellie Catholic on 09/29/2022 13:39:06 -------------------------------------------------------------------------------- Wound Assessment Details Patient Name: Date of Service: [name], [name] 09/29/2022 1:15 PM Medical Record Number: [id number] Patient Account Number: [id number] Date of Birth/Sex: Treating RN: [birth year] (88 y.o. America Clausing Primary Care Kenita Bines: SYSTEM, PRO V IDER Other Clinician: Referring Taylah Dubiel: Treating Zeb Rawl/Extender: Jadene Pierini in Treatment: 34 Wound Status Wound Number: 1 Primary Diabetic Wound/Ulcer of the Lower Extremity Etiology: Wound Location: Left T Great oe Wound Open Wounding Event: Gradually Appeared Status: Date Acquired: 09/29/2021 Comorbid Arrhythmia, Congestive Heart Failure, Hypertension, Type II Weeks Of Treatment: 34 History: Diabetes, Gout, Osteoarthritis Clustered Wound: No Photos Wound Measurements Length: (cm) 0.4 Width: (cm) 0.2 Depth: (cm) 0.1 Area: (cm) 0.063 Volume: (cm) 0.006 % Reduction in Area: 77.7% % Reduction in Volume: 89.5% Epithelialization: Small (1-33%) Tunneling: No Undermining: No Wound Description Classification: Grade 2 Wound Margin: Flat and Intact Exudate Amount: Small Exudate Type: Serous Exudate Color: amber Foul Odor After Cleansing: No Slough/Fibrino Yes Wound Bed Granulation Amount: Small (1-33%) Exposed Structure Granulation Quality: Pink Fascia Exposed: No Necrotic Amount: Large (67-100%) Fat Layer (Subcutaneous Tissue) Exposed: Yes Necrotic Quality: Eschar, Adherent Slough Tendon Exposed: No Muscle Exposed: No Joint Exposed: No Bone Exposed: No Periwound Skin Texture Texture Color No Abnormalities Noted: Yes No Abnormalities Noted: Yes Moisture Temperature / Pain No Abnormalities Noted: Yes Temperature: No Abnormality Treatment Notes Wound #1 (Toe Great) Wound Laterality: Left [name], [name] ([id number]) [id number] Page 9 of 9 Cleanser Wound Cleanser Discharge Instruction: Cleanse the wound with wound cleanser prior to applying a clean dressing using gauze sponges, not tissue or cotton balls. Peri-Wound  Care Topical Primary Dressing KerraCel  Ag Gelling Fiber Dressing, 2x2 in (silver alginate) Discharge Instruction: on hold when Grafix applied Optifoam Non-Adhesive Dressing, 4x4 in Discharge Instruction: Apply to Left Great toe to protect toe GRAFIX Discharge Instruction: On 09/15/22 Do not remove,Next [facility] 09/29/22 Secondary Dressing Optifoam Non-Adhesive Dressing, 4x4 in Discharge Instruction: Apply over primary dressing can use an Alleyn for padding Woven Gauze Sponges 2x2 in Discharge Instruction: Apply over primary dressing as directed. Secured With Conforming Stretch Gauze Bandage, Sterile 2x75 (in/in) Discharge Instruction: Secure with stretch gauze as directed. Paper Tape, 1x10 (in/yd) Discharge Instruction: Secure dressing with tape as directed. Compression Wrap Compression Stockings Add-Ons Electronic Signature([name]) Signed: 09/29/2022 2:21:21 PM By: Dellie Catholic RN Entered By: Dellie Catholic on 09/29/2022 13:50:42 -------------------------------------------------------------------------------- Vitals Details Patient Name: Date of Service: [name], [name] Camps. 09/29/2022 1:15 PM Medical Record Number: [id number] Patient Account Number: [id number] Date of Birth/Sex: Treating RN: October 16, 1931 (88 y.o. America Loper Primary Care Augusta Hilbert: SYSTEM, PRO V IDER Other Clinician: Referring Carnell Beavers: Treating Jozef Eisenbeis/Extender: Jadene Pierini in Treatment: 34 Vital Signs Time Taken: 13:20 Temperature (F): 98.4 Pulse (bpm): 86 Respiratory Rate (breaths/min): 18 Blood Pressure (mmHg): 150/74 Reference Range: 80 - 120 mg / dl Electronic Signature([name]) Signed: 09/29/2022 2:21:21 PM By: Dellie Catholic RN Entered By: Dellie Catholic on 09/29/2022 13:30:35

## 2022-09-29 NOTE — Progress Notes (Signed)
MALEEHA, HALLS ([id number]) [id number] Page 1 of 8 Visit Report for 09/29/2022 Chief Complaint Document Details Patient Name: Date of Service: [name], [name] 09/29/2022 1:15 PM Medical Record Number: [id number] Patient Account Number: [id number] Date of Birth/Sex: Treating RN: 1931-03-13 (88 y.o. F) Primary Care Provider: SYSTEM, PRO V IDER Other Clinician: Referring Provider: Treating Provider/Extender: [name] in Treatment: 34 Information Obtained from: Patient Chief Complaint Patient presents to the wound care center with open non-healing surgical wound(s) Electronic Signature(s) Signed: 09/29/2022 1:50:40 PM By: Duanne Guess MD FACS Entered By: Duanne Guess on 09/29/2022 13:50:40 -------------------------------------------------------------------------------- HPI Details Patient Name: Date of Service: [name], [name] Portela. 09/29/2022 1:15 PM Medical Record Number: [id number] Patient Account Number: [id number] Date of Birth/Sex: Treating RN: May 30, 1931 (88 y.o. F) Primary Care Provider: SYSTEM, PRO V IDER Other Clinician: Referring Provider: Treating Provider/Extender: [name] in Treatment: 34 History of Present Illness HPI Description: ADMISSION 02/01/2022 This is an 88 year old woman who is a resident of [place]. She has a past medical history notable for type 2 diabetes mellitus, congestive heart failure, pulmonary hypertension, and complete heart block. She is accompanied today by her son. Approximately 4 months ago, she had an ingrown toe nail removed. Apparently this went a bit deep. Since that time, it has not healed. The patient is nonambulatory. Apparently they have been applying Santyl to the site at her facility. She was on antibiotics but I am not certain which specific drugs she took. She completed her last course a few weeks ago. We were unable to obtain ABIs in clinic secondary to the patient's  body habitus. She has not had any formal vascular studies nor any x-ray of the area. 02/09/2022: The wound is roughly the same size today, but the amount of slough in the base has decreased significantly. She apparently had an x-ray done at her facility, but we do not have the report; per verbal report from facility staff, "there was nothing there." We had requested formal ABI/TBI studies, but these were not done. She has been in [place] under [facility]	[place]. 02/16/2022: The wound measured about the same today, but to my eye, it appears rather smaller. The slough in the base is minimal. Her vascular studies are scheduled for Friday. We have been using Santyl under Hydrofera Blue. No concern for infection. 02/23/2022: Once again, the wound has been measured about the same size, but I think it looks a bit smaller. There is slough at the base. She had her ABIs done last week and these show severe bilateral peripheral arterial disease. We received the report of the foot x-ray that was ordered. There is no concern for osteomyelitis at this time. 03/02/2022: The wound is smaller today with a small amount of slough at the base. We placed a referral to vein and vascular surgery last week in response to her severe peripheral arterial disease, but she does not yet have an appointment on the books. 03/16/2022: She saw Dr. Lenell Antu in the vascular surgery department yesterday. He did not think she was a good candidate for any revascularization options and suggested that she continue local wound care for another month. If she fails to heal in that time, they may consider an angiogram with potential intervention. The wound today is roughly the same size but there is less exposed bone at the base and the surface is clean without any substantial slough. 03/24/2022: The wound appears about the same. There is some periwound callus that has heaped up. We changed  her to Prisma silver collagen last week. 04/06/2022: No significant  change to the wound. It is neither improving nor deteriorating. She continues to accumulate some periwound callus around the margin. Bone is exposed at the base. [name], [name] ([id number]) [id number] Page 2 of 8 04/20/2022: The wound has improved quite a bit over the past 2 weeks. It is smaller and there is no real accumulation of periwound callus. She had follow-up with Dr. Lenell Antu yesterday and he felt that the wound was progressing well enough that no arterial intervention was necessary. 05/18/2022: The patient has been absent due to a fall from her wheelchair during her last transportation that was supposed to be to her clinic. Today, the wound is about the same size. There is still bone exposed at the base. There is periwound callus accumulation. No concern for infection. 06/01/2022: The wound is actually a little bit smaller today and the surface is not as pale. She still has accumulated periwound callus. 06/15/2022: The wound continues to contract, albeit slowly. No concern for infection. 06/28/2022: The wound is slightly smaller again today. She continues to accumulate callus and there is some periwound epibole at the margins. No concern for gross infection. 07/11/2022: The wound is roughly the same size with just a small opening in the center. She has been approved for Grafix. 07/18/2022: The wound has contracted somewhat and the surface has a healthier pink look to it. We applied Grafix last week and she is here for application #2. 07/28/2022: The wound is about the same size but the surface continues to improve and appear more vital. She is here for Grafix application #3. 08/04/2022: There is still a stubborn central portion of her toe wound that is not closing in yet. The bone remains exposed. She is here for Grafix application #4. 08/11/2022: Today when I debrided the eschar and callus from her wound, purulent drainage was expressed from her wound. The underlying surface  is unchanged with bone exposed. She has been having more pain in the toe and this may explain why. 08/18/2022: The culture that I took last week grew out Candida and Acinetobacter. We changed her dressing regimen to terbinafine instead of gentamicin and I prescribed levofloxacin instead of Augmentin. T oday, the wound does not look that much different. It remains small with granulation tissue at the base, but bone remains exposed. 09/01/2022: The wound is about the same. I had debrided a fair amount of periwound callus and nonviable skin last week, so it does measure a little bit larger. The bone is still exposed. No obvious signs of infection. I requested a plain x-ray of the patient's foot a month ago and still have not received the results of that study, performed at her nursing facility. She continues to complain of pain in her toe and I am concerned that she has osteomyelitis. Based upon her previous ABIs, she does not have adequate blood flow to heal an amputation and she has failed to make much progress in terms of her wound healing. 09/15/2022: I did receive the results of the x-ray, but no actual images. No osteomyelitis was identified on the film. We referred her back to vascular surgery, but she does not yet have an appointment. The wound is unchanged. 09/29/2022: She was seen by Dr. Luberta Mutter on November 7. He has offered her an angiogram with intervention if feasible. She is not sure if she is comfortable with going forward. Her wound is completely unchanged. Electronic Signature(s) Signed: 09/29/2022 1:51:50 PM By:  Duanne Guess MD FACS Entered By: Duanne Guess on 09/29/2022 13:51:50 -------------------------------------------------------------------------------- Physical Exam Details Patient Name: Date of Service: [name], [name] 09/29/2022 1:15 PM Medical Record Number: [id number] Patient Account Number: [id number] Date of Birth/Sex: Treating RN: 1931-05-07 (88 y.o.  F) Primary Care Provider: SYSTEM, PRO V IDER Other Clinician: Referring Provider: Treating Provider/Extender: [name] in Treatment: 34 Constitutional Hypertensive, asymptomatic. . . . No acute distress.[name] Respiratory Normal work of breathing on room air.. Notes 09/29/2022: The wound is completely unchanged. Electronic Signature(s) Signed: 09/29/2022 1:53:02 PM By: Duanne Guess MD FACS Entered By: Duanne Guess on 09/29/2022 13:53:02 [name] ([id number]) [id number] Page 3 of 8 -------------------------------------------------------------------------------- Physician Orders Details Patient Name: Date of Service: [name], [name] 09/29/2022 1:15 PM Medical Record Number: [id number] Patient Account Number: [id number] Date of Birth/Sex: Treating RN: [birth year] (88 y.o. [name] Primary Care Provider: SYSTEM, PRO V IDER Other Clinician: Referring Provider: Treating Provider/Extender: [name] in Treatment: 44 Verbal / Phone Orders: No Diagnosis Coding ICD-10 Coding Code Description E11.621 Type 2 diabetes mellitus with foot ulcer L97.522 Non-pressure chronic ulcer of other part of left foot with fat layer exposed I50.32 Chronic diastolic (congestive) heart failure I27.20 Pulmonary hypertension, unspecified Follow-up Appointments ppointment in 2 weeks. - Dr. Lady Gary Room 3 Return A Other: - Grafix #5 09/15/22 Anesthetic (In clinic) Topical Lidocaine 4% applied to wound bed - In Clinic Cellular or Tissue Based Products Wound #1 Left T Great oe daptic or Mepitel. (DO NOT REMOVE). - *** Cellular or Tissue Based Product applied to wound bed, secured with steri-strips, cover with A Do not remove dressing for 2 weeks**** Skin Sub Grafix PL PRIME 15mm (Disc)-On Hold 09/29/22 Bathing/ Shower/ Hygiene May shower with protection but do not get wound dressing(s) wet. - Do Not Left Great T wet**** Skin  Substitute (Grafix #5) applied 09/15/22. oe Grafix on Hold 09/29/22 Off-Loading Other: - No direct pressure on toes Wound Treatment Wound #1 - T Great oe Wound Laterality: Left Cleanser: Wound Cleanser Every Other Day/30 Days Discharge Instructions: Cleanse the wound with wound cleanser prior to applying a clean dressing using gauze sponges, not tissue or cotton balls. Prim Dressing: KerraCel Ag Gelling Fiber Dressing, 2x2 in (silver alginate) ary Every Other Day/30 Days Discharge Instructions: apply to wound bed Prim Dressing: Optifoam Non-Adhesive Dressing, 4x4 in Every Other Day/30 Days ary Discharge Instructions: Apply to Left Great toe to protect toe Prim Dressing: GRAFIX Every Other Day/30 Days ary Discharge Instructions: On hold 09/29/22 Secondary Dressing: Optifoam Non-Adhesive Dressing, 4x4 in Every Other Day/30 Days Discharge Instructions: Apply over primary dressing can use an Alleyn for padding Secondary Dressing: Woven Gauze Sponges 2x2 in Every Other Day/30 Days Discharge Instructions: Apply over primary dressing as directed. Secured With: Insurance underwriter, Sterile 2x75 (in/in) Every Other Day/30 Days Discharge Instructions: Secure with stretch gauze as directed. Secured With: Paper Tape, 1x10 (in/yd) Every Other Day/30 Days Discharge Instructions: Secure dressing with tape as directed. Electronic Signature(s) Signed: 09/29/2022 3:09:58 PM By: Duanne Guess MD FACS [place], [name] Portela ([id number]) [id number] Page 4 of 8 Entered By: Duanne Guess on 09/29/2022 13:54:23 -------------------------------------------------------------------------------- Problem List Details Patient Name: Date of Service: [name], [name] 09/29/2022 1:15 PM Medical Record Number: [id number] Patient Account Number: [id number] Date of Birth/Sex: Treating RN: November 02, 1931 (88 y.o. F) Primary Care Provider: SYSTEM, PRO V IDER Other  Clinician: Referring Provider: Treating Provider/Extender: [name] in Treatment: 34 Active Problems ICD-10 Encounter Code Description Active Date  MDM Diagnosis E11.621 Type 2 diabetes mellitus with foot ulcer 02/01/2022 No Yes L97.522 Non-pressure chronic ulcer of other part of left foot with fat layer exposed 02/01/2022 No Yes I50.32 Chronic diastolic (congestive) heart failure 02/01/2022 No Yes I27.20 Pulmonary hypertension, unspecified 02/01/2022 No Yes Inactive Problems Resolved Problems Electronic Signature(s) Signed: 09/29/2022 1:49:37 PM By: Duanne Guess MD FACS Entered By: Duanne Guess on 09/29/2022 13:49:37 -------------------------------------------------------------------------------- Progress Note Details Patient Name: Date of Service: [name], [name] Portela. 09/29/2022 1:15 PM Medical Record Number: [id number] Patient Account Number: [id number] Date of Birth/Sex: Treating RN: [birth year] (88 y.o. F) Primary Care Provider: SYSTEM, PRO V IDER Other Clinician: Referring Provider: Treating Provider/Extender: [name] in Treatment: 34 Subjective Chief Complaint Information obtained from Patient Patient presents to the wound care center with open non-healing surgical wound(s) History of Present Illness (HPI) ADMISSION [name], [name] ([id number]) [id number] Page 5 of 8 02/01/2022 This is an 88 year old woman who is a resident of [place]. She has a past medical history notable for type 2 diabetes mellitus, congestive heart failure, pulmonary hypertension, and complete heart block. She is accompanied today by her son. Approximately 4 months ago, she had an ingrown toe nail removed. Apparently this went a bit deep. Since that time, it has not healed. The patient is nonambulatory. Apparently they have been applying Santyl to the site at her facility. She was on antibiotics but I am not certain which specific drugs she  took. She completed her last course a few weeks ago. We were unable to obtain ABIs in clinic secondary to the patient's body habitus. She has not had any formal vascular studies nor any x-ray of the area. 02/09/2022: The wound is roughly the same size today, but the amount of slough in the base has decreased significantly. She apparently had an x-ray done at her facility, but we do not have the report; per verbal report from facility staff, "there was nothing there." We had requested formal ABI/TBI studies, but these were not done. She has been in [place] under [facility]	[place]. 02/16/2022: The wound measured about the same today, but to my eye, it appears rather smaller. The slough in the base is minimal. Her vascular studies are scheduled for Friday. We have been using Santyl under Hydrofera Blue. No concern for infection. 02/23/2022: Once again, the wound has been measured about the same size, but I think it looks a bit smaller. There is slough at the base. She had her ABIs done last week and these show severe bilateral peripheral arterial disease. We received the report of the foot x-ray that was ordered. There is no concern for osteomyelitis at this time. 03/02/2022: The wound is smaller today with a small amount of slough at the base. We placed a referral to vein and vascular surgery last week in response to her severe peripheral arterial disease, but she does not yet have an appointment on the books. 03/16/2022: She saw Dr. Lenell Antu in the vascular surgery department yesterday. He did not think she was a good candidate for any revascularization options and suggested that she continue local wound care for another month. If she fails to heal in that time, they may consider an angiogram with potential intervention. The wound today is roughly the same size but there is less exposed bone at the base and the surface is clean without any substantial slough. 03/24/2022: The wound appears about the same. There is  some periwound callus that has heaped up. We changed her to Prisma silver  collagen last week. 04/06/2022: No significant change to the wound. It is neither improving nor deteriorating. She continues to accumulate some periwound callus around the margin. Bone is exposed at the base. 04/20/2022: The wound has improved quite a bit over the past 2 weeks. It is smaller and there is no real accumulation of periwound callus. She had follow-up with Dr. Lenell Antu yesterday and he felt that the wound was progressing well enough that no arterial intervention was necessary. 05/18/2022: The patient has been absent due to a fall from her wheelchair during her last transportation that was supposed to be to her clinic. Today, the wound is about the same size. There is still bone exposed at the base. There is periwound callus accumulation. No concern for infection. 06/01/2022: The wound is actually a little bit smaller today and the surface is not as pale. She still has accumulated periwound callus. 06/15/2022: The wound continues to contract, albeit slowly. No concern for infection. 06/28/2022: The wound is slightly smaller again today. She continues to accumulate callus and there is some periwound epibole at the margins. No concern for gross infection. 07/11/2022: The wound is roughly the same size with just a small opening in the center. She has been approved for Grafix. 07/18/2022: The wound has contracted somewhat and the surface has a healthier pink look to it. We applied Grafix last week and she is here for application #2. 07/28/2022: The wound is about the same size but the surface continues to improve and appear more vital. She is here for Grafix application #3. 08/04/2022: There is still a stubborn central portion of her toe wound that is not closing in yet. The bone remains exposed. She is here for Grafix application #4. 08/11/2022: Today when I debrided the eschar and callus from her wound, purulent drainage was expressed  from her wound. The underlying surface is unchanged with bone exposed. She has been having more pain in the toe and this may explain why. 08/18/2022: The culture that I took last week grew out Candida and Acinetobacter. We changed her dressing regimen to terbinafine instead of gentamicin and I prescribed levofloxacin instead of Augmentin. T oday, the wound does not look that much different. It remains small with granulation tissue at the base, but bone remains exposed. 09/01/2022: The wound is about the same. I had debrided a fair amount of periwound callus and nonviable skin last week, so it does measure a little bit larger. The bone is still exposed. No obvious signs of infection. I requested a plain x-ray of the patient's foot a month ago and still have not received the results of that study, performed at her nursing facility. She continues to complain of pain in her toe and I am concerned that she has osteomyelitis. Based upon her previous ABIs, she does not have adequate blood flow to heal an amputation and she has failed to make much progress in terms of her wound healing. 09/15/2022: I did receive the results of the x-ray, but no actual images. No osteomyelitis was identified on the film. We referred her back to vascular surgery, but she does not yet have an appointment. The wound is unchanged. 09/29/2022: She was seen by Dr. Luberta Mutter on November 7. He has offered her an angiogram with intervention if feasible. She is not sure if she is comfortable with going forward. Her wound is completely unchanged. Patient History Social History Never smoker, Marital Status - Widowed, Alcohol Use - Never, Drug Use - No History, Caffeine Use -  Rarely. Medical History Cardiovascular Patient has history of Arrhythmia - AV block, complete heart block, Congestive Heart Failure, Hypertension Endocrine Patient has history of Type II Diabetes Musculoskeletal Patient has history of Gout, Osteoarthritis Medical A  Surgical History Notes nd Cardiovascular Pulmonary Hypertension Endocrine Hypoglycemia Neurologic Acute encephalopathy [name], [name] ([id number]) [id number] Page 6 of 8 Objective Constitutional Hypertensive, asymptomatic. No acute distress.. Vitals Time Taken: 1:20 PM, Temperature: 98.4 F, Pulse: 86 bpm, Respiratory Rate: 18 breaths/min, Blood Pressure: 150/74 mmHg. Respiratory Normal work of breathing on room air.. General Notes: 09/29/2022: The wound is completely unchanged. Integumentary (Hair, Skin) Wound #1 status is Open. Original cause of wound was Gradually Appeared. The date acquired was: 09/29/2021. The wound has been in treatment 34 weeks. The wound is located on the Left T Great. The wound measures 0.4cm length x 0.2cm width x 0.1cm depth; 0.063cm^2 area and 0.006cm^3 volume. There is Fat oe Layer (Subcutaneous Tissue) exposed. There is no tunneling or undermining noted. There is a small amount of serous drainage noted. The wound margin is flat and intact. There is small (1-33%) pink granulation within the wound bed. There is a large (67-100%) amount of necrotic tissue within the wound bed including Eschar and Adherent Slough. The periwound skin appearance had no abnormalities noted for texture. The periwound skin appearance had no abnormalities noted for moisture. The periwound skin appearance had no abnormalities noted for color. Periwound temperature was noted as No Abnormality. Assessment Active Problems ICD-10 Type 2 diabetes mellitus with foot ulcer Non-pressure chronic ulcer of other part of left foot with fat layer exposed Chronic diastolic (congestive) heart failure Pulmonary hypertension, unspecified Plan Follow-up Appointments: Return Appointment in 2 weeks. - Dr. Lady Gary Room 3 Other: - Grafix #5 09/15/22 Anesthetic: (In clinic) Topical Lidocaine 4% applied to wound bed - In Clinic Cellular or Tissue Based Products: Wound #1  Left T Great: oe Cellular or Tissue Based Product applied to wound bed, secured with steri-strips, cover with Adaptic or Mepitel. (DO NOT REMOVE). - *** Do not remove dressing for 2 weeks**** Skin Sub Grafix PL PRIME 68mm (Disc)-On Hold 09/29/22 Bathing/ Shower/ Hygiene: May shower with protection but do not get wound dressing(s) wet. - Do Not Left Great T wet**** Skin Substitute (Grafix #5) applied 09/15/22. Grafix on Hold oe 09/29/22 Off-Loading: Other: - No direct pressure on toes WOUND #1: - T Great Wound Laterality: Left oe Cleanser: Wound Cleanser Every Other Day/30 Days Discharge Instructions: Cleanse the wound with wound cleanser prior to applying a clean dressing using gauze sponges, not tissue or cotton balls. Prim Dressing: KerraCel Ag Gelling Fiber Dressing, 2x2 in (silver alginate) Every Other Day/30 Days ary Discharge Instructions: apply to wound bed Prim Dressing: Optifoam Non-Adhesive Dressing, 4x4 in Every Other Day/30 Days ary Discharge Instructions: Apply to Left Great toe to protect toe Prim Dressing: GRAFIX Every Other Day/30 Days ary Discharge Instructions: On hold 09/29/22 Secondary Dressing: Optifoam Non-Adhesive Dressing, 4x4 in Every Other Day/30 Days Discharge Instructions: Apply over primary dressing can use an Alleyn for padding Secondary Dressing: Woven Gauze Sponges 2x2 in Every Other Day/30 Days Discharge Instructions: Apply over primary dressing as directed. Secured With: Insurance underwriter, Sterile 2x75 (in/in) Every Other Day/30 Days Discharge Instructions: Secure with stretch gauze as directed. Secured With: Paper T ape, 1x10 (in/yd) Every Other Day/30 Days Discharge Instructions: Secure dressing with tape as directed. 09/29/2022: Her wound is completely unchanged. At this point, I feel like I have exhausted all of the feasible options available  to the patient that are available to her in the wound care center. We have deployed aggressive  debridement, we have treated infection, we have utilized skin substitutes, all to no avail. I had an [name], [name] ([id number]) [id number] Page 7 of 8 extensive discussion with the patient, her son, and her caregiver. I explained the process of how an angiogram is performed and the various potential interventions that could be performed by Dr. Ivette Loyal in, depending on what he finds on imaging. I also discussed the possibility that no intervention might be possible. If that is the case, she will likely require an amputation of her great toe. I strongly encouraged the patient to proceed with the angiogram and her son also supports this option, but she has some hesitation related to previous hospital experiences. I am hopeful that she will proceed. For now, we will plan to have her dressing changed every other day with silver alginate.In total, I spent 35 minutes on this visit, greater than 50% of which was involved with counseling and coordination of patient care. Follow-up in 2 weeks. Electronic Signature(s) Signed: 09/29/2022 1:57:00 PM By: Duanne Guess MD FACS Entered By: Duanne Guess on 09/29/2022 13:57:00 -------------------------------------------------------------------------------- HxROS Details Patient Name: Date of Service: [name], [name] Portela. 09/29/2022 1:15 PM Medical Record Number: [id number] Patient Account Number: [id number] Date of Birth/Sex: Treating RN: [birth year] (88 y.o. F) Primary Care Provider: SYSTEM, PRO V IDER Other Clinician: Referring Provider: Treating Provider/Extender: [name] in Treatment: 34 Cardiovascular Medical History: Positive for: Arrhythmia - AV block, complete heart block; Congestive Heart Failure; Hypertension Past Medical History Notes: Pulmonary Hypertension Endocrine Medical History: Positive for: Type II Diabetes Past Medical History Notes: Hypoglycemia Treated with: Oral agents Blood sugar  tested every day: Yes Tested : 2-3x a day Musculoskeletal Medical History: Positive for: Gout; Osteoarthritis Neurologic Medical History: Past Medical History Notes: Acute encephalopathy Immunizations Pneumococcal Vaccine: Received Pneumococcal Vaccination: No Implantable Devices No devices added Family and Social History Never smoker; Marital Status - Widowed; Alcohol Use: Never; Drug Use: No History; Caffeine Use: Rarely; Financial Concerns: No; Food, Clothing or Shelter Needs: No; Support System Lacking: No; Transportation Concerns: No Electronic Signature(s) Signed: 09/29/2022 3:09:58 PM By: Duanne Guess MD FACS Entered By: Duanne Guess on 09/29/2022 13:51:57 [name] ([id number]) [id number] Page 8 of 8 -------------------------------------------------------------------------------- SuperBill Details Patient Name: Date of Service: [name], [name] 09/29/2022 Medical Record Number: [id number] Patient Account Number: [id number] Date of Birth/Sex: Treating RN: 06-13-1931 (88 y.o. [name] Primary Care Provider: SYSTEM, PRO V IDER Other Clinician: Referring Provider: Treating Provider/Extender: [name] in Treatment: 34 Diagnosis Coding ICD-10 Codes Code Description E11.621 Type 2 diabetes mellitus with foot ulcer L97.522 Non-pressure chronic ulcer of other part of left foot with fat layer exposed I50.32 Chronic diastolic (congestive) heart failure I27.20 Pulmonary hypertension, unspecified Facility Procedures : CPT4 Code: 63875643 Description: 99213 - WOUND CARE VISIT-LEV 3 EST PT Modifier: Quantity: 1 Physician Procedures : CPT4 Code Description Modifier [id number] 99214 - WC PHYS LEVEL 4 - EST PT ICD-10 Diagnosis Description L97.522 Non-pressure chronic ulcer of other part of left foot with fat layer exposed E11.621 Type 2 diabetes mellitus with foot ulcer I50.32 Chronic  diastolic (congestive) heart  failure I27.20 Pulmonary hypertension, unspecified Quantity: 1 Electronic Signature(s) Signed: 09/29/2022 1:57:16 PM By: Duanne Guess MD FACS Entered By: Duanne Guess on 09/29/2022 13:57:16

## 2022-09-29 NOTE — Progress Notes (Signed)
Discussed case with patient's daughter via telephone. She expressed desire for cardiology opinion prior to angiogram. We will put in the referral and make tentative plans for angiography in the coming weeks.  Rande Brunt. Lenell Antu, MD [facility] Phone Number: [phone] 09/29/2022 3:59 PM

## 2022-10-04 ENCOUNTER — Telehealth: Payer: Self-pay | Admitting: *Deleted

## 2022-10-04 NOTE — Telephone Encounter (Signed)
   Pre-operative Risk Assessment    Patient Name: [name]  DOB: [birth year] MRN: [id number]      Request for Surgical Clearance    Procedure:   ABDOMINAL AORTOGRAM  with B/L RUNOFF & POSSIBLE INTERVENTION  Date of Surgery:  Clearance TBD                                 Surgeon:  DR. Heath Lark Surgeon's Group or Practice Name:  [facility] Phone number:  [phone] Fax number:  [phone]   Type of Clearance Requested:   - Medical ; NO MEDICATIONS LISTED AS NEEDING TO BE HELD   Type of Anesthesia:  Not Indicated   Additional requests/questions:    Elpidio Anis   10/04/2022, 5:08 PM

## 2022-10-10 ENCOUNTER — Ambulatory Visit (INDEPENDENT_AMBULATORY_CARE_PROVIDER_SITE_OTHER): Payer: Medicare Other

## 2022-10-10 DIAGNOSIS — I442 Atrioventricular block, complete: Secondary | ICD-10-CM | POA: Diagnosis not present

## 2022-10-10 LAB — CUP PACEART REMOTE DEVICE CHECK
Battery Remaining Longevity: 134 mo
Battery Voltage: 3.08 V
Brady Statistic AP VP Percent: 8.56 %
Brady Statistic AP VS Percent: 0 %
Brady Statistic AS VP Percent: 91.43 %
Brady Statistic AS VS Percent: 0.01 %
Brady Statistic RA Percent Paced: 8.53 %
Brady Statistic RV Percent Paced: 99.99 %
Date Time Interrogation Session: 20231119204017
Implantable Lead Connection Status: 753985
Implantable Lead Connection Status: 753985
Implantable Lead Implant Date: 20130118
Implantable Lead Implant Date: 20130118
Implantable Lead Location: 753859
Implantable Lead Location: 753860
Implantable Lead Model: 5076
Implantable Lead Model: 5076
Implantable Pulse Generator Implant Date: 20230213
Lead Channel Impedance Value: 342 Ohm
Lead Channel Impedance Value: 399 Ohm
Lead Channel Impedance Value: 399 Ohm
Lead Channel Impedance Value: 399 Ohm
Lead Channel Pacing Threshold Amplitude: 0.875 V
Lead Channel Pacing Threshold Amplitude: 1.125 V
Lead Channel Pacing Threshold Pulse Width: 0.4 ms
Lead Channel Pacing Threshold Pulse Width: 0.4 ms
Lead Channel Sensing Intrinsic Amplitude: 2.25 mV
Lead Channel Sensing Intrinsic Amplitude: 2.25 mV
Lead Channel Setting Pacing Amplitude: 2 V
Lead Channel Setting Pacing Amplitude: 2.25 V
Lead Channel Setting Pacing Pulse Width: 0.4 ms
Lead Channel Setting Sensing Sensitivity: 2.8 mV
Zone Setting Status: 755011
Zone Setting Status: 755011

## 2022-10-12 ENCOUNTER — Encounter (HOSPITAL_BASED_OUTPATIENT_CLINIC_OR_DEPARTMENT_OTHER): Payer: Medicare Other | Admitting: General Surgery

## 2022-10-12 DIAGNOSIS — E11621 Type 2 diabetes mellitus with foot ulcer: Secondary | ICD-10-CM | POA: Diagnosis not present

## 2022-10-12 NOTE — Telephone Encounter (Signed)
Hi Dr. Ladona Ridgel and Dr. Eldridge Dace,  Please see [name]'s note below. Vascular surgery has recommended PV angiogram for left lower extremity ulcer that will not heal. Sounds like plan is for a toe amputation if they are not able to successful revascularize her endovascularly as she is not a candidate for open vascular surgery. We will certainly call her to see how she is doing but given she is wheelchair bound, would you recommend a stress test prior to any procedures?  Please route response back to P CV DIV PREOP.  Thank you! Whyatt Klinger

## 2022-10-12 NOTE — Telephone Encounter (Signed)
From reading the chart, I would not pursue ischemic testing as given the entire scenario, I do not think elective cath/PCI adds to her care.  Toe amputation if needed is a low risk surgery.  Her risks are mostly from her age and frailty.  Ischemic testing is unlikely to improve her perioperative risk.

## 2022-10-12 NOTE — Telephone Encounter (Signed)
Agree with Dr. Hedy Jacob.

## 2022-10-13 NOTE — Progress Notes (Signed)
[name], KJOS ([id number]) [id number] Page 1 of 7 Visit Report for 10/12/2022 Arrival Information Details Patient Name: Date of Service: [name], [name] 10/12/2022 2:30 PM Medical Record Number: [id number] Patient Account Number: [id number] Date of Birth/Sex: Treating RN: 08-05-1931 (88 y.o. [name] Primary Care [name]: SYSTEM, PRO V IDER Other Clinician: Referring Sherion Dooly: Treating Averyana Pillars/Extender: Jadene Pierini in Treatment: 36 Visit Information History Since Last Visit Added or deleted any medications: No Patient Arrived: Wheel Chair Any new allergies or adverse reactions: No Arrival Time: 15:00 Had a fall or experienced change in No Accompanied By: son activities of daily living that may affect Transfer Assistance: None risk of falls: Patient Requires Transmission-Based Precautions: No Signs or symptoms of abuse/neglect since last visito No Patient Has Alerts: Yes Hospitalized since last visit: No Patient Alerts: ABI not obtainable Implantable device outside of the clinic excluding No As per [facility] L ABI 0.49 cellular tissue based products placed in the center As per [facility] R ABI 0.74 since last visit: Has Dressing in Place as Prescribed: Yes Pain Present Now: No Electronic Signature(s) Signed: 10/12/2022 5:33:08 PM By: Dellie Catholic RN Entered By: Dellie Catholic on 10/12/2022 15:15:12 -------------------------------------------------------------------------------- Encounter Discharge Information Details Patient Name: Date of Service: [name], [name] Camps. 10/12/2022 2:30 PM Medical Record Number: [id number] Patient Account Number: [id number] Date of Birth/Sex: Treating RN: 12/25/1930 (88 y.o. [name] [place] Primary Care Warrene Kapfer: SYSTEM, PRO Ave Filter Other Clinician: Referring Valinda Fedie: Treating Karriem Muench/Extender: Jadene Pierini in Treatment: 36 Encounter Discharge Information Items Post Procedure  Vitals Discharge Condition: Stable Temperature (F): 98.1 Ambulatory Status: Wheelchair Pulse (bpm): 92 Discharge Destination: Home Respiratory Rate (breaths/min): 18 Transportation: Private Auto Blood Pressure (mmHg): 134/74 Accompanied By: son Schedule Follow-up Appointment: Yes Clinical Summary of Care: Patient Declined Electronic Signature(s) Signed: 10/12/2022 5:33:08 PM By: Dellie Catholic RN Entered By: Dellie Catholic on 10/12/2022 17:24:20 [name] ([id number]) [id number] Page 2 of 7 -------------------------------------------------------------------------------- Lower Extremity Assessment Details Patient Name: Date of Service: [name], [name] 10/12/2022 2:30 PM Medical Record Number: [id number] Patient Account Number: [id number] Date of Birth/Sex: Treating RN: [birth year] (88 y.o. [name] Febles Primary Care Inna Tisdell: SYSTEM, PRO Ave Filter Other Clinician: Referring Mannie Wineland: Treating Ronisha Herringshaw/Extender: Jadene Pierini in Treatment: 36 Edema Assessment Assessed: [Left: No] [Right: No] [Left: Edema] [Right: :] Calf Left: Right: Point of Measurement: 26 cm From Medial Instep Ankle Left: Right: Point of Measurement: 10 cm From Medial Instep Vascular Assessment Pulses: Dorsalis Pedis Palpable: [Left:Yes] Electronic Signature(s) Signed: 10/12/2022 5:33:08 PM By: Dellie Catholic RN Entered By: Dellie Catholic on 10/12/2022 15:16:07 -------------------------------------------------------------------------------- Multi Wound Chart Details Patient Name: Date of Service: [name]. 10/12/2022 2:30 PM Medical Record Number: [id number] Patient Account Number: [id number] Date of Birth/Sex: Treating RN: January 07, 1931 (88 y.o. F) Primary Care Artemio Dobie: SYSTEM, PRO V IDER Other Clinician: Referring Taja Pentland: Treating Anisa Leanos/Extender: Jadene Pierini in Treatment: 36 Vital Signs Height(in): Pulse(bpm):  50 Weight(lbs): Blood Pressure(mmHg): 134/74 Body Mass Index(BMI): Temperature(F): 98.1 Respiratory Rate(breaths/min): 18 [1:Photos: No Photos Left T Great oe Wound Location: Gradually Appeared Wounding Event: Diabetic Wound/Ulcer of the Lower Primary Etiology: Extremity Arrhythmia, Congestive Heart Failure, Comorbid History: Hypertension, Type II Diabetes, Gout, Osteoarthritis  09/29/2021 Date Acquired:] [N/A:N/A N/A N/A N/A N/A N/A] [name] ([id number]) [1:36 Weeks of Treatment: Open Wound Status: No Wound Recurrence: 0.3x0.3x0.1 Measurements L x W x D (cm) 0.071 A (cm) : rea 0.007 Volume (cm) : 74.90% % Reduction in A rea: 87.70% % Reduction in Volume: Grade  2 Classification: Small Exudate A mount:  Serous Exudate Type: amber Exudate Color: Flat and Intact Wound Margin: Small (1-33%) Granulation A mount: Pink Granulation Quality: Large (67-100%) Necrotic A mount: Eschar, Adherent Slough Necrotic Tissue: Fat Layer (Subcutaneous Tissue): Yes N/A  Exposed Structures: Fascia: No Tendon: No Muscle: No Joint: No Bone: No Small (1-33%) Epithelialization: Debridement - Selective/Open Wound N/A Debridement: Pre-procedure Verification/Time Out 15:15 Taken: Lidocaine 5% topical ointment Pain Control:  Necrotic/Eschar Tissue Debrided: Non-Viable Tissue Level: 0.09 Debridement A (sq cm): rea Curette Instrument: Minimum Bleeding: Pressure Hemostasis A chieved: 0 Procedural Pain: 0 Post Procedural Pain: Procedure was tolerated well Debridement Treatment  Response: 0.3x0.3x0.1 Post Debridement Measurements L x W x D (cm) 0.007 Post Debridement Volume: (cm) Induration: No Periwound Skin Texture: No Abnormalities Noted Periwound Skin Moisture: No Abnormalities Noted Periwound Skin Color: No Abnormality  Temperature: Debridement Procedures Performed:] [N/A:N/A N/A N/A N/A N/A N/A N/A N/A N/A N/A N/A N/A N/A N/A N/A N/A N/A N/A N/A N/A N/A N/A N/A N/A N/A N/A N/A N/A N/A N/A N/A N/A N/A N/A N/A  N/A] Treatment Notes Electronic Signature(s) Signed: 10/12/2022 3:40:18 PM By: Fredirick Maudlin MD FACS Entered By: Fredirick Maudlin on 10/12/2022 15:40:18 -------------------------------------------------------------------------------- Multi-Disciplinary Care Plan Details Patient Name: Date of Service: [name], [name] Camps. 10/12/2022 2:30 PM Medical Record Number: [id number] Patient Account Number: [id number] Date of Birth/Sex: Treating RN: 12-16-1930 (88 y.o. [name] Bair Primary Care Sloane Junkin: SYSTEM, PRO Ave Filter Other Clinician: Referring Dillan Lunden: Treating Saraiyah Hemminger/Extender: Jadene Pierini in Treatment: [place] reviewed with physician Active Inactive Nutrition Nursing Diagnoses: Impaired glucose control: actual or potential Potential for alteratiion in Nutrition/Potential for imbalanced nutrition ZIYA, COONROD ([id number]) [id number] Page 4 of 7 Goals: Patient/caregiver will maintain therapeutic glucose control Date Initiated: 02/01/2022 Target Resolution Date: 01/19/2023 Goal Status: Active Interventions: Assess HgA1c results as ordered upon admission and as needed Assess patient nutrition upon admission and as needed per policy Provide education on elevated blood sugars and impact on wound healing Provide education on nutrition Treatment Activities: Education provided on Nutrition : 02/01/2022 Notes: Wound/Skin Impairment Nursing Diagnoses: Impaired tissue integrity Knowledge deficit related to ulceration/compromised skin integrity Goals: Patient/caregiver will verbalize understanding of skin care regimen Date Initiated: 02/01/2022 Date Inactivated: 05/18/2022 Target Resolution Date: 04/28/2022 Goal Status: Met Ulcer/skin breakdown will have a volume reduction of 30% by week 4 Date Initiated: 02/01/2022 Date Inactivated: 03/24/2022 Target Resolution Date: 03/31/2022 Goal Status: Unmet Unmet Reason:  PAD Ulcer/skin breakdown will have a volume reduction of 50% by week 8 Date Initiated: 04/06/2022 Target Resolution Date: 01/19/2023 Goal Status: Active Interventions: Assess patient/caregiver ability to obtain necessary supplies Assess patient/caregiver ability to perform ulcer/skin care regimen upon admission and as needed Assess ulceration(s) every visit Provide education on ulcer and skin care Notes: Electronic Signature(s) Signed: 10/12/2022 5:33:08 PM By: Dellie Catholic RN Entered By: Dellie Catholic on 10/12/2022 17:22:30 -------------------------------------------------------------------------------- Pain Assessment Details Patient Name: Date of Service: [name] 10/12/2022 2:30 PM Medical Record Number: [id number] Patient Account Number: [id number] Date of Birth/Sex: Treating RN: 1931/04/02 (88 y.o. [name] Wain Primary Care Zanylah Hardie: SYSTEM, PRO Ave Filter Other Clinician: Referring Sherrita Riederer: Treating Brian Zeitlin/Extender: Jadene Pierini in Treatment: 36 Active Problems Location of Pain Severity and Description of Pain Patient Has Paino No Site Locations [place], [place] S ([id number]) [id number] Page 5 of 7 Pain Management and Medication Current Pain Management: Electronic Signature(s) Signed: 10/12/2022 5:33:08 PM By: Dellie Catholic RN Entered By: Dellie Catholic on 10/12/2022 15:15:45 -------------------------------------------------------------------------------- Patient/Caregiver  Education Details Patient Name: Date of Service: ANEIRA, CAVITT 11/22/2023andnbsp2:30 PM Medical Record Number: [id number] Patient Account Number: [id number] Date of Birth/Gender: Treating RN: 08/04/1931 (88 y.o. [name] Tirpak Primary Care Physician: SYSTEM, PRO V IDER Other Clinician: Referring Physician: Treating Physician/Extender: Jadene Pierini in Treatment: 72 Education Assessment Education Provided  To: Patient Education Topics Provided Wound/Skin Impairment: Methods: Explain/Verbal Responses: Return demonstration correctly Electronic Signature(s) Signed: 10/12/2022 5:33:08 PM By: Dellie Catholic RN Entered By: Dellie Catholic on 10/12/2022 17:22:45 -------------------------------------------------------------------------------- Wound Assessment Details Patient Name: Date of Service: [name]. 10/12/2022 2:30 PM Medical Record Number: [id number] Patient Account Number: [id number] Date of Birth/Sex: Treating RN: 07-Apr-1931 (88 y.o. [name] Hollander Primary Care Metzli Pollick: SYSTEM, PRO Ave Filter Other Clinician: Referring Tevon Berhane: Treating Keeshawn Fakhouri/Extender: Tomiko, Schoon, [name] Camps ([id number]) [id number] Page 6 of 7 Weeks in Treatment: 36 Wound Status Wound Number: 1 Primary Diabetic Wound/Ulcer of the Lower Extremity Etiology: Wound Location: Left T Great oe Wound Open Wounding Event: Gradually Appeared Status: Date Acquired: 09/29/2021 Comorbid Arrhythmia, Congestive Heart Failure, Hypertension, Type II Weeks Of Treatment: 36 History: Diabetes, Gout, Osteoarthritis Clustered Wound: No Wound Measurements Length: (cm) 0.3 Width: (cm) 0.3 Depth: (cm) 0.1 Area: (cm) 0.071 Volume: (cm) 0.007 % Reduction in Area: 74.9% % Reduction in Volume: 87.7% Epithelialization: Small (1-33%) Tunneling: No Undermining: No Wound Description Classification: Grade 2 Wound Margin: Flat and Intact Exudate Amount: Small Exudate Type: Serous Exudate Color: amber Foul Odor After Cleansing: No Slough/Fibrino Yes Wound Bed Granulation Amount: Small (1-33%) Exposed Structure Granulation Quality: Pink Fascia Exposed: No Necrotic Amount: Large (67-100%) Fat Layer (Subcutaneous Tissue) Exposed: Yes Necrotic Quality: Eschar, Adherent Slough Tendon Exposed: No Muscle Exposed: No Joint Exposed: No Bone Exposed: No Periwound Skin  Texture Texture Color No Abnormalities Noted: Yes No Abnormalities Noted: Yes Moisture Temperature / Pain No Abnormalities Noted: Yes Temperature: No Abnormality Treatment Notes Wound #1 (Toe Great) Wound Laterality: Left Cleanser Wound Cleanser Discharge Instruction: Cleanse the wound with wound cleanser prior to applying a clean dressing using gauze sponges, not tissue or cotton balls. Peri-Wound Care Topical Primary Dressing KerraCel Ag Gelling Fiber Dressing, 2x2 in (silver alginate) Discharge Instruction: apply to wound bed Optifoam Non-Adhesive Dressing, 4x4 in Discharge Instruction: Apply to Left Great toe to protect toe Secondary Dressing Optifoam Non-Adhesive Dressing, 4x4 in Discharge Instruction: Apply over primary dressing can use an Alleyn for padding Woven Gauze Sponges 2x2 in Discharge Instruction: Apply over primary dressing as directed. Secured With Conforming Stretch Gauze Bandage, Sterile 2x75 (in/in) Discharge Instruction: Secure with stretch gauze as directed. Paper Tape, 1x10 (in/yd) Discharge Instruction: Secure dressing with tape as directed. Compression Wrap Compression Stockings LYNDSEE, CASA ([id number]) [id number] Page 7 of 7 Add-Ons Electronic Signature(s) Signed: 10/12/2022 5:33:08 PM By: Dellie Catholic RN Entered By: Dellie Catholic on 10/12/2022 14:58:09 -------------------------------------------------------------------------------- Vitals Details Patient Name: Date of Service: [name], [name] Camps. 10/12/2022 2:30 PM Medical Record Number: [id number] Patient Account Number: [id number] Date of Birth/Sex: Treating RN: 22-Jan-1931 (88 y.o. [name] Lashway Primary Care Holliday Sheaffer: SYSTEM, PRO V IDER Other Clinician: Referring Karla Vines: Treating Adrain Nesbit/Extender: Jadene Pierini in Treatment: 36 Vital Signs Time Taken: 15:01 Temperature (F): 98.1 Pulse (bpm): 92 Respiratory Rate (breaths/min):  18 Blood Pressure (mmHg): 134/74 Reference Range: 80 - 120 mg / dl Electronic Signature(s) Signed: 10/12/2022 5:33:08 PM By: Dellie Catholic RN Entered By: Dellie Catholic on 10/12/2022 15:15:38

## 2022-10-13 NOTE — Progress Notes (Signed)
NALANIE, [name] ([id number]) [id number] Page 1 of 9 Visit Report for 10/12/2022 Chief Complaint Document Details Patient Name: Date of Service: [name] 10/12/2022 2:30 PM Medical Record Number: [id number] Patient Account Number: [id number] Date of Birth/Sex: Treating RN: 1931/11/02 (88 y.o. F) Primary Care Provider: SYSTEM, PRO V IDER Other Clinician: Referring Provider: Treating Provider/Extender: [name] in Treatment: 36 Information Obtained from: Patient Chief Complaint Patient presents to the wound care center with open non-healing surgical wound(s) Electronic Signature(s) Signed: 10/12/2022 3:40:25 PM By: Fredirick Maudlin MD FACS Entered By: Fredirick Maudlin on 10/12/2022 15:40:25 -------------------------------------------------------------------------------- Debridement Details Patient Name: Date of Service: [name]. 10/12/2022 2:30 PM Medical Record Number: [id number] Patient Account Number: [id number] Date of Birth/Sex: Treating RN: 1931-07-06 (88 y.o. [name] [name] Primary Care Provider: SYSTEM, PRO V IDER Other Clinician: Referring Provider: Treating Provider/Extender: [name] in Treatment: 36 Debridement Performed for Assessment: Wound #1 Left T Great oe Performed By: Physician Fredirick Maudlin, MD Debridement Type: Debridement Severity of Tissue Pre Debridement: Fat layer exposed Level of Consciousness (Pre-procedure): Awake and Alert Pre-procedure Verification/Time Out Yes - 15:15 Taken: Start Time: 15:15 Pain Control: Lidocaine 5% topical ointment T Area Debrided (L x W): otal 0.3 (cm) x 0.3 (cm) = 0.09 (cm) Tissue and other material debrided: Non-Viable, Eschar Level: Non-Viable Tissue Debridement Description: Selective/Open Wound Instrument: Curette Bleeding: Minimum Hemostasis Achieved: Pressure End Time: 15:16 Procedural Pain: 0 Post Procedural Pain: 0 Response to  Treatment: Procedure was tolerated well Level of Consciousness (Post- Awake and Alert procedure): Post Debridement Measurements of Total Wound Length: (cm) 0.3 Width: (cm) 0.3 Depth: (cm) 0.1 Volume: (cm) 0.007 Character of Wound/Ulcer Post Debridement: Improved Severity of Tissue Post Debridement: Fat layer exposed [name] ([id number]) [id number] Page 2 of 9 Post Procedure Diagnosis Same as Pre-procedure Notes Scribed fior Dr. Celine Ahr by J.S. Electronic Signature(s) Signed: 10/12/2022 5:15:30 PM By: Fredirick Maudlin MD FACS Signed: 10/12/2022 5:33:08 PM By: Dellie Catholic RN Entered By: Dellie Catholic on 10/12/2022 15:30:42 -------------------------------------------------------------------------------- HPI Details Patient Name: Date of Service: [name], [name]. 10/12/2022 2:30 PM Medical Record Number: [id number] Patient Account Number: [id number] Date of Birth/Sex: Treating RN: 1931/06/01 (88 y.o. F) Primary Care Provider: SYSTEM, PRO V IDER Other Clinician: Referring Provider: Treating Provider/Extender: [name] in Treatment: 36 History of Present Illness HPI Description: ADMISSION 02/01/2022 This is an 88 year old woman who is a resident of [place]. She has a past medical history notable for type 2 diabetes mellitus, congestive heart failure, pulmonary hypertension, and complete heart block. She is accompanied today by her son. Approximately 4 months ago, she had an ingrown toe nail removed. Apparently this went a bit deep. Since that time, it has not healed. The patient is nonambulatory. Apparently they have been applying Santyl to the site at her facility. She was on antibiotics but I am not certain which specific drugs she took. She completed her last course a few weeks ago. We were unable to obtain ABIs in clinic secondary to the patient's body habitus. She has not had any formal vascular studies nor any x-ray of  the area. 02/09/2022: The wound is roughly the same size today, but the amount of slough in the base has decreased significantly. She apparently had an x-ray done at her facility, but we do not have the report; per verbal report from facility staff, "there was nothing there." We had requested formal ABI/TBI studies, but these were not done. She has been in [place] under  [employer]. 02/16/2022: The wound measured about the same today, but to my eye, it appears rather smaller. The slough in the base is minimal. Her vascular studies are scheduled for Friday. We have been using Santyl under [employer]. No concern for infection. 02/23/2022: Once again, the wound has been measured about the same size, but I think it looks a bit smaller. There is slough at the base. She had her ABIs done last week and these show severe bilateral peripheral arterial disease. We received the report of the foot x-ray that was ordered. There is no concern for osteomyelitis at this time. 03/02/2022: The wound is smaller today with a small amount of slough at the base. We placed a referral to vein and vascular surgery last week in response to her severe peripheral arterial disease, but she does not yet have an appointment on the books. 03/16/2022: She saw Dr. Stanford Breed in the vascular surgery department yesterday. He did not think she was a good candidate for any revascularization options and suggested that she continue local wound care for another month. If she fails to heal in that time, they may consider an angiogram with potential intervention. The wound today is roughly the same size but there is less exposed bone at the base and the surface is clean without any substantial slough. 03/24/2022: The wound appears about the same. There is some periwound callus that has heaped up. We changed her to Prisma silver collagen last week. 04/06/2022: No significant change to the wound. It is neither improving nor deteriorating. She continues  to accumulate some periwound callus around the margin. Bone is exposed at the base. 04/20/2022: The wound has improved quite a bit over the past 2 weeks. It is smaller and there is no real accumulation of periwound callus. She had follow-up with Dr. Stanford Breed yesterday and he felt that the wound was progressing well enough that no arterial intervention was necessary. 05/18/2022: The patient has been absent due to a fall from her wheelchair during her last transportation that was supposed to be to her clinic. Today, the wound is about the same size. There is still bone exposed at the base. There is periwound callus accumulation. No concern for infection. 06/01/2022: The wound is actually a little bit smaller today and the surface is not as pale. She still has accumulated periwound callus. 06/15/2022: The wound continues to contract, albeit slowly. No concern for infection. 06/28/2022: The wound is slightly smaller again today. She continues to accumulate callus and there is some periwound epibole at the margins. No concern for gross infection. 07/11/2022: The wound is roughly the same size with just a small opening in the center. She has been approved for Grafix. 07/18/2022: The wound has contracted somewhat and the surface has a healthier pink look to it. We applied Grafix last week and she is here for application #2. [id number]: The wound is about the same size but the surface continues to improve and appear more vital. She is here for Grafix application #3. [id number]: There is still a stubborn central portion of her toe wound that is not closing in yet. The bone remains exposed. She is here for Grafix application #4. [name], [name] ([id number]) [id number] Page 3 of 9 08/11/2022: Today when I debrided the eschar and callus from her wound, purulent drainage was expressed from her wound. The underlying surface is unchanged with bone exposed. She has been having more pain in the toe and  this may explain why. 08/18/2022: The culture  that I took last week grew out Candida and Acinetobacter. We changed her dressing regimen to terbinafine instead of gentamicin and I prescribed levofloxacin instead of Augmentin. T oday, the wound does not look that much different. It remains small with granulation tissue at the base, but bone remains exposed. 09/01/2022: The wound is about the same. I had debrided a fair amount of periwound callus and nonviable skin last week, so it does measure a little bit larger. The bone is still exposed. No obvious signs of infection. I requested a plain x-ray of the patient's foot a month ago and still have not received the results of that study, performed at her nursing facility. She continues to complain of pain in her toe and I am concerned that she has osteomyelitis. Based upon her previous ABIs, she does not have adequate blood flow to heal an amputation and she has failed to make much progress in terms of her wound healing. 09/15/2022: I did receive the results of the x-ray, but no actual images. No osteomyelitis was identified on the film. We referred her back to vascular surgery, but she does not yet have an appointment. The wound is unchanged. 09/29/2022: She was seen by Dr. Stanford Breed on November 7. He has offered her an angiogram with intervention if feasible. She is not sure if she is comfortable with going forward. Her wound is completely unchanged. 10/12/2022: The wound is unchanged. It sounds like there has been a fair amount of discussion between the patient, her daughter, Dr. Stanford Breed, and cardiology. It honestly sounds like the patient is not interested in any sort of vascular intervention. She is concerned about the possibility of stent placement, as well as the need for diet and its potential effect on her kidneys. I told the patient and her daughter, who was present via speaker phone, without better blood flow, I did not think this wound had a chance of  healing. Electronic Signature(s) Signed: 10/12/2022 3:42:59 PM By: Fredirick Maudlin MD FACS Entered By: Fredirick Maudlin on 10/12/2022 15:42:59 -------------------------------------------------------------------------------- Physical Exam Details Patient Name: Date of Service: [name]. 10/12/2022 2:30 PM Medical Record Number: [id number] Patient Account Number: [id number] Date of Birth/Sex: Treating RN: [birth year] (88 y.o. F) Primary Care Provider: SYSTEM, PRO V IDER Other Clinician: Referring Provider: Treating Provider/Extender: [name] in Treatment: 36 Constitutional . . . . No acute distress. Respiratory Normal work of breathing on room air. Notes 10/12/2022: Once again, there has been no change in the wound. Electronic Signature(s) Signed: 10/12/2022 3:43:42 PM By: Fredirick Maudlin MD FACS Entered By: Fredirick Maudlin on 10/12/2022 15:43:42 -------------------------------------------------------------------------------- Physician Orders Details Patient Name: Date of Service: [name], [name]. 10/12/2022 2:30 PM Medical Record Number: [id number] Patient Account Number: [id number] Date of Birth/Sex: Treating RN: 08-26-1931 (88 y.o. America Boniface Primary Care Provider: SYSTEM, PRO Ave Filter Other Clinician: Referring Provider: Treating Provider/Extender: [name] in Treatment: 64 Verbal / Phone Orders: No Diagnosis [address] GINNIFER, ONCALE ([id number]) [id number] Page 4 of 9 ICD-10 Coding Code Description E11.621 Type 2 diabetes mellitus with foot ulcer L97.522 Non-pressure chronic ulcer of other part of left foot with fat layer exposed 99991111 Chronic diastolic (congestive) heart failure I27.20 Pulmonary hypertension, unspecified Follow-up Appointments Return appointment in 1 month. - Dr. Celine Ahr Room 3 Anesthetic (In clinic) Topical Lidocaine 4% applied to wound bed - In Clinic Off-Loading Other: -  No direct pressure on toes Wound Treatment Wound #1 - T Great oe Wound Laterality: Left Cleanser: Wound Cleanser  Every Other Day/30 Days Discharge Instructions: Cleanse the wound with wound cleanser prior to applying a clean dressing using gauze sponges, not tissue or cotton balls. Prim Dressing: KerraCel Ag Gelling Fiber Dressing, 2x2 in (silver alginate) ary Every Other Day/30 Days Discharge Instructions: apply to wound bed Prim Dressing: Optifoam Non-Adhesive Dressing, 4x4 in Every Other Day/30 Days ary Discharge Instructions: Apply to Left Great toe to protect toe Secondary Dressing: Optifoam Non-Adhesive Dressing, 4x4 in Every Other Day/30 Days Discharge Instructions: Apply over primary dressing can use an Alleyn for padding Secondary Dressing: Woven Gauze Sponges 2x2 in Every Other Day/30 Days Discharge Instructions: Apply over primary dressing as directed. Secured With: Child psychotherapist, Sterile 2x75 (in/in) Every Other Day/30 Days Discharge Instructions: Secure with stretch gauze as directed. Secured With: Paper Tape, 1x10 (in/yd) Every Other Day/30 Days Discharge Instructions: Secure dressing with tape as directed. [place] - Referral for a second opinion on wound care treatment, regarding Left great toe wound. ICD 10: L97.522 Electronic Signature(s) Signed: 10/12/2022 5:15:30 PM By: Fredirick Maudlin MD FACS Entered By: Fredirick Maudlin on 10/12/2022 15:43:52 Prescription 10/12/2022 -------------------------------------------------------------------------------- [name] Dooms. Fredirick Maudlin MD Patient Name: Provider: 28-Apr-1931 [id number] Date of Birth: NPI#: [id number] Sex: DEA #: [id number] Phone #: License #: [place] Patient Address: [address], [place] [address] [place], [place]  [address] [phone] Allergies aspirin; tramadol; naproxen; shellfish derived [name], [name] ([id number]) [id number] Page 5 of 9 Provider's Orders [place] - Referral for a second opinion on wound care treatment, regarding Left great toe wound. ICD 10: L97.522 Hand Signature: Date(s): Electronic Signature(s) Signed: 10/12/2022 3:47:48 PM By: Fredirick Maudlin MD FACS Entered By: Fredirick Maudlin on 10/12/2022 15:47:48 -------------------------------------------------------------------------------- Problem List Details Patient Name: Date of Service: [name], [name]. 10/12/2022 2:30 PM Medical Record Number: [id number] Patient Account Number: [id number] Date of Birth/Sex: Treating RN: May 27, 1931 (88 y.o. F) Primary Care Provider: SYSTEM, PRO V IDER Other Clinician: Referring Provider: Treating Provider/Extender: [name] in Treatment: 36 Active Problems ICD-10 Encounter Code Description Active Date MDM Diagnosis E11.621 Type 2 diabetes mellitus with foot ulcer 02/01/2022 No Yes L97.522 Non-pressure chronic ulcer of other part of left foot with fat layer exposed 02/01/2022 No Yes [id number] Chronic diastolic (congestive) heart failure 02/01/2022 No Yes I27.20 Pulmonary hypertension, unspecified 02/01/2022 No Yes Inactive Problems Resolved Problems Electronic Signature(s) Signed: 10/12/2022 3:40:12 PM By: Fredirick Maudlin MD FACS Entered By: Fredirick Maudlin on 10/12/2022 15:40:12 -------------------------------------------------------------------------------- Progress Note Details Patient Name: Date of Service: [name]. 10/12/2022 2:30 PM Medical Record Number: [id number] Patient Account Number: [id number] Date of Birth/Sex: Treating RN: 01-19-1931 (88 y.o. F) Primary Care Provider: SYSTEM, PRO V IDER Other Clinician: Referring Provider: Treating Provider/Extender: [name] in Treatment: [address],  [place] ([id number]) [id number] Page 6 of 9 Subjective Chief Complaint Information obtained from Patient Patient presents to the wound care center with open non-healing surgical wound(s) History of Present Illness (HPI) ADMISSION 02/01/2022 This is an 88 year old woman who is a resident of [place]. She has a past medical history notable for type 2 diabetes mellitus, congestive heart failure, pulmonary hypertension, and complete heart block. She is accompanied today by her son. Approximately 4 months ago, she had an ingrown toe nail removed. Apparently this went a bit deep. Since that time, it has not healed. The patient is nonambulatory.  Apparently they have been applying Santyl to the site at her facility. She was on antibiotics but I am not certain which specific drugs she took. She completed her last course a few weeks ago. We were unable to obtain ABIs in clinic secondary to the patient's body habitus. She has not had any formal vascular studies nor any x-ray of the area. 02/09/2022: The wound is roughly the same size today, but the amount of slough in the base has decreased significantly. She apparently had an x-ray done at her facility, but we do not have the report; per verbal report from facility staff, "there was nothing there." We had requested formal ABI/TBI studies, but these were not done. She has been in [place] under [employer]. 02/16/2022: The wound measured about the same today, but to my eye, it appears rather smaller. The slough in the base is minimal. Her vascular studies are scheduled for Friday. We have been using Santyl under Hydrofera Blue. No concern for infection. 02/23/2022: Once again, the wound has been measured about the same size, but I think it looks a bit smaller. There is slough at the base. She had her ABIs done last week and these show severe bilateral peripheral arterial disease. We received the report of the foot x-ray that was  ordered. There is no concern for osteomyelitis at this time. 03/02/2022: The wound is smaller today with a small amount of slough at the base. We placed a referral to vein and vascular surgery last week in response to her severe peripheral arterial disease, but she does not yet have an appointment on the books. 03/16/2022: She saw Dr. Stanford Breed in the vascular surgery department yesterday. He did not think she was a good candidate for any revascularization options and suggested that she continue local wound care for another month. If she fails to heal in that time, they may consider an angiogram with potential intervention. The wound today is roughly the same size but there is less exposed bone at the base and the surface is clean without any substantial slough. 03/24/2022: The wound appears about the same. There is some periwound callus that has heaped up. We changed her to Prisma silver collagen last week. 04/06/2022: No significant change to the wound. It is neither improving nor deteriorating. She continues to accumulate some periwound callus around the margin. Bone is exposed at the base. 04/20/2022: The wound has improved quite a bit over the past 2 weeks. It is smaller and there is no real accumulation of periwound callus. She had follow-up with Dr. Stanford Breed yesterday and he felt that the wound was progressing well enough that no arterial intervention was necessary. 05/18/2022: The patient has been absent due to a fall from her wheelchair during her last transportation that was supposed to be to her clinic. Today, the wound is about the same size. There is still bone exposed at the base. There is periwound callus accumulation. No concern for infection. 06/01/2022: The wound is actually a little bit smaller today and the surface is not as pale. She still has accumulated periwound callus. 06/15/2022: The wound continues to contract, albeit slowly. No concern for infection. 06/28/2022: The wound is slightly  smaller again today. She continues to accumulate callus and there is some periwound epibole at the margins. No concern for gross infection. 07/11/2022: The wound is roughly the same size with just a small opening in the center. She has been approved for Grafix. 07/18/2022: The wound has contracted somewhat and the surface  has a healthier pink look to it. We applied Grafix last week and she is here for application #2. [id number]: The wound is about the same size but the surface continues to improve and appear more vital. She is here for Grafix application #3. [id number]: There is still a stubborn central portion of her toe wound that is not closing in yet. The bone remains exposed. She is here for Grafix application #4. [id number]: Today when I debrided the eschar and callus from her wound, purulent drainage was expressed from her wound. The underlying surface is unchanged with bone exposed. She has been having more pain in the toe and this may explain why. 08/18/2022: The culture that I took last week grew out Candida and Acinetobacter. We changed her dressing regimen to terbinafine instead of gentamicin and I prescribed levofloxacin instead of Augmentin. T oday, the wound does not look that much different. It remains small with granulation tissue at the base, but bone remains exposed. 09/01/2022: The wound is about the same. I had debrided a fair amount of periwound callus and nonviable skin last week, so it does measure a little bit larger. The bone is still exposed. No obvious signs of infection. I requested a plain x-ray of the patient's foot a month ago and still have not received the results of that study, performed at her nursing facility. She continues to complain of pain in her toe and I am concerned that she has osteomyelitis. Based upon her previous ABIs, she does not have adequate blood flow to heal an amputation and she has failed to make much progress in terms of her wound healing. 09/15/2022:  I did receive the results of the x-ray, but no actual images. No osteomyelitis was identified on the film. We referred her back to vascular surgery, but she does not yet have an appointment. The wound is unchanged. 09/29/2022: She was seen by Dr. Stanford Breed on November 7. He has offered her an angiogram with intervention if feasible. She is not sure if she is comfortable with going forward. Her wound is completely unchanged. 10/12/2022: The wound is unchanged. It sounds like there has been a fair amount of discussion between the patient, her daughter, Dr. Stanford Breed, and cardiology. It honestly sounds like the patient is not interested in any sort of vascular intervention. She is concerned about the possibility of stent placement, as well as the need for diet and its potential effect on her kidneys. I told the patient and her daughter, who was present via speaker phone, without better blood flow, I did not think this wound had a chance of healing. Patient History Social History Never smoker, Marital Status - Widowed, Alcohol Use - Never, Drug Use - No History, Caffeine Use - Rarely. Medical History [name], [name] ([id number]) [id number] Page 7 of 9 Cardiovascular Patient has history of Arrhythmia - AV block, complete heart block, Congestive Heart Failure, Hypertension Endocrine Patient has history of Type II Diabetes Musculoskeletal Patient has history of Gout, Osteoarthritis Medical A Surgical History Notes nd Cardiovascular Pulmonary Hypertension Endocrine Hypoglycemia Neurologic Acute encephalopathy Objective Constitutional No acute distress. Vitals Time Taken: 3:01 PM, Temperature: 98.1 F, Pulse: 92 bpm, Respiratory Rate: 18 breaths/min, Blood Pressure: 134/74 mmHg. Respiratory Normal work of breathing on room air. General Notes: 10/12/2022: Once again, there has been no change in the wound. Integumentary (Hair, Skin) Wound #1 status is Open. Original  cause of wound was Gradually Appeared. The date acquired was: 09/29/2021. The wound has been in treatment 36 weeks.  The wound is located on the Left T Great. The wound measures 0.3cm length x 0.3cm width x 0.1cm depth; 0.071cm^2 area and 0.007cm^3 volume. There is Fat oe Layer (Subcutaneous Tissue) exposed. There is no tunneling or undermining noted. There is a small amount of serous drainage noted. The wound margin is flat and intact. There is small (1-33%) pink granulation within the wound bed. There is a large (67-100%) amount of necrotic tissue within the wound bed including Eschar and Adherent Slough. The periwound skin appearance had no abnormalities noted for texture. The periwound skin appearance had no abnormalities noted for moisture. The periwound skin appearance had no abnormalities noted for color. Periwound temperature was noted as No Abnormality. 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 There was a Selective/Open Wound Non-Viable Tissue Debridement with a total area of 0.09 sq cm performed by Fredirick Maudlin, MD. With the following instrument(s): Curette to remove Non-Viable tissue/material. Material removed includes Eschar after achieving pain control using Lidocaine 5% topical ointment. No specimens were taken. A time out was conducted at 15:15, prior to the start of the procedure. A Minimum amount of bleeding was controlled with Pressure. The procedure was tolerated well with a pain level of 0 throughout and a pain level of 0 following the procedure. Post Debridement Measurements: 0.3cm length x  0.3cm width x 0.1cm depth; 0.007cm^3 volume. Character of Wound/Ulcer Post Debridement is improved. Severity of Tissue Post Debridement is: Fat layer exposed. Post procedure Diagnosis Wound #1: Same as Pre-Procedure General Notes: Scribed fior Dr. Celine Ahr by J.S.. Plan Follow-up Appointments: Return appointment in 1 month. - Dr. Celine Ahr Room 3 Anesthetic: [name], [name] ([id number]) [id number] Page 8 of 9 (In clinic) Topical Lidocaine 4% applied to wound bed - In Clinic Off-Loading: Other: - No direct pressure on toes Consults ordered were: [place] - Referral for a second opinion on wound care treatment, regarding Left great toe wound. ICD 10: L97.522 WOUND #1: - T Great Wound Laterality: Left oe Cleanser: Wound Cleanser Every Other Day/30 Days Discharge Instructions: Cleanse the wound with wound cleanser prior to applying a clean dressing using gauze sponges, not tissue or cotton balls. Prim Dressing: KerraCel Ag Gelling Fiber Dressing, 2x2 in (silver alginate) Every Other Day/30 Days ary Discharge Instructions: apply to wound bed Prim Dressing: Optifoam Non-Adhesive Dressing, 4x4 in Every Other Day/30 Days ary Discharge Instructions: Apply to Left Great toe to protect toe Secondary Dressing: Optifoam Non-Adhesive Dressing, 4x4 in Every Other Day/30 Days Discharge Instructions: Apply over primary dressing can use an Alleyn for padding Secondary Dressing: Woven Gauze Sponges 2x2 in Every Other Day/30 Days Discharge Instructions: Apply over primary dressing as directed. Secured With: Child psychotherapist, Sterile 2x75 (in/in) Every Other Day/30 Days Discharge Instructions: Secure with stretch gauze as directed. Secured With: Paper Tape, 1x10 (in/yd) Every Other Day/30 Days Discharge Instructions: Secure dressing with tape as directed. 10/12/2022: The wound is unchanged. I used a curette to debride some eschar from the wound  surface. In discussion with the patient and her daughter, it sounds like there is little interest in pursuing any vascular intervention. Without better blood flow, I do not think the  wound will heal and I also do not think she has adequate blood flow to heal an amputation so I think her best and most reasonable goal is to try and avoid infection and control pain. We will continue with silver alginate dressing changes. I think we can extend our visits to once a month, as the patient does want to continue to be seen. I did offer her a second opinion at another wound care clinic but at this point, the patient prefers to be seen here. Electronic Signature(s) Signed: 10/12/2022 3:46:58 PM By: Duanne Guess MD FACS Entered By: Duanne Guess on 10/12/2022 15:46:58 -------------------------------------------------------------------------------- HxROS Details Patient Name: Date of Service: [name], [name]. 10/12/2022 2:30 PM Medical Record Number: [id number] Patient Account Number: [id number] Date of Birth/Sex: Treating RN: [birth year] (88 y.o. F) Primary Care Provider: SYSTEM, PRO V IDER Other Clinician: Referring Provider: Treating Provider/Extender: [name] in Treatment: 36 Cardiovascular Medical History: Positive for: Arrhythmia - AV block, complete heart block; Congestive Heart Failure; Hypertension Past Medical History Notes: Pulmonary Hypertension Endocrine Medical History: Positive for: Type II Diabetes Past Medical History Notes: Hypoglycemia Treated with: Oral agents Blood sugar tested every day: Yes Tested : 2-3x a day Musculoskeletal Medical History: Positive for: Gout; Osteoarthritis Neurologic Medical History: Past Medical History Notes: Acute encephalopathy Immunizations MARGRET, MOAT ([id number]) [id number] Page 9 of 9 Pneumococcal Vaccine: Received Pneumococcal Vaccination: No Implantable Devices No devices  added Family and Social History Never smoker; Marital Status - Widowed; Alcohol Use: Never; Drug Use: No History; Caffeine Use: Rarely; Financial Concerns: No; Food, Clothing or Shelter Needs: No; Support System Lacking: No; Transportation Concerns: No Electronic Signature(s) Signed: 10/12/2022 5:15:30 PM By: Duanne Guess MD FACS Entered By: Duanne Guess on 10/12/2022 15:43:15 -------------------------------------------------------------------------------- SuperBill Details Patient Name: Date of Service: [name] 10/12/2022 Medical Record Number: [id number] Patient Account Number: [id number] Date of Birth/Sex: Treating RN: 06-19-1931 (88 y.o. F) Primary Care Provider: SYSTEM, PRO V IDER Other Clinician: Referring Provider: Treating Provider/Extender: [name] in Treatment: 36 Diagnosis Coding ICD-10 Codes Code Description E11.621 Type 2 diabetes mellitus with foot ulcer L97.522 Non-pressure chronic ulcer of other part of left foot with fat layer exposed I50.32 Chronic diastolic (congestive) heart failure I27.20 Pulmonary hypertension, unspecified Facility Procedures : CPT4 Code: [id number] Description: 97597 - DEBRIDE WOUND 1ST 20 SQ CM OR < ICD-10 Diagnosis Description L97.522 Non-pressure chronic ulcer of other part of left foot with fat layer exposed Modifier: Quantity: 1 Physician Procedures : CPT4 Code Description Modifier [id number] - WC PHYS LEVEL 3 - EST PT 25 ICD-10 Diagnosis Description L97.522 Non-pressure chronic ulcer of other part of left foot with fat layer exposed E11.621 Type 2 diabetes mellitus with foot ulcer I50.32 Chronic  diastolic (congestive) heart failure I27.20 Pulmonary hypertension, unspecified Quantity: 1 : [id number] 97597 - WC PHYS DEBR WO ANESTH 20 SQ CM ICD-10 Diagnosis Description L97.522 Non-pressure chronic ulcer of other part of left foot with fat layer exposed Quantity: 1 Electronic Signature(s) Signed:  10/12/2022 3:47:36 PM By: Duanne Guess MD FACS Entered By: Duanne Guess on 10/12/2022 15:47:36

## 2022-10-17 ENCOUNTER — Telehealth: Payer: Self-pay

## 2022-10-17 NOTE — Telephone Encounter (Signed)
   Name: [name]  DOB: Aug 26, 1931  MRN: [id number]   Primary Cardiologist: Lance Muss, MD  Chart reviewed as part of pre-operative protocol coverage.   Per Dr. Eldridge Dace: From reading the chart, I would not pursue ischemic testing as given the entire scenario, I do not think elective cath/PCI adds to her care.  Toe amputation if needed is a low risk surgery.  Her risks are mostly from her age and frailty.  Ischemic testing is unlikely to improve her perioperative risk.   Dr. Ladona Ridgel is in agreement.    Therefore, based on ACC/AHA guidelines, the patient would be at acceptable risk for the planned procedure without further cardiovascular testing.    I will route this recommendation to the requesting party via Epic fax function and remove from pre-op pool. Please call with questions.  Roe Rutherford Edward Trevino, PA 10/17/2022, 11:16 AM

## 2022-10-17 NOTE — Telephone Encounter (Signed)
Spoke with patient's daughter [name] regarding scheduling for angiogram since patient has cleared by cardiology. She advised I needed to contact patient because she had to make the decision. Contacted patient and she informed me that she does not want to proceed with the procedure at this time and continue to follow up with the wound care center. Her next appointment is on 10/26/22. Will update Dr. Lenell Antu of patient's decision.

## 2022-10-26 ENCOUNTER — Encounter (HOSPITAL_BASED_OUTPATIENT_CLINIC_OR_DEPARTMENT_OTHER): Payer: Medicare Other | Admitting: General Surgery

## 2022-11-09 ENCOUNTER — Encounter (HOSPITAL_BASED_OUTPATIENT_CLINIC_OR_DEPARTMENT_OTHER): Payer: Medicare Other | Attending: General Surgery | Admitting: General Surgery

## 2022-11-09 DIAGNOSIS — E11621 Type 2 diabetes mellitus with foot ulcer: Secondary | ICD-10-CM | POA: Diagnosis present

## 2022-11-09 DIAGNOSIS — L97522 Non-pressure chronic ulcer of other part of left foot with fat layer exposed: Secondary | ICD-10-CM | POA: Diagnosis not present

## 2022-11-09 DIAGNOSIS — I272 Pulmonary hypertension, unspecified: Secondary | ICD-10-CM | POA: Diagnosis not present

## 2022-11-09 DIAGNOSIS — I110 Hypertensive heart disease with heart failure: Secondary | ICD-10-CM | POA: Diagnosis not present

## 2022-11-09 DIAGNOSIS — I5032 Chronic diastolic (congestive) heart failure: Secondary | ICD-10-CM | POA: Diagnosis not present

## 2022-11-10 NOTE — Progress Notes (Signed)
SAJA, BARTOLINI ([id number]) [id number] Page 1 of 10 Visit Report for 11/09/2022 Chief Complaint Document Details Patient Name: Date of Service: [name], [name] 11/09/2022 1:15 PM Medical Record Number: [id number] Patient Account Number: [id number] Date of Birth/Sex: Treating RN: [birth year] (88 y.o. F) Primary Care Provider: SYSTEM, PRO V IDER Other Clinician: Referring Provider: Treating Provider/Extender: [name] in Treatment: 40 Information Obtained from: Patient Chief Complaint Patient presents to the wound care center with open non-healing surgical wound(s) Electronic Signature(s) Signed: 11/09/2022 2:18:10 PM By: Duanne Guess MD FACS Entered By: Duanne Guess on 11/09/2022 14:18:10 -------------------------------------------------------------------------------- Debridement Details Patient Name: Date of Service: [name]. 11/09/2022 1:15 PM Medical Record Number: [id number] Patient Account Number: [id number] Date of Birth/Sex: Treating RN: 1930-11-28 (88 y.o. [name] Primary Care Provider: SYSTEM, PRO V IDER Other Clinician: Referring Provider: Treating Provider/Extender: [name] in Treatment: 40 Debridement Performed for Assessment: Wound #1 Left T Great oe Performed By: Physician Duanne Guess, MD Debridement Type: Debridement Severity of Tissue Pre Debridement: Fat layer exposed Level of Consciousness (Pre-procedure): Awake and Alert Pre-procedure Verification/Time Out Yes - 13:30 Taken: Start Time: 13:30 Pain Control: Lidocaine 4% T opical Solution T Area Debrided (L x W): otal 0.8 (cm) x 0.9 (cm) = 0.72 (cm) Tissue and other material debrided: Non-Viable, Eschar, Slough, Subcutaneous, Slough, Other: Skin Level: Skin/Subcutaneous Tissue Debridement Description: Excisional Instrument: Curette, Forceps Bleeding: Minimum Hemostasis Achieved: Pressure End Time:  13:31 Procedural Pain: 0 Post Procedural Pain: 0 Response to Treatment: Procedure was tolerated well Level of Consciousness (Post- Awake and Alert procedure): Post Debridement Measurements of Total Wound Length: (cm) 0.8 Width: (cm) 0.9 Depth: (cm) 0.1 Volume: (cm) 7 Character of Wound/Ulcer Post Debridement: Improved Severity of Tissue Post Debridement: Fat layer exposed JOVANNI, ECKHART ([id number]) [id number] Page 2 of 10 Post Procedure Diagnosis Same as Pre-procedure Notes Scribed for Dr. Lady Gary by J.Scotton Electronic Signature(s) Signed: 11/09/2022 4:53:29 PM By: Duanne Guess MD FACS Signed: 11/09/2022 5:28:34 PM By: Karie Schwalbe RN Entered By: Karie Schwalbe on 11/09/2022 13:52:45 -------------------------------------------------------------------------------- HPI Details Patient Name: Date of Service: [name]. 11/09/2022 1:15 PM Medical Record Number: [id number] Patient Account Number: [id number] Date of Birth/Sex: Treating RN: [birth year] (88 y.o. F) Primary Care Provider: SYSTEM, PRO V IDER Other Clinician: Referring Provider: Treating Provider/Extender: [name] in Treatment: 40 History of Present Illness HPI Description: ADMISSION 02/01/2022 This is an 88 year old woman who is a resident of [place]. She has a past medical history notable for type 2 diabetes mellitus, congestive heart failure, pulmonary hypertension, and complete heart block. She is accompanied today by her son. Approximately 4 months ago, she had an ingrown toe nail removed. Apparently this went a bit deep. Since that time, it has not healed. The patient is nonambulatory. Apparently they have been applying Santyl to the site at her facility. She was on antibiotics but I am not certain which specific drugs she took. She completed her last course a few weeks ago. We were unable to obtain ABIs in clinic secondary to the patient's body  habitus. She has not had any formal vascular studies nor any x-ray of the area. 02/09/2022: The wound is roughly the same size today, but the amount of slough in the base has decreased significantly. She apparently had an x-ray done at her facility, but we do not have the report; per verbal report from facility staff, "there was nothing there." We had requested formal ABI/TBI studies, but these were not done.  She has been in [place] under [employer] [facility]	[place]. 02/16/2022: The wound measured about the same today, but to my eye, it appears rather smaller. The slough in the base is minimal. Her vascular studies are scheduled for Friday. We have been using Santyl under Hydrofera Blue. No concern for infection. 02/23/2022: Once again, the wound has been measured about the same size, but I think it looks a bit smaller. There is slough at the base. She had her ABIs done last week and these show severe bilateral peripheral arterial disease. We received the report of the foot x-ray that was ordered. There is no concern for osteomyelitis at this time. 03/02/2022: The wound is smaller today with a small amount of slough at the base. We placed a referral to vein and vascular surgery last week in response to her severe peripheral arterial disease, but she does not yet have an appointment on the books. 03/16/2022: She saw Dr. Lenell Antu in the vascular surgery department yesterday. He did not think she was a good candidate for any revascularization options and suggested that she continue local wound care for another month. If she fails to heal in that time, they may consider an angiogram with potential intervention. The wound today is roughly the same size but there is less exposed bone at the base and the surface is clean without any substantial slough. 03/24/2022: The wound appears about the same. There is some periwound callus that has heaped up. We changed her to Prisma silver collagen last week. 04/06/2022: No significant change  to the wound. It is neither improving nor deteriorating. She continues to accumulate some periwound callus around the margin. Bone is exposed at the base. 04/20/2022: The wound has improved quite a bit over the past 2 weeks. It is smaller and there is no real accumulation of periwound callus. She had follow-up with Dr. Lenell Antu yesterday and he felt that the wound was progressing well enough that no arterial intervention was necessary. 05/18/2022: The patient has been absent due to a fall from her wheelchair during her last transportation that was supposed to be to her clinic. Today, the wound is about the same size. There is still bone exposed at the base. There is periwound callus accumulation. No concern for infection. 06/01/2022: The wound is actually a little bit smaller today and the surface is not as pale. She still has accumulated periwound callus. 06/15/2022: The wound continues to contract, albeit slowly. No concern for infection. 06/28/2022: The wound is slightly smaller again today. She continues to accumulate callus and there is some periwound epibole at the margins. No concern for gross infection. 07/11/2022: The wound is roughly the same size with just a small opening in the center. She has been approved for Grafix. 07/18/2022: The wound has contracted somewhat and the surface has a healthier pink look to it. We applied Grafix last week and she is here for application #2. 07/28/2022: The wound is about the same size but the surface continues to improve and appear more vital. She is here for Grafix application #3. 08/04/2022: There is still a stubborn central portion of her toe wound that is not closing in yet. The bone remains exposed. She is here for Grafix application #4. ARANTXA, PIERCEY ([id number]) [id number] Page 3 of 10 08/11/2022: Today when I debrided the eschar and callus from her wound, purulent drainage was expressed from her wound. The underlying surface  is unchanged with bone exposed. She has been having more pain in the toe and this  may explain why. 08/18/2022: The culture that I took last week grew out Candida and Acinetobacter. We changed her dressing regimen to terbinafine instead of gentamicin and I prescribed levofloxacin instead of Augmentin. T oday, the wound does not look that much different. It remains small with granulation tissue at the base, but bone remains exposed. 09/01/2022: The wound is about the same. I had debrided a fair amount of periwound callus and nonviable skin last week, so it does measure a little bit larger. The bone is still exposed. No obvious signs of infection. I requested a plain x-ray of the patient's foot a month ago and still have not received the results of that study, performed at her nursing facility. She continues to complain of pain in her toe and I am concerned that she has osteomyelitis. Based upon her previous ABIs, she does not have adequate blood flow to heal an amputation and she has failed to make much progress in terms of her wound healing. 09/15/2022: I did receive the results of the x-ray, but no actual images. No osteomyelitis was identified on the film. We referred her back to vascular surgery, but she does not yet have an appointment. The wound is unchanged. 09/29/2022: She was seen by Dr. Lenell Antu on November 7. He has offered her an angiogram with intervention if feasible. She is not sure if she is comfortable with going forward. Her wound is completely unchanged. 10/12/2022: The wound is unchanged. It sounds like there has been a fair amount of discussion between the patient, her daughter, Dr. Lenell Antu, and cardiology. It honestly sounds like the patient is not interested in any sort of vascular intervention. She is concerned about the possibility of stent placement, as well as the need for dye and its potential effect on her kidneys. I told the patient and her daughter, who was present via speaker  phone, without better blood flow, I did not think this wound had a chance of healing. 11/09/2022: 1 month follow-up. Ultimately, the patient elected not to undergo any arteriography and intervention. T oday, she presents with the tip of her toe necrotic. There is no purulent drainage or malodor, but the tip of her toe is black. The wound is larger and there is dead skin peeling away from the wound opening. Electronic Signature(s) Signed: 11/09/2022 2:19:45 PM By: Duanne Guess MD FACS Entered By: Duanne Guess on 11/09/2022 14:19:45 -------------------------------------------------------------------------------- Physical Exam Details Patient Name: Date of Service: [name] 11/09/2022 1:15 PM Medical Record Number: [id number] Patient Account Number: [id number] Date of Birth/Sex: Treating RN: 1931/07/21 (88 y.o. F) Primary Care Provider: SYSTEM, PRO V IDER Other Clinician: Referring Provider: Treating Provider/Extender: [name] in Treatment: 40 Constitutional She is hypertensive, but asymptomatic.. . . . No acute distress. Respiratory Normal work of breathing on room air. Notes 11/09/2022: There is no purulent drainage or malodor, but the tip of her toe is black. The wound is larger and there is dead skin peeling away from the wound opening. Electronic Signature(s) Signed: 11/09/2022 2:20:30 PM By: Duanne Guess MD FACS Entered By: Duanne Guess on 11/09/2022 14:20:30 -------------------------------------------------------------------------------- Physician Orders Details Patient Name: Date of Service: [name], LYNLEIGH KOVACK 11/09/2022 1:15 PM Medical Record Number: [id number] Patient Account Number: [id number] Date of Birth/Sex: Treating RN: [birth year] (88 y.o. [name] Primary Care Provider: SYSTEM, PRO Rodena Goldmann Other Clinician: Referring Provider: Treating Provider/Extender: [name] in Treatment: [address] [place] S  ([id number]) [id number] Page 4 of 10 Verbal / Phone Orders:  Yes Clinician: Karie Schwalbe Read Back and Verified: No Diagnosis Coding ICD-10 Coding Code Description E11.621 Type 2 diabetes mellitus with foot ulcer L97.522 Non-pressure chronic ulcer of other part of left foot with fat layer exposed I50.32 Chronic diastolic (congestive) heart failure I27.20 Pulmonary hypertension, unspecified Follow-up Appointments ppointment in 2 weeks. - Dr Lady Gary Room 3 Return A Anesthetic (In clinic) Topical Lidocaine 4% applied to wound bed - In Clinic Off-Loading Other: - No direct pressure on toes Wound Treatment Wound #1 - T Great oe Wound Laterality: Left Cleanser: Wound Cleanser 2 x Per Day/30 Days Discharge Instructions: Cleanse the wound with wound cleanser prior to applying a clean dressing using gauze sponges, not tissue or cotton balls. Topical: Betadine 2 x Per Day/30 Days Discharge Instructions: Paint Left Gr.T 2x per day. Then when dry, place gauze and tape oe Secondary Dressing: Woven Gauze Sponges 2x2 in 2 x Per Day/30 Days Discharge Instructions: Apply over primary dressing as directed. Secured With: Paper Tape, 1x10 (in/yd) 2 x Per Day/30 Days Discharge Instructions: Secure dressing with tape as directed. Patient Medications llergies: aspirin, tramadol, naproxen, shellfish derived A Notifications Medication Indication Start End 11/09/2022 amoxicillin-pot clavulanate DOSE oral 875 mg-125 mg tablet - 1 tab p.o. twice daily x 10 days Electronic Signature(s) Signed: 11/09/2022 4:53:29 PM By: Duanne Guess MD FACS Previous Signature: 11/09/2022 2:21:23 PM Version By: Duanne Guess MD FACS Entered By: Duanne Guess on 11/09/2022 14:21:43 Prescription 11/09/2022 -------------------------------------------------------------------------------- [name]. Duanne Guess MD Patient Name: Provider: 1931-06-18 [id number] Date of  Birth: NPI#: [id number] Sex: DEA #: [phone] Phone #: License #: Eligha Bridegroom [facility] Patient Address: [facility] [address] [facility] [address] [place], [place] [address] [place], [place] [address] [phone] Allergies aspirin; tramadol; naproxen; shellfish derived LIBRADA, CASTRONOVO ([id number]) [phone].pdf Page 5 of 10 Medication Medication: Route: Strength: Form: amoxicillin 875 mg-potassium clavulanate 125 oral 875 mg-125 mg tablet mg tablet Class: PENICILLIN ANTIBIOTICS Dose: Frequency / Time: Indication: 1 tab p.o. twice daily x 10 days Number of Refills: Number of Units: 0 Twenty (20) Generic Substitution: Start Date: End Date: One Time Use: Substitution Permitted 11/09/2022 No Note to Pharmacy: Hand Signature: Date(s): Electronic Signature(s) Signed: 11/09/2022 2:25:44 PM By: Duanne Guess MD FACS Previous Signature: 11/09/2022 2:25:36 PM Version By: Duanne Guess MD FACS Entered By: Duanne Guess on 11/09/2022 14:25:44 -------------------------------------------------------------------------------- Problem List Details Patient Name: Date of Service: [name]. 11/09/2022 1:15 PM Medical Record Number: [id number] Patient Account Number: [id number] Date of Birth/Sex: Treating RN: 01-16-1931 (88 y.o. F) Primary Care Provider: SYSTEM, PRO V IDER Other Clinician: Referring Provider: Treating Provider/Extender: [name] in Treatment: 40 Active Problems ICD-10 Encounter Code Description Active Date MDM Diagnosis E11.621 Type 2 diabetes mellitus with foot ulcer 02/01/2022 No Yes L97.522 Non-pressure chronic ulcer of other part of left foot with fat layer exposed 02/01/2022 No Yes I50.32 Chronic diastolic (congestive) heart failure 02/01/2022 No Yes I27.20 Pulmonary hypertension, unspecified 02/01/2022 No Yes Inactive  Problems Resolved Problems Electronic Signature(s) Signed: 11/09/2022 2:17:59 PM By: Duanne Guess MD FACS Entered By: Duanne Guess on 11/09/2022 14:17:58 [name] ([id number]) [id number] Page 6 of 10 -------------------------------------------------------------------------------- Progress Note Details Patient Name: Date of Service: [name], [name] 11/09/2022 1:15 PM Medical Record Number: [id number] Patient Account Number: [id number] Date of Birth/Sex: Treating RN: 19-Oct-1931 (88 y.o. F) Primary Care Provider: SYSTEM, PRO V IDER Other Clinician: Referring Provider: Treating Provider/Extender: Duanne Guess  Weeks in Treatment: 40 Subjective Chief Complaint Information obtained from Patient Patient presents to the wound care center with open non-healing surgical wound(s) History of Present Illness (HPI) ADMISSION 02/01/2022 This is an 88 year old woman who is a resident of [place]. She has a past medical history notable for type 2 diabetes mellitus, congestive heart failure, pulmonary hypertension, and complete heart block. She is accompanied today by her son. Approximately 4 months ago, she had an ingrown toe nail removed. Apparently this went a bit deep. Since that time, it has not healed. The patient is nonambulatory. Apparently they have been applying Santyl to the site at her facility. She was on antibiotics but I am not certain which specific drugs she took. She completed her last course a few weeks ago. We were unable to obtain ABIs in clinic secondary to the patient's body habitus. She has not had any formal vascular studies nor any x-ray of the area. 02/09/2022: The wound is roughly the same size today, but the amount of slough in the base has decreased significantly. She apparently had an x-ray done at her facility, but we do not have the report; per verbal report from facility staff, "there was nothing there." We had requested  formal ABI/TBI studies, but these were not done. She has been in [place] under [employer] [facility]	[place]. 02/16/2022: The wound measured about the same today, but to my eye, it appears rather smaller. The slough in the base is minimal. Her vascular studies are scheduled for Friday. We have been using Santyl under Hydrofera Blue. No concern for infection. 02/23/2022: Once again, the wound has been measured about the same size, but I think it looks a bit smaller. There is slough at the base. She had her ABIs done last week and these show severe bilateral peripheral arterial disease. We received the report of the foot x-ray that was ordered. There is no concern for osteomyelitis at this time. 03/02/2022: The wound is smaller today with a small amount of slough at the base. We placed a referral to vein and vascular surgery last week in response to her severe peripheral arterial disease, but she does not yet have an appointment on the books. 03/16/2022: She saw Dr. Lenell Antu in the vascular surgery department yesterday. He did not think she was a good candidate for any revascularization options and suggested that she continue local wound care for another month. If she fails to heal in that time, they may consider an angiogram with potential intervention. The wound today is roughly the same size but there is less exposed bone at the base and the surface is clean without any substantial slough. 03/24/2022: The wound appears about the same. There is some periwound callus that has heaped up. We changed her to Prisma silver collagen last week. 04/06/2022: No significant change to the wound. It is neither improving nor deteriorating. She continues to accumulate some periwound callus around the margin. Bone is exposed at the base. 04/20/2022: The wound has improved quite a bit over the past 2 weeks. It is smaller and there is no real accumulation of periwound callus. She had follow-up with Dr. Lenell Antu yesterday and he felt that the  wound was progressing well enough that no arterial intervention was necessary. 05/18/2022: The patient has been absent due to a fall from her wheelchair during her last transportation that was supposed to be to her clinic. Today, the wound is about the same size. There is still bone exposed at the base. There is periwound callus  accumulation. No concern for infection. 06/01/2022: The wound is actually a little bit smaller today and the surface is not as pale. She still has accumulated periwound callus. 06/15/2022: The wound continues to contract, albeit slowly. No concern for infection. 06/28/2022: The wound is slightly smaller again today. She continues to accumulate callus and there is some periwound epibole at the margins. No concern for gross infection. 07/11/2022: The wound is roughly the same size with just a small opening in the center. She has been approved for Grafix. 07/18/2022: The wound has contracted somewhat and the surface has a healthier pink look to it. We applied Grafix last week and she is here for application #2. 07/28/2022: The wound is about the same size but the surface continues to improve and appear more vital. She is here for Grafix application #3. 08/04/2022: There is still a stubborn central portion of her toe wound that is not closing in yet. The bone remains exposed. She is here for Grafix application #4. 08/11/2022: Today when I debrided the eschar and callus from her wound, purulent drainage was expressed from her wound. The underlying surface is unchanged with bone exposed. She has been having more pain in the toe and this may explain why. 08/18/2022: The culture that I took last week grew out Candida and Acinetobacter. We changed her dressing regimen to terbinafine instead of gentamicin and I prescribed levofloxacin instead of Augmentin. T oday, the wound does not look that much different. It remains small with granulation tissue at the base, but bone remains  exposed. 09/01/2022: The wound is about the same. I had debrided a fair amount of periwound callus and nonviable skin last week, so it does measure a little bit larger. The bone is still exposed. No obvious signs of infection. I requested a plain x-ray of the patient's foot a month ago and still have not received the results of that study, performed at her nursing facility. She continues to complain of pain in her toe and I am concerned that she has osteomyelitis. Based upon her previous ABIs, she does not have adequate blood flow to heal an amputation and she has failed to make much progress in terms of her wound healing. EMMERSEN, GARRAWAY ([id number]) [id number] Page 7 of 10 09/15/2022: I did receive the results of the x-ray, but no actual images. No osteomyelitis was identified on the film. We referred her back to vascular surgery, but she does not yet have an appointment. The wound is unchanged. 09/29/2022: She was seen by Dr. Lenell Antu on November 7. He has offered her an angiogram with intervention if feasible. She is not sure if she is comfortable with going forward. Her wound is completely unchanged. 10/12/2022: The wound is unchanged. It sounds like there has been a fair amount of discussion between the patient, her daughter, Dr. Lenell Antu, and cardiology. It honestly sounds like the patient is not interested in any sort of vascular intervention. She is concerned about the possibility of stent placement, as well as the need for dye and its potential effect on her kidneys. I told the patient and her daughter, who was present via speaker phone, without better blood flow, I did not think this wound had a chance of healing. 11/09/2022: 1 month follow-up. Ultimately, the patient elected not to undergo any arteriography and intervention. T oday, she presents with the tip of her toe necrotic. There is no purulent drainage or malodor, but the tip of her toe is black. The wound is  larger  and there is dead skin peeling away from the wound opening. Patient History Social History Never smoker, Marital Status - Widowed, Alcohol Use - Never, Drug Use - No History, Caffeine Use - Rarely. Medical History Cardiovascular Patient has history of Arrhythmia - AV block, complete heart block, Congestive Heart Failure, Hypertension Endocrine Patient has history of Type II Diabetes Musculoskeletal Patient has history of Gout, Osteoarthritis Medical A Surgical History Notes nd Cardiovascular Pulmonary Hypertension Endocrine Hypoglycemia Neurologic Acute encephalopathy Objective Constitutional She is hypertensive, but asymptomatic.[name] No acute distress. Vitals Time Taken: 1:21 PM, Temperature: 98.1 F, Pulse: 84 bpm, Respiratory Rate: 18 breaths/min, Blood Pressure: 152/75 mmHg. Respiratory Normal work of breathing on room air. General Notes: 11/09/2022: There is no purulent drainage or malodor, but the tip of her toe is black. The wound is larger and there is dead skin peeling away from the wound opening. Integumentary (Hair, Skin) Wound #1 status is Open. Original cause of wound was Gradually Appeared. The date acquired was: 09/29/2021. The wound has been in treatment 40 weeks. The wound is located on the Left T Great. The wound measures 0.8cm length x 0.9cm width x 0.1cm depth; 0.565cm^2 area and 0.057cm^3 volume. There is Fat oe Layer (Subcutaneous Tissue) exposed. There is no tunneling or undermining noted. There is a small amount of serous drainage noted. The wound margin is flat and intact. There is small (1-33%) red granulation within the wound bed. There is a large (67-100%) amount of necrotic tissue within the wound bed including Eschar and Adherent Slough. The periwound skin appearance had no abnormalities noted for moisture. The periwound skin appearance had no abnormalities noted for color. The periwound skin appearance exhibited: Scarring. The periwound skin  appearance did not exhibit: Induration. Periwound temperature was noted as No Abnormality. Assessment Active Problems ICD-10 Type 2 diabetes mellitus with foot ulcer Non-pressure chronic ulcer of other part of left foot with fat layer exposed Chronic diastolic (congestive) heart failure Pulmonary hypertension, unspecified Procedures MELISS, FLEEK ([id number]) [id number] Page 8 of 10 Wound #1 Pre-procedure diagnosis of Wound #1 is a Diabetic Wound/Ulcer of the Lower Extremity located on the Left T Great .Severity of Tissue Pre Debridement is: oe Fat layer exposed. There was a Excisional Skin/Subcutaneous Tissue Debridement with a total area of 0.72 sq cm performed by Duanne Guess, MD. With the following instrument(s): Curette, and Forceps to remove Non-Viable tissue/material. Material removed includes Eschar, Subcutaneous Tissue, Slough, and Other: Skin after achieving pain control using Lidocaine 4% Topical Solution. No specimens were taken. A time out was conducted at 13:30, prior to the start of the procedure. A Minimum amount of bleeding was controlled with Pressure. The procedure was tolerated well with a pain level of 0 throughout and a pain level of 0 following the procedure. Post Debridement Measurements: 0.8cm length x 0.9cm width x 0.1cm depth; 7cm^3 volume. Character of Wound/Ulcer Post Debridement is improved. Severity of Tissue Post Debridement is: Fat layer exposed. Post procedure Diagnosis Wound #1: Same as Pre-Procedure General Notes: Scribed for Dr. Lady Gary by J.Scotton. Plan Follow-up Appointments: Return Appointment in 2 weeks. - Dr Lady Gary Room 3 Anesthetic: (In clinic) Topical Lidocaine 4% applied to wound bed - In Clinic Off-Loading: Other: - No direct pressure on toes The following medication(s) was prescribed: amoxicillin-pot clavulanate oral 875 mg-125 mg tablet 1 tab p.o. twice daily x 10 days starting 11/09/2022 WOUND #1: - T  Great Wound Laterality: Left oe Cleanser: Wound Cleanser 2 x Per Day/30 Days Discharge Instructions: Cleanse the wound  with wound cleanser prior to applying a clean dressing using gauze sponges, not tissue or cotton balls. Topical: Betadine 2 x Per Day/30 Days Discharge Instructions: Paint Left Gr.T 2x per day. Then when dry, place gauze and tape oe Secondary Dressing: Woven Gauze Sponges 2x2 in 2 x Per Day/30 Days Discharge Instructions: Apply over primary dressing as directed. Secured With: Paper T ape, 1x10 (in/yd) 2 x Per Day/30 Days Discharge Instructions: Secure dressing with tape as directed. 11/09/2022: There is no purulent drainage or malodor, but the tip of her toe is black. The wound is larger and there is dead skin peeling away from the wound opening. I used a combination of curette, forceps, and scissors to debride eschar, slough, nonviable subcutaneous tissue, and skin from the wound. I doubt that this toe is salvageable, given the known diminished arterial flow and the patient's decision to forego angiography and any potential intervention. I have empirically prescribed a 10-day course of Augmentin. We have ordered twice daily dressing changes, painting the toe with Betadine and covering it with a dry gauze. I think it is likely she will ultimately require amputation, but perhaps we can avoid her becoming septic from the toe in the interim. She will follow-up in 2 weeks. Electronic Signature(s) Signed: 11/09/2022 2:23:23 PM By: Duanne Guess MD FACS Entered By: Duanne Guess on 11/09/2022 14:23:23 -------------------------------------------------------------------------------- HxROS Details Patient Name: Date of Service: [name], MIRACLE MONGILLO 11/09/2022 1:15 PM Medical Record Number: [id number] Patient Account Number: [id number] Date of Birth/Sex: Treating RN: 1931/08/19 (88 y.o. F) Primary Care Provider: SYSTEM, PRO V IDER Other Clinician: Referring Provider: Treating  Provider/Extender: [name] in Treatment: 40 Cardiovascular Medical History: Positive for: Arrhythmia - AV block, complete heart block; Congestive Heart Failure; Hypertension Past Medical History Notes: Pulmonary Hypertension Endocrine Medical History: Positive for: Type II Diabetes Past Medical History Notes: Hypoglycemia ANALLELY, ROSELL ([id number]) [id number] Page 9 of 10 Treated with: Oral agents Blood sugar tested every day: Yes Tested : 2-3x a day Musculoskeletal Medical History: Positive for: Gout; Osteoarthritis Neurologic Medical History: Past Medical History Notes: Acute encephalopathy Immunizations Pneumococcal Vaccine: Received Pneumococcal Vaccination: No Implantable Devices No devices added Family and Social History Never smoker; Marital Status - Widowed; Alcohol Use: Never; Drug Use: No History; Caffeine Use: Rarely; Financial Concerns: No; Food, Clothing or Shelter Needs: No; Support System Lacking: No; Transportation Concerns: No Electronic Signature(s) Signed: 11/09/2022 4:53:29 PM By: Duanne Guess MD FACS Entered By: Duanne Guess on 11/09/2022 14:19:51 -------------------------------------------------------------------------------- SuperBill Details Patient Name: Date of Service: [name] 11/09/2022 Medical Record Number: [id number] Patient Account Number: [id number] Date of Birth/Sex: Treating RN: 27-Aug-1931 (88 y.o. F) Primary Care Provider: SYSTEM, PRO V IDER Other Clinician: Referring Provider: Treating Provider/Extender: [name] in Treatment: 40 Diagnosis Coding ICD-10 Codes Code Description E11.621 Type 2 diabetes mellitus with foot ulcer L97.522 Non-pressure chronic ulcer of other part of left foot with fat layer exposed I50.32 Chronic diastolic (congestive) heart failure I27.20 Pulmonary hypertension, unspecified Facility Procedures : CPT4 Code:  [id number] Description: 11042 - DEB SUBQ TISSUE 20 SQ CM/< ICD-10 Diagnosis Description L97.522 Non-pressure chronic ulcer of other part of left foot with fat layer exposed Modifier: Quantity: 1 Physician Procedures : CPT4 Code Description Modifier [id number] - WC PHYS LEVEL 4 - EST PT 25 ICD-10 Diagnosis Description L97.522 Non-pressure chronic ulcer of other part of left foot with fat layer exposed E11.621 Type 2 diabetes mellitus with foot ulcer I50.32 Chronic  diastolic (congestive) heart failure I27.20 Pulmonary  hypertension, unspecified KATTIE, SANTOYO ([id number]) [id number] Quantity: 1 .pdf Page 10 of 10 : [id number] 11042 - WC PHYS SUBQ TISS 20 SQ CM 1 ICD-10 Diagnosis Description L97.522 Non-pressure chronic ulcer of other part of left foot with fat layer exposed Quantity: Electronic Signature(s) Signed: 11/09/2022 2:23:41 PM By: Duanne Guess MD FACS Entered By: Duanne Guess on 11/09/2022 14:23:40

## 2022-11-10 NOTE — Progress Notes (Signed)
DAIONNA, CROSSLAND ([id number]) [id number] Page 1 of 5 Visit Report for 11/09/2022 Arrival Information Details Patient Name: Date of Service: MICOLE, DELEHANTY 11/09/2022 1:15 PM Medical Record Number: [id number] Patient Account Number: [id number] Date of Birth/Sex: Treating RN: 1931-06-04 (88 y.o. [name] Primary Care Anael Rosch: SYSTEM, PRO V IDER Other Clinician: Referring Yee Joss: Treating Sparkles Mcneely/Extender: [name] in Treatment: 40 Visit Information History Since Last Visit Added or deleted any medications: No Patient Arrived: Wheel Chair Any new allergies or adverse reactions: No Arrival Time: 13:21 Had a fall or experienced change in No Accompanied By: son activities of daily living that may affect Transfer Assistance: None risk of falls: Patient Identification Verified: Yes Signs or symptoms of abuse/neglect since last visito No Patient Requires Transmission-Based Precautions: No Hospitalized since last visit: No Patient Has Alerts: Yes Implantable device outside of the clinic excluding No Patient Alerts: ABI not obtainable cellular tissue based products placed in the center As per [facility] L ABI 0.49 since last visit: As per V[facility] R ABI 0.74 Has Dressing in Place as Prescribed: Yes Pain Present Now: Yes Electronic Signature(s) Signed: 11/09/2022 5:28:34 PM By: Karie Schwalbe RN Entered By: Karie Schwalbe on 11/09/2022 13:22:13 -------------------------------------------------------------------------------- Lower Extremity Assessment Details Patient Name: Date of Service: SHADARA, LOPEZ 11/09/2022 1:15 PM Medical Record Number: [id number] Patient Account Number: [id number] Date of Birth/Sex: Treating RN: Aug 14, 1931 (88 y.o. [name] Primary Care Deborah Dondero: SYSTEM, PRO Rodena Goldmann Other Clinician: Referring Wyland Rastetter: Treating Donnis Pecha/Extender: [name] in Treatment: 40 Edema Assessment Assessed:  [Left: No] [Right: No] [Left: Edema] [Right: :] Calf Left: Right: Point of Measurement: 26 cm From Medial Instep Ankle Left: Right: Point of Measurement: 10 cm From Medial Instep Vascular Assessment Pulses: Dorsalis Pedis Palpable: [Left:Yes] [Right:[id number] Page 2 of 5] Electronic Signature(s) Signed: 11/09/2022 5:28:34 PM By: Karie Schwalbe RN Entered By: Karie Schwalbe on 11/09/2022 13:23:37 -------------------------------------------------------------------------------- Multi Wound Chart Details Patient Name: Date of Service: [name], KAMIRAH SHUGRUE 11/09/2022 1:15 PM Medical Record Number: [id number] Patient Account Number: [id number] Date of Birth/Sex: Treating RN: [birth year] (88 y.o. F) Primary Care Dahlia Nifong: SYSTEM, PRO V IDER Other Clinician: Referring Krue Peterka: Treating Reinette Cuneo/Extender: [name] in Treatment: 40 Vital Signs Height(in): Pulse(bpm): 84 Weight(lbs): Blood Pressure(mmHg): 152/75 Body Mass Index(BMI): Temperature(F): 98.1 Respiratory Rate(breaths/min): 18 [1:Photos:] [N/A:N/A] Left T Great oe N/A N/A Wound Location: Gradually Appeared N/A N/A Wounding Event: Diabetic Wound/Ulcer of the Lower N/A N/A Primary Etiology: Extremity Arrhythmia, Congestive Heart Failure, N/A N/A Comorbid History: Hypertension, Type II Diabetes, Gout, Osteoarthritis 09/29/2021 N/A N/A Date Acquired: 40 N/A N/A Weeks of Treatment: Open N/A N/A Wound Status: No N/A N/A Wound Recurrence: 0.8x0.9x0.1 N/A N/A Measurements L x W x D (cm) 0.565 N/A N/A A (cm) : rea 0.057 N/A N/A Volume (cm) : -99.60% N/A N/A % Reduction in A rea: 0.00% N/A N/A % Reduction in Volume: Grade 2 N/A N/A Classification: Small N/A N/A Exudate A mount: Serous N/A N/A Exudate Type: amber N/A N/A Exudate Color: Flat and Intact N/A N/A Wound Margin: Small (1-33%) N/A N/A Granulation A mount: Red N/A N/A Granulation Quality: Large  (67-100%) N/A N/A Necrotic A mount: Eschar, Adherent Slough N/A N/A Necrotic Tissue: Fat Layer (Subcutaneous Tissue): Yes N/A N/A Exposed Structures: Fascia: No Tendon: No Muscle: No Joint: No Bone: No Small (1-33%) N/A N/A Epithelialization: Debridement - Excisional N/A N/A Debridement: Pre-procedure Verification/Time Out 13:30 N/A N/A Taken: Lidocaine 4% Topical Solution N/A N/A Pain Control: Necrotic/Eschar, Other, Subcutaneous, N/A N/A Tissue  Debrided: Slough Skin/Subcutaneous Tissue N/A N/A Level: 0.72 N/A N/A Debridement A (sq cm): rea VALLERY, MCDADE ([id number]) [phone].pdf Page 3 of 5 Curette, Forceps N/A N/A Instrument: Minimum N/A N/A Bleeding: Pressure N/A N/A Hemostasis Achieved: 0 N/A N/A Procedural Pain: 0 N/A N/A Post Procedural Pain: Procedure was tolerated well N/A N/A Debridement Treatment Response: 0.8x0.9x0.1 N/A N/A Post Debridement Measurements L x W x D (cm) 7 N/A N/A Post Debridement Volume: (cm) Scarring: Yes N/A N/A Periwound Skin Texture: Induration: No No Abnormalities Noted N/A N/A Periwound Skin Moisture: No Abnormalities Noted N/A N/A Periwound Skin Color: No Abnormality N/A N/A Temperature: Debridement N/A N/A Procedures Performed: Treatment Notes Electronic Signature(s) Signed: 11/09/2022 2:18:05 PM By: Duanne Guess MD FACS Entered By: Duanne Guess on 11/09/2022 14:18:05 -------------------------------------------------------------------------------- Pain Assessment Details Patient Name: Date of Service: [name]. 11/09/2022 1:15 PM Medical Record Number: [id number] Patient Account Number: [id number] Date of Birth/Sex: Treating RN: 08-28-1931 (88 y.o. [name] Primary Care Chalise Pe: SYSTEM, PRO Rodena Goldmann Other Clinician: Referring Deante Blough: Treating Skylarr Liz/Extender: [name] in Treatment: 40 Active Problems Location of Pain Severity and  Description of Pain Patient Has Paino Yes Site Locations Pain Location: Generalized Pain With Dressing Change: No Duration of the Pain. Constant / Intermittento Constant Rate the pain. Current Pain Level: 3 Worst Pain Level: 10 Least Pain Level: 3 Tolerable Pain Level: 4 Pain Management and Medication Current Pain Management: Medication: Yes Cold Application: No Rest: Yes Massage: No Activity: No T.E.N.S.: No Heat Application: No Leg drop or elevation: No Is the Current Pain Management Adequate: Adequate How does your wound impact your activities of daily livingo Sleep: No Bathing: No Appetite: No Relationship With Others: No Bladder Continence: No Emotions: No Bowel Continence: No Work: No EMONII, WIENKE ([id number]) [id number] Page 4 of 5 Toileting: No Drive: No Dressing: No Hobbies: No Electronic Signature(s) Signed: 11/09/2022 5:28:34 PM By: Karie Schwalbe RN Entered By: Karie Schwalbe on 11/09/2022 13:23:29 -------------------------------------------------------------------------------- Wound Assessment Details Patient Name: Date of Service: [name] 11/09/2022 1:15 PM Medical Record Number: [id number] Patient Account Number: [id number] Date of Birth/Sex: Treating RN: [birth year] (88 y.o. [name] Primary Care Donte Kary: SYSTEM, PRO V IDER Other Clinician: Referring Mariell Nester: Treating Sriansh Farra/Extender: [name] in Treatment: 40 Wound Status Wound Number: 1 Primary Diabetic Wound/Ulcer of the Lower Extremity Etiology: Wound Location: Left T Great oe Wound Open Wounding Event: Gradually Appeared Status: Date Acquired: 09/29/2021 Comorbid Arrhythmia, Congestive Heart Failure, Hypertension, Type II Weeks Of Treatment: 40 History: Diabetes, Gout, Osteoarthritis Clustered Wound: No Photos Wound Measurements Length: (cm) 0.8 Width: (cm) 0.9 Depth: (cm) 0.1 Area: (cm) 0.565 Volume: (cm)  0.057 % Reduction in Area: -99.6% % Reduction in Volume: 0% Epithelialization: Small (1-33%) Tunneling: No Undermining: No Wound Description Classification: Grade 2 Wound Margin: Flat and Intact Exudate Amount: Small Exudate Type: Serous Exudate Color: amber Foul Odor After Cleansing: No Slough/Fibrino Yes Wound Bed Granulation Amount: Small (1-33%) Exposed Structure Granulation Quality: Red Fascia Exposed: No Necrotic Amount: Large (67-100%) Fat Layer (Subcutaneous Tissue) Exposed: Yes Necrotic Quality: Eschar, Adherent Slough Tendon Exposed: No Muscle Exposed: No Joint Exposed: No Bone Exposed: No Periwound Skin Texture Texture Color No Abnormalities Noted: No No Abnormalities Noted: Yes Induration: No Temperature / Pain TEMPRANCE, WYRE ([id number]) [id number] Page 5 of 5 Scarring: Yes Temperature: No Abnormality Moisture No Abnormalities Noted: Yes Electronic Signature(s) Signed: 11/09/2022 5:28:34 PM By: Karie Schwalbe RN Entered By: Karie Schwalbe on 11/09/2022 13:28:20 -------------------------------------------------------------------------------- Vitals Details Patient  Name: Date of Service: EMMORY, SOLIVAN 11/09/2022 1:15 PM Medical Record Number: [id number] Patient Account Number: [id number] Date of Birth/Sex: Treating RN: [birth year] (88 y.o. [name] Primary Care Azayla Polo: SYSTEM, PRO V IDER Other Clinician: Referring Levenia Skalicky: Treating Libbie Bartley/Extender: [name] in Treatment: 40 Vital Signs Time Taken: 13:21 Temperature (F): 98.1 Pulse (bpm): 84 Respiratory Rate (breaths/min): 18 Blood Pressure (mmHg): 152/75 Reference Range: 80 - 120 mg / dl Electronic Signature(s) Signed: 11/09/2022 5:28:34 PM By: Karie Schwalbe RN Entered By: Karie Schwalbe on 11/09/2022 13:22:39

## 2022-11-22 NOTE — Progress Notes (Signed)
Remote pacemaker transmission.   

## 2022-11-24 ENCOUNTER — Encounter (HOSPITAL_BASED_OUTPATIENT_CLINIC_OR_DEPARTMENT_OTHER): Payer: Medicare Other | Attending: General Surgery | Admitting: General Surgery

## 2022-11-24 DIAGNOSIS — I5032 Chronic diastolic (congestive) heart failure: Secondary | ICD-10-CM | POA: Insufficient documentation

## 2022-11-24 DIAGNOSIS — L97522 Non-pressure chronic ulcer of other part of left foot with fat layer exposed: Secondary | ICD-10-CM | POA: Insufficient documentation

## 2022-11-24 DIAGNOSIS — E11621 Type 2 diabetes mellitus with foot ulcer: Secondary | ICD-10-CM | POA: Insufficient documentation

## 2022-11-24 DIAGNOSIS — I96 Gangrene, not elsewhere classified: Secondary | ICD-10-CM | POA: Diagnosis not present

## 2022-11-24 DIAGNOSIS — I110 Hypertensive heart disease with heart failure: Secondary | ICD-10-CM | POA: Insufficient documentation

## 2022-11-24 DIAGNOSIS — I272 Pulmonary hypertension, unspecified: Secondary | ICD-10-CM | POA: Insufficient documentation

## 2022-11-24 DIAGNOSIS — E1152 Type 2 diabetes mellitus with diabetic peripheral angiopathy with gangrene: Secondary | ICD-10-CM | POA: Diagnosis not present

## 2022-11-24 NOTE — Progress Notes (Addendum)
[name], [name] ([id number]) [id number] Page 1 of 8 Visit Report for 11/24/2022 Chief Complaint Document Details Patient Name: Date of Service: [name], [name] 11/24/2022 1:15 PM Medical Record Number: [id number] Patient Account Number: [id number] Date of Birth/Sex: Treating RN: [name] (89 y.o. F) Primary Care Provider: SYSTEM, PRO V [name] Other Clinician: Referring Provider: Treating Provider/Extender: [name] in Treatment: 42 Information Obtained from: Patient Chief Complaint Patient presents to the wound care center with open non-healing surgical wound(s) Electronic Signature(s) Signed: 11/24/2022 2:52:20 PM By: [name] Maudlin MD FACS Entered By: [name] on 11/24/2022 14:52:19 -------------------------------------------------------------------------------- HPI Details Patient Name: Date of Service: [name], [name]. 11/24/2022 1:15 PM Medical Record Number: [id number] Patient Account Number: [id number] Date of Birth/Sex: Treating RN: [birth year] [name] (89 y.o. F) Primary Care Provider: SYSTEM, PRO V [name] Other Clinician: Referring Provider: Treating Provider/Extender: [name] in Treatment: 42 History of Present Illness HPI Description: ADMISSION 02/01/2022 This is an 89 year old woman who is a resident of [place]. She has a past medical history notable for type 2 diabetes mellitus, congestive heart failure, pulmonary hypertension, and complete heart block. She is accompanied today by her son. Approximately 4 months ago, she had an ingrown toe nail removed. Apparently this went a bit deep. Since that time, it has not healed. The patient is nonambulatory. Apparently they have been applying Santyl to the site at her facility. She was on antibiotics but I am not certain which specific drugs she took. She completed her last course a few weeks ago. We were unable to obtain ABIs in clinic secondary to the patient's body  habitus. She has not had any formal vascular studies nor any x-ray of the area. 02/09/2022: The wound is roughly the same size today, but the amount of slough in the base has decreased significantly. She apparently had an x-ray done at her facility, but we do not have the report; per verbal report from facility staff, "there was nothing there." We had requested formal ABI/TBI studies, but these were not done. She has been in [place] under [employer]. 02/16/2022: The wound measured about the same today, but to my eye, it appears rather smaller. The slough in the base is minimal. Her vascular studies are scheduled for Friday. We have been using Santyl under Hydrofera Blue. No concern for infection. 02/23/2022: Once again, the wound has been measured about the same size, but I think it looks a bit smaller. There is slough at the base. She had her ABIs done last week and these show severe bilateral peripheral arterial disease. We received the report of the foot x-ray that was ordered. There is no concern for osteomyelitis at this time. 03/02/2022: The wound is smaller today with a small amount of slough at the base. We placed a referral to vein and vascular surgery last week in response to her severe peripheral arterial disease, but she does not yet have an appointment on the books. 03/16/2022: She saw Dr. Stanford [name] in the vascular surgery department yesterday. He did not think she was a good candidate for any revascularization options and suggested that she continue local wound care for another month. If she fails to heal in that time, they may consider an angiogram with potential intervention. The wound today is roughly the same size but there is less exposed bone at the base and the surface is clean without any substantial slough. 03/24/2022: The wound appears about the same. There is some periwound callus that has heaped up. We changed  her to Prisma silver collagen last week. 04/06/2022: No significant change  to the wound. It is neither improving nor deteriorating. She continues to accumulate some periwound callus around the margin. Bone is exposed at the base. [name], [name] ([id number]) [id number] Page 2 of 8 04/20/2022: The wound has improved quite a bit over the past 2 weeks. It is smaller and there is no real accumulation of periwound callus. She had follow-up with Dr. Stanford [name] yesterday and he felt that the wound was progressing well enough that no arterial intervention was necessary. 05/18/2022: The patient has been absent due to a fall from her wheelchair during her last transportation that was supposed to be to her clinic. Today, the wound is about the same size. There is still bone exposed at the base. There is periwound callus accumulation. No concern for infection. 06/01/2022: The wound is actually a little bit smaller today and the surface is not as pale. She still has accumulated periwound callus. 06/15/2022: The wound continues to contract, albeit slowly. No concern for infection. 06/28/2022: The wound is slightly smaller again today. She continues to accumulate callus and there is some periwound epibole at the margins. No concern for gross infection. 07/11/2022: The wound is roughly the same size with just a small opening in the center. She has been approved for Grafix. 07/18/2022: The wound has contracted somewhat and the surface has a healthier pink look to it. We applied Grafix last week and she is here for application #2. [id number]: The wound is about the same size but the surface continues to improve and appear more vital. She is here for Grafix application #3. [id number]: There is still a stubborn central portion of her toe wound that is not closing in yet. The bone remains exposed. She is here for Grafix application #4. [id number]: Today when I debrided the eschar and callus from her wound, purulent drainage was expressed from her wound. The underlying surface  is unchanged with bone exposed. She has been having more pain in the toe and this may explain why. 08/18/2022: The culture that I took last week grew out Candida and Acinetobacter. We changed her dressing regimen to terbinafine instead of gentamicin and I prescribed levofloxacin instead of Augmentin. T oday, the wound does not look that much different. It remains small with granulation tissue at the base, but bone remains exposed. 09/01/2022: The wound is about the same. I had debrided a fair amount of periwound callus and nonviable skin last week, so it does measure a little bit larger. The bone is still exposed. No obvious signs of infection. I requested a plain x-ray of the patient's foot a month ago and still have not received the results of that study, performed at her nursing facility. She continues to complain of pain in her toe and I am concerned that she has osteomyelitis. Based upon her previous ABIs, she does not have adequate blood flow to heal an amputation and she has failed to make much progress in terms of her wound healing. 09/15/2022: I did receive the results of the x-ray, but no actual images. No osteomyelitis was identified on the film. We referred her back to vascular surgery, but she does not yet have an appointment. The wound is unchanged. 09/29/2022: She was seen by Dr. Stanford [name] on November 7. He has offered her an angiogram with intervention if feasible. She is not sure if she is comfortable with going forward. Her wound is completely unchanged. 10/12/2022: The wound is unchanged. It sounds  like there has been a fair amount of discussion between the patient, her daughter, Dr. Stanford [name], and cardiology. It honestly sounds like the patient is not interested in any sort of vascular intervention. She is concerned about the possibility of stent placement, as well as the need for dye and its potential effect on her kidneys. I told the patient and her daughter, who was present via speaker  phone, without better blood flow, I did not think this wound had a chance of healing. 11/09/2022: 1 month follow-up. Ultimately, the patient elected not to undergo any arteriography and intervention. T oday, she presents with the tip of her toe necrotic. There is no purulent drainage or malodor, but the tip of her toe is black. The wound is larger and there is dead skin peeling away from the wound opening. 11/24/2022: The tip of her toe demonstrates dry gangrene. They have been painting it with Betadine at her care facility. No overt signs of infection. Electronic Signature(s) Signed: 11/24/2022 2:53:08 PM By: [name] Maudlin MD FACS Entered By: [name] on 11/24/2022 14:53:08 -------------------------------------------------------------------------------- Physical Exam Details Patient Name: Date of Service: [name] 11/24/2022 1:15 PM Medical Record Number: [id number] Patient Account Number: [id number] Date of Birth/Sex: Treating RN: [name] (89 y.o. F) Primary Care Provider: SYSTEM, PRO V [name] Other Clinician: Referring Provider: Treating Provider/Extender: [name] in Treatment: 42 Constitutional Hypertensive, asymptomatic. . . . no acute distress. Respiratory Normal work of breathing on room air. Notes 11/24/2022: The tip of her toe demonstrates dry gangrene. No overt signs of infection. Electronic Signature(s) Signed: 11/24/2022 2:53:51 PM By: [name] Maudlin MD FACS [place], [name] ([id number]) By: [name] Maudlin MD FACS [phone].pdf Page 3 of 8 Signed: 11/24/2022 2:53:51 PM Entered By: [name] on 11/24/2022 14:53:51 -------------------------------------------------------------------------------- Physician Orders Details Patient Name: Date of Service: [name], [name] 11/24/2022 1:15 PM Medical Record Number: [id number] Patient Account Number: [id number] Date of Birth/Sex: Treating RN: [birth year] (89 y.o. 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 - Dr Celine Ahr Room 3 Anesthetic (In clinic) Topical Lidocaine 4% applied to wound bed - In Clinic Off-Loading Other: - No direct pressure on toes Wound Treatment Wound #1 - T Great oe Wound Laterality: Left Cleanser: Wound Cleanser 2 x Per Day/30 Days Discharge Instructions: Cleanse the wound with wound cleanser prior to applying a clean dressing using gauze sponges, not tissue or cotton balls. Topical: Betadine 2 x Per Day/30 Days Discharge Instructions: Paint Left Gr.T 2x per day. Then when dry, place gauze and tape oe Secondary Dressing: Woven Gauze Sponges 2x2 in 2 x Per Day/30 Days Discharge Instructions: Apply over primary dressing as directed. Secured With: Paper Tape, 1x10 (in/yd) 2 x Per Day/30 Days Discharge Instructions: Secure dressing with tape as directed. Electronic Signature(s) Signed: 11/24/2022 3:55:51 PM By: [name] Maudlin MD FACS Entered By: [name] on 11/24/2022 14:54:03 -------------------------------------------------------------------------------- Problem List Details Patient Name: Date of Service: [name], [name] 11/24/2022 1:15 PM Medical Record Number: [id number] Patient Account Number: [id number] Date of Birth/Sex: Treating RN: [name] (89 y.o. F) Primary Care Provider: SYSTEM, PRO V [name] Other Clinician: Referring Provider:  Treating Provider/Extender: [name] in Treatment: [address], [place] ([id number])  [id number] Page 4 of 8 Active Problems ICD-10 Encounter Code Description Active Date MDM Diagnosis L97.522 Non-pressure chronic ulcer of other part of left foot with fat layer exposed 02/01/2022 No Yes E11.621 Type 2 diabetes mellitus with foot ulcer 02/01/2022 No Yes 99991111 Chronic diastolic (congestive) heart failure 02/01/2022 No Yes I27.20 Pulmonary hypertension, unspecified 02/01/2022 No Yes Inactive Problems Resolved Problems Electronic Signature(s) Signed: 11/24/2022 2:52:08 PM By: [name] Maudlin MD FACS Entered By: [name] on 11/24/2022 14:52:08 -------------------------------------------------------------------------------- Progress Note Details Patient Name: Date of Service: [name]. 11/24/2022 1:15 PM Medical Record Number: [id number] Patient Account Number: [id number] Date of Birth/Sex: Treating RN: [birth year] [name] (89 y.o. F) Primary Care Provider: SYSTEM, PRO V [name] Other Clinician: Referring Provider: Treating Provider/Extender: [name] in Treatment: 42 Subjective Chief Complaint Information obtained from Patient Patient presents to the wound care center with open non-healing surgical wound(s) History of Present Illness (HPI) ADMISSION 02/01/2022 This is an 89 year old woman who is a resident of [place]. She has a past medical history notable for type 2 diabetes mellitus, congestive heart failure, pulmonary hypertension, and complete heart block. She is accompanied today by her son. Approximately 4 months ago, she had an ingrown toe nail removed. Apparently this went a bit deep. Since that time, it has not healed. The patient is nonambulatory. Apparently they have been applying Santyl to the site at her facility. She was on antibiotics but I am not certain which specific drugs she took. She completed her last course a few weeks ago. We were unable to obtain ABIs in clinic secondary to the patient's body  habitus. She has not had any formal vascular studies nor any x-ray of the area. 02/09/2022: The wound is roughly the same size today, but the amount of slough in the base has decreased significantly. She apparently had an x-ray done at her facility, but we do not have the report; per verbal report from facility staff, "there was nothing there." We had requested formal ABI/TBI studies, but these were not done. She has been in [place] under [employer]. 02/16/2022: The wound measured about the same today, but to my eye, it appears rather smaller. The slough in the base is minimal. Her vascular studies are scheduled for Friday. We have been using Santyl under [facility] [employer]. No concern for infection. 02/23/2022: Once again, the wound has been measured about the same size, but I think it looks a bit smaller. There is slough at the base. She had her ABIs done last week and these show severe bilateral peripheral arterial disease. We received the report of the foot x-ray that was ordered. There is no concern for osteomyelitis at this time. 03/02/2022: The wound is smaller today with a small amount of slough at the base. We placed a referral to vein and vascular surgery last week in response to her severe peripheral arterial disease, but she does not yet have an appointment on the books. [name], [name] ([id number]) [id number] Page 5 of 8 03/16/2022: She saw Dr. Stanford [name] in the vascular surgery department yesterday. He did not think she was a good candidate for any revascularization options and suggested that she continue local wound care for another month. If she fails to heal in that time, they may consider an angiogram with potential intervention. The wound today is roughly the same size but there is less exposed bone at the base and the surface is clean without  any substantial slough. 03/24/2022: The wound appears about the same. There is some periwound callus that has heaped up. We  changed her to Prisma silver collagen last week. 04/06/2022: No significant change to the wound. It is neither improving nor deteriorating. She continues to accumulate some periwound callus around the margin. Bone is exposed at the base. 04/20/2022: The wound has improved quite a bit over the past 2 weeks. It is smaller and there is no real accumulation of periwound callus. She had follow-up with Dr. Stanford [name] yesterday and he felt that the wound was progressing well enough that no arterial intervention was necessary. 05/18/2022: The patient has been absent due to a fall from her wheelchair during her last transportation that was supposed to be to her clinic. Today, the wound is about the same size. There is still bone exposed at the base. There is periwound callus accumulation. No concern for infection. 06/01/2022: The wound is actually a little bit smaller today and the surface is not as pale. She still has accumulated periwound callus. 06/15/2022: The wound continues to contract, albeit slowly. No concern for infection. 06/28/2022: The wound is slightly smaller again today. She continues to accumulate callus and there is some periwound epibole at the margins. No concern for gross infection. 07/11/2022: The wound is roughly the same size with just a small opening in the center. She has been approved for Grafix. 07/18/2022: The wound has contracted somewhat and the surface has a healthier pink look to it. We applied Grafix last week and she is here for application #2. [id number]: The wound is about the same size but the surface continues to improve and appear more vital. She is here for Grafix application #3. [id number]: There is still a stubborn central portion of her toe wound that is not closing in yet. The bone remains exposed. She is here for Grafix application #4. [id number]: Today when I debrided the eschar and callus from her wound, purulent drainage was expressed from her wound. The underlying surface  is unchanged with bone exposed. She has been having more pain in the toe and this may explain why. 08/18/2022: The culture that I took last week grew out Candida and Acinetobacter. We changed her dressing regimen to terbinafine instead of gentamicin and I prescribed levofloxacin instead of Augmentin. T oday, the wound does not look that much different. It remains small with granulation tissue at the base, but bone remains exposed. 09/01/2022: The wound is about the same. I had debrided a fair amount of periwound callus and nonviable skin last week, so it does measure a little bit larger. The bone is still exposed. No obvious signs of infection. I requested a plain x-ray of the patient's foot a month ago and still have not received the results of that study, performed at her nursing facility. She continues to complain of pain in her toe and I am concerned that she has osteomyelitis. Based upon her previous ABIs, she does not have adequate blood flow to heal an amputation and she has failed to make much progress in terms of her wound healing. 09/15/2022: I did receive the results of the x-ray, but no actual images. No osteomyelitis was identified on the film. We referred her back to vascular surgery, but she does not yet have an appointment. The wound is unchanged. 09/29/2022: She was seen by Dr. Stanford [name] on November 7. He has offered her an angiogram with intervention if feasible. She is not sure if she is comfortable with going forward.  Her wound is completely unchanged. 10/12/2022: The wound is unchanged. It sounds like there has been a fair amount of discussion between the patient, her daughter, Dr. Stanford [name], and cardiology. It honestly sounds like the patient is not interested in any sort of vascular intervention. She is concerned about the possibility of stent placement, as well as the need for dye and its potential effect on her kidneys. I told the patient and her daughter, who was present via speaker  phone, without better blood flow, I did not think this wound had a chance of healing. 11/09/2022: 1 month follow-up. Ultimately, the patient elected not to undergo any arteriography and intervention. T oday, she presents with the tip of her toe necrotic. There is no purulent drainage or malodor, but the tip of her toe is black. The wound is larger and there is dead skin peeling away from the wound opening. 11/24/2022: The tip of her toe demonstrates dry gangrene. They have been painting it with Betadine at her care facility. No overt signs of infection. Patient History Social History Never smoker, Marital Status - Widowed, Alcohol Use - Never, Drug Use - No History, Caffeine Use - Rarely. Medical History Cardiovascular Patient has history of Arrhythmia - AV block, complete heart block, Congestive Heart Failure, Hypertension Endocrine Patient has history of Type II Diabetes Musculoskeletal Patient has history of Gout, Osteoarthritis Medical A Surgical History Notes nd Cardiovascular Pulmonary Hypertension Endocrine Hypoglycemia Neurologic Acute encephalopathy Objective [name], [name] ([id number]) [id number] Page 6 of 8 Constitutional Hypertensive, asymptomatic. no acute distress. Vitals Time Taken: 1:53 AM, Height: 61 in, Weight: 203 lbs, BMI: 38.4, Temperature: 97.9 F, Pulse: 84 bpm, Respiratory Rate: 20 breaths/min, Blood Pressure: 153/81 mmHg. Respiratory Normal work of breathing on room air. General Notes: 11/24/2022: The tip of her toe demonstrates dry gangrene. No overt signs of infection. Integumentary (Hair, Skin) Wound #1 status is Open. Original cause of wound was Gradually Appeared. The date acquired was: 09/29/2021. The wound has been in treatment 42 weeks. The wound is located on the Left T Great. The wound measures 0.8cm length x 0.9cm width x 0.1cm depth; 0.565cm^2 area and 0.057cm^3 volume. There is Fat oe Layer (Subcutaneous Tissue)  exposed. There is no tunneling or undermining noted. There is a small amount of serous drainage noted. The wound margin is flat and intact. There is small (1-33%) red granulation within the wound bed. There is a large (67-100%) amount of necrotic tissue within the wound bed including Eschar and Adherent Slough. The periwound skin appearance had no abnormalities noted for moisture. The periwound skin appearance had no abnormalities noted for color. The periwound skin appearance exhibited: Scarring. The periwound skin appearance did not exhibit: Induration. Periwound temperature was noted as No Abnormality. Assessment Active Problems ICD-10 Non-pressure chronic ulcer of other part of left foot with fat layer exposed Type 2 diabetes mellitus with foot ulcer Chronic diastolic (congestive) heart failure Pulmonary hypertension, unspecified Plan Follow-up Appointments: Return appointment in 1 month. - Dr Celine Ahr Room 3 Anesthetic: (In clinic) Topical Lidocaine 4% applied to wound bed - In Clinic Off-Loading: Other: - No direct pressure on toes WOUND #1: - T Great Wound Laterality: Left oe Cleanser: Wound Cleanser 2 x Per Day/30 Days Discharge Instructions: Cleanse the wound with wound cleanser prior to applying a clean dressing using gauze sponges, not tissue or cotton balls. Topical: Betadine 2 x Per Day/30 Days Discharge Instructions: Paint Left Gr.T 2x per day. Then when dry, place gauze and tape oe Secondary Dressing: Woven  Gauze Sponges 2x2 in 2 x Per Day/30 Days Discharge Instructions: Apply over primary dressing as directed. Secured With: Paper T ape, 1x10 (in/yd) 2 x Per Day/30 Days Discharge Instructions: Secure dressing with tape as directed. 11/24/2022: The tip of her toe demonstrates dry gangrene. No overt signs of infection. The patient's son was with her today and we discussed the possibilities going forward. I reiterated that the goal at this point is to simply try to avoid  infection and sepsis related to the toe. We will continue to paint the toe twice a day with Betadine. [name] expressed a desire to continue to have follow-up with me so I will see her in 1 month. Electronic Signature(s) Signed: 11/24/2022 2:54:53 PM By: [name] Maudlin MD FACS Entered By: [name] on 11/24/2022 14:54:53 -------------------------------------------------------------------------------- HxROS Details Patient Name: Date of Service: [name], [name] 11/24/2022 1:15 PM [name] ([id number]) [id number] Page 7 of 8 Medical Record Number: [id number] Patient Account Number: [id number] Date of Birth/Sex: Treating RN: [name] (89 y.o. F) Primary Care Provider: SYSTEM, PRO V [name] Other Clinician: Referring Provider: Treating Provider/Extender: [name] in Treatment: 42 Cardiovascular Medical History: Positive for: Arrhythmia - AV block, complete heart block; Congestive Heart Failure; Hypertension Past Medical History Notes: Pulmonary Hypertension Endocrine Medical History: Positive for: Type II Diabetes Past Medical History Notes: Hypoglycemia Treated with: Oral agents Blood sugar tested every day: Yes Tested : 2-3x a day Musculoskeletal Medical History: Positive for: Gout; Osteoarthritis Neurologic Medical History: Past Medical History Notes: Acute encephalopathy Immunizations Pneumococcal Vaccine: Received Pneumococcal Vaccination: No Implantable Devices No devices added Family and Social History Never smoker; Marital Status - Widowed; Alcohol Use: Never; Drug Use: No History; Caffeine Use: Rarely; Financial Concerns: No; Food, Clothing or Shelter Needs: No; Support System Lacking: No; Transportation Concerns: No Engineer, maintenance) Signed: 11/24/2022 3:55:51 PM By: [name] Maudlin MD FACS Entered By: [name] on 11/24/2022  14:53:15 -------------------------------------------------------------------------------- SuperBill Details Patient Name: Date of Service: [name]. 11/24/2022 Medical Record Number: [id number] Patient Account Number: [id number] Date of Birth/Sex: Treating RN: [name][birth year] (89 y.o. F) Primary Care Provider: SYSTEM, PRO V [name] Other Clinician: Referring Provider: Treating Provider/Extender: [name] in Treatment: 42 Diagnosis Coding ICD-10 Codes Code Description L97.522 Non-pressure chronic ulcer of other part of left foot with fat layer exposed E11.621 Type 2 diabetes mellitus with foot ulcer [id number] Chronic diastolic (congestive) heart failure [name], [name] ([id number]) [id number] Page 8 of 8 I27.20 Pulmonary hypertension, unspecified Facility Procedures : CPT4 Code: [id number] Description: [id number] - WOUND CARE VISIT-LEV 3 EST PT Modifier: Quantity: 1 Physician Procedures : CPT4 Code Description Modifier [id number] - WC PHYS LEVEL 3 - EST PT ICD-10 Diagnosis Description L97.522 Non-pressure chronic ulcer of other part of left foot with fat layer exposed E11.621 Type 2 diabetes mellitus with foot ulcer [id number] Chronic  diastolic (congestive) heart failure I27.20 Pulmonary hypertension, unspecified Quantity: 1 Electronic Signature(s) Signed: 11/24/2022 4:42:06 PM By: Dellie Catholic RN Signed: 11/25/2022 7:32:36 AM By: [name] Maudlin MD FACS Previous Signature: 11/24/2022 2:55:06 PM Version By: [name] Maudlin MD FACS Entered By: Dellie Catholic on 11/24/2022 16:40:15

## 2022-11-25 NOTE — Progress Notes (Signed)
[name], [name] ([id number]) [id number] Page 1 of 9 Visit Report for 11/24/2022 Arrival Information Details Patient Name: Date of Service: [name], [name] 11/24/2022 1:15 PM Medical Record Number: [id number] Patient Account Number: [id number] Date of Birth/Sex: Treating RN: [name][birth year] (89 y.o. F) Primary Care Freeman Borba: SYSTEM, PRO V IDER Other Clinician: Referring Euretha Najarro: Treating Illona Bulman/Extender: Jadene Pierini in Treatment: 42 Visit Information History Since Last Visit All ordered tests and consults were completed: No Patient Arrived: Wheel Chair Added or deleted any medications: No Arrival Time: 13:52 Any new allergies or adverse reactions: No Accompanied By: son Had a fall or experienced change in No Transfer Assistance: None activities of daily living that may affect Patient Identification Verified: Yes risk of falls: Secondary Verification Process Completed: Yes Signs or symptoms of abuse/neglect since last visito No Patient Requires Transmission-Based Precautions: No Hospitalized since last visit: No Patient Has Alerts: Yes Implantable device outside of the clinic excluding No Patient Alerts: ABI not obtainable cellular tissue based products placed in the center As per [facility] L ABI 0.49 since last visit: As per [facility] R ABI 0.74 Pain Present Now: No Electronic Signature(s) Signed: 11/24/2022 2:41:36 PM By: Worthy Rancher Entered By: Worthy Rancher on 11/24/2022 13:53:11 -------------------------------------------------------------------------------- Clinic Level of Care Assessment Details Patient Name: Date of Service: [name], [name] 11/24/2022 1:15 PM Medical Record Number: [id number] Patient Account Number: [id number] Date of Birth/Sex: Treating RN: [name] (89 y.o. [place] Remigio Primary Care Myrle Wanek: SYSTEM, PRO V IDER Other Clinician: Referring Love Chowning: Treating Lejon Afzal/Extender: Jadene Pierini in Treatment:  42 Clinic Level of Care Assessment Items TOOL 4 Quantity Score X- 1 0 Use when only an EandM is performed on FOLLOW-UP visit ASSESSMENTS - Nursing Assessment / Reassessment X- 1 10 Reassessment of Co-morbidities (includes updates in patient status) X- 1 5 Reassessment of Adherence to Treatment Plan ASSESSMENTS - Wound and Skin A ssessment / Reassessment X - Simple Wound Assessment / Reassessment - one wound 1 5 X- 1 5 Complex Wound Assessment / Reassessment - multiple wounds X- 1 10 Dermatologic / Skin Assessment (not related to wound area) ASSESSMENTS - Focused Assessment []  - 0 Circumferential Edema Measurements - multi extremities []  - 0 Nutritional Assessment / Counseling / Intervention [name], [name] ([id number]) [id number] Page 2 of 9 []  - 0 Lower Extremity Assessment (monofilament, tuning fork, pulses) []  - 0 Peripheral Arterial Disease Assessment (using hand held doppler) ASSESSMENTS - Ostomy and/or Continence Assessment and Care []  - 0 Incontinence Assessment and Management []  - 0 Ostomy Care Assessment and Management (repouching, etc.) PROCESS - Coordination of Care []  - 0 Simple Patient / Family Education for ongoing care X- 1 20 Complex (extensive) Patient / Family Education for ongoing care X- 1 10 Staff obtains Programmer, systems, Records, T Results / Process Orders est []  - 0 Staff telephones HHA, Nursing Homes / Clarify orders / etc []  - 0 Routine Transfer to another Facility (non-emergent condition) []  - 0 Routine Hospital Admission (non-emergent condition) []  - 0 New Admissions / Biomedical engineer / Ordering NPWT Apligraf, etc. , []  - 0 Emergency Hospital Admission (emergent condition) X- 1 10 Simple Discharge Coordination []  - 0 Complex (extensive) Discharge Coordination PROCESS - Special Needs []  - 0 Pediatric / Minor Patient Management []  - 0 Isolation Patient Management []  - 0 Hearing / Language / Visual  special needs []  - 0 Assessment of Community assistance (transportation, D/C planning, etc.) []  - 0 Additional assistance / Altered mentation []  - 0 Support Surface(s) Assessment (bed,  cushion, seat, etc.) INTERVENTIONS - Wound Cleansing / Measurement X - Simple Wound Cleansing - one wound 1 5 []  - 0 Complex Wound Cleansing - multiple wounds X- 1 5 Wound Imaging (photographs - any number of wounds) []  - 0 Wound Tracing (instead of photographs) X- 1 5 Simple Wound Measurement - one wound []  - 0 Complex Wound Measurement - multiple wounds INTERVENTIONS - Wound Dressings X - Small Wound Dressing one or multiple wounds 1 10 []  - 0 Medium Wound Dressing one or multiple wounds []  - 0 Large Wound Dressing one or multiple wounds []  - 0 Application of Medications - topical []  - 0 Application of Medications - injection INTERVENTIONS - Miscellaneous []  - 0 External ear exam []  - 0 Specimen Collection (cultures, biopsies, blood, body fluids, etc.) []  - 0 Specimen(s) / Culture(s) sent or taken to Lab for analysis []  - 0 Patient Transfer (multiple staff / Civil Service fast streamer / Similar devices) []  - 0 Simple Staple / Suture removal (25 or less) []  - 0 Complex Staple / Suture removal (26 or more) []  - 0 Hypo / Hyperglycemic Management (close monitor of Blood Glucose) [name], [name] ([id number]) [id number] Page 3 of 9 []  - 0 Ankle / Brachial Index (ABI) - do not check if billed separately X- 1 5 Vital Signs Has the patient been seen at the hospital within the last three years: Yes Total Score: 105 Level Of Care: New/Established - Level 3 Electronic Signature(s) Signed: 11/24/2022 4:42:06 PM By: Dellie Catholic RN Entered By: Dellie Catholic on 11/24/2022 16:40:05 -------------------------------------------------------------------------------- Encounter Discharge Information Details Patient Name: Date of Service: [name], [name] Camps. 11/24/2022 1:15 PM Medical  Record Number: [id number] Patient Account Number: [id number] Date of Birth/Sex: Treating RN: [name] (89 y.o. [place] Livas Primary Care Tamikia Chowning: SYSTEM, PRO Ave Filter Other Clinician: Referring Gabrial Poppell: Treating Johm Pfannenstiel/Extender: Jadene Pierini in Treatment: 42 Encounter Discharge Information Items Discharge Condition: Stable Ambulatory Status: Wheelchair Discharge Destination: Home Transportation: Private Auto Accompanied By: son and caregiver Schedule Follow-up Appointment: Yes Clinical Summary of Care: Patient Declined Electronic Signature(s) Signed: 11/24/2022 4:42:06 PM By: Dellie Catholic RN Entered By: Dellie Catholic on 11/24/2022 16:41:22 -------------------------------------------------------------------------------- Lower Extremity Assessment Details Patient Name: Date of Service: [name], [name] 11/24/2022 1:15 PM Medical Record Number: [id number] Patient Account Number: [id number] Date of Birth/Sex: Treating RN: [birth year] (89 y.o. [place] Josephs Primary Care Gladys Gutman: SYSTEM, PRO Ave Filter Other Clinician: Referring Maryl Blalock: Treating Janeshia Ciliberto/Extender: Jadene Pierini in Treatment: 42 Edema Assessment Assessed: [Left: No] [Right: No] [Left: Edema] [Right: :] Calf Left: Right: Point of Measurement: 26 cm From Medial Instep Ankle Left: Right: Point of Measurement: 10 cm From Medial Instep Vascular Assessment SINIA, [name] ([id number]) [Right:[id number] Page 4 of 9] Pulses: Dorsalis Pedis Palpable: [Left:Yes] Electronic Signature(s) Signed: 11/24/2022 4:42:06 PM By: Dellie Catholic RN Entered By: Dellie Catholic on 11/24/2022 14:13:34 -------------------------------------------------------------------------------- Multi Wound Chart Details Patient Name: Date of Service: [name]. 11/24/2022 1:15 PM Medical Record Number: [id number] Patient Account Number: [id number] Date of Birth/Sex: Treating  RN: [birth year]-[name] (89 y.o. F) Primary Care Elsye Mccollister: SYSTEM, PRO V IDER Other Clinician: Referring Vita Currin: Treating Kellene Mccleary/Extender: Jadene Pierini in Treatment: 42 Vital Signs Height(in): 61 Pulse(bpm): 57 Weight(lbs): 203 Blood Pressure(mmHg): 153/81 Body Mass Index(BMI): 38.4 Temperature(F): 97.9 Respiratory Rate(breaths/min): 20 [1:Photos:] [N/A:N/A] Left T Great oe N/A N/A Wound Location: Gradually Appeared N/A N/A Wounding Event: Diabetic Wound/Ulcer of the Lower N/A N/A Primary Etiology: Extremity Arrhythmia, Congestive Heart Failure, N/A N/A Comorbid History: Hypertension,  Type II Diabetes, Gout, Osteoarthritis 09/29/2021 N/A N/A Date Acquired: 69 N/A N/A Weeks of Treatment: Open N/A N/A Wound Status: No N/A N/A Wound Recurrence: 0.8x0.9x0.1 N/A N/A Measurements L x W x D (cm) 0.565 N/A N/A A (cm) : rea 0.057 N/A N/A Volume (cm) : -99.60% N/A N/A % Reduction in A rea: 0.00% N/A N/A % Reduction in Volume: Grade 2 N/A N/A Classification: Small N/A N/A Exudate A mount: Serous N/A N/A Exudate Type: amber N/A N/A Exudate Color: Flat and Intact N/A N/A Wound Margin: Small (1-33%) N/A N/A Granulation A mount: Red N/A N/A Granulation Quality: Large (67-100%) N/A N/A Necrotic A mount: Eschar, Adherent Slough N/A N/A Necrotic Tissue: Fat Layer (Subcutaneous Tissue): Yes N/A N/A Exposed Structures: Fascia: No Tendon: No Muscle: No Joint: No Bone: No Small (1-33%) N/A N/A Epithelialization: Scarring: Yes N/A N/A Periwound Skin Texture: Induration: No No Abnormalities Noted N/A N/A [address][name], [name] ([id number]) [id number] Page 5 of 9 No Abnormalities Noted N/A N/A Periwound Skin Color: No Abnormality N/A N/A Temperature: Treatment Notes Electronic Signature(s) Signed: 11/24/2022 2:52:13 PM By: Fredirick Maudlin MD FACS Entered By: Fredirick Maudlin on 11/24/2022  14:52:13 -------------------------------------------------------------------------------- Multi-Disciplinary Care Plan Details Patient Name: Date of Service: [name], [name] Camps. 11/24/2022 1:15 PM Medical Record Number: [id number] Patient Account Number: [id number] Date of Birth/Sex: Treating RN: [name] (89 y.o. [place] Rimmer Primary Care Adyn Hoes: SYSTEM, PRO Ave Filter Other Clinician: Referring Magdeline Prange: Treating Jodeen Mclin/Extender: Jadene Pierini in Treatment: [place] reviewed with physician Active Inactive Nutrition Nursing Diagnoses: Impaired glucose control: actual or potential Potential for alteratiion in Nutrition/Potential for imbalanced nutrition Goals: Patient/caregiver will maintain therapeutic glucose control Date Initiated: 02/01/2022 Target Resolution Date: 03/21/2023 Goal Status: Active Interventions: Assess HgA1c results as ordered upon admission and as needed Assess patient nutrition upon admission and as needed per policy Provide education on elevated blood sugars and impact on wound healing Provide education on nutrition Treatment Activities: Education provided on Nutrition : 02/01/2022 Notes: Wound/Skin Impairment Nursing Diagnoses: Impaired tissue integrity Knowledge deficit related to ulceration/compromised skin integrity Goals: Patient/caregiver will verbalize understanding of skin care regimen Date Initiated: 02/01/2022 Date Inactivated: 05/18/2022 Target Resolution Date: 04/28/2022 Goal Status: Met Ulcer/skin breakdown will have a volume reduction of 30% by week 4 Date Initiated: 02/01/2022 Date Inactivated: 03/24/2022 Target Resolution Date: 03/31/2022 Goal Status: Unmet Unmet Reason: PAD Ulcer/skin breakdown will have a volume reduction of 50% by week 8 Date Initiated: 5/[name]/2023 Target Resolution Date: 03/21/2023 Goal Status: Active Interventions: Assess patient/caregiver ability to obtain necessary supplies Assess  patient/caregiver ability to perform ulcer/skin care regimen upon admission and as needed Assess ulceration(s) every visit [name], [name] ([id number]) [id number] Page 6 of 9 Provide education on ulcer and skin care Notes: Electronic Signature(s) Signed: 11/24/2022 4:42:06 PM By: Dellie Catholic RN Entered By: Dellie Catholic on 11/24/2022 16:37:45 -------------------------------------------------------------------------------- Pain Assessment Details Patient Name: Date of Service: [name] 11/24/2022 1:15 PM Medical Record Number: [id number] Patient Account Number: [id number] Date of Birth/Sex: Treating RN: [name] (89 y.o. F) Primary Care Keshonda Monsour: SYSTEM, PRO V IDER Other Clinician: Referring Joelee Snoke: Treating Belina Mandile/Extender: Jadene Pierini in Treatment: 42 Active Problems Location of Pain Severity and Description of Pain Patient Has Paino No Site Locations Pain Management and Medication Current Pain Management: Electronic Signature(s) Signed: 11/24/2022 2:41:36 PM By: Worthy Rancher Entered By: Worthy Rancher on 11/24/2022 13:54:20 -------------------------------------------------------------------------------- Patient/Caregiver Education Details Patient Name: Date of Service: [name] 1/4/2024andnbsp1:15 PM Medical Record Number: [id number]  Patient Account Number: [id number] Date of Birth/Gender: Treating RN: [name] (89 y.o. [place] Phenix Primary Care Physician: SYSTEM, PRO V IDER Other Clinician: Referring Physician: Treating Physician/Extender: Jadene Pierini in Treatment: [address] [place], [place] S ([id number]) [id number] Page 7 of 9 Education Provided To: Patient Education Topics Provided Wound/Skin Impairment: Methods: Explain/Verbal Responses: Return demonstration correctly Electronic Signature(s) Signed: 11/24/2022 4:42:06 PM By: Dellie Catholic  RN Entered By: Dellie Catholic on 11/24/2022 16:38:46 -------------------------------------------------------------------------------- Wound Assessment Details Patient Name: Date of Service: [name]. 11/24/2022 1:15 PM Medical Record Number: [id number] Patient Account Number: [id number] Date of Birth/Sex: Treating RN: [name], [name] (89 y.o. F) Primary Care Zaydenn Balaguer: SYSTEM, PRO V IDER Other Clinician: Referring Morene Cecilio: Treating Jaaziel Peatross/Extender: Jadene Pierini in Treatment: 42 Wound Status Wound Number: 1 Primary Diabetic Wound/Ulcer of the Lower Extremity Etiology: Wound Location: Left T Great oe Wound Open Wounding Event: Gradually Appeared Status: Date Acquired: 09/29/2021 Comorbid Arrhythmia, Congestive Heart Failure, Hypertension, Type II Weeks Of Treatment: 42 History: Diabetes, Gout, Osteoarthritis Clustered Wound: No Photos Wound Measurements Length: (cm) 0.8 Width: (cm) 0.9 Depth: (cm) 0.1 Area: (cm) 0.565 Volume: (cm) 0.057 % Reduction in Area: -99.6% % Reduction in Volume: 0% Epithelialization: Small (1-33%) Tunneling: No Undermining: No Wound Description Classification: Grade 2 Wound Margin: Flat and Intact Exudate Amount: Small Exudate Type: Serous Exudate Color: amber Foul Odor After Cleansing: No Slough/Fibrino Yes Wound Bed Granulation Amount: Small (1-33%) Exposed Structure Granulation Quality: Red Fascia Exposed: No Necrotic Amount: Large (67-100%) Fat Layer (Subcutaneous Tissue) Exposed: Yes Necrotic Quality: Eschar, Adherent Slough Tendon Exposed: No STEVANNA, KARREN ([id number]) [id number] Page 8 of 9 Muscle Exposed: No Joint Exposed: No Bone Exposed: No Periwound Skin Texture Texture Color No Abnormalities Noted: No No Abnormalities Noted: Yes Induration: No Temperature / Pain Scarring: Yes Temperature: No Abnormality Moisture No Abnormalities Noted: Yes Treatment Notes Wound #1 (Toe  Great) Wound Laterality: Left Cleanser Wound Cleanser Discharge Instruction: Cleanse the wound with wound cleanser prior to applying a clean dressing using gauze sponges, not tissue or cotton balls. Peri-Wound Care Topical Betadine Discharge Instruction: Paint Left Gr.T 2x per day. Then when dry, place gauze and tape oe Primary Dressing Secondary Dressing Woven Gauze Sponges 2x2 in Discharge Instruction: Apply over primary dressing as directed. Secured With Paper Tape, 1x10 (in/yd) Discharge Instruction: Secure dressing with tape as directed. Compression Wrap Compression Stockings Add-Ons Electronic Signature(s) Signed: 11/24/2022 4:42:06 PM By: Dellie Catholic RN Entered By: Dellie Catholic on 11/24/2022 14:13:53 -------------------------------------------------------------------------------- Vitals Details Patient Name: Date of Service: [name], [name] Camps. 11/24/2022 1:15 PM Medical Record Number: [id number] Patient Account Number: [id number] Date of Birth/Sex: Treating RN: [birth year] (89 y.o. F) Primary Care Muhammad Vacca: SYSTEM, PRO V IDER Other Clinician: Referring Analie Katzman: Treating Tangy Drozdowski/Extender: Jadene Pierini in Treatment: 42 Vital Signs Time Taken: 01:53 Temperature (F): 97.9 Height (in): 61 Pulse (bpm): 84 Weight (lbs): 203 Respiratory Rate (breaths/min): 20 Body Mass Index (BMI): 38.4 Blood Pressure (mmHg): 153/81 Reference Range: 80 - 120 mg / dl Electronic Signature(s) Signed: 11/24/2022 2:41:36 PM By: Worthy Rancher Entered By: Worthy Rancher on 11/24/2022 13:54:06 [name] ([id number]) [id number] Page 9 of 9

## 2022-12-22 ENCOUNTER — Other Ambulatory Visit: Payer: Self-pay

## 2022-12-22 ENCOUNTER — Encounter (HOSPITAL_BASED_OUTPATIENT_CLINIC_OR_DEPARTMENT_OTHER): Payer: Medicare Other | Attending: General Surgery | Admitting: General Surgery

## 2022-12-22 ENCOUNTER — Emergency Department (HOSPITAL_COMMUNITY): Payer: Medicare Other

## 2022-12-22 ENCOUNTER — Inpatient Hospital Stay (HOSPITAL_COMMUNITY)
Admission: EM | Admit: 2022-12-22 | Discharge: 2022-12-31 | DRG: 240 | Disposition: A | Discharge status: Skilled Nursing Facility | Payer: Medicare Other | Source: Ambulatory Visit | Attending: Internal Medicine | Admitting: Internal Medicine

## 2022-12-22 ENCOUNTER — Encounter (HOSPITAL_COMMUNITY): Payer: Self-pay | Admitting: Emergency Medicine

## 2022-12-22 DIAGNOSIS — L97522 Non-pressure chronic ulcer of other part of left foot with fat layer exposed: Secondary | ICD-10-CM | POA: Insufficient documentation

## 2022-12-22 DIAGNOSIS — E861 Hypovolemia: Secondary | ICD-10-CM | POA: Diagnosis present

## 2022-12-22 DIAGNOSIS — E785 Hyperlipidemia, unspecified: Secondary | ICD-10-CM | POA: Diagnosis present

## 2022-12-22 DIAGNOSIS — R5381 Other malaise: Secondary | ICD-10-CM | POA: Diagnosis present

## 2022-12-22 DIAGNOSIS — I96 Gangrene, not elsewhere classified: Secondary | ICD-10-CM

## 2022-12-22 DIAGNOSIS — Z90711 Acquired absence of uterus with remaining cervical stump: Secondary | ICD-10-CM | POA: Diagnosis not present

## 2022-12-22 DIAGNOSIS — E1151 Type 2 diabetes mellitus with diabetic peripheral angiopathy without gangrene: Secondary | ICD-10-CM | POA: Diagnosis not present

## 2022-12-22 DIAGNOSIS — Z950 Presence of cardiac pacemaker: Secondary | ICD-10-CM

## 2022-12-22 DIAGNOSIS — E1152 Type 2 diabetes mellitus with diabetic peripheral angiopathy with gangrene: Secondary | ICD-10-CM | POA: Diagnosis present

## 2022-12-22 DIAGNOSIS — Z833 Family history of diabetes mellitus: Secondary | ICD-10-CM

## 2022-12-22 DIAGNOSIS — I10 Essential (primary) hypertension: Secondary | ICD-10-CM | POA: Diagnosis not present

## 2022-12-22 DIAGNOSIS — M1990 Unspecified osteoarthritis, unspecified site: Secondary | ICD-10-CM | POA: Diagnosis not present

## 2022-12-22 DIAGNOSIS — E876 Hypokalemia: Secondary | ICD-10-CM | POA: Diagnosis present

## 2022-12-22 DIAGNOSIS — D649 Anemia, unspecified: Secondary | ICD-10-CM | POA: Diagnosis present

## 2022-12-22 DIAGNOSIS — I442 Atrioventricular block, complete: Secondary | ICD-10-CM | POA: Diagnosis present

## 2022-12-22 DIAGNOSIS — Z6838 Body mass index (BMI) 38.0-38.9, adult: Secondary | ICD-10-CM

## 2022-12-22 DIAGNOSIS — I5032 Chronic diastolic (congestive) heart failure: Secondary | ICD-10-CM | POA: Insufficient documentation

## 2022-12-22 DIAGNOSIS — E878 Other disorders of electrolyte and fluid balance, not elsewhere classified: Secondary | ICD-10-CM | POA: Diagnosis present

## 2022-12-22 DIAGNOSIS — L97529 Non-pressure chronic ulcer of other part of left foot with unspecified severity: Secondary | ICD-10-CM | POA: Diagnosis present

## 2022-12-22 DIAGNOSIS — Z7984 Long term (current) use of oral hypoglycemic drugs: Secondary | ICD-10-CM

## 2022-12-22 DIAGNOSIS — E1142 Type 2 diabetes mellitus with diabetic polyneuropathy: Secondary | ICD-10-CM | POA: Insufficient documentation

## 2022-12-22 DIAGNOSIS — M109 Gout, unspecified: Secondary | ICD-10-CM | POA: Diagnosis not present

## 2022-12-22 DIAGNOSIS — Z96641 Presence of right artificial hip joint: Secondary | ICD-10-CM | POA: Diagnosis present

## 2022-12-22 DIAGNOSIS — Z8249 Family history of ischemic heart disease and other diseases of the circulatory system: Secondary | ICD-10-CM | POA: Diagnosis not present

## 2022-12-22 DIAGNOSIS — E871 Hypo-osmolality and hyponatremia: Secondary | ICD-10-CM

## 2022-12-22 DIAGNOSIS — E11621 Type 2 diabetes mellitus with foot ulcer: Secondary | ICD-10-CM | POA: Insufficient documentation

## 2022-12-22 DIAGNOSIS — I509 Heart failure, unspecified: Secondary | ICD-10-CM | POA: Diagnosis not present

## 2022-12-22 DIAGNOSIS — I272 Pulmonary hypertension, unspecified: Secondary | ICD-10-CM | POA: Insufficient documentation

## 2022-12-22 DIAGNOSIS — I70262 Atherosclerosis of native arteries of extremities with gangrene, left leg: Secondary | ICD-10-CM | POA: Diagnosis present

## 2022-12-22 DIAGNOSIS — Z7401 Bed confinement status: Secondary | ICD-10-CM

## 2022-12-22 DIAGNOSIS — I110 Hypertensive heart disease with heart failure: Secondary | ICD-10-CM | POA: Insufficient documentation

## 2022-12-22 DIAGNOSIS — Z91013 Allergy to seafood: Secondary | ICD-10-CM

## 2022-12-22 DIAGNOSIS — G629 Polyneuropathy, unspecified: Secondary | ICD-10-CM

## 2022-12-22 DIAGNOSIS — Z79899 Other long term (current) drug therapy: Secondary | ICD-10-CM

## 2022-12-22 DIAGNOSIS — Z885 Allergy status to narcotic agent status: Secondary | ICD-10-CM

## 2022-12-22 DIAGNOSIS — E860 Dehydration: Secondary | ICD-10-CM | POA: Diagnosis present

## 2022-12-22 DIAGNOSIS — F419 Anxiety disorder, unspecified: Secondary | ICD-10-CM | POA: Diagnosis not present

## 2022-12-22 DIAGNOSIS — Z886 Allergy status to analgesic agent status: Secondary | ICD-10-CM

## 2022-12-22 DIAGNOSIS — I998 Other disorder of circulatory system: Secondary | ICD-10-CM | POA: Diagnosis not present

## 2022-12-22 LAB — COMPREHENSIVE METABOLIC PANEL
ALT: 10 U/L (ref 0–44)
ALT: 9 U/L (ref 0–44)
AST: 27 U/L (ref 15–41)
AST: 26 U/L (ref 15–41)
Albumin: 3.5 g/dL (ref 3.5–5.0)
Albumin: 3.3 g/dL — ABNORMAL LOW (ref 3.5–5.0)
Alkaline Phosphatase: 87 U/L (ref 38–126)
Alkaline Phosphatase: 76 U/L (ref 38–126)
Anion gap: 12 (ref 5–15)
Anion gap: 15 (ref 5–15)
BUN: 50 mg/dL — ABNORMAL HIGH (ref 8–23)
BUN: 49 mg/dL — ABNORMAL HIGH (ref 8–23)
CO2: 31 mmol/L (ref 22–32)
CO2: 26 mmol/L (ref 22–32)
Calcium: 9.8 mg/dL (ref 8.9–10.3)
Calcium: 9.6 mg/dL (ref 8.9–10.3)
Chloride: 88 mmol/L — ABNORMAL LOW (ref 98–111)
Chloride: 90 mmol/L — ABNORMAL LOW (ref 98–111)
Creatinine, Ser: 1.1 mg/dL — ABNORMAL HIGH (ref 0.44–1.00)
Creatinine, Ser: 1.05 mg/dL — ABNORMAL HIGH (ref 0.44–1.00)
GFR, Estimated: 48 mL/min — ABNORMAL LOW (ref >60)
GFR, Estimated: 51 mL/min — ABNORMAL LOW (ref >60)
Glucose, Bld: 128 mg/dL — ABNORMAL HIGH (ref 70–99)
Glucose, Bld: 119 mg/dL — ABNORMAL HIGH (ref 70–99)
Potassium: 2.8 mmol/L — ABNORMAL LOW (ref 3.5–5.1)
Potassium: 3.1 mmol/L — ABNORMAL LOW (ref 3.5–5.1)
Sodium: 131 mmol/L — ABNORMAL LOW (ref 135–145)
Sodium: 131 mmol/L — ABNORMAL LOW (ref 135–145)
Total Bilirubin: 0.5 mg/dL (ref 0.3–1.2)
Total Bilirubin: 0.6 mg/dL (ref 0.3–1.2)
Total Protein: 8.2 g/dL — ABNORMAL HIGH (ref 6.5–8.1)
Total Protein: 7.7 g/dL (ref 6.5–8.1)

## 2022-12-22 LAB — CBC WITH DIFFERENTIAL/PLATELET
Abs Immature Granulocytes: 0.05 10*3/uL (ref 0.00–0.07)
Basophils Absolute: 0 10*3/uL (ref 0.0–0.1)
Basophils Relative: 0 %
Eosinophils Absolute: 0.3 10*3/uL (ref 0.0–0.5)
Eosinophils Relative: 3 %
HCT: 41.8 % (ref 36.0–46.0)
Hemoglobin: 13.5 g/dL (ref 12.0–15.0)
Immature Granulocytes: 1 %
Lymphocytes Relative: 11 %
Lymphs Abs: 1.2 10*3/uL (ref 0.7–4.0)
MCH: 28.7 pg (ref 26.0–34.0)
MCHC: 32.3 g/dL (ref 30.0–36.0)
MCV: 88.7 fL (ref 80.0–100.0)
Monocytes Absolute: 0.8 10*3/uL (ref 0.1–1.0)
Monocytes Relative: 8 %
Neutro Abs: 8.2 10*3/uL — ABNORMAL HIGH (ref 1.7–7.7)
Neutrophils Relative %: 77 %
Platelets: 342 10*3/uL (ref 150–400)
RBC: 4.71 MIL/uL (ref 3.87–5.11)
RDW: 13.2 % (ref 11.5–15.5)
WBC: 10.6 10*3/uL — ABNORMAL HIGH (ref 4.0–10.5)
nRBC: 0 % (ref 0.0–0.2)

## 2022-12-22 LAB — CBG MONITORING, ED: Glucose-Capillary: 126 mg/dL — ABNORMAL HIGH (ref 70–99)

## 2022-12-22 LAB — C-REACTIVE PROTEIN: CRP: 2.7 mg/dL — ABNORMAL HIGH (ref <1.0)

## 2022-12-22 MED ORDER — MAGNESIUM HYDROXIDE 400 MG/5ML PO SUSP: 30.0000 mL | Freq: Every day | ORAL | Status: DC | PRN

## 2022-12-22 MED ORDER — NITROGLYCERIN 0.4 MG SL SUBL: 0.4000 mg | SUBLINGUAL_TABLET | SUBLINGUAL | Status: DC | PRN

## 2022-12-22 MED ORDER — ENOXAPARIN SODIUM 40 MG/0.4ML IJ SOSY
40.0000 mg | PREFILLED_SYRINGE | INTRAMUSCULAR | Status: DC
  Administered 2022-12-22 – 2022-12-30 (×9): 40 mg via SUBCUTANEOUS
  Filled 2022-12-22 (×10): qty 0.4

## 2022-12-22 MED ORDER — BIOTIN 5000 MCG PO CAPS: 5000.0000 ug | ORAL_CAPSULE | Freq: Every day | ORAL | Status: DC

## 2022-12-22 MED ORDER — HYDROCODONE-ACETAMINOPHEN 5-325 MG PO TABS
1.0000 | ORAL_TABLET | Freq: Four times a day (QID) | ORAL | Status: DC | PRN
  Administered 2022-12-22 – 2022-12-23 (×2): 1 via ORAL
  Filled 2022-12-22 (×2): qty 1

## 2022-12-22 MED ORDER — ACETAMINOPHEN 325 MG PO TABS
650.0000 mg | ORAL_TABLET | Freq: Four times a day (QID) | ORAL | Status: DC | PRN
  Filled 2022-12-22 (×2): qty 2

## 2022-12-22 MED ORDER — POTASSIUM CHLORIDE CRYS ER 20 MEQ PO TBCR
20.0000 meq | EXTENDED_RELEASE_TABLET | Freq: Once | ORAL | Status: AC
  Administered 2022-12-22: 20 meq via ORAL
  Filled 2022-12-22: qty 1

## 2022-12-22 MED ORDER — POLYETHYLENE GLYCOL 3350 17 GM/SCOOP PO POWD
17.0000 g | Freq: Every day | ORAL | Status: DC
  Filled 2022-12-22: qty 255

## 2022-12-22 MED ORDER — TRAZODONE HCL 50 MG PO TABS
25.0000 mg | ORAL_TABLET | Freq: Every evening | ORAL | Status: DC | PRN
  Administered 2022-12-23 – 2022-12-27 (×4): 25 mg via ORAL
  Filled 2022-12-22 (×5): qty 1

## 2022-12-22 MED ORDER — LINAGLIPTIN 5 MG PO TABS
5.0000 mg | ORAL_TABLET | Freq: Every day | ORAL | Status: DC
  Administered 2022-12-24 – 2022-12-31 (×7): 5 mg via ORAL
  Filled 2022-12-22 (×7): qty 1

## 2022-12-22 MED ORDER — SODIUM CHLORIDE 0.9 % IV BOLUS
500.0000 mL | Freq: Once | INTRAVENOUS | Status: AC
  Administered 2022-12-22: 500 mL via INTRAVENOUS

## 2022-12-22 MED ORDER — ACETAMINOPHEN 650 MG RE SUPP: 650.0000 mg | Freq: Four times a day (QID) | RECTAL | Status: DC | PRN

## 2022-12-22 MED ORDER — VITAMIN D 25 MCG (1000 UNIT) PO TABS
2000.0000 [IU] | ORAL_TABLET | Freq: Every morning | ORAL | Status: DC
  Administered 2022-12-24 – 2022-12-31 (×7): 2000 [IU] via ORAL
  Filled 2022-12-22 (×8): qty 2

## 2022-12-22 MED ORDER — VITAMINS A & D EX OINT
1.0000 | TOPICAL_OINTMENT | Freq: Four times a day (QID) | CUTANEOUS | Status: DC | PRN
  Administered 2022-12-27: 1 via TOPICAL
  Filled 2022-12-22: qty 56.7

## 2022-12-22 MED ORDER — ONDANSETRON HCL 4 MG PO TABS: 4.0000 mg | ORAL_TABLET | Freq: Four times a day (QID) | ORAL | Status: DC | PRN

## 2022-12-22 MED ORDER — POTASSIUM CHLORIDE 10 MEQ/100ML IV SOLN
10.0000 meq | Freq: Once | INTRAVENOUS | Status: AC
  Administered 2022-12-22: 10 meq via INTRAVENOUS
  Filled 2022-12-22: qty 100

## 2022-12-22 MED ORDER — MELATONIN 3 MG PO TABS
3.0000 mg | ORAL_TABLET | Freq: Every day | ORAL | Status: DC
  Administered 2022-12-23 – 2022-12-30 (×9): 3 mg via ORAL
  Filled 2022-12-22 (×9): qty 1

## 2022-12-22 MED ORDER — GABAPENTIN 300 MG PO CAPS
300.0000 mg | ORAL_CAPSULE | Freq: Two times a day (BID) | ORAL | Status: DC
  Administered 2022-12-23 – 2022-12-30 (×14): 300 mg via ORAL
  Filled 2022-12-22 (×14): qty 1

## 2022-12-22 MED ORDER — MAGNESIUM SULFATE 2 GM/50ML IV SOLN
2.0000 g | Freq: Once | INTRAVENOUS | Status: AC
  Administered 2022-12-22: 2 g via INTRAVENOUS
  Filled 2022-12-22: qty 50

## 2022-12-22 MED ORDER — CARVEDILOL 6.25 MG PO TABS
6.2500 mg | ORAL_TABLET | Freq: Two times a day (BID) | ORAL | Status: DC
  Administered 2022-12-23 – 2022-12-31 (×16): 6.25 mg via ORAL
  Filled 2022-12-22 (×16): qty 1

## 2022-12-22 MED ORDER — POTASSIUM CHLORIDE IN NACL 20-0.9 MEQ/L-% IV SOLN
INTRAVENOUS | Status: DC
  Filled 2022-12-22 (×4): qty 1000

## 2022-12-22 MED ORDER — MAGNESIUM OXIDE -MG SUPPLEMENT 400 (240 MG) MG PO TABS: 400.0000 mg | ORAL_TABLET | Freq: Every day | ORAL | Status: DC

## 2022-12-22 MED ORDER — POTASSIUM CHLORIDE ER 10 MEQ PO TBCR
40.0000 meq | EXTENDED_RELEASE_TABLET | Freq: Two times a day (BID) | ORAL | Status: DC
  Administered 2022-12-23: 40 meq via ORAL
  Filled 2022-12-22 (×3): qty 4

## 2022-12-22 MED ORDER — ONDANSETRON HCL 4 MG/2ML IJ SOLN: 4.0000 mg | Freq: Four times a day (QID) | INTRAMUSCULAR | Status: DC | PRN

## 2022-12-22 NOTE — ED Notes (Signed)
Got patient into a gown on the monitor did EKG shown to er provider got patient some warm blankets patient is resting with call bell in reach

## 2022-12-22 NOTE — Progress Notes (Signed)
[name] ([id number]) [id number] Page 1 of 9 Visit Report for 12/22/2022 Arrival Information Details Patient Name: Date of Service: [name], [name] 12/22/2022 12:45 PM Medical Record Number: [id number] Patient Account Number: [id number] Date of Birth/Sex: Treating RN: [birth year] (89 y.o. [name] Primary Care [name]: SYSTEM, PRO V IDER Other Clinician: Referring [name]: Treating [name]/Extender: [name] in Treatment: 46 Visit Information History Since Last Visit Added or deleted any medications: No Patient Arrived: Wheel Chair Any new allergies or adverse reactions: No Arrival Time: 12:45 Had a fall or experienced change in No Accompanied By: son activities of daily living that may affect Transfer Assistance: Manual risk of falls: Patient Requires Transmission-Based Precautions: No Signs or symptoms of abuse/neglect since last visito No Patient Has Alerts: Yes Hospitalized since last visit: No Patient Alerts: ABI not obtainable Implantable device outside of the clinic excluding No As per VVS L ABI 0.49 cellular tissue based products placed in the center As per VVS R ABI 0.74 since last visit: Has Dressing in Place as Prescribed: Yes Pain Present Now: Yes Electronic Signature(s) Signed: 12/22/2022 4:55:54 PM By: [name] Catholic RN Entered By: [name] on 12/22/2022 12:52:39 -------------------------------------------------------------------------------- Clinic Level of Care Assessment Details Patient Name: Date of Service: [name] 12/22/2022 12:45 PM Medical Record Number: [id number] Patient Account Number: [id number] Date of Birth/Sex: Treating RN: 03-30-1931 (89 y.o. [name] Primary Care [name]: SYSTEM, PRO V IDER Other Clinician: Referring [name]: Treating [name]/Extender: [name] in Treatment: 46 Clinic Level of Care Assessment Items TOOL 4 Quantity Score X-  1 0 Use when only an EandM is performed on FOLLOW-UP visit ASSESSMENTS - Nursing Assessment / Reassessment X- 1 10 Reassessment of Co-morbidities (includes updates in patient status) X- 1 5 Reassessment of Adherence to Treatment Plan ASSESSMENTS - Wound and Skin A ssessment / Reassessment X - Simple Wound Assessment / Reassessment - one wound 1 5 []  - 0 Complex Wound Assessment / Reassessment - multiple wounds []  - 0 Dermatologic / Skin Assessment (not related to wound area) ASSESSMENTS - Focused Assessment []  - 0 Circumferential Edema Measurements - multi extremities []  - 0 Nutritional Assessment / Counseling / Intervention [name], [name] ([id number]) [id number] Page 2 of 9 []  - 0 Lower Extremity Assessment (monofilament, tuning fork, pulses) []  - 0 Peripheral Arterial Disease Assessment (using hand held doppler) ASSESSMENTS - Ostomy and/or Continence Assessment and Care []  - 0 Incontinence Assessment and Management []  - 0 Ostomy Care Assessment and Management (repouching, etc.) PROCESS - Coordination of Care X - Simple Patient / Family Education for ongoing care 1 15 []  - 0 Complex (extensive) Patient / Family Education for ongoing care X- 1 10 Staff obtains Programmer, systems, Records, T Results / Process Orders est X- 1 10 Staff telephones HHA, Nursing Homes / Clarify orders / etc []  - 0 Routine Transfer to another Facility (non-emergent condition) []  - 0 Routine Hospital Admission (non-emergent condition) []  - 0 New Admissions / Biomedical engineer / Ordering NPWT Apligraf, etc. , []  - 0 Emergency Hospital Admission (emergent condition) X- 1 10 Simple Discharge Coordination []  - 0 Complex (extensive) Discharge Coordination PROCESS - Special Needs []  - 0 Pediatric / Minor Patient Management []  - 0 Isolation Patient Management []  - 0 Hearing / Language / Visual special needs X- 1 15 Assessment of Community assistance (transportation,  D/C planning, etc.) []  - 0 Additional assistance / Altered mentation []  - 0 Support Surface(s) Assessment (bed, cushion, seat, etc.) INTERVENTIONS - Wound  Cleansing / Measurement X - Simple Wound Cleansing - one wound 1 5 []  - 0 Complex Wound Cleansing - multiple wounds X- 1 5 Wound Imaging (photographs - any number of wounds) []  - 0 Wound Tracing (instead of photographs) X- 1 5 Simple Wound Measurement - one wound []  - 0 Complex Wound Measurement - multiple wounds INTERVENTIONS - Wound Dressings X - Small Wound Dressing one or multiple wounds 1 10 []  - 0 Medium Wound Dressing one or multiple wounds []  - 0 Large Wound Dressing one or multiple wounds []  - 0 Application of Medications - topical []  - 0 Application of Medications - injection INTERVENTIONS - Miscellaneous []  - 0 External ear exam []  - 0 Specimen Collection (cultures, biopsies, blood, body fluids, etc.) []  - 0 Specimen(s) / Culture(s) sent or taken to Lab for analysis X- 1 10 Patient Transfer (multiple staff / / Similar devices) []  - 0 Simple Staple / Suture removal (25 or less) []  - 0 Complex Staple / Suture removal (26 or more) []  - 0 Hypo / Hyperglycemic Management (close monitor of Blood Glucose) [name], [name] ( ) [id number] Page 3 of 9 []  - 0 Ankle / Brachial Index (ABI) - do not check if billed separately X- 1 5 Vital Signs Has the patient been seen at the hospital within the last three years: Yes Total Score: 120 Level Of Care: New/Established - Level 4 Electronic Signature(s) Signed: 12/22/2022 4:55:54 PM By: RN Entered By: on 12/22/2022 16:19:06 -------------------------------------------------------------------------------- Encounter Discharge Information Details Patient Name: Date of Service: . 12/22/2022 12:45 PM Medical Record Number: Nurse, adult Patient Account Number: Date of  Birth/Sex: Treating RN: 1930-11-27 (89 y.o. [name] Primary Care [name]: SYSTEM, PRO [id number] Other Clinician: Referring [name]: Treating [name]/Extender: 04-04-1970 in Treatment: 46 Encounter Discharge Information Items Discharge Condition: Stable Ambulatory Status: Wheelchair Discharge Destination: Other (Note Required) Telephoned: Yes Spoke With: 02/20/2023 Orders Sent: Yes Transportation: Ambulance Accompanied By: son Schedule Follow-up Appointment: Yes Clinical Summary of Care: Patient Declined Notes Pt. was transferred by ambulance to [facility] (Non-Emergency) Electronic Signature(s) Signed: 12/22/2022 4:55:54 PM By: 02/20/2023 RN Entered By: [name] on 12/22/2022 16:23:40 -------------------------------------------------------------------------------- Lower Extremity Assessment Details Patient Name: Date of Service: [name], [name] 12/22/2022 12:45 PM Medical Record Number: 11/16/1931 Patient Account Number: [id number] Date of Birth/Sex: Treating RN: 10-01-1931 (89 y.o. [name] Primary Care Ronnett Pullin: SYSTEM, PRO 47 Other Clinician: Referring Yassmin Binegar: Treating Bekka Qian/Extender: Alycia Rossetti in Treatment: 46 Edema Assessment Assessed: [Left: No] [Right: No] [Left: Edema] [Right: :] Calf Left: Right: Point of Measurement: 26 cm From Medial Instep Ankle [name], [name] (02/20/2023) [id number] Page 4 of 9 Left: Right: Point of Measurement: 10 cm From Medial Instep Vascular Assessment Pulses: Dorsalis Pedis Palpable: [Left:Yes] Electronic Signature(s) Signed: 12/22/2022 4:55:54 PM By: 02/20/2023 RN Entered By: [name] on 12/22/2022 12:54:15 -------------------------------------------------------------------------------- Multi Wound Chart Details Patient Name: Date of Service: [id number]. 12/22/2022 12:45 PM Medical Record Number: 11/16/1931 Patient Account  Number: [id number] Date of Birth/Sex: Treating RN: June 14, 1931 (89 y.o. F) Primary Care Mida Cory: SYSTEM, PRO V IDER Other Clinician: Referring Sally Reimers: Treating Markees Carns/Extender: [name] in Treatment: 46 Vital Signs Height(in): 61 Pulse(bpm): 89 Weight(lbs): 203 Blood Pressure(mmHg): 155/85 Body Mass Index(BMI): 38.4 Temperature(F): 98.4 Respiratory Rate(breaths/min): 18 [1:Photos:] [N/A:N/A] Left T Great oe N/A N/A Wound Location: Gradually Appeared N/A N/A Wounding Event: Diabetic Wound/Ulcer of the Lower N/A N/A Primary  Etiology: Extremity Arrhythmia, Congestive Heart Failure, N/A N/A Comorbid History: Hypertension, Type II Diabetes, Gout, Osteoarthritis 09/29/2021 N/A N/A Date Acquired: 82 N/A N/A Weeks of Treatment: Open N/A N/A Wound Status: No N/A N/A Wound Recurrence: 4x2.7x0.8 N/A N/A Measurements L x W x D (cm) 8.482 N/A N/A A (cm) : rea 6.786 N/A N/A Volume (cm) : -2897.20% N/A N/A % Reduction in A rea: -11805.30% N/A N/A % Reduction in Volume: Grade 2 N/A N/A Classification: Small N/A N/A Exudate A mount: Serous N/A N/A Exudate Type: amber N/A N/A Exudate Color: Flat and Intact N/A N/A Wound Margin: None Present (0%) N/A N/A Granulation A mount: Large (67-100%) N/A N/A Necrotic A mount: Eschar, Adherent Slough N/A N/A Necrotic Tissue: Fat Layer (Subcutaneous Tissue): Yes N/A N/A Exposed Structures: Fascia: No Tendon: No Muscle: No Joint: No VERNIECE, ENCARNACION ([id number]) [id number] Page 5 of 9 Bone: No Small (1-33%) N/A N/A Epithelialization: Scarring: Yes N/A N/A Periwound Skin Texture: Induration: No Maceration: Yes N/A N/A Periwound Skin Moisture: No Abnormalities Noted N/A N/A Periwound Skin Color: No Abnormality N/A N/A Temperature: Treatment Notes Electronic Signature(s) Signed: 12/22/2022 1:59:31 PM By: Fredirick Maudlin MD FACS Entered By: Fredirick Maudlin on 12/22/2022  13:59:31 -------------------------------------------------------------------------------- Multi-Disciplinary Care Plan Details Patient Name: Date of Service: [name]. 12/22/2022 12:45 PM Medical Record Number: [id number] Patient Account Number: [id number] Date of Birth/Sex: Treating RN: Apr 22, 1931 (89 y.o. [name] Frangos Primary Care Ailee Pates: SYSTEM, PRO Ave Filter Other Clinician: Referring Jerriann Schrom: Treating Jaimi Belle/Extender: [name] in Treatment: [place] reviewed with physician Active Inactive Nutrition Nursing Diagnoses: Impaired glucose control: actual or potential Potential for alteratiion in Nutrition/Potential for imbalanced nutrition Goals: Patient/caregiver will maintain therapeutic glucose control Date Initiated: 02/01/2022 Target Resolution Date: 03/21/2023 Goal Status: Active Interventions: Assess HgA1c results as ordered upon admission and as needed Assess patient nutrition upon admission and as needed per policy Provide education on elevated blood sugars and impact on wound healing Provide education on nutrition Treatment Activities: Education provided on Nutrition : 02/01/2022 Notes: Wound/Skin Impairment Nursing Diagnoses: Impaired tissue integrity Knowledge deficit related to ulceration/compromised skin integrity Goals: Patient/caregiver will verbalize understanding of skin care regimen Date Initiated: 02/01/2022 Date Inactivated: 05/18/2022 Target Resolution Date: 04/28/2022 Goal Status: Met Ulcer/skin breakdown will have a volume reduction of 30% by week 4 Date Initiated: 02/01/2022 Date Inactivated: 03/24/2022 Target Resolution Date: 03/31/2022 Goal Status: Unmet Unmet Reason: PAD Ulcer/skin breakdown will have a volume reduction of 50% by week 8 Date Initiated: 04/06/2022 Target Resolution Date: 03/21/2023 Goal Status: Active [name], [name] ([id number]) [id number] Page 6 of  9 Interventions: Assess patient/caregiver ability to obtain necessary supplies Assess patient/caregiver ability to perform ulcer/skin care regimen upon admission and as needed Assess ulceration(s) every visit Provide education on ulcer and skin care Notes: Electronic Signature(s) Signed: 12/22/2022 4:55:54 PM By: [name] Catholic RN Entered By: [name] on 12/22/2022 16:14:56 -------------------------------------------------------------------------------- Pain Assessment Details Patient Name: Date of Service: [name] 12/22/2022 12:45 PM Medical Record Number: [id number] Patient Account Number: [id number] Date of Birth/Sex: Treating RN: 1931/04/11 (89 y.o. [name] Saldivar Primary Care Deshunda Thackston: SYSTEM, PRO Ave Filter Other Clinician: Referring Dierra Riesgo: Treating Aianna Fahs/Extender: [name] in Treatment: 46 Active Problems Location of Pain Severity and Description of Pain Patient Has Paino Yes Site Locations Pain Location: Generalized Pain With Dressing Change: No Duration of the Pain. Constant / Intermittento Constant Rate the pain. Current Pain Level: 5 Worst Pain Level: 10 Least Pain Level: 3 Tolerable Pain Level:  5 Character of Pain Describe the Pain: Tender Pain Management and Medication Current Pain Management: Medication: Yes Cold Application: No Rest: Yes Massage: No Activity: No T.E.N.S.: No Heat Application: No Leg drop or elevation: No Is the Current Pain Management Adequate: Adequate How does your wound impact your activities of daily livingo Sleep: No Bathing: No Appetite: No Relationship With Others: No Bladder Continence: No Emotions: No Bowel Continence: No Work: No Toileting: No Drive: No Dressing: No Hobbies: No Electronic Signature(s) Signed: 12/22/2022 4:55:54 PM By: [name] Catholic RN Owens Shark, Lauramae S ([id number]) By: [name] Catholic RN [phone].pdf Page 7 of 9 Signed: 12/22/2022 4:55:54  PM Entered By: [name] on 12/22/2022 12:54:05 -------------------------------------------------------------------------------- Patient/Caregiver Education Details Patient Name: Date of Service: [name], [name] 2/1/2024andnbsp12:45 PM Medical Record Number: [id number] Patient Account Number: [id number] Date of Birth/Gender: Treating RN: Feb 23, 1931 (89 y.o. [name] Lizotte Primary Care Physician: SYSTEM, PRO V IDER Other Clinician: Referring Physician: Treating Physician/Extender: [name] in Treatment: 6 Education Assessment Education Provided To: Patient Education Topics Provided Wound/Skin Impairment: Methods: Explain/Verbal Responses: Return demonstration correctly Electronic Signature(s) Signed: 12/22/2022 4:55:54 PM By: [name] Catholic RN Entered By: [name] on 12/22/2022 16:15:10 -------------------------------------------------------------------------------- Wound Assessment Details Patient Name: Date of Service: [name] 12/22/2022 12:45 PM Medical Record Number: [id number] Patient Account Number: [id number] Date of Birth/Sex: Treating RN: 1931/03/11 (89 y.o. [name] Masters Primary Care Foxx Klarich: SYSTEM, PRO Ave Filter Other Clinician: Referring Monicia Tse: Treating Tashon Capp/Extender: [name] in Treatment: 46 Wound Status Wound Number: 1 Primary Diabetic Wound/Ulcer of the Lower Extremity Etiology: Wound Location: Left T Great oe Wound Open Wounding Event: Gradually Appeared Status: Date Acquired: 09/29/2021 Comorbid Arrhythmia, Congestive Heart Failure, Hypertension, Type II Weeks Of Treatment: 46 History: Diabetes, Gout, Osteoarthritis Clustered Wound: No Photos [name], [name] ([id number]) [id number] Page 8 of 9 Wound Measurements Length: (cm) 4 Width: (cm) 2.7 Depth: (cm) 0.8 Area: (cm) 8.482 Volume: (cm) 6.786 % Reduction in Area: -2897.2% % Reduction in Volume:  -11805.3% Epithelialization: Small (1-33%) Tunneling: No Undermining: No Wound Description Classification: Grade 2 Wound Margin: Flat and Intact Exudate Amount: Small Exudate Type: Serous Exudate Color: amber Foul Odor After Cleansing: No Slough/Fibrino Yes Wound Bed Granulation Amount: None Present (0%) Exposed Structure Necrotic Amount: Large (67-100%) Fascia Exposed: No Necrotic Quality: Eschar, Adherent Slough Fat Layer (Subcutaneous Tissue) Exposed: Yes Tendon Exposed: No Muscle Exposed: No Joint Exposed: No Bone Exposed: No Periwound Skin Texture Texture Color No Abnormalities Noted: No No Abnormalities Noted: Yes Induration: No Temperature / Pain Scarring: Yes Temperature: No Abnormality Moisture No Abnormalities Noted: No Maceration: Yes Treatment Notes Wound #1 (Toe Great) Wound Laterality: Left Cleanser Peri-Wound Care Topical Primary Dressing Secondary Dressing Secured With Compression Wrap Compression Stockings Add-Ons Electronic Signature(s) Signed: 12/22/2022 4:55:54 PM By: [name] Catholic RN Entered By: [name] on 12/22/2022 13:01:32 -------------------------------------------------------------------------------- Vitals Details Patient Name: Date of Service: [name]. 12/22/2022 12:45 PM Medical Record Number: [id number] Patient Account Number: [id number] Date of Birth/Sex: Treating RN: 1931/05/09 (89 y.o. [name] Savich Primary Care Ziara Thelander: SYSTEM, PRO Ave Filter Other Clinician: Referring Elham Fini: Treating Kayleana Waites/Extender: [name] in Treatment: [address] [name], [name] S ([id number]) [id number] Page 9 of 9 Time Taken: 12:48 Temperature (F): 98.4 Height (in): 61 Pulse (bpm): 89 Weight (lbs): 203 Respiratory Rate (breaths/min): 18 Body Mass Index (BMI): 38.4 Blood Pressure (mmHg): 155/85 Reference Range: 80 - 120 mg / dl Electronic Signature(s) Signed: 12/22/2022 4:55:54  PM By: [name] Catholic RN Entered By: Minus Liberty,  [name] on 12/22/2022 12:53:25

## 2022-12-22 NOTE — Assessment & Plan Note (Signed)
-  This  is manifested by current azotemia. - We will hydrate with IV normal saline and follow BMP as mentioned above.

## 2022-12-22 NOTE — H&P (Addendum)
[place]   PATIENT NAME: [name]    MR#:  [id number]  DATE OF BIRTH:  [birth year]  DATE OF ADMISSION:  12/22/2022  PRIMARY CARE PHYSICIAN: System, Provider Not In   Patient is coming from: Home  REQUESTING/REFERRING PHYSICIAN: Alphia Kava, MD   CHIEF COMPLAINT:   Chief Complaint  Patient presents with   Toe Pain    HISTORY OF PRESENT ILLNESS:  [name] is a 89 y.o. female with medical history significant for type 2 diabetes mellitus, hypertension, chronic diastolic CHF, s/p pacemaker placement, pulmonary hypertension, and previous history of hypokalemia, who presented to the emergency room with acute onset of left great toe necrosis and gangrene.  He was seen today by Dr. Lady Gary in the wound clinic.  Her big toes oozing frank pus and she was referred to the ER for potential need for amputation after angiography.  The patient admits to left big toe pain with mild purulent drainage and swelling extending to her foot dorsum.  She denies any fever or chills.  No nausea or vomiting or abdominal pain.  No chest pain or palpitations.  No dysuria, oliguria or hematuria or flank pain.   ED Course: Upon presentation to the emergency room, BP was 147/71 with otherwise normal vital signs.  Labs revealed hyponatremia 131 and hypochloremia of 88, hypokalemia of 2.8, BUN of 50 creatinine 1.1 above previous levels.  Total protein was 8.2.  Lactic acid was 2.7 and CBC showed WBC of 10.6 with mild neutrophilia. EKG as reviewed by me : EKG showed normal sinus rhythm with a rate of 86 with prolonged PR interval, nonspecific IVCD with LAD and LVH. Imaging: Left foot x-ray showed marked soft tissue swelling of the foot, with no evidence of osteomyelitis.  It showed pes planus deformity and collapse of the talus possibly CharcoAid arthropathy.  The patient was given 500 mill IV normal saline bolus, 2 g IV magnesium sulfate, 20 mill equivalent p.o. potassium chloride and 10 mill  equivalent IV potassium chloride.  Vascular surgery was notified about the patient.  She will be admitted to a medical telemetry bed for further evaluation and management. PAST MEDICAL HISTORY:   Past Medical History:  Diagnosis Date   Arthritis    "qwhere"   AV block     2:1  January 14, 2011 / pacemaker plan when cellulitis resolved in length   Bradycardia    2:1  AV block   Cellulitis    Superficial cellulitis right lower leg   January 14, 2011   Chronic diastolic CHF (congestive heart failure) (HCC)    Diabetes mellitus without complication (HCC)    Edema    . EF 65-70%, echo, November 29, 2010   Ejection fraction    EF 65%, echo, January, 2012   Hypertension    Hypokalemia    October, 2012   Murmur    No significant valvular disease by echo, January, 2012   Pacemaker    January, 2013 Medtronic Sensa   Pulmonary hypertension (HCC)     46 mmHg... echo... January, 2012    PAST SURGICAL HISTORY:   Past Surgical History:  Procedure Laterality Date   CHOLECYSTECTOMY     HIP ARTHROPLASTY Right    INSERT / REPLACE / REMOVE PACEMAKER  11/2011   PARTIAL HYSTERECTOMY     vaginal   PERMANENT PACEMAKER INSERTION N/A 12/09/2011   Procedure: PERMANENT PACEMAKER INSERTION;  Surgeon: Marinus Maw, MD;  Location: [facility] CATH LAB;  Service: Cardiovascular;  Laterality: N/A;   PPM GENERATOR CHANGEOUT N/A 01/03/2022   Procedure: PPM GENERATOR CHANGEOUT;  Surgeon: Evans Lance, MD;  Location: [place] CV LAB;  Service: Cardiovascular;  Laterality: N/A;    SOCIAL HISTORY:   Social History   Tobacco Use   Smoking status: Never   Smokeless tobacco: Never  Substance Use Topics   Alcohol use: No    FAMILY HISTORY:   Family History  Problem Relation Age of Onset   Cardiomyopathy Father    Diabetes Father    Heart failure Father    Dementia Mother    Cancer Son    Coronary artery disease Son    Heart attack Neg Hx     DRUG ALLERGIES:   Allergies  Allergen Reactions    Aspirin Rash and Other (See Comments)    GI distress   Tramadol Other (See Comments)    GI distress   Naproxen     Unknown reaction   Shellfish-Derived Products Itching and Rash    REVIEW OF SYSTEMS:   ROS As per history of present illness. All pertinent systems were reviewed above. Constitutional, HEENT, cardiovascular, respiratory, GI, GU, musculoskeletal, neuro, psychiatric, endocrine, integumentary and hematologic systems were reviewed and are otherwise negative/unremarkable except for positive findings mentioned above in the HPI.   MEDICATIONS AT HOME:   Prior to Admission medications   Medication Sig Start Date End Date Taking? Authorizing Provider  Biotin 5000 MCG CAPS Take 5,000 mcg by mouth daily. (0900)    [provider]  carvedilol (COREG) 6.25 MG tablet Take 6.25 mg by mouth 2 (two) times daily with a meal. (0900 & 1700)    [provider]  Cholecalciferol (VITAMIN D) 50 MCG (2000 UT) CAPS Take 2,000 Units by mouth in the morning. (0900)    [provider]  furosemide (LASIX) 80 MG tablet Take 80 mg by mouth in the morning. (0900) 09/19/17   [provider]  gabapentin (NEURONTIN) 300 MG capsule Take 300 mg by mouth 2 (two) times daily. (0900 & 2100)    [provider]  HYDROcodone-acetaminophen (NORCO/VICODIN) 5-325 MG tablet Take 1 tablet by mouth every 6 (six) hours as needed (pain). 09/28/17   [provider]  loratadine (CLARITIN) 10 MG tablet Take 10 mg by mouth in the morning. (0900) Patient not taking: Reported on 09/27/2022    [provider]  magnesium oxide (MAG-OX) 400 (241.3 MG) MG tablet TAKE ONE TABLET BY MOUTH ONE TIME DAILY 04/02/15   Carlena Bjornstad, MD  melatonin 3 MG TABS tablet Take 3 mg by mouth at bedtime. (2100)    [provider]  Menthol, Topical Analgesic, (BIOFREEZE COLORLESS EX) Apply 1 application topically 2 (two) times daily.    [provider]  metolazone  (ZAROXOLYN) 2.5 MG tablet Take one tablet by mouth 4 days a week.  Take 30 minutes prior to furosemide Patient taking differently: Take one tablet by mouth 4 days a week Monday, Wednesday, Friday, Sunday.  Take 30 minutes prior to furosemide 12/13/21   Jettie Booze, MD  Multiple Vitamins-Minerals (CERTA-VITE PO) Take 1 tablet by mouth daily. (0900)    [provider]  nitroGLYCERIN (NITROSTAT) 0.4 MG SL tablet Place 0.4 mg under the tongue every 5 (five) minutes as needed for chest pain (X 3 DOSES FOR CHEST PAIN).    [provider]  polyethylene glycol powder (GLYCOLAX/MIRALAX) powder Take 17 g by mouth daily. (0900)    [provider]  potassium chloride (KLOR-CON) 10 MEQ tablet Take 40 mEq by mouth 2 (two) times daily. (0900 & 2100) 11/19/19   [provider]  sitaGLIPtin (JANUVIA) 50 MG tablet Take 50 mg by mouth in the morning. (0900)    [provider]  Vitamins A & D (VITAMIN A & D) ointment Apply 1 application topically every 6 (six) hours as needed for dry skin (protection).    [provider]      VITAL SIGNS:  Blood pressure (!) 116/54, pulse 90, temperature 98.2 F (36.8 C), temperature source Oral, resp. rate 19, height 5' (1.524 m), weight 90.1 kg, SpO2 96 %.  PHYSICAL EXAMINATION:  Physical Exam  GENERAL:  89 y.o.-year-old African can female patient lying in the bed with no acute distress.  EYES: Pupils equal, round, reactive to light and accommodation. No scleral icterus. Extraocular muscles intact.  HEENT: Head atraumatic, normocephalic. Oropharynx and nasopharynx clear.  NECK:  Supple, no jugular venous distention. No thyroid enlargement, no tenderness.  LUNGS: Normal breath sounds bilaterally, no wheezing, rales,rhonchi or crepitation. No use of accessory muscles of respiration.  CARDIOVASCULAR: Regular rate and rhythm, S1, S2 normal. No murmurs, rubs, or gallops.  ABDOMEN: Soft, nondistended, nontender. Bowel  sounds present. No organomegaly or mass.  EXTREMITIES: No pedal edema, cyanosis, or clubbing.  NEUROLOGIC: Cranial nerves II through XII are intact. Muscle strength 5/5 in all extremities. Sensation intact. Gait not checked.  PSYCHIATRIC: The patient is alert and oriented x 3.  Normal affect and good eye contact. SKIN: Left big toe diffuse swelling, with induration without warmth however with mild tenderness extending to the distal foot with foot induration without warmth and black discoloration of the tip of the big toe.  LABORATORY PANEL:   CBC Recent Labs  Lab 12/22/22 2351  WBC 10.6*  HGB 13.4  HCT 39.2  PLT 315   ------------------------------------------------------------------------------------------------------------------  Chemistries  Recent Labs  Lab 12/22/22 2006 12/22/22 2351  NA 131* 132*  K 3.1* 2.9*  CL 90* 91*  CO2 26 29  GLUCOSE 119* 212*  BUN 49* 51*  CREATININE 1.05* 1.10*  CALCIUM 9.6 9.4  AST 26  --   ALT 9  --   ALKPHOS 76  --   BILITOT 0.6  --    ------------------------------------------------------------------------------------------------------------------  Cardiac Enzymes No results for input(s): "TROPONINI" in the last 168 hours. ------------------------------------------------------------------------------------------------------------------  RADIOLOGY:  DG Foot Complete Left  Result Date: 12/22/2022 CLINICAL DATA:  Concern for osteomyelitis of the great toe. EXAM: LEFT FOOT - COMPLETE 3+ VIEW COMPARISON:  None Available. FINDINGS: There is diffuse osteopenia. There is marked soft tissue swelling of the foot. There is no radiopaque foreign body. Evidence for fracture or dislocation. No cortical erosions are seen. Ulceration is seen overlying the plantar distal aspect of the first toe. There is pes planus deformity and collapse of the talus, possibly Charcot arthropathy. IMPRESSION: 1. No evidence of osteomyelitis. 2. Marked soft tissue  swelling of the foot. 3. Pes planus deformity and collapse of the talus, possibly Charcot arthropathy. Electronically Signed   By: Ronney Asters M.D.   On: 12/22/2022 15:31      IMPRESSION AND PLAN:  Assessment and Plan: * Hypokalemia - The patient will be admitted to a medical telemetry bed. - Aggressive potassium replacement will be provided. - We will follow BMP and magnesium level.  Gangrene of toe of left foot ([place]) - The patient has no current significant leukocytosis or fever. - Vascular surgery consult will be  obtained for potential need for amputation after angiography. - I notified Dr. Luan Pulling with vascular surgery and he is aware about the patient. - Order was placed for left lower extremity angiography by IR. - We will hold off on any further antibiotics at this time. - The patient has had outpatient antibiotics has no fever or any significant leukocytosis.  Hyponatremia - This is likely hypovolemic. - He will be hydrated with IV normal saline and will follow BMP.  Dehydration - This  is manifested by current azotemia. - We will hydrate with IV normal saline and follow BMP as mentioned above.  Chronic diastolic CHF (congestive heart failure) (HCC) - We are holding off diuretic therapy due to her anxiety anemia and dehydration. - We will continue Coreg and Januvia.  Essential hypertension - We will continue his antihypertensives while holding off diuretic therapy due to his dehydration.  Type 2 diabetes mellitus with peripheral neuropathy (HCC) - The patient will be placed on supplement coverage with NovoLog. - We will continue Neurontin. - We will continue Januvia.   DVT prophylaxis: Lovenox. Advanced Care Planning:  Code Status: full code. Family Communication:  The plan of care was discussed in details with the patient (and family). I answered all questions. The patient agreed to proceed with the above mentioned plan. Further management will depend upon  hospital course. Disposition Plan: Back to previous home environment Consults called: Vascular surgery. All the records are reviewed and case discussed with ED provider.  Status is: Inpatient  At the time of the admission, it appears that the appropriate admission status for this patient is inpatient.  This is judged to be reasonable and necessary in order to provide the required intensity of service to ensure the patient's safety given the presenting symptoms, physical exam findings and initial radiographic and laboratory data in the context of comorbid conditions.  The patient requires inpatient status due to high intensity of service, high risk of further deterioration and high frequency of surveillance required.  I certify that at the time of admission, it is my clinical judgment that the patient will require inpatient hospital care extending more than 2 midnights.                            Dispo: The patient is from: Home              Anticipated d/c is to: Home              Patient currently is not medically stable to d/c.              Difficult to place patient: No  Christel Mormon M.D on 12/23/2022 at 1:39 AM  [facility]   From 7 PM-7 AM, contact night-coverage [email or web address]  CC: Primary care physician; System, Provider Not In

## 2022-12-22 NOTE — ED Notes (Signed)
ED TO INPATIENT HANDOFF REPORT  ED Nurse Name and Phone #: Leatrice Parilla RN  S Name/Age/Gender [name] 89 y.o. female Room/Bed: 038C/038C  Code Status   Code Status: Full Code  Home/SNF/Other Nursing Home Patient oriented to: self, place, time, and situation Is this baseline? Yes   Triage Complete: Triage complete  Chief Complaint Hypokalemia [E87.6]  Triage Note Pt BIB [facility] from wound care clinic. Pt has a hx of cellulitis to L foot, been dealing with an issue with left great toe for months, seen for checkup today and L great toe noted to be necrotic. hx cardiomyopathy, pacemaker. EMS VS- 172/palp, HR 62, RR 20, 100% RA.    Allergies Allergies  Allergen Reactions   Aspirin Rash and Other (See Comments)    GI distress   Tramadol Other (See Comments)    GI distress   Naproxen    Shellfish-Derived Products Itching and Rash    Level of Care/Admitting Diagnosis ED Disposition     ED Disposition  Admit   Condition  --   Comment  Hospital Area: [facility] [[id number]]  Level of Care: Telemetry Medical [104]  May admit patient to [facility] or [facility] if equivalent level of care is available:: No  Covid Evaluation: Asymptomatic - no recent exposure (last 10 days) testing not required  Diagnosis: Hypokalemia [172180]  Admitting Physician: Hannah Beat [[id number]]  Attending Physician: Hannah Beat [[id number]]  Certification:: I certify this patient will need inpatient services for at least 2 midnights  Estimated Length of Stay: 2          B Medical/Surgery History Past Medical History:  Diagnosis Date   Arthritis    "qwhere"   AV block     2:1  January 14, 2011 / pacemaker plan when cellulitis resolved in length   Bradycardia    2:1  AV block   Cellulitis    Superficial cellulitis right lower leg   January 14, 2011   Chronic diastolic CHF (congestive heart failure) (HCC)    Diabetes mellitus without complication (HCC)    Edema    . EF  65-70%, echo, November 29, 2010   Ejection fraction    EF 65%, echo, January, 2012   Hypertension    Hypokalemia    October, 2012   Murmur    No significant valvular disease by echo, January, 2012   Pacemaker    January, 2013 Medtronic Sensa   Pulmonary hypertension (HCC)     46 mmHg... echo... January, 2012   Past Surgical History:  Procedure Laterality Date   CHOLECYSTECTOMY     HIP ARTHROPLASTY Right    INSERT / REPLACE / REMOVE PACEMAKER  11/2011   PARTIAL HYSTERECTOMY     vaginal   PERMANENT PACEMAKER INSERTION N/A 12/09/2011   Procedure: PERMANENT PACEMAKER INSERTION;  Surgeon: Marinus Maw, MD;  Location: [facility] CATH LAB;  Service: Cardiovascular;  Laterality: N/A;   PPM GENERATOR CHANGEOUT N/A 01/03/2022   Procedure: PPM GENERATOR CHANGEOUT;  Surgeon: Marinus Maw, MD;  Location: [facility] INVASIVE CV LAB;  Service: Cardiovascular;  Laterality: N/A;     A IV Location/Drains/Wounds Patient Lines/Drains/Airways Status     Active Line/Drains/Airways     Name Placement date Placement time Site Days   Peripheral IV 12/22/22 22 G Posterior;Right Forearm 12/22/22  2013  Forearm  less than 1            Intake/Output Last 24 hours No intake or output data  in the 24 hours ending 12/22/22 2159  Labs/Imaging Results for orders placed or performed during the hospital encounter of 12/22/22 (from the past 48 hour(s))  Comprehensive metabolic panel     Status: Abnormal   Collection Time: 12/22/22  3:34 PM  Result Value Ref Range   Sodium 131 (L) 135 - 145 mmol/L   Potassium 2.8 (L) 3.5 - 5.1 mmol/L   Chloride 88 (L) 98 - 111 mmol/L   CO2 31 22 - 32 mmol/L   Glucose, Bld 128 (H) 70 - 99 mg/dL    Comment: Glucose reference range applies only to samples taken after fasting for at least 8 hours.   BUN 50 (H) 8 - 23 mg/dL   Creatinine, Ser 1.10 (H) 0.44 - 1.00 mg/dL   Calcium 9.8 8.9 - 10.3 mg/dL   Total Protein 8.2 (H) 6.5 - 8.1 g/dL   Albumin 3.5 3.5 - 5.0 g/dL   AST 27 15 - 41  U/L   ALT 10 0 - 44 U/L   Alkaline Phosphatase 87 38 - 126 U/L   Total Bilirubin 0.5 0.3 - 1.2 mg/dL   GFR, Estimated 48 (L) >60 mL/min    Comment: (NOTE) Calculated using the CKD-EPI Creatinine Equation (2021)    Anion gap 12 5 - 15    Comment: Performed at [facility] [address] [place], [place] [address]  CBC with Differential     Status: Abnormal   Collection Time: 12/22/22  3:34 PM  Result Value Ref Range   WBC 10.6 (H) 4.0 - 10.5 K/uL   RBC 4.71 3.87 - 5.11 MIL/uL   Hemoglobin 13.5 12.0 - 15.0 g/dL   HCT 41.8 36.0 - 46.0 %   MCV 88.7 80.0 - 100.0 fL   MCH 28.7 26.0 - 34.0 pg   MCHC 32.3 30.0 - 36.0 g/dL   RDW 13.2 11.5 - 15.5 %   Platelets 342 150 - 400 K/uL   nRBC 0.0 0.0 - 0.2 %   Neutrophils Relative % 77 %   Neutro Abs 8.2 (H) 1.7 - 7.7 K/uL   Lymphocytes Relative 11 %   Lymphs Abs 1.2 0.7 - 4.0 K/uL   Monocytes Relative 8 %   Monocytes Absolute 0.8 0.1 - 1.0 K/uL   Eosinophils Relative 3 %   Eosinophils Absolute 0.3 0.0 - 0.5 K/uL   Basophils Relative 0 %   Basophils Absolute 0.0 0.0 - 0.1 K/uL   Immature Granulocytes 1 %   Abs Immature Granulocytes 0.05 0.00 - 0.07 K/uL    Comment: Performed at [facility] [address] [place], [place] [address]  C-reactive protein     Status: Abnormal   Collection Time: 12/22/22  3:34 PM  Result Value Ref Range   CRP 2.7 (H) <1.0 mg/dL    Comment: Performed at [facility], [place] [address], [place],  [address]  Comprehensive metabolic panel     Status: Abnormal   Collection Time: 12/22/22  8:06 PM  Result Value Ref Range   Sodium 131 (L) 135 - 145 mmol/L   Potassium 3.1 (L) 3.5 - 5.1 mmol/L   Chloride 90 (L) 98 - 111 mmol/L   CO2 26 22 - 32 mmol/L   Glucose, Bld 119 (H) 70 - 99 mg/dL    Comment: Glucose reference range applies only to samples taken after fasting for at least 8 hours.   BUN 49 (H) 8 - 23 mg/dL   Creatinine, Ser 1.05 (  H) 0.44 - 1.00 mg/dL   Calcium 9.6 8.9 - 10.3 mg/dL    Total Protein 7.7 6.5 - 8.1 g/dL   Albumin 3.3 (L) 3.5 - 5.0 g/dL   AST 26 15 - 41 U/L   ALT 9 0 - 44 U/L   Alkaline Phosphatase 76 38 - 126 U/L   Total Bilirubin 0.6 0.3 - 1.2 mg/dL   GFR, Estimated 51 (L) >60 mL/min    Comment: (NOTE) Calculated using the CKD-EPI Creatinine Equation (2021)    Anion gap 15 5 - 15    Comment: Performed at [facility] [address] [place], [place] [address]  CBG monitoring, ED     Status: Abnormal   Collection Time: 12/22/22  8:17 PM  Result Value Ref Range   Glucose-Capillary 126 (H) 70 - 99 mg/dL    Comment: Glucose reference range applies only to samples taken after fasting for at least 8 hours.   DG Foot Complete Left  Result Date: 12/22/2022 CLINICAL DATA:  Concern for osteomyelitis of the great toe. EXAM: LEFT FOOT - COMPLETE 3+ VIEW COMPARISON:  None Available. FINDINGS: There is diffuse osteopenia. There is marked soft tissue swelling of the foot. There is no radiopaque foreign body. Evidence for fracture or dislocation. No cortical erosions are seen. Ulceration is seen overlying the plantar distal aspect of the first toe. There is pes planus deformity and collapse of the talus, possibly Charcot arthropathy. IMPRESSION: 1. No evidence of osteomyelitis. 2. Marked soft tissue swelling of the foot. 3. Pes planus deformity and collapse of the talus, possibly Charcot arthropathy. Electronically Signed   By: Ronney Asters M.D.   On: 12/22/2022 15:31    Pending Labs Unresulted Labs (From admission, onward)     Start     Ordered   12/23/22 7371  Basic metabolic panel  Tomorrow morning,   R        12/22/22 2021   12/23/22 0500  CBC  Tomorrow morning,   R        12/22/22 2021   12/22/22 1645  Sedimentation rate  Once,   STAT        12/22/22 1645            Vitals/Pain Today's Vitals   12/22/22 1515 12/22/22 1730 12/22/22 1857 12/22/22 2120  BP: (!) 168/80 115/73  (!) 125/53  Pulse: 79 83  90  Resp: 19 20  (!) 21  Temp:   98.1 F  (36.7 C)   TempSrc:   Oral   SpO2: 98% 96%  97%  Weight:      Height:      PainSc:        Isolation Precautions No active isolations  Medications Medications  potassium chloride 10 mEq in 100 mL IVPB (10 mEq Intravenous New Bag/Given 12/22/22 2138)  magnesium sulfate IVPB 2 g 50 mL (2 g Intravenous New Bag/Given 12/22/22 2136)  HYDROcodone-acetaminophen (NORCO/VICODIN) 5-325 MG per tablet 1 tablet (has no administration in time range)  carvedilol (COREG) tablet 6.25 mg (has no administration in time range)  nitroGLYCERIN (NITROSTAT) SL tablet 0.4 mg (has no administration in time range)  linagliptin (TRADJENTA) tablet 5 mg (has no administration in time range)  polyethylene glycol powder (GLYCOLAX/MIRALAX) container 17 g (has no administration in time range)  melatonin tablet 3 mg (has no administration in time range)  gabapentin (NEURONTIN) capsule 300 mg (has no administration in time range)  Biotin CAPS 5,000 mcg (has no administration in time  range)  Vitamin D CAPS 2,000 Units (has no administration in time range)  magnesium oxide (MAG-OX) tablet 400 mg (has no administration in time range)  potassium chloride (KLOR-CON) CR tablet 40 mEq (has no administration in time range)  vitamin A & D ointment 1 Application (has no administration in time range)  enoxaparin (LOVENOX) injection 40 mg (40 mg Subcutaneous Given 12/22/22 2142)  0.9 % NaCl with KCl 20 mEq/ L  infusion (has no administration in time range)  acetaminophen (TYLENOL) tablet 650 mg (has no administration in time range)    Or  acetaminophen (TYLENOL) suppository 650 mg (has no administration in time range)  traZODone (DESYREL) tablet 25 mg (has no administration in time range)  magnesium hydroxide (MILK OF MAGNESIA) suspension 30 mL (has no administration in time range)  ondansetron (ZOFRAN) tablet 4 mg (has no administration in time range)    Or  ondansetron (ZOFRAN) injection 4 mg (has no administration in time range)   potassium chloride SA (KLOR-CON M) CR tablet 20 mEq (20 mEq Oral Given 12/22/22 2126)  sodium chloride 0.9 % bolus 500 mL (500 mLs Intravenous New Bag/Given 12/22/22 2127)    Mobility non-ambulatory     Focused Assessments Cardiac Assessment Handoff:    Lab Results  Component Value Date   CKTOTAL 92 04/30/2015   TROPONINI 0.04 (H) 08/13/2015   No results found for: "DDIMER" Does the Patient currently have chest pain? No   , Pulmonary Assessment Handoff:  Lung sounds:   O2 Device: Room Air      R Recommendations: See Admitting Provider Note  Report given to:   Additional Notes:

## 2022-12-22 NOTE — Assessment & Plan Note (Signed)
-  We are holding off diuretic therapy due to her anxiety anemia and dehydration. - We will continue Coreg and Januvia.

## 2022-12-22 NOTE — ED Triage Notes (Signed)
Pt BIB [facility] from wound care clinic. Pt has a hx of cellulitis to L foot, been dealing with an issue with left great toe for months, seen for checkup today and L great toe noted to be necrotic. hx cardiomyopathy, pacemaker. EMS VS- 172/palp, HR 62, RR 20, 100% RA.

## 2022-12-22 NOTE — Assessment & Plan Note (Signed)
-  We will continue Neurontin. ?

## 2022-12-22 NOTE — Progress Notes (Signed)
Discussed with ER staff. Patient with plans to undergo angiography electively has suffered deterioration of left great toe. Please keep NPO. Plan angiogram tomorrow AM.  [name]. Stanford Breed, MD [facility] Phone Number: [phone] 12/22/2022 7:42 PM

## 2022-12-22 NOTE — Progress Notes (Addendum)
JULITA, OZBUN ([id number]) [id number] Page 1 of 7 Visit Report for 12/22/2022 Chief Complaint Document Details Patient Name: Date of Service: [name], [name] 12/22/2022 12:45 PM Medical Record Number: [id number] Patient Account Number: [id number] Date of Birth/Sex: Treating RN: 1930-12-02 (89 y.o. F) Primary Care Provider: SYSTEM, PRO V IDER Other Clinician: Referring Provider: Treating Provider/Extender: Jadene Pierini in Treatment: 46 Information Obtained from: Patient Chief Complaint Patient presents to the wound care center with open non-healing surgical wound(s) Electronic Signature(s) Signed: 12/22/2022 1:59:38 PM By: Fredirick Maudlin MD FACS Entered By: Fredirick Maudlin on 12/22/2022 13:59:38 -------------------------------------------------------------------------------- HPI Details Patient Name: Date of Service: [name]. 12/22/2022 12:45 PM Medical Record Number: [id number] Patient Account Number: [id number] Date of Birth/Sex: Treating RN: 1930/12/16 (89 y.o. F) Primary Care Provider: SYSTEM, PRO V IDER Other Clinician: Referring Provider: Treating Provider/Extender: Jadene Pierini in Treatment: 46 History of Present Illness HPI Description: ADMISSION 02/01/2022 This is an 89 year old woman who is a resident of [facility]. She has a past medical history notable for type 2 diabetes mellitus, congestive heart failure, pulmonary hypertension, and complete heart block. She is accompanied today by her son. Approximately 4 months ago, she had an ingrown toe nail removed. Apparently this went a bit deep. Since that time, it has not healed. The patient is nonambulatory. Apparently they have been applying Santyl to the site at her facility. She was on antibiotics but I am not certain which specific drugs she took. She completed her last course a few weeks ago. We were unable to obtain ABIs in clinic secondary to the patient's body  habitus. She has not had any formal vascular studies nor any x-ray of the area. 02/09/2022: The wound is roughly the same size today, but the amount of slough in the base has decreased significantly. She apparently had an x-ray done at her facility, but we do not have the report; per verbal report from facility staff, "there was nothing there." We had requested formal ABI/TBI studies, but these were not done. She has been in [place] under [employer]. 02/16/2022: The wound measured about the same today, but to my eye, it appears rather smaller. The slough in the base is minimal. Her vascular studies are scheduled for Friday. We have been using Santyl under Hydrofera Blue. No concern for infection. 02/23/2022: Once again, the wound has been measured about the same size, but I think it looks a bit smaller. There is slough at the base. She had her ABIs done last week and these show severe bilateral peripheral arterial disease. We received the report of the foot x-ray that was ordered. There is no concern for osteomyelitis at this time. 03/02/2022: The wound is smaller today with a small amount of slough at the base. We placed a referral to vein and vascular surgery last week in response to her severe peripheral arterial disease, but she does not yet have an appointment on the books. 03/16/2022: She saw Dr. Stanford Breed in the vascular surgery department yesterday. He did not think she was a good candidate for any revascularization options and suggested that she continue local wound care for another month. If she fails to heal in that time, they may consider an angiogram with potential intervention. The wound today is roughly the same size but there is less exposed bone at the base and the surface is clean without any substantial slough. 03/24/2022: The wound appears about the same. There is some periwound callus that has heaped up. We changed  her to Prisma silver collagen last week. 04/06/2022: No significant change  to the wound. It is neither improving nor deteriorating. She continues to accumulate some periwound callus around the margin. Bone is exposed at the base. [name], [name] ([id number]) [id number] Page 2 of 7 04/20/2022: The wound has improved quite a bit over the past 2 weeks. It is smaller and there is no real accumulation of periwound callus. She had follow-up with Dr. Stanford Breed yesterday and he felt that the wound was progressing well enough that no arterial intervention was necessary. 05/18/2022: The patient has been absent due to a fall from her wheelchair during her last transportation that was supposed to be to her clinic. Today, the wound is about the same size. There is still bone exposed at the base. There is periwound callus accumulation. No concern for infection. 06/01/2022: The wound is actually a little bit smaller today and the surface is not as pale. She still has accumulated periwound callus. 06/15/2022: The wound continues to contract, albeit slowly. No concern for infection. 06/28/2022: The wound is slightly smaller again today. She continues to accumulate callus and there is some periwound epibole at the margins. No concern for gross infection. 07/11/2022: The wound is roughly the same size with just a small opening in the center. She has been approved for Grafix. 07/18/2022: The wound has contracted somewhat and the surface has a healthier pink look to it. We applied Grafix last week and she is here for application #2. 12/24/7626: The wound is about the same size but the surface continues to improve and appear more vital. She is here for Grafix application #3. 02/03/1760: There is still a stubborn central portion of her toe wound that is not closing in yet. The bone remains exposed. She is here for Grafix application #4. 04/27/3709: Today when I debrided the eschar and callus from her wound, purulent drainage was expressed from her wound. The underlying surface  is unchanged with bone exposed. She has been having more pain in the toe and this may explain why. 08/18/2022: The culture that I took last week grew out Candida and Acinetobacter. We changed her dressing regimen to terbinafine instead of gentamicin and I prescribed levofloxacin instead of Augmentin. T oday, the wound does not look that much different. It remains small with granulation tissue at the base, but bone remains exposed. 09/01/2022: The wound is about the same. I had debrided a fair amount of periwound callus and nonviable skin last week, so it does measure a little bit larger. The bone is still exposed. No obvious signs of infection. I requested a plain x-ray of the patient's foot a month ago and still have not received the results of that study, performed at her nursing facility. She continues to complain of pain in her toe and I am concerned that she has osteomyelitis. Based upon her previous ABIs, she does not have adequate blood flow to heal an amputation and she has failed to make much progress in terms of her wound healing. 09/15/2022: I did receive the results of the x-ray, but no actual images. No osteomyelitis was identified on the film. We referred her back to vascular surgery, but she does not yet have an appointment. The wound is unchanged. 09/29/2022: She was seen by Dr. Stanford Breed on November 7. He has offered her an angiogram with intervention if feasible. She is not sure if she is comfortable with going forward. Her wound is completely unchanged. 10/12/2022: The wound is unchanged. It sounds  like there has been a fair amount of discussion between the patient, her daughter, Dr. Lenell Antu, and cardiology. It honestly sounds like the patient is not interested in any sort of vascular intervention. She is concerned about the possibility of stent placement, as well as the need for dye and its potential effect on her kidneys. I told the patient and her daughter, who was present via speaker  phone, without better blood flow, I did not think this wound had a chance of healing. 11/09/2022: 1 month follow-up. Ultimately, the patient elected not to undergo any arteriography and intervention. T oday, she presents with the tip of her toe necrotic. There is no purulent drainage or malodor, but the tip of her toe is black. The wound is larger and there is dead skin peeling away from the wound opening. 11/24/2022: The tip of her toe demonstrates dry gangrene. They have been painting it with Betadine at her care facility. No overt signs of infection. 12/22/2022: The toe is necrotic and requires amputation. Electronic Signature(s) Signed: 12/22/2022 2:00:04 PM By: Duanne Guess MD FACS Entered By: Duanne Guess on 12/22/2022 14:00:04 -------------------------------------------------------------------------------- Physical Exam Details Patient Name: Date of Service: [name] 12/22/2022 12:45 PM Medical Record Number: [id number] Patient Account Number: [id number] Date of Birth/Sex: Treating RN: [birth year] (89 y.o. F) Primary Care Provider: SYSTEM, PRO V IDER Other Clinician: Referring Provider: Treating Provider/Extender: [name] in Treatment: 46 Constitutional Hypertensive, asymptomatic. . . . no acute distress. Respiratory Normal work of breathing on room air. Notes 12/22/2022: The toe was necrotic and when palpated, frank pus oozed out. [name], [name] ([id number]) [id number] Page 3 of 7 Electronic Signature(s) Signed: 12/22/2022 2:00:45 PM By: Duanne Guess MD FACS Entered By: Duanne Guess on 12/22/2022 14:00:45 -------------------------------------------------------------------------------- Physician Orders Details Patient Name: Date of Service: [name]. 12/22/2022 12:45 PM Medical Record Number: [id number] Patient Account Number: [id number] Date of Birth/Sex: Treating RN: 26-May-1931 (89 y.o. [name] Primary Care Provider: SYSTEM, PRO V IDER Other Clinician: Referring Provider: Treating Provider/Extender: [name] in Treatment: 74 Verbal / Phone Orders: No Diagnosis Coding ICD-10 Coding Code Description L97.522 Non-pressure chronic ulcer of other part of left foot with fat layer exposed E11.621 Type 2 diabetes mellitus with foot ulcer I50.32 Chronic diastolic (congestive) heart failure I27.20 Pulmonary hypertension, unspecified Follow-up Appointments Other: - Dr. Lady Gary requests patient +++++goes to [facility] for possible Left Great toe amputation.+++++++ Wound Treatment Electronic Signature(s) Signed: 12/22/2022 2:01:34 PM By: Duanne Guess MD FACS Entered By: Duanne Guess on 12/22/2022 14:01:34 -------------------------------------------------------------------------------- Problem List Details Patient Name: Date of Service: [name]. 12/22/2022 12:45 PM Medical Record Number: [id number] Patient Account Number: [id number] Date of Birth/Sex: Treating RN: Jun 20, 1931 (89 y.o. F) Primary Care Provider: SYSTEM, PRO V IDER Other Clinician: Referring Provider: Treating Provider/Extender: [name] in Treatment: 46 Active Problems ICD-10 Encounter Code Description Active Date MDM Diagnosis L97.522 Non-pressure chronic ulcer of other part of left foot with fat layer exposed 02/01/2022 No Yes E11.621 Type 2 diabetes mellitus with foot ulcer 02/01/2022 No Yes I50.32 Chronic diastolic (congestive) heart failure 02/01/2022 No Yes [name], [name] ([id number]) [id number] Page 4 of 7 I27.20 Pulmonary hypertension, unspecified 02/01/2022 No Yes Inactive Problems Resolved Problems Electronic Signature(s) Signed: 12/22/2022 1:58:17 PM By: Duanne Guess MD FACS Entered By: Duanne Guess on 12/22/2022 13:58:17 -------------------------------------------------------------------------------- Progress Note  Details Patient Name: Date of Service: [name]. 12/22/2022 12:45 PM Medical Record Number: [id number] Patient Account Number: [id number]  Date of Birth/Sex: Treating RN: 09-Sep-1931 (89 y.o. F) Primary Care Provider: SYSTEM, PRO V IDER Other Clinician: Referring Provider: Treating Provider/Extender: [name] in Treatment: 46 Subjective Chief Complaint Information obtained from Patient Patient presents to the wound care center with open non-healing surgical wound(s) History of Present Illness (HPI) ADMISSION 02/01/2022 This is an 89 year old woman who is a resident of [facility]. She has a past medical history notable for type 2 diabetes mellitus, congestive heart failure, pulmonary hypertension, and complete heart block. She is accompanied today by her son. Approximately 4 months ago, she had an ingrown toe nail removed. Apparently this went a bit deep. Since that time, it has not healed. The patient is nonambulatory. Apparently they have been applying Santyl to the site at her facility. She was on antibiotics but I am not certain which specific drugs she took. She completed her last course a few weeks ago. We were unable to obtain ABIs in clinic secondary to the patient's body habitus. She has not had any formal vascular studies nor any x-ray of the area. 02/09/2022: The wound is roughly the same size today, but the amount of slough in the base has decreased significantly. She apparently had an x-ray done at her facility, but we do not have the report; per verbal report from facility staff, "there was nothing there." We had requested formal ABI/TBI studies, but these were not done. She has been in [place] under [employer]	[place]. 02/16/2022: The wound measured about the same today, but to my eye, it appears rather smaller. The slough in the base is minimal. Her vascular studies are scheduled for Friday. We have been using Santyl under [employer]. No concern for  infection. 02/23/2022: Once again, the wound has been measured about the same size, but I think it looks a bit smaller. There is slough at the base. She had her ABIs done last week and these show severe bilateral peripheral arterial disease. We received the report of the foot x-ray that was ordered. There is no concern for osteomyelitis at this time. 03/02/2022: The wound is smaller today with a small amount of slough at the base. We placed a referral to vein and vascular surgery last week in response to her severe peripheral arterial disease, but she does not yet have an appointment on the books. 03/16/2022: She saw Dr. Lenell Antu in the vascular surgery department yesterday. He did not think she was a good candidate for any revascularization options and suggested that she continue local wound care for another month. If she fails to heal in that time, they may consider an angiogram with potential intervention. The wound today is roughly the same size but there is less exposed bone at the base and the surface is clean without any substantial slough. 03/24/2022: The wound appears about the same. There is some periwound callus that has heaped up. We changed her to Prisma silver collagen last week. 04/06/2022: No significant change to the wound. It is neither improving nor deteriorating. She continues to accumulate some periwound callus around the margin. Bone is exposed at the base. 04/20/2022: The wound has improved quite a bit over the past 2 weeks. It is smaller and there is no real accumulation of periwound callus. She had follow-up with Dr. Lenell Antu yesterday and he felt that the wound was progressing well enough that no arterial intervention was necessary. 05/18/2022: The patient has been absent due to a fall from her wheelchair during her last transportation that was supposed to be  to her clinic. Today, the wound is about the same size. There is still bone exposed at the base. There is periwound callus  accumulation. No concern for infection. 06/01/2022: The wound is actually a little bit smaller today and the surface is not as pale. She still has accumulated periwound callus. 06/15/2022: The wound continues to contract, albeit slowly. No concern for infection. 06/28/2022: The wound is slightly smaller again today. She continues to accumulate callus and there is some periwound epibole at the margins. No concern for [name], [name] ([id number]) [id number] Page 5 of 7 gross infection. 07/11/2022: The wound is roughly the same size with just a small opening in the center. She has been approved for Grafix. 07/18/2022: The wound has contracted somewhat and the surface has a healthier pink look to it. We applied Grafix last week and she is here for application #2. 07/28/2022: The wound is about the same size but the surface continues to improve and appear more vital. She is here for Grafix application #3. 08/04/2022: There is still a stubborn central portion of her toe wound that is not closing in yet. The bone remains exposed. She is here for Grafix application #4. 08/11/2022: Today when I debrided the eschar and callus from her wound, purulent drainage was expressed from her wound. The underlying surface is unchanged with bone exposed. She has been having more pain in the toe and this may explain why. 08/18/2022: The culture that I took last week grew out Candida and Acinetobacter. We changed her dressing regimen to terbinafine instead of gentamicin and I prescribed levofloxacin instead of Augmentin. T oday, the wound does not look that much different. It remains small with granulation tissue at the base, but bone remains exposed. 09/01/2022: The wound is about the same. I had debrided a fair amount of periwound callus and nonviable skin last week, so it does measure a little bit larger. The bone is still exposed. No obvious signs of infection. I requested a plain x-ray of the  patient's foot a month ago and still have not received the results of that study, performed at her nursing facility. She continues to complain of pain in her toe and I am concerned that she has osteomyelitis. Based upon her previous ABIs, she does not have adequate blood flow to heal an amputation and she has failed to make much progress in terms of her wound healing. 09/15/2022: I did receive the results of the x-ray, but no actual images. No osteomyelitis was identified on the film. We referred her back to vascular surgery, but she does not yet have an appointment. The wound is unchanged. 09/29/2022: She was seen by Dr. Lenell Antu on November 7. He has offered her an angiogram with intervention if feasible. She is not sure if she is comfortable with going forward. Her wound is completely unchanged. 10/12/2022: The wound is unchanged. It sounds like there has been a fair amount of discussion between the patient, her daughter, Dr. Lenell Antu, and cardiology. It honestly sounds like the patient is not interested in any sort of vascular intervention. She is concerned about the possibility of stent placement, as well as the need for dye and its potential effect on her kidneys. I told the patient and her daughter, who was present via speaker phone, without better blood flow, I did not think this wound had a chance of healing. 11/09/2022: 1 month follow-up. Ultimately, the patient elected not to undergo any arteriography and intervention. T oday, she presents with the tip  of her toe necrotic. There is no purulent drainage or malodor, but the tip of her toe is black. The wound is larger and there is dead skin peeling away from the wound opening. 11/24/2022: The tip of her toe demonstrates dry gangrene. They have been painting it with Betadine at her care facility. No overt signs of infection. 12/22/2022: The toe is necrotic and requires amputation. Patient History Social History Never smoker, Marital Status -  Widowed, Alcohol Use - Never, Drug Use - No History, Caffeine Use - Rarely. Medical History Cardiovascular Patient has history of Arrhythmia - AV block, complete heart block, Congestive Heart Failure, Hypertension Endocrine Patient has history of Type II Diabetes Musculoskeletal Patient has history of Gout, Osteoarthritis Medical A Surgical History Notes nd Cardiovascular Pulmonary Hypertension Endocrine Hypoglycemia Neurologic Acute encephalopathy Objective Constitutional Hypertensive, asymptomatic. no acute distress. Vitals Time Taken: 12:48 PM, Height: 61 in, Weight: 203 lbs, BMI: 38.4, Temperature: 98.4 F, Pulse: 89 bpm, Respiratory Rate: 18 breaths/min, Blood Pressure: 155/85 mmHg. Respiratory Normal work of breathing on room air. General Notes: 12/22/2022: The toe was necrotic and when palpated, frank pus oozed out. Integumentary (Hair, Skin) Wound #1 status is Open. Original cause of wound was Gradually Appeared. The date acquired was: 09/29/2021. The wound has been in treatment 46 weeks. The wound is located on the Left T Great. The wound measures 4cm length x 2.7cm width x 0.8cm depth; 8.482cm^2 area and 6.786cm^3 volume. There is Fat oe Layer (Subcutaneous Tissue) exposed. There is no tunneling or undermining noted. There is a small amount of serous drainage noted. The wound margin is flat [name], [name] ([id number]) [id number] Page 6 of 7 and intact. There is no granulation within the wound bed. There is a large (67-100%) amount of necrotic tissue within the wound bed including Eschar and Adherent Slough. The periwound skin appearance had no abnormalities noted for color. The periwound skin appearance exhibited: Scarring, Maceration. The periwound skin appearance did not exhibit: Induration. Periwound temperature was noted as No Abnormality. Assessment Active Problems ICD-10 Non-pressure chronic ulcer of other part of left foot with fat  layer exposed Type 2 diabetes mellitus with foot ulcer Chronic diastolic (congestive) heart failure Pulmonary hypertension, unspecified Plan Follow-up Appointments: Other: - Dr. Celine Ahr requests patient +++++goes to [facility] for possible Left Great toe amputation.+++++++ 12/22/2022: The toe is necrotic and when palpated, frank pus oozed out. Unfortunately, I believe she requires amputation. This was discussed with the patient and her son who accompanied her to clinic today. I have recommended that she be transferred to [facility] [place], due to her cardiac conditions, to be evaluated for said surgery. At the time of this documentation, EMS is here to facilitate that transfer. Once the toe has been amputated, she should not require our services further, but if they are needed at a future date, she is welcome to return at any time. Electronic Signature(s) Signed: 12/22/2022 2:03:05 PM By: Fredirick Maudlin MD FACS Entered By: Fredirick Maudlin on 12/22/2022 14:03:04 -------------------------------------------------------------------------------- HxROS Details Patient Name: Date of Service: [name], [name]. 12/22/2022 12:45 PM Medical Record Number: [id number] Patient Account Number: [id number] Date of Birth/Sex: Treating RN: 07-15-1931 (89 y.o. F) Primary Care Provider: SYSTEM, PRO V IDER Other Clinician: Referring Provider: Treating Provider/Extender: Jadene Pierini in Treatment: 46 Cardiovascular Medical History: Positive for: Arrhythmia - AV block, complete heart block; Congestive Heart Failure; Hypertension Past Medical History Notes: Pulmonary Hypertension Endocrine Medical History: Positive for: Type II Diabetes Past Medical History Notes: Hypoglycemia  Treated with: Oral agents Blood sugar tested every day: Yes Tested : 2-3x a day Musculoskeletal Medical History: Positive for: Gout; Osteoarthritis Neurologic [name], [name] ([id number])  [id number] Page 7 of 7 Medical History: Past Medical History Notes: Acute encephalopathy Immunizations Pneumococcal Vaccine: Received Pneumococcal Vaccination: No Implantable Devices No devices added Family and Social History Never smoker; Marital Status - Widowed; Alcohol Use: Never; Drug Use: No History; Caffeine Use: Rarely; Financial Concerns: No; Food, Clothing or Shelter Needs: No; Support System Lacking: No; Transportation Concerns: No Electronic Signature(s) Signed: 12/22/2022 2:29:45 PM By: Duanne Guess MD FACS Entered By: Duanne Guess on 12/22/2022 14:00:09 -------------------------------------------------------------------------------- SuperBill Details Patient Name: Date of Service: [name] 12/22/2022 Medical Record Number: [id number] Patient Account Number: [id number] Date of Birth/Sex: Treating RN: 30-Aug-1931 (89 y.o. F) Primary Care Provider: SYSTEM, PRO V IDER Other Clinician: Referring Provider: Treating Provider/Extender: [name] in Treatment: 46 Diagnosis Coding ICD-10 Codes Code Description L97.522 Non-pressure chronic ulcer of other part of left foot with fat layer exposed E11.621 Type 2 diabetes mellitus with foot ulcer I50.32 Chronic diastolic (congestive) heart failure I27.20 Pulmonary hypertension, unspecified Facility Procedures : CPT4 Code: [id number] Description: 99214 - WOUND CARE VISIT-LEV 4 EST PT Modifier: Quantity: 1 Physician Procedures : CPT4 Code Description Modifier [id number] - WC PHYS LEVEL 3 - EST PT ICD-10 Diagnosis Description L97.522 Non-pressure chronic ulcer of other part of left foot with fat layer exposed E11.621 Type 2 diabetes mellitus with foot ulcer I50.32 Chronic  diastolic (congestive) heart failure I27.20 Pulmonary hypertension, unspecified Quantity: 1 Electronic Signature(s) Signed: 12/22/2022 4:55:54 PM By: Karie Schwalbe RN Signed: 12/23/2022 9:43:37 AM By:  Duanne Guess MD FACS Previous Signature: 12/22/2022 2:03:32 PM Version By: Duanne Guess MD FACS Entered By: Karie Schwalbe on 12/22/2022 16:19:30

## 2022-12-22 NOTE — Assessment & Plan Note (Signed)
-  We will continue his antihypertensives while holding off diuretic therapy due to his dehydration.

## 2022-12-22 NOTE — ED Notes (Signed)
Consult paged to vascular by Hassan Buckler

## 2022-12-22 NOTE — Assessment & Plan Note (Addendum)
-   The patient has no current significant leukocytosis or fever. - Vascular surgery consult will be obtained for potential need for amputation after angiography. - I notified Dr. Luan Pulling with vascular surgery and he is aware about the patient. - Order was placed for left lower extremity angiography by IR. - We will hold off on any further antibiotics at this time. - The patient has had outpatient antibiotics has no fever or any significant leukocytosis.

## 2022-12-22 NOTE — Assessment & Plan Note (Signed)
-  The patient will be admitted to a medical telemetry bed. - Aggressive potassium replacement will be provided. - We will follow BMP and magnesium level.

## 2022-12-22 NOTE — ED Provider Notes (Signed)
[facility] Provider Note   CSN: [id number] Arrival date & time: 12/22/22  1422     History  Chief Complaint  Patient presents with   Toe Pain    [name] is a 89 y.o. female.  [name] is an 89 year old female with extensive past medical history of peripheral arterial disease and chronic wound to her right great toe in the setting of poor peripheral vascularity as well as toe injury that occurred about a year ago after she had her toenails trimmed.  She previously followed with wound care.  She was supposedly at her wound care doctor's office today or yesterday for a routine visit wound care doctor was concerned for worsening infection so she presents to the emergency department for further management.    Toe Pain       Home Medications Prior to Admission medications   Medication Sig Start Date End Date Taking? Authorizing Provider  Biotin 5000 MCG CAPS Take 5,000 mcg by mouth daily. (0900)    [provider]  carvedilol (COREG) 6.25 MG tablet Take 6.25 mg by mouth 2 (two) times daily with a meal. (0900 & 1700)    [provider]  Cholecalciferol (VITAMIN D) 50 MCG (2000 UT) CAPS Take 2,000 Units by mouth in the morning. (0900)    [provider]  furosemide (LASIX) 80 MG tablet Take 80 mg by mouth in the morning. (0900) 09/19/17   [provider]  gabapentin (NEURONTIN) 300 MG capsule Take 300 mg by mouth 2 (two) times daily. (0900 & 2100)    [provider]  HYDROcodone-acetaminophen (NORCO/VICODIN) 5-325 MG tablet Take 1 tablet by mouth every 6 (six) hours as needed (pain). 09/28/17   [provider]  loratadine (CLARITIN) 10 MG tablet Take 10 mg by mouth in the morning. (0900) Patient not taking: Reported on 09/27/2022    [provider]  magnesium oxide (MAG-OX) 400 (241.3 MG) MG tablet TAKE ONE TABLET BY MOUTH ONE TIME DAILY 04/02/15   Carlena Bjornstad, MD   melatonin 3 MG TABS tablet Take 3 mg by mouth at bedtime. (2100)    [provider]  Menthol, Topical Analgesic, (BIOFREEZE COLORLESS EX) Apply 1 application topically 2 (two) times daily.    [provider]  metolazone (ZAROXOLYN) 2.5 MG tablet Take one tablet by mouth 4 days a week.  Take 30 minutes prior to furosemide Patient taking differently: Take one tablet by mouth 4 days a week Monday, Wednesday, Friday, Sunday.  Take 30 minutes prior to furosemide 12/13/21   Jettie Booze, MD  Multiple Vitamins-Minerals (CERTA-VITE PO) Take 1 tablet by mouth daily. (0900)    [provider]  nitroGLYCERIN (NITROSTAT) 0.4 MG SL tablet Place 0.4 mg under the tongue every 5 (five) minutes as needed for chest pain (X 3 DOSES FOR CHEST PAIN).    [provider]  polyethylene glycol powder (GLYCOLAX/MIRALAX) powder Take 17 g by mouth daily. (0900)    [provider]  potassium chloride (KLOR-CON) 10 MEQ tablet Take 40 mEq by mouth 2 (two) times daily. (0900 & 2100) 11/19/19   [provider]  sitaGLIPtin (JANUVIA) 50 MG tablet Take 50 mg by mouth in the morning. (0900)    [provider]  Vitamins A & D (VITAMIN A & D) ointment Apply 1 application topically every 6 (six) hours as needed for dry skin (protection).    [provider]  Allergies    Aspirin, Tramadol, Naproxen, and Shellfish-derived products    Review of Systems   Review of Systems  Physical Exam Updated Vital Signs BP (!) 116/54 (BP Location: Right Arm)   Pulse 90   Temp 98.2 F (36.8 C) (Oral)   Resp 19   Ht 5' (1.524 m)   Wt 90.1 kg   SpO2 96%   BMI 38.79 kg/m  Physical Exam Vitals and nursing note reviewed.  Constitutional:      General: She is not in acute distress.    Appearance: She is well-developed.  HENT:     Head: Normocephalic and atraumatic.     Mouth/Throat:     Mouth: Mucous membranes are moist.  Eyes:     Conjunctiva/sclera:  Conjunctivae normal.     Comments: strabismus  Cardiovascular:     Rate and Rhythm: Normal rate and regular rhythm.     Heart sounds: No murmur heard.    Comments: Diminished pulses in the BLE, dopplerable, monophasic on Left lower extremity  Pulmonary:     Effort: Pulmonary effort is normal. No respiratory distress.     Breath sounds: Normal breath sounds.  Abdominal:     Palpations: Abdomen is soft.     Tenderness: There is no abdominal tenderness.  Musculoskeletal:        General: No swelling.     Cervical back: Neck supple.     Right lower leg: No edema.     Left lower leg: No edema.  Skin:    General: Skin is warm and dry.     Capillary Refill: Capillary refill takes less than 2 seconds.  Neurological:     General: No focal deficit present.     Mental Status: She is alert.  Psychiatric:        Mood and Affect: Mood normal.     ED Results / Procedures / Treatments   Labs (all labs ordered are listed, but only abnormal results are displayed) Labs Reviewed  COMPREHENSIVE METABOLIC PANEL - Abnormal; Notable for the following components:      Result Value   Sodium 131 (*)    Potassium 2.8 (*)    Chloride 88 (*)    Glucose, Bld 128 (*)    BUN 50 (*)    Creatinine, Ser 1.10 (*)    Total Protein 8.2 (*)    GFR, Estimated 48 (*)    All other components within normal limits  CBC WITH DIFFERENTIAL/PLATELET - Abnormal; Notable for the following components:   WBC 10.6 (*)    Neutro Abs 8.2 (*)    All other components within normal limits  C-REACTIVE PROTEIN - Abnormal; Notable for the following components:   CRP 2.7 (*)    All other components within normal limits  COMPREHENSIVE METABOLIC PANEL - Abnormal; Notable for the following components:   Sodium 131 (*)    Potassium 3.1 (*)    Chloride 90 (*)    Glucose, Bld 119 (*)    BUN 49 (*)    Creatinine, Ser 1.05 (*)    Albumin 3.3 (*)    GFR, Estimated 51 (*)    All other components within normal limits  CBC -  Abnormal; Notable for the following components:   WBC 10.6 (*)    All other components within normal limits  CBG MONITORING, ED - Abnormal; Notable for the following components:   Glucose-Capillary 126 (*)    All other components within normal limits  SEDIMENTATION RATE  BASIC  METABOLIC PANEL    EKG None  Radiology DG Foot Complete Left  Result Date: 12/22/2022 CLINICAL DATA:  Concern for osteomyelitis of the great toe. EXAM: LEFT FOOT - COMPLETE 3+ VIEW COMPARISON:  None Available. FINDINGS: There is diffuse osteopenia. There is marked soft tissue swelling of the foot. There is no radiopaque foreign body. Evidence for fracture or dislocation. No cortical erosions are seen. Ulceration is seen overlying the plantar distal aspect of the first toe. There is pes planus deformity and collapse of the talus, possibly Charcot arthropathy. IMPRESSION: 1. No evidence of osteomyelitis. 2. Marked soft tissue swelling of the foot. 3. Pes planus deformity and collapse of the talus, possibly Charcot arthropathy. Electronically Signed   By: Darliss Cheney M.D.   On: 12/22/2022 15:31    Procedures Procedures    Medications Ordered in ED Medications  HYDROcodone-acetaminophen (NORCO/VICODIN) 5-325 MG per tablet 1 tablet (1 tablet Oral Given 12/22/22 2350)  carvedilol (COREG) tablet 6.25 mg (has no administration in time range)  nitroGLYCERIN (NITROSTAT) SL tablet 0.4 mg (has no administration in time range)  linagliptin (TRADJENTA) tablet 5 mg (has no administration in time range)  polyethylene glycol powder (GLYCOLAX/MIRALAX) container 17 g (has no administration in time range)  melatonin tablet 3 mg (has no administration in time range)  gabapentin (NEURONTIN) capsule 300 mg (has no administration in time range)  cholecalciferol (VITAMIN D3) 25 MCG (1000 UNIT) tablet 2,000 Units (has no administration in time range)  magnesium oxide (MAG-OX) tablet 400 mg (has no administration in time range)  potassium  chloride (KLOR-CON) CR tablet 40 mEq (has no administration in time range)  vitamin A & D ointment 1 Application (has no administration in time range)  enoxaparin (LOVENOX) injection 40 mg (40 mg Subcutaneous Given 12/22/22 2142)  0.9 % NaCl with KCl 20 mEq/ L  infusion ( Intravenous New Bag/Given 12/23/22 0006)  acetaminophen (TYLENOL) tablet 650 mg (has no administration in time range)    Or  acetaminophen (TYLENOL) suppository 650 mg (has no administration in time range)  traZODone (DESYREL) tablet 25 mg (has no administration in time range)  magnesium hydroxide (MILK OF MAGNESIA) suspension 30 mL (has no administration in time range)  ondansetron (ZOFRAN) tablet 4 mg (has no administration in time range)    Or  ondansetron (ZOFRAN) injection 4 mg (has no administration in time range)  potassium chloride 10 mEq in 100 mL IVPB (0 mEq Intravenous Stopped 12/22/22 2251)  potassium chloride SA (KLOR-CON M) CR tablet 20 mEq (20 mEq Oral Given 12/22/22 2126)  magnesium sulfate IVPB 2 g 50 mL (0 g Intravenous Stopped 12/22/22 2231)  sodium chloride 0.9 % bolus 500 mL (500 mLs Intravenous New Bag/Given 12/22/22 2127)    ED Course/ Medical Decision Making/ A&P                             Medical Decision Making [name] is an 89 year old female history of severe peripheral arterial disease follows with vascular surgery.  Patient also has a history of high degree heart block status post pacemaker presenting to the emergency department with a gangrenous left great toe.  This is a chronic process that she has been following with her wound care doctor however today wound care was concerned for spreading infection and sends her to the emergency department.  On arrival to the emergency department patient's blood pressure slightly hypertensive no evidence of acute tachycardia she is in  a paced rhythm she is satting 90% on room air.  Examination of left great toe there is dry gangrene to the mid toe.  No  evidence of acute purulence.  Patient is neurovascularly intact in the affected toe.  She is able to wiggle her toes she is sensate up to the level of the dry gangrene.  I was able to get a monophasic dopplerable pulse.  On review of patient's past medical documentation it seems as if vascular surgery was prime to do a angiography of the left lower extremity for potential endovascular repair of her PAD patient refused.  After extensive conversation with the patient she is willing to reconsider this procedure.  Therefore,vascular surgery was consulted.   In terms of patient's toe infection as previously mentioned no evidence of acute purulence patient has a slightly elevated BBC to 10.6 with an [place] of 8.2 but is otherwise afebrile does not appear acutely septic.  X-ray shows no evidence of acute osteomyelitis but given the extensive nature of her gangrene osteomyelitis is still on the differential.  Overall on chart review it appears that patient was previously set up with follow-up with vascular surgery for an angiography with potential for acute intervention to help fix her peripheral arterial disease.  In this fact I reached out to vascular surgery and talked to Dr. Stanford Breed.  While in the emergency department patient was noted to have derangements in her sodium potassium and chloride as well as slight elevation in her creatinine.  Per Dr. Archie Patten patient will require angiography and given her electrolyte abnormalities in the emergency department today will be admitted to the hospitalist service with vascular surgery following along for potential procedure midday tomorrow.  I will the patient to eat in the emergency department as she was hungry [place] repleted her potassium and magnesium.  Patient was admitted to the hospitalist service for further management.     Amount and/or Complexity of Data Reviewed Labs: ordered. Radiology: ordered.  Risk Prescription drug management. Decision regarding  hospitalization.           Final Clinical Impression(s) / ED Diagnoses Final diagnoses:  Toe gangrene ([place])  Hyponatremia  Hypokalemia    Rx / DC Orders ED Discharge Orders     None         Donzetta Matters, MD 12/23/22 0016    Teressa Lower, MD 12/23/22 [phone]

## 2022-12-22 NOTE — Assessment & Plan Note (Signed)
-  This is likely hypovolemic. - He will be hydrated with IV normal saline and will follow BMP.

## 2022-12-23 ENCOUNTER — Encounter (HOSPITAL_COMMUNITY)
Admission: EM | Disposition: A | Discharge status: Skilled Nursing Facility | Payer: Self-pay | Source: Ambulatory Visit | Attending: Internal Medicine

## 2022-12-23 DIAGNOSIS — E1142 Type 2 diabetes mellitus with diabetic polyneuropathy: Secondary | ICD-10-CM | POA: Insufficient documentation

## 2022-12-23 DIAGNOSIS — E876 Hypokalemia: Secondary | ICD-10-CM | POA: Diagnosis not present

## 2022-12-23 DIAGNOSIS — I70262 Atherosclerosis of native arteries of extremities with gangrene, left leg: Secondary | ICD-10-CM

## 2022-12-23 HISTORY — PX: ABDOMINAL AORTOGRAM W/LOWER EXTREMITY: CATH118223

## 2022-12-23 LAB — CBC
HCT: 39.2 % (ref 36.0–46.0)
HCT: 39.6 % (ref 36.0–46.0)
HCT: 40 % (ref 36.0–46.0)
Hemoglobin: 13.1 g/dL (ref 12.0–15.0)
Hemoglobin: 13.2 g/dL (ref 12.0–15.0)
Hemoglobin: 13.4 g/dL (ref 12.0–15.0)
MCH: 29.1 pg (ref 26.0–34.0)
MCH: 29.2 pg (ref 26.0–34.0)
MCH: 29.4 pg (ref 26.0–34.0)
MCHC: 33 g/dL (ref 30.0–36.0)
MCHC: 33.1 g/dL (ref 30.0–36.0)
MCHC: 34.2 g/dL (ref 30.0–36.0)
MCV: 86 fL (ref 80.0–100.0)
MCV: 88.1 fL (ref 80.0–100.0)
MCV: 88.2 fL (ref 80.0–100.0)
Platelets: 312 10*3/uL (ref 150–400)
Platelets: 315 10*3/uL (ref 150–400)
Platelets: 322 10*3/uL (ref 150–400)
RBC: 4.49 MIL/uL (ref 3.87–5.11)
RBC: 4.54 MIL/uL (ref 3.87–5.11)
RBC: 4.56 MIL/uL (ref 3.87–5.11)
RDW: 13.3 % (ref 11.5–15.5)
RDW: 13.6 % (ref 11.5–15.5)
RDW: 13.6 % (ref 11.5–15.5)
WBC: 10.6 10*3/uL — ABNORMAL HIGH (ref 4.0–10.5)
WBC: 9.3 10*3/uL (ref 4.0–10.5)
WBC: 9.5 10*3/uL (ref 4.0–10.5)
nRBC: 0 % (ref 0.0–0.2)
nRBC: 0 % (ref 0.0–0.2)
nRBC: 0 % (ref 0.0–0.2)

## 2022-12-23 LAB — BASIC METABOLIC PANEL
Anion gap: 11 (ref 5–15)
Anion gap: 12 (ref 5–15)
BUN: 45 mg/dL — ABNORMAL HIGH (ref 8–23)
BUN: 51 mg/dL — ABNORMAL HIGH (ref 8–23)
CO2: 28 mmol/L (ref 22–32)
CO2: 29 mmol/L (ref 22–32)
Calcium: 9.4 mg/dL (ref 8.9–10.3)
Calcium: 9.5 mg/dL (ref 8.9–10.3)
Chloride: 91 mmol/L — ABNORMAL LOW (ref 98–111)
Chloride: 97 mmol/L — ABNORMAL LOW (ref 98–111)
Creatinine, Ser: 0.98 mg/dL (ref 0.44–1.00)
Creatinine, Ser: 1.1 mg/dL — ABNORMAL HIGH (ref 0.44–1.00)
GFR, Estimated: 48 mL/min — ABNORMAL LOW (ref >60)
GFR, Estimated: 55 mL/min — ABNORMAL LOW (ref >60)
Glucose, Bld: 121 mg/dL — ABNORMAL HIGH (ref 70–99)
Glucose, Bld: 212 mg/dL — ABNORMAL HIGH (ref 70–99)
Potassium: 2.9 mmol/L — ABNORMAL LOW (ref 3.5–5.1)
Potassium: 3.8 mmol/L (ref 3.5–5.1)
Sodium: 132 mmol/L — ABNORMAL LOW (ref 135–145)
Sodium: 136 mmol/L (ref 135–145)

## 2022-12-23 LAB — HEMOGLOBIN A1C
Hgb A1c MFr Bld: 7 % — ABNORMAL HIGH (ref 4.8–5.6)
Mean Plasma Glucose: 154.2 mg/dL

## 2022-12-23 LAB — MAGNESIUM: Magnesium: 2.6 mg/dL — ABNORMAL HIGH (ref 1.7–2.4)

## 2022-12-23 LAB — SEDIMENTATION RATE: Sed Rate: 86 mm/hr — ABNORMAL HIGH (ref 0–22)

## 2022-12-23 LAB — CREATININE, SERUM
Creatinine, Ser: 0.97 mg/dL (ref 0.44–1.00)
GFR, Estimated: 56 mL/min — ABNORMAL LOW (ref >60)

## 2022-12-23 LAB — GLUCOSE, CAPILLARY
Glucose-Capillary: 124 mg/dL — ABNORMAL HIGH (ref 70–99)
Glucose-Capillary: 104 mg/dL — ABNORMAL HIGH (ref 70–99)
Glucose-Capillary: 268 mg/dL — ABNORMAL HIGH (ref 70–99)
Glucose-Capillary: 159 mg/dL — ABNORMAL HIGH (ref 70–99)

## 2022-12-23 SURGERY — ABDOMINAL AORTOGRAM W/LOWER EXTREMITY
Anesthesia: LOCAL | Laterality: Left

## 2022-12-23 MED ORDER — ATORVASTATIN CALCIUM 40 MG PO TABS: 40.0000 mg | ORAL_TABLET | Freq: Every day | ORAL | Status: DC

## 2022-12-23 MED ORDER — HEPARIN SODIUM (PORCINE) 5000 UNIT/ML IJ SOLN: 5000.0000 [IU] | Freq: Three times a day (TID) | INTRAMUSCULAR | Status: DC

## 2022-12-23 MED ORDER — SODIUM CHLORIDE 0.9 % IV SOLN: 250.0000 mL | INTRAVENOUS | Status: DC | PRN

## 2022-12-23 MED ORDER — SODIUM CHLORIDE 0.9 % WEIGHT BASED INFUSION
1.0000 mL/kg/h | INTRAVENOUS | Status: AC
  Administered 2022-12-23: 1 mL/kg/h via INTRAVENOUS

## 2022-12-23 MED ORDER — SODIUM CHLORIDE 0.9% FLUSH: 3.0000 mL | INTRAVENOUS | Status: DC | PRN

## 2022-12-23 MED ORDER — LABETALOL HCL 5 MG/ML IV SOLN: 10.0000 mg | INTRAVENOUS | Status: DC | PRN

## 2022-12-23 MED ORDER — HEPARIN (PORCINE) IN NACL 1000-0.9 UT/500ML-% IV SOLN
INTRAVENOUS | Status: AC
  Filled 2022-12-23: qty 500

## 2022-12-23 MED ORDER — FENTANYL CITRATE (PF) 100 MCG/2ML IJ SOLN
INTRAMUSCULAR | Status: AC
  Filled 2022-12-23: qty 2

## 2022-12-23 MED ORDER — ONDANSETRON HCL 4 MG/2ML IJ SOLN: 4.0000 mg | Freq: Four times a day (QID) | INTRAMUSCULAR | Status: DC | PRN

## 2022-12-23 MED ORDER — MIDAZOLAM HCL 2 MG/2ML IJ SOLN
INTRAMUSCULAR | Status: DC | PRN
  Administered 2022-12-23: 1 mg via INTRAVENOUS

## 2022-12-23 MED ORDER — HYDROMORPHONE HCL 1 MG/ML IJ SOLN
0.5000 mg | INTRAMUSCULAR | Status: DC | PRN
  Administered 2022-12-23 – 2022-12-30 (×9): 0.5 mg via INTRAVENOUS
  Filled 2022-12-23 (×9): qty 0.5

## 2022-12-23 MED ORDER — INSULIN ASPART 100 UNIT/ML IJ SOLN: 0.0000 [IU] | Freq: Three times a day (TID) | INTRAMUSCULAR | Status: DC

## 2022-12-23 MED ORDER — IODIXANOL 320 MG/ML IV SOLN
INTRAVENOUS | Status: DC | PRN
  Administered 2022-12-23: 65 mL

## 2022-12-23 MED ORDER — HEPARIN (PORCINE) IN NACL 1000-0.9 UT/500ML-% IV SOLN
INTRAVENOUS | Status: DC | PRN
  Administered 2022-12-23 (×2): 500 mL

## 2022-12-23 MED ORDER — POLYETHYLENE GLYCOL 3350 17 G PO PACK
17.0000 g | PACK | Freq: Every day | ORAL | Status: DC
  Administered 2022-12-24 – 2022-12-31 (×6): 17 g via ORAL
  Filled 2022-12-23 (×7): qty 1

## 2022-12-23 MED ORDER — MIDAZOLAM HCL 2 MG/2ML IJ SOLN
INTRAMUSCULAR | Status: AC
  Filled 2022-12-23: qty 2

## 2022-12-23 MED ORDER — LIDOCAINE HCL (PF) 1 % IJ SOLN
INTRAMUSCULAR | Status: DC | PRN
  Administered 2022-12-23: 15 mL

## 2022-12-23 MED ORDER — FENTANYL CITRATE (PF) 100 MCG/2ML IJ SOLN
INTRAMUSCULAR | Status: DC | PRN
  Administered 2022-12-23: 50 ug via INTRAVENOUS

## 2022-12-23 MED ORDER — HYDROCODONE-ACETAMINOPHEN 5-325 MG PO TABS
1.0000 | ORAL_TABLET | ORAL | Status: DC | PRN
  Administered 2022-12-24 – 2022-12-25 (×6): 2 via ORAL
  Filled 2022-12-23 (×7): qty 2

## 2022-12-23 MED ORDER — HYDRALAZINE HCL 20 MG/ML IJ SOLN: 5.0000 mg | INTRAMUSCULAR | Status: DC | PRN

## 2022-12-23 MED ORDER — ACETAMINOPHEN 325 MG PO TABS: 650.0000 mg | ORAL_TABLET | ORAL | Status: DC | PRN

## 2022-12-23 MED ORDER — LIDOCAINE HCL (PF) 1 % IJ SOLN
INTRAMUSCULAR | Status: AC
  Filled 2022-12-23: qty 30

## 2022-12-23 MED ORDER — SODIUM CHLORIDE 0.9% FLUSH
3.0000 mL | Freq: Two times a day (BID) | INTRAVENOUS | Status: DC
  Administered 2022-12-23 – 2022-12-30 (×12): 3 mL via INTRAVENOUS

## 2022-12-23 SURGICAL SUPPLY — 17 items
CATH ANGIO 5F PIGTAIL 65CM (CATHETERS) IMPLANT
CATH CROSS OVER TEMPO 5F (CATHETERS) IMPLANT
CATH STRAIGHT 5FR 65CM (CATHETERS) IMPLANT
DEVICE CLOSURE MYNXGRIP 5F (Vascular Products) IMPLANT
DEVICE TORQUE .025-.038 (MISCELLANEOUS) IMPLANT
GUIDEWIRE ANGLED .035X150CM (WIRE) IMPLANT
KIT MICROPUNCTURE NIT STIFF (SHEATH) IMPLANT
KIT PV (KITS) ×1 IMPLANT
MAT PREVALON FULL STRYKER (MISCELLANEOUS) IMPLANT
SHEATH PINNACLE 5F 10CM (SHEATH) IMPLANT
SHEATH PROBE COVER 6X72 (BAG) IMPLANT
STOPCOCK MORSE 400PSI 3WAY (MISCELLANEOUS) IMPLANT
SYR MEDRAD MARK 7 150ML (SYRINGE) ×1 IMPLANT
TRANSDUCER W/STOPCOCK (MISCELLANEOUS) ×1 IMPLANT
TRAY PV CATH (CUSTOM PROCEDURE TRAY) ×1 IMPLANT
TUBING CIL FLEX 10 FLL-RA (TUBING) IMPLANT
WIRE BENTSON .035X145CM (WIRE) IMPLANT

## 2022-12-23 NOTE — Progress Notes (Signed)
New Admission Note:    Arrival Method: ED stretcher Mental Orientation: AAOx4 Telemetry: 5M15 Assessment: Completed Skin: See flowsheet IV: RFA Pain: 0/10 Tubes: n/a Safety Measures: Safety Fall Prevention Plan has been given, discussed and signed Admission: Completed [facility] Orientation: Patient has been orientated to the room, unit and staff.  Family: son at bedside   Orders have been reviewed and implemented. Will continue to monitor the patient. Call light has been placed within reach and bed alarm has been activated.

## 2022-12-23 NOTE — Op Note (Signed)
DATE OF SERVICE: 12/23/2022  PATIENT:  [name]  89 y.o. female  PRE-OPERATIVE DIAGNOSIS:  Atherosclerosis of native arteries of left lower extremity causing gangrene  POST-OPERATIVE DIAGNOSIS:  Same  PROCEDURE:   1) Ultrasound guided right common femoral artery access 2) Aortogram 3) Left lower extremity angiogram with second order cannulation 4) Conscious sedation (43 minutes)   SURGEON:  [name]. Stanford Breed, MD  ASSISTANT: none  ANESTHESIA:   local and IV sedation  ESTIMATED BLOOD LOSS: minimal  LOCAL MEDICATIONS USED:  LIDOCAINE   COUNTS: confirmed correct.  PATIENT DISPOSITION:  PACU - hemodynamically stable.   Delay start of Pharmacological VTE agent (>24hrs) due to surgical blood loss or risk of bleeding: no  INDICATION FOR PROCEDURE: [name] is a 89 y.o. female with left foot gangrene and known history of peripheral arterial disease. After careful discussion of risks, benefits, and alternatives the patient was offered angiography. The patient understood and wished to proceed.  OPERATIVE FINDINGS:  Terminal aorta and iliac arteries: Tortuous but widely patent  Left lower extremity: Common femoral artery: widely patent  Profunda femoris artery: widely patent  Superficial femoral artery: diffuse, severe disease proximally. Occludes mid thigh Popliteal artery: occluded Anterior tibial artery: occluded Tibioperoneal trunk: occluded Peroneal artery: occluded Posterior tibial artery: occluded Pedal circulation: occluded  DESCRIPTION OF PROCEDURE: After identification of the patient in the pre-operative holding area, the patient was transferred to the operating room. The patient was positioned supine on the operating room table.  Anesthesia was induced. The groins was prepped and draped in standard fashion. A surgical pause was performed confirming correct patient, procedure, and operative location.  The right groin was anesthetized with subcutaneous  injection of 1% lidocaine. Using ultrasound guidance, the right common femoral artery was accessed with micropuncture technique. Fluoroscopy was used to confirm cannulation over the femoral head. The 18F sheath was upsized to 74F.   A Benson wire was advanced into the distal aorta. Over the wire an omni flush catheter was advanced to the level of L2. Aortogram was performed - see above for details.   The left common iliac artery was selected with an omniflush catheter and glidewire guidewire. The wire was advanced into the common femoral artery. Over the wire the omni flush catheter was advanced into the external iliac artery. Selective angiography was performed - see above for details.   A mynx device was used to close the arteriotomy. Hemostasis was excellent upon completion.  Conscious sedation was administered with the use of IV fentanyl and midazolam under continuous physician and nurse monitoring.  Heart rate, blood pressure, and oxygen saturation were continuously monitored.  Total sedation time was 43 minutes  Upon completion of the case instrument and sharps counts were confirmed correct. The patient was transferred to the PACU in good condition. I was present for all portions of the procedure.  PLAN: No options for revascularization. All of patient's named arteries below the thigh are occluded. Recommend palliative care evaluation. Her only surgical options is an above knee amputation.  [name]. Stanford Breed, MD [facility] Phone Number: [phone] 12/23/2022 12:14 PM

## 2022-12-23 NOTE — Assessment & Plan Note (Signed)
-   The patient will be placed on supplement coverage with NovoLog. - We will continue Neurontin. - We will continue Januvia.

## 2022-12-23 NOTE — Progress Notes (Signed)
[facility] [place]  ASSESSMENT / PLAN: [name] is a 89 y.o. female with atherosclerosis of native arteries of left lower extremity causing gangrene.  Recommend the following which can slow the progression of atherosclerosis and reduce the risk of major adverse cardiac / limb events:  Complete cessation from all tobacco products. Blood glucose control with goal A1c < 7%. Blood pressure control with goal blood pressure < 140/90 mmHg. Lipid reduction therapy with goal LDL-C <100 mg/dL (<93 if symptomatic from PAD).  Aspirin 81mg  PO QD.  Atorvastatin 40-80mg  PO QD (or other "high intensity" statin therapy).  I reviewed the risks / benefits / alternatives to angiography with the patient.  I think an angiogram is reasonable for this patient.  She understands she is not a candidate for open vascular surgery.  She understands we may have no options for making her circulation better.  She understands she would likely need a toe amputation if we are able to successfully revascularize her endovascularly.  We will plan for angiography today in cath lab.  CHIEF COMPLAINT: gangrene about left great toe.  HISTORY OF PRESENT ILLNESS: [name] is a 89 y.o. female referred to clinic from the wound care center for evaluation of a left great toe ulcer.  She has been under the care of Dr. 92.  The patient is an elderly, nonambulatory, nursing home resident who is morbidly obese but is mentally quite sharp.  She reports the ulcer has been getting slowly better with local wound care.  04/19/22: returns to clinic. Her wound is improving. She is grateful for the wound care center. Her son is with her today.  09/27/22: Re-referred to clinic.  Her son is with her today.  Her left great toe ulcer has not healed.  We spent the bulk of the visit reviewing risks, benefits, and alternatives to angiography.  I counseled her about the small, but real risk of bleeding or clotting  problems after angiography which could result in emergent surgery.  She explained to me that her foot is very bothersome to her and quite painful.  12/23/22: patient admitted for deterioration of wound. Now has gangrene. Foot is painful to her.   Past Medical History:  Diagnosis Date   Arthritis    "qwhere"   AV block     2:1  January 14, 2011 / pacemaker plan when cellulitis resolved in length   Bradycardia    2:1  AV block   Cellulitis    Superficial cellulitis right lower leg   January 14, 2011   Chronic diastolic CHF (congestive heart failure) (HCC)    Diabetes mellitus without complication (HCC)    Edema    . EF 65-70%, echo, November 29, 2010   Ejection fraction    EF 65%, echo, January, 2012   Hypertension    Hypokalemia    October, 2012   Murmur    No significant valvular disease by echo, January, 2012   Pacemaker    January, 2013 Medtronic Sensa   Pulmonary hypertension (HCC)     46 mmHg... echo... January, 2012    Past Surgical History:  Procedure Laterality Date   CHOLECYSTECTOMY     HIP ARTHROPLASTY Right    INSERT / REPLACE / REMOVE PACEMAKER  11/2011   PARTIAL HYSTERECTOMY     vaginal   PERMANENT PACEMAKER INSERTION N/A 12/09/2011   Procedure: PERMANENT PACEMAKER INSERTION;  Surgeon: 12/11/2011, MD;  Location: [facility] CATH LAB;  Service: Cardiovascular;  Laterality: N/A;   PPM GENERATOR CHANGEOUT N/A 01/03/2022   Procedure: PPM GENERATOR CHANGEOUT;  Surgeon: Evans Lance, MD;  Location: [facility] CV LAB;  Service: Cardiovascular;  Laterality: N/A;    Family History  Problem Relation Age of Onset   Cardiomyopathy Father    Diabetes Father    Heart failure Father    Dementia Mother    Cancer Son    Coronary artery disease Son    Heart attack Neg Hx     Social History   Socioeconomic History   Marital status: Widowed    Spouse name: Not on file   Number of children: Not on file   Years of education: Not on file   Highest education level: Not on  file  Occupational History   Occupation: Retired    Fish farm manager: RETIRED  Tobacco Use   Smoking status: Never   Smokeless tobacco: Never  Vaping Use   Vaping Use: Never used  Substance and Sexual Activity   Alcohol use: No   Drug use: No   Sexual activity: Never  Other Topics Concern   Not on file  Social History Narrative   Retired    Widowed    Tobacco Use - No.    Alcohol Use - no   Regular Exercise - no   Drug Use - no   Social Determinants of Radio broadcast assistant Strain: Not on file  Food Insecurity: Not on file  Transportation Needs: Not on file  Physical Activity: Not on file  Stress: Not on file  Social Connections: Not on file  Intimate Partner Violence: Not on file    Allergies  Allergen Reactions   Aspirin Rash and Other (See Comments)    GI distress   Tramadol Other (See Comments)    GI distress   Naproxen     Unknown reaction   Shellfish-Derived Products Itching and Rash    Current Facility-Administered Medications  Medication Dose Route Frequency Provider Last Rate Last Admin   0.9 % NaCl with KCl 20 mEq/ L  infusion   Intravenous Continuous Mansy, Jan A, MD 100 mL/hr at 12/23/22 0006 New Bag at 12/23/22 0006   acetaminophen (TYLENOL) tablet 650 mg  650 mg Oral Q6H PRN Mansy, Jan A, MD       Or   acetaminophen (TYLENOL) suppository 650 mg  650 mg Rectal Q6H PRN Mansy, Jan A, MD       carvedilol (COREG) tablet 6.25 mg  6.25 mg Oral BID WC Mansy, Jan A, MD       cholecalciferol (VITAMIN D3) 25 MCG (1000 UNIT) tablet 2,000 Units  2,000 Units Oral q AM Mansy, Jan A, MD       enoxaparin (LOVENOX) injection 40 mg  40 mg Subcutaneous Q24H Mansy, Jan A, MD   40 mg at 12/22/22 2142   gabapentin (NEURONTIN) capsule 300 mg  300 mg Oral BID Mansy, Jan A, MD   300 mg at 12/23/22 0043   HYDROcodone-acetaminophen (NORCO/VICODIN) 5-325 MG per tablet 1 tablet  1 tablet Oral Q6H PRN Mansy, Jan A, MD   1 tablet at 12/22/22 2350   insulin aspart (novoLOG)  injection 0-9 Units  0-9 Units Subcutaneous TID WC Mansy, Jan A, MD       linagliptin (TRADJENTA) tablet 5 mg  5 mg Oral Daily Mansy, Jan A, MD       magnesium hydroxide (MILK OF MAGNESIA) suspension 30 mL  30 mL Oral Daily PRN Mansy, Jan  A, MD       magnesium oxide (MAG-OX) tablet 400 mg  400 mg Oral Daily Mansy, Jan A, MD       melatonin tablet 3 mg  3 mg Oral QHS Mansy, Jan A, MD   3 mg at 12/23/22 0043   nitroGLYCERIN (NITROSTAT) SL tablet 0.4 mg  0.4 mg Sublingual Q5 min PRN Mansy, Jan A, MD       ondansetron [facility]) tablet 4 mg  4 mg Oral Q6H PRN Mansy, Jan A, MD       Or   ondansetron [facility]) injection 4 mg  4 mg Intravenous Q6H PRN Mansy, Jan A, MD       polyethylene glycol (MIRALAX / GLYCOLAX) packet 17 g  17 g Oral Daily Mansy, Jan A, MD       potassium chloride (KLOR-CON) CR tablet 40 mEq  40 mEq Oral BID Mansy, Jan A, MD   40 mEq at 12/23/22 0046   traZODone (DESYREL) tablet 25 mg  25 mg Oral QHS PRN Mansy, Jan A, MD   25 mg at 12/23/22 0120   vitamin A & D ointment 1 Application  1 Application Topical Q0G PRN Mansy, Jan A, MD        PHYSICAL EXAM Vitals:   12/22/22 1857 12/22/22 2120 12/22/22 2333 12/23/22 0318  BP:  (!) 125/53 (!) 116/54 (!) 102/57  Pulse:  90  78  Resp:  (!) 21 19 20   Temp: 98.1 F (36.7 C)  98.2 F (36.8 C) 97.8 F (36.6 C)  TempSrc: Oral  Oral Oral  SpO2:  97% 96% 97%  Weight:   90.1 kg   Height:          Constitutional: Elderly woman in wheelchair.  No acute distress Cardiac: regular rate and rhythm.  Respiratory:  unlabored. Abdominal:  soft, non-tender, non-distended.  Peripheral vascular: Palpable pedal pulses.  Gangrenous left great toe tip.  PERTINENT LABORATORY AND RADIOLOGIC DATA  Most recent CBC    Latest Ref Rng & Units 12/22/2022   11:51 PM 12/22/2022    3:34 PM 12/10/2021   12:41 PM  CBC  WBC 4.0 - 10.5 K/uL 10.6  10.6  6.7   Hemoglobin 12.0 - 15.0 g/dL 13.4  13.5  12.2   Hematocrit 36.0 - 46.0 % 39.2  41.8  36.9    Platelets 150 - 400 K/uL 315  342  290      Most recent CMP    Latest Ref Rng & Units 12/22/2022   11:51 PM 12/22/2022    8:06 PM 12/22/2022    3:34 PM  CMP  Glucose 70 - 99 mg/dL 212  119  128   BUN 8 - 23 mg/dL 51  49  50   Creatinine 0.44 - 1.00 mg/dL 1.10  1.05  1.10   Sodium 135 - 145 mmol/L 132  131  131   Potassium 3.5 - 5.1 mmol/L 2.9  3.1  2.8   Chloride 98 - 111 mmol/L 91  90  88   CO2 22 - 32 mmol/L 29  26  31    Calcium 8.9 - 10.3 mg/dL 9.4  9.6  9.8   Total Protein 6.5 - 8.1 g/dL  7.7  8.2   Total Bilirubin 0.3 - 1.2 mg/dL  0.6  0.5   Alkaline Phos 38 - 126 U/L  76  87   AST 15 - 41 U/L  26  27   ALT 0 - 44 U/L  9  10     Renal function Estimated Creatinine Clearance: 34.6 mL/min (A) (by C-G formula based on SCr of 1.1 mg/dL (H)).  Hgb A1c MFr Bld (%)  Date Value  12/22/2022 7.0 (H)    Vascular Imaging:  +-------+-----------+-----------+------------+------------+  ABI/TBIToday's ABIToday's TBIPrevious ABIPrevious TBI  +-------+-----------+-----------+------------+------------+  Right  0.74       0.38                                 +-------+-----------+-----------+------------+------------+  Left   0.49       bandage                              +-------+-----------+-----------+------------+------------+  [name]. Stanford Breed, MD [facility] Phone Number: [phone] 12/23/2022 7:54 AM  Total time spent on preparing this encounter including chart review, data review, collecting history, examining the patient, coordinating care for this established patient, 40 minutes.  Portions of this report may have been transcribed using voice recognition software.  Every effort has been made to ensure accuracy; however, inadvertent computerized transcription errors may still be present.

## 2022-12-23 NOTE — Progress Notes (Signed)
PROGRESS NOTE    [name]  DQQ:[id number] DOB: [birth year] DOA: 12/22/2022 PCP: System, Provider Not In    Brief Narrative:   CRISTEL RAIL is a 89 y.o. female with past medical history significant for DM2, HTN, chronic diastolic congestive heart failure, complete heart block s/p PPM, pulmonary hypertension who presented to [facility] ED via EMS from wound care clinic with left great toe wound.  Patient was seen in the clinic by Dr. Celine Ahr with notable oozing frank pus from her left great toe and was referred to the ED for potential need of amputation and angiography.  Patient denies fever/chills, no nausea/vomiting/diarrhea, no abdominal pain, no chest pain, no palpitations, no urinary symptoms.  In the ED, temperature 98.0 F, HR 87, RR 13, BP 147/71, SpO2 94% on room air.  WBC 10.6, hemoglobin 13.5, platelets 342.  Sodium 131, potassium 2.8, chloride 88, CO2 31, glucose 128, BUN 50, creatinine 1.10.  AST 27, ALT 10, total bilirubin 0.5.  CRP 2.7, ESR 86.  Left foot x-ray with no evidence of osteomyelitis, marked soft tissue swelling of the foot, pes planus deformity and collapse of the talus possibly Charcot arthropathy.  Vascular surgery was consulted.  EDP repleted potassium and magnesium.  TRH consulted for admission for further evaluation and management of left great toe gangrene, hyponatremia, hypokalemia.  Assessment & Plan:   Left great toe gangrene Patient presenting to the ED as a referral from the wound care clinic for left great toe gangrene with associated purulence.  Patient is afebrile but with slight leukocytosis of 10.6 on admission.  Elevated CRP/ESR markers.  X-ray with no evidence of osteomyelitis but with marked soft tissue swelling of the foot. -- Vascular surgery on, appreciate assistance -- Plan for angiography today -- Gabapentin 3 mg p.o. twice daily -- Norco 5-325 mg p.o. every 6 hours as needed moderate pain -- Further per vascular surgery  Hyponatremia Sodium  131 on admission, likely hypovolemic hyponatremia in the setting of poor oral intake in the days preceding hospitalization. -- Na 131>>136 -- continue IVF hydration with NS w/ 20 KCL at 100 mL/h -- Hold home furosemide/metolazone -- BMP in am  Hypokalemia Potassium 2.8 on admission.  Repleted. -- K 2.8>>3.8 -- continue IVF w/ NS w/ 20 Kcl at 100 mL/h -- BMP and Mg level in am  Type 2 diabetes mellitus Hemoglobin A1c 7.0, at goal.  Home regimen includes Januvia 50 mg p.o. daily. -- Continue Tradjenta as hospital substitution -- SSI for coverage -- CBGs qAC/HS  Essential hypertension Chronic diastolic congestive heart failure Home regimen includes carvedilol 6.25 mg p.o. twice daily, furosemide 80 mg p.o. daily, metolazone 2.5 mg on M/T/W/T. -- Carvedilol 6.25 mg p.o. twice daily -- Holding home furosemide and metolazone due to hyponatremia, volume depletion as above -- Continue monitor BP closely -- Outpatient follow-up with cardiology  Hx complete heart block s/p PPM -- Continue monitor on telemetry -- Outpatient follow-up with cardiology  Generalized weakness/debility/gait disturbance: Current resides at [facility] consulted for assistance when patient medically cleared for discharge  Morbid obesity Body mass index is 38.79 kg/m.  Discussed with patient needs for aggressive lifestyle changes/weight loss as this complicates all facets of care.  Outpatient follow-up with PCP.     DVT prophylaxis: enoxaparin (LOVENOX) injection 40 mg Start: 12/22/22 2030    Code Status: Full Code Family Communication: Updated son present at bedside this morning  Disposition Plan:  Level of care: Telemetry Medical Status is: Inpatient  Remains inpatient appropriate because: Pending angiography/possible surgical invention by vascular surgery    Consultants:  Vascular surgery, Dr. Stanford Breed  Procedures:  Abdominal aortogram with lower extremity runoff:  Pending  Antimicrobials:  none   Subjective: Patient seen examined bedside, resting comfortably.  Lying in bed.  Complaining of pain to left great toe.  Son present at bedside.  Discussed plan of arteriogram with patient today, she understands.  No other specific complaints or concerns at this time.  Denies headache, no dizziness, no chest pain, no shortness of breath, no abdominal pain, no focal weakness, no fatigue, no paresthesias.  No acute events overnight per nursing staff.  Objective: Vitals:   12/23/22 1126 12/23/22 1131 12/23/22 1136 12/23/22 1141  BP: 139/63 (!) 155/61 (!) 149/73 139/67  Pulse: 85 85 83 88  Resp: (!) 21 17 17  (!) 21  Temp:      TempSrc:      SpO2: 95% 95% 95% 96%  Weight:      Height:        Intake/Output Summary (Last 24 hours) at 12/23/2022 1152 Last data filed at 12/23/2022 0700 Gross per 24 hour  Intake 852.76 ml  Output --  Net 852.76 ml   Filed Weights   12/22/22 1430 12/22/22 2333  Weight: 92.4 kg 90.1 kg    Examination:  Physical Exam: GEN: NAD, alert and oriented x 3, early in appearance, obese HEENT: NCAT, PERRL, EOMI, sclera clear, MMM PULM: CTAB w/o wheezes/crackles, normal respiratory effort, on room air CV: RRR w/o M/G/R GI: abd soft, NTND, NABS, no R/G/M MSK: Right foot edema, mild tenderness distal aspect left foot without warmth, noted black discoloration tip left great toe, moves all extremities independently, NEURO: CN II-XII intact, no focal deficits, sensation to light touch intact PSYCH: normal mood/affect Integumentary: Left foot as above, otherwise no other concerning rashes/lesions/wounds noted on exposed skin surfaces     Data Reviewed: I have personally reviewed following labs and imaging studies  CBC: Recent Labs  Lab 12/22/22 1534 12/22/22 2351 12/23/22 0747  WBC 10.6* 10.6* 9.5  NEUTROABS 8.2*  --   --   HGB 13.5 13.4 13.1  HCT 41.8 39.2 39.6  MCV 88.7 86.0 88.2  PLT 342 315 182   Basic Metabolic  Panel: Recent Labs  Lab 12/22/22 1534 12/22/22 2006 12/22/22 2351 12/23/22 0747  NA 131* 131* 132* 136  K 2.8* 3.1* 2.9* 3.8  CL 88* 90* 91* 97*  CO2 31 26 29 28   GLUCOSE 128* 119* 212* 121*  BUN 50* 49* 51* 45*  CREATININE 1.10* 1.05* 1.10* 0.98  CALCIUM 9.8 9.6 9.4 9.5  MG  --   --   --  2.6*   GFR: Estimated Creatinine Clearance: 38.9 mL/min (by C-G formula based on SCr of 0.98 mg/dL). Liver Function Tests: Recent Labs  Lab 12/22/22 1534 12/22/22 2006  AST 27 26  ALT 10 9  ALKPHOS 87 76  BILITOT 0.5 0.6  PROT 8.2* 7.7  ALBUMIN 3.5 3.3*   No results for input(s): "LIPASE", "AMYLASE" in the last 168 hours. No results for input(s): "AMMONIA" in the last 168 hours. Coagulation Profile: No results for input(s): "INR", "PROTIME" in the last 168 hours. Cardiac Enzymes: No results for input(s): "CKTOTAL", "CKMB", "CKMBINDEX", "TROPONINI" in the last 168 hours. BNP (last 3 results) No results for input(s): "PROBNP" in the last 8760 hours. HbA1C: Recent Labs    12/22/22 2351  HGBA1C 7.0*   CBG: Recent Labs  Lab 12/22/22 2017 12/23/22  [place]   Lipid Profile: No results for input(s): "CHOL", "HDL", "LDLCALC", "TRIG", "CHOLHDL", "LDLDIRECT" in the last 72 hours. Thyroid Function Tests: No results for input(s): "TSH", "T4TOTAL", "FREET4", "T3FREE", "THYROIDAB" in the last 72 hours. Anemia Panel: No results for input(s): "VITAMINB12", "FOLATE", "FERRITIN", "TIBC", "IRON", "RETICCTPCT" in the last 72 hours. Sepsis Labs: No results for input(s): "PROCALCITON", "LATICACIDVEN" in the last 168 hours.  No results found for this or any previous visit (from the past 240 hour(s)).       Radiology Studies: DG Foot Complete Left  Result Date: 12/22/2022 CLINICAL DATA:  Concern for osteomyelitis of the great toe. EXAM: LEFT FOOT - COMPLETE 3+ VIEW COMPARISON:  None Available. FINDINGS: There is diffuse osteopenia. There is marked soft tissue swelling of  the foot. There is no radiopaque foreign body. Evidence for fracture or dislocation. No cortical erosions are seen. Ulceration is seen overlying the plantar distal aspect of the first toe. There is pes planus deformity and collapse of the talus, possibly Charcot arthropathy. IMPRESSION: 1. No evidence of osteomyelitis. 2. Marked soft tissue swelling of the foot. 3. Pes planus deformity and collapse of the talus, possibly Charcot arthropathy. Electronically Signed   By: Ronney Asters M.D.   On: 12/22/2022 15:31        Scheduled Meds:  [MAR Hold] carvedilol  6.25 mg Oral BID WC   [MAR Hold] cholecalciferol  2,000 Units Oral q AM   [MAR Hold] enoxaparin (LOVENOX) injection  40 mg Subcutaneous Q24H   [MAR Hold] gabapentin  300 mg Oral BID   [MAR Hold] insulin aspart  0-9 Units Subcutaneous TID WC   [MAR Hold] linagliptin  5 mg Oral Daily   [MAR Hold] magnesium oxide  400 mg Oral Daily   [MAR Hold] melatonin  3 mg Oral QHS   [MAR Hold] polyethylene glycol  17 g Oral Daily   [MAR Hold] potassium chloride  40 mEq Oral BID   Continuous Infusions:  0.9 % NaCl with KCl 20 mEq / L 100 mL/hr at 12/23/22 1014     LOS: 1 day    Time spent: 52 minutes spent on chart review, discussion with nursing staff, consultants, updating family and interview/physical exam; more than 50% of that time was spent in counseling and/or coordination of care.    Mohamadou Maciver J [place] DO [facility] Available via Epic secure chat 7am-7pm After these hours, please refer to coverage provider listed on [email or web address] 12/23/2022, 11:52 AM

## 2022-12-23 NOTE — Progress Notes (Signed)
Pt arrived to unit from cath lab  VSS, A/O x 4,  CCMD called ,CHG given, pt oriented to unit,Will continue to monitor.   Albin Felling Mairi Stagliano, RN    12/23/22 1238  Vitals  Temp 98.5 F (36.9 C)  Temp Source Oral  BP (!) 144/58  MAP (mmHg) 83  BP Location Right Arm  BP Method Automatic  Patient Position (if appropriate) Lying  Pulse Rate Source Monitor  ECG Heart Rate 84  Resp 18  Level of Consciousness  Level of Consciousness Alert  Oxygen Therapy  SpO2 100 %  O2 Device Room Air  O2 Flow Rate (L/min) 0 L/min  Pain Assessment  Pain Scale 0-10  Pain Score 7  MEWS Score  MEWS Temp 0  MEWS Systolic 0  MEWS Pulse 0  MEWS RR 0  MEWS LOC 0  MEWS Score 0  MEWS Score Color Nyoka Cowden

## 2022-12-24 DIAGNOSIS — E876 Hypokalemia: Secondary | ICD-10-CM | POA: Diagnosis not present

## 2022-12-24 LAB — LIPID PANEL
Cholesterol: 173 mg/dL (ref 0–200)
HDL: 31 mg/dL — ABNORMAL LOW (ref >40)
LDL Cholesterol: 118 mg/dL — ABNORMAL HIGH (ref 0–99)
Total CHOL/HDL Ratio: 5.6 RATIO
Triglycerides: 122 mg/dL (ref <150)
VLDL: 24 mg/dL (ref 0–40)

## 2022-12-24 LAB — CBC
HCT: 34.6 % — ABNORMAL LOW (ref 36.0–46.0)
Hemoglobin: 11.1 g/dL — ABNORMAL LOW (ref 12.0–15.0)
MCH: 28.9 pg (ref 26.0–34.0)
MCHC: 32.1 g/dL (ref 30.0–36.0)
MCV: 90.1 fL (ref 80.0–100.0)
Platelets: 300 10*3/uL (ref 150–400)
RBC: 3.84 MIL/uL — ABNORMAL LOW (ref 3.87–5.11)
RDW: 13.9 % (ref 11.5–15.5)
WBC: 9.7 10*3/uL (ref 4.0–10.5)
nRBC: 0 % (ref 0.0–0.2)

## 2022-12-24 LAB — BASIC METABOLIC PANEL
Anion gap: 6 (ref 5–15)
BUN: 33 mg/dL — ABNORMAL HIGH (ref 8–23)
CO2: 25 mmol/L (ref 22–32)
Calcium: 8.6 mg/dL — ABNORMAL LOW (ref 8.9–10.3)
Chloride: 105 mmol/L (ref 98–111)
Creatinine, Ser: 1.02 mg/dL — ABNORMAL HIGH (ref 0.44–1.00)
GFR, Estimated: 53 mL/min — ABNORMAL LOW (ref >60)
Glucose, Bld: 111 mg/dL — ABNORMAL HIGH (ref 70–99)
Potassium: 3.7 mmol/L (ref 3.5–5.1)
Sodium: 136 mmol/L (ref 135–145)

## 2022-12-24 LAB — GLUCOSE, CAPILLARY
Glucose-Capillary: 102 mg/dL — ABNORMAL HIGH (ref 70–99)
Glucose-Capillary: 102 mg/dL — ABNORMAL HIGH (ref 70–99)
Glucose-Capillary: 105 mg/dL — ABNORMAL HIGH (ref 70–99)
Glucose-Capillary: 143 mg/dL — ABNORMAL HIGH (ref 70–99)
Glucose-Capillary: 147 mg/dL — ABNORMAL HIGH (ref 70–99)

## 2022-12-24 LAB — MAGNESIUM: Magnesium: 2.3 mg/dL (ref 1.7–2.4)

## 2022-12-24 MED ORDER — POTASSIUM CHLORIDE CRYS ER 20 MEQ PO TBCR
30.0000 meq | EXTENDED_RELEASE_TABLET | Freq: Once | ORAL | Status: AC
  Administered 2022-12-24: 30 meq via ORAL
  Filled 2022-12-24: qty 1

## 2022-12-24 MED ORDER — ATORVASTATIN CALCIUM 80 MG PO TABS
80.0000 mg | ORAL_TABLET | Freq: Every day | ORAL | Status: DC
  Administered 2022-12-24 – 2022-12-31 (×7): 80 mg via ORAL
  Filled 2022-12-24 (×7): qty 1

## 2022-12-24 MED ORDER — EZETIMIBE 10 MG PO TABS
10.0000 mg | ORAL_TABLET | Freq: Every day | ORAL | Status: DC
  Filled 2022-12-24: qty 1

## 2022-12-24 NOTE — Progress Notes (Signed)
PROGRESS NOTE    [name]  ZOX:[id number] DOB: 1931/01/16 DOA: 12/22/2022 PCP: System, Provider Not In    Brief Narrative:   [name] is a 89 y.o. female with past medical history significant for DM2, HTN, chronic diastolic congestive heart failure, complete heart block s/p PPM, pulmonary hypertension who presented to [facility] ED via EMS from wound care clinic with left great toe wound.  Patient was seen in the clinic by Dr. Celine Ahr with notable oozing frank pus from her left great toe and was referred to the ED for potential need of amputation and angiography.  Patient denies fever/chills, no nausea/vomiting/diarrhea, no abdominal pain, no chest pain, no palpitations, no urinary symptoms.  In the ED, temperature 98.0 F, HR 87, RR 13, BP 147/71, SpO2 94% on room air.  WBC 10.6, hemoglobin 13.5, platelets 342.  Sodium 131, potassium 2.8, chloride 88, CO2 31, glucose 128, BUN 50, creatinine 1.10.  AST 27, ALT 10, total bilirubin 0.5.  CRP 2.7, ESR 86.  Left foot x-ray with no evidence of osteomyelitis, marked soft tissue swelling of the foot, pes planus deformity and collapse of the talus possibly Charcot arthropathy.  Vascular surgery was consulted.  EDP repleted potassium and magnesium.  TRH consulted for admission for further evaluation and management of left great toe gangrene, hyponatremia, hypokalemia.  Assessment & Plan:   Left great toe gangrene 2/2 severe PAD Patient presenting to the ED as a referral from the wound care clinic for left great toe gangrene with associated purulence.  Patient is afebrile but with slight leukocytosis of 10.6 on admission.  Elevated CRP/ESR markers.  X-ray with no evidence of osteomyelitis but with marked soft tissue swelling of the foot.  Vascular surgery was consulted and patient underwent aortogram, left lower extremity angiogram with findings of occlusion of popliteal artery, anterior tibial artery, tibioperoneal trunk, peroneal artery, posterior  tibial artery, pedal circulation with no options for revascularization. -- Vascular surgery on, appreciate assistance -- Recommendations are for AKA, patient and family to decide whether to proceed -- Atorvastatin 80 mg p.o. daily -- Gabapentin 300 mg p.o. twice daily -- Norco 5-325 mg p.o. every 6 hours as needed moderate pain -- Further per vascular surgery  Hyponatremia: Resolved Sodium 131 on admission, likely hypovolemic hyponatremia in the setting of poor oral intake in the days preceding hospitalization. -- Na 131>>136>136 -- Continue to hold home furosemide/metolazone for now -- BMP in am  Hypokalemia: Resolved Potassium 2.8 on admission.  Repleted. -- K 2.8>>3.8>3.7 -- DC IV fluids today  Dyslipidemia Lipid panel with total cholesterol 173, HDL 31, LDL 118, triglycerides 122. -- Started on atorvastatin 80 mg p.o. daily -- Repeat lipid panel 8-12 weeks  Type 2 diabetes mellitus Hemoglobin A1c 7.0, at goal.  Home regimen includes Januvia 50 mg p.o. daily. -- Continue Tradjenta as hospital substitution -- SSI for coverage -- CBGs qAC/HS  Essential hypertension Chronic diastolic congestive heart failure Home regimen includes carvedilol 6.25 mg p.o. twice daily, furosemide 80 mg p.o. daily, metolazone 2.5 mg on M/T/W/T. -- Carvedilol 6.25 mg p.o. twice daily -- Holding home furosemide and metolazone due to hyponatremia, volume depletion as above -- Continue monitor BP closely -- Outpatient follow-up with cardiology  Hx complete heart block s/p PPM -- Continue monitor on telemetry -- Outpatient follow-up with cardiology  Generalized weakness/debility/gait disturbance: Current resides at [facility] consulted for assistance when patient medically cleared for discharge  Morbid obesity Body mass index is 38.79 kg/m.  Discussed with patient needs for aggressive lifestyle changes/weight loss as this complicates all facets of care.  Outpatient follow-up with  PCP.     DVT prophylaxis: SCD's Start: 12/23/22 1238 enoxaparin (LOVENOX) injection 40 mg Start: 12/22/22 2030    Code Status: Full Code Family Communication: Updated son present at bedside this morning  Disposition Plan:  Level of care: Progressive Cardiac Status is: Inpatient Remains inpatient appropriate because: Pending angiography/possible surgical invention by vascular surgery    Consultants:  Vascular surgery, Dr. Lenell Antu  Procedures:  Abdominal aortogram with lower extremity runoff; 2/2, Dr. Lenell Antu  Antimicrobials:  none   Subjective: Patient seen examined bedside, resting comfortably.  Lying in bed.  Complaining of pain to left great toe.  Sister present at bedside.  Underwent aortogram with angiogram left lower extremity yesterday with complete occlusion below her left knee with recommendations of AKA.  Patient continues to contemplate whether to pursue surgery, seen by vascular surgery tomorrow with hopeful for decision by tomorrow.  No other specific complaints or concerns at this time.  Denies headache, no dizziness, no chest pain, no shortness of breath, no abdominal pain, no focal weakness, no fatigue, no paresthesias.  No acute events overnight per nursing staff.  Objective: Vitals:   12/24/22 0000 12/24/22 0614 12/24/22 0827 12/24/22 0900  BP: 110/64 (!) 130/52 132/64   Pulse:  77 83   Resp: 14 18 (!) 22 18  Temp:  97.6 F (36.4 C) 97.8 F (36.6 C)   TempSrc:  Oral Oral   SpO2:  100% 99%   Weight:      Height:        Intake/Output Summary (Last 24 hours) at 12/24/2022 1105 Last data filed at 12/24/2022 4332 Gross per 24 hour  Intake 1723.85 ml  Output 1100 ml  Net 623.85 ml   Filed Weights   12/22/22 1430 12/22/22 2333  Weight: 92.4 kg 90.1 kg    Examination:  Physical Exam: GEN: NAD, alert and oriented x 3, early in appearance, obese HEENT: NCAT, PERRL, EOMI, sclera clear, MMM PULM: CTAB w/o wheezes/crackles, normal respiratory effort, on room  air CV: RRR w/o M/G/R GI: abd soft, NTND, NABS, no R/G/M MSK: Right foot edema, mild tenderness distal aspect left foot without warmth, noted black discoloration tip left great toe, moves all extremities independently, NEURO: CN II-XII intact, no focal deficits, sensation to light touch intact PSYCH: normal mood/affect Integumentary: Left foot as above, otherwise no other concerning rashes/lesions/wounds noted on exposed skin surfaces     Data Reviewed: I have personally reviewed following labs and imaging studies  CBC: Recent Labs  Lab 12/22/22 1534 12/22/22 2351 12/23/22 0747 12/23/22 1321 12/24/22 0119  WBC 10.6* 10.6* 9.5 9.3 9.7  NEUTROABS 8.2*  --   --   --   --   HGB 13.5 13.4 13.1 13.2 11.1*  HCT 41.8 39.2 39.6 40.0 34.6*  MCV 88.7 86.0 88.2 88.1 90.1  PLT 342 315 322 312 300   Basic Metabolic Panel: Recent Labs  Lab 12/22/22 1534 12/22/22 2006 12/22/22 2351 12/23/22 0747 12/23/22 1321 12/24/22 0119  NA 131* 131* 132* 136  --  136  K 2.8* 3.1* 2.9* 3.8  --  3.7  CL 88* 90* 91* 97*  --  105  CO2 31 26 29 28   --  25  GLUCOSE 128* 119* 212* 121*  --  111*  BUN 50* 49* 51* 45*  --  33*  CREATININE 1.10* 1.05* 1.10* 0.98 0.97 1.02*  CALCIUM  9.8 9.6 9.4 9.5  --  8.6*  MG  --   --   --  2.6*  --  2.3   GFR: Estimated Creatinine Clearance: 37.4 mL/min (A) (by C-G formula based on SCr of 1.02 mg/dL (H)). Liver Function Tests: Recent Labs  Lab 12/22/22 1534 12/22/22 2006  AST 27 26  ALT 10 9  ALKPHOS 87 76  BILITOT 0.5 0.6  PROT 8.2* 7.7  ALBUMIN 3.5 3.3*   No results for input(s): "LIPASE", "AMYLASE" in the last 168 hours. No results for input(s): "AMMONIA" in the last 168 hours. Coagulation Profile: No results for input(s): "INR", "PROTIME" in the last 168 hours. Cardiac Enzymes: No results for input(s): "CKTOTAL", "CKMB", "CKMBINDEX", "TROPONINI" in the last 168 hours. BNP (last 3 results) No results for input(s): "PROBNP" in the last 8760  hours. HbA1C: Recent Labs    12/22/22 2351  HGBA1C 7.0*   CBG: Recent Labs  Lab 12/23/22 1211 12/23/22 1613 12/23/22 2047 12/24/22 0616 12/24/22 0823  GLUCAP 104* 268* 159* 102* 102*   Lipid Profile: Recent Labs    12/24/22 0119  CHOL 173  HDL 31*  LDLCALC 118*  TRIG 122  CHOLHDL 5.6   Thyroid Function Tests: No results for input(s): "TSH", "T4TOTAL", "FREET4", "T3FREE", "THYROIDAB" in the last 72 hours. Anemia Panel: No results for input(s): "VITAMINB12", "FOLATE", "FERRITIN", "TIBC", "IRON", "RETICCTPCT" in the last 72 hours. Sepsis Labs: No results for input(s): "PROCALCITON", "LATICACIDVEN" in the last 168 hours.  No results found for this or any previous visit (from the past 240 hour(s)).       Radiology Studies: PERIPHERAL VASCULAR CATHETERIZATION  Result Date: 12/23/2022 Table formatting from the original result was not included. DATE OF SERVICE: 12/23/2022  PATIENT:  [name]  89 y.o. female  PRE-OPERATIVE DIAGNOSIS:  Atherosclerosis of native arteries of left lower extremity causing gangrene  POST-OPERATIVE DIAGNOSIS:  Same  PROCEDURE:  1) Ultrasound guided right common femoral artery access 2) Aortogram 3) Left lower extremity angiogram with second order cannulation 4) Conscious sedation (43 minutes)   SURGEON:  [name]. Stanford Breed, MD  ASSISTANT: none  ANESTHESIA:   local and IV sedation  ESTIMATED BLOOD LOSS: minimal  LOCAL MEDICATIONS USED:  LIDOCAINE  COUNTS: confirmed correct.  PATIENT DISPOSITION:  PACU - hemodynamically stable.  Delay start of Pharmacological VTE agent (>24hrs) due to surgical blood loss or risk of bleeding: no  INDICATION FOR PROCEDURE: [name] is a 89 y.o. female with left foot gangrene and known history of peripheral arterial disease. After careful discussion of risks, benefits, and alternatives the patient was offered angiography. The patient understood and wished to proceed.  OPERATIVE FINDINGS: Terminal aorta and iliac  arteries: Tortuous but widely patent  Left lower extremity: Common femoral artery: widely patent Profunda femoris artery: widely patent Superficial femoral artery: diffuse, severe disease proximally. Occludes mid thigh Popliteal artery: occluded Anterior tibial artery: occluded Tibioperoneal trunk: occluded Peroneal artery: occluded Posterior tibial artery: occluded Pedal circulation: occluded  DESCRIPTION OF PROCEDURE: After identification of the patient in the pre-operative holding area, the patient was transferred to the operating room. The patient was positioned supine on the operating room table.  Anesthesia was induced. The groins was prepped and draped in standard fashion. A surgical pause was performed confirming correct patient, procedure, and operative location.  The right groin was anesthetized with subcutaneous injection of 1% lidocaine. Using ultrasound guidance, the right common femoral artery was accessed with micropuncture technique. Fluoroscopy was used to confirm  cannulation over the femoral head. The 43F sheath was upsized to 17F.  A Benson wire was advanced into the distal aorta. Over the wire an omni flush catheter was advanced to the level of L2. Aortogram was performed - see above for details.  The left common iliac artery was selected with an omniflush catheter and glidewire guidewire. The wire was advanced into the common femoral artery. Over the wire the omni flush catheter was advanced into the external iliac artery. Selective angiography was performed - see above for details.  A mynx device was used to close the arteriotomy. Hemostasis was excellent upon completion.  Conscious sedation was administered with the use of IV fentanyl and midazolam under continuous physician and nurse monitoring.  Heart rate, blood pressure, and oxygen saturation were continuously monitored.  Total sedation time was 43 minutes  Upon completion of the case instrument and sharps counts were confirmed correct. The  patient was transferred to the PACU in good condition. I was present for all portions of the procedure.  PLAN: No options for revascularization. All of patient's named arteries below the thigh are occluded. Recommend palliative care evaluation. Her only surgical options is an above knee amputation.  [name]. Stanford Breed, MD [facility] Phone Number: [phone] 12/23/2022 12:14 PM   DG Foot Complete Left  Result Date: 12/22/2022 CLINICAL DATA:  Concern for osteomyelitis of the great toe. EXAM: LEFT FOOT - COMPLETE 3+ VIEW COMPARISON:  None Available. FINDINGS: There is diffuse osteopenia. There is marked soft tissue swelling of the foot. There is no radiopaque foreign body. Evidence for fracture or dislocation. No cortical erosions are seen. Ulceration is seen overlying the plantar distal aspect of the first toe. There is pes planus deformity and collapse of the talus, possibly Charcot arthropathy. IMPRESSION: 1. No evidence of osteomyelitis. 2. Marked soft tissue swelling of the foot. 3. Pes planus deformity and collapse of the talus, possibly Charcot arthropathy. Electronically Signed   By: Ronney Asters M.D.   On: 12/22/2022 15:31        Scheduled Meds:  atorvastatin  80 mg Oral Daily   carvedilol  6.25 mg Oral BID WC   cholecalciferol  2,000 Units Oral q AM   enoxaparin (LOVENOX) injection  40 mg Subcutaneous Q24H   gabapentin  300 mg Oral BID   insulin aspart  0-9 Units Subcutaneous TID WC   linagliptin  5 mg Oral Daily   melatonin  3 mg Oral QHS   polyethylene glycol  17 g Oral Daily   sodium chloride flush  3 mL Intravenous Q12H   Continuous Infusions:  sodium chloride     0.9 % NaCl with KCl 20 mEq / L 100 mL/hr at 12/23/22 2034     LOS: 2 days    Time spent: 52 minutes spent on chart review, discussion with nursing staff, consultants, updating family and interview/physical exam; more than 50% of that time was spent in counseling and/or  coordination of care.    Kinleigh Nault J [place] DO [facility] Available via Epic secure chat 7am-7pm After these hours, please refer to coverage provider listed on [email or web address] 12/24/2022, 11:05 AM

## 2022-12-24 NOTE — Progress Notes (Signed)
PHARMACIST LIPID MONITORING   [name] is a 89 y.o. female admitted on 12/22/2022 with left toe gangrene.  Pharmacy has been consulted to optimize lipid-lowering therapy with the indication of secondary prevention for clinical ASCVD.  Recent Labs:  Lipid Panel (last 6 months):   Lab Results  Component Value Date   CHOL 173 12/24/2022   TRIG 122 12/24/2022   HDL 31 (L) 12/24/2022   CHOLHDL 5.6 12/24/2022   VLDL 24 12/24/2022   LDLCALC 118 (H) 12/24/2022    Hepatic function panel (last 6 months):   Lab Results  Component Value Date   AST 26 12/22/2022   ALT 9 12/22/2022   ALKPHOS 76 12/22/2022   BILITOT 0.6 12/22/2022    SCr (since admission):   Serum creatinine: 1.02 mg/dL (H) 12/24/22 0119 Estimated creatinine clearance: 37.4 mL/min (A)  Current therapy and lipid therapy tolerance Current lipid-lowering therapy: atorvastatin 40 mg PO daily  Previous lipid-lowering therapies (if applicable): n/a Documented or reported allergies or intolerances to lipid-lowering therapies (if applicable): none   Assessment:   Patient appropriate for lipid therapy intensification.   Plan:    1.Statin intensity (high intensity recommended for all patients regardless of the LDL):  Add or increase statin to high intensity. Add atorvastatin 80 mg PO daily.   2.Add ezetimibe (if any one of the following):   On a high intensity statin with LDL > 70. Not indicated at this time. Ezetimibe 10 mg PO daily.   3.Refer to lipid clinic:   No  4.Follow-up with:  Primary care provider - System, Provider Not In  5.Follow-up labs after discharge:  Changes in lipid therapy were made. Check a lipid panel in 8-12 weeks then annually.       Gena Fray, PharmD PGY1 Pharmacy Resident   12/24/2022 9:29 AM

## 2022-12-24 NOTE — Plan of Care (Signed)
  Problem: Nutritional: Goal: Maintenance of adequate nutrition will improve Outcome: Progressing   Problem: Cardiovascular: Goal: Ability to achieve and maintain adequate cardiovascular perfusion will improve Outcome: Progressing

## 2022-12-24 NOTE — Progress Notes (Addendum)
  Progress Note    12/24/2022 8:03 AM 1 Day Post-Op  Subjective:  says she had a rough night. Daughter present in room as well. Patient had a lot of pain in left leg overnight   Vitals:   12/24/22 0000 12/24/22 0614  BP: 110/64 (!) 130/52  Pulse:  77  Resp: 14 18  Temp:  97.6 F (36.4 C)  SpO2:  100%   Physical Exam: Cardiac:  regular Lungs:  non  labored Extremities:  right CF access site c/d/I without swelling or hematoma. Dry gangrene of left great toe Abdomen:  obese Neurologic: alert and oriented  CBC    Component Value Date/Time   WBC 9.7 12/24/2022 0119   RBC 3.84 (L) 12/24/2022 0119   HGB 11.1 (L) 12/24/2022 0119   HGB 12.2 12/10/2021 1241   HCT 34.6 (L) 12/24/2022 0119   HCT 36.9 12/10/2021 1241   PLT 300 12/24/2022 0119   PLT 290 12/10/2021 1241   MCV 90.1 12/24/2022 0119   MCV 89 12/10/2021 1241   MCH 28.9 12/24/2022 0119   MCHC 32.1 12/24/2022 0119   RDW 13.9 12/24/2022 0119   RDW 13.0 12/10/2021 1241   LYMPHSABS 1.2 12/22/2022 1534   LYMPHSABS WILL FOLLOW 11/24/2016 1643   LYMPHSABS 1.5 11/24/2016 1643   MONOABS 0.8 12/22/2022 1534   EOSABS 0.3 12/22/2022 1534   EOSABS WILL FOLLOW 11/24/2016 1643   EOSABS 0.3 11/24/2016 1643   BASOSABS 0.0 12/22/2022 1534   BASOSABS WILL FOLLOW 11/24/2016 1643   BASOSABS 0.0 11/24/2016 1643    BMET    Component Value Date/Time   NA 136 12/24/2022 0119   NA 140 12/10/2021 1241   K 3.7 12/24/2022 0119   CL 105 12/24/2022 0119   CO2 25 12/24/2022 0119   GLUCOSE 111 (H) 12/24/2022 0119   BUN 33 (H) 12/24/2022 0119   BUN 34 (H) 12/10/2021 1241   CREATININE 1.02 (H) 12/24/2022 0119   CALCIUM 8.6 (L) 12/24/2022 0119   GFRNONAA 53 (L) 12/24/2022 0119   GFRAA 59 (L) 12/14/2020 1408    INR    Component Value Date/Time   INR 1.1 (H) 12/01/2011 1607     Intake/Output Summary (Last 24 hours) at 12/24/2022 0803 Last data filed at 12/23/2022 2146 Gross per 24 hour  Intake 1723.85 ml  Output 450 ml  Net  1273.85 ml     Assessment/Plan:  89 y.o. female is s/p Aortogram, Arteriogram LLE 1 Day Post-Op   In a lot of discomfort overnight from left leg Right CF access site c/d/I without swelling or hematoma Unfortunately no revascularization options for patients LLE. She has severe disease in left SFA with occlusion in mid thigh with no reconstitution of any distal vessels  She would like today and tomorrow to talk with family and decide on how she would like to proceed Her only surgical option is a left above knee amputation. Dr. Stanford Breed is available to do this as early as Monday 2/5 if patient wishes to proceed   Karoline Caldwell, PA-C [facility] [phone] 12/24/2022 8:03 AM  VASCULAR STAFF ADDENDUM: I have independently interviewed and examined the patient. I agree with the above.  Will re-engage AKA discussion tomorrow morning.   Cassandria Santee, MD [facility] Phone Number: [phone] 12/24/2022 10:05 AM

## 2022-12-25 DIAGNOSIS — E876 Hypokalemia: Secondary | ICD-10-CM | POA: Diagnosis not present

## 2022-12-25 LAB — GLUCOSE, CAPILLARY
Glucose-Capillary: 115 mg/dL — ABNORMAL HIGH (ref 70–99)
Glucose-Capillary: 114 mg/dL — ABNORMAL HIGH (ref 70–99)
Glucose-Capillary: 101 mg/dL — ABNORMAL HIGH (ref 70–99)
Glucose-Capillary: 162 mg/dL — ABNORMAL HIGH (ref 70–99)

## 2022-12-25 NOTE — Plan of Care (Signed)
  Problem: Education: Goal: Ability to describe self-care measures that may prevent or decrease complications (Diabetes Survival Skills Education) will improve Outcome: Progressing Goal: Individualized Educational Video(s) Outcome: Progressing   

## 2022-12-25 NOTE — Progress Notes (Signed)
  Progress Note    12/25/2022 8:20 AM 2 Days Post-Op  Subjective:  got somewhat better night of sleep with her left leg pain   Vitals:   12/24/22 2300 12/25/22 0342  BP: 136/76 (!) 150/50  Pulse:    Resp: 20 20  Temp: 98 F (36.7 C) 98.1 F (36.7 C)  SpO2: 97%    Physical Exam: Cardiac:  regular Lungs:  non labored Extremities:  dry gangrene of left great toe Abdomen:  obese Neurologic: alert and oriented  CBC    Component Value Date/Time   WBC 9.7 12/24/2022 0119   RBC 3.84 (L) 12/24/2022 0119   HGB 11.1 (L) 12/24/2022 0119   HGB 12.2 12/10/2021 1241   HCT 34.6 (L) 12/24/2022 0119   HCT 36.9 12/10/2021 1241   PLT 300 12/24/2022 0119   PLT 290 12/10/2021 1241   MCV 90.1 12/24/2022 0119   MCV 89 12/10/2021 1241   MCH 28.9 12/24/2022 0119   MCHC 32.1 12/24/2022 0119   RDW 13.9 12/24/2022 0119   RDW 13.0 12/10/2021 1241   LYMPHSABS 1.2 12/22/2022 1534   LYMPHSABS WILL FOLLOW 11/24/2016 1643   LYMPHSABS 1.5 11/24/2016 1643   MONOABS 0.8 12/22/2022 1534   EOSABS 0.3 12/22/2022 1534   EOSABS WILL FOLLOW 11/24/2016 1643   EOSABS 0.3 11/24/2016 1643   BASOSABS 0.0 12/22/2022 1534   BASOSABS WILL FOLLOW 11/24/2016 1643   BASOSABS 0.0 11/24/2016 1643    BMET    Component Value Date/Time   NA 136 12/24/2022 0119   NA 140 12/10/2021 1241   K 3.7 12/24/2022 0119   CL 105 12/24/2022 0119   CO2 25 12/24/2022 0119   GLUCOSE 111 (H) 12/24/2022 0119   BUN 33 (H) 12/24/2022 0119   BUN 34 (H) 12/10/2021 1241   CREATININE 1.02 (H) 12/24/2022 0119   CALCIUM 8.6 (L) 12/24/2022 0119   GFRNONAA 53 (L) 12/24/2022 0119   GFRAA 59 (L) 12/14/2020 1408    INR    Component Value Date/Time   INR 1.1 (H) 12/01/2011 1607     Intake/Output Summary (Last 24 hours) at 12/25/2022 0820 Last data filed at 12/25/2022 0553 Gross per 24 hour  Intake 1360 ml  Output 1750 ml  Net -390 ml     Assessment/Plan:  89 y.o. female is s/p Aortogram, Arteriogram LLE 2 Days Post-Op    Pain control PRN Right CF access site c/d/I without swelling or hematoma. Dressing removed Unfortunately no revascularization options for patients LLE. She has severe disease in left SFA with occlusion in mid thigh with no reconstitution of any distal vessels  She would like to talk further with family this afternoon regarding amputation but expresses her wishes to likely proceed with left AKA She will let staff know when she has made decision so that we can arrange operative time   [name] Caldwell, PA-C [facility] [phone] 12/25/2022 8:20 AM

## 2022-12-25 NOTE — Progress Notes (Signed)
PROGRESS NOTE    [name]  NWG:[id number] DOB: [birth year] DOA: 12/22/2022 PCP: System, Provider Not In    Brief Narrative:   [name] is a 89 y.o. female with past medical history significant for DM2, HTN, chronic diastolic congestive heart failure, complete heart block s/p PPM, pulmonary hypertension who presented to [facility] ED via EMS from wound care clinic with left great toe wound.  Patient was seen in the clinic by Dr. Lady Gary with notable oozing frank pus from her left great toe and was referred to the ED for potential need of amputation and angiography.  Patient denies fever/chills, no nausea/vomiting/diarrhea, no abdominal pain, no chest pain, no palpitations, no urinary symptoms.  In the ED, temperature 98.0 F, HR 87, RR 13, BP 147/71, SpO2 94% on room air.  WBC 10.6, hemoglobin 13.5, platelets 342.  Sodium 131, potassium 2.8, chloride 88, CO2 31, glucose 128, BUN 50, creatinine 1.10.  AST 27, ALT 10, total bilirubin 0.5.  CRP 2.7, ESR 86.  Left foot x-ray with no evidence of osteomyelitis, marked soft tissue swelling of the foot, pes planus deformity and collapse of the talus possibly Charcot arthropathy.  Vascular surgery was consulted.  EDP repleted potassium and magnesium.  TRH consulted for admission for further evaluation and management of left great toe gangrene, hyponatremia, hypokalemia.  Assessment & Plan:   Left great toe gangrene 2/2 severe PAD Patient presenting to the ED as a referral from the wound care clinic for left great toe gangrene with associated purulence.  Patient is afebrile but with slight leukocytosis of 10.6 on admission.  Elevated CRP/ESR markers.  X-ray with no evidence of osteomyelitis but with marked soft tissue swelling of the foot.  Vascular surgery was consulted and patient underwent aortogram, left lower extremity angiogram with findings of occlusion of popliteal artery, anterior tibial artery, tibioperoneal trunk, peroneal artery, posterior  tibial artery, pedal circulation with no options for revascularization. -- Vascular surgery on, appreciate assistance -- Recommendations are for AKA, patient and family to decide whether to proceed -- Atorvastatin 80 mg p.o. daily -- Gabapentin 300 mg p.o. twice daily -- Norco 5-325 mg p.o. every 6 hours as needed moderate pain -- Further per vascular surgery  Hyponatremia: Resolved Sodium 131 on admission, likely hypovolemic hyponatremia in the setting of poor oral intake in the days preceding hospitalization. -- Na 131>>136>136 -- Continue to hold home furosemide/metolazone for now -- BMP in am  Hypokalemia: Resolved Potassium 2.8 on admission.  Repleted. -- K 2.8>>3.8>3.7 -- BMP in the a.m.  Dyslipidemia Lipid panel with total cholesterol 173, HDL 31, LDL 118, triglycerides 122. -- Started on atorvastatin 80 mg p.o. daily -- Repeat lipid panel 8-12 weeks  Type 2 diabetes mellitus Hemoglobin A1c 7.0, at goal.  Home regimen includes Januvia 50 mg p.o. daily. -- Continue Tradjenta as hospital substitution -- SSI for coverage -- CBGs qAC/HS  Essential hypertension Chronic diastolic congestive heart failure Home regimen includes carvedilol 6.25 mg p.o. twice daily, furosemide 80 mg p.o. daily, metolazone 2.5 mg on M/T/W/T. -- Carvedilol 6.25 mg p.o. twice daily -- Holding home furosemide and metolazone due to hyponatremia, volume depletion as above -- Continue monitor BP closely -- Outpatient follow-up with cardiology  Hx complete heart block s/p PPM -- Continue monitor on telemetry -- Outpatient follow-up with cardiology  Generalized weakness/debility/gait disturbance: Current resides at [facility]. -- [facility] consulted for assistance when patient medically cleared for discharge  Morbid obesity Body mass index is 38.79 kg/m.  Discussed with patient needs for aggressive lifestyle changes/weight loss as this complicates all facets of care.  Outpatient follow-up with  PCP.     DVT prophylaxis: SCD's Start: 12/23/22 1238 enoxaparin (LOVENOX) injection 40 mg Start: 12/22/22 2030    Code Status: Full Code Family Communication: No family present at bedside this morning, updated sister present yesterday afternoon  Disposition Plan:  Level of care: Med-Surg Status is: Inpatient Remains inpatient appropriate because: Pending determination of proceeding with left above-knee amputation; anticipate return back to [facility] after    Consultants:  Vascular surgery, Dr. Stanford Breed  Procedures:  Abdominal aortogram with lower extremity runoff; 2/2, Dr. Stanford Breed  Antimicrobials:  none   Subjective: Patient seen examined bedside, resting comfortably.  Lying in bed.  No family present at bedside this morning.  Continues to contemplate whether proceeding with amputation, will further discuss with family at this afternoon but apparently leaning towards proceeding with surgery.  Seen by vascular surgery this morning.  No specific complaints or concerns at this time.  Denies headache, no dizziness, no chest pain, no shortness of breath, no abdominal pain, no focal weakness, no fatigue, no paresthesias.  No acute events overnight per nursing staff.  Objective: Vitals:   12/24/22 1934 12/24/22 2300 12/25/22 0342 12/25/22 0900  BP: 111/76 136/76 (!) 150/50 (!) 143/89  Pulse: 83     Resp:  20 20 18   Temp: 98.2 F (36.8 C) 98 F (36.7 C) 98.1 F (36.7 C) 98.7 F (37.1 C)  TempSrc: Oral Oral Oral Oral  SpO2: 99% 97%    Weight:      Height:        Intake/Output Summary (Last 24 hours) at 12/25/2022 1023 Last data filed at 12/25/2022 0553 Gross per 24 hour  Intake 880 ml  Output 1100 ml  Net -220 ml   Filed Weights   12/22/22 1430 12/22/22 2333  Weight: 92.4 kg 90.1 kg    Examination:  Physical Exam: GEN: NAD, alert and oriented x 3, early in appearance, obese HEENT: NCAT, PERRL, EOMI, sclera clear, MMM PULM: CTAB w/o wheezes/crackles, normal  respiratory effort, on room air CV: RRR w/o M/G/R GI: abd soft, NTND, NABS, no R/G/M MSK: Right foot edema, mild tenderness distal aspect left foot without warmth, noted black discoloration tip left great toe, moves all extremities independently, NEURO: CN II-XII intact, no focal deficits, sensation to light touch intact PSYCH: normal mood/affect Integumentary: Left foot as above, otherwise no other concerning rashes/lesions/wounds noted on exposed skin surfaces     Data Reviewed: I have personally reviewed following labs and imaging studies  CBC: Recent Labs  Lab 12/22/22 1534 12/22/22 2351 12/23/22 0747 12/23/22 1321 12/24/22 0119  WBC 10.6* 10.6* 9.5 9.3 9.7  NEUTROABS 8.2*  --   --   --   --   HGB 13.5 13.4 13.1 13.2 11.1*  HCT 41.8 39.2 39.6 40.0 34.6*  MCV 88.7 86.0 88.2 88.1 90.1  PLT 342 315 322 312 202   Basic Metabolic Panel: Recent Labs  Lab 12/22/22 1534 12/22/22 2006 12/22/22 2351 12/23/22 0747 12/23/22 1321 12/24/22 0119  NA 131* 131* 132* 136  --  136  K 2.8* 3.1* 2.9* 3.8  --  3.7  CL 88* 90* 91* 97*  --  105  CO2 31 26 29 28   --  25  GLUCOSE 128* 119* 212* 121*  --  111*  BUN 50* 49* 51* 45*  --  33*  CREATININE 1.10* 1.05* 1.10* 0.98 0.97  1.02*  CALCIUM 9.8 9.6 9.4 9.5  --  8.6*  MG  --   --   --  2.6*  --  2.3   GFR: Estimated Creatinine Clearance: 37.4 mL/min (A) (by C-G formula based on SCr of 1.02 mg/dL (H)). Liver Function Tests: Recent Labs  Lab 12/22/22 1534 12/22/22 2006  AST 27 26  ALT 10 9  ALKPHOS 87 76  BILITOT 0.5 0.6  PROT 8.2* 7.7  ALBUMIN 3.5 3.3*   No results for input(s): "LIPASE", "AMYLASE" in the last 168 hours. No results for input(s): "AMMONIA" in the last 168 hours. Coagulation Profile: No results for input(s): "INR", "PROTIME" in the last 168 hours. Cardiac Enzymes: No results for input(s): "CKTOTAL", "CKMB", "CKMBINDEX", "TROPONINI" in the last 168 hours. BNP (last 3 results) No results for input(s):  "PROBNP" in the last 8760 hours. HbA1C: Recent Labs    12/22/22 2351  HGBA1C 7.0*   CBG: Recent Labs  Lab 12/24/22 0823 12/24/22 1206 12/24/22 1605 12/24/22 2035 12/25/22 0549  GLUCAP 102* 105* 143* 147* 115*   Lipid Profile: Recent Labs    12/24/22 0119  CHOL 173  HDL 31*  LDLCALC 118*  TRIG 122  CHOLHDL 5.6   Thyroid Function Tests: No results for input(s): "TSH", "T4TOTAL", "FREET4", "T3FREE", "THYROIDAB" in the last 72 hours. Anemia Panel: No results for input(s): "VITAMINB12", "FOLATE", "FERRITIN", "TIBC", "IRON", "RETICCTPCT" in the last 72 hours. Sepsis Labs: No results for input(s): "PROCALCITON", "LATICACIDVEN" in the last 168 hours.  No results found for this or any previous visit (from the past 240 hour(s)).       Radiology Studies: PERIPHERAL VASCULAR CATHETERIZATION  Result Date: 12/23/2022 Table formatting from the original result was not included. DATE OF SERVICE: 12/23/2022  PATIENT:  [name]  89 y.o. female  PRE-OPERATIVE DIAGNOSIS:  Atherosclerosis of native arteries of left lower extremity causing gangrene  POST-OPERATIVE DIAGNOSIS:  Same  PROCEDURE:  1) Ultrasound guided right common femoral artery access 2) Aortogram 3) Left lower extremity angiogram with second order cannulation 4) Conscious sedation (43 minutes)   SURGEON:  [name]. Stanford Breed, MD  ASSISTANT: none  ANESTHESIA:   local and IV sedation  ESTIMATED BLOOD LOSS: minimal  LOCAL MEDICATIONS USED:  LIDOCAINE  COUNTS: confirmed correct.  PATIENT DISPOSITION:  PACU - hemodynamically stable.  Delay start of Pharmacological VTE agent (>24hrs) due to surgical blood loss or risk of bleeding: no  INDICATION FOR PROCEDURE: SHERISA GILVIN is a 89 y.o. female with left foot gangrene and known history of peripheral arterial disease. After careful discussion of risks, benefits, and alternatives the patient was offered angiography. The patient understood and wished to proceed.  OPERATIVE FINDINGS:  Terminal aorta and iliac arteries: Tortuous but widely patent  Left lower extremity: Common femoral artery: widely patent Profunda femoris artery: widely patent Superficial femoral artery: diffuse, severe disease proximally. Occludes mid thigh Popliteal artery: occluded Anterior tibial artery: occluded Tibioperoneal trunk: occluded Peroneal artery: occluded Posterior tibial artery: occluded Pedal circulation: occluded  DESCRIPTION OF PROCEDURE: After identification of the patient in the pre-operative holding area, the patient was transferred to the operating room. The patient was positioned supine on the operating room table.  Anesthesia was induced. The groins was prepped and draped in standard fashion. A surgical pause was performed confirming correct patient, procedure, and operative location.  The right groin was anesthetized with subcutaneous injection of 1% lidocaine. Using ultrasound guidance, the right common femoral artery was accessed with micropuncture technique. Fluoroscopy was  used to confirm cannulation over the femoral head. The 38F sheath was upsized to 31F.  A Benson wire was advanced into the distal aorta. Over the wire an omni flush catheter was advanced to the level of L2. Aortogram was performed - see above for details.  The left common iliac artery was selected with an omniflush catheter and glidewire guidewire. The wire was advanced into the common femoral artery. Over the wire the omni flush catheter was advanced into the external iliac artery. Selective angiography was performed - see above for details.  A mynx device was used to close the arteriotomy. Hemostasis was excellent upon completion.  Conscious sedation was administered with the use of IV fentanyl and midazolam under continuous physician and nurse monitoring.  Heart rate, blood pressure, and oxygen saturation were continuously monitored.  Total sedation time was 43 minutes  Upon completion of the case instrument and sharps counts  were confirmed correct. The patient was transferred to the PACU in good condition. I was present for all portions of the procedure.  PLAN: No options for revascularization. All of patient's named arteries below the thigh are occluded. Recommend palliative care evaluation. Her only surgical options is an above knee amputation.  [name]. Stanford Breed, MD [facility] Phone Number: [phone] 12/23/2022 12:14 PM        Scheduled Meds:  atorvastatin  80 mg Oral Daily   carvedilol  6.25 mg Oral BID WC   cholecalciferol  2,000 Units Oral q AM   enoxaparin (LOVENOX) injection  40 mg Subcutaneous Q24H   gabapentin  300 mg Oral BID   insulin aspart  0-9 Units Subcutaneous TID WC   linagliptin  5 mg Oral Daily   melatonin  3 mg Oral QHS   polyethylene glycol  17 g Oral Daily   sodium chloride flush  3 mL Intravenous Q12H   Continuous Infusions:  sodium chloride       LOS: 3 days    Time spent: 52 minutes spent on chart review, discussion with nursing staff, consultants, updating family and interview/physical exam; more than 50% of that time was spent in counseling and/or coordination of care.    Shirleyann Montero J [place] DO [facility] Available via Epic secure chat 7am-7pm After these hours, please refer to coverage provider listed on [email or web address] 12/25/2022, 10:23 AM

## 2022-12-25 NOTE — Progress Notes (Signed)
Pt and pt's family requested that vascular surgeon on call come by the room as patient had made decision to proceed with surgery. Dr. Virl Cagey is on call, but unavailable to come to the bedside as he is performing emergency surgery at [facility]. Gave verbal orders for NPO at midnight. Dr. Virl Cagey stated that he is unsure of what time surgery would be and would not know until probably late tonight or early AM. Patient and pt's daughter verbalized understanding.   Marten Iles M

## 2022-12-25 NOTE — Progress Notes (Signed)
Patient seen and evaluated at bedside.  She would like to pursue left-sided above-knee amputation tomorrow with Dr. Stanford Breed.  Dr. Luan Pulling OR time ends at noon and therefore this may or may not be feasible.  She is unsure whether she would want another surgeon performing the operation.  Please make n.p.o. midnight Will attempt to schedule midmorning.  Broadus John MD

## 2022-12-26 ENCOUNTER — Encounter (HOSPITAL_COMMUNITY)
Admission: EM | Disposition: A | Discharge status: Skilled Nursing Facility | Payer: Self-pay | Source: Ambulatory Visit | Attending: Internal Medicine

## 2022-12-26 ENCOUNTER — Inpatient Hospital Stay (HOSPITAL_COMMUNITY): Payer: Medicare Other | Admitting: Certified Registered Nurse Anesthetist

## 2022-12-26 ENCOUNTER — Other Ambulatory Visit: Payer: Self-pay

## 2022-12-26 ENCOUNTER — Encounter (HOSPITAL_COMMUNITY): Payer: Self-pay | Admitting: Vascular Surgery

## 2022-12-26 DIAGNOSIS — I509 Heart failure, unspecified: Secondary | ICD-10-CM

## 2022-12-26 DIAGNOSIS — I110 Hypertensive heart disease with heart failure: Secondary | ICD-10-CM

## 2022-12-26 DIAGNOSIS — I998 Other disorder of circulatory system: Secondary | ICD-10-CM

## 2022-12-26 DIAGNOSIS — F419 Anxiety disorder, unspecified: Secondary | ICD-10-CM | POA: Diagnosis not present

## 2022-12-26 DIAGNOSIS — E876 Hypokalemia: Secondary | ICD-10-CM | POA: Diagnosis not present

## 2022-12-26 HISTORY — PX: APPLICATION OF WOUND VAC: SHX5189

## 2022-12-26 HISTORY — PX: AMPUTATION: SHX166

## 2022-12-26 LAB — CBC
HCT: 32.9 % — ABNORMAL LOW (ref 36.0–46.0)
Hemoglobin: 10.9 g/dL — ABNORMAL LOW (ref 12.0–15.0)
MCH: 29.4 pg (ref 26.0–34.0)
MCHC: 33.1 g/dL (ref 30.0–36.0)
MCV: 88.7 fL (ref 80.0–100.0)
Platelets: 282 10*3/uL (ref 150–400)
RBC: 3.71 MIL/uL — ABNORMAL LOW (ref 3.87–5.11)
RDW: 13.9 % (ref 11.5–15.5)
WBC: 9.8 10*3/uL (ref 4.0–10.5)
nRBC: 0 % (ref 0.0–0.2)

## 2022-12-26 LAB — GLUCOSE, CAPILLARY
Glucose-Capillary: 80 mg/dL (ref 70–99)
Glucose-Capillary: 92 mg/dL (ref 70–99)
Glucose-Capillary: 90 mg/dL (ref 70–99)
Glucose-Capillary: 142 mg/dL — ABNORMAL HIGH (ref 70–99)
Glucose-Capillary: 177 mg/dL — ABNORMAL HIGH (ref 70–99)

## 2022-12-26 LAB — BASIC METABOLIC PANEL
Anion gap: 8 (ref 5–15)
BUN: 17 mg/dL (ref 8–23)
CO2: 26 mmol/L (ref 22–32)
Calcium: 8.9 mg/dL (ref 8.9–10.3)
Chloride: 101 mmol/L (ref 98–111)
Creatinine, Ser: 0.92 mg/dL (ref 0.44–1.00)
GFR, Estimated: 60 mL/min — ABNORMAL LOW (ref >60)
Glucose, Bld: 82 mg/dL (ref 70–99)
Potassium: 3.6 mmol/L (ref 3.5–5.1)
Sodium: 135 mmol/L (ref 135–145)

## 2022-12-26 LAB — MAGNESIUM: Magnesium: 1.9 mg/dL (ref 1.7–2.4)

## 2022-12-26 SURGERY — AMPUTATION, ABOVE KNEE
Anesthesia: General | Site: Leg Upper | Laterality: Left

## 2022-12-26 MED ORDER — DOCUSATE SODIUM 100 MG PO CAPS
100.0000 mg | ORAL_CAPSULE | Freq: Every day | ORAL | Status: DC
  Administered 2022-12-27 – 2022-12-29 (×3): 100 mg via ORAL
  Filled 2022-12-26 (×5): qty 1

## 2022-12-26 MED ORDER — HYDROMORPHONE HCL 1 MG/ML IJ SOLN
INTRAMUSCULAR | Status: AC
  Administered 2022-12-26: 0.5 mg via INTRAVENOUS
  Filled 2022-12-26: qty 1

## 2022-12-26 MED ORDER — BISACODYL 5 MG PO TBEC: 5.0000 mg | DELAYED_RELEASE_TABLET | Freq: Every day | ORAL | Status: DC | PRN

## 2022-12-26 MED ORDER — CEFAZOLIN SODIUM-DEXTROSE 2-3 GM-%(50ML) IV SOLR
INTRAVENOUS | Status: DC | PRN
  Administered 2022-12-26: 2 g via INTRAVENOUS

## 2022-12-26 MED ORDER — LACTATED RINGERS IV SOLN: INTRAVENOUS | Status: DC

## 2022-12-26 MED ORDER — ACETAMINOPHEN 10 MG/ML IV SOLN
INTRAVENOUS | Status: DC | PRN
  Administered 2022-12-26: 1000 mg via INTRAVENOUS

## 2022-12-26 MED ORDER — SODIUM CHLORIDE 0.9 % IV SOLN: INTRAVENOUS | Status: DC

## 2022-12-26 MED ORDER — PHENOL 1.4 % MT LIQD: 1.0000 | OROMUCOSAL | Status: DC | PRN

## 2022-12-26 MED ORDER — LIDOCAINE 2% (20 MG/ML) 5 ML SYRINGE
INTRAMUSCULAR | Status: DC | PRN
  Administered 2022-12-26: 60 mg via INTRAVENOUS

## 2022-12-26 MED ORDER — PROPOFOL 10 MG/ML IV BOLUS
INTRAVENOUS | Status: DC | PRN
  Administered 2022-12-26: 50 mg via INTRAVENOUS

## 2022-12-26 MED ORDER — CEFAZOLIN SODIUM-DEXTROSE 2-4 GM/100ML-% IV SOLN: 2.0000 g | Freq: Once | INTRAVENOUS | Status: DC

## 2022-12-26 MED ORDER — FENTANYL CITRATE (PF) 250 MCG/5ML IJ SOLN
INTRAMUSCULAR | Status: AC
  Filled 2022-12-26: qty 5

## 2022-12-26 MED ORDER — POTASSIUM CHLORIDE CRYS ER 20 MEQ PO TBCR: 20.0000 meq | EXTENDED_RELEASE_TABLET | Freq: Every day | ORAL | Status: DC | PRN

## 2022-12-26 MED ORDER — INSULIN ASPART 100 UNIT/ML IJ SOLN: 0.0000 [IU] | INTRAMUSCULAR | Status: DC | PRN

## 2022-12-26 MED ORDER — PHENYLEPHRINE HCL-NACL 20-0.9 MG/250ML-% IV SOLN
INTRAVENOUS | Status: DC | PRN
  Administered 2022-12-26: 25 ug/min via INTRAVENOUS

## 2022-12-26 MED ORDER — PHENYLEPHRINE 80 MCG/ML (10ML) SYRINGE FOR IV PUSH (FOR BLOOD PRESSURE SUPPORT)
PREFILLED_SYRINGE | INTRAVENOUS | Status: DC | PRN
  Administered 2022-12-26: 160 ug via INTRAVENOUS
  Administered 2022-12-26: 80 ug via INTRAVENOUS
  Administered 2022-12-26: 160 ug via INTRAVENOUS

## 2022-12-26 MED ORDER — CHLORHEXIDINE GLUCONATE 0.12 % MT SOLN: 15.0000 mL | Freq: Once | OROMUCOSAL | Status: AC

## 2022-12-26 MED ORDER — ROCURONIUM BROMIDE 10 MG/ML (PF) SYRINGE
PREFILLED_SYRINGE | INTRAVENOUS | Status: DC | PRN
  Administered 2022-12-26: 50 mg via INTRAVENOUS

## 2022-12-26 MED ORDER — 0.9 % SODIUM CHLORIDE (POUR BTL) OPTIME
TOPICAL | Status: DC | PRN
  Administered 2022-12-26: 1000 mL

## 2022-12-26 MED ORDER — ALUM & MAG HYDROXIDE-SIMETH 200-200-20 MG/5ML PO SUSP: 15.0000 mL | ORAL | Status: DC | PRN

## 2022-12-26 MED ORDER — FENTANYL CITRATE (PF) 250 MCG/5ML IJ SOLN
INTRAMUSCULAR | Status: DC | PRN
  Administered 2022-12-26 (×3): 50 ug via INTRAVENOUS

## 2022-12-26 MED ORDER — ORAL CARE MOUTH RINSE: 15.0000 mL | Freq: Once | OROMUCOSAL | Status: AC

## 2022-12-26 MED ORDER — ACETAMINOPHEN 10 MG/ML IV SOLN
INTRAVENOUS | Status: AC
  Filled 2022-12-26: qty 100

## 2022-12-26 MED ORDER — OXYCODONE HCL 5 MG PO TABS: 5.0000 mg | ORAL_TABLET | ORAL | Status: DC | PRN

## 2022-12-26 MED ORDER — SUGAMMADEX SODIUM 200 MG/2ML IV SOLN
INTRAVENOUS | Status: DC | PRN
  Administered 2022-12-26: 250 mg via INTRAVENOUS

## 2022-12-26 MED ORDER — ONDANSETRON HCL 4 MG/2ML IJ SOLN
INTRAMUSCULAR | Status: DC | PRN
  Administered 2022-12-26: 4 mg via INTRAVENOUS

## 2022-12-26 MED ORDER — HYDROCODONE-ACETAMINOPHEN 5-325 MG PO TABS
1.0000 | ORAL_TABLET | ORAL | Status: DC | PRN
  Administered 2022-12-26 (×3): 2 via ORAL
  Administered 2022-12-27: 1 via ORAL
  Administered 2022-12-27: 2 via ORAL
  Administered 2022-12-27 (×2): 1 via ORAL
  Administered 2022-12-28 – 2022-12-30 (×7): 2 via ORAL
  Filled 2022-12-26 (×8): qty 2
  Filled 2022-12-26 (×2): qty 1
  Filled 2022-12-26 (×2): qty 2
  Filled 2022-12-26: qty 1
  Filled 2022-12-26: qty 2

## 2022-12-26 MED ORDER — LORAZEPAM 2 MG/ML IJ SOLN
0.5000 mg | Freq: Four times a day (QID) | INTRAMUSCULAR | Status: DC | PRN
  Filled 2022-12-26: qty 1

## 2022-12-26 MED ORDER — PANTOPRAZOLE SODIUM 40 MG PO TBEC
40.0000 mg | DELAYED_RELEASE_TABLET | Freq: Every day | ORAL | Status: DC
  Administered 2022-12-27 – 2022-12-31 (×5): 40 mg via ORAL
  Filled 2022-12-26 (×5): qty 1

## 2022-12-26 MED ORDER — GUAIFENESIN-DM 100-10 MG/5ML PO SYRP: 15.0000 mL | ORAL_SOLUTION | ORAL | Status: DC | PRN

## 2022-12-26 MED ORDER — DEXAMETHASONE SODIUM PHOSPHATE 10 MG/ML IJ SOLN
INTRAMUSCULAR | Status: DC | PRN
  Administered 2022-12-26: 4 mg via INTRAVENOUS

## 2022-12-26 MED ORDER — SENNOSIDES-DOCUSATE SODIUM 8.6-50 MG PO TABS: 1.0000 | ORAL_TABLET | Freq: Every evening | ORAL | Status: DC | PRN

## 2022-12-26 MED ORDER — CEFAZOLIN SODIUM-DEXTROSE 2-4 GM/100ML-% IV SOLN
INTRAVENOUS | Status: AC
  Filled 2022-12-26: qty 100

## 2022-12-26 MED ORDER — PROPOFOL 10 MG/ML IV BOLUS
INTRAVENOUS | Status: AC
  Filled 2022-12-26: qty 20

## 2022-12-26 MED ORDER — CHLORHEXIDINE GLUCONATE 0.12 % MT SOLN
OROMUCOSAL | Status: AC
  Administered 2022-12-26: 15 mL via OROMUCOSAL
  Filled 2022-12-26: qty 15

## 2022-12-26 MED ORDER — METOPROLOL TARTRATE 5 MG/5ML IV SOLN: 2.0000 mg | INTRAVENOUS | Status: DC | PRN

## 2022-12-26 MED ORDER — HYDROMORPHONE HCL 1 MG/ML IJ SOLN
0.2500 mg | INTRAMUSCULAR | Status: DC | PRN
  Administered 2022-12-26: 0.5 mg via INTRAVENOUS

## 2022-12-26 MED ORDER — ALBUMIN HUMAN 5 % IV SOLN: INTRAVENOUS | Status: DC | PRN

## 2022-12-26 SURGICAL SUPPLY — 73 items
BAG COUNTER SPONGE SURGICOUNT (BAG) ×2 IMPLANT
BAG SPNG CNTER NS LX DISP (BAG) ×2
BANDAGE ESMARK 6X9 LF (GAUZE/BANDAGES/DRESSINGS) ×2 IMPLANT
BIT DRILL 5/64X5 DISP (BIT) IMPLANT
BLADE SAGITTAL (BLADE)
BLADE SAGITTAL 25.0X1.19X90 (BLADE) ×2 IMPLANT
BLADE SAW THK.89X75X18XSGTL (BLADE) IMPLANT
BLADE SURG 21 STRL SS (BLADE) ×2 IMPLANT
BNDG CMPR 9X6 STRL LF SNTH (GAUZE/BANDAGES/DRESSINGS) ×2
BNDG COHESIVE 1X5 TAN STRL LF (GAUZE/BANDAGES/DRESSINGS) IMPLANT
BNDG COHESIVE 6X5 TAN STRL LF (GAUZE/BANDAGES/DRESSINGS) ×2 IMPLANT
BNDG ELASTIC 4X5.8 VLCR STR LF (GAUZE/BANDAGES/DRESSINGS) ×2 IMPLANT
BNDG ELASTIC 6X5.8 VLCR STR LF (GAUZE/BANDAGES/DRESSINGS) ×2 IMPLANT
BNDG ESMARK 6X9 LF (GAUZE/BANDAGES/DRESSINGS) ×2
BNDG GAUZE DERMACEA FLUFF 4 (GAUZE/BANDAGES/DRESSINGS) ×2 IMPLANT
BNDG GZE DERMACEA 4 6PLY (GAUZE/BANDAGES/DRESSINGS) ×1
CANISTER SUCT 3000ML PPV (MISCELLANEOUS) ×2 IMPLANT
CANISTER WOUND CARE 500ML ATS (WOUND CARE) IMPLANT
CANISTER WOUNDNEG PRESSURE 500 (CANNISTER) IMPLANT
CHLORAPREP W/TINT 26 (MISCELLANEOUS) ×2 IMPLANT
CLIP LIGATING EXTRA MED SLVR (CLIP) IMPLANT
CLIP LIGATING EXTRA SM BLUE (MISCELLANEOUS) IMPLANT
COVER SURGICAL LIGHT HANDLE (MISCELLANEOUS) ×2 IMPLANT
CUFF TOURN SGL QUICK 24 (TOURNIQUET CUFF)
CUFF TOURN SGL QUICK 34 (TOURNIQUET CUFF)
CUFF TRNQT CYL 24X4X16.5-23 (TOURNIQUET CUFF) IMPLANT
CUFF TRNQT CYL 34X4.125X (TOURNIQUET CUFF) IMPLANT
DRAIN CHANNEL 19F RND (DRAIN) IMPLANT
DRAPE DERMATAC (DRAPES) IMPLANT
DRAPE HALF SHEET 40X57 (DRAPES) ×2 IMPLANT
DRAPE INCISE IOBAN 66X45 STRL (DRAPES) IMPLANT
DRAPE ORTHO SPLIT 77X108 STRL (DRAPES) ×4
DRAPE SURG ORHT 6 SPLT 77X108 (DRAPES) ×4 IMPLANT
DRESSING PREVENA PLUS CUSTOM (GAUZE/BANDAGES/DRESSINGS) IMPLANT
DRSG PREVENA PLUS CUSTOM (GAUZE/BANDAGES/DRESSINGS) ×2
ELECT CAUTERY BLADE 6.4 (BLADE) ×2 IMPLANT
ELECT REM PT RETURN 9FT ADLT (ELECTROSURGICAL) ×2
ELECTRODE REM PT RTRN 9FT ADLT (ELECTROSURGICAL) ×2 IMPLANT
EVACUATOR SILICONE 100CC (DRAIN) IMPLANT
GAUZE SPONGE 4X4 12PLY STRL (GAUZE/BANDAGES/DRESSINGS) ×2 IMPLANT
GAUZE XEROFORM 5X9 LF (GAUZE/BANDAGES/DRESSINGS) ×2 IMPLANT
GLOVE BIO SURGEON STRL SZ8 (GLOVE) ×2 IMPLANT
GOWN STRL REUS W/ TWL LRG LVL3 (GOWN DISPOSABLE) ×4 IMPLANT
GOWN STRL REUS W/ TWL XL LVL3 (GOWN DISPOSABLE) ×2 IMPLANT
GOWN STRL REUS W/TWL LRG LVL3 (GOWN DISPOSABLE) ×4
GOWN STRL REUS W/TWL XL LVL3 (GOWN DISPOSABLE) ×2
KIT BASIN OR (CUSTOM PROCEDURE TRAY) ×2 IMPLANT
KIT PREVENA INCISION MGT20CM45 (CANNISTER) IMPLANT
KIT TURNOVER KIT B (KITS) ×2 IMPLANT
NS IRRIG 1000ML POUR BTL (IV SOLUTION) ×2 IMPLANT
PACK GENERAL/GYN (CUSTOM PROCEDURE TRAY) ×2 IMPLANT
PAD ARMBOARD 7.5X6 YLW CONV (MISCELLANEOUS) ×4 IMPLANT
PENCIL SMOKE EVACUATOR (MISCELLANEOUS) ×2 IMPLANT
PREVENA RESTOR ARTHOFORM 33X30 (CANNISTER) IMPLANT
PREVENA RESTOR ARTHOFORM 46X30 (CANNISTER) IMPLANT
STAPLER SKIN 35 REG (STAPLE) ×2 IMPLANT
STAPLER VISISTAT 35W (STAPLE) ×2 IMPLANT
STOCKINETTE IMPERVIOUS LG (DRAPES) ×2 IMPLANT
SUT ETHILON 2 0 PSLX (SUTURE) ×4 IMPLANT
SUT ETHILON 3 0 PS 1 (SUTURE) IMPLANT
SUT FIBERWIRE #5 38 CONV BLUE (SUTURE)
SUT SILK 0 TIES 10X30 (SUTURE) ×2 IMPLANT
SUT SILK 2 0 (SUTURE) ×2
SUT SILK 2 0 SH CR/8 (SUTURE) ×2 IMPLANT
SUT SILK 2-0 18XBRD TIE 12 (SUTURE) ×2 IMPLANT
SUT SILK 3 0 (SUTURE)
SUT SILK 3-0 18XBRD TIE 12 (SUTURE) IMPLANT
SUT VIC AB 2-0 CT1 18 (SUTURE) ×4 IMPLANT
SUTURE FIBERWR #5 38 CONV BLUE (SUTURE) IMPLANT
TAPE UMBILICAL COTTON 1/8X30 (MISCELLANEOUS) ×2 IMPLANT
TOWEL GREEN STERILE (TOWEL DISPOSABLE) ×4 IMPLANT
UNDERPAD 30X36 HEAVY ABSORB (UNDERPADS AND DIAPERS) ×2 IMPLANT
WATER STERILE IRR 1000ML POUR (IV SOLUTION) ×2 IMPLANT

## 2022-12-26 NOTE — Progress Notes (Signed)
[name] NOTE    [name]  HBZ:[id number] DOB: 05/03/1931 DOA: 12/22/2022 PCP: System, Provider Not In    Brief Narrative:   [name] is a 89 y.o. female with past medical history significant for DM2, HTN, chronic diastolic congestive heart failure, complete heart block s/p PPM, pulmonary hypertension who presented to [facility] ED via EMS from wound care clinic with left great toe wound.  Patient was seen in the clinic by Dr. Celine Ahr with notable oozing frank pus from her left great toe and was referred to the ED for potential need of amputation and angiography.  Patient denies fever/chills, no nausea/vomiting/diarrhea, no abdominal pain, no chest pain, no palpitations, no urinary symptoms.  In the ED, temperature 98.0 F, HR 87, RR 13, BP 147/71, SpO2 94% on room air.  WBC 10.6, hemoglobin 13.5, platelets 342.  Sodium 131, potassium 2.8, chloride 88, CO2 31, glucose 128, BUN 50, creatinine 1.10.  AST 27, ALT 10, total bilirubin 0.5.  CRP 2.7, ESR 86.  Left foot x-ray with no evidence of osteomyelitis, marked soft tissue swelling of the foot, pes planus deformity and collapse of the talus possibly Charcot arthropathy.  Vascular surgery was consulted.  EDP repleted potassium and magnesium.  TRH consulted for admission for further evaluation and management of left great toe gangrene, hyponatremia, hypokalemia.  Assessment & Plan:   Left great toe gangrene 2/2 severe PAD Patient presenting to the ED as a referral from the wound care clinic for left great toe gangrene with associated purulence.  Patient is afebrile but with slight leukocytosis of 10.6 on admission.  Elevated CRP/ESR markers.  X-ray with no evidence of osteomyelitis but with marked soft tissue swelling of the foot.  Vascular surgery was consulted and patient underwent aortogram, left lower extremity angiogram with findings of occlusion of popliteal artery, anterior tibial artery, tibioperoneal trunk, peroneal artery, posterior  tibial artery, pedal circulation with no options for revascularization. -- Vascular surgery on, appreciate assistance -- Atorvastatin 80 mg p.o. daily -- Gabapentin 300 mg p.o. twice daily -- Norco 5-325 mg p.o. every 6 hours as needed moderate pain -- N.p.o. for planned AKA today  Hyponatremia: Resolved Sodium 131 on admission, likely hypovolemic hyponatremia in the setting of poor oral intake in the days preceding hospitalization. -- Na 131>>136>136>135 -- Continue to hold home furosemide/metolazone for now -- BMP in am  Hypokalemia: Resolved Potassium 2.8 on admission.  Repleted. -- K 2.8>>3.8>3.7>3.6 -- BMP in the a.m.  Dyslipidemia Lipid panel with total cholesterol 173, HDL 31, LDL 118, triglycerides 122. -- Started on atorvastatin 80 mg p.o. daily -- Repeat lipid panel 8-12 weeks  Type 2 diabetes mellitus Hemoglobin A1c 7.0, at goal.  Home regimen includes Januvia 50 mg p.o. daily. -- Continue Tradjenta as hospital substitution -- SSI for coverage -- CBGs qAC/HS  Essential hypertension Chronic diastolic congestive heart failure Home regimen includes carvedilol 6.25 mg p.o. twice daily, furosemide 80 mg p.o. daily, metolazone 2.5 mg on M/T/W/T. -- Carvedilol 6.25 mg p.o. twice daily -- Holding home furosemide and metolazone due to hyponatremia, volume depletion as above -- Continue monitor BP closely -- Outpatient follow-up with cardiology  Hx complete heart block s/p PPM -- Continue monitor on telemetry -- Outpatient follow-up with cardiology  Generalized weakness/debility/gait disturbance: Current resides at [facility] consulted for assistance when patient medically cleared for discharge  Morbid obesity Body mass index is 38.79 kg/m.  Discussed with patient needs for aggressive lifestyle changes/weight loss as this complicates  all facets of care.  Outpatient follow-up with PCP.     DVT prophylaxis: SCD's Start: 12/23/22 1238 enoxaparin  (LOVENOX) injection 40 mg Start: 12/22/22 2030    Code Status: Full Code Family Communication: No family present at bedside this morning, updated sister present yesterday afternoon  Disposition Plan:  Level of care: Med-Surg Status is: Inpatient Remains inpatient appropriate because: Pending AKA by vascular surgery today,  anticipate return back to [facility] after    Consultants:  Vascular surgery, Dr. Stanford Breed  Procedures:  Abdominal aortogram with lower extremity runoff; 2/2, Dr. Stanford Breed Left AKA, vascular surgery, Dr. Stanford Breed: pending 2/5  Antimicrobials:  none   Subjective: Patient seen examined bedside, resting comfortably.  Lying in bed.  Sister present.  Plan for left AKA with vascular surgery later this morning.  No specific questions or concerns at this time.  Denies headache, no dizziness, no chest pain, no shortness of breath, no abdominal pain, no focal weakness, no fatigue, no paresthesias.  No acute events overnight per nursing staff.  Objective: Vitals:   12/25/22 1142 12/25/22 1614 12/25/22 2000 12/26/22 0836  BP: 133/64 (!) 161/74 (!) 155/82 (!) 145/63  Pulse: 77   81  Resp: 17  18 19   Temp: 97.6 F (36.4 C) (!) 97.3 F (36.3 C) 98 F (36.7 C) 98 F (36.7 C)  TempSrc: Oral Oral Oral Oral  SpO2: 96% 96%  97%  Weight:      Height:        Intake/Output Summary (Last 24 hours) at 12/26/2022 0842 Last data filed at 12/25/2022 2100 Gross per 24 hour  Intake --  Output 450 ml  Net -450 ml   Filed Weights   12/22/22 1430 12/22/22 2333  Weight: 92.4 kg 90.1 kg    Examination:  Physical Exam: GEN: NAD, alert and oriented x 3, early in appearance, obese HEENT: NCAT, PERRL, EOMI, sclera clear, MMM PULM: CTAB w/o wheezes/crackles, normal respiratory effort, on room air CV: RRR w/o M/G/R GI: abd soft, NTND, NABS, no R/G/M MSK: Right foot edema, mild tenderness distal aspect left foot without warmth, noted black discoloration tip left great toe, moves  all extremities independently, NEURO: CN II-XII intact, no focal deficits, sensation to light touch intact PSYCH: normal mood/affect Integumentary: Left foot as above, otherwise no other concerning rashes/lesions/wounds noted on exposed skin surfaces     Data Reviewed: I have personally reviewed following labs and imaging studies  CBC: Recent Labs  Lab 12/22/22 1534 12/22/22 2351 12/23/22 0747 12/23/22 1321 12/24/22 0119 12/26/22 0056  WBC 10.6* 10.6* 9.5 9.3 9.7 9.8  NEUTROABS 8.2*  --   --   --   --   --   HGB 13.5 13.4 13.1 13.2 11.1* 10.9*  HCT 41.8 39.2 39.6 40.0 34.6* 32.9*  MCV 88.7 86.0 88.2 88.1 90.1 88.7  PLT 342 315 322 312 300 329   Basic Metabolic Panel: Recent Labs  Lab 12/22/22 2006 12/22/22 2351 12/23/22 0747 12/23/22 1321 12/24/22 0119 12/26/22 0056  NA 131* 132* 136  --  136 135  K 3.1* 2.9* 3.8  --  3.7 3.6  CL 90* 91* 97*  --  105 101  CO2 26 29 28   --  25 26  GLUCOSE 119* 212* 121*  --  111* 82  BUN 49* 51* 45*  --  33* 17  CREATININE 1.05* 1.10* 0.98 0.97 1.02* 0.92  CALCIUM 9.6 9.4 9.5  --  8.6* 8.9  MG  --   --  2.6*  --  2.3 1.9   GFR: Estimated Creatinine Clearance: 41.4 mL/min (by C-G formula based on SCr of 0.92 mg/dL). Liver Function Tests: Recent Labs  Lab 12/22/22 1534 12/22/22 2006  AST 27 26  ALT 10 9  ALKPHOS 87 76  BILITOT 0.5 0.6  PROT 8.2* 7.7  ALBUMIN 3.5 3.3*   No results for input(s): "LIPASE", "AMYLASE" in the last 168 hours. No results for input(s): "AMMONIA" in the last 168 hours. Coagulation Profile: No results for input(s): "INR", "PROTIME" in the last 168 hours. Cardiac Enzymes: No results for input(s): "CKTOTAL", "CKMB", "CKMBINDEX", "TROPONINI" in the last 168 hours. BNP (last 3 results) No results for input(s): "PROBNP" in the last 8760 hours. HbA1C: No results for input(s): "HGBA1C" in the last 72 hours.  CBG: Recent Labs  Lab 12/25/22 0549 12/25/22 1204 12/25/22 1600 12/25/22 2155  12/26/22 0635  GLUCAP 115* 114* 101* 162* 80   Lipid Profile: Recent Labs    12/24/22 0119  CHOL 173  HDL 31*  LDLCALC 118*  TRIG 122  CHOLHDL 5.6   Thyroid Function Tests: No results for input(s): "TSH", "T4TOTAL", "FREET4", "T3FREE", "THYROIDAB" in the last 72 hours. Anemia Panel: No results for input(s): "VITAMINB12", "FOLATE", "FERRITIN", "TIBC", "IRON", "RETICCTPCT" in the last 72 hours. Sepsis Labs: No results for input(s): "PROCALCITON", "LATICACIDVEN" in the last 168 hours.  No results found for this or any previous visit (from the past 240 hour(s)).       Radiology Studies: No results found.      Scheduled Meds:  atorvastatin  80 mg Oral Daily   carvedilol  6.25 mg Oral BID WC   chlorhexidine       cholecalciferol  2,000 Units Oral q AM   enoxaparin (LOVENOX) injection  40 mg Subcutaneous Q24H   gabapentin  300 mg Oral BID   insulin aspart  0-9 Units Subcutaneous TID WC   linagliptin  5 mg Oral Daily   melatonin  3 mg Oral QHS   polyethylene glycol  17 g Oral Daily   sodium chloride flush  3 mL Intravenous Q12H   Continuous Infusions:  sodium chloride       LOS: 4 days    Time spent: 49 minutes spent on chart review, discussion with nursing staff, consultants, updating family and interview/physical exam; more than 50% of that time was spent in counseling and/or coordination of care.    Khalila Buechner J [place] DO [facility] Available via Epic secure chat 7am-7pm After these hours, please refer to coverage provider listed on [email or web address] 12/26/2022, 8:42 AM

## 2022-12-26 NOTE — Care Management Important Message (Signed)
Important Message  Patient Details  Name: [name] MRN: [id number] Date of Birth: 09-08-1931   Medicare Important Message Given:  Yes     Shelda Altes 12/26/2022, 10:33 AM

## 2022-12-26 NOTE — Op Note (Signed)
DATE OF SERVICE: 12/26/2022  PATIENT:  [name]  89 y.o. female  PRE-OPERATIVE DIAGNOSIS:  ischemic left leg  POST-OPERATIVE DIAGNOSIS:  Same  PROCEDURE:   Left above knee amputation  SURGEON:  Surgeon(s) and Role:    * Cherre Robins, MD - Primary  ASSISTANT: Paulo Fruit, PA-C  An experienced assistant was required given the complexity of this procedure and the standard of surgical care. My assistant helped with exposure through counter tension, suctioning, ligation and retraction to better visualize the surgical field.  My assistant expedited sewing during the case by following my sutures. Wherever I use the term "we" in the report, my assistant actively helped me with that portion of the procedure.  ANESTHESIA:   general  EBL: 267mL  BLOOD ADMINISTERED:none  DRAINS: none   LOCAL MEDICATIONS USED:  NONE  SPECIMEN:  none  COUNTS: confirmed correct.  TOURNIQUET:  none  PATIENT DISPOSITION:  PACU - hemodynamically stable.   Delay start of Pharmacological VTE agent (>24hrs) due to surgical blood loss or risk of bleeding: no  INDICATION FOR PROCEDURE: [name] is a 89 y.o. female with ischemic left leg without options for revascularization. After careful discussion of risks, benefits, and alternatives the patient was offered left above knee amputation. The patient understood and wished to proceed.  OPERATIVE FINDINGS: unremarkable left above knee amputation  DESCRIPTION OF PROCEDURE: After identification of the patient in the pre-operative holding area, the patient was transferred to the operating room. The patient was positioned supine on the operating room table. Anesthesia was induced. The left leg was prepped and draped in standard fashion. A surgical pause was performed confirming correct patient, procedure, and operative location.  A generous left thigh fishmouth incision was planned on the left lower extremity with a skin marker. A sterile tourniquet  was applied to the proximal thigh. An esmarch tourniquet was used to exsanguinate the leg. The pneumatic tourniquet was inflated. An incision was made as planned. The incision was carried down with a #21 scalpel through the subcutaneous tissue, and through the posterior and anterior compartments of the thigh until the femur was encountered. The periosteum about the femur was elevated as high as possible. The femur was transected with a powered saw. The vascular bundle was found in its typical position, and was suture-ligated proximally using a 2-0 silk suture.  The sciatic nerve was identified in typical position, retracted, transected at the retracted margin, and ligated with a 2-0 silk suture.  The tourniquet was released. Hemostasis was achieved in the surgical bed. The wound was copiously irrigated. A myodesis was performed with 2-0 Vicryl. The wound was closed in layers using 2-0 Vicryl, 2-0 nylon, surgical stapler.  A clean bandage was applied.  Upon completion of the case instrument and sharps counts were confirmed correct. The patient was transferred to the PACU in good condition. I was present for all portions of the procedure.  [name]. Stanford Breed, MD [facility] Phone Number: [phone] 12/26/2022 11:30 AM

## 2022-12-26 NOTE — Anesthesia Procedure Notes (Signed)
Procedure Name: Intubation Date/Time: 12/26/2022 10:25 AM  Performed by: Bryson Corona, CRNAPre-anesthesia Checklist: Patient identified, Emergency Drugs available, Suction available and Patient being monitored Patient Re-evaluated:Patient Re-evaluated prior to induction Oxygen Delivery Method: Circle System Utilized Preoxygenation: Pre-oxygenation with 100% oxygen Induction Type: IV induction Ventilation: Mask ventilation without difficulty Laryngoscope Size: Mac and 3 Grade View: Grade I Tube type: Oral Tube size: 7.0 mm Number of attempts: 1 Airway Equipment and Method: Stylet Placement Confirmation: ETT inserted through vocal cords under direct vision, positive ETCO2 and breath sounds checked- equal and bilateral Secured at: 21 cm Tube secured with: Tape Dental Injury: Teeth and Oropharynx as per pre-operative assessment

## 2022-12-26 NOTE — Evaluation (Signed)
Occupational Therapy Evaluation Patient Details Name: CHARMEL PRONOVOST MRN: [id number] DOB: 1931/05/13 Today's Date: 12/26/2022   History of Present Illness Patient is a 89 y/o female who presents on 2/1 from wound clinic with acute onset of left great toe necrosis and gangrene s/p left AKA 12/25/22. PMH includes AV block s/p pacemaker, CHF, pulmonary HTN and DM.   Clinical Impression   Rhen was evaluated s/p the above admission list, she is from [facility] where the staff dependently lifts her OOB and cares for all of her ADLs at bed level. Upon evaluation pt was limited by expected surgical pain, baseline weakness, verbosity and decreased activity tolerance. Overall she was total A +2 for rolling L&R. She is also max-total A +2 for3 all ADLs at bed level. Despite the recent surgery, pt is at her functional baseline. Recommend nsg lift pt OOB when appropriate, pt does not have acute OT needs. Recommend d/c back to SNF for total care.      Recommendations for follow up therapy are one component of a multi-disciplinary discharge planning process, led by the attending physician.  Recommendations may be updated based on patient status, additional functional criteria and insurance authorization.   Follow Up Recommendations  Skilled nursing-short term rehab (<3 hours/day)     Assistance Recommended at Discharge Frequent or constant Supervision/Assistance  Patient can return home with the following A lot of help with walking and/or transfers;Two people to help with walking and/or transfers;Two people to help with bathing/dressing/bathroom;A lot of help with bathing/dressing/bathroom;Assistance with cooking/housework;Assistance with feeding;Direct supervision/assist for medications management;Direct supervision/assist for financial management;Assist for transportation;Help with stairs or ramp for entrance    Functional Status Assessment  Patient has had a recent decline in their functional status and  demonstrates the ability to make significant improvements in function in a reasonable and predictable amount of time.  Equipment Recommendations  None recommended by OT    Recommendations for Other Services       Precautions / Restrictions Precautions Precautions: Other (comment) Precaution Comments: left AKA, wound vac Restrictions Weight Bearing Restrictions: No      Mobility Bed Mobility Overal bed mobility: Needs Assistance Bed Mobility: Rolling Rolling: Total assist, +2 for physical assistance         General bed mobility comments: total A of 2 to roll in both directions and only able to roll partially with assist to place UE on rail to assist.    Transfers                   General transfer comment: Deferred, uses lift to get OOB at SNF      Balance                                           ADL either performed or assessed with clinical judgement   ADL Overall ADL's : At baseline;Needs assistance/impaired                                       General ADL Comments: max-total A for all ADLs, this is pt's baseline at SNF.     Vision Baseline Vision/History: 0 No visual deficits Vision Assessment?: No apparent visual deficits     Perception Perception Perception Tested?: No   Praxis Praxis Praxis tested?: Not tested  Pertinent Vitals/Pain Pain Assessment Pain Assessment: Faces Faces Pain Scale: Hurts whole lot Pain Location: left residual limb with movement Pain Descriptors / Indicators: Grimacing, Guarding, Sore, Operative site guarding Pain Intervention(s): Limited activity within patient's tolerance     Hand Dominance     Extremity/Trunk Assessment Upper Extremity Assessment Upper Extremity Assessment: Generalized weakness (limited overhead ROM)   Lower Extremity Assessment Lower Extremity Assessment: Defer to PT evaluation RLE Deficits / Details: limited ankle DF AROM, limited knee/hip  flexion LLE Deficits / Details: limited AROM in all directions, post op pain LLE Sensation: decreased light touch   Cervical / Trunk Assessment Cervical / Trunk Assessment: Other exceptions Cervical / Trunk Exceptions: increased body habitus   Communication Communication Communication: No difficulties   Cognition Arousal/Alertness: Awake/alert Behavior During Therapy: WFL for tasks assessed/performed Overall Cognitive Status: Impaired/Different from baseline                                 General Comments: Pt verbose, needs to be redirected to task.     General Comments  VSS, daughter present. Red area on R heel noted, foam dressing applied. RN notified    Exercises     Shoulder Instructions      Home Living Family/patient expects to be discharged to:: Skilled nursing facility                                        Prior Functioning/Environment Prior Level of Function : Needs assist       Physical Assist : Mobility (physical) Mobility (physical): Bed mobility;Transfers   Mobility Comments: Uses lift to transfer to w/c daily. Not able to self propel w/c ADLs Comments: total care for all ADls        OT Problem List: Decreased strength;Decreased range of motion;Decreased activity tolerance;Impaired balance (sitting and/or standing);Decreased safety awareness;Decreased knowledge of use of DME or AE;Decreased knowledge of precautions;Obesity      OT Treatment/Interventions:      OT Goals(Current goals can be found in the care plan section) Acute Rehab OT Goals Patient Stated Goal: less pain OT Goal Formulation: With patient Time For Goal Achievement: 12/26/22  OT Frequency:      Co-evaluation PT/OT/SLP Co-Evaluation/Treatment: Yes Reason for Co-Treatment: For patient/therapist safety;To address functional/ADL transfers   OT goals addressed during session: ADL's and self-care      AM-PAC OT "6 Clicks" Daily Activity     Outcome  Measure Help from another person eating meals?: A Lot Help from another person taking care of personal grooming?: A Lot Help from another person toileting, which includes using toliet, bedpan, or urinal?: Total Help from another person bathing (including washing, rinsing, drying)?: Total Help from another person to put on and taking off regular upper body clothing?: Total Help from another person to put on and taking off regular lower body clothing?: Total 6 Click Score: 8   End of Session Nurse Communication: Mobility status;Need for lift equipment  Activity Tolerance: Patient tolerated treatment well Patient left: in bed;with call bell/phone within reach;with bed alarm set;with family/visitor present  OT Visit Diagnosis: Unsteadiness on feet (R26.81);Other abnormalities of gait and mobility (R26.89);Muscle weakness (generalized) (M62.81);Pain                Time: 4098-1191 OT Time Calculation (min): 27 min Charges:  OT General Charges $OT Visit:  1 Visit OT Evaluation $OT Eval Moderate Complexity: 1 Mod  Shade Flood, OTR/L Acute Rehabilitation Services Office [phone] Secure Chat Communication Preferred   Elliot Cousin 12/26/2022, 3:52 PM

## 2022-12-26 NOTE — Transfer of Care (Signed)
Immediate Anesthesia Transfer of Care Note  Patient: [name]  Procedure(s) Performed: AMPUTATION ABOVE KNEE (Left: Knee) APPLICATION OF WOUND VAC (Left: Leg Upper)  Patient Location: PACU  Anesthesia Type:General  Level of Consciousness: awake and alert   Airway & Oxygen Therapy: Patient Spontanous Breathing and Patient connected to nasal cannula oxygen  Post-op Assessment: Report given to RN and Post -op Vital signs reviewed and stable  Post vital signs: Reviewed and stable  Last Vitals:  Vitals Value Taken Time  BP 122/67 12/26/22 1135  Temp    Pulse 60 12/26/22 1144  Resp 13 12/26/22 1144  SpO2 97 % 12/26/22 1144  Vitals shown include unvalidated device data.  Last Pain:  Vitals:   12/26/22 0920  TempSrc: Oral  PainSc: 5       Patients Stated Pain Goal: 0 (96/22/29 7989)  Complications: No notable events documented.

## 2022-12-26 NOTE — Progress Notes (Signed)
[facility] [place] PROGRESS NOTE  ASSESSMENT / PLAN: [name] is a 89 y.o. female with ischemic left leg. No options for revascularization. Plan left above knee amputation today in OR.   SUBJECTIVE: No complaints. Reviewed plan for OR today.   OBJECTIVE: BP (!) 163/71   Pulse 81   Temp 98.1 F (36.7 C) (Oral)   Resp 18   Ht 5' (1.524 m)   Wt 90.1 kg   SpO2 98%   BMI 38.79 kg/m   Intake/Output Summary (Last 24 hours) at 12/26/2022 2409 Last data filed at 12/25/2022 2100 Gross per 24 hour  Intake --  Output 450 ml  Net -450 ml    Well appearing woman in no distress Regular rate and rhythm Unlabored breathing Left foot gangrene unchanged     Latest Ref Rng & Units 12/26/2022   12:56 AM 12/24/2022    1:19 AM 12/23/2022    1:21 PM  CBC  WBC 4.0 - 10.5 K/uL 9.8  9.7  9.3   Hemoglobin 12.0 - 15.0 g/dL 10.9  11.1  13.2   Hematocrit 36.0 - 46.0 % 32.9  34.6  40.0   Platelets 150 - 400 K/uL 282  300  312         Latest Ref Rng & Units 12/26/2022   12:56 AM 12/24/2022    1:19 AM 12/23/2022    1:21 PM  CMP  Glucose 70 - 99 mg/dL 82  111    BUN 8 - 23 mg/dL 17  33    Creatinine 0.44 - 1.00 mg/dL 0.92  1.02  0.97   Sodium 135 - 145 mmol/L 135  136    Potassium 3.5 - 5.1 mmol/L 3.6  3.7    Chloride 98 - 111 mmol/L 101  105    CO2 22 - 32 mmol/L 26  25    Calcium 8.9 - 10.3 mg/dL 8.9  8.6      Estimated Creatinine Clearance: 41.4 mL/min (by C-G formula based on SCr of 0.92 mg/dL).  [name]. [name] Breed, MD [facility] Phone Number: [phone] 12/26/2022 9:27 AM

## 2022-12-26 NOTE — Evaluation (Signed)
Physical Therapy Evaluation Patient Details Name: SHALECE STAFFA MRN: [id number] DOB: 1931/02/22 Today's Date: 12/26/2022  History of Present Illness  Patient is a 89 y/o female who presents on 2/1 from wound clinic with acute onset of left great toe necrosis and gangrene s/p left AKA 12/25/22. PMH includes AV block s/p pacemaker, CHF, pulmonary HTN and DM.  Clinical Impression  Patient presents with pain and post surgical deficits s/p above surgery. Pt is a long term care resident at [facility] and is dependent for all care at baseline. The staff uses the lift to transfer pt to the w/c daily. Today, pt requires total A of 2 for rolling to provide pressure relief to back/bottom. Able to minimally move left residual limb. Some redness on right heel so donned foam pad. Would benefit from air mattress to prevent skin breakdown and pressure wounds as pt is not very mobile at baseline. Recommend return to long term care SNF facility. Pt does not require acute skilled therapy services as she is dependent at baseline and despite recent surgery, at her functional baseline. No PT needs. Recommend nursing lift pt OOB to chair daily. Discharge from therapy.       Recommendations for follow up therapy are one component of a multi-disciplinary discharge planning process, led by the attending physician.  Recommendations may be updated based on patient status, additional functional criteria and insurance authorization.  Follow Up Recommendations Skilled nursing-short term rehab (<3 hours/day) (return to LTC SNF) Can patient physically be transported by private vehicle: No    Assistance Recommended at Discharge Frequent or constant Supervision/Assistance  Patient can return home with the following  Two people to help with walking and/or transfers;Two people to help with bathing/dressing/bathroom    Equipment Recommendations None recommended by PT  Recommendations for Other Services       Functional  Status Assessment Patient has not had a recent decline in their functional status     Precautions / Restrictions Precautions Precautions: Other (comment) Precaution Comments: left AKA, wound vac Restrictions Weight Bearing Restrictions: No      Mobility  Bed Mobility Overal bed mobility: Needs Assistance Bed Mobility: Rolling Rolling: Total assist, +2 for physical assistance         General bed mobility comments: total A of 2 to roll in both directions and only able to roll partially with assist to place UE on rail to assist.    Transfers                   General transfer comment: Deferred, uses lift to get OOB at SNF    Ambulation/Gait                  Stairs            Wheelchair Mobility    Modified Rankin (Stroke Patients Only)       Balance                                             Pertinent Vitals/Pain Pain Assessment Pain Assessment: Faces Faces Pain Scale: Hurts whole lot Pain Location: left residual limb with movement Pain Descriptors / Indicators: Grimacing, Guarding, Sore, Operative site guarding Pain Intervention(s): Monitored during session, Premedicated before session, Limited activity within patient's tolerance, Repositioned    Home Living Family/patient expects to be discharged to:: Skilled nursing facility ([facility]  [place] resident)                        Prior Function Prior Level of Function : Needs assist       Physical Assist : Mobility (physical) Mobility (physical): Bed mobility;Transfers   Mobility Comments: Uses lift to transfer to w/c daily. Not able to self propel w/c ADLs Comments: total care for all ADls     Hand Dominance        Extremity/Trunk Assessment   Upper Extremity Assessment Upper Extremity Assessment: Defer to OT evaluation    Lower Extremity Assessment Lower Extremity Assessment: LLE deficits/detail;Generalized weakness;RLE deficits/detail RLE  Deficits / Details: limited ankle DF AROM, limited knee/hip flexion LLE Deficits / Details: limited AROM in all directions, post op pain LLE Sensation: decreased light touch       Communication   Communication: No difficulties  Cognition Arousal/Alertness: Awake/alert Behavior During Therapy: WFL for tasks assessed/performed Overall Cognitive Status: Impaired/Different from baseline                                 General Comments: Pt verbose, needs to be redirected to task.        General Comments      Exercises     Assessment/Plan    PT Assessment All further PT needs can be met in the next venue of care  PT Problem List Decreased strength;Decreased range of motion;Decreased mobility;Obesity;Pain;Impaired sensation;Decreased cognition       PT Treatment Interventions      PT Goals (Current goals can be found in the Care Plan section)  Acute Rehab PT Goals Patient Stated Goal: per daughter, to go back to long term care SNF PT Goal Formulation: All assessment and education complete, DC therapy    Frequency       Co-evaluation PT/OT/SLP Co-Evaluation/Treatment: Yes Reason for Co-Treatment: To address functional/ADL transfers;For patient/therapist safety           AM-PAC PT "6 Clicks" Mobility  Outcome Measure Help needed turning from your back to your side while in a flat bed without using bedrails?: Total Help needed moving from lying on your back to sitting on the side of a flat bed without using bedrails?: Total Help needed moving to and from a bed to a chair (including a wheelchair)?: Total Help needed standing up from a chair using your arms (e.g., wheelchair or bedside chair)?: Total Help needed to walk in hospital room?: Total Help needed climbing 3-5 steps with a railing? : Total 6 Click Score: 6    End of Session   Activity Tolerance: Patient limited by pain Patient left: in bed;with call bell/phone within reach;with bed alarm  set;with SCD's reapplied;with family/visitor present Nurse Communication: Mobility status;Other (comment);Need for lift equipment (air mattress) PT Visit Diagnosis: Pain Pain - Right/Left: Left Pain - part of body: Leg    Time: 6948-5462 PT Time Calculation (min) (ACUTE ONLY): 27 min   Charges:   PT Evaluation $PT Eval Moderate Complexity: 1 Mod          Marisa Severin, PT, DPT Acute Rehabilitation Services Secure chat preferred Office [place] 12/26/2022, 3:45 PM

## 2022-12-26 NOTE — Anesthesia Postprocedure Evaluation (Signed)
Anesthesia Post Note  Patient: [name]  Procedure(s) Performed: AMPUTATION ABOVE KNEE (Left: Knee) APPLICATION OF WOUND VAC (Left: Leg Upper)     Patient location during evaluation: PACU Anesthesia Type: General Level of consciousness: awake and alert Pain management: pain level controlled Vital Signs Assessment: post-procedure vital signs reviewed and stable Respiratory status: spontaneous breathing, nonlabored ventilation and respiratory function stable Cardiovascular status: blood pressure returned to baseline and stable Postop Assessment: no apparent nausea or vomiting Anesthetic complications: no  No notable events documented.  Last Vitals:  Vitals:   12/26/22 1229 12/26/22 1255  BP: (!) 121/56 (!) 148/66  Pulse: 70 79  Resp: 18 14  Temp: 36.6 C (!) 36.3 C  SpO2: 100% 100%    Last Pain:  Vitals:   12/26/22 1326  TempSrc:   PainSc: 8                  Rayette Mogg,W. EDMOND

## 2022-12-26 NOTE — Discharge Instructions (Signed)
[facility]  Discharge instructions  Lower Extremity Amputation  Please refer to the following instruction for your post-procedure care. Your surgeon or physician assistant will discuss any changes with you.  Activity  You are encouraged to walk as much as you can. You can slowly return to normal activities during the month after your surgery. Avoid strenuous activity and heavy lifting until your doctor tells you it's OK. Avoid activities such as vacuuming or swinging a golf club. Do not drive until your doctor give the OK and you are no longer taking prescription pain medications. It is also normal to have difficulty with sleep habits, eating and bowel movement after surgery. These will go away with time.  Bathing/Showering  Shower daily after you go home. Do not soak in a bathtub, hot tub, or swim until the incision heals completely.  Incision Care  Clean your incision with mild soap and water. Shower every day. Pat the area dry with a clean towel. You do not need a bandage unless otherwise instructed. Do not apply any ointments or creams to your incision. If you have open wounds you will be instructed how to care for them or a visiting nurse may be arranged for you. If you have staples or sutures along your incision they will be removed at your post-op appointment. You may have skin glue on your incision. Do not peel it off. It will come off on its own in about one week.  Diet  Resume your normal diet. There are no special food restrictions following this procedure. A low fat/ low cholesterol diet is recommended for all patients with vascular disease. In order to heal from your surgery, it is CRITICAL to get adequate nutrition. Your body requires vitamins, minerals, and protein. Vegetables are the best source of vitamins and minerals. Vegetables also provide the perfect balance of protein. Processed food has little nutritional value, so try to avoid  this.  Medications  Resume taking all your medications unless your doctor or physician assistant tells you not to. If your incision is causing pain, you may take over-the-counter pain relievers such as acetaminophen (Tylenol). If you were prescribed a stronger pain medication, please aware these medication can cause nausea and constipation. Prevent nausea by taking the medication with a snack or meal. Avoid constipation by drinking plenty of fluids and eating foods with high amount of fiber, such as fruits, vegetables, and grains. Take Colace 100 mg (an over-the-counter stool softener) twice a day as needed for constipation.  Do not take Tylenol if you are taking prescription pain medications.  Follow Up  Our office will schedule a follow up appointment 4 weeks following discharge.  Please call us immediately for any of the following conditions  Increase pain, redness, warmth, or drainage (pus) from your incision site(s) Fever of 101 degree or higher The swelling in your leg with the amputation suddenly worsens and becomes more painful than when you were in the hospital  Leg swelling is common after amputation surgery.  The swelling should improve over a few months following surgery. To improve the swelling, you may elevate your legs above the level of your heart while you are sitting or resting. Your surgeon or physician assistant may ask you to apply an ACE wrap or wear compression (TED) stockings to help to reduce swelling.  Reduce your risk of vascular disease  Stop smoking. If you would like help call [facility] at 1-800-QUIT-NOW 905-306-0778) or [place] at (209)106-9500.  Manage  your cholesterol Maintain a desired weight Control your diabetes weight Control your diabetes Keep your blood pressure down  If you have any questions, please call the office at [phone]

## 2022-12-26 NOTE — Anesthesia Preprocedure Evaluation (Addendum)
Anesthesia Evaluation  Patient identified by MRN, date of birth, ID band Patient awake    Reviewed: Allergy & Precautions, H&P , NPO status , Patient's Chart, lab work & pertinent test results  Airway Mallampati: II  TM Distance: >3 FB Neck ROM: Full    Dental no notable dental hx. (+) Edentulous Upper, Partial Lower, Dental Advisory Given   Pulmonary neg pulmonary ROS   Pulmonary exam normal breath sounds clear to auscultation       Cardiovascular hypertension, Pt. on medications +CHF  + dysrhythmias + pacemaker  Rhythm:Regular Rate:Normal     Neuro/Psych   Anxiety     negative neurological ROS     GI/Hepatic negative GI ROS, Neg liver ROS,,,  Endo/Other  diabetes, Type 2, Oral Hypoglycemic Agents  Morbid obesity  Renal/GU negative Renal ROS  negative genitourinary   Musculoskeletal  (+) Arthritis , Osteoarthritis,    Abdominal   Peds  Hematology negative hematology ROS (+)   Anesthesia Other Findings   Reproductive/Obstetrics negative OB ROS                             Anesthesia Physical Anesthesia Plan  ASA: 3  Anesthesia Plan: General   Post-op Pain Management: Ofirmev IV (intra-op)*   Induction: Intravenous  PONV Risk Score and Plan: 3 and Ondansetron, Dexamethasone and Treatment may vary due to age or medical condition  Airway Management Planned: Oral ETT  Additional Equipment:   Intra-op Plan:   Post-operative Plan: Extubation in OR  Informed Consent: I have reviewed the patients History and Physical, chart, labs and discussed the procedure including the risks, benefits and alternatives for the proposed anesthesia with the patient or authorized representative who has indicated his/her understanding and acceptance.     Dental advisory given  Plan Discussed with: CRNA  Anesthesia Plan Comments: (Discussed GA v. SAB for the procedure. Pt and family wish for a GA.)        Anesthesia Quick Evaluation

## 2022-12-27 ENCOUNTER — Encounter (HOSPITAL_COMMUNITY): Payer: Self-pay | Admitting: Vascular Surgery

## 2022-12-27 DIAGNOSIS — E876 Hypokalemia: Secondary | ICD-10-CM | POA: Diagnosis not present

## 2022-12-27 LAB — BASIC METABOLIC PANEL
Anion gap: 4 — ABNORMAL LOW (ref 5–15)
BUN: 16 mg/dL (ref 8–23)
CO2: 25 mmol/L (ref 22–32)
Calcium: 8.4 mg/dL — ABNORMAL LOW (ref 8.9–10.3)
Chloride: 104 mmol/L (ref 98–111)
Creatinine, Ser: 0.79 mg/dL (ref 0.44–1.00)
GFR, Estimated: >60 mL/min (ref >60)
Glucose, Bld: 128 mg/dL — ABNORMAL HIGH (ref 70–99)
Potassium: 4 mmol/L (ref 3.5–5.1)
Sodium: 133 mmol/L — ABNORMAL LOW (ref 135–145)

## 2022-12-27 LAB — MAGNESIUM: Magnesium: 1.9 mg/dL (ref 1.7–2.4)

## 2022-12-27 LAB — CBC
HCT: 29.2 % — ABNORMAL LOW (ref 36.0–46.0)
Hemoglobin: 9.9 g/dL — ABNORMAL LOW (ref 12.0–15.0)
MCH: 30.3 pg (ref 26.0–34.0)
MCHC: 33.9 g/dL (ref 30.0–36.0)
MCV: 89.3 fL (ref 80.0–100.0)
Platelets: 252 10*3/uL (ref 150–400)
RBC: 3.27 MIL/uL — ABNORMAL LOW (ref 3.87–5.11)
RDW: 13.8 % (ref 11.5–15.5)
WBC: 10.1 10*3/uL (ref 4.0–10.5)
nRBC: 0 % (ref 0.0–0.2)

## 2022-12-27 LAB — GLUCOSE, CAPILLARY
Glucose-Capillary: 129 mg/dL — ABNORMAL HIGH (ref 70–99)
Glucose-Capillary: 115 mg/dL — ABNORMAL HIGH (ref 70–99)
Glucose-Capillary: 132 mg/dL — ABNORMAL HIGH (ref 70–99)
Glucose-Capillary: 125 mg/dL — ABNORMAL HIGH (ref 70–99)

## 2022-12-27 NOTE — Social Work (Signed)
Called patient's son,[name], he requested CSW call patient's daughter, [name]. CSW called [name], left voice message to return call.   Thurmond Butts, MSW, LCSW Clinical Social Worker

## 2022-12-27 NOTE — Progress Notes (Signed)
PROGRESS NOTE    [name]  ACZ:[id number] DOB: [birth year] DOA: 12/22/2022 PCP: System, Provider Not In    Brief Narrative:   [name] is a 89 y.o. female with past medical history significant for DM2, HTN, chronic diastolic congestive heart failure, complete heart block s/p PPM, pulmonary hypertension who presented to [facility] ED via EMS from wound care clinic with left great toe wound.  Patient was seen in the clinic by Dr. Celine Ahr with notable oozing frank pus from her left great toe and was referred to the ED for potential need of amputation and angiography.  Patient denies fever/chills, no nausea/vomiting/diarrhea, no abdominal pain, no chest pain, no palpitations, no urinary symptoms.  In the ED, temperature 98.0 F, HR 87, RR 13, BP 147/71, SpO2 94% on room air.  WBC 10.6, hemoglobin 13.5, platelets 342.  Sodium 131, potassium 2.8, chloride 88, CO2 31, glucose 128, BUN 50, creatinine 1.10.  AST 27, ALT 10, total bilirubin 0.5.  CRP 2.7, ESR 86.  Left foot x-ray with no evidence of osteomyelitis, marked soft tissue swelling of the foot, pes planus deformity and collapse of the talus possibly Charcot arthropathy.  Vascular surgery was consulted.  EDP repleted potassium and magnesium.  TRH consulted for admission for further evaluation and management of left great toe gangrene, hyponatremia, hypokalemia.  Assessment & Plan:   Left great toe gangrene 2/2 severe PAD Left lower extremity critical limb ischemia s/p AKA Patient presenting to the ED as a referral from the wound care clinic for left great toe gangrene with associated purulence.  Patient is afebrile but with slight leukocytosis of 10.6 on admission.  Elevated CRP/ESR markers.  X-ray with no evidence of osteomyelitis but with marked soft tissue swelling of the foot.  Vascular surgery was consulted and patient underwent aortogram, left lower extremity angiogram with findings of occlusion of popliteal artery, anterior tibial artery,  tibioperoneal trunk, peroneal artery, posterior tibial artery, pedal circulation with no options for revascularization.  Patient underwent AKA by vascular surgery, Dr. Luan Pulling on 12/26/2022. -- Vascular surgery following, appreciate assistance -- Atorvastatin 80 mg p.o. daily -- Gabapentin 300 mg p.o. twice daily -- Norco 5-325 mg p.o. every 6 hours as needed moderate pain -- Continue PT/OT, anticipate discharge back to SNF once cleared by vascular surgery  Hyponatremia: Resolved Sodium 131 on admission, likely hypovolemic hyponatremia in the setting of poor oral intake in the days preceding hospitalization. -- Na 131>>136>136>135>133 -- Continue to hold home furosemide/metolazone for now --Continue to encourage increased oral intake -- BMP in am  Hypokalemia: Resolved Potassium 2.8 on admission.  Repleted. -- K 2.8>>3.8>3.7>3.6>4.0 -- BMP in the a.m.  Dyslipidemia Lipid panel with total cholesterol 173, HDL 31, LDL 118, triglycerides 122. -- Started on atorvastatin 80 mg p.o. daily -- Repeat lipid panel 8-12 weeks  Type 2 diabetes mellitus Hemoglobin A1c 7.0, at goal.  Home regimen includes Januvia 50 mg p.o. daily. -- Continue Tradjenta as hospital substitution -- SSI for coverage -- CBGs qAC/HS  Essential hypertension Chronic diastolic congestive heart failure Home regimen includes carvedilol 6.25 mg p.o. twice daily, furosemide 80 mg p.o. daily, metolazone 2.5 mg on M/T/W/T. -- Carvedilol 6.25 mg p.o. twice daily -- Holding home furosemide and metolazone due to hyponatremia, volume depletion as above -- Continue monitor BP closely -- Outpatient follow-up with cardiology  Hx complete heart block s/p PPM -- Continue monitor on telemetry -- Outpatient follow-up with cardiology  Generalized weakness/debility/gait disturbance: Current resides at [facility]. --  TOC consulted for assistance for return when patient medically cleared for discharge  Morbid obesity Body  mass index is 38.79 kg/m.  Discussed with patient needs for aggressive lifestyle changes/weight loss as this complicates all facets of care.  Outpatient follow-up with PCP.     DVT prophylaxis: SCD's Start: 12/26/22 1256 SCD's Start: 12/26/22 1256 enoxaparin (LOVENOX) injection 40 mg Start: 12/22/22 2030    Code Status: Full Code Family Communication: Updated family present at bedside this morning.  Disposition Plan:  Level of care: Med-Surg Status is: Inpatient Remains inpatient appropriate because: anticipate return back to [facility] after cleared by vascular surgery    Consultants:  Vascular surgery, Dr. Stanford Breed  Procedures:  Abdominal aortogram with lower extremity runoff; 2/2, Dr. Stanford Breed Left AKA, vascular surgery, Dr. Stanford Breed, 2/5  Antimicrobials:  Perioperative cefazolin   Subjective: Patient seen examined bedside, resting comfortably.  Lying in bed.  Family present.  Complaining of some pain to left AKA site.  Seen by vascular surgery this morning.  Discussed plan for discharge back to SNF once cleared by vascular surgery.  Remains with wound VAC in place. No specific questions or concerns at this time.  Denies headache, no dizziness, no chest pain, no shortness of breath, no abdominal pain, no focal weakness, no fatigue, no paresthesias.  No acute events overnight per nursing staff.  Objective: Vitals:   12/26/22 2300 12/27/22 0300 12/27/22 0600 12/27/22 0813  BP: 121/64 128/62 (!) 123/46 (!) 157/63  Pulse: 79 72 78 (!) 56  Resp: 17 (!) 34 17 20  Temp: 98 F (36.7 C)   97.8 F (36.6 C)  TempSrc: Oral   Oral  SpO2: 98% 95% 99% 98%  Weight:      Height:        Intake/Output Summary (Last 24 hours) at 12/27/2022 1011 Last data filed at 12/27/2022 0343 Gross per 24 hour  Intake 2018.51 ml  Output 200 ml  Net 1818.51 ml   Filed Weights   12/22/22 1430 12/22/22 2333  Weight: 92.4 kg 90.1 kg    Examination:  Physical Exam: GEN: NAD, alert and oriented x  3, early in appearance, obese HEENT: NCAT, PERRL, EOMI, sclera clear, MMM PULM: CTAB w/o wheezes/crackles, normal respiratory effort, on room air CV: RRR w/o M/G/R GI: abd soft, NTND, NABS, no R/G/M MSK: Right foot edema, left AKA noted with wound VAC in place NEURO: CN II-XII intact, no focal deficits, sensation to light touch intact PSYCH: normal mood/affect Integumentary: Left AKA noted with wound VAC in place, otherwise no other concerning rashes/lesions/wounds noted on exposed skin surfaces     Data Reviewed: I have personally reviewed following labs and imaging studies  CBC: Recent Labs  Lab 12/22/22 1534 12/22/22 2351 12/23/22 0747 12/23/22 1321 12/24/22 0119 12/26/22 0056 12/27/22 0119  WBC 10.6*   < > 9.5 9.3 9.7 9.8 10.1  NEUTROABS 8.2*  --   --   --   --   --   --   HGB 13.5   < > 13.1 13.2 11.1* 10.9* 9.9*  HCT 41.8   < > 39.6 40.0 34.6* 32.9* 29.2*  MCV 88.7   < > 88.2 88.1 90.1 88.7 89.3  PLT 342   < > 322 312 300 282 252   < > = values in this interval not displayed.   Basic Metabolic Panel: Recent Labs  Lab 12/22/22 2351 12/23/22 0747 12/23/22 1321 12/24/22 0119 12/26/22 0056 12/27/22 0119  NA 132* 136  --  136 135 133*  K 2.9* 3.8  --  3.7 3.6 4.0  CL 91* 97*  --  105 101 104  CO2 29 28  --  25 26 25   GLUCOSE 212* 121*  --  111* 82 128*  BUN 51* 45*  --  33* 17 16  CREATININE 1.10* 0.98 0.97 1.02* 0.92 0.79  CALCIUM 9.4 9.5  --  8.6* 8.9 8.4*  MG  --  2.6*  --  2.3 1.9 1.9   GFR: Estimated Creatinine Clearance: 47.6 mL/min (by C-G formula based on SCr of 0.79 mg/dL). Liver Function Tests: Recent Labs  Lab 12/22/22 1534 12/22/22 2006  AST 27 26  ALT 10 9  ALKPHOS 87 76  BILITOT 0.5 0.6  PROT 8.2* 7.7  ALBUMIN 3.5 3.3*   No results for input(s): "LIPASE", "AMYLASE" in the last 168 hours. No results for input(s): "AMMONIA" in the last 168 hours. Coagulation Profile: No results for input(s): "INR", "PROTIME" in the last 168  hours. Cardiac Enzymes: No results for input(s): "CKTOTAL", "CKMB", "CKMBINDEX", "TROPONINI" in the last 168 hours. BNP (last 3 results) No results for input(s): "PROBNP" in the last 8760 hours. HbA1C: No results for input(s): "HGBA1C" in the last 72 hours.  CBG: Recent Labs  Lab 12/26/22 0909 12/26/22 1137 12/26/22 1545 12/26/22 2143 12/27/22 0551  GLUCAP 92 90 142* 177* 129*   Lipid Profile: No results for input(s): "CHOL", "HDL", "LDLCALC", "TRIG", "CHOLHDL", "LDLDIRECT" in the last 72 hours.  Thyroid Function Tests: No results for input(s): "TSH", "T4TOTAL", "FREET4", "T3FREE", "THYROIDAB" in the last 72 hours. Anemia Panel: No results for input(s): "VITAMINB12", "FOLATE", "FERRITIN", "TIBC", "IRON", "RETICCTPCT" in the last 72 hours. Sepsis Labs: No results for input(s): "PROCALCITON", "LATICACIDVEN" in the last 168 hours.  No results found for this or any previous visit (from the past 240 hour(s)).       Radiology Studies: No results found.      Scheduled Meds:  atorvastatin  80 mg Oral Daily   carvedilol  6.25 mg Oral BID WC   cholecalciferol  2,000 Units Oral q AM   docusate sodium  100 mg Oral Daily   enoxaparin (LOVENOX) injection  40 mg Subcutaneous Q24H   gabapentin  300 mg Oral BID   insulin aspart  0-9 Units Subcutaneous TID WC   linagliptin  5 mg Oral Daily   melatonin  3 mg Oral QHS   pantoprazole  40 mg Oral Daily   polyethylene glycol  17 g Oral Daily   sodium chloride flush  3 mL Intravenous Q12H   Continuous Infusions:  sodium chloride     sodium chloride 75 mL/hr at 12/27/22 0343     LOS: 5 days    Time spent: 49 minutes spent on chart review, discussion with nursing staff, consultants, updating family and interview/physical exam; more than 50% of that time was spent in counseling and/or coordination of care.    Robynne Roat J [place] DO [facility] Available via Epic secure chat 7am-7pm After these hours, please refer to  coverage provider listed on [email or web address] 12/27/2022, 10:11 AM

## 2022-12-27 NOTE — Progress Notes (Addendum)
  Progress Note    12/27/2022 6:38 AM 1 Day Post-Op  Subjective:  no complaints  Afebrile  Vitals:   12/26/22 2300 12/27/22 0300  BP: 121/64 128/62  Pulse: 79 72  Resp: 17 (!) 34  Temp: 98 F (36.7 C)   SpO2: 98% 95%    Physical Exam: Incisions:  ioban & vac dressing in place and is clean and dry   CBC    Component Value Date/Time   WBC 10.1 12/27/2022 0119   RBC 3.27 (L) 12/27/2022 0119   HGB 9.9 (L) 12/27/2022 0119   HGB 12.2 12/10/2021 1241   HCT 29.2 (L) 12/27/2022 0119   HCT 36.9 12/10/2021 1241   PLT 252 12/27/2022 0119   PLT 290 12/10/2021 1241   MCV 89.3 12/27/2022 0119   MCV 89 12/10/2021 1241   MCH 30.3 12/27/2022 0119   MCHC 33.9 12/27/2022 0119   RDW 13.8 12/27/2022 0119   RDW 13.0 12/10/2021 1241   LYMPHSABS 1.2 12/22/2022 1534   LYMPHSABS WILL FOLLOW 11/24/2016 1643   LYMPHSABS 1.5 11/24/2016 1643   MONOABS 0.8 12/22/2022 1534   EOSABS 0.3 12/22/2022 1534   EOSABS WILL FOLLOW 11/24/2016 1643   EOSABS 0.3 11/24/2016 1643   BASOSABS 0.0 12/22/2022 1534   BASOSABS WILL FOLLOW 11/24/2016 1643   BASOSABS 0.0 11/24/2016 1643    BMET    Component Value Date/Time   NA 133 (L) 12/27/2022 0119   NA 140 12/10/2021 1241   K 4.0 12/27/2022 0119   CL 104 12/27/2022 0119   CO2 25 12/27/2022 0119   GLUCOSE 128 (H) 12/27/2022 0119   BUN 16 12/27/2022 0119   BUN 34 (H) 12/10/2021 1241   CREATININE 0.79 12/27/2022 0119   CALCIUM 8.4 (L) 12/27/2022 0119   GFRNONAA >60 12/27/2022 0119   GFRAA 59 (L) 12/14/2020 1408    INR    Component Value Date/Time   INR 1.1 (H) 12/01/2011 1607     Intake/Output Summary (Last 24 hours) at 12/27/2022 8921 Last data filed at 12/27/2022 0343 Gross per 24 hour  Intake 2018.51 ml  Output 200 ml  Net 1818.51 ml     Assessment/Plan:  89 y.o. female is s/p left above knee amputation   1 Day Post-Op   -pt doing well this morning.  Her left AKA dressing is clean. -anticipate dressing off at the end of the week.      Leontine Locket, PA-C [facility] [phone] 12/27/2022 6:38 AM   VASCULAR STAFF ADDENDUM: I have independently interviewed and examined the patient. I agree with the above.  PT/OT/OOB Anticipate DC to her nursing home.  [name]. Stanford Breed, MD [facility] Phone Number: [phone] 12/27/2022 9:45 AM

## 2022-12-28 DIAGNOSIS — E876 Hypokalemia: Secondary | ICD-10-CM | POA: Diagnosis not present

## 2022-12-28 LAB — BASIC METABOLIC PANEL
Anion gap: 6 (ref 5–15)
BUN: 22 mg/dL (ref 8–23)
CO2: 23 mmol/L (ref 22–32)
Calcium: 8.4 mg/dL — ABNORMAL LOW (ref 8.9–10.3)
Chloride: 104 mmol/L (ref 98–111)
Creatinine, Ser: 1 mg/dL (ref 0.44–1.00)
GFR, Estimated: 54 mL/min — ABNORMAL LOW (ref >60)
Glucose, Bld: 116 mg/dL — ABNORMAL HIGH (ref 70–99)
Potassium: 3.9 mmol/L (ref 3.5–5.1)
Sodium: 133 mmol/L — ABNORMAL LOW (ref 135–145)

## 2022-12-28 LAB — GLUCOSE, CAPILLARY
Glucose-Capillary: 84 mg/dL (ref 70–99)
Glucose-Capillary: 88 mg/dL (ref 70–99)
Glucose-Capillary: 137 mg/dL — ABNORMAL HIGH (ref 70–99)
Glucose-Capillary: 131 mg/dL — ABNORMAL HIGH (ref 70–99)

## 2022-12-28 NOTE — Progress Notes (Signed)
PROGRESS NOTE    [name]  ZOX:[id number] DOB: 1930/12/13 DOA: 12/22/2022 PCP: System, Provider Not In    Brief Narrative:   [name] is a 89 y.o. female with past medical history significant for DM2, HTN, chronic diastolic congestive heart failure, complete heart block s/p PPM, pulmonary hypertension who presented to [facility] ED via EMS from wound care clinic with left great toe wound.  Patient was seen in the clinic by Dr. Celine Ahr with notable oozing frank pus from her left great toe and was referred to the ED for potential need of amputation and angiography.  Patient denies fever/chills, no nausea/vomiting/diarrhea, no abdominal pain, no chest pain, no palpitations, no urinary symptoms.  In the ED, temperature 98.0 F, HR 87, RR 13, BP 147/71, SpO2 94% on room air.  WBC 10.6, hemoglobin 13.5, platelets 342.  Sodium 131, potassium 2.8, chloride 88, CO2 31, glucose 128, BUN 50, creatinine 1.10.  AST 27, ALT 10, total bilirubin 0.5.  CRP 2.7, ESR 86.  Left foot x-ray with no evidence of osteomyelitis, marked soft tissue swelling of the foot, pes planus deformity and collapse of the talus possibly Charcot arthropathy.  Vascular surgery was consulted.  EDP repleted potassium and magnesium.  TRH consulted for admission for further evaluation and management of left great toe gangrene, hyponatremia, hypokalemia.  Assessment & Plan:   Left great toe gangrene 2/2 severe PAD Left lower extremity critical limb ischemia s/p AKA Patient presenting to the ED as a referral from the wound care clinic for left great toe gangrene with associated purulence.  Patient is afebrile but with slight leukocytosis of 10.6 on admission.  Elevated CRP/ESR markers.  X-ray with no evidence of osteomyelitis but with marked soft tissue swelling of the foot.  Vascular surgery was consulted and patient underwent aortogram, left lower extremity angiogram with findings of occlusion of popliteal artery, anterior tibial artery,  tibioperoneal trunk, peroneal artery, posterior tibial artery, pedal circulation with no options for revascularization.  Patient underwent AKA by vascular surgery, Dr. Luan Pulling on 12/26/2022. -- Vascular surgery following, appreciate assistance -- Atorvastatin 80 mg p.o. daily -- Gabapentin 300 mg p.o. twice daily -- Norco 5-325 mg p.o. every 6 hours as needed moderate pain -- Continue PT/OT, anticipate discharge back to SNF once cleared by vascular surgery  Hyponatremia: Resolved Sodium 131 on admission, likely hypovolemic hyponatremia in the setting of poor oral intake in the days preceding hospitalization. -- Na 131>>136>136>135>133 -- Continue to hold home furosemide/metolazone for now --Continue to encourage increased oral intake -- BMP in am  Hypokalemia: Resolved Potassium 2.8 on admission.  Repleted. -- K 2.8>>3.8>3.7>3.6>4.0>2.9 -- BMP in the a.m.  Dyslipidemia Lipid panel with total cholesterol 173, HDL 31, LDL 118, triglycerides 122. -- Started on atorvastatin 80 mg p.o. daily -- Repeat lipid panel 8-12 weeks  Type 2 diabetes mellitus Hemoglobin A1c 7.0, at goal.  Home regimen includes Januvia 50 mg p.o. daily. -- Continue Tradjenta as hospital substitution -- SSI for coverage -- CBGs qAC/HS  Essential hypertension Chronic diastolic congestive heart failure Home regimen includes carvedilol 6.25 mg p.o. twice daily, furosemide 80 mg p.o. daily, metolazone 2.5 mg on M/T/W/T. -- Carvedilol 6.25 mg p.o. twice daily -- Holding home furosemide and metolazone due to hyponatremia, volume depletion as above -- Continue monitor BP closely -- Outpatient follow-up with cardiology  Hx complete heart block s/p PPM -- Continue monitor on telemetry -- Outpatient follow-up with cardiology  Generalized weakness/debility/gait disturbance: Current resides at [facility] --  TOC consulted for assistance for return when patient medically cleared for discharge  Morbid  obesity Body mass index is 38.79 kg/m.  Discussed with patient needs for aggressive lifestyle changes/weight loss as this complicates all facets of care.  Outpatient follow-up with PCP.     DVT prophylaxis: SCD's Start: 12/26/22 1256 SCD's Start: 12/26/22 1256 enoxaparin (LOVENOX) injection 40 mg Start: 12/22/22 2030    Code Status: Full Code Family Communication: Updated family present at bedside this morning.  Disposition Plan:  Level of care: Med-Surg Status is: Inpatient Remains inpatient appropriate because: anticipate return back to [facility] after cleared by vascular surgery    Consultants:  Vascular surgery, Dr. Stanford Breed  Procedures:  Abdominal aortogram with lower extremity runoff; 2/2, Dr. Stanford Breed Left AKA, vascular surgery, Dr. Stanford Breed, 2/5  Antimicrobials:  Perioperative cefazolin   Subjective: Patient seen examined bedside, resting comfortably.  Lying in bed.  Family present.  Reports pain control.  Seen by vascular surgery this morning with plan to remove dressing at end of week.  Discussed plan for discharge back to SNF once cleared by vascular surgery.  Remains with wound VAC in place. No specific questions or concerns at this time.  Denies headache, no dizziness, no chest pain, no shortness of breath, no abdominal pain, no focal weakness, no fatigue, no paresthesias.  No acute events overnight per nursing staff.  Objective: Vitals:   12/28/22 0600 12/28/22 0759 12/28/22 0800 12/28/22 0900  BP: (!) 116/58 (!) 113/52 127/63 (!) 128/56  Pulse: (!) 112 (!) 51    Resp: 16 14 13 14   Temp:  97.6 F (36.4 C)    TempSrc:  Oral    SpO2: 100% 100%  97%  Weight:      Height:        Intake/Output Summary (Last 24 hours) at 12/28/2022 1049 Last data filed at 12/28/2022 0800 Gross per 24 hour  Intake 1171.49 ml  Output 500 ml  Net 671.49 ml   Filed Weights   12/22/22 1430 12/22/22 2333  Weight: 92.4 kg 90.1 kg    Examination:  Physical Exam: GEN: NAD,  alert and oriented x 3, early in appearance, obese HEENT: NCAT, PERRL, EOMI, sclera clear, MMM PULM: CTAB w/o wheezes/crackles, normal respiratory effort, on room air CV: RRR w/o M/G/R GI: abd soft, NTND, NABS, no R/G/M MSK: Right foot edema, left AKA noted with wound VAC in place NEURO: CN II-XII intact, no focal deficits, sensation to light touch intact PSYCH: normal mood/affect Integumentary: Left AKA noted with wound VAC in place, otherwise no other concerning rashes/lesions/wounds noted on exposed skin surfaces     Data Reviewed: I have personally reviewed following labs and imaging studies  CBC: Recent Labs  Lab 12/22/22 1534 12/22/22 2351 12/23/22 0747 12/23/22 1321 12/24/22 0119 12/26/22 0056 12/27/22 0119  WBC 10.6*   < > 9.5 9.3 9.7 9.8 10.1  NEUTROABS 8.2*  --   --   --   --   --   --   HGB 13.5   < > 13.1 13.2 11.1* 10.9* 9.9*  HCT 41.8   < > 39.6 40.0 34.6* 32.9* 29.2*  MCV 88.7   < > 88.2 88.1 90.1 88.7 89.3  PLT 342   < > 322 312 300 282 252   < > = values in this interval not displayed.   Basic Metabolic Panel: Recent Labs  Lab 12/23/22 0747 12/23/22 1321 12/24/22 0119 12/26/22 0056 12/27/22 0119 12/28/22 0135  NA 136  --  136 135 133* 133*  K 3.8  --  3.7 3.6 4.0 3.9  CL 97*  --  105 101 104 104  CO2 28  --  25 26 25 23   GLUCOSE 121*  --  111* 82 128* 116*  BUN 45*  --  33* 17 16 22   CREATININE 0.98 0.97 1.02* 0.92 0.79 1.00  CALCIUM 9.5  --  8.6* 8.9 8.4* 8.4*  MG 2.6*  --  2.3 1.9 1.9  --    GFR: Estimated Creatinine Clearance: 38.1 mL/min (by C-G formula based on SCr of 1 mg/dL). Liver Function Tests: Recent Labs  Lab 12/22/22 1534 12/22/22 2006  AST 27 26  ALT 10 9  ALKPHOS 87 76  BILITOT 0.5 0.6  PROT 8.2* 7.7  ALBUMIN 3.5 3.3*   No results for input(s): "LIPASE", "AMYLASE" in the last 168 hours. No results for input(s): "AMMONIA" in the last 168 hours. Coagulation Profile: No results for input(s): "INR", "PROTIME" in the last  168 hours. Cardiac Enzymes: No results for input(s): "CKTOTAL", "CKMB", "CKMBINDEX", "TROPONINI" in the last 168 hours. BNP (last 3 results) No results for input(s): "PROBNP" in the last 8760 hours. HbA1C: No results for input(s): "HGBA1C" in the last 72 hours.  CBG: Recent Labs  Lab 12/27/22 0551 12/27/22 1137 12/27/22 1621 12/27/22 2110 12/28/22 0559  GLUCAP 129* 115* 132* 125* 84   Lipid Profile: No results for input(s): "CHOL", "HDL", "LDLCALC", "TRIG", "CHOLHDL", "LDLDIRECT" in the last 72 hours.  Thyroid Function Tests: No results for input(s): "TSH", "T4TOTAL", "FREET4", "T3FREE", "THYROIDAB" in the last 72 hours. Anemia Panel: No results for input(s): "VITAMINB12", "FOLATE", "FERRITIN", "TIBC", "IRON", "RETICCTPCT" in the last 72 hours. Sepsis Labs: No results for input(s): "PROCALCITON", "LATICACIDVEN" in the last 168 hours.  No results found for this or any previous visit (from the past 240 hour(s)).       Radiology Studies: No results found.      Scheduled Meds:  atorvastatin  80 mg Oral Daily   carvedilol  6.25 mg Oral BID WC   cholecalciferol  2,000 Units Oral q AM   docusate sodium  100 mg Oral Daily   enoxaparin (LOVENOX) injection  40 mg Subcutaneous Q24H   gabapentin  300 mg Oral BID   insulin aspart  0-9 Units Subcutaneous TID WC   linagliptin  5 mg Oral Daily   melatonin  3 mg Oral QHS   pantoprazole  40 mg Oral Daily   polyethylene glycol  17 g Oral Daily   sodium chloride flush  3 mL Intravenous Q12H   Continuous Infusions:  sodium chloride     sodium chloride Stopped (12/28/22 0935)     LOS: 6 days    Time spent: 48 minutes spent on chart review, discussion with nursing staff, consultants, updating family and interview/physical exam; more than 50% of that time was spent in counseling and/or coordination of care.    Chania Kochanski J [place] DO [facility] Available via Epic secure chat 7am-7pm After these hours, please refer to  coverage provider listed on [email or web address] 12/28/2022, 10:49 AM

## 2022-12-28 NOTE — Progress Notes (Signed)
  Progress Note    12/28/2022 6:43 AM 2 Days Post-Op  Subjective:  pt sleeping  Afebrile  Vitals:   12/28/22 0400 12/28/22 0600  BP: (!) 105/51 (!) 116/58  Pulse: 75 (!) 112  Resp: 15 16  Temp:    SpO2: 100% 100%    Physical Exam: General:  sleeping soundly; I did not wake her.    CBC    Component Value Date/Time   WBC 10.1 12/27/2022 0119   RBC 3.27 (L) 12/27/2022 0119   HGB 9.9 (L) 12/27/2022 0119   HGB 12.2 12/10/2021 1241   HCT 29.2 (L) 12/27/2022 0119   HCT 36.9 12/10/2021 1241   PLT 252 12/27/2022 0119   PLT 290 12/10/2021 1241   MCV 89.3 12/27/2022 0119   MCV 89 12/10/2021 1241   MCH 30.3 12/27/2022 0119   MCHC 33.9 12/27/2022 0119   RDW 13.8 12/27/2022 0119   RDW 13.0 12/10/2021 1241   LYMPHSABS 1.2 12/22/2022 1534   LYMPHSABS WILL FOLLOW 11/24/2016 1643   LYMPHSABS 1.5 11/24/2016 1643   MONOABS 0.8 12/22/2022 1534   EOSABS 0.3 12/22/2022 1534   EOSABS WILL FOLLOW 11/24/2016 1643   EOSABS 0.3 11/24/2016 1643   BASOSABS 0.0 12/22/2022 1534   BASOSABS WILL FOLLOW 11/24/2016 1643   BASOSABS 0.0 11/24/2016 1643    BMET    Component Value Date/Time   NA 133 (L) 12/28/2022 0135   NA 140 12/10/2021 1241   K 3.9 12/28/2022 0135   CL 104 12/28/2022 0135   CO2 23 12/28/2022 0135   GLUCOSE 116 (H) 12/28/2022 0135   BUN 22 12/28/2022 0135   BUN 34 (H) 12/10/2021 1241   CREATININE 1.00 12/28/2022 0135   CALCIUM 8.4 (L) 12/28/2022 0135   GFRNONAA 54 (L) 12/28/2022 0135   GFRAA 59 (L) 12/14/2020 1408    INR    Component Value Date/Time   INR 1.1 (H) 12/01/2011 1607     Intake/Output Summary (Last 24 hours) at 12/28/2022 4132 Last data filed at 12/28/2022 0600 Gross per 24 hour  Intake 1171.49 ml  Output --  Net 1171.49 ml     Assessment/Plan:  89 y.o. female is s/p left above knee amputation   2 Days Post-Op   -plan for dressing to be removed at the end of the week. -anticipate discharge back to her SNF.  PT/OT recommending  SNF   Leontine Locket, PA-C [facility] [phone] 12/28/2022 6:43 AM

## 2022-12-28 NOTE — Plan of Care (Signed)
  Problem: Skin Integrity: Goal: Risk for impaired skin integrity will decrease Outcome: Progressing   

## 2022-12-29 DIAGNOSIS — E876 Hypokalemia: Secondary | ICD-10-CM | POA: Diagnosis not present

## 2022-12-29 LAB — GLUCOSE, CAPILLARY
Glucose-Capillary: 113 mg/dL — ABNORMAL HIGH (ref 70–99)
Glucose-Capillary: 105 mg/dL — ABNORMAL HIGH (ref 70–99)
Glucose-Capillary: 108 mg/dL — ABNORMAL HIGH (ref 70–99)
Glucose-Capillary: 131 mg/dL — ABNORMAL HIGH (ref 70–99)

## 2022-12-29 LAB — SURGICAL PATHOLOGY

## 2022-12-29 NOTE — Progress Notes (Signed)
PROGRESS NOTE    [name]  XTK:[id number] DOB: May 10, 1931 DOA: 12/22/2022 PCP: System, Provider Not In    Brief Narrative:   [name] is a 89 y.o. female with past medical history significant for DM2, HTN, chronic diastolic congestive heart failure, complete heart block s/p PPM, pulmonary hypertension who presented to [facility] ED via EMS from wound care clinic with left great toe wound.  Patient was seen in the clinic by Dr. Celine Ahr with notable oozing frank pus from her left great toe and was referred to the ED for potential need of amputation and angiography.  Patient denies fever/chills, no nausea/vomiting/diarrhea, no abdominal pain, no chest pain, no palpitations, no urinary symptoms.  In the ED, temperature 98.0 F, HR 87, RR 13, BP 147/71, SpO2 94% on room air.  WBC 10.6, hemoglobin 13.5, platelets 342.  Sodium 131, potassium 2.8, chloride 88, CO2 31, glucose 128, BUN 50, creatinine 1.10.  AST 27, ALT 10, total bilirubin 0.5.  CRP 2.7, ESR 86.  Left foot x-ray with no evidence of osteomyelitis, marked soft tissue swelling of the foot, pes planus deformity and collapse of the talus possibly Charcot arthropathy.  Vascular surgery was consulted.  EDP repleted potassium and magnesium.  TRH consulted for admission for further evaluation and management of left great toe gangrene, hyponatremia, hypokalemia.  Assessment & Plan:   Left great toe gangrene 2/2 severe PAD Left lower extremity critical limb ischemia s/p AKA Patient presenting to the ED as a referral from the wound care clinic for left great toe gangrene with associated purulence.  Patient is afebrile but with slight leukocytosis of 10.6 on admission.  Elevated CRP/ESR markers.  X-ray with no evidence of osteomyelitis but with marked soft tissue swelling of the foot.  Vascular surgery was consulted and patient underwent aortogram, left lower extremity angiogram with findings of occlusion of popliteal artery, anterior tibial artery,  tibioperoneal trunk, peroneal artery, posterior tibial artery, pedal circulation with no options for revascularization.  Patient underwent AKA by vascular surgery, Dr. Luan Pulling on 12/26/2022. -- Vascular surgery following, appreciate assistance -- Atorvastatin 80 mg p.o. daily -- Gabapentin 300 mg p.o. twice daily -- Norco 5-325 mg p.o. every 6 hours as needed moderate pain -- Continue PT/OT, anticipate discharge back to SNF once cleared by vascular surgery; vascular surgery plans to remove dressing tomorrow  Hyponatremia: Resolved Sodium 131 on admission, likely hypovolemic hyponatremia in the setting of poor oral intake in the days preceding hospitalization. -- Na 131>>136>136>135>133 -- Continue to hold home furosemide/metolazone for now --Continue to encourage increased oral intake -- BMP in am  Hypokalemia: Resolved Potassium 2.8 on admission.  Repleted. -- K 2.8>>3.8>3.7>3.6>4.0>2.9 -- BMP in the a.m.  Dyslipidemia Lipid panel with total cholesterol 173, HDL 31, LDL 118, triglycerides 122. -- Started on atorvastatin 80 mg p.o. daily -- Repeat lipid panel 8-12 weeks  Type 2 diabetes mellitus Hemoglobin A1c 7.0, at goal.  Home regimen includes Januvia 50 mg p.o. daily. -- Continue Tradjenta as hospital substitution -- SSI for coverage -- CBGs qAC/HS  Essential hypertension Chronic diastolic congestive heart failure Home regimen includes carvedilol 6.25 mg p.o. twice daily, furosemide 80 mg p.o. daily, metolazone 2.5 mg on M/T/W/T. -- Carvedilol 6.25 mg p.o. twice daily -- Holding home furosemide and metolazone due to hyponatremia, volume depletion as above -- Continue monitor BP closely -- Outpatient follow-up with cardiology  Hx complete heart block s/p PPM -- Continue monitor on telemetry -- Outpatient follow-up with cardiology  Generalized weakness/debility/gait disturbance:  Current resides at [facility] consulted for assistance for return when patient  medically cleared for discharge  Morbid obesity Body mass index is 38.79 kg/m.  Discussed with patient needs for aggressive lifestyle changes/weight loss as this complicates all facets of care.  Outpatient follow-up with PCP.     DVT prophylaxis: SCD's Start: 12/26/22 1256 SCD's Start: 12/26/22 1256 enoxaparin (LOVENOX) injection 40 mg Start: 12/22/22 2030    Code Status: Full Code Family Communication: No family present at bedside this morning, updated via telephone yesterday evening  Disposition Plan:  Level of care: Med-Surg Status is: Inpatient Remains inpatient appropriate because: anticipate return back to [facility] after cleared by vascular surgery; possibly on 2/9    Consultants:  Vascular surgery, Dr. Stanford Breed  Procedures:  Abdominal aortogram with lower extremity runoff; 2/2, Dr. Stanford Breed Left AKA, vascular surgery, Dr. Stanford Breed, 2/5  Antimicrobials:  Perioperative cefazolin   Subjective: Patient seen examined bedside, resting comfortably.  Lying in bed.  No family present this morning.  Reports some mild pain to her left stump region.  Wound VAC remains in place.  Seen by vascular surgery this morning with plan to remove dressing tomorrow. No specific questions or concerns at this time.  Denies headache, no dizziness, no chest pain, no shortness of breath, no abdominal pain, no focal weakness, no fatigue, no paresthesias.  No acute events overnight per nursing staff.  Objective: Vitals:   12/28/22 2042 12/28/22 2351 12/29/22 0321 12/29/22 0742  BP: (!) 151/55 (!) 135/56 (!) 136/53 (!) 141/60  Pulse: 60 60  (!) 59  Resp: 16 19 19 20   Temp: 98.5 F (36.9 C) 98.7 F (37.1 C) 98.7 F (37.1 C) 97.7 F (36.5 C)  TempSrc: Axillary Axillary Oral Oral  SpO2: 94% 96% 94% 97%  Weight:      Height:        Intake/Output Summary (Last 24 hours) at 12/29/2022 1204 Last data filed at 12/28/2022 2336 Gross per 24 hour  Intake 0 ml  Output 0 ml  Net 0 ml   Filed  Weights   12/22/22 1430 12/22/22 2333  Weight: 92.4 kg 90.1 kg    Examination:  Physical Exam: GEN: NAD, alert and oriented x 3, early in appearance, obese HEENT: NCAT, PERRL, EOMI, sclera clear, MMM PULM: CTAB w/o wheezes/crackles, normal respiratory effort, on room air CV: RRR w/o M/G/R GI: abd soft, NTND, NABS, no R/G/M MSK: Right foot edema, left AKA noted with wound VAC in place NEURO: CN II-XII intact, no focal deficits, sensation to light touch intact PSYCH: normal mood/affect Integumentary: Left AKA noted with wound VAC in place, otherwise no other concerning rashes/lesions/wounds noted on exposed skin surfaces     Data Reviewed: I have personally reviewed following labs and imaging studies  CBC: Recent Labs  Lab 12/22/22 1534 12/22/22 2351 12/23/22 0747 12/23/22 1321 12/24/22 0119 12/26/22 0056 12/27/22 0119  WBC 10.6*   < > 9.5 9.3 9.7 9.8 10.1  NEUTROABS 8.2*  --   --   --   --   --   --   HGB 13.5   < > 13.1 13.2 11.1* 10.9* 9.9*  HCT 41.8   < > 39.6 40.0 34.6* 32.9* 29.2*  MCV 88.7   < > 88.2 88.1 90.1 88.7 89.3  PLT 342   < > 322 312 300 282 252   < > = values in this interval not displayed.   Basic Metabolic Panel: Recent Labs  Lab 12/23/22  4920 12/23/22 1321 12/24/22 0119 12/26/22 0056 12/27/22 0119 12/28/22 0135  NA 136  --  136 135 133* 133*  K 3.8  --  3.7 3.6 4.0 3.9  CL 97*  --  105 101 104 104  CO2 28  --  25 26 25 23   GLUCOSE 121*  --  111* 82 128* 116*  BUN 45*  --  33* 17 16 22   CREATININE 0.98 0.97 1.02* 0.92 0.79 1.00  CALCIUM 9.5  --  8.6* 8.9 8.4* 8.4*  MG 2.6*  --  2.3 1.9 1.9  --    GFR: Estimated Creatinine Clearance: 38.1 mL/min (by C-G formula based on SCr of 1 mg/dL). Liver Function Tests: Recent Labs  Lab 12/22/22 1534 12/22/22 2006  AST 27 26  ALT 10 9  ALKPHOS 87 76  BILITOT 0.5 0.6  PROT 8.2* 7.7  ALBUMIN 3.5 3.3*   No results for input(s): "LIPASE", "AMYLASE" in the last 168 hours. No results for  input(s): "AMMONIA" in the last 168 hours. Coagulation Profile: No results for input(s): "INR", "PROTIME" in the last 168 hours. Cardiac Enzymes: No results for input(s): "CKTOTAL", "CKMB", "CKMBINDEX", "TROPONINI" in the last 168 hours. BNP (last 3 results) No results for input(s): "PROBNP" in the last 8760 hours. HbA1C: No results for input(s): "HGBA1C" in the last 72 hours.  CBG: Recent Labs  Lab 12/28/22 0559 12/28/22 1126 12/28/22 1543 12/28/22 2042 12/29/22 0616  GLUCAP 84 88 137* 131* 113*   Lipid Profile: No results for input(s): "CHOL", "HDL", "LDLCALC", "TRIG", "CHOLHDL", "LDLDIRECT" in the last 72 hours.  Thyroid Function Tests: No results for input(s): "TSH", "T4TOTAL", "FREET4", "T3FREE", "THYROIDAB" in the last 72 hours. Anemia Panel: No results for input(s): "VITAMINB12", "FOLATE", "FERRITIN", "TIBC", "IRON", "RETICCTPCT" in the last 72 hours. Sepsis Labs: No results for input(s): "PROCALCITON", "LATICACIDVEN" in the last 168 hours.  No results found for this or any previous visit (from the past 240 hour(s)).       Radiology Studies: No results found.      Scheduled Meds:  atorvastatin  80 mg Oral Daily   carvedilol  6.25 mg Oral BID WC   cholecalciferol  2,000 Units Oral q AM   docusate sodium  100 mg Oral Daily   enoxaparin (LOVENOX) injection  40 mg Subcutaneous Q24H   gabapentin  300 mg Oral BID   insulin aspart  0-9 Units Subcutaneous TID WC   linagliptin  5 mg Oral Daily   melatonin  3 mg Oral QHS   pantoprazole  40 mg Oral Daily   polyethylene glycol  17 g Oral Daily   sodium chloride flush  3 mL Intravenous Q12H   Continuous Infusions:  sodium chloride     sodium chloride Stopped (12/28/22 0935)     LOS: 7 days    Time spent: 48 minutes spent on chart review, discussion with nursing staff, consultants, updating family and interview/physical exam; more than 50% of that time was spent in counseling and/or coordination of  care.    Kimisha Eunice J [place] DO [facility] Available via Epic secure chat 7am-7pm After these hours, please refer to coverage provider listed on [email or web address] 12/29/2022, 12:04 PM

## 2022-12-29 NOTE — Progress Notes (Signed)
  Progress Note    12/29/2022 6:41 AM 3 Days Post-Op  Subjective:  sleeping, wakes easily.  Says she is having some pain.   Afebrile  Vitals:   12/28/22 2351 12/29/22 0321  BP: (!) 135/56 (!) 136/53  Pulse: 60   Resp: 19 19  Temp: 98.7 F (37.1 C) 98.7 F (37.1 C)  SpO2: 96% 94%    Physical Exam: Incisions:  bandage in place and is clean and dry   CBC    Component Value Date/Time   WBC 10.1 12/27/2022 0119   RBC 3.27 (L) 12/27/2022 0119   HGB 9.9 (L) 12/27/2022 0119   HGB 12.2 12/10/2021 1241   HCT 29.2 (L) 12/27/2022 0119   HCT 36.9 12/10/2021 1241   PLT 252 12/27/2022 0119   PLT 290 12/10/2021 1241   MCV 89.3 12/27/2022 0119   MCV 89 12/10/2021 1241   MCH 30.3 12/27/2022 0119   MCHC 33.9 12/27/2022 0119   RDW 13.8 12/27/2022 0119   RDW 13.0 12/10/2021 1241   LYMPHSABS 1.2 12/22/2022 1534   LYMPHSABS WILL FOLLOW 11/24/2016 1643   LYMPHSABS 1.5 11/24/2016 1643   MONOABS 0.8 12/22/2022 1534   EOSABS 0.3 12/22/2022 1534   EOSABS WILL FOLLOW 11/24/2016 1643   EOSABS 0.3 11/24/2016 1643   BASOSABS 0.0 12/22/2022 1534   BASOSABS WILL FOLLOW 11/24/2016 1643   BASOSABS 0.0 11/24/2016 1643    BMET    Component Value Date/Time   NA 133 (L) 12/28/2022 0135   NA 140 12/10/2021 1241   K 3.9 12/28/2022 0135   CL 104 12/28/2022 0135   CO2 23 12/28/2022 0135   GLUCOSE 116 (H) 12/28/2022 0135   BUN 22 12/28/2022 0135   BUN 34 (H) 12/10/2021 1241   CREATININE 1.00 12/28/2022 0135   CALCIUM 8.4 (L) 12/28/2022 0135   GFRNONAA 54 (L) 12/28/2022 0135   GFRAA 59 (L) 12/14/2020 1408    INR    Component Value Date/Time   INR 1.1 (H) 12/01/2011 1607     Intake/Output Summary (Last 24 hours) at 12/29/2022 0641 Last data filed at 12/28/2022 2336 Gross per 24 hour  Intake 0 ml  Output 500 ml  Net -500 ml     Assessment/Plan:  89 y.o. female is s/p left above knee amputation   3 Days Post-Op   -will remove dressing tomorrow.  -PT/OT recommending  SNF-anticipate dc back there   Leontine Locket, PA-C [facility] [phone] 12/29/2022 6:41 AM

## 2022-12-30 DIAGNOSIS — E876 Hypokalemia: Secondary | ICD-10-CM | POA: Diagnosis not present

## 2022-12-30 LAB — CBC
HCT: 28.6 % — ABNORMAL LOW (ref 36.0–46.0)
Hemoglobin: 8.9 g/dL — ABNORMAL LOW (ref 12.0–15.0)
MCH: 28.6 pg (ref 26.0–34.0)
MCHC: 31.1 g/dL (ref 30.0–36.0)
MCV: 92 fL (ref 80.0–100.0)
Platelets: 280 10*3/uL (ref 150–400)
RBC: 3.11 MIL/uL — ABNORMAL LOW (ref 3.87–5.11)
RDW: 14.5 % (ref 11.5–15.5)
WBC: 9.2 10*3/uL (ref 4.0–10.5)
nRBC: 0 % (ref 0.0–0.2)

## 2022-12-30 LAB — GLUCOSE, CAPILLARY
Glucose-Capillary: 95 mg/dL (ref 70–99)
Glucose-Capillary: 107 mg/dL — ABNORMAL HIGH (ref 70–99)
Glucose-Capillary: 150 mg/dL — ABNORMAL HIGH (ref 70–99)
Glucose-Capillary: 132 mg/dL — ABNORMAL HIGH (ref 70–99)

## 2022-12-30 LAB — BASIC METABOLIC PANEL
Anion gap: 7 (ref 5–15)
BUN: 25 mg/dL — ABNORMAL HIGH (ref 8–23)
CO2: 25 mmol/L (ref 22–32)
Calcium: 8.8 mg/dL — ABNORMAL LOW (ref 8.9–10.3)
Chloride: 102 mmol/L (ref 98–111)
Creatinine, Ser: 0.88 mg/dL (ref 0.44–1.00)
GFR, Estimated: >60 mL/min (ref >60)
Glucose, Bld: 107 mg/dL — ABNORMAL HIGH (ref 70–99)
Potassium: 4.6 mmol/L (ref 3.5–5.1)
Sodium: 134 mmol/L — ABNORMAL LOW (ref 135–145)

## 2022-12-30 MED ORDER — CYCLOBENZAPRINE HCL 10 MG PO TABS
5.0000 mg | ORAL_TABLET | Freq: Three times a day (TID) | ORAL | Status: DC
  Administered 2022-12-30 – 2022-12-31 (×4): 5 mg via ORAL
  Filled 2022-12-30 (×4): qty 1

## 2022-12-30 MED ORDER — OXYCODONE HCL 5 MG PO TABS
10.0000 mg | ORAL_TABLET | ORAL | Status: DC | PRN
  Administered 2022-12-30 – 2022-12-31 (×4): 10 mg via ORAL
  Filled 2022-12-30 (×5): qty 2

## 2022-12-30 MED ORDER — GABAPENTIN 400 MG PO CAPS
400.0000 mg | ORAL_CAPSULE | Freq: Two times a day (BID) | ORAL | Status: DC
  Administered 2022-12-30 – 2022-12-31 (×2): 400 mg via ORAL
  Filled 2022-12-30 (×2): qty 1

## 2022-12-30 NOTE — Plan of Care (Signed)
  Problem: Health Behavior/Discharge Planning: Goal: Ability to manage health-related needs will improve Outcome: Not Progressing   Problem: Nutritional: Goal: Maintenance of adequate nutrition will improve Outcome: Not Progressing Goal: Progress toward achieving an optimal weight will improve Outcome: Not Progressing

## 2022-12-30 NOTE — Plan of Care (Signed)
  Problem: Skin Integrity: Goal: Risk for impaired skin integrity will decrease Outcome: Progressing   Problem: Health Behavior/Discharge Planning: Goal: Ability to manage health-related needs will improve Outcome: Not Progressing   Problem: Activity: Goal: Ability to return to baseline activity level will improve Outcome: Not Progressing

## 2022-12-30 NOTE — Progress Notes (Signed)
PT Note  Patient Details Name: [name] MRN: [id number] DOB: 04/27/1931   Cancelled Treatment:    Reason Eval/Treat Not Completed: PT screened, no needs identified, will sign off. Per PT eval on 12/26/22 pt is dependent for all mobility and is a long term resident of SNF and staff there uses a lift to to get her OOB.  No acute care PT needs noted at this time therefore will sign off again.     Melvern Banker 12/30/2022, 3:01 PM Lavonia Dana, [place]  Office [phone] 12/30/2022

## 2022-12-30 NOTE — TOC Progression Note (Signed)
Transition of Care [facility]) - Progression Note    Patient Details  Name: [name] MRN: [id number] Date of Birth: 07/22/1931  Transition of Care [facility]) CM/SW [place],  Phone Number: 12/30/2022, 5:55 PM  Clinical Narrative:     CSW received call from patient's daughter- she confirmed patient is form [facility] & anticipate her returning once stable.   CSW contacted [facility]- provided update and informed patient has wound vac. Thurmond Butts, MSW, LCSW Clinical Social Worker    Expected Discharge Plan: (P) [place]    Expected Discharge Plan and Services                                               Social Determinants of Health (SDOH) Interventions SDOH Screenings   Tobacco Use: Low Risk  (12/27/2022)    Readmission Risk Interventions     No data to display

## 2022-12-30 NOTE — Progress Notes (Signed)
  Progress Note    12/30/2022 6:39 AM 4 Days Post-Op  Subjective:  daughter states she had a rough night with pain.    afebrile Vitals:   12/29/22 2354 12/30/22 0613  BP: (!) 139/46 (!) 141/57  Pulse: 71 79  Resp: 15 20  Temp:  98.1 F (36.7 C)  SpO2: 97% 96%    Physical Exam: Incisions:  dressing removed and healing nicely.    CBC    Component Value Date/Time   WBC 9.2 12/30/2022 0109   RBC 3.11 (L) 12/30/2022 0109   HGB 8.9 (L) 12/30/2022 0109   HGB 12.2 12/10/2021 1241   HCT 28.6 (L) 12/30/2022 0109   HCT 36.9 12/10/2021 1241   PLT 280 12/30/2022 0109   PLT 290 12/10/2021 1241   MCV 92.0 12/30/2022 0109   MCV 89 12/10/2021 1241   MCH 28.6 12/30/2022 0109   MCHC 31.1 12/30/2022 0109   RDW 14.5 12/30/2022 0109   RDW 13.0 12/10/2021 1241   LYMPHSABS 1.2 12/22/2022 1534   LYMPHSABS WILL FOLLOW 11/24/2016 1643   LYMPHSABS 1.5 11/24/2016 1643   MONOABS 0.8 12/22/2022 1534   EOSABS 0.3 12/22/2022 1534   EOSABS WILL FOLLOW 11/24/2016 1643   EOSABS 0.3 11/24/2016 1643   BASOSABS 0.0 12/22/2022 1534   BASOSABS WILL FOLLOW 11/24/2016 1643   BASOSABS 0.0 11/24/2016 1643    BMET    Component Value Date/Time   NA 134 (L) 12/30/2022 0109   NA 140 12/10/2021 1241   K 4.6 12/30/2022 0109   CL 102 12/30/2022 0109   CO2 25 12/30/2022 0109   GLUCOSE 107 (H) 12/30/2022 0109   BUN 25 (H) 12/30/2022 0109   BUN 34 (H) 12/10/2021 1241   CREATININE 0.[name] 12/30/2022 0109   CALCIUM 8.8 (L) 12/30/2022 0109   GFRNONAA >60 12/30/2022 0109   GFRAA 59 (L) 12/14/2020 1408    INR    Component Value Date/Time   INR 1.1 (H) 12/01/2011 1607     Intake/Output Summary (Last 24 hours) at 12/30/2022 1517 Last data filed at 12/30/2022 0051 Gross per 24 hour  Intake 0 ml  Output 800 ml  Net -800 ml     Assessment/Plan:  89 y.o. [name] is s/p left above knee amputation   4 Days Post-Op   -dressing removed and stump looks good.  Unable to get wrap back on stump.  Will order  stump sock.  -continue pain control -ok for discharge from vascular standpoint -will arrange appt for 4-5 week follow up in our office for staple removal on Dr. Stanford Breed clinic day.    [name] Locket, PA-C [facility] [phone] 12/30/2022 6:39 AM

## 2022-12-30 NOTE — Care Management Important Message (Signed)
Important Message  Patient Details  Name: [name] MRN: [id number] Date of Birth: Apr 25, 1931   Medicare Important Message Given:  Yes     Shelda Altes 12/30/2022, 10:13 AM

## 2022-12-30 NOTE — Progress Notes (Signed)
PROGRESS NOTE    KANDE DILLAHUNT  [id number] DOB: 1931/09/23 DOA: 12/22/2022 PCP: System, Provider Not In    Brief Narrative:  [name] is a 89 y.o. female with past medical history significant for DM2, HTN, chronic diastolic congestive heart failure, complete heart block s/p PPM, pulmonary hypertension who presented to [facility] ED via EMS from wound care clinic with left great toe wound.  Patient was seen in the clinic by Dr. Celine Ahr with notable oozing frank pus from her left great toe and was referred to the ED for potential need of amputation and angiography.    In the ED, temperature 98.0 F, HR 87, RR 13, BP 147/71, SpO2 94% on room air.  WBC 10.6, hemoglobin 13.5, platelets 342.  Sodium 131, potassium 2.8, chloride 88, CO2 31, glucose 128, BUN 50, creatinine 1.10.  AST 27, ALT 10, total bilirubin 0.5.  CRP 2.7, ESR 86.  Left foot x-ray with no evidence of osteomyelitis, marked soft tissue swelling of the foot, pes planus deformity and collapse of the talus possibly Charcot arthropathy.  Vascular surgery was consulted.  EDP repleted potassium and magnesium.  TRH consulted for admission for further evaluation and management of left great toe gangrene, hyponatremia, hypokalemia.   Assessment & Plan:   Left great toe gangrene 2/2 severe PAD Left lower extremity critical limb ischemia s/p AKA Vascular surgery was consulted and patient underwent aortogram, left lower extremity angiogram with findings of occlusion of popliteal artery, anterior tibial artery, tibioperoneal trunk, peroneal artery, posterior tibial artery, pedal circulation with no options for revascularization.  Patient underwent AKA by vascular surgery, Dr. Luan Pulling on 12/26/2022. -- Vascular surgery following, appreciate assistance.  Surgically stable as per surgery. -- Atorvastatin 80 mg p.o. daily -- Gabapentin 300 mg p.o. twice daily, increase to 400 mg twice daily. -- Norco 5-325 mg p.o. every 6 hours as needed moderate  pain, inadequate pain control today.  Increase to oxycodone 10 mg every 6 hours. -- Continue PT/OT, anticipate discharge back to SNF once pain is managed. --Add Flexeril 5 mg 3 times daily.  Hyponatremia: Resolved -- Na 131>>136>136>135>133 -- Continue to hold home furosemide/metolazone for now --Continue to encourage increased oral intake   Hypokalemia: Resolved Potassium 2.8 on admission.  Repleted. -- K 2.8>>3.8>3.7>3.6>4.0>2.9> 4.6.   Type 2 diabetes mellitus Hemoglobin A1c 7.0, at goal.  Home regimen includes Januvia 50 mg p.o. daily. -- Continue Tradjenta as hospital substitution -- SSI for coverage -- CBGs qAC/HS   Essential hypertension Chronic diastolic congestive heart failure Home regimen includes carvedilol 6.25 mg p.o. twice daily, furosemide 80 mg p.o. daily, metolazone 2.5 mg on M/T/W/T. -- Carvedilol 6.25 mg p.o. twice daily -- Holding home furosemide and metolazone due to hyponatremia, volume depletion as above -- Resume on discharge.   Hx complete heart block s/p PPM -- Continue monitor on telemetry -- Outpatient follow-up with cardiology.  Stable now.   Generalized weakness/debility/gait disturbance: Current resides at [facility].  She is a long-term nursing home resident.  Anticipate back to nursing home tomorrow.   Morbid obesity Body mass index is 38.79 kg/m.  Bedbound and nursing home.  Unlikely to lose weight.  Insufficient pain control today.  Work on pain management with oral pain medications and anticipate back to nursing home tomorrow.   DVT prophylaxis: SCD's Start: 12/26/22 1256 SCD's Start: 12/26/22 1256 enoxaparin (LOVENOX) injection 40 mg Start: 12/22/22 2030   Code Status: Full code Family Communication: Daughter at the bedside Disposition Plan: Status is:  Inpatient Remains inpatient appropriate because: Postop.  Still needing IV pain medications.     Consultants:  Vascular surgery   Procedures:  Left above-knee  amputation 2/5  Antimicrobials:  Perioperative antibiotics.  Completed.   Subjective: Patient seen in the morning rounds.  Daughter at the bedside.  Patient had a restless night and used multiple doses of IV Dilaudid overnight.  We agreed on trying different oral pain medications today. Start oxycodone 10 mg every 4 hours, Flexeril 5 mg 3 times a day, increase dose of gabapentin 400 mg twice daily.  Continue bowel regimen.  If adequate pain control on this regimen, anticipate discharge back tomorrow.  Objective: Vitals:   12/30/22 0613 12/30/22 0813 12/30/22 1059 12/30/22 1137  BP: (!) 141/57 (!) 124/46 (!) 119/42 (!) 119/51  Pulse: 79  77 76  Resp: 20 18 14 18  $ Temp: 98.1 F (36.7 C) 98 F (36.7 C) 98 F (36.7 C) 97.6 F (36.4 C)  TempSrc: Oral Oral Oral Oral  SpO2: 96% 100% 95% 99%  Weight:      Height:        Intake/Output Summary (Last 24 hours) at 12/30/2022 1347 Last data filed at 12/30/2022 0051 Gross per 24 hour  Intake 0 ml  Output 800 ml  Net -800 ml   Filed Weights   12/22/22 1430 12/22/22 2333  Weight: 92.4 kg 90.1 kg    Examination:  General exam: Appears calm and comfortable at the time of interview. Alert oriented to.  Appropriately responding.  Interactive.  Pleasant. Respiratory system: Clear to auscultation. Respiratory effort normal. Cardiovascular system: S1 & S2 heard, RRR.  Gastrointestinal system: Soft.  Tender.  Bowel sound present.  Obese and pendulous. Central nervous system: Alert and oriented. No focal neurological deficits.  Generalized weakness. Extremities: Symmetric 5 x 5 power. Skin: Left above-knee amputation stump clean and dry.  Intact.    Data Reviewed: I have personally reviewed following labs and imaging studies  CBC: Recent Labs  Lab 12/24/22 0119 12/26/22 0056 12/27/22 0119 12/30/22 0109  WBC 9.7 9.8 10.1 9.2  HGB 11.1* 10.9* 9.9* 8.9*  HCT 34.6* 32.9* 29.2* 28.6*  MCV 90.1 88.7 89.3 92.0  PLT 300 282 252 [id number]    Basic Metabolic Panel: Recent Labs  Lab 12/24/22 0119 12/26/22 0056 12/27/22 0119 12/28/22 0135 12/30/22 0109  NA 136 135 133* 133* 134*  K 3.7 3.6 4.0 3.9 4.6  CL 105 101 104 104 102  CO2 25 26 25 23 25  $ GLUCOSE 111* 82 128* 116* 107*  BUN 33* 17 16 22 $ 25*  CREATININE 1.02* 0.92 0.79 1.00 0.88  CALCIUM 8.6* 8.9 8.4* 8.4* 8.8*  MG 2.3 1.9 1.9  --   --    GFR: Estimated Creatinine Clearance: 43.3 mL/min (by C-G formula based on SCr of 0.88 mg/dL). Liver Function Tests: No results for input(s): "AST", "ALT", "ALKPHOS", "BILITOT", "PROT", "ALBUMIN" in the last 168 hours. No results for input(s): "LIPASE", "AMYLASE" in the last 168 hours. No results for input(s): "AMMONIA" in the last 168 hours. Coagulation Profile: No results for input(s): "INR", "PROTIME" in the last 168 hours. Cardiac Enzymes: No results for input(s): "CKTOTAL", "CKMB", "CKMBINDEX", "TROPONINI" in the last 168 hours. BNP (last 3 results) No results for input(s): "PROBNP" in the last 8760 hours. HbA1C: No results for input(s): "HGBA1C" in the last 72 hours. CBG: Recent Labs  Lab 12/29/22 1238 12/29/22 1539 12/29/22 2111 12/30/22 0612 12/30/22 1135  GLUCAP 105* 108* 131* 95 107*  Lipid Profile: No results for input(s): "CHOL", "HDL", "LDLCALC", "TRIG", "CHOLHDL", "LDLDIRECT" in the last 72 hours. Thyroid Function Tests: No results for input(s): "TSH", "T4TOTAL", "FREET4", "T3FREE", "THYROIDAB" in the last 72 hours. Anemia Panel: No results for input(s): "VITAMINB12", "FOLATE", "FERRITIN", "TIBC", "IRON", "RETICCTPCT" in the last 72 hours. Sepsis Labs: No results for input(s): "PROCALCITON", "LATICACIDVEN" in the last 168 hours.  No results found for this or any previous visit (from the past 240 hour(s)).       Radiology Studies: No results found.      Scheduled Meds:  atorvastatin  80 mg Oral Daily   carvedilol  6.25 mg Oral BID WC   cholecalciferol  2,000 Units Oral q AM    cyclobenzaprine  5 mg Oral TID   docusate sodium  100 mg Oral Daily   enoxaparin (LOVENOX) injection  40 mg Subcutaneous Q24H   gabapentin  400 mg Oral BID   insulin aspart  0-9 Units Subcutaneous TID WC   linagliptin  5 mg Oral Daily   melatonin  3 mg Oral QHS   pantoprazole  40 mg Oral Daily   polyethylene glycol  17 g Oral Daily   sodium chloride flush  3 mL Intravenous Q12H   Continuous Infusions:  sodium chloride     sodium chloride Stopped (12/28/22 0935)     LOS: 8 days    Time spent: 35 minutes    Barb Merino, MD [facility] Pager [phone]

## 2022-12-31 DIAGNOSIS — E876 Hypokalemia: Secondary | ICD-10-CM | POA: Diagnosis not present

## 2022-12-31 LAB — BASIC METABOLIC PANEL
Anion gap: 7 (ref 5–15)
BUN: 20 mg/dL (ref 8–23)
CO2: 23 mmol/L (ref 22–32)
Calcium: 8.6 mg/dL — ABNORMAL LOW (ref 8.9–10.3)
Chloride: 104 mmol/L (ref 98–111)
Creatinine, Ser: 0.76 mg/dL (ref 0.44–1.00)
GFR, Estimated: >60 mL/min (ref >60)
Glucose, Bld: 97 mg/dL (ref 70–99)
Potassium: 4.5 mmol/L (ref 3.5–5.1)
Sodium: 134 mmol/L — ABNORMAL LOW (ref 135–145)

## 2022-12-31 LAB — GLUCOSE, CAPILLARY: Glucose-Capillary: 94 mg/dL (ref 70–99)

## 2022-12-31 MED ORDER — GABAPENTIN 400 MG PO CAPS: 400.0000 mg | ORAL_CAPSULE | Freq: Two times a day (BID) | ORAL | 0 refills | Status: DC

## 2022-12-31 MED ORDER — CYCLOBENZAPRINE HCL 5 MG PO TABS: 5.0000 mg | ORAL_TABLET | Freq: Three times a day (TID) | ORAL | 0 refills | Status: AC | PRN

## 2022-12-31 MED ORDER — OXYCODONE HCL 5 MG PO TABS: 5.0000 mg | ORAL_TABLET | ORAL | 0 refills | Status: DC | PRN

## 2022-12-31 MED ORDER — ATORVASTATIN CALCIUM 80 MG PO TABS: 80.0000 mg | ORAL_TABLET | Freq: Every day | ORAL | 0 refills | Status: DC

## 2022-12-31 NOTE — Discharge Summary (Signed)
Physician Discharge Summary  DONESIA [name] [id number] DOB: [birth year] DOA: 12/22/2022  PCP: System, Provider Not In  Admit date: 12/22/2022 Discharge date: 12/31/2022  Admitted From: Long-term care care Disposition: Long-term care  Recommendations for Outpatient Follow-up:  Follow up with PCP in 1-2 weeks Please obtain BMP/CBC in one week Vascular surgery will schedule follow-up   Discharge Condition: Fair CODE STATUS: Full code Diet recommendation: Low-salt and low-carb diet  Discharge summary: [name] is a 89 y.o. female with past medical history significant for DM2, HTN, chronic diastolic congestive heart failure, complete heart block s/p PPM, pulmonary hypertension who presented to [facility] ED via EMS from wound care clinic with left great toe wound.  Patient was seen in the clinic by Dr. Celine Ahr with notable oozing frank pus from her left great toe and was referred to the ED for potential need of amputation and angiography.    In the ED, temperature 98.0 F, HR 87, RR 13, BP 147/71, SpO2 94% on room air.  WBC 10.6, hemoglobin 13.5, platelets 342.  Sodium 131, potassium 2.8, chloride 88, CO2 31, glucose 128, BUN 50, creatinine 1.10.  AST 27, ALT 10, total bilirubin 0.5.  CRP 2.7, ESR 86.  Left foot x-ray with no evidence of osteomyelitis, marked soft tissue swelling of the foot, pes planus deformity and collapse of the talus possibly Charcot arthropathy.  Vascular surgery was consulted.  EDP repleted potassium and magnesium.  TRH consulted for admission for further evaluation and management of left great toe gangrene, hyponatremia, hypokalemia.  Patient underwent left above-knee amputation and now clinically improved.  Plan of care:   Left great toe gangrene 2/2 severe PAD Left lower extremity critical limb ischemia s/p AKA Vascular surgery was consulted and patient underwent aortogram, left lower extremity angiogram with findings of occlusion of popliteal artery, anterior tibial  artery, tibioperoneal trunk, peroneal artery, posterior tibial artery, pedal circulation with no options for revascularization.  Patient underwent AKA by vascular surgery, Dr. Luan Pulling on 12/26/2022. -- Vascular surgery following, appreciate assistance.  Surgically stable as per surgery. -- Atorvastatin 80 mg p.o. daily -- Gabapentin 300 mg p.o. twice daily, increased to 400 mg twice daily. -- Oxycodone 5 to 10 mg for moderate and severe pain.  Continue aggressive bowel regimen.   --Added Flexeril 5 mg 3 times daily with good symptom control(patient is bedbound and no risk of fall) -- Transfer back to long-term nursing home today. -- Dry dressing and leg sleeve on the left stump.   Hyponatremia: Resolved -- Na 131>>136>136>135>133 -- Resume home furosemide/metolazone for now   Hypokalemia: Resolved Potassium 2.8 on admission.  Repleted. -- K 2.8>>3.8>3.7>3.6>4.0>2.9> 4.6.  Resume potassium supplementation along with diuretics.   Type 2 diabetes mellitus Hemoglobin A1c 7.0, at goal.  Home regimen includes Januvia 50 mg p.o. daily. -- Resume on discharge.   Essential hypertension Chronic diastolic congestive heart failure Home regimen includes carvedilol 6.25 mg p.o. twice daily, furosemide 80 mg p.o. daily, metolazone 2.5 mg on M/T/W/T. -- Resume on discharge.   Hx complete heart block s/p PPM -- Outpatient follow-up with cardiology.  Stable now.  Recently interrogated.   Generalized weakness/debility/gait disturbance: Current resides at [facility].  She is a long-term nursing home resident.  Not mobile.  Anticipate back to nursing home tomorrow.   Medically stable.  Adequate pain relief today.  Stable to discharge back to long-term care today.  Discharge Diagnoses:  Principal Problem:   Hypokalemia Active Problems:   Gangrene of toe of  left foot (HCC)   Hyponatremia   Dehydration   Chronic diastolic CHF (congestive heart failure) (HCC)   Essential hypertension   Type 2  diabetes mellitus with peripheral neuropathy [facility])    Discharge Instructions  Discharge Instructions     Diet - low sodium heart healthy   Complete by: As directed    Discharge wound care:   Complete by: As directed    Dry dressing and compression sleeve left leg stump   Increase activity slowly   Complete by: As directed       Allergies as of 12/31/2022       Reactions   Aspirin Rash, Other (See Comments)   GI distress   Tramadol Other (See Comments)   GI distress   Naproxen    Unknown reaction   Shellfish-derived Products Itching, Rash        Medication List     STOP taking these medications    HYDROcodone-acetaminophen 7.5-325 MG tablet Commonly known as: NORCO   vitamin A & D ointment       TAKE these medications    ammonium lactate 12 % cream Commonly known as: AMLACTIN Apply 1 Application topically in the morning and at bedtime.   ascorbic acid 500 MG tablet Commonly known as: VITAMIN C Take 500 mg by mouth 2 (two) times daily.   atorvastatin 80 MG tablet Commonly known as: LIPITOR Take 1 tablet (80 mg total) by mouth daily. Start taking on: January 01, 2023   BIOFREEZE COLORLESS EX Apply 1 application  topically in the morning, at noon, and at bedtime. for neck pain   Biotin 5000 MCG Caps Take 5,000 mcg by mouth daily. (0900)   carvedilol 6.25 MG tablet Commonly known as: COREG Take 6.25 mg by mouth 2 (two) times daily with a meal. ([id number])   CERTA-VITE PO Take 1 tablet by mouth daily. (0900)   cyclobenzaprine 5 MG tablet Commonly known as: FLEXERIL Take 1 tablet (5 mg total) by mouth 3 (three) times daily as needed for up to 7 days for muscle spasms.   furosemide 80 MG tablet Commonly known as: LASIX Take 80 mg by mouth in the morning. (0900)   gabapentin 400 MG capsule Commonly known as: NEURONTIN Take 1 capsule (400 mg total) by mouth 2 (two) times daily. What changed:  medication strength how much to  take additional instructions   lidocaine-prilocaine cream Commonly known as: EMLA Apply 1 Application topically every 8 (eight) hours as needed (for pain). Apply to great toe every 8 hours for pain   loratadine 10 MG tablet Commonly known as: CLARITIN Take 10 mg by mouth in the morning. (0900)   magnesium oxide 400 (241.3 Mg) MG tablet Commonly known as: MAG-OX TAKE ONE TABLET BY MOUTH ONE TIME DAILY   melatonin 3 MG Tabs tablet Take 3 mg by mouth at bedtime. (2100)   metolazone 2.5 MG tablet Commonly known as: ZAROXOLYN Take one tablet by mouth 4 days a week.  Take 30 minutes prior to furosemide What changed:  how much to take how to take this when to take this additional instructions   nitroGLYCERIN 0.4 MG SL tablet Commonly known as: NITROSTAT Place 0.4 mg under the tongue every 5 (five) minutes as needed for chest pain (X 3 DOSES FOR CHEST PAIN).   oxyCODONE 5 MG immediate release tablet Commonly known as: Oxy IR/ROXICODONE Take 1 tablet (5 mg total) by mouth every 4 (four) hours as needed for moderate pain or  severe pain (1 tablet for pain 4-6, 2 tablet for pain 7-10).   polyethylene glycol powder 17 GM/SCOOP powder Commonly known as: GLYCOLAX/MIRALAX Take 17 g by mouth daily. (0900)   potassium chloride 10 MEQ tablet Commonly known as: KLOR-CON Take 30 mEq by mouth 2 (two) times daily. (0900 & 2100)   Pro-Stat AWC Liqd Take 30 mLs by mouth daily.   sitaGLIPtin 50 MG tablet Commonly known as: JANUVIA Take 50 mg by mouth in the morning. (0900)   Vitamin D 50 MCG (2000 UT) Caps Take 2,000 Units by mouth in the morning. (0900)               Discharge Care Instructions  (From admission, onward)           Start     Ordered   12/31/22 0000  Discharge wound care:       Comments: Dry dressing and compression sleeve left leg stump   12/31/22 0949            Follow-up Information     [facility]  Follow up in 4 week(s).   Specialty: Vascular Surgery Why: Office will call you to arrange your appt (sent). Contact information: [address] [place] [address] [phone]               Allergies  Allergen Reactions   Aspirin Rash and Other (See Comments)    GI distress   Tramadol Other (See Comments)    GI distress   Naproxen     Unknown reaction   Shellfish-Derived Products Itching and Rash    Consultations: Vascular surgery Orthopedics   Procedures/Studies: PERIPHERAL VASCULAR CATHETERIZATION  Result Date: 12/23/2022 Table formatting from the original result was not included. DATE OF SERVICE: 12/23/2022  PATIENT:  [name]  89 y.o. female  PRE-OPERATIVE DIAGNOSIS:  Atherosclerosis of native arteries of left lower extremity causing gangrene  POST-OPERATIVE DIAGNOSIS:  Same  PROCEDURE:  1) Ultrasound guided right common femoral artery access 2) Aortogram 3) Left lower extremity angiogram with second order cannulation 4) Conscious sedation (43 minutes)   SURGEON:  [name]. Stanford Breed, MD  ASSISTANT: none  ANESTHESIA:   local and IV sedation  ESTIMATED BLOOD LOSS: minimal  LOCAL MEDICATIONS USED:  LIDOCAINE  COUNTS: confirmed correct.  PATIENT DISPOSITION:  PACU - hemodynamically stable.  Delay start of Pharmacological VTE agent (>24hrs) due to surgical blood loss or risk of bleeding: no  INDICATION FOR PROCEDURE: [name] is a 89 y.o. female with left foot gangrene and known history of peripheral arterial disease. After careful discussion of risks, benefits, and alternatives the patient was offered angiography. The patient understood and wished to proceed.  OPERATIVE FINDINGS: Terminal aorta and iliac arteries: Tortuous but widely patent  Left lower extremity: Common femoral artery: widely patent Profunda femoris artery: widely patent Superficial femoral artery: diffuse, severe disease proximally. Occludes mid thigh Popliteal artery: occluded Anterior  tibial artery: occluded Tibioperoneal trunk: occluded Peroneal artery: occluded Posterior tibial artery: occluded Pedal circulation: occluded  DESCRIPTION OF PROCEDURE: After identification of the patient in the pre-operative holding area, the patient was transferred to the operating room. The patient was positioned supine on the operating room table.  Anesthesia was induced. The groins was prepped and draped in standard fashion. A surgical pause was performed confirming correct patient, procedure, and operative location.  The right groin was anesthetized with subcutaneous injection of 1% lidocaine. Using ultrasound guidance, the right  common femoral artery was accessed with micropuncture technique. Fluoroscopy was used to confirm cannulation over the femoral head. The 19F sheath was upsized to 14F.  A Benson wire was advanced into the distal aorta. Over the wire an omni flush catheter was advanced to the level of L2. Aortogram was performed - see above for details.  The left common iliac artery was selected with an omniflush catheter and glidewire guidewire. The wire was advanced into the common femoral artery. Over the wire the omni flush catheter was advanced into the external iliac artery. Selective angiography was performed - see above for details.  A mynx device was used to close the arteriotomy. Hemostasis was excellent upon completion.  Conscious sedation was administered with the use of IV fentanyl and midazolam under continuous physician and nurse monitoring.  Heart rate, blood pressure, and oxygen saturation were continuously monitored.  Total sedation time was 43 minutes  Upon completion of the case instrument and sharps counts were confirmed correct. The patient was transferred to the PACU in good condition. I was present for all portions of the procedure.  PLAN: No options for revascularization. All of patient's named arteries below the thigh are occluded. Recommend palliative care evaluation. Her only  surgical options is an above knee amputation.  [name]. Stanford Breed, MD [facility] Phone Number: [phone] 12/23/2022 12:14 PM   DG Foot Complete Left  Result Date: 12/22/2022 CLINICAL DATA:  Concern for osteomyelitis of the great toe. EXAM: LEFT FOOT - COMPLETE 3+ VIEW COMPARISON:  None Available. FINDINGS: There is diffuse osteopenia. There is marked soft tissue swelling of the foot. There is no radiopaque foreign body. Evidence for fracture or dislocation. No cortical erosions are seen. Ulceration is seen overlying the plantar distal aspect of the first toe. There is pes planus deformity and collapse of the talus, possibly Charcot arthropathy. IMPRESSION: 1. No evidence of osteomyelitis. 2. Marked soft tissue swelling of the foot. 3. Pes planus deformity and collapse of the talus, possibly Charcot arthropathy. Electronically Signed   By: Ronney Asters M.D.   On: 12/22/2022 15:31   (Echo, Carotid, EGD, Colonoscopy, ERCP)    Subjective: Patient seen in the morning rounds.  Daughter at the bedside.  She had mild to moderate pain last night and used oxycodone 10 mg with good response.  Comfortable with plan to go back to nursing home.   Discharge Exam: Vitals:   12/31/22 0450 12/31/22 0800  BP:  (!) 153/63  Pulse:  71  Resp:  18  Temp:  98.2 F (36.8 C)  SpO2: 96% 97%   Vitals:   12/30/22 2348 12/31/22 0421 12/31/22 0450 12/31/22 0800  BP: (!) 150/60 (!) 152/57  (!) 153/63  Pulse: 60 78  71  Resp: 20 20  18  $ Temp:    98.2 F (36.8 C)  TempSrc:    Oral  SpO2: 96% 91% 96% 97%  Weight:      Height:        General: Pt is alert, awake, not in acute distress Morbidly obese patient.  Alert oriented x 4.  Interactive and pleasant to conversation. Cardiovascular: RRR, S1/S2 +, no rubs, no gallops Respiratory: CTA bilaterally, no wheezing, no rhonchi Abdominal: Soft, NT, ND, bowel sounds +, obese and pendulous. Extremities:  Left AKA stump clean and  dry.  Staples intact.    The results of significant diagnostics from this hospitalization (including imaging, microbiology, ancillary and laboratory) are listed below for reference.  Microbiology: No results found for this or any previous visit (from the past 240 hour(s)).   Labs: BNP (last 3 results) No results for input(s): "BNP" in the last 8760 hours. Basic Metabolic Panel: Recent Labs  Lab 12/26/22 0056 12/27/22 0119 12/28/22 0135 12/30/22 0109 12/31/22 0123  NA 135 133* 133* 134* 134*  K 3.6 4.0 3.9 4.6 4.5  CL 101 104 104 102 104  CO2 26 25 23 25 23  $ GLUCOSE 82 128* 116* 107* 97  BUN 17 16 22 $ 25* 20  CREATININE 0.92 0.79 1.00 0.88 0.76  CALCIUM 8.9 8.4* 8.4* 8.8* 8.6*  MG 1.9 1.9  --   --   --    Liver Function Tests: No results for input(s): "AST", "ALT", "ALKPHOS", "BILITOT", "PROT", "ALBUMIN" in the last 168 hours. No results for input(s): "LIPASE", "AMYLASE" in the last 168 hours. No results for input(s): "AMMONIA" in the last 168 hours. CBC: Recent Labs  Lab 12/26/22 0056 12/27/22 0119 12/30/22 0109  WBC 9.8 10.1 9.2  HGB 10.9* 9.9* 8.9*  HCT 32.9* 29.2* 28.6*  MCV 88.7 89.3 92.0  PLT 282 252 280   Cardiac Enzymes: No results for input(s): "CKTOTAL", "CKMB", "CKMBINDEX", "TROPONINI" in the last 168 hours. BNP: Invalid input(s): "POCBNP" CBG: Recent Labs  Lab 12/30/22 0612 12/30/22 1135 12/30/22 1548 12/30/22 2045 12/31/22 0619  GLUCAP 95 107* 150* 132* 94   D-Dimer No results for input(s): "DDIMER" in the last 72 hours. Hgb A1c No results for input(s): "HGBA1C" in the last 72 hours. Lipid Profile No results for input(s): "CHOL", "HDL", "LDLCALC", "TRIG", "CHOLHDL", "LDLDIRECT" in the last 72 hours. Thyroid function studies No results for input(s): "TSH", "T4TOTAL", "T3FREE", "THYROIDAB" in the last 72 hours.  Invalid input(s): "FREET3" Anemia work up No results for input(s): "VITAMINB12", "FOLATE", "FERRITIN", "TIBC", "IRON",  "RETICCTPCT" in the last 72 hours. Urinalysis    Component Value Date/Time   COLORURINE STRAW (A) 11/30/2020 1922   APPEARANCEUR CLEAR 11/30/2020 1922   LABSPEC 1.008 11/30/2020 1922   PHURINE 8.0 11/30/2020 1922   GLUCOSEU NEGATIVE 11/30/2020 1922   HGBUR NEGATIVE 11/30/2020 1922   BILIRUBINUR NEGATIVE 11/30/2020 [place] NEGATIVE 11/30/2020 1922   PROTEINUR NEGATIVE 11/30/2020 1922   UROBILINOGEN 0.2 08/06/2014 2250   NITRITE NEGATIVE 11/30/2020 1922   LEUKOCYTESUR NEGATIVE 11/30/2020 1922   Sepsis Labs Recent Labs  Lab 12/26/22 0056 12/27/22 0119 12/30/22 0109  WBC 9.8 10.1 9.2   Microbiology No results found for this or any previous visit (from the past 240 hour(s)).   Time coordinating discharge:  35 minutes  SIGNED:   Barb Merino, MD  [facility] 12/31/2022, 9:49 AM

## 2022-12-31 NOTE — TOC Transition Note (Signed)
Transition of Care [facility]) - CM/SW Discharge Note   Patient Details  Name: [name] MRN: [id number] Date of Birth: [birth year]  [facility] CM/SW Contact:  Bjorn Pippin, LCSW Phone Number: 12/31/2022, 12:00 PM   Clinical Narrative:    Patient will DC to: [facility] Anticipated DC date: 12/31/2022 Family notified: Basilia Jumbo  Transport by: Corey Harold   Per MD patient ready for DC to [facility]. RN, patient, patient's family, and facility notified of DC. Discharge Summary and FL2 sent to facility. RN to call report prior to discharge [phone] and room number is 117B. DC packet on chart. Ambulance transport requested for patient.   CSW will sign off for now as social work intervention is no longer needed. Please consult [place] again if new needs arise.    Final next level of care: Skilled Nursing Facility Barriers to Discharge: No Barriers Identified   Patient Goals and CMS Choice      Discharge Placement                Patient chooses bed at: [facility] Patient to be transferred to facility by: [place] Name of family member notified: Basilia Jumbo (daughter) Patient and family notified of of transfer: 12/31/22  Discharge Plan and Services Additional resources added to the After Visit Summary for                                       Social Determinants of Health (SDOH) Interventions SDOH Screenings   Tobacco Use: Low Risk  (12/27/2022)     Readmission Risk Interventions     No data to display          Beckey Rutter, MSW, LCSWA, LCASA Transitions of Care  Clinical Social Worker I

## 2022-12-31 NOTE — Progress Notes (Signed)
OT Cancellation Note  Patient Details Name: [name] MRN: [id number] DOB: [birth year]   Cancelled Treatment:    Reason Eval/Treat Not Completed: OT screened, no needs identified, will sign off (Pt was evaluated by OT on 2/5, per evaluation note, pt max-total A for all ADLs at bed level, resides long-term at SNF. Pt with no acute OT needs at this time, will s/o, please reconsult if there is a chang in pt status.)  Renaye Rakers, OTD, OTR/L SecureChat Preferred Acute Rehab [phone]   Ulla Gallo 12/31/2022, 7:36 AM

## 2022-12-31 NOTE — Progress Notes (Signed)
Patient c/o tingly feeling in her right leg to groin. Positive dorsalis pulse, sluggish in nature. Foot cool but patient has not had her foot under the covers. Patient has full sensation in right leg. Will continue to monitor

## 2023-01-03 NOTE — Progress Notes (Unsigned)
Cardiology Office Note   Date:  01/04/2023   ID:  [name], DOB [birth year], MRN [id number]  PCP:  System, Provider Not In    No chief complaint on file.  Chronic diastolic heart failure  Wt Readings from Last 3 Encounters:  01/04/23 187 lb (84.8 kg)  12/22/22 198 lb 10.2 oz (90.1 kg)  09/27/22 218 lb (98.9 kg)       History of Present Illness: [name] is a 89 y.o. female   Who previously saw Dr. Ron Parker.  She has chronic diastolic heart failure and permanent pacemaker placement. She is wheelchair-bound.   She is managed with furosemide and metolazone.    She is pacer dependent.      She went to the ER in Jan 2022 for confusion.  THought to be dehydrated with a possible UTI.    Negative head CT.  Hospitalized in 2/24: "Left great toe gangrene 2/2 severe PAD Left lower extremity critical limb ischemia s/p AKA Vascular surgery was consulted and patient underwent aortogram, left lower extremity angiogram with findings of occlusion of popliteal artery, anterior tibial artery, tibioperoneal trunk, peroneal artery, posterior tibial artery, pedal circulation with no options for revascularization.  Patient underwent AKA by vascular surgery, Dr. Luan Pulling on 12/26/2022. -- Vascular surgery following, appreciate assistance.  Surgically stable as per surgery. -- Atorvastatin 80 mg p.o. daily -- Gabapentin 300 mg p.o. twice daily, increased to 400 mg twice daily. -- Oxycodone 5 to 10 mg for moderate and severe pain.  Continue aggressive bowel regimen.   --Added Flexeril 5 mg 3 times daily with good symptom control(patient is bedbound and no risk of fall) -- Transfer back to long-term nursing home today. -- Dry dressing and leg sleeve on the left stump."  Denies : Chest pain. Dizziness. Nitroglycerin use. Orthopnea. Palpitations. Paroxysmal nocturnal dyspnea. Shortness of breath. Syncope.     Past Medical History:  Diagnosis Date   Arthritis    "qwhere"   AV block      2:1  January 14, 2011 / pacemaker plan when cellulitis resolved in length   Bradycardia    2:1  AV block   Cellulitis    Superficial cellulitis right lower leg   January 14, 2011   Chronic diastolic CHF (congestive heart failure) (HCC)    Diabetes mellitus without complication (HCC)    Edema    . EF 65-70%, echo, November 29, 2010   Ejection fraction    EF 65%, echo, January, 2012   Hypertension    Hypokalemia    October, 2012   Murmur    No significant valvular disease by echo, January, 2012   Pacemaker    January, 2013 Medtronic Sensa   Pulmonary hypertension (HCC)     46 mmHg... echo... January, 2012    Past Surgical History:  Procedure Laterality Date   ABDOMINAL AORTOGRAM W/LOWER EXTREMITY Left 12/23/2022   Procedure: ABDOMINAL AORTOGRAM W/LOWER EXTREMITY;  Surgeon: Cherre Robins, MD;  Location: [facility];  Service: Cardiovascular;  Laterality: Left;   AMPUTATION Left 12/26/2022   Procedure: AMPUTATION ABOVE KNEE;  Surgeon: Cherre Robins, MD;  Location: [facility];  Service: Vascular;  Laterality: Left;   APPLICATION OF WOUND VAC Left 12/26/2022   Procedure: APPLICATION OF WOUND VAC;  Surgeon: Cherre Robins, MD;  Location: [facility];  Service: Vascular;  Laterality: Left;   CHOLECYSTECTOMY     HIP ARTHROPLASTY Right    INSERT / REPLACE / REMOVE PACEMAKER  11/2011   PARTIAL HYSTERECTOMY     vaginal   PERMANENT PACEMAKER INSERTION N/A 12/09/2011   Procedure: PERMANENT PACEMAKER INSERTION;  Surgeon: Evans Lance, MD;  Location: [facility];  Service: Cardiovascular;  Laterality: N/A;   PPM GENERATOR CHANGEOUT N/A 01/03/2022   Procedure: PPM GENERATOR CHANGEOUT;  Surgeon: Evans Lance, MD;  Location: [facility];  Service: Cardiovascular;  Laterality: N/A;     Current Outpatient Medications  Medication Sig Dispense Refill   Amino Acids-Protein Hydrolys (PRO-STAT AWC) LIQD Take 30 mLs by mouth daily.     ammonium lactate (AMLACTIN) 12 % cream Apply 1  Application topically in the morning and at bedtime.     ascorbic acid (VITAMIN C) 500 MG tablet Take 500 mg by mouth 2 (two) times daily.     atorvastatin (LIPITOR) 80 MG tablet Take 1 tablet (80 mg total) by mouth daily. 30 tablet 0   Biotin 5000 MCG CAPS Take 5,000 mcg by mouth daily. (0900)     carvedilol (COREG) 6.25 MG tablet Take 6.25 mg by mouth 2 (two) times daily with a meal. (0900 & 1700)     Cholecalciferol (VITAMIN D) 50 MCG (2000 UT) CAPS Take 2,000 Units by mouth in the morning. (0900)     cyclobenzaprine (FLEXERIL) 5 MG tablet Take 1 tablet (5 mg total) by mouth 3 (three) times daily as needed for up to 7 days for muscle spasms. 21 tablet 0   furosemide (LASIX) 80 MG tablet Take 80 mg by mouth in the morning. (0900)     gabapentin (NEURONTIN) 400 MG capsule Take 1 capsule (400 mg total) by mouth 2 (two) times daily. 60 capsule 0   lidocaine-prilocaine (EMLA) cream Apply 1 Application topically every 8 (eight) hours as needed (for pain). Apply to great toe every 8 hours for pain     loratadine (CLARITIN) 10 MG tablet Take 10 mg by mouth in the morning. (0900)     magnesium oxide (MAG-OX) 400 (241.3 MG) MG tablet TAKE ONE TABLET BY MOUTH ONE TIME DAILY (Patient taking differently: Take 400 mg by mouth daily.) 30 tablet 4   melatonin 3 MG TABS tablet Take 3 mg by mouth at bedtime. (2100)     Menthol, Topical Analgesic, (BIOFREEZE COLORLESS EX) Apply 1 application  topically in the morning, at noon, and at bedtime. for neck pain     metolazone (ZAROXOLYN) 2.5 MG tablet Take one tablet by mouth 4 days a week.  Take 30 minutes prior to furosemide (Patient taking differently: Take 2.5 mg by mouth See admin instructions. Take one tablet by mouth 4 days a week Monday, Tuesday, Wednesday, Thursday.  Take 30 minutes prior to furosemide) 64 tablet 3   Multiple Vitamins-Minerals (CERTA-VITE PO) Take 1 tablet by mouth daily. (0900)     nitroGLYCERIN (NITROSTAT) 0.4 MG SL tablet Place 0.4 mg under  the tongue every 5 (five) minutes as needed for chest pain (X 3 DOSES FOR CHEST PAIN).     oxyCODONE (OXY IR/ROXICODONE) 5 MG immediate release tablet Take 1 tablet (5 mg total) by mouth every 4 (four) hours as needed for moderate pain or severe pain (1 tablet for pain 4-6, 2 tablet for pain 7-10). 20 tablet 0   polyethylene glycol powder (GLYCOLAX/MIRALAX) powder Take 17 g by mouth daily. (0900)     potassium chloride (KLOR-CON) 10 MEQ tablet Take 30 mEq by mouth 2 (two) times daily. (0900 & 2100)     sitaGLIPtin (JANUVIA) 50  MG tablet Take 50 mg by mouth in the morning. (0900)     No current facility-administered medications for this visit.    Allergies:   Aspirin, Tramadol, Naproxen, and Shellfish-derived products    Social History:  The patient  reports that she has never smoked. She has never used smokeless tobacco. She reports that she does not drink alcohol and does not use drugs.   Family History:  The patient's family history includes Cancer in her son; Cardiomyopathy in her father; Coronary artery disease in her son; Dementia in her mother; Diabetes in her father; Heart failure in her father.    ROS:  Please see the history of present illness.   Otherwise, review of systems are positive for left stump pain-improved.   All other systems are reviewed and negative.    PHYSICAL EXAM: VS:  BP (!) 156/82   Pulse 87   Ht 5' (1.524 m)   Wt 187 lb (84.8 kg)   SpO2 97%   BMI 36.52 kg/m  , BMI Body mass index is 36.52 kg/m. GEN: Well nourished, well developed, in no acute distress HEENT: normal Neck: no JVD, carotid bruits, or masses Cardiac: RRR, premature beats; no murmurs, rubs, or gallops,; right leg edema ; left AA with bandage Respiratory:  clear to auscultation bilaterally, normal work of breathing GI: soft, nontender, nondistended, + BS, obese MS: no deformity or atrophy Skin: warm and dry, no rash Neuro:  Strength and sensation are intact Psych: euthymic mood, full  affect   EKG:   The ekg ordered Feb 2024 demonstrates NSR, LBBB   Recent Labs: 12/22/2022: ALT 9 12/27/2022: Magnesium 1.9 12/30/2022: Hemoglobin 8.9; Platelets 280 12/31/2022: BUN 20; Creatinine, Ser 0.76; Potassium 4.5; Sodium 134   Lipid Panel    Component Value Date/Time   CHOL 173 12/24/2022 0119   TRIG 122 12/24/2022 0119   HDL 31 (L) 12/24/2022 0119   CHOLHDL 5.6 12/24/2022 0119   VLDL 24 12/24/2022 0119   LDLCALC 118 (H) 12/24/2022 0119     Other studies Reviewed: Additional studies/ records that were reviewed today with results demonstrating: hospital records reviewed; renal fnuctionand potassium normal at discharge on 12/31/22.   ASSESSMENT AND PLAN:  Chronic diastolic heart failure: Difficult to assess volume status due to obesity.  She has some right leg edema.  COntinue diuretics.  Pacemaker: Followed by EP.  Morbid obesity: Chronic issue.   Hypertension: borderline control. Having some stump pain from the surgical site which may be increasing BP.    Current medicines are reviewed at length with the patient today.  The patient concerns regarding her medicines were addressed.  The following changes have been made:  No change  Labs/ tests ordered today include:  No orders of the defined types were placed in this encounter.   Recommend 150 minutes/week of aerobic exercise Low fat, low carb, high fiber diet recommended  Disposition:   FU in 1 year   Signed, Larae Grooms, MD  01/04/2023 3:24 PM    [facility] [address], [place], [place]  [address] Phone: [phone]; Fax: [phone]

## 2023-01-04 ENCOUNTER — Ambulatory Visit: Payer: Medicare Other | Attending: Interventional Cardiology | Admitting: Interventional Cardiology

## 2023-01-04 ENCOUNTER — Encounter: Payer: Self-pay | Admitting: Interventional Cardiology

## 2023-01-04 ENCOUNTER — Ambulatory Visit: Payer: Medicare Other | Admitting: Interventional Cardiology

## 2023-01-04 VITALS — BP 156/82 | HR 87 | Ht 60.0 in | Wt 187.0 lb

## 2023-01-04 DIAGNOSIS — Z950 Presence of cardiac pacemaker: Secondary | ICD-10-CM | POA: Insufficient documentation

## 2023-01-04 DIAGNOSIS — I10 Essential (primary) hypertension: Secondary | ICD-10-CM | POA: Diagnosis present

## 2023-01-04 DIAGNOSIS — I5032 Chronic diastolic (congestive) heart failure: Secondary | ICD-10-CM | POA: Diagnosis present

## 2023-01-04 NOTE — Patient Instructions (Signed)
Medication Instructions:  Your physician recommends that you continue on your current medications as directed. Please refer to the Current Medication list given to you today.  *If you need a refill on your cardiac medications before your next appointment, please call your pharmacy*   Lab Work: none If you have labs (blood work) drawn today and your tests are completely normal, you will receive your results only by: [place] (if you have MyChart) OR A paper copy in the mail If you have any lab test that is abnormal or we need to change your treatment, we will call you to review the results.   Testing/Procedures: none   Follow-Up: At [facility], you and your health needs are our priority.  As part of our continuing mission to provide you with exceptional heart care, we have created designated Provider Care Teams.  These Care Teams include your primary Cardiologist (physician) and Advanced Practice Providers (APPs -  Physician Assistants and Nurse Practitioners) who all work together to provide you with the care you need, when you need it.  We recommend signing up for the patient portal called "MyChart".  Sign up information is provided on this After Visit Summary.  MyChart is used to connect with patients for Virtual Visits (Telemedicine).  Patients are able to view lab/test results, encounter notes, upcoming appointments, etc.  Non-urgent messages can be sent to your provider as well.   To learn more about what you can do with MyChart, go to [email or web address].    Your next appointment:   12 month(s)  Provider:   Larae Grooms, MD     Other Instructions Please schedule follow up with Dr Lovena Le (in recalls)

## 2023-01-09 ENCOUNTER — Ambulatory Visit: Payer: Medicare Other

## 2023-01-09 DIAGNOSIS — I442 Atrioventricular block, complete: Secondary | ICD-10-CM

## 2023-01-09 LAB — CUP PACEART REMOTE DEVICE CHECK
Battery Remaining Longevity: 113 mo
Battery Voltage: 3.05 V
Brady Statistic AP VP Percent: 12.28 %
Brady Statistic AP VS Percent: 0 %
Brady Statistic AS VP Percent: 87.71 %
Brady Statistic AS VS Percent: 0.01 %
Brady Statistic RA Percent Paced: 12.15 %
Brady Statistic RV Percent Paced: 99.99 %
Date Time Interrogation Session: 20240219040946
Implantable Lead Connection Status: 753985
Implantable Lead Connection Status: 753985
Implantable Lead Implant Date: 20130118
Implantable Lead Implant Date: 20130118
Implantable Lead Location: 753859
Implantable Lead Location: 753860
Implantable Lead Model: 5076
Implantable Lead Model: 5076
Implantable Pulse Generator Implant Date: 20230213
Lead Channel Impedance Value: 323 Ohm
Lead Channel Impedance Value: 342 Ohm
Lead Channel Impedance Value: 399 Ohm
Lead Channel Impedance Value: 399 Ohm
Lead Channel Pacing Threshold Amplitude: 1.125 V
Lead Channel Pacing Threshold Amplitude: 1.375 V
Lead Channel Pacing Threshold Pulse Width: 0.4 ms
Lead Channel Pacing Threshold Pulse Width: 0.4 ms
Lead Channel Sensing Intrinsic Amplitude: 2 mV
Lead Channel Sensing Intrinsic Amplitude: 2 mV
Lead Channel Sensing Intrinsic Amplitude: 3.375 mV
Lead Channel Sensing Intrinsic Amplitude: 3.375 mV
Lead Channel Setting Pacing Amplitude: 2.5 V
Lead Channel Setting Pacing Amplitude: 2.75 V
Lead Channel Setting Pacing Pulse Width: 0.4 ms
Lead Channel Setting Sensing Sensitivity: 2.8 mV
Zone Setting Status: 755011
Zone Setting Status: 755011

## 2023-01-16 ENCOUNTER — Telehealth: Payer: Self-pay

## 2023-01-16 NOTE — Telephone Encounter (Signed)
Belinda called from [facility] to move up pt's post op appt. Due to redness at incision and drainage, no odor. Pt has been given appt this week. I have left Belinda a VM asking her to call us back for the new appt/ date.

## 2023-01-17 ENCOUNTER — Ambulatory Visit (INDEPENDENT_AMBULATORY_CARE_PROVIDER_SITE_OTHER): Payer: Medicare Other | Admitting: Physician Assistant

## 2023-01-17 VITALS — BP 143/73 | HR 90 | Temp 98.6°F | Resp 20

## 2023-01-17 DIAGNOSIS — S78112A Complete traumatic amputation at level between left hip and knee, initial encounter: Secondary | ICD-10-CM

## 2023-01-17 DIAGNOSIS — I739 Peripheral vascular disease, unspecified: Secondary | ICD-10-CM

## 2023-01-17 NOTE — Progress Notes (Signed)
POST OPERATIVE OFFICE NOTE    CC:  F/u for surgery  HPI:  [name] is a 89 y.o. female who is s/p left above-knee amputation by Dr. Lenell Antu on 12/26/2022.  This was done for left lower extremity gangrene with no options for revascularization.  She presents today as a triage visit.  Her facility wanted [place] to evaluate issues with erythema and drainage of her left AKA incision.  The patient states that her left AKA has been fairly sore over the past couple days and draining "yellow" fluid.  She is unsure if there has been frank pus or not.  She has also noticed some redness around her incision site.  She denies any fever or chills.    Allergies  Allergen Reactions   Aspirin Rash and Other (See Comments)    GI distress   Tramadol Other (See Comments)    GI distress   Naproxen     Unknown reaction   Shellfish-Derived Products Itching and Rash    Current Outpatient Medications  Medication Sig Dispense Refill   Amino Acids-Protein Hydrolys (PRO-STAT AWC) LIQD Take 30 mLs by mouth daily.     ammonium lactate (AMLACTIN) 12 % cream Apply 1 Application topically in the morning and at bedtime.     ascorbic acid (VITAMIN C) 500 MG tablet Take 500 mg by mouth 2 (two) times daily.     atorvastatin (LIPITOR) 80 MG tablet Take 1 tablet (80 mg total) by mouth daily. 30 tablet 0   Biotin 5000 MCG CAPS Take 5,000 mcg by mouth daily. (0900)     carvedilol (COREG) 6.25 MG tablet Take 6.25 mg by mouth 2 (two) times daily with a meal. (0900 & 1700)     Cholecalciferol (VITAMIN D) 50 MCG (2000 UT) CAPS Take 2,000 Units by mouth in the morning. (0900)     furosemide (LASIX) 80 MG tablet Take 80 mg by mouth in the morning. (0900)     gabapentin (NEURONTIN) 400 MG capsule Take 1 capsule (400 mg total) by mouth 2 (two) times daily. 60 capsule 0   lidocaine-prilocaine (EMLA) cream Apply 1 Application topically every 8 (eight) hours as needed (for pain). Apply to great toe every 8 hours for pain      loratadine (CLARITIN) 10 MG tablet Take 10 mg by mouth in the morning. (0900)     magnesium oxide (MAG-OX) 400 (241.3 MG) MG tablet TAKE ONE TABLET BY MOUTH ONE TIME DAILY (Patient taking differently: Take 400 mg by mouth daily.) 30 tablet 4   melatonin 3 MG TABS tablet Take 3 mg by mouth at bedtime. (2100)     Menthol, Topical Analgesic, (BIOFREEZE COLORLESS EX) Apply 1 application  topically in the morning, at noon, and at bedtime. for neck pain     metolazone (ZAROXOLYN) 2.5 MG tablet Take one tablet by mouth 4 days a week.  Take 30 minutes prior to furosemide (Patient taking differently: Take 2.5 mg by mouth See admin instructions. Take one tablet by mouth 4 days a week Monday, Tuesday, Wednesday, Thursday.  Take 30 minutes prior to furosemide) 64 tablet 3   Multiple Vitamins-Minerals (CERTA-VITE PO) Take 1 tablet by mouth daily. (0900)     nitroGLYCERIN (NITROSTAT) 0.4 MG SL tablet Place 0.4 mg under the tongue every 5 (five) minutes as needed for chest pain (X 3 DOSES FOR CHEST PAIN).     oxyCODONE (OXY IR/ROXICODONE) 5 MG immediate release tablet Take 1 tablet (5 mg total) by mouth every 4 (four)  hours as needed for moderate pain or severe pain (1 tablet for pain 4-6, 2 tablet for pain 7-10). 20 tablet 0   polyethylene glycol powder (GLYCOLAX/MIRALAX) powder Take 17 g by mouth daily. (0900)     potassium chloride (KLOR-CON) 10 MEQ tablet Take 30 mEq by mouth 2 (two) times daily. (0900 & 2100)     sitaGLIPtin (JANUVIA) 50 MG tablet Take 50 mg by mouth in the morning. (0900)     No current facility-administered medications for this visit.     ROS:  See HPI  Physical Exam:  Incision: Left AKA incision intact.  Erythema and warmth around medial portion of stump.  Small area of drainage coming from the midline stump.  3 staples removed.  Mild serosanguineous drainage noted with no frank pus Neuro: intact motor and sensation of LLE    Assessment/Plan:  This is a 89 y.o. female who is s/p: L  AKA on 12/26/2022   -The patient has had some erythema and drainage from her left AKA over the past couple days.  Her stump is also felt more sore than usual. -On exam her incision is intact with no areas of dehiscence.  There is some erythema to the medial portion of the stump.  3 staples removed from the midline portion of the stump to encourage drainage.  Serosanguineous drainage was noted, but no frank pus.  Small area was packed with wet gauze and dressed with a dry gauze and tape. -The patient likely has an infection of her skin around the AKA.  At this will try to manage conservatively with antibiotics and dressing changes.  I have written recommendations to [facility] that they do daily dressing changes to the left AKA.  I am also placing the patient on a 7-day course of Keflex 500mg  3 times daily.  Dr. Lenell Antu was involved in the care plan for this patient -She will follow-up with our office in 1 week for repeat incision check   Loel Dubonnet, PA-C [facility] [phone]   Clinic MD:  Steve Rattler

## 2023-01-23 NOTE — Progress Notes (Unsigned)
POST OPERATIVE OFFICE NOTE    CC:  F/u for surgery  HPI:  This is a 89 y.o. female who is s/p  left above-knee amputation by Dr. Stanford Breed on 12/26/2022.  This was done for left lower extremity gangrene with no options for revascularization.   She was seen on 01/17/2023 and at that time, she had c/o being sore and having yellow drainage with some redness around the incision.  She was started on 7 days of Keflex and scheduled to return in one week.    Pt returns today for follow up.  Pt states ***   Allergies  Allergen Reactions   Aspirin Rash and Other (See Comments)    GI distress   Tramadol Other (See Comments)    GI distress   Naproxen     Unknown reaction   Shellfish-Derived Products Itching and Rash    Current Outpatient Medications  Medication Sig Dispense Refill   Amino Acids-Protein Hydrolys (PRO-STAT AWC) LIQD Take 30 mLs by mouth daily.     ammonium lactate (AMLACTIN) 12 % cream Apply 1 Application topically in the morning and at bedtime.     ascorbic acid (VITAMIN C) 500 MG tablet Take 500 mg by mouth 2 (two) times daily.     atorvastatin (LIPITOR) 80 MG tablet Take 1 tablet (80 mg total) by mouth daily. 30 tablet 0   Biotin 5000 MCG CAPS Take 5,000 mcg by mouth daily. (0900)     carvedilol (COREG) 6.25 MG tablet Take 6.25 mg by mouth 2 (two) times daily with a meal. (0900 & 1700)     Cholecalciferol (VITAMIN D) 50 MCG (2000 UT) CAPS Take 2,000 Units by mouth in the morning. (0900)     furosemide (LASIX) 80 MG tablet Take 80 mg by mouth in the morning. (0900)     gabapentin (NEURONTIN) 400 MG capsule Take 1 capsule (400 mg total) by mouth 2 (two) times daily. 60 capsule 0   lidocaine-prilocaine (EMLA) cream Apply 1 Application topically every 8 (eight) hours as needed (for pain). Apply to great toe every 8 hours for pain     loratadine (CLARITIN) 10 MG tablet Take 10 mg by mouth in the morning. (0900)     magnesium oxide (MAG-OX) 400 (241.3 MG) MG tablet TAKE ONE TABLET BY  MOUTH ONE TIME DAILY (Patient taking differently: Take 400 mg by mouth daily.) 30 tablet 4   melatonin 3 MG TABS tablet Take 3 mg by mouth at bedtime. (2100)     Menthol, Topical Analgesic, (BIOFREEZE COLORLESS EX) Apply 1 application  topically in the morning, at noon, and at bedtime. for neck pain     metolazone (ZAROXOLYN) 2.5 MG tablet Take one tablet by mouth 4 days a week.  Take 30 minutes prior to furosemide (Patient taking differently: Take 2.5 mg by mouth See admin instructions. Take one tablet by mouth 4 days a week Monday, Tuesday, Wednesday, Thursday.  Take 30 minutes prior to furosemide) 64 tablet 3   Multiple Vitamins-Minerals (CERTA-VITE PO) Take 1 tablet by mouth daily. (0900)     nitroGLYCERIN (NITROSTAT) 0.4 MG SL tablet Place 0.4 mg under the tongue every 5 (five) minutes as needed for chest pain (X 3 DOSES FOR CHEST PAIN).     oxyCODONE (OXY IR/ROXICODONE) 5 MG immediate release tablet Take 1 tablet (5 mg total) by mouth every 4 (four) hours as needed for moderate pain or severe pain (1 tablet for pain 4-6, 2 tablet for pain 7-10). 20 tablet 0  polyethylene glycol powder (GLYCOLAX/MIRALAX) powder Take 17 g by mouth daily. (0900)     potassium chloride (KLOR-CON) 10 MEQ tablet Take 30 mEq by mouth 2 (two) times daily. (0900 & 2100)     sitaGLIPtin (JANUVIA) 50 MG tablet Take 50 mg by mouth in the morning. (0900)     No current facility-administered medications for this visit.     ROS:  See HPI  Physical Exam:  ***  Incision:  *** Extremities:  *** Neuro: *** Abdomen:  ***    Assessment/Plan:  This is a 89 y.o. female who is s/p:  left above-knee amputation by Dr. Stanford Breed on 12/26/2022.  This was done for left lower extremity gangrene with no options for revascularization.   -***   Leontine Locket, [facility] [phone]   Clinic MD:  Stanford Breed

## 2023-01-24 ENCOUNTER — Ambulatory Visit (INDEPENDENT_AMBULATORY_CARE_PROVIDER_SITE_OTHER): Payer: Medicare Other | Admitting: Physician Assistant

## 2023-01-24 VITALS — BP 129/48 | HR 79

## 2023-01-24 DIAGNOSIS — Z89612 Acquired absence of left leg above knee: Secondary | ICD-10-CM

## 2023-01-26 ENCOUNTER — Telehealth: Payer: Self-pay

## 2023-01-26 NOTE — Telephone Encounter (Signed)
Aldona Bar RN, supervisor @ [employer], called stating that they did not receive any new orders after Tuesday's office visit and needed an update on wound care and meds.  Reviewed pt's chart, returned call for clarification, two identifiers used. Gave verbal orders for a continuation of Keflex 500 mg TID x 7 days, wound care orders, and f/u in 2 weeks, per Sam PA's note. Pt will start Keflex today. Confirmed understanding.

## 2023-02-07 ENCOUNTER — Ambulatory Visit (INDEPENDENT_AMBULATORY_CARE_PROVIDER_SITE_OTHER): Payer: Medicare Other | Admitting: Physician Assistant

## 2023-02-07 VITALS — BP 148/56 | HR 79 | Temp 97.5°F

## 2023-02-07 DIAGNOSIS — Z89612 Acquired absence of left leg above knee: Secondary | ICD-10-CM

## 2023-02-07 NOTE — Progress Notes (Signed)
POST OPERATIVE OFFICE NOTE    CC:  F/u for surgery  HPI:  This is a 89 y.o. female who is s/p left above-knee amputation by Dr. Stanford Breed on 12/26/2022.  This was done for left lower extremity gangrene with no options for revascularization.  She had drainage and was given antibiotics that she completed.   There is no drainage, no fever or chills.  She still has discomfort in the stump.     She is non ambulatory and is dependent on a WC for mobility.  She resides at a SNF.     Allergies  Allergen Reactions   Aspirin Rash and Other (See Comments)    GI distress   Tramadol Other (See Comments)    GI distress   Naproxen     Unknown reaction   Shellfish-Derived Products Itching and Rash    Current Outpatient Medications  Medication Sig Dispense Refill   Amino Acids-Protein Hydrolys (PRO-STAT AWC) LIQD Take 30 mLs by mouth daily.     ammonium lactate (AMLACTIN) 12 % cream Apply 1 Application topically in the morning and at bedtime.     ascorbic acid (VITAMIN C) 500 MG tablet Take 500 mg by mouth 2 (two) times daily.     Biotin 5000 MCG CAPS Take 5,000 mcg by mouth daily. (0900)     carvedilol (COREG) 6.25 MG tablet Take 6.25 mg by mouth 2 (two) times daily with a meal. (0900 & 1700)     Cholecalciferol (VITAMIN D) 50 MCG (2000 UT) CAPS Take 2,000 Units by mouth in the morning. (0900)     furosemide (LASIX) 80 MG tablet Take 80 mg by mouth in the morning. (0900)     lidocaine-prilocaine (EMLA) cream Apply 1 Application topically every 8 (eight) hours as needed (for pain). Apply to great toe every 8 hours for pain     loratadine (CLARITIN) 10 MG tablet Take 10 mg by mouth in the morning. (0900)     magnesium oxide (MAG-OX) 400 (241.3 MG) MG tablet TAKE ONE TABLET BY MOUTH ONE TIME DAILY (Patient taking differently: Take 400 mg by mouth daily.) 30 tablet 4   melatonin 3 MG TABS tablet Take 3 mg by mouth at bedtime. (2100)     Menthol, Topical Analgesic, (BIOFREEZE COLORLESS EX) Apply 1  application  topically in the morning, at noon, and at bedtime. for neck pain     metolazone (ZAROXOLYN) 2.5 MG tablet Take one tablet by mouth 4 days a week.  Take 30 minutes prior to furosemide (Patient taking differently: Take 2.5 mg by mouth See admin instructions. Take one tablet by mouth 4 days a week Monday, Tuesday, Wednesday, Thursday.  Take 30 minutes prior to furosemide) 64 tablet 3   Multiple Vitamins-Minerals (CERTA-VITE PO) Take 1 tablet by mouth daily. (0900)     nitroGLYCERIN (NITROSTAT) 0.4 MG SL tablet Place 0.4 mg under the tongue every 5 (five) minutes as needed for chest pain (X 3 DOSES FOR CHEST PAIN).     oxyCODONE (OXY IR/ROXICODONE) 5 MG immediate release tablet Take 1 tablet (5 mg total) by mouth every 4 (four) hours as needed for moderate pain or severe pain (1 tablet for pain 4-6, 2 tablet for pain 7-10). 20 tablet 0   polyethylene glycol powder (GLYCOLAX/MIRALAX) powder Take 17 g by mouth daily. (0900)     potassium chloride (KLOR-CON) 10 MEQ tablet Take 30 mEq by mouth 2 (two) times daily. (0900 & 2100)     sitaGLIPtin (JANUVIA) 50 MG  tablet Take 50 mg by mouth in the morning. (0900)     atorvastatin (LIPITOR) 80 MG tablet Take 1 tablet (80 mg total) by mouth daily. 30 tablet 0   gabapentin (NEURONTIN) 400 MG capsule Take 1 capsule (400 mg total) by mouth 2 (two) times daily. 60 capsule 0   No current facility-administered medications for this visit.     ROS:  See HPI  Physical Exam:    Incision:  well healed  Extremities:  appears viable  Right LE no wounds, no ischemic changes    Assessment/Plan:  This is a 89 y.o. female who is s/p:left above-knee amputation by Dr. Stanford Breed on 12/26/2022.   This was done for left lower extremity gangrene with no options for revascularization. F/U PRN.  If she develops ischemic changes to the right LE she will call our office.  Her family member was with her today and they agree.        Roxy Horseman PA-C [facility] [phone]   Clinic MD:  Stanford Breed

## 2023-02-20 NOTE — Progress Notes (Signed)
Remote pacemaker transmission.   

## 2023-02-27 ENCOUNTER — Ambulatory Visit: Payer: Medicare Other | Admitting: Interventional Cardiology

## 2023-04-03 ENCOUNTER — Emergency Department (HOSPITAL_COMMUNITY)
Admission: EM | Admit: 2023-04-03 | Discharge: 2023-04-03 | Disposition: A | Discharge status: Skilled Nursing Facility | Payer: Medicare Other | Attending: Emergency Medicine | Admitting: Emergency Medicine

## 2023-04-03 ENCOUNTER — Emergency Department (HOSPITAL_COMMUNITY): Payer: Medicare Other

## 2023-04-03 ENCOUNTER — Other Ambulatory Visit: Payer: Self-pay

## 2023-04-03 ENCOUNTER — Encounter (HOSPITAL_COMMUNITY): Payer: Self-pay

## 2023-04-03 DIAGNOSIS — W19XXXA Unspecified fall, initial encounter: Secondary | ICD-10-CM | POA: Insufficient documentation

## 2023-04-03 DIAGNOSIS — M545 Low back pain, unspecified: Secondary | ICD-10-CM

## 2023-04-03 NOTE — ED Triage Notes (Signed)
Patient had a fall last week and had an xray done, unsure of the results but patient sent into ED because she is still complaining of pain

## 2023-04-03 NOTE — ED Provider Notes (Signed)
[place] EMERGENCY DEPARTMENT AT [facility] Provider Note   CSN: [id number] Arrival date & time: 04/03/23  1429     History {Add pertinent medical, surgical, social history, OB history to HPI:1} Chief Complaint  Patient presents with   Back Pain    [name] is a 89 y.o. female.  She is brought in by ambulance from [facility] for evaluation of a fall.  She is not too sure of the details of the fall but it was during a transfer into a chair.  Since then she has had severe low back pain that sometimes radiates down her right leg.  No abdominal pain.  She is nonweightbearing at baseline, left AKA.  She had x-rays of her thoracic and lumbar spine, pelvis right hip femur that did not show any definite fracture.  Continues to complain of pain.  The history is provided by the patient.  Back Pain Location:  Lumbar spine Quality:  Stabbing Radiates to: right leg. Pain severity:  Severe Pain is:  Same all the time Onset quality:  Sudden Timing:  Constant Progression:  Unchanged Chronicity:  New Context: falling   Relieved by:  Nothing Worsened by:  Movement Ineffective treatments:  Narcotics Associated symptoms: leg pain   Associated symptoms: no abdominal pain, no chest pain, no dysuria and no fever        Home Medications Prior to Admission medications   Medication Sig Start Date End Date Taking? Authorizing Provider  Amino Acids-Protein Hydrolys (PRO-STAT AWC) LIQD Take 30 mLs by mouth daily.    [provider]  ammonium lactate (AMLACTIN) 12 % cream Apply 1 Application topically in the morning and at bedtime.    [provider]  ascorbic acid (VITAMIN C) 500 MG tablet Take 500 mg by mouth 2 (two) times daily.    [provider]  atorvastatin (LIPITOR) 80 MG tablet Take 1 tablet (80 mg total) by mouth daily. 01/01/23 01/31/23  Dorcas Carrow, MD  Biotin 5000 MCG CAPS Take 5,000 mcg by mouth daily. (0900)    [provider]   carvedilol (COREG) 6.25 MG tablet Take 6.25 mg by mouth 2 (two) times daily with a meal. (0900 & 1700)    [provider]  Cholecalciferol (VITAMIN D) 50 MCG (2000 UT) CAPS Take 2,000 Units by mouth in the morning. (0900)    [provider]  furosemide (LASIX) 80 MG tablet Take 80 mg by mouth in the morning. (0900) 09/19/17   [provider]  gabapentin (NEURONTIN) 400 MG capsule Take 1 capsule (400 mg total) by mouth 2 (two) times daily. 12/31/22 01/30/23  Dorcas Carrow, MD  lidocaine-prilocaine (EMLA) cream Apply 1 Application topically every 8 (eight) hours as needed (for pain). Apply to great toe every 8 hours for pain    [provider]  loratadine (CLARITIN) 10 MG tablet Take 10 mg by mouth in the morning. (0900)    [provider]  magnesium oxide (MAG-OX) 400 (241.3 MG) MG tablet TAKE ONE TABLET BY MOUTH ONE TIME DAILY Patient taking differently: Take 400 mg by mouth daily. 04/02/15   Luis Abed, MD  melatonin 3 MG TABS tablet Take 3 mg by mouth at bedtime. (2100)    [provider]  Menthol, Topical Analgesic, (BIOFREEZE COLORLESS EX) Apply 1 application  topically in the morning, at noon, and at bedtime. for neck pain    [provider]  metolazone (ZAROXOLYN) 2.5 MG tablet Take one tablet by mouth  4 days a week.  Take 30 minutes prior to furosemide Patient taking differently: Take 2.5 mg by mouth See admin instructions. Take one tablet by mouth 4 days a week Monday, Tuesday, Wednesday, Thursday.  Take 30 minutes prior to furosemide 12/13/21   Corky Crafts, MD  Multiple Vitamins-Minerals (CERTA-VITE PO) Take 1 tablet by mouth daily. (0900)    [provider]  nitroGLYCERIN (NITROSTAT) 0.4 MG SL tablet Place 0.4 mg under the tongue every 5 (five) minutes as needed for chest pain (X 3 DOSES FOR CHEST PAIN).    [provider]  oxyCODONE (OXY IR/ROXICODONE) 5 MG immediate release tablet Take 1 tablet (5  mg total) by mouth every 4 (four) hours as needed for moderate pain or severe pain (1 tablet for pain 4-6, 2 tablet for pain 7-10). 12/31/22   Dorcas Carrow, MD  polyethylene glycol powder (GLYCOLAX/MIRALAX) powder Take 17 g by mouth daily. (0900)    [provider]  potassium chloride (KLOR-CON) 10 MEQ tablet Take 30 mEq by mouth 2 (two) times daily. (0900 & 2100) 11/19/19   [provider]  sitaGLIPtin (JANUVIA) 50 MG tablet Take 50 mg by mouth in the morning. (0900)    [provider]      Allergies    Aspirin, Tramadol, Naproxen, and Shellfish-derived products    Review of Systems   Review of Systems  Constitutional:  Negative for fever.  Respiratory:  Negative for shortness of breath.   Cardiovascular:  Negative for chest pain.  Gastrointestinal:  Negative for abdominal pain.  Genitourinary:  Negative for dysuria.  Musculoskeletal:  Positive for back pain.    Physical Exam Updated Vital Signs BP (!) 119/56   Pulse 94   Temp 98.7 F (37.1 C) (Oral)   Resp 16   Ht 5' (1.524 m)   Wt 84.8 kg   SpO2 97%   BMI 36.51 kg/m  Physical Exam Vitals and nursing note reviewed.  Constitutional:      General: She is not in acute distress.    Appearance: Normal appearance. She is well-developed.  HENT:     Head: Normocephalic and atraumatic.  Eyes:     Conjunctiva/sclera: Conjunctivae normal.  Cardiovascular:     Rate and Rhythm: Normal rate and regular rhythm.     Heart sounds: No murmur heard. Pulmonary:     Effort: Pulmonary effort is normal. No respiratory distress.     Breath sounds: Normal breath sounds.  Abdominal:     Palpations: Abdomen is soft.     Tenderness: There is no abdominal tenderness.  Musculoskeletal:        General: Tenderness (diffuse lumbar) present.     Cervical back: Neck supple.     Comments: Left AKA  Skin:    General: Skin is warm and dry.     Capillary Refill: Capillary refill takes less than 2 seconds.  Neurological:      General: No focal deficit present.     Mental Status: She is alert. Mental status is at baseline.     ED Results / Procedures / Treatments   Labs (all labs ordered are listed, but only abnormal results are displayed) Labs Reviewed - No data to display  EKG None  Radiology No results found.  Procedures Procedures  {Document cardiac monitor, telemetry assessment procedure when appropriate:1}  Medications Ordered in ED Medications - No data to display  ED Course/ Medical Decision Making/ A&P   {   Click here for ABCD2, HEART  and other calculatorsREFRESH Note before signing :1}                          Medical Decision Making Amount and/or Complexity of Data Reviewed Radiology: ordered.   This patient complains of ***; this involves an extensive number of treatment Options and is a complaint that carries with it a high risk of complications and morbidity. The differential includes ***  I ordered, reviewed and interpreted labs, which included *** I ordered medication *** and reviewed PMP when indicated. I ordered imaging studies which included *** and I independently    visualized and interpreted imaging which showed *** Additional history obtained from *** Previous records obtained and reviewed *** I consulted *** and discussed lab and imaging findings and discussed disposition.  Cardiac monitoring reviewed, *** Social determinants considered, *** Critical Interventions: ***  After the interventions stated above, I reevaluated the patient and found *** Admission and further testing considered, ***   {Document critical care time when appropriate:1} {Document review of labs and clinical decision tools ie heart score, Chads2Vasc2 etc:1}  {Document your independent review of radiology images, and any outside records:1} {Document your discussion with family members, caretakers, and with consultants:1} {Document social determinants of health affecting pt's  care:1} {Document your decision making why or why not admission, treatments were needed:1} Final Clinical Impression(s) / ED Diagnoses Final diagnoses:  None    Rx / DC Orders ED Discharge Orders     None

## 2023-04-03 NOTE — ED Notes (Signed)
Secretary aware that pt need transportation back to [facility].

## 2023-04-03 NOTE — Discharge Instructions (Signed)
You are seen in the emergency department for severe back pain after a fall.  You had a CAT scan of your thoracic and lumbar spine that did not show any fracture.  There was significant degenerative changes.  Please follow-up with your regular healthcare team.  Pain medication as needed.  Return to the emergency department if any worsening or concerning symptoms.  Included below is the report of the CAT scans:  IMPRESSION:  1. No acute fracture or traumatic listhesis in the thoracic or  lumbar spine.  2. Multilevel degenerative changes, with multiple fused vertebral  bodies and facet.  3. Mild spinal canal stenosis at T5-T6. Mild neural foraminal  narrowing bilaterally at T1-T2. Moderate to severe neural foraminal  narrowing bilaterally at T10-T11.  4. Moderate spinal canal stenosis at L2-L3 and mild-to-moderate  spinal canal stenosis at L4-L5.  5. Mild bilateral neural foraminal narrowing at L2-L3, L3-L4, and  L5-S1.

## 2023-04-03 NOTE — ED Triage Notes (Signed)
Patient too a 10 mg oral oxycodone at 1250 today

## 2023-04-10 ENCOUNTER — Ambulatory Visit: Payer: Medicare Other

## 2023-04-10 DIAGNOSIS — I442 Atrioventricular block, complete: Secondary | ICD-10-CM | POA: Diagnosis not present

## 2023-04-10 LAB — CUP PACEART REMOTE DEVICE CHECK
Battery Remaining Longevity: 129 mo
Battery Voltage: 3.03 V
Brady Statistic AP VP Percent: 9.77 %
Brady Statistic AP VS Percent: 0 %
Brady Statistic AS VP Percent: 90.21 %
Brady Statistic AS VS Percent: 0.02 %
Brady Statistic RA Percent Paced: 9.62 %
Brady Statistic RV Percent Paced: 99.98 %
Date Time Interrogation Session: 20240520023544
Implantable Lead Connection Status: 753985
Implantable Lead Connection Status: 753985
Implantable Lead Implant Date: 20130118
Implantable Lead Implant Date: 20130118
Implantable Lead Location: 753859
Implantable Lead Location: 753860
Implantable Lead Model: 5076
Implantable Lead Model: 5076
Implantable Pulse Generator Implant Date: 20230213
Lead Channel Impedance Value: 361 Ohm
Lead Channel Impedance Value: 399 Ohm
Lead Channel Impedance Value: 418 Ohm
Lead Channel Impedance Value: 418 Ohm
Lead Channel Pacing Threshold Amplitude: 0.875 V
Lead Channel Pacing Threshold Amplitude: 1.125 V
Lead Channel Pacing Threshold Pulse Width: 0.4 ms
Lead Channel Pacing Threshold Pulse Width: 0.4 ms
Lead Channel Sensing Intrinsic Amplitude: 1.125 mV
Lead Channel Sensing Intrinsic Amplitude: 1.125 mV
Lead Channel Sensing Intrinsic Amplitude: 3.375 mV
Lead Channel Sensing Intrinsic Amplitude: 3.375 mV
Lead Channel Setting Pacing Amplitude: 2 V
Lead Channel Setting Pacing Amplitude: 2.25 V
Lead Channel Setting Pacing Pulse Width: 0.4 ms
Lead Channel Setting Sensing Sensitivity: 2.8 mV
Zone Setting Status: 755011
Zone Setting Status: 755011

## 2023-05-02 ENCOUNTER — Ambulatory Visit: Payer: Medicare Other | Attending: Internal Medicine | Admitting: Internal Medicine

## 2023-05-02 ENCOUNTER — Encounter: Payer: Self-pay | Admitting: Internal Medicine

## 2023-05-02 VITALS — BP 132/66 | HR 93 | Ht 60.0 in | Wt 186.0 lb

## 2023-05-02 DIAGNOSIS — I10 Essential (primary) hypertension: Secondary | ICD-10-CM

## 2023-05-02 DIAGNOSIS — Z950 Presence of cardiac pacemaker: Secondary | ICD-10-CM | POA: Diagnosis not present

## 2023-05-02 DIAGNOSIS — I442 Atrioventricular block, complete: Secondary | ICD-10-CM | POA: Diagnosis not present

## 2023-05-02 DIAGNOSIS — I5032 Chronic diastolic (congestive) heart failure: Secondary | ICD-10-CM

## 2023-05-02 LAB — CUP PACEART INCLINIC DEVICE CHECK
Battery Remaining Longevity: 115 mo
Battery Voltage: 3.03 V
Brady Statistic AP VP Percent: 12.28 %
Brady Statistic AP VS Percent: 0 %
Brady Statistic AS VP Percent: 87.71 %
Brady Statistic AS VS Percent: 0.01 %
Brady Statistic RA Percent Paced: 12.19 %
Brady Statistic RV Percent Paced: 99.99 %
Date Time Interrogation Session: 20240611163042
Implantable Lead Connection Status: 753985
Implantable Lead Connection Status: 753985
Implantable Lead Implant Date: 20130118
Implantable Lead Implant Date: 20130118
Implantable Lead Location: 753859
Implantable Lead Location: 753860
Implantable Lead Model: 5076
Implantable Lead Model: 5076
Implantable Pulse Generator Implant Date: 20230213
Lead Channel Impedance Value: 342 Ohm
Lead Channel Impedance Value: 380 Ohm
Lead Channel Impedance Value: 399 Ohm
Lead Channel Impedance Value: 437 Ohm
Lead Channel Pacing Threshold Amplitude: 1.125 V
Lead Channel Pacing Threshold Amplitude: 1.25 V
Lead Channel Pacing Threshold Pulse Width: 0.4 ms
Lead Channel Pacing Threshold Pulse Width: 0.4 ms
Lead Channel Sensing Intrinsic Amplitude: 1.25 mV
Lead Channel Sensing Intrinsic Amplitude: 1.375 mV
Lead Channel Sensing Intrinsic Amplitude: 3.375 mV
Lead Channel Sensing Intrinsic Amplitude: 3.375 mV
Lead Channel Setting Pacing Amplitude: 2.25 V
Lead Channel Setting Pacing Amplitude: 2.5 V
Lead Channel Setting Pacing Pulse Width: 0.4 ms
Lead Channel Setting Sensing Sensitivity: 2.8 mV
Zone Setting Status: 755011
Zone Setting Status: 755011

## 2023-05-02 NOTE — Patient Instructions (Addendum)
Medication Instructions:  Your physician recommends that you continue on your current medications as directed. Please refer to the Current Medication list given to you today.  *If you need a refill on your cardiac medications before your next appointment, please call your pharmacy*  Lab Work: None ordered.  If you have labs (blood work) drawn today and your tests are completely normal, you will receive your results only by: MyChart Message (if you have MyChart) OR A paper copy in the mail If you have any lab test that is abnormal or we need to change your treatment, we will call you to review the results.  Testing/Procedures: None ordered.  Follow-Up: At [facility], you and your health needs are our priority.  As part of our continuing mission to provide you with exceptional heart care, we have created designated Provider Care Teams.  These Care Teams include your primary Cardiologist (physician) and Advanced Practice Providers (APPs -  Physician Assistants and Nurse Practitioners) who all work together to provide you with the care you need, when you need it.  Your next appointment:   1 year(s)  The format for your next appointment:   In Person  Provider:   Lewayne Bunting, MD{or one of the following Advanced Practice Providers on your designated Care Team:   Francis Dowse, [place] Casimiro Needle "Mardelle Matte" Lanna Poche, [place]  Remote monitoring is used to monitor your Pacemaker from home. This monitoring reduces the number of office visits required to check your device to one time per year. It allows [place] to keep an eye on the functioning of your device to ensure it is working properly. You are scheduled for a device check from home on 8/19. You may send your transmission at any time that day. If you have a wireless device, the transmission will be sent automatically. After your physician reviews your transmission, you will receive a postcard with your next transmission date.

## 2023-05-02 NOTE — Progress Notes (Signed)
HPI [name] returns today for followup. She is a pleasant 89 yo woman, with a h/o CHB, s/p PPM chronic, venous stasis, s/p left AKA, now wheelchair bound who present for ongoing evaluation of her PPM and CHB. She denies chest pain and her dyspnea is now wheelchair bound. She has undergone PPM gen change out. No trouble healing. She is concerned after losing her left leg about losing her right leg.  Allergies  Allergen Reactions   Aspirin Rash and Other (See Comments)    GI distress   Tramadol Other (See Comments)    GI distress   Naproxen     Unknown reaction   Shellfish-Derived Products Itching and Rash     Current Outpatient Medications  Medication Sig Dispense Refill   Amino Acids-Protein Hydrolys (PRO-STAT AWC) LIQD Take 30 mLs by mouth daily.     ammonium lactate (AMLACTIN) 12 % cream Apply 1 Application topically in the morning and at bedtime.     ascorbic acid (VITAMIN C) 500 MG tablet Take 500 mg by mouth 2 (two) times daily.     Biotin 5000 MCG CAPS Take 5,000 mcg by mouth daily. (0900)     carvedilol (COREG) 6.25 MG tablet Take 6.25 mg by mouth 2 (two) times daily with a meal. (0900 & 1700)     Cholecalciferol (VITAMIN D) 50 MCG (2000 UT) CAPS Take 2,000 Units by mouth in the morning. (0900)     furosemide (LASIX) 80 MG tablet Take 80 mg by mouth in the morning. (0900)     lidocaine-prilocaine (EMLA) cream Apply 1 Application topically every 8 (eight) hours as needed (for pain). Apply to great toe every 8 hours for pain     loratadine (CLARITIN) 10 MG tablet Take 10 mg by mouth in the morning. (0900)     magnesium oxide (MAG-OX) 400 (241.3 MG) MG tablet TAKE ONE TABLET BY MOUTH ONE TIME DAILY (Patient taking differently: Take 400 mg by mouth daily.) 30 tablet 4   melatonin 3 MG TABS tablet Take 3 mg by mouth at bedtime. (2100)     Menthol, Topical Analgesic, (BIOFREEZE COLORLESS EX) Apply 1 application  topically in the morning, at noon, and at bedtime. for neck pain      metolazone (ZAROXOLYN) 2.5 MG tablet Take one tablet by mouth 4 days a week.  Take 30 minutes prior to furosemide (Patient taking differently: Take 2.5 mg by mouth See admin instructions. Take one tablet by mouth 4 days a week Monday, Tuesday, Wednesday, Thursday.  Take 30 minutes prior to furosemide) 64 tablet 3   Multiple Vitamins-Minerals (CERTA-VITE PO) Take 1 tablet by mouth daily. (0900)     nitroGLYCERIN (NITROSTAT) 0.4 MG SL tablet Place 0.4 mg under the tongue every 5 (five) minutes as needed for chest pain (X 3 DOSES FOR CHEST PAIN).     oxyCODONE (OXY IR/ROXICODONE) 5 MG immediate release tablet Take 1 tablet (5 mg total) by mouth every 4 (four) hours as needed for moderate pain or severe pain (1 tablet for pain 4-6, 2 tablet for pain 7-10). 20 tablet 0   polyethylene glycol powder (GLYCOLAX/MIRALAX) powder Take 17 g by mouth daily. (0900)     potassium chloride (KLOR-CON) 10 MEQ tablet Take 30 mEq by mouth 2 (two) times daily. (0900 & 2100)     sitaGLIPtin (JANUVIA) 50 MG tablet Take 50 mg by mouth in the morning. (0900)     atorvastatin (LIPITOR) 80 MG tablet Take 1 tablet (  80 mg total) by mouth daily. 30 tablet 0   gabapentin (NEURONTIN) 400 MG capsule Take 1 capsule (400 mg total) by mouth 2 (two) times daily. 60 capsule 0   No current facility-administered medications for this visit.     Past Medical History:  Diagnosis Date   Arthritis    "qwhere"   AV block     2:1  January 14, 2011 / pacemaker plan when cellulitis resolved in length   Bradycardia    2:1  AV block   Cellulitis    Superficial cellulitis right lower leg   January 14, 2011   Chronic diastolic CHF (congestive heart failure) (HCC)    Diabetes mellitus without complication (HCC)    Edema    . EF 65-70%, echo, November 29, 2010   Ejection fraction    EF 65%, echo, January, 2012   Hypertension    Hypokalemia    October, 2012   Murmur    No significant valvular disease by echo, January, 2012    Pacemaker    January, 2013 Medtronic Sensa   Pulmonary hypertension (HCC)     46 mmHg... echo... January, 2012    ROS:   All systems reviewed and negative except as noted in the HPI.   Past Surgical History:  Procedure Laterality Date   ABDOMINAL AORTOGRAM W/LOWER EXTREMITY Left 12/23/2022   Procedure: ABDOMINAL AORTOGRAM W/LOWER EXTREMITY;  Surgeon: Leonie Douglas, MD;  Location: [facility] INVASIVE CV LAB;  Service: Cardiovascular;  Laterality: Left;   AMPUTATION Left 12/26/2022   Procedure: AMPUTATION ABOVE KNEE;  Surgeon: Leonie Douglas, MD;  Location[facility];  Service: Vascular;  Laterality: Left;   APPLICATION OF WOUND VAC Left 12/26/2022   Procedure: APPLICATION OF WOUND VAC;  Surgeon: Leonie Douglas, MD;  Location: [facility] OR;  Service: Vascular;  Laterality: Left;   CHOLECYSTECTOMY     HIP ARTHROPLASTY Right    INSERT / REPLACE / REMOVE PACEMAKER  11/2011   PARTIAL HYSTERECTOMY     vaginal   PERMANENT PACEMAKER INSERTION N/A 12/09/2011   Procedure: PERMANENT PACEMAKER INSERTION;  Surgeon: Marinus Maw, MD;  Location: [facility] CATH LAB;  Service: Cardiovascular;  Laterality: N/A;   PPM GENERATOR CHANGEOUT N/A 01/03/2022   Procedure: PPM GENERATOR CHANGEOUT;  Surgeon: Marinus Maw, MD;  Location: [facility] INVASIVE CV LAB;  Service: Cardiovascular;  Laterality: N/A;     Family History  Problem Relation Age of Onset   Cardiomyopathy Father    Diabetes Father    Heart failure Father    Dementia Mother    Cancer Son    Coronary artery disease Son    Heart attack Neg Hx      Social History   Socioeconomic History   Marital status: Widowed    Spouse name: Not on file   Number of children: Not on file   Years of education: Not on file   Highest education level: Not on file  Occupational History   Occupation: Retired    Associate Professor: RETIRED  Tobacco Use   Smoking status: Never   Smokeless tobacco: Never  Vaping Use   Vaping Use: Never used  Substance and Sexual Activity   Alcohol use:  No   Drug use: No   Sexual activity: Never  Other Topics Concern   Not on file  Social History Narrative   Retired    Widowed    Tobacco Use - No.    Alcohol Use - no   Regular Exercise - no  Drug Use - no   Social Determinants of Health   Financial Resource Strain: Not on file  Food Insecurity: Not on file  Transportation Needs: Not on file  Physical Activity: Not on file  Stress: Not on file  Social Connections: Not on file  Intimate Partner Violence: Not on file     BP 132/66   Pulse 93   Ht 5' (1.524 m)   Wt 186 lb (84.4 kg)   SpO2 96%   BMI 36.33 kg/m   Physical Exam:  Well appearing NAD HEENT: Unremarkable Neck:  No JVD, no thyromegally Lymphatics:  No adenopathy Back:  No CVA tenderness Lungs:  Clear HEART:  Regular rate rhythm, no murmurs, no rubs, no clicks Abd:  soft, positive bowel sounds, no organomegally, no rebound, no guarding Ext:  2 plus pulses, no edema, no cyanosis, no clubbing Skin:  No rashes no nodules Neuro:  CN II through XII intact, motor grossly intact  EKG - noise artifact.  P synch ventricular pacing  DEVICE  Normal device function.  See PaceArt for details.   Assess/Plan: 1. CHB -she is asymptomatic, s/p PPM insertion. 2. PPM - her Medtronic DDD PM is working normally. 3. HTN - her blood pressure is well controlled. She will continue her current meds. 4. Chronic diastolic heart failure - her lasix dose is stable and she will maintain a low sodium diet.   Sharlot Gowda Raziel Koenigs,MD

## 2023-05-04 NOTE — Addendum Note (Signed)
Addended by: Anselm Pancoast on: 05/04/2023 10:38 AM   Modules accepted: Orders

## 2023-05-05 NOTE — Progress Notes (Signed)
Remote pacemaker transmission.   

## 2023-05-08 ENCOUNTER — Encounter (HOSPITAL_COMMUNITY): Payer: Self-pay | Admitting: Emergency Medicine

## 2023-05-08 ENCOUNTER — Other Ambulatory Visit: Payer: Self-pay

## 2023-05-08 ENCOUNTER — Emergency Department (HOSPITAL_COMMUNITY): Payer: Medicare Other

## 2023-05-08 ENCOUNTER — Emergency Department (HOSPITAL_COMMUNITY)
Admission: EM | Admit: 2023-05-08 | Discharge: 2023-05-09 | Disposition: A | Discharge status: Skilled Nursing Facility | Payer: Medicare Other | Attending: Emergency Medicine | Admitting: Emergency Medicine

## 2023-05-08 DIAGNOSIS — R0602 Shortness of breath: Secondary | ICD-10-CM

## 2023-05-08 DIAGNOSIS — R4182 Altered mental status, unspecified: Secondary | ICD-10-CM | POA: Insufficient documentation

## 2023-05-08 LAB — CBC WITH DIFFERENTIAL/PLATELET
Abs Immature Granulocytes: 0.04 10*3/uL (ref 0.00–0.07)
Basophils Absolute: 0 10*3/uL (ref 0.0–0.1)
Basophils Relative: 0 %
Eosinophils Absolute: 0.6 10*3/uL — ABNORMAL HIGH (ref 0.0–0.5)
Eosinophils Relative: 6 %
HCT: 39.1 % (ref 36.0–46.0)
Hemoglobin: 12.7 g/dL (ref 12.0–15.0)
Immature Granulocytes: 0 %
Lymphocytes Relative: 14 %
Lymphs Abs: 1.3 10*3/uL (ref 0.7–4.0)
MCH: 26.4 pg (ref 26.0–34.0)
MCHC: 32.5 g/dL (ref 30.0–36.0)
MCV: 81.3 fL (ref 80.0–100.0)
Monocytes Absolute: 0.6 10*3/uL (ref 0.1–1.0)
Monocytes Relative: 7 %
Neutro Abs: 6.7 10*3/uL (ref 1.7–7.7)
Neutrophils Relative %: 73 %
Platelets: 342 10*3/uL (ref 150–400)
RBC: 4.81 MIL/uL (ref 3.87–5.11)
RDW: 17.4 % — ABNORMAL HIGH (ref 11.5–15.5)
WBC: 9.2 10*3/uL (ref 4.0–10.5)
nRBC: 0 % (ref 0.0–0.2)

## 2023-05-08 LAB — COMPREHENSIVE METABOLIC PANEL
ALT: 10 U/L (ref 0–44)
AST: 21 U/L (ref 15–41)
Albumin: 3 g/dL — ABNORMAL LOW (ref 3.5–5.0)
Alkaline Phosphatase: 78 U/L (ref 38–126)
Anion gap: 9 (ref 5–15)
BUN: 39 mg/dL — ABNORMAL HIGH (ref 8–23)
CO2: 29 mmol/L (ref 22–32)
Calcium: 9.2 mg/dL (ref 8.9–10.3)
Chloride: 90 mmol/L — ABNORMAL LOW (ref 98–111)
Creatinine, Ser: 1.08 mg/dL — ABNORMAL HIGH (ref 0.44–1.00)
GFR, Estimated: 49 mL/min — ABNORMAL LOW (ref >60)
Glucose, Bld: 124 mg/dL — ABNORMAL HIGH (ref 70–99)
Potassium: 2.8 mmol/L — ABNORMAL LOW (ref 3.5–5.1)
Sodium: 128 mmol/L — ABNORMAL LOW (ref 135–145)
Total Bilirubin: 0.5 mg/dL (ref 0.3–1.2)
Total Protein: 7.8 g/dL (ref 6.5–8.1)

## 2023-05-08 LAB — TROPONIN I (HIGH SENSITIVITY)
Troponin I (High Sensitivity): 17 ng/L (ref <18)
Troponin I (High Sensitivity): 17 ng/L (ref <18)

## 2023-05-08 LAB — D-DIMER, QUANTITATIVE: D-Dimer, Quant: 3.88 ug/mL-FEU — ABNORMAL HIGH (ref 0.00–0.50)

## 2023-05-08 MED ORDER — IOHEXOL 350 MG/ML SOLN
75.0000 mL | Freq: Once | INTRAVENOUS | Status: AC | PRN
  Administered 2023-05-08: 75 mL via INTRAVENOUS

## 2023-05-08 NOTE — ED Notes (Signed)
Attempted PIV X 2 without success, but did obtain ordered labs. MD Leanna Sato aware and will attempt USGIV.

## 2023-05-08 NOTE — ED Triage Notes (Signed)
Pt is being treated for uti with augmentin x 7days. Nursing facility states pt has been altered since uti. Pt is now requiring oxygen x 2 days per facility.

## 2023-05-09 LAB — URINALYSIS, ROUTINE W REFLEX MICROSCOPIC
Bilirubin Urine: NEGATIVE
Glucose, UA: NEGATIVE mg/dL
Hgb urine dipstick: NEGATIVE
Ketones, ur: NEGATIVE mg/dL
Nitrite: NEGATIVE
Protein, ur: NEGATIVE mg/dL
Specific Gravity, Urine: 1.015 (ref 1.005–1.030)
pH: 7 (ref 5.0–8.0)

## 2023-05-09 NOTE — ED Provider Notes (Signed)
[place] EMERGENCY DEPARTMENT AT [facility] Provider Note   CSN: [id number] Arrival date & time: 05/08/23  1609     History  Chief Complaint  Patient presents with   Altered Mental Status    [name] is a 89 y.o. female.  Patient is a 89 yo female presenting from nursing facility for continued confusion since dx of UTI two weeks ago. Family at bedside admits to improving mental status. Pt states dysuria and increased frequency has improved. Pt also complains of sob and has been requiring oxygen at nursing facility for the past week. Family states oxygen drops at night. Pt denies fevers, chills, or coughing. Denies chest pain.   The history is provided by the patient. No language interpreter was used.  Altered Mental Status Associated symptoms: no abdominal pain, no fever, no palpitations, no rash, no seizures and no vomiting        Home Medications Prior to Admission medications   Medication Sig Start Date End Date Taking? Authorizing Provider  albuterol (VENTOLIN HFA) 108 (90 Base) MCG/ACT inhaler Inhale 2 puffs into the lungs every 6 (six) hours as needed for wheezing or shortness of breath.    [provider]  allopurinol (ZYLOPRIM) 100 MG tablet Take 100 mg by mouth daily.    [provider]  Amino Acids-Protein Hydrolys (PRO-STAT AWC) LIQD Take 30 mLs by mouth daily.    [provider]  ammonium lactate (AMLACTIN) 12 % cream Apply 1 Application topically in the morning and at bedtime.    [provider]  ascorbic acid (VITAMIN C) 500 MG tablet Take 500 mg by mouth 2 (two) times daily.    [provider]  atorvastatin (LIPITOR) 80 MG tablet Take 1 tablet (80 mg total) by mouth daily. 01/01/23 01/31/23  Dorcas Carrow, MD  Biotin 5000 MCG CAPS Take 5,000 mcg by mouth daily. (0900)    [provider]  carvedilol (COREG) 6.25 MG tablet Take 6.25 mg by mouth 2 (two) times daily with a meal. (0900 & 1700)     [provider]  Cholecalciferol (VITAMIN D) 50 MCG (2000 UT) CAPS Take 2,000 Units by mouth in the morning. (0900)    [provider]  cyclobenzaprine (FLEXERIL) 5 MG tablet Take 5 mg by mouth 3 (three) times daily as needed for muscle spasms.    [provider]  furosemide (LASIX) 80 MG tablet Take 80 mg by mouth in the morning. (0900) 09/19/17   [provider]  gabapentin (NEURONTIN) 400 MG capsule Take 1 capsule (400 mg total) by mouth 2 (two) times daily. 12/31/22 01/30/23  Dorcas Carrow, MD  lidocaine-prilocaine (EMLA) cream Apply 1 Application topically every 8 (eight) hours as needed (for pain). Apply to great toe every 8 hours for pain    [provider]  loratadine (CLARITIN) 10 MG tablet Take 10 mg by mouth in the morning. (0900)    [provider]  Magnesium 250 MG TABS Take 1 tablet by mouth daily.    [provider]  melatonin 3 MG TABS tablet Take 3 mg by mouth at bedtime. (2100)    [provider]  Menthol, Topical Analgesic, (BIOFREEZE COLORLESS EX) Apply 1 application  topically in the morning, at noon, and at bedtime. for neck pain    [provider]  metolazone (ZAROXOLYN) 2.5 MG tablet Take one tablet by mouth 4 days a week.  Take 30 minutes prior to furosemide Patient taking differently: Take 2.5 mg  by mouth See admin instructions. Take one tablet by mouth 4 days a week Monday, Tuesday, Wednesday, Thursday.  Take 30 minutes prior to furosemide 12/13/21   Corky Crafts, MD  Multiple Vitamins-Minerals (CERTA-VITE PO) Take 1 tablet by mouth daily. (0900)    [provider]  nitroGLYCERIN (NITROSTAT) 0.4 MG SL tablet Place 0.4 mg under the tongue every 5 (five) minutes as needed for chest pain (X 3 DOSES FOR CHEST PAIN).    [provider]  oxyCODONE (OXY IR/ROXICODONE) 5 MG immediate release tablet Take 1 tablet (5 mg total) by mouth every 4 (four) hours as needed for moderate  pain or severe pain (1 tablet for pain 4-6, 2 tablet for pain 7-10). 12/31/22   Dorcas Carrow, MD  polyethylene glycol powder (GLYCOLAX/MIRALAX) powder Take 17 g by mouth daily. (0900)    [provider]  potassium chloride (KLOR-CON) 10 MEQ tablet Take 30 mEq by mouth 2 (two) times daily. (0900 & 2100) 11/19/19   [provider]  sitaGLIPtin (JANUVIA) 50 MG tablet Take 50 mg by mouth in the morning. (0900)    [provider]      Allergies    Aspirin, Tramadol, Naproxen, and Shellfish-derived products    Review of Systems   Review of Systems  Constitutional:  Negative for chills and fever.  HENT:  Negative for ear pain and sore throat.   Eyes:  Negative for pain and visual disturbance.  Respiratory:  Positive for shortness of breath. Negative for cough.   Cardiovascular:  Negative for chest pain and palpitations.  Gastrointestinal:  Negative for abdominal pain and vomiting.  Genitourinary:  Negative for dysuria and hematuria.  Musculoskeletal:  Negative for arthralgias and back pain.  Skin:  Negative for color change and rash.  Neurological:  Negative for seizures and syncope.  All other systems reviewed and are negative.   Physical Exam Updated Vital Signs BP 114/74 (BP Location: Right Arm)   Pulse (!) 103   Temp 97.7 F (36.5 C) (Oral)   Resp 16   Ht 5' (1.524 m)   Wt 84.4 kg   SpO2 95%   BMI 36.34 kg/m  Physical Exam Vitals and nursing note reviewed.  Constitutional:      General: She is not in acute distress.    Appearance: She is well-developed.  HENT:     Head: Normocephalic and atraumatic.  Eyes:     Conjunctiva/sclera: Conjunctivae normal.  Cardiovascular:     Rate and Rhythm: Normal rate and regular rhythm.     Heart sounds: No murmur heard. Pulmonary:     Effort: Pulmonary effort is normal. No respiratory distress.     Breath sounds: Normal breath sounds.  Abdominal:     Palpations: Abdomen is soft.     Tenderness: There is no  abdominal tenderness.  Musculoskeletal:        General: No swelling.     Cervical back: Neck supple.  Skin:    General: Skin is warm and dry.     Capillary Refill: Capillary refill takes less than 2 seconds.  Neurological:     Mental Status: She is alert and oriented to person, place, and time.     GCS: GCS eye subscore is 4. GCS verbal subscore is 5. GCS motor subscore is 6.     Cranial Nerves: Cranial nerves 2-12 are intact.     Sensory: Sensation is intact.     Motor: Motor function is intact.     Coordination:  Coordination is intact.  Psychiatric:        Mood and Affect: Mood normal.     ED Results / Procedures / Treatments   Labs (all labs ordered are listed, but only abnormal results are displayed) Labs Reviewed  CBC WITH DIFFERENTIAL/PLATELET - Abnormal; Notable for the following components:      Result Value   RDW 17.4 (*)    Eosinophils Absolute 0.6 (*)    All other components within normal limits  D-DIMER, QUANTITATIVE - Abnormal; Notable for the following components:   D-Dimer, Quant 3.88 (*)    All other components within normal limits  COMPREHENSIVE METABOLIC PANEL - Abnormal; Notable for the following components:   Sodium 128 (*)    Potassium 2.8 (*)    Chloride 90 (*)    Glucose, Bld 124 (*)    BUN 39 (*)    Creatinine, Ser 1.08 (*)    Albumin 3.0 (*)    GFR, Estimated 49 (*)    All other components within normal limits  URINALYSIS, ROUTINE W REFLEX MICROSCOPIC - Abnormal; Notable for the following components:   Color, Urine STRAW (*)    Leukocytes,Ua MODERATE (*)    Bacteria, UA RARE (*)    All other components within normal limits  TROPONIN I (HIGH SENSITIVITY)  TROPONIN I (HIGH SENSITIVITY)    EKG None  Radiology CT Angio Chest PE W and/or Wo Contrast  Result Date: 05/08/2023 CLINICAL DATA:  Shortness of breath and positive D-dimer EXAM: CT ANGIOGRAPHY CHEST WITH CONTRAST TECHNIQUE: Multidetector CT imaging of the chest was performed using the  standard protocol during bolus administration of intravenous contrast. Multiplanar CT image reconstructions and MIPs were obtained to evaluate the vascular anatomy. RADIATION DOSE REDUCTION: This exam was performed according to the departmental dose-optimization program which includes automated exposure control, adjustment of the mA and/or kV according to patient size and/or use of iterative reconstruction technique. CONTRAST:  75mL OMNIPAQUE IOHEXOL 350 MG/ML SOLN COMPARISON:  Chest x-ray from earlier in the same day. FINDINGS: Cardiovascular: Atherosclerotic calcifications of the aorta are noted. No aneurysmal dilatation or dissection is seen. Coronary calcifications are noted. Heart is at the upper limits of normal in size. The pulmonary artery shows a normal branching pattern bilaterally. No filling defect to suggest pulmonary embolism is noted. Pacing device is again noted. Mediastinum/Nodes: Thoracic inlet is within normal limits. No hilar or mediastinal adenopathy is noted. The esophagus as visualized is within normal limits. Lungs/Pleura: Lungs are well aerated bilaterally. Motion artifact is noted although no focal infiltrate or effusion is seen. No parenchymal nodules are noted. Upper Abdomen: Visualized upper abdomen shows findings of prior cholecystectomy. Musculoskeletal: Degenerative changes of the thoracic spine are noted. No acute rib abnormality is seen. No compression deformity is noted. Review of the MIP images confirms the above findings. IMPRESSION: No evidence of pulmonary emboli. No acute abnormality seen. Aortic Atherosclerosis (ICD10-I70.0). Electronically Signed   By: Alcide Clever M.D.   On: 05/08/2023 22:31   DG Chest Portable 1 View  Result Date: 05/08/2023 CLINICAL DATA:  Altered mental status.  On treatment for a UTI. EXAM: PORTABLE CHEST 1 VIEW COMPARISON:  X-ray 11/30/2020 FINDINGS: Underinflation. No consolidation, pneumothorax or effusion. No edema. Stable cardiopericardial  silhouette. Calcified aorta. Left chest battery pack with pacemaker leads along the right side of the heart. Degenerative changes seen in both shoulders. Degenerative changes along the spine. Osteopenia. Surgical clips in the right upper quadrant of the abdomen. Slight thickening of the minor fissure.  IMPRESSION: Pacemaker.  Underinflation.  Elevated right hemidiaphragm. Electronically Signed   By: Karen Kays M.D.   On: 05/08/2023 17:40    Procedures Procedures    Medications Ordered in ED Medications  iohexol (OMNIPAQUE) 350 MG/ML injection 75 mL (75 mLs Intravenous Contrast Given 05/08/23 2203)    ED Course/ Medical Decision Making/ A&P                             Medical Decision Making Amount and/or Complexity of Data Reviewed Labs: ordered. Radiology: ordered.  Risk Prescription drug management.   89 yo female presenting for sob and possible AMS. Facility requesting urine recheck for recent UTI. Pt is alert and Oriented x 3 on my exam. Following commands. Moving all four extremities. Pt family states her confusion has significantly improved since antibiotic initiation for UTI. Due to my well exam and families approval we will not chase AMS at this time.   Urine symptoms resolved-as per patient. UA ordered, as requested by facility, and demonstrates no UTI.  Pt does admit to sob. No hypoxia throughout her long ED visit today. Stable vitals. Stable ECG, troponins, electrolytes, CXR. No MI. No hypoxia. No sepsis. No pneumothorax. No pneumonia. No pleural effusions. CT PE demonstrates no PE.   Patient is morbidly obese, consider this and chairbound state secondary to above the knee amputee.. resulting in decreased conditioning and chronic sob. Recommended for f/u with pulmonologist if symptoms do not improve. Local physician given.   All labs evaluated and interpreted by myself. ECG evaluated and interpreted by myself.  Patient in no distress and overall condition improved here  in the ED. Detailed discussions were had with the patient regarding current findings, and need for close f/u with PCP or on call doctor. The patient has been instructed to return immediately if the symptoms worsen in any way for re-evaluation. Patient verbalized understanding and is in agreement with current care plan. All questions answered prior to discharge.         Final Clinical Impression(s) / ED Diagnoses Final diagnoses:  SOB (shortness of breath)    Rx / DC Orders ED Discharge Orders     None         Franne Forts, DO 05/09/23 1347

## 2023-05-09 NOTE — ED Notes (Signed)
Report called and accepted by receiving nurse at receiving facility. Transport has been ordered.

## 2023-05-09 NOTE — Discharge Instructions (Addendum)
UA demonstrates no UTI  You also presented for shortness of breath. You had no hypoxia. No pneumonia. No pulmonary embolism (blood clot). No pneumothorax (collapsed lung). Cardiac work up was good. Please call and schedule an appointment with a pulmonologist if shortness of breath continues. Local physician listed above.

## 2023-05-09 NOTE — ED Notes (Signed)
Notified [place] [facility] of patient needing transportation back to [facility]. 

## 2023-05-09 NOTE — ED Notes (Signed)
Pt has left facility with [facility]. Pt clean, dry, alert and oriented to baseline.

## 2023-05-29 DIAGNOSIS — Z7984 Long term (current) use of oral hypoglycemic drugs: Secondary | ICD-10-CM | POA: Insufficient documentation

## 2023-05-29 DIAGNOSIS — E876 Hypokalemia: Secondary | ICD-10-CM | POA: Diagnosis not present

## 2023-05-29 DIAGNOSIS — N179 Acute kidney failure, unspecified: Secondary | ICD-10-CM | POA: Insufficient documentation

## 2023-05-29 DIAGNOSIS — R4182 Altered mental status, unspecified: Secondary | ICD-10-CM | POA: Insufficient documentation

## 2023-05-29 DIAGNOSIS — E119 Type 2 diabetes mellitus without complications: Secondary | ICD-10-CM | POA: Diagnosis not present

## 2023-05-29 DIAGNOSIS — N3000 Acute cystitis without hematuria: Secondary | ICD-10-CM | POA: Insufficient documentation

## 2023-05-29 DIAGNOSIS — E860 Dehydration: Secondary | ICD-10-CM | POA: Insufficient documentation

## 2023-05-29 DIAGNOSIS — R531 Weakness: Secondary | ICD-10-CM | POA: Diagnosis present

## 2023-05-29 DIAGNOSIS — I5032 Chronic diastolic (congestive) heart failure: Secondary | ICD-10-CM | POA: Insufficient documentation

## 2023-05-29 DIAGNOSIS — Z950 Presence of cardiac pacemaker: Secondary | ICD-10-CM | POA: Insufficient documentation

## 2023-05-29 DIAGNOSIS — I110 Hypertensive heart disease with heart failure: Secondary | ICD-10-CM | POA: Insufficient documentation

## 2023-05-29 DIAGNOSIS — Z79899 Other long term (current) drug therapy: Secondary | ICD-10-CM | POA: Insufficient documentation

## 2023-05-29 DIAGNOSIS — D649 Anemia, unspecified: Secondary | ICD-10-CM | POA: Diagnosis not present

## 2023-05-30 ENCOUNTER — Emergency Department (HOSPITAL_COMMUNITY): Payer: Medicare Other

## 2023-05-30 ENCOUNTER — Emergency Department (HOSPITAL_COMMUNITY)
Admission: EM | Admit: 2023-05-30 | Discharge: 2023-05-30 | Disposition: A | Discharge status: Skilled Nursing Facility | Payer: Medicare Other | Attending: Emergency Medicine | Admitting: Emergency Medicine

## 2023-05-30 ENCOUNTER — Other Ambulatory Visit: Payer: Self-pay

## 2023-05-30 ENCOUNTER — Encounter (HOSPITAL_COMMUNITY): Payer: Self-pay | Admitting: Emergency Medicine

## 2023-05-30 DIAGNOSIS — N3000 Acute cystitis without hematuria: Secondary | ICD-10-CM

## 2023-05-30 DIAGNOSIS — E860 Dehydration: Secondary | ICD-10-CM

## 2023-05-30 LAB — URINALYSIS, ROUTINE W REFLEX MICROSCOPIC
Bilirubin Urine: NEGATIVE
Glucose, UA: NEGATIVE mg/dL
Hgb urine dipstick: NEGATIVE
Ketones, ur: NEGATIVE mg/dL
Nitrite: NEGATIVE
Protein, ur: 30 mg/dL — AB
Specific Gravity, Urine: 1.006 (ref 1.005–1.030)
WBC, UA: >50 WBC/hpf (ref 0–5)
pH: 6 (ref 5.0–8.0)

## 2023-05-30 LAB — CBC
HCT: 32 % — ABNORMAL LOW (ref 36.0–46.0)
Hemoglobin: 10.5 g/dL — ABNORMAL LOW (ref 12.0–15.0)
MCH: 26.6 pg (ref 26.0–34.0)
MCHC: 32.8 g/dL (ref 30.0–36.0)
MCV: 81 fL (ref 80.0–100.0)
Platelets: 321 10*3/uL (ref 150–400)
RBC: 3.95 MIL/uL (ref 3.87–5.11)
RDW: 19.1 % — ABNORMAL HIGH (ref 11.5–15.5)
WBC: 8.4 10*3/uL (ref 4.0–10.5)
nRBC: 0 % (ref 0.0–0.2)

## 2023-05-30 LAB — BASIC METABOLIC PANEL
Anion gap: 8 (ref 5–15)
BUN: 50 mg/dL — ABNORMAL HIGH (ref 8–23)
CO2: 33 mmol/L — ABNORMAL HIGH (ref 22–32)
Calcium: 9.1 mg/dL (ref 8.9–10.3)
Chloride: 91 mmol/L — ABNORMAL LOW (ref 98–111)
Creatinine, Ser: 1.57 mg/dL — ABNORMAL HIGH (ref 0.44–1.00)
GFR, Estimated: 31 mL/min — ABNORMAL LOW (ref >60)
Glucose, Bld: 105 mg/dL — ABNORMAL HIGH (ref 70–99)
Potassium: 2.9 mmol/L — ABNORMAL LOW (ref 3.5–5.1)
Sodium: 132 mmol/L — ABNORMAL LOW (ref 135–145)

## 2023-05-30 LAB — LACTIC ACID, PLASMA: Lactic Acid, Venous: 1.1 mmol/L (ref 0.5–1.9)

## 2023-05-30 LAB — CBG MONITORING, ED: Glucose-Capillary: 93 mg/dL (ref 70–99)

## 2023-05-30 MED ORDER — FOSFOMYCIN TROMETHAMINE 3 G PO PACK
3.0000 g | PACK | Freq: Once | ORAL | Status: AC
  Administered 2023-05-30: 3 g via ORAL
  Filled 2023-05-30: qty 3

## 2023-05-30 MED ORDER — SODIUM CHLORIDE 0.9 % IV BOLUS
1000.0000 mL | Freq: Once | INTRAVENOUS | Status: AC
  Administered 2023-05-30: 1000 mL via INTRAVENOUS

## 2023-05-30 MED ORDER — POTASSIUM CHLORIDE CRYS ER 20 MEQ PO TBCR
40.0000 meq | EXTENDED_RELEASE_TABLET | Freq: Once | ORAL | Status: AC
  Administered 2023-05-30: 40 meq via ORAL
  Filled 2023-05-30: qty 2

## 2023-05-30 NOTE — ED Triage Notes (Signed)
Pt in from [facility] in [place] via [facility], staff states pt has been hypotensive and slow to respond today. Pt arrives alert, does report some generalized weakness and some low back pain. Recently finished Augmentin for UTI

## 2023-05-30 NOTE — ED Notes (Signed)
Pacemaker interrogated. Pending report.

## 2023-05-30 NOTE — ED Provider Notes (Signed)
[place] EMERGENCY DEPARTMENT AT [facility] Provider Note   CSN: [id number] Arrival date & time: 05/29/23  2330     History  Chief Complaint  Patient presents with   Weakness    [name] is a 89 y.o. female.  The history is provided by the patient.  Patient with extensive history including obesity, heart block with pacemaker, diabetes, hypertension presents with altered mental status.  Patient lives in a local nursing home.  It is reported the patient appeared to be slow to respond and hypotensive. Patient denies any complaints.  She denies any headache, no chest pain, no shortness of breath.  No abdominal pain or vomiting.  I spoke to staff at the nursing home.  Patient was last given Flexeril and oxycodone around 4 PM on July 8. It is  reported when she was having altered mental status, her pulse was in the 20s but that improved spontaneously    Past Medical History:  Diagnosis Date   Arthritis    "qwhere"   AV block     2:1  January 14, 2011 / pacemaker plan when cellulitis resolved in length   Bradycardia    2:1  AV block   Cellulitis    Superficial cellulitis right lower leg   January 14, 2011   Chronic diastolic CHF (congestive heart failure) (HCC)    Diabetes mellitus without complication (HCC)    Edema    . EF 65-70%, echo, November 29, 2010   Ejection fraction    EF 65%, echo, January, 2012   Hypertension    Hypokalemia    October, 2012   Murmur    No significant valvular disease by echo, January, 2012   Pacemaker    January, 2013 [employer] Sensa   Pulmonary hypertension (HCC)     46 mmHg... echo... January, 2012    Home Medications Prior to Admission medications   Medication Sig Start Date End Date Taking? Authorizing Provider  albuterol (VENTOLIN HFA) 108 (90 Base) MCG/ACT inhaler Inhale 2 puffs into the lungs every 6 (six) hours as needed for wheezing or shortness of breath.    [provider]  allopurinol (ZYLOPRIM) 100  MG tablet Take 100 mg by mouth daily.    [provider]  Amino Acids-Protein Hydrolys (PRO-STAT AWC) LIQD Take 30 mLs by mouth daily.    [provider]  ammonium lactate (AMLACTIN) 12 % cream Apply 1 Application topically in the morning and at bedtime.    [provider]  ascorbic acid (VITAMIN C) 500 MG tablet Take 500 mg by mouth 2 (two) times daily.    [provider]  atorvastatin (LIPITOR) 80 MG tablet Take 1 tablet (80 mg total) by mouth daily. 01/01/23 01/31/23  Dorcas Carrow, MD  Biotin 5000 MCG CAPS Take 5,000 mcg by mouth daily. (0900)    [provider]  carvedilol (COREG) 6.25 MG tablet Take 6.25 mg by mouth 2 (two) times daily with a meal. (0900 & 1700)    [provider]  Cholecalciferol (VITAMIN D) 50 MCG (2000 UT) CAPS Take 2,000 Units by mouth in the morning. (0900)    [provider]  cyclobenzaprine (FLEXERIL) 5 MG tablet Take 5 mg by mouth 3 (three) times daily as needed for muscle spasms.    [provider]  furosemide (LASIX) 80 MG tablet Take 80 mg by mouth in the morning. (0900) 09/19/17   [provider]  gabapentin (NEURONTIN) 400 MG capsule Take 1 capsule (  400 mg total) by mouth 2 (two) times daily. 12/31/22 01/30/23  Dorcas Carrow, MD  lidocaine-prilocaine (EMLA) cream Apply 1 Application topically every 8 (eight) hours as needed (for pain). Apply to great toe every 8 hours for pain    [provider]  loratadine (CLARITIN) 10 MG tablet Take 10 mg by mouth in the morning. (0900)    [provider]  Magnesium 250 MG TABS Take 1 tablet by mouth daily.    [provider]  melatonin 3 MG TABS tablet Take 3 mg by mouth at bedtime. (2100)    [provider]  Menthol, Topical Analgesic, (BIOFREEZE COLORLESS EX) Apply 1 application  topically in the morning, at noon, and at bedtime. for neck pain    [provider]  metolazone (ZAROXOLYN) 2.5 MG tablet Take  one tablet by mouth 4 days a week.  Take 30 minutes prior to furosemide Patient taking differently: Take 2.5 mg by mouth See admin instructions. Take one tablet by mouth 4 days a week Monday, Tuesday, Wednesday, Thursday.  Take 30 minutes prior to furosemide 12/13/21   Corky Crafts, MD  Multiple Vitamins-Minerals (CERTA-VITE PO) Take 1 tablet by mouth daily. (0900)    [provider]  nitroGLYCERIN (NITROSTAT) 0.4 MG SL tablet Place 0.4 mg under the tongue every 5 (five) minutes as needed for chest pain (X 3 DOSES FOR CHEST PAIN).    [provider]  oxyCODONE (OXY IR/ROXICODONE) 5 MG immediate release tablet Take 1 tablet (5 mg total) by mouth every 4 (four) hours as needed for moderate pain or severe pain (1 tablet for pain 4-6, 2 tablet for pain 7-10). 12/31/22   Dorcas Carrow, MD  polyethylene glycol powder (GLYCOLAX/MIRALAX) powder Take 17 g by mouth daily. (0900)    [provider]  potassium chloride (KLOR-CON) 10 MEQ tablet Take 30 mEq by mouth 2 (two) times daily. (0900 & 2100) 11/19/19   [provider]  sitaGLIPtin (JANUVIA) 50 MG tablet Take 50 mg by mouth in the morning. (0900)    [provider]      Allergies    Aspirin, Tramadol, Naproxen, and Shellfish-derived products    Review of Systems   Review of Systems  Constitutional:  Negative for fever.  Gastrointestinal:  Negative for vomiting.  Neurological:  Negative for headaches.    Physical Exam Updated Vital Signs BP (!) 118/39   Pulse 69   Temp 98 F (36.7 C) (Oral)   Resp 14   Wt 84.4 kg   SpO2 100%   BMI 36.34 kg/m  Physical Exam CONSTITUTIONAL: Elderly, no acute distress HEAD: Normocephalic/atraumatic EYES: EOMI/PERRL ENMT: Mucous membranes moist NECK: supple no meningeal signs SPINE/BACK:entire spine nontender CV: S1/S2 noted LUNGS: Lungs are clear to auscultation bilaterally, no apparent distress ABDOMEN: soft, nontender, obese GU: Wearing a  diaper NEURO: Pt is slow to respond but answers all questions appropriately.  No gross motor deficit is noted, the patient is an amputee EXTREMITIES: Right leg without deformities, no wounds are noted to the foot.  Left AKA noted SKIN: warm, color normal   ED Results / Procedures / Treatments   Labs (all labs ordered are listed, but only abnormal results are displayed) Labs Reviewed  BASIC METABOLIC PANEL - Abnormal; Notable for the following components:      Result Value   Sodium 132 (*)    Potassium 2.9 (*)    Chloride 91 (*)    CO2 33 (*)    Glucose, Bld  105 (*)    BUN 50 (*)    Creatinine, Ser 1.57 (*)    GFR, Estimated 31 (*)    All other components within normal limits  CBC - Abnormal; Notable for the following components:   Hemoglobin 10.5 (*)    HCT 32.0 (*)    RDW 19.1 (*)    All other components within normal limits  URINALYSIS, ROUTINE W REFLEX MICROSCOPIC - Abnormal; Notable for the following components:   Color, Urine AMBER (*)    APPearance TURBID (*)    Protein, ur 30 (*)    Leukocytes,Ua LARGE (*)    Bacteria, UA RARE (*)    Non Squamous Epithelial 0-5 (*)    All other components within normal limits  URINE CULTURE  LACTIC ACID, PLASMA  CBG MONITORING, ED    EKG EKG Interpretation Date/Time:  Tuesday May 30 2023 00:51:51 EDT Ventricular Rate:  77 PR Interval:  55 QRS Duration:  158 QT Interval:  440 QTC Calculation: 498 R Axis:   255  Text Interpretation: Sinus or ectopic atrial rhythm Short PR interval Interpretation limited secondary to artifact No significant change since last tracing Confirmed by Zadie Rhine ([id number]) on 05/30/2023 1:08:15 AM  Radiology DG Chest Portable 1 View  Result Date: 05/30/2023 CLINICAL DATA:  Hypotension and weakness. EXAM: PORTABLE CHEST 1 VIEW COMPARISON:  May 08, 2023 FINDINGS: There is stable dual lead AICD positioning. The heart size and mediastinal contours are within normal limits. Low lung volumes are noted.  Mild, chronic appearing increased interstitial lung markings are noted. There is no evidence of an acute infiltrate, pleural effusion or pneumothorax. Multilevel degenerative changes seen throughout the thoracic spine. IMPRESSION: Low lung volumes without acute or active cardiopulmonary disease. Electronically Signed   By: Aram Candela M.D.   On: 05/30/2023 02:05   CT Head Wo Contrast  Result Date: 05/30/2023 CLINICAL DATA:  Altered mental status. EXAM: CT HEAD WITHOUT CONTRAST TECHNIQUE: Contiguous axial images were obtained from the base of the skull through the vertex without intravenous contrast. RADIATION DOSE REDUCTION: This exam was performed according to the departmental dose-optimization program which includes automated exposure control, adjustment of the mA and/or kV according to patient size and/or use of iterative reconstruction technique. COMPARISON:  December 01, 2020 FINDINGS: Brain: There is mild cerebral atrophy with widening of the extra-axial spaces and ventricular dilatation. There are areas of decreased attenuation within the white matter tracts of the supratentorial brain, consistent with microvascular disease changes. Vascular: No hyperdense vessel or unexpected calcification. Skull: Normal. Negative for fracture or focal lesion. Sinuses/Orbits: No acute finding. Other: None. IMPRESSION: 1. Generalized cerebral atrophy with chronic white matter small vessel ischemic changes. 2. No acute intracranial abnormality. Electronically Signed   By: Aram Candela M.D.   On: 05/30/2023 01:59    Procedures Procedures    Medications Ordered in ED Medications  potassium chloride SA (KLOR-CON M) CR tablet 40 mEq (has no administration in time range)  sodium chloride 0.9 % bolus 1,000 mL (1,000 mLs Intravenous New Bag/Given 05/30/23 0349)  fosfomycin (MONUROL) packet 3 g (3 g Oral Given 05/30/23 0354)    ED Course/ Medical Decision Making/ A&P Clinical Course as of 05/30/23 0406  Tue May 30, 2023  0107 Hemoglobin(!): 10.5 Mild anemia [DW]  0131 Creatinine(!): 1.57 Acute kidney injury [DW]  0137 Patient presents for evaluation of generalized weakness and altered mental status.  Though she is somewhat groggy, she answers all questions appropriately no focal weakness.  Labs  reveal acute kidney injury.  It is reported she was bradycardic at the facility, will interrogate pacemaker [DW]  [phone] Patient stable.  Discussed with [employer] tech.  There has been no issues with patient's ICD/pacemaker.  Pacemaker function appears to be appropriate. [DW]  0405 Patient stable in the ER, no bradycardic episodes here She is taking fluids without difficulty.  She has been given IV fluids.  I suspect she has been having overdiuresis.  Will advised to hold all of her diuretics for the next 48 hours.  She was given fluid and potassium supplementation here. She was given a one-time dose of fosfomycin for UTI, but she is not septic appearing at this time. Patient appears to be at baseline.  Will be discharged back to facility. [DW]    Clinical Course User Index [DW] Zadie Rhine, MD                             Medical Decision Making Amount and/or Complexity of Data Reviewed Labs: ordered. Decision-making details documented in ED Course. Radiology: ordered.  Risk Prescription drug management.   This patient presents to the ED for concern of altered mental status and weakness, this involves an extensive number of treatment options, and is a complaint that carries with it a high risk of complications and morbidity.  The differential diagnosis includes but is not limited to CVA, intracranial hemorrhage, acute coronary syndrome, renal failure, urinary tract infection, electrolyte disturbance, pneumonia    Comorbidities that complicate the patient evaluation: Patient's presentation is complicated by their history of heart block with pacemaker  Social Determinants of Health: Patient's   residence in a nursing home   increases the complexity of managing their presentation  Additional history obtained: Additional history obtained from nursing home/care facility Records reviewed  cardiology notes  Lab Tests: I Ordered, and personally interpreted labs.  The pertinent results include: Acute kidney injury, hypokalemia  Imaging Studies ordered: I ordered imaging studies including CT scan head and X-ray chest   I independently visualized and interpreted imaging which showed no acute findings I agree with the radiologist interpretation  Cardiac Monitoring: The patient was maintained on a cardiac monitor.  I personally viewed and interpreted the cardiac monitor which showed an underlying rhythm of:   Paced rhythm  Medicines ordered and prescription drug management: I ordered medication including fluids for dehydration Reevaluation of the patient after these medicines showed that the patient    stayed the same  Reevaluation: After the interventions noted above, I reevaluated the patient and found that they have :improved  Complexity of problems addressed: Patient's presentation is most consistent with  acute presentation with potential threat to life or bodily function  Disposition: After consideration of the diagnostic results and the patient's response to treatment,  I feel that the patent would benefit from discharge   .           Final Clinical Impression(s) / ED Diagnoses Final diagnoses:  Dehydration  Acute cystitis without hematuria    Rx / DC Orders ED Discharge Orders     None         Zadie Rhine, MD 05/30/23 [phone]

## 2023-05-30 NOTE — Progress Notes (Signed)
Referring Provider: Beryle Flock, MD  Primary Care Physician:  Lindaann Pascal Primary Gastroenterologist:  Dr. Marletta Lor  Chief Complaint  Patient presents with   Diarrhea    Pt is have having loose stools. Says when she takes medications come out and are still a whole pill. Pt is also having blood in stools    HPI:   [name] is a 89 y.o. female with history of heart block with pacemaker in place, CHF, diabetes, HTN, recent left above-the-knee amputation February 2024 for ischemia, presenting today at the request of Beryle Flock, MD for loose stools and GI bleed.  Today:  Patient states that she is not having any problems at this time.  She has not seen her stool so is unable to say if she is having blood in her stools or black stools.  She denies any trouble with diarrhea currently.  States a few weeks ago, she did have some diarrhea, but this has resolved.  She does take MiraLAX intermittently to keep her bowels moving.  Usually she is having a bowel movement daily.  Denies abdominal pain, nausea, vomiting, reflux symptoms, or heartburn.  She has not noticed any weight loss.  States she eats what she likes.  She has never had an EGD or colonoscopy.  She is bedbound, total care, requires Hoyer lift to get into a wheelchair.  She is alert and oriented x 4 and is able to provide reliable history.  Her daughter is also present with her today.   I spoke with [name], nurse at [facility].  [name] states that between 6/19 - 6/22, patient was having loose stools.  She was also feeling weak/sick overall.  She is having multiple bowel movements per day.  They checked her stool for blood and it was occult blood positive though no bright red blood or black stool was ever seen.  With loose stools, she was also passing some undissolved pills.  Overall, her bowel movements have improved.  Now with some occasional loose stools, but not usually.  She was started on Protonix and Carafate due to  stool being occult blood positive.   Per chart review, baseline hemoglobin prior to surgery in February was in the 12-13 range.  Hemoglobin declined down to 8.9 in February after surgery.  Improved back to 12.7 in June, but hemoglobin on 05/30/2023 was back down to 10.5 with normocytic indices.    Past Medical History:  Diagnosis Date   Arthritis    "qwhere"   AV block     2:1  January 14, 2011 / pacemaker plan when cellulitis resolved in length   Bradycardia    2:1  AV block   Cellulitis    Superficial cellulitis right lower leg   January 14, 2011   Chronic diastolic CHF (congestive heart failure) (HCC)    Diabetes mellitus without complication (HCC)    Edema    . EF 65-70%, echo, November 29, 2010   Ejection fraction    EF 65%, echo, January, 2012   Hypertension    Hypokalemia    October, 2012   Murmur    No significant valvular disease by echo, January, 2012   Pacemaker    January, 2013 Medtronic Sensa   Pulmonary hypertension (HCC)     46 mmHg... echo... January, 2012    Past Surgical History:  Procedure Laterality Date   ABDOMINAL AORTOGRAM W/LOWER EXTREMITY Left 12/23/2022   Procedure: ABDOMINAL AORTOGRAM W/LOWER EXTREMITY;  Surgeon: Leonie Douglas, MD;  Location: [facility] INVASIVE CV LAB;  Service: Cardiovascular;  Laterality: Left;   AMPUTATION Left 12/26/2022   Procedure: AMPUTATION ABOVE KNEE;  Surgeon: Leonie Douglas, MD;  Location: [facility] OR;  Service: Vascular;  Laterality: Left;   APPLICATION OF WOUND VAC Left 12/26/2022   Procedure: APPLICATION OF WOUND VAC;  Surgeon: Leonie Douglas, MD;  Location: [facility] OR;  Service: Vascular;  Laterality: Left;   CHOLECYSTECTOMY     HIP ARTHROPLASTY Right    INSERT / REPLACE / REMOVE PACEMAKER  11/2011   PARTIAL HYSTERECTOMY     vaginal   PERMANENT PACEMAKER INSERTION N/A 12/09/2011   Procedure: PERMANENT PACEMAKER INSERTION;  Surgeon: Marinus Maw, MD;  Location: [facility] CATH LAB;  Service: Cardiovascular;  Laterality: N/A;   PPM  GENERATOR CHANGEOUT N/A 01/03/2022   Procedure: PPM GENERATOR CHANGEOUT;  Surgeon: Marinus Maw, MD;  Location: [facility] INVASIVE CV LAB;  Service: Cardiovascular;  Laterality: N/A;    Current Outpatient Medications  Medication Sig Dispense Refill   albuterol (VENTOLIN HFA) 108 (90 Base) MCG/ACT inhaler Inhale 2 puffs into the lungs every 6 (six) hours as needed for wheezing or shortness of breath.     Amino Acids-Protein Hydrolys (PRO-STAT AWC) LIQD Take 30 mLs by mouth daily.     ammonium lactate (AMLACTIN) 12 % cream Apply 1 Application topically in the morning and at bedtime.     ascorbic acid (VITAMIN C) 500 MG tablet Take 500 mg by mouth 2 (two) times daily.     Biotin 5000 MCG CAPS Take 5,000 mcg by mouth daily. (0900)     carvedilol (COREG) 6.25 MG tablet Take 6.25 mg by mouth 2 (two) times daily with a meal. (0900 & 1700)     cetirizine (ZYRTEC) 10 MG tablet Take 10 mg by mouth daily.     Cholecalciferol (VITAMIN D) 50 MCG (2000 UT) CAPS Take 2,000 Units by mouth in the morning. (0900)     cyclobenzaprine (FLEXERIL) 5 MG tablet Take 5 mg by mouth 3 (three) times daily as needed for muscle spasms.     furosemide (LASIX) 80 MG tablet Take 80 mg by mouth in the morning. (0900)     gabapentin (NEURONTIN) 400 MG capsule Take 1 capsule (400 mg total) by mouth 2 (two) times daily. 60 capsule 0   guaiFENesin-dextromethorphan (ROBITUSSIN DM) 100-10 MG/5ML syrup Take 5 mLs by mouth every 4 (four) hours as needed for cough.     Magnesium 250 MG TABS Take 1 tablet by mouth daily.     melatonin 3 MG TABS tablet Take 3 mg by mouth at bedtime. (2100)     Menthol, Topical Analgesic, (BIOFREEZE COLORLESS EX) Apply 1 application  topically in the morning, at noon, and at bedtime. for neck pain     metolazone (ZAROXOLYN) 2.5 MG tablet Take 2.5 mg by mouth every other day.     Multiple Vitamins-Minerals (CERTA PLUS) TABS Take by mouth.     nitroGLYCERIN (NITROSTAT) 0.4 MG SL tablet Place 0.4 mg under the  tongue every 5 (five) minutes as needed for chest pain (X 3 DOSES FOR CHEST PAIN).     oxyCODONE (OXY IR/ROXICODONE) 5 MG immediate release tablet Take 1 tablet (5 mg total) by mouth every 4 (four) hours as needed for moderate pain or severe pain (1 tablet for pain 4-6, 2 tablet for pain 7-10). 20 tablet 0   pantoprazole (PROTONIX) 40 MG tablet Take 40 mg by mouth daily.     polyethylene glycol powder (  GLYCOLAX/MIRALAX) powder Take 17 g by mouth daily. (0900)     potassium chloride (KLOR-CON) 10 MEQ tablet Take 30 mEq by mouth 2 (two) times daily. (0900 & 2100)     rosuvastatin (CRESTOR) 40 MG tablet Take 40 mg by mouth daily.     sitaGLIPtin (JANUVIA) 50 MG tablet Take 50 mg by mouth in the morning. (0900)     sucralfate (CARAFATE) 1 g tablet Take 1 g by mouth 4 (four) times daily -  with meals and at bedtime.     No current facility-administered medications for this visit.    Allergies as of 06/01/2023 - Review Complete 06/01/2023  Allergen Reaction Noted   Aspirin Rash and Other (See Comments)    Tramadol Other (See Comments) 09/03/2014   Naproxen  11/24/2016   Shellfish-derived products Itching and Rash 12/10/2021    Family History  Problem Relation Age of Onset   Cardiomyopathy Father    Diabetes Father    Heart failure Father    Dementia Mother    Cancer Son    Coronary artery disease Son    Heart attack Neg Hx     Social History   Socioeconomic History   Marital status: Widowed    Spouse name: Not on file   Number of children: Not on file   Years of education: Not on file   Highest education level: Not on file  Occupational History   Occupation: Retired    Associate Professor: RETIRED  Tobacco Use   Smoking status: Never   Smokeless tobacco: Never  Vaping Use   Vaping status: Never Used  Substance and Sexual Activity   Alcohol use: No   Drug use: No   Sexual activity: Never  Other Topics Concern   Not on file  Social History Narrative   Retired    Widowed     Tobacco Use - No.    Alcohol Use - no   Regular Exercise - no   Drug Use - no   Social Determinants of Corporate investment banker Strain: Not on file  Food Insecurity: Not on file  Transportation Needs: Not on file  Physical Activity: Not on file  Stress: Not on file  Social Connections: Not on file  Intimate Partner Violence: Not on file    Review of Systems: Gen: Denies any fever, chills, cold or flulike symptoms, presyncope, syncope. CV: Denies chest pain, heart palpitations. Resp: Denies shortness of breath at rest though she is chronically on 2 L supplemental O2 via nasal cannula.  Denies cough. GI: See HPI GU : Denies urinary burning, urinary frequency, urinary hesitancy MS: Denies joint pain. Derm: Denies rash. Psych: Denies depression, anxiety. Heme: See HPI  Physical Exam: BP (!) 140/81   Pulse 81   Temp (!) 97.2 F (36.2 C) (Temporal)   Ht 4\' 7"  (1.397 m)   Wt 186 lb (84.4 kg)   BMI 43.23 kg/m  General:   Alert and oriented. Pleasant and cooperative. Well-nourished and well-developed. In a wheel chair with hoyer pad under neath her. Unable to transfer to table.  Head:  Normocephalic and atraumatic. Eyes:  Without icterus, sclera clear and conjunctiva pink.  Ears:  Normal auditory acuity. Lungs:  Clear to auscultation bilaterally. No wheezes, rales, or rhonchi. No distress.  Nasal cannula in place, on 2 L O2. Heart:  S1, S2 present without murmurs appreciated.  Abdomen:  +BS, soft, non-tender and non-distended. No HSM noted. No guarding or rebound. No masses appreciated.  Rectal:  Deferred  Msk: Left above-the-knee amputation Extremities:  Without edema. Neurologic:  Alert and  oriented x4;  grossly normal neurologically. Skin:  Intact without significant lesions or rashes. Psych:  Normal mood and affect.    Assessment:  89 year old female who resides at [facility] with history of heart block with pacemaker in place, CHF, diabetes, HTN, recent left  above-the-knee amputation February 2024 for ischemia, presenting today at the request of Beryle Flock, MD for loose stools and GI bleed in the setting of heme positive stool with anemia.   Loose stools:  Essentially resolved. Only with occasional loose stool at this point. Prior symptoms likely secondary to self-limiting viral gastroenteritis. May have mild post infectious IBS vs intermittent loose stools related to MiraLAX. Recommend holding MiraLAX if still having loose stools.   Abnormal finding in stool: Nursing staff at [facility] report patient began to pass undigested pills in her stool when she developed loose stools in June, likely secondary to rapid transit. Loose stools have almost completely resolved as per above, but she has continued to have occasional pills passing her stool. This may be related to intermittent rapid transit either related to mild post infectious IBS or related to use of MiraLAX prn. We also discussed this finding can be normal for some medications. I do not recommend further evaluation at this time.   Anemia/heme positive stool: Recent hemoglobin 05/30/2023 was 10.5.  This is down from 12.7 on 05/08/2023.  Per nursing staff at [facility], patient has had no overt GI bleeding, but she did test heme positive x 3 in June which is why she was sent for evaluation.  She is currently without any significant GI symptoms.  No NSAIDs.  She was started on pantoprazole and Carafate per provider at [facility].  We discussed the possibility of EGD and colonoscopy for further evaluation as she has never had either of these procedures, but she is not interested in this and prefers to monitor.  Considering her age, multiple comorbidities, I feel this reasonable as risk of procedures may outweigh benefits.  Additionally, if something such as malignancy were found, patient may not be a candidate for treatment.  We will however go ahead and check for iron, B12, and folate deficiencies.   Would be okay to continue pantoprazole for empiric GI protection.     Plan:  Iron panel, B12, folate.  Patient will let me know if she changes her mind and would like to consider EGD/Colonoscopy.  Hold MiraLAX if still having loose stools.  Would be ok to continue pantoprazole daily.  Follow-up in 3 months or sooner if needed.    Ermalinda Memos, PA-C [facility] 06/01/2023

## 2023-05-30 NOTE — Discharge Instructions (Addendum)
PLEASE HOLD ALL OF YOUR DIURETICS (LASIX/ZAROXOLYN) FOR 48 HOURS AS YOU ARE DEHYDATED.  YOU WILL NEED TO HAVE YOUR URINE CHECKED FOR INFECTION IN ONE WEEK.  YOU TOOK A ONE TIME DOSE OF AN ANTBIOTIC IN THE ER BUT DO NOT NEED  PRESCRIPTION

## 2023-06-01 ENCOUNTER — Ambulatory Visit (INDEPENDENT_AMBULATORY_CARE_PROVIDER_SITE_OTHER): Payer: Medicare Other | Admitting: Gastroenterology

## 2023-06-01 VITALS — BP 140/81 | HR 81 | Temp 97.2°F | Ht <= 58 in | Wt 186.0 lb

## 2023-06-01 DIAGNOSIS — D649 Anemia, unspecified: Secondary | ICD-10-CM

## 2023-06-01 DIAGNOSIS — R195 Other fecal abnormalities: Secondary | ICD-10-CM | POA: Diagnosis not present

## 2023-06-01 NOTE — Patient Instructions (Signed)
Please let me know if you like to pursue an upper endoscopy and colonoscopy to further evaluate your stool that tested positive for blood and anemia.  I am checking iron levels, B12, and folate for you.  As we discussed, it can be normal for some medications do not fully digest and free to see components of the pill and or capsule in your stool.  However, if there is concern about absorption, you can consider having your medications crushed.  Overall, I suspect that you likely had a viral illness back in June that caused loose stools.  This takes time to completely resolve/recover from.  Currently, it sounds that your bowel function has almost returned to normal.  I recommend holding MiraLAX if you have loose stools.  We will plan to follow-up in 3 months or sooner if needed.  Ermalinda Memos, PA-C [facility]

## 2023-06-02 ENCOUNTER — Encounter: Payer: Self-pay | Admitting: Gastroenterology

## 2023-06-02 LAB — URINE CULTURE: Culture: >=100000 — AB

## 2023-06-02 LAB — CARBAPENEM RESISTANCE PANEL
Carba Resistance IMP Gene: NOT DETECTED
Carba Resistance KPC Gene: NOT DETECTED
Carba Resistance NDM Gene: NOT DETECTED
Carba Resistance OXA48 Gene: NOT DETECTED
Carba Resistance VIM Gene: NOT DETECTED

## 2023-06-03 ENCOUNTER — Telehealth (HOSPITAL_BASED_OUTPATIENT_CLINIC_OR_DEPARTMENT_OTHER): Payer: Self-pay | Admitting: *Deleted

## 2023-06-03 NOTE — Telephone Encounter (Signed)
Post ED Visit - Positive Culture Follow-up  Culture report reviewed by antimicrobial stewardship pharmacist: [facility] []  Enzo Bi, Pharm.D. []  Celedonio Miyamoto, Pharm.D., BCPS AQ-ID []  Garvin Fila, Pharm.D., BCPS []  Georgina Pillion, [address].D., BCPS []  [place], [place].D., BCPS, AAHIVP []  Estella Husk, Pharm.D., BCPS, AAHIVP []  Lysle Pearl, PharmD, BCPS []  Phillips Climes, PharmD, BCPS []  Agapito Games, PharmD, BCPS []  Verlan Friends, PharmD []  Mervyn Gay, PharmD, BCPS [x]  Wilburn Cornelia, PharmD  [facility] []  Len Childs, PharmD []  Greer Pickerel, PharmD []  Adalberto Cole, PharmD []  Perlie Gold, Rph []  Lonell Face) Jean Rosenthal, PharmD []  Earl Many, PharmD []  Junita Push, PharmD []  Dorna Leitz, PharmD []  Terrilee Files, PharmD []  Lynann Beaver, PharmD []  Keturah Barre, PharmD []  Loralee Pacas, PharmD []  Bernadene Person, PharmD   Positive urine culture Pt received Fosfomycin in ED.  No complaints of UTI symptoms at visit 7/11 follow up visit  and no further patient follow-up is required at this time.  [name] 06/03/2023, 12:58 PM

## 2023-06-18 ENCOUNTER — Telehealth: Payer: Self-pay | Admitting: Gastroenterology

## 2023-06-18 NOTE — Progress Notes (Unsigned)
[name], female    DOB: August 17, 1931    MRN: [id number]   Brief patient profile:  59  yobf  never smoker  referred to pulmonary clinic in [place]  06/19/2023 by [facility] staff   for 02 dep since ?    When last walked weighed 150 lbs now s/p L AKA 12/2022   Admit date: 12/22/2022 Discharge date: 12/31/2022   Admitted From: Long-term care care Disposition: Long-term care   Discharge Condition: Fair CODE STATUS: Full code Diet recommendation: Low-salt and low-carb diet   Discharge summary: [name] is a 89 y.o. female with past medical history significant for DM2, HTN, chronic diastolic congestive heart failure, complete heart block s/p PPM, pulmonary hypertension who presented to [facility] ED via EMS from wound care clinic with left great toe wound.  Patient was seen in the clinic by Dr. Lady Gary with notable oozing frank pus from her left great toe and was referred to the ED for potential need of amputation and angiography.    In the ED, temperature 98.0 F, HR 87, RR 13, BP 147/71, SpO2 94% on room air.  WBC 10.6, hemoglobin 13.5, platelets 342.  Sodium 131, potassium 2.8, chloride 88, CO2 31, glucose 128, BUN 50, creatinine 1.10.  AST 27, ALT 10, total bilirubin 0.5.  CRP 2.7, ESR 86.  Left foot x-ray with no evidence of osteomyelitis, marked soft tissue swelling of the foot, pes planus deformity and collapse of the talus possibly Charcot arthropathy.  Vascular surgery was consulted.  EDP repleted potassium and magnesium.  TRH consulted for admission for further evaluation and management of left great toe gangrene, hyponatremia, hypokalemia.  Patient underwent left above-knee amputation and now clinically improved.   Plan of care:   Left great toe gangrene 2/2 severe PAD Left lower extremity critical limb ischemia s/p AKA Vascular surgery was consulted and patient underwent aortogram, left lower extremity angiogram with findings of occlusion of popliteal artery, anterior  tibial artery, tibioperoneal trunk, peroneal artery, posterior tibial artery, pedal circulation with no options for revascularization.  Patient underwent AKA by vascular surgery, Dr. Juanetta Gosling on 12/26/2022. -- Vascular surgery following, appreciate assistance.  Surgically stable as per surgery. -- Atorvastatin 80 mg p.o. daily -- Gabapentin 300 mg p.o. twice daily, increased to 400 mg twice daily. -- Oxycodone 5 to 10 mg for moderate and severe pain.  Continue aggressive bowel regimen.   --Added Flexeril 5 mg 3 times daily with good symptom control(patient is bedbound and no risk of fall) -- Transfer back to long-term nursing home today. -- Dry dressing and leg sleeve on the left stump.   Hyponatremia: Resolved -- Na 131>>136>136>135>133 -- Resume home furosemide/metolazone for now   Hypokalemia: Resolved Potassium 2.8 on admission.  Repleted. -- K 2.8>>3.8>3.7>3.6>4.0>2.9> 4.6.  Resume potassium supplementation along with diuretics.   Type 2 diabetes mellitus Hemoglobin A1c 7.0, at goal.  Home regimen includes Januvia 50 mg p.o. daily. -- Resume on discharge.   Essential hypertension Chronic diastolic congestive heart failure Home regimen includes carvedilol 6.25 mg p.o. twice daily, furosemide 80 mg p.o. daily, metolazone 2.5 mg on M/T/W/T. -- Resume on discharge.   Hx complete heart block s/p PPM -- Outpatient follow-up with cardiology.  Stable now.  Recently interrogated.   Generalized weakness/debility/gait disturbance: Current resides at [facility].  She is a long-term nursing home resident.  Not mobile.  Anticipate back to nursing home tomorrow.   Medically stable.  Adequate pain relief today.  Stable to  discharge back to long-term care today.   Discharge Diagnoses:    Hypokalemia:   Gangrene of toe of left foot (HCC)   Hyponatremia   Dehydration   Chronic diastolic CHF (congestive heart failure) (HCC)   Essential hypertension   Type 2 diabetes mellitus with  peripheral neuropathy (HCC)         History of Present Illness  06/19/2023  Pulmonary/ 1st office eval/ [name] / [place] [facility]  Chief Complaint  Patient presents with   Establish Care   Shortness of Breath  Dyspnea:  at rest, raspy voice x years  Cough: comes and goes / better with prednisone x sev times since [place]  where resides since ? 2018 ? Sleep: less than 30 degrees  causes back pain and sob  SABA use: rarely  02: 2lpm    No obvious day to day or daytime pattern/variability or assoc excess/ purulent sputum or mucus plugs or hemoptysis or cp or chest tightness, subjective wheeze or overt sinus or hb symptoms.   Sleeping as above  without nocturnal  or early am exacerbation  of respiratory  c/o's or need for noct saba. Also denies any obvious fluctuation of symptoms with weather or environmental changes or other aggravating or alleviating factors except as outlined above   No unusual exposure hx or h/o childhood pna/ asthma or knowledge of premature birth.  Current Allergies, Complete Past Medical History, Past Surgical History, Family History, and Social History were reviewed in [employer] record.  ROS  The following are not active complaints unless bolded Hoarseness, sore throat, dysphagia, dental problems, itching, sneezing,  nasal congestion or discharge of excess mucus or purulent secretions, ear ache,   fever, chills, sweats, unintended wt loss or wt gain, classically pleuritic or exertional cp,  orthopnea pnd or arm/hand swelling  or leg swelling, presyncope, palpitations, abdominal pain, anorexia, nausea, vomiting, diarrhea  or change in bowel habits or change in bladder habits, change in stools or change in urine, dysuria, hematuria,  rash, arthralgias, visual complaints, headache, numbness, weakness or ataxia or problems with walking or coordination,  change in mood or  memory.           Outpatient Medications Prior to Visit  Medication  Sig Dispense Refill   albuterol (VENTOLIN HFA) 108 (90 Base) MCG/ACT inhaler Inhale 2 puffs into the lungs every 6 (six) hours as needed for wheezing or shortness of breath.     Amino Acids-Protein Hydrolys (PRO-STAT AWC) LIQD Take 30 mLs by mouth daily.     ammonium lactate (AMLACTIN) 12 % cream Apply 1 Application topically in the morning and at bedtime.     ascorbic acid (VITAMIN C) 500 MG tablet Take 500 mg by mouth 2 (two) times daily.     Biotin 5000 MCG CAPS Take 5,000 mcg by mouth daily. (0900)     carvedilol (COREG) 6.25 MG tablet Take 6.25 mg by mouth 2 (two) times daily with a meal. (0900 & 1700)     cetirizine (ZYRTEC) 10 MG tablet Take 10 mg by mouth daily.     Cholecalciferol (VITAMIN D) 50 MCG (2000 UT) CAPS Take 2,000 Units by mouth in the morning. (0900)     cyclobenzaprine (FLEXERIL) 5 MG tablet Take 5 mg by mouth 3 (three) times daily as needed for muscle spasms.     furosemide (LASIX) 80 MG tablet Take 80 mg by mouth in the morning. (0900)     guaiFENesin-dextromethorphan (ROBITUSSIN DM) 100-10 MG/5ML syrup Take  5 mLs by mouth every 4 (four) hours as needed for cough.     Magnesium 250 MG TABS Take 1 tablet by mouth daily.     melatonin 3 MG TABS tablet Take 3 mg by mouth at bedtime. (2100)     Menthol, Topical Analgesic, (BIOFREEZE COLORLESS EX) Apply 1 application  topically in the morning, at noon, and at bedtime. for neck pain     metolazone (ZAROXOLYN) 2.5 MG tablet Take 2.5 mg by mouth every other day.     Multiple Vitamins-Minerals (CERTA PLUS) TABS Take by mouth.     nitroGLYCERIN (NITROSTAT) 0.4 MG SL tablet Place 0.4 mg under the tongue every 5 (five) minutes as needed for chest pain (X 3 DOSES FOR CHEST PAIN).     oxyCODONE (OXY IR/ROXICODONE) 5 MG immediate release tablet Take 1 tablet (5 mg total) by mouth every 4 (four) hours as needed for moderate pain or severe pain (1 tablet for pain 4-6, 2 tablet for pain 7-10). 20 tablet 0   pantoprazole (PROTONIX) 40 MG  tablet Take 40 mg by mouth daily.     polyethylene glycol powder (GLYCOLAX/MIRALAX) powder Take 17 g by mouth daily. (0900)     potassium chloride (KLOR-CON) 10 MEQ tablet Take 30 mEq by mouth 2 (two) times daily. (0900 & 2100)     rosuvastatin (CRESTOR) 40 MG tablet Take 40 mg by mouth daily.     sitaGLIPtin (JANUVIA) 50 MG tablet Take 50 mg by mouth in the morning. (0900)     sucralfate (CARAFATE) 1 g tablet Take 1 g by mouth 4 (four) times daily -  with meals and at bedtime.     gabapentin (NEURONTIN) 400 MG capsule Take 1 capsule (400 mg total) by mouth 2 (two) times daily. 60 capsule 0   No facility-administered medications prior to visit.    Past Medical History:  Diagnosis Date   Arthritis    "qwhere"   AV block     2:1  January 14, 2011 / pacemaker plan when cellulitis resolved in length   Bradycardia    2:1  AV block   Cellulitis    Superficial cellulitis right lower leg   January 14, 2011   Chronic diastolic CHF (congestive heart failure) (HCC)    Diabetes mellitus without complication (HCC)    Edema    . EF 65-70%, echo, November 29, 2010   Ejection fraction    EF 65%, echo, January, 2012   Hypertension    Hypokalemia    October, 2012   Murmur    No significant valvular disease by echo, January, 2012   Pacemaker    January, 2013 Medtronic Sensa   Pulmonary hypertension (HCC)     46 mmHg... echo... January, 2012      Objective:     BP 134/78   Pulse 73   SpO2 95% Comment: 2L C  SpO2: 95 % (2L C)  W/c bound hoarse obese bf nad / no upper teeth/ lower incisors that remain are ok   HEENT : Oropharynx  clear / mild pseudowheeze        NECK :  without  apparent JVD/ palpable Nodes/TM    LUNGS: no acc muscle use,  Nl contour chest with distant crackles bases but no cough on insp/exp   CV:  RRR  no s3 or murmur or increase in P2, and no edema   ABD:  obese soft and nontender with limited inspiratory excursion in the supine position. No bruits  or  organomegaly appreciated   MS:   ext warm with L BKA   SKIN: warm and dry without lesions    NEURO:  alert, approp, nl sensorium with  no motor or cerebellar deficits apparent.        I personally reviewed images and agree with radiology impression as follows:  CXR:   portable   05/30/23 Low lung volumes without acute or active cardiopulmonary disease.    I personally reviewed images and agree with radiology impression as follows:   Chest CTa    05/08/23  No PE/ aortic atherosclerosis   BNP   05/04/23  =  25      Assessment   Chronic respiratory failure with hypoxia and hypercapnia c/w OHS) Baseline wt 150 when last ambulatory / 0 2 dep since ?  - HC03   05/30/23   =  33  - ST eval rec 06/19/2023   - IS rec 06/19/2023   She has a component of pseudowheeze/ hoarseness which suggests upper airway cough syndrome but this is not usually assoc with OHS x via the shared risk factor of MO  >>> For now little to offer but IS/ 24 h 02 and max rx for gerd empirically x 6 weeks then return to regroup with ST eval in meantime to r/o aspiration component (no obvious aspiration injury on her last CTa but she likely is a risk.          Each maintenance medication was reviewed in detail including emphasizing most importantly the difference between maintenance and prns and under what circumstances the prns are to be triggered using an action plan format where appropriate.  Total time for H and P, chart review, counseling, reviewing IS device(s) and generating customized AVS unique to this office visit / same day charting  > 60 min complex new pt eval           Sandrea Hughs, MD 06/19/2023

## 2023-06-18 NOTE — Telephone Encounter (Signed)
Reviewed labs completed 06/06/23.   Hgb declined further to 9.7 on 7/16. Iron panel is more consistent with anemia of chronic disease as her iron levels are low normal, but ferritin is elevated. I had asked for B12 and folate, but I do not see that these were completed.   We need to find out if patient has had any bright red blood per rectum or black, tarry stools.   Patient previously declined endoscopic evaluation of anemia with heme positive. We can proceed with procedures if she changes her mind.   Otherwise, would recommend routine monitoring of hemoglobin by primary provider at facility.    Courtney: Please let facility and daughter know the above.

## 2023-06-19 ENCOUNTER — Ambulatory Visit: Payer: Medicare Other | Admitting: Internal Medicine

## 2023-06-19 ENCOUNTER — Encounter: Payer: Self-pay | Admitting: Internal Medicine

## 2023-06-19 VITALS — BP 134/78 | HR 73

## 2023-06-19 DIAGNOSIS — J9611 Chronic respiratory failure with hypoxia: Secondary | ICD-10-CM

## 2023-06-19 DIAGNOSIS — J9612 Chronic respiratory failure with hypercapnia: Secondary | ICD-10-CM | POA: Diagnosis not present

## 2023-06-19 NOTE — Assessment & Plan Note (Addendum)
Baseline wt 150 when last ambulatory / 0 2 dep since ?  - HC03   05/30/23   =  33  - ST eval rec 06/19/2023   - IS rec 06/19/2023   She has a component of pseudowheeze/ hoarseness which suggests upper airway cough syndrome but this is not usually assoc with OHS x via the shared risk factor of MO  >>> For now little to offer but IS/ 24 h 02 and max rx for gerd empirically x 6 weeks then return to regroup with ST eval in meantime to r/o aspiration component (no obvious aspiration injury on her last CTa but she likely is a risk.  >>> target 02 sats in lower 90s           Each maintenance medication was reviewed in detail including emphasizing most importantly the difference between maintenance and prns and under what circumstances the prns are to be triggered using an action plan format where appropriate.  Total time for H and P, chart review, counseling, reviewing IS device(s) and generating customized AVS unique to this office visit / same day charting  > 60 min complex new pt eval

## 2023-06-19 NOTE — Patient Instructions (Addendum)
Change protonix to 40 mg Take 30- 60 min before your first and last meals of the day   GERD (REFLUX)  is an extremely common cause of respiratory symptoms just like yours , many times with no obvious heartburn at all.    It can be treated with medication, but also with lifestyle changes including elevation of the head of your bed (ideally with 6 -8inch blocks under the headboard of your bed),  Smoking cessation, avoidance of late meals, excessive alcohol, and avoid fatty foods, chocolate, peppermint, colas, red wine, and acidic juices such as orange juice.  NO MINT OR MENTHOL PRODUCTS SO NO COUGH DROPS (Luden's ok)  USE SUGARLESS CANDY INSTEAD (Jolley ranchers or Stover's or Life Savers) or even ice chips will also do - the key is to swallow to prevent all throat clearing. NO OIL BASED VITAMINS - use powdered substitutes.  Avoid fish oil when coughing.  Incentive spirometry every hour while awake and bring it back with you  Keep HOB at least up 30 degrees  Speech Therapy eval for coughing after eating   Please schedule a follow up office visit in 6 weeks, call sooner if needed with Incentive spirometry

## 2023-06-20 NOTE — Telephone Encounter (Signed)
Called facility several time. No one answered

## 2023-06-28 NOTE — Telephone Encounter (Signed)
Toni Amend, can you try reaching out again? Please also update the daughter as she was helping patient make decisions when I saw her in the office.

## 2023-06-28 NOTE — Telephone Encounter (Signed)
Spoke to pt's daughter informed her of results and recommendations. She voiced understanding. She states it is her mothers decisions to have procedure done. I called facility several times and finally spoke with nurse Lelon Mast informed her of results and recommendations. She voiced understanding. She states they have not noticed any blood or black stools. She states she would talk to pt to see if she is will to have procedure.

## 2023-06-28 NOTE — Telephone Encounter (Signed)
Noted  

## 2023-07-10 ENCOUNTER — Ambulatory Visit (INDEPENDENT_AMBULATORY_CARE_PROVIDER_SITE_OTHER): Payer: Medicare Other

## 2023-07-10 DIAGNOSIS — I442 Atrioventricular block, complete: Secondary | ICD-10-CM | POA: Diagnosis not present

## 2023-07-10 LAB — CUP PACEART REMOTE DEVICE CHECK
Battery Remaining Longevity: 121 mo
Battery Voltage: 3.03 V
Brady Statistic AP VP Percent: 12.71 %
Brady Statistic AP VS Percent: 0 %
Brady Statistic AS VP Percent: 87.28 %
Brady Statistic AS VS Percent: 0.02 %
Brady Statistic RA Percent Paced: 12.67 %
Brady Statistic RV Percent Paced: 99.98 %
Date Time Interrogation Session: 20240819002314
Implantable Lead Connection Status: 753985
Implantable Lead Connection Status: 753985
Implantable Lead Implant Date: 20130118
Implantable Lead Implant Date: 20130118
Implantable Lead Location: 753859
Implantable Lead Location: 753860
Implantable Lead Model: 5076
Implantable Lead Model: 5076
Implantable Pulse Generator Implant Date: 20230213
Lead Channel Impedance Value: 266 Ohm
Lead Channel Impedance Value: 342 Ohm
Lead Channel Impedance Value: 361 Ohm
Lead Channel Impedance Value: 399 Ohm
Lead Channel Pacing Threshold Amplitude: 0.75 V
Lead Channel Pacing Threshold Amplitude: 1.375 V
Lead Channel Pacing Threshold Pulse Width: 0.4 ms
Lead Channel Pacing Threshold Pulse Width: 0.4 ms
Lead Channel Sensing Intrinsic Amplitude: 0.75 mV
Lead Channel Sensing Intrinsic Amplitude: 0.75 mV
Lead Channel Sensing Intrinsic Amplitude: 3.375 mV
Lead Channel Sensing Intrinsic Amplitude: 3.375 mV
Lead Channel Setting Pacing Amplitude: 2 V
Lead Channel Setting Pacing Amplitude: 2.75 V
Lead Channel Setting Pacing Pulse Width: 0.4 ms
Lead Channel Setting Sensing Sensitivity: 2.8 mV
Zone Setting Status: 755011
Zone Setting Status: 755011

## 2023-07-17 ENCOUNTER — Other Ambulatory Visit: Payer: Self-pay | Admitting: Nephrology

## 2023-07-17 DIAGNOSIS — N1832 Chronic kidney disease, stage 3b: Secondary | ICD-10-CM

## 2023-07-17 DIAGNOSIS — E1122 Type 2 diabetes mellitus with diabetic chronic kidney disease: Secondary | ICD-10-CM

## 2023-07-19 NOTE — Progress Notes (Signed)
Remote pacemaker transmission.   

## 2023-08-01 ENCOUNTER — Ambulatory Visit: Payer: Medicare Other | Admitting: Internal Medicine

## 2023-08-01 ENCOUNTER — Ambulatory Visit (HOSPITAL_COMMUNITY)
Admission: RE | Admit: 2023-08-01 | Discharge: 2023-08-01 | Disposition: A | Discharge status: Home / Self Care | Payer: Medicare Other | Source: Ambulatory Visit | Attending: Nephrology | Admitting: Nephrology

## 2023-08-01 DIAGNOSIS — E1122 Type 2 diabetes mellitus with diabetic chronic kidney disease: Secondary | ICD-10-CM | POA: Diagnosis present

## 2023-08-01 DIAGNOSIS — N1832 Chronic kidney disease, stage 3b: Secondary | ICD-10-CM | POA: Diagnosis present

## 2023-08-03 NOTE — Progress Notes (Signed)
[name], female    DOB: 08/31/1931    MRN: [id number]   Brief patient profile:  30  yobf  never smoker  referred to pulmonary clinic in [place]  06/19/2023 by [facility] staff   for 02 dep since  2/ 2024  When last walked weighed 150 lbs now s/p L AKA 12/2022   Admit date: 12/22/2022 Discharge date: 12/31/2022   Admitted From: Long-term care care Disposition: Long-term care   Discharge Condition: Fair CODE STATUS: Full code Diet recommendation: Low-salt and low-carb diet   Discharge summary: [name] is a 89 y.o. female with past medical history significant for DM2, HTN, chronic diastolic congestive heart failure, complete heart block s/p PPM, pulmonary hypertension who presented to [facility] ED via EMS from wound care clinic with left great toe wound.  Patient was seen in the clinic by Dr. Lady Gary with notable oozing frank pus from her left great toe and was referred to the ED for potential need of amputation and angiography.    In the ED, temperature 98.0 F, HR 87, RR 13, BP 147/71, SpO2 94% on room air.  WBC 10.6, hemoglobin 13.5, platelets 342.  Sodium 131, potassium 2.8, chloride 88, CO2 31, glucose 128, BUN 50, creatinine 1.10.  AST 27, ALT 10, total bilirubin 0.5.  CRP 2.7, ESR 86.  Left foot x-ray with no evidence of osteomyelitis, marked soft tissue swelling of the foot, pes planus deformity and collapse of the talus possibly Charcot arthropathy.  Vascular surgery was consulted.  EDP repleted potassium and magnesium.  TRH consulted for admission for further evaluation and management of left great toe gangrene, hyponatremia, hypokalemia.  Patient underwent left above-knee amputation and now clinically improved.   Plan of care:   Left great toe gangrene 2/2 severe PAD Left lower extremity critical limb ischemia s/p AKA Vascular surgery was consulted and patient underwent aortogram, left lower extremity angiogram with findings of occlusion of popliteal artery, anterior  tibial artery, tibioperoneal trunk, peroneal artery, posterior tibial artery, pedal circulation with no options for revascularization.  Patient underwent AKA by vascular surgery, Dr. Juanetta Gosling on 12/26/2022. -- Vascular surgery following, appreciate assistance.  Surgically stable as per surgery. -- Atorvastatin 80 mg p.o. daily -- Gabapentin 300 mg p.o. twice daily, increased to 400 mg twice daily. -- Oxycodone 5 to 10 mg for moderate and severe pain.  Continue aggressive bowel regimen.   --Added Flexeril 5 mg 3 times daily with good symptom control(patient is bedbound and no risk of fall) -- Transfer back to long-term nursing home today. -- Dry dressing and leg sleeve on the left stump.   Hyponatremia: Resolved -- Na 131>>136>136>135>133 -- Resume home furosemide/metolazone for now   Hypokalemia: Resolved Potassium 2.8 on admission.  Repleted. -- K 2.8>>3.8>3.7>3.6>4.0>2.9> 4.6.  Resume potassium supplementation along with diuretics.   Type 2 diabetes mellitus Hemoglobin A1c 7.0, at goal.  Home regimen includes Januvia 50 mg p.o. daily. -- Resume on discharge.   Essential hypertension Chronic diastolic congestive heart failure Home regimen includes carvedilol 6.25 mg p.o. twice daily, furosemide 80 mg p.o. daily, metolazone 2.5 mg on M/T/W/T. -- Resume on discharge.   Hx complete heart block s/p PPM -- Outpatient follow-up with cardiology.  Stable now.  Recently interrogated.   Generalized weakness/debility/gait disturbance: Current resides at [facility].  She is a long-term nursing home resident.  Not mobile.  Anticipate back to nursing home tomorrow.   Medically stable.  Adequate pain relief today.  Stable to  discharge back to long-term care today.   Discharge Diagnoses:    Hypokalemia:   Gangrene of toe of left foot (HCC)   Hyponatremia   Dehydration   Chronic diastolic CHF (congestive heart failure) (HCC)   Essential hypertension   Type 2 diabetes mellitus with  peripheral neuropathy (HCC)         History of Present Illness  06/19/2023  Pulmonary/ 1st office eval/ [name] / [place] [facility]  Chief Complaint  Patient presents with   Establish Care   Shortness of Breath  Dyspnea:  at rest, raspy voice x years  Cough: comes and goes / better with prednisone x sev times since [place]  where resides since ? 2018 ? Sleep: less than 30 degrees  causes back pain and sob  SABA use: rarely  02: 2lpm  Rec Change protonix to 40 mg Take 30- 60 min before your first and last meals of the day  GERD diet reviewed, bed blocks rec  Incentive spirometry every hour while awake and bring it back with you Keep HOB at least up 30 degrees Speech Therapy eval for coughing after eating  Please schedule a follow up office visit in 6 weeks, call sooner if needed with Incentive spirometry      08/04/2023  f/u ov/[place] [facility]/[name] re: hypoxemic/hypercarbic resp failure  maint on 02 2lpm   Chief Complaint  Patient presents with   Follow-up   Dyspnea:  not active/ w/c bound  Cough: still hoarse  Sleeping: 45 degrees  s    resp cc  SABA use: not using  02: 2 lpm    No obvious day to day or daytime variability or assoc excess/ purulent sputum or mucus plugs or hemoptysis or cp or chest tightness, subjective wheeze or overt sinus or hb symptoms.    Also denies any obvious fluctuation of symptoms with weather or environmental changes or other aggravating or alleviating factors except as outlined above   No unusual exposure hx or h/o childhood pna/ asthma or knowledge of premature birth.  Current Allergies, Complete Past Medical History, Past Surgical History, Family History, and Social History were reviewed in [employer] record.  ROS  The following are not active complaints unless bolded Hoarseness, sore throat, dysphagia, dental problems, itching, sneezing,  nasal congestion or discharge of excess mucus or purulent secretions, ear  ache,   fever, chills, sweats, unintended wt loss or wt gain, classically pleuritic or exertional cp,  orthopnea pnd or arm/hand swelling  or leg swelling, presyncope, palpitations, abdominal pain, anorexia, nausea, vomiting, diarrhea  or change in bowel habits or change in bladder habits, change in stools or change in urine, dysuria, hematuria,  rash, arthralgias, visual complaints, headache, numbness, weakness or ataxia or problems with walking s/p L AKA or coordination,  change in mood or  memory.        Current Meds  Medication Sig   albuterol (VENTOLIN HFA) 108 (90 Base) MCG/ACT inhaler Inhale 2 puffs into the lungs every 6 (six) hours as needed for wheezing or shortness of breath.   Amino Acids-Protein Hydrolys (PRO-STAT AWC) LIQD Take 30 mLs by mouth daily.   ammonium lactate (AMLACTIN) 12 % cream Apply 1 Application topically in the morning and at bedtime.   ascorbic acid (VITAMIN C) 500 MG tablet Take 500 mg by mouth 2 (two) times daily.   Biotin 5000 MCG CAPS Take 5,000 mcg by mouth daily. (0900)   carvedilol (COREG) 6.25 MG tablet Take 6.25 mg by  mouth 2 (two) times daily with a meal. (0900 & 1700)   cetirizine (ZYRTEC) 10 MG tablet Take 10 mg by mouth daily.   Cholecalciferol (VITAMIN D) 50 MCG (2000 UT) CAPS Take 2,000 Units by mouth in the morning. (0900)   Cranberry 360 MG CAPS Take by mouth.   cyclobenzaprine (FLEXERIL) 5 MG tablet Take 5 mg by mouth 3 (three) times daily as needed for muscle spasms.   furosemide (LASIX) 80 MG tablet Take 80 mg by mouth in the morning. (0900)   guaiFENesin-dextromethorphan (ROBITUSSIN DM) 100-10 MG/5ML syrup Take 5 mLs by mouth every 4 (four) hours as needed for cough.   Magnesium 250 MG TABS Take 1 tablet by mouth daily.   melatonin 3 MG TABS tablet Take 3 mg by mouth at bedtime. (2100)   Menthol, Topical Analgesic, (BIOFREEZE COLORLESS EX) Apply 1 application  topically in the morning, at noon, and at bedtime. for neck pain   metolazone  (ZAROXOLYN) 2.5 MG tablet Take 2.5 mg by mouth every other day.   Multiple Vitamins-Minerals (CERTA PLUS) TABS Take by mouth.   nitroGLYCERIN (NITROSTAT) 0.4 MG SL tablet Place 0.4 mg under the tongue every 5 (five) minutes as needed for chest pain (X 3 DOSES FOR CHEST PAIN).   oxyCODONE (OXY IR/ROXICODONE) 5 MG immediate release tablet Take 1 tablet (5 mg total) by mouth every 4 (four) hours as needed for moderate pain or severe pain (1 tablet for pain 4-6, 2 tablet for pain 7-10).   pantoprazole (PROTONIX) 40 MG tablet Take 40 mg by mouth daily.   polyethylene glycol powder (GLYCOLAX/MIRALAX) powder Take 17 g by mouth daily. (0900)   potassium chloride (KLOR-CON) 10 MEQ tablet Take 30 mEq by mouth 2 (two) times daily. (0900 & 2100)   rosuvastatin (CRESTOR) 40 MG tablet Take 40 mg by mouth daily.   sitaGLIPtin (JANUVIA) 50 MG tablet Take 50 mg by mouth in the morning. (0900)   sucralfate (CARAFATE) 1 g tablet Take 1 g by mouth 4 (four) times daily -  with meals and at bedtime.             Past Medical History:  Diagnosis Date   Arthritis    "qwhere"   AV block     2:1  January 14, 2011 / pacemaker plan when cellulitis resolved in length   Bradycardia    2:1  AV block   Cellulitis    Superficial cellulitis right lower leg   January 14, 2011   Chronic diastolic CHF (congestive heart failure) (HCC)    Diabetes mellitus without complication (HCC)    Edema    . EF 65-70%, echo, November 29, 2010   Ejection fraction    EF 65%, echo, January, 2012   Hypertension    Hypokalemia    October, 2012   Murmur    No significant valvular disease by echo, January, 2012   Pacemaker    January, 2013 Medtronic Sensa   Pulmonary hypertension (HCC)     46 mmHg... echo... January, 2012      Objective:    Wt Readings from Last 3 Encounters:  08/04/23 186 lb (84.4 kg)  06/01/23 186 lb (84.4 kg)  05/30/23 186 lb 1.1 oz (84.4 kg)     Vital signs reviewed  08/04/2023  - Note at rest 02 sats   95% on 2lpm    General appearance:    w/c bound MO hoarse ederly bf nad     HEENT : Oropharynx  clear    /  no pseudowheeze now  NECK :  without  apparent JVD/ palpable Nodes/TM    LUNGS: no acc muscle use,  Nl contour chest which is clear to A and P bilaterally without cough on insp or exp maneuvers   CV:  RRR  no s3 or murmur or increase in P2, and trace pitting R leg   ABD: obese soft and nontender with limited  inspiratory excursion   MS:   ext warm without deformities Or obvious joint restrictions  calf tenderness, cyanosis or clubbing    SKIN: warm and dry without lesions    NEURO:  alert, approp, nl sensorium with  no motor or cerebellar deficits apparent.          Assessment

## 2023-08-04 ENCOUNTER — Ambulatory Visit (INDEPENDENT_AMBULATORY_CARE_PROVIDER_SITE_OTHER): Payer: Medicare Other | Admitting: Internal Medicine

## 2023-08-04 ENCOUNTER — Encounter: Payer: Self-pay | Admitting: Internal Medicine

## 2023-08-04 VITALS — BP 155/64 | HR 60 | Wt 186.0 lb

## 2023-08-04 DIAGNOSIS — E440 Moderate protein-calorie malnutrition: Secondary | ICD-10-CM | POA: Diagnosis not present

## 2023-08-04 DIAGNOSIS — I5033 Acute on chronic diastolic (congestive) heart failure: Secondary | ICD-10-CM

## 2023-08-04 DIAGNOSIS — J9611 Chronic respiratory failure with hypoxia: Secondary | ICD-10-CM

## 2023-08-04 DIAGNOSIS — J9612 Chronic respiratory failure with hypercapnia: Secondary | ICD-10-CM

## 2023-08-04 NOTE — Patient Instructions (Signed)
No change in recommendations   Incentive spirometry daily - can purchase @ [employer] for ten dollars   Only use your albuterol as a rescue medication to be used if you can't catch your breath by resting or doing a relaxed purse lip breathing pattern.  - The less you use it, the better it will work when you need it. - Ok to use up to 2 puffs  every 4 hours if you must but call for immediate appointment if use goes up over your usual need    Pulmonary follow up as needed

## 2023-08-04 NOTE — Assessment & Plan Note (Signed)
Never smoker/ Baseline wt 150 when last ambulatory / 0 2 dep since ?  - HC03   05/30/23   =  33  - ST eval rec 06/19/2023   - IS rec 06/19/2023 > not done as of 08/04/2023 > suggested she get one on [employer]  Appears to have classic OHS with minimal AB so no great options here given how immobile she has become and would hold off on noct vent as she remains well compensated at present.   Can still use saba as  needed. (She says she has not requested it)  Rx as above/ f/u is prn           Each maintenance medication was reviewed in detail including emphasizing most importantly the difference between maintenance and prns and under what circumstances the prns are to be triggered using an action plan format where appropriate.  Total time for H and P, chart review, counseling, reviewing hfa/02/ IS  device(s) and generating customized AVS unique to this office visit / same day charting = 30 min

## 2023-08-29 NOTE — Progress Notes (Signed)
Letter faxed.

## 2023-08-29 NOTE — Progress Notes (Unsigned)
Referring Provider: Lindaann Pascal Primary Care Physician:  Lindaann Pascal Primary GI Physician: Dr. Marletta Lor  No chief complaint on file.   HPI:   [name] is a 89 y.o. female  who resides at [facility] with history of heart block with pacemaker in place, CHF, diabetes, HTN, left above-the-knee amputation February 2024 for ischemia, presenting today for follow-up of anemia with heme positive stool.  Last seen in our office 06/01/2023 at the time of initial consult for the same.  She had recent hemoglobin of 10.5 on 7/9.  This was down from hemoglobin of 12.7 on 6/17.  Per nursing staff at [facility], patient had no overt GI bleeding, but she did have heme positive stool x 3 in June which is why she was sent for evaluation.  She denied any significant GI symptoms.  No NSAIDs.  She was started on pantoprazole and Carafate per provider at [facility].  We discussed the possibility of an EGD and colonoscopy for further evaluation as she had never had either of these procedures, but patient was not interested and preferred to monitor.  Considering her age, multiple comorbidities, I felt this was reasonable as risk of procedures may outweigh benefit.  Additionally, if something such as malignancy were found, patient would not be a good candidate for treatment.  Plan to check for iron, B12, folate deficiencies.  Okay to continue pantoprazole empirically for GI protection.  Follow-up in 3 months or sooner if needed.  Labs completed 06/06/2023.  Hemoglobin had declined further to 9.7.  Iron panel was more consistent with anemia of chronic disease as iron levels were low normal, but ferritin elevated.  B12 and folate were not completed.  Facility again stated patient had not had any overt GI bleeding.  Recommended routine monitoring of hemoglobin by primary provider at the facility and that we could still proceed with EGD and colonoscopy if patient changed her mind.  Today:   Past Medical  History:  Diagnosis Date   Arthritis    "qwhere"   AV block     2:1  January 14, 2011 / pacemaker plan when cellulitis resolved in length   Bradycardia    2:1  AV block   Cellulitis    Superficial cellulitis right lower leg   January 14, 2011   Chronic diastolic CHF (congestive heart failure) (HCC)    Diabetes mellitus without complication (HCC)    Edema    . EF 65-70%, echo, November 29, 2010   Ejection fraction    EF 65%, echo, January, 2012   Hypertension    Hypokalemia    October, 2012   Murmur    No significant valvular disease by echo, January, 2012   Pacemaker    January, 2013 Medtronic Sensa   Pulmonary hypertension (HCC)     46 mmHg... echo... January, 2012    Past Surgical History:  Procedure Laterality Date   ABDOMINAL AORTOGRAM W/LOWER EXTREMITY Left 12/23/2022   Procedure: ABDOMINAL AORTOGRAM W/LOWER EXTREMITY;  Surgeon: Leonie Douglas, MD;  Location: [facility] INVASIVE CV LAB;  Service: Cardiovascular;  Laterality: Left;   AMPUTATION Left 12/26/2022   Procedure: AMPUTATION ABOVE KNEE;  Surgeon: Leonie Douglas, MD;  Location: [facility] OR;  Service: Vascular;  Laterality: Left;   APPLICATION OF WOUND VAC Left 12/26/2022   Procedure: APPLICATION OF WOUND VAC;  Surgeon: Leonie Douglas, MD;  Location: [facility] OR;  Service: Vascular;  Laterality: Left;   CHOLECYSTECTOMY     HIP ARTHROPLASTY  Right    INSERT / REPLACE / REMOVE PACEMAKER  11/2011   PARTIAL HYSTERECTOMY     vaginal   PERMANENT PACEMAKER INSERTION N/A 12/09/2011   Procedure: PERMANENT PACEMAKER INSERTION;  Surgeon: Marinus Maw, MD;  Location: [facility] CATH LAB;  Service: Cardiovascular;  Laterality: N/A;   PPM GENERATOR CHANGEOUT N/A 01/03/2022   Procedure: PPM GENERATOR CHANGEOUT;  Surgeon: Marinus Maw, MD;  Location: [facility] INVASIVE CV LAB;  Service: Cardiovascular;  Laterality: N/A;    Current Outpatient Medications  Medication Sig Dispense Refill   albuterol (VENTOLIN HFA) 108 (90 Base) MCG/ACT inhaler Inhale 2 puffs  into the lungs every 6 (six) hours as needed for wheezing or shortness of breath.     Amino Acids-Protein Hydrolys (PRO-STAT AWC) LIQD Take 30 mLs by mouth daily.     ammonium lactate (AMLACTIN) 12 % cream Apply 1 Application topically in the morning and at bedtime.     ascorbic acid (VITAMIN C) 500 MG tablet Take 500 mg by mouth 2 (two) times daily.     Biotin 5000 MCG CAPS Take 5,000 mcg by mouth daily. (0900)     carvedilol (COREG) 6.25 MG tablet Take 6.25 mg by mouth 2 (two) times daily with a meal. (0900 & 1700)     cetirizine (ZYRTEC) 10 MG tablet Take 10 mg by mouth daily.     Cholecalciferol (VITAMIN D) 50 MCG (2000 UT) CAPS Take 2,000 Units by mouth in the morning. (0900)     Cranberry 360 MG CAPS Take by mouth.     cyclobenzaprine (FLEXERIL) 5 MG tablet Take 5 mg by mouth 3 (three) times daily as needed for muscle spasms.     furosemide (LASIX) 80 MG tablet Take 80 mg by mouth in the morning. (0900)     gabapentin (NEURONTIN) 400 MG capsule Take 1 capsule (400 mg total) by mouth 2 (two) times daily. 60 capsule 0   guaiFENesin-dextromethorphan (ROBITUSSIN DM) 100-10 MG/5ML syrup Take 5 mLs by mouth every 4 (four) hours as needed for cough.     Magnesium 250 MG TABS Take 1 tablet by mouth daily.     melatonin 3 MG TABS tablet Take 3 mg by mouth at bedtime. (2100)     Menthol, Topical Analgesic, (BIOFREEZE COLORLESS EX) Apply 1 application  topically in the morning, at noon, and at bedtime. for neck pain     metolazone (ZAROXOLYN) 2.5 MG tablet Take 2.5 mg by mouth every other day.     Multiple Vitamins-Minerals (CERTA PLUS) TABS Take by mouth.     nitroGLYCERIN (NITROSTAT) 0.4 MG SL tablet Place 0.4 mg under the tongue every 5 (five) minutes as needed for chest pain (X 3 DOSES FOR CHEST PAIN).     oxyCODONE (OXY IR/ROXICODONE) 5 MG immediate release tablet Take 1 tablet (5 mg total) by mouth every 4 (four) hours as needed for moderate pain or severe pain (1 tablet for pain 4-6, 2 tablet  for pain 7-10). 20 tablet 0   pantoprazole (PROTONIX) 40 MG tablet Take 40 mg by mouth daily.     polyethylene glycol powder (GLYCOLAX/MIRALAX) powder Take 17 g by mouth daily. (0900)     potassium chloride (KLOR-CON) 10 MEQ tablet Take 30 mEq by mouth 2 (two) times daily. (0900 & 2100)     rosuvastatin (CRESTOR) 40 MG tablet Take 40 mg by mouth daily.     sitaGLIPtin (JANUVIA) 50 MG tablet Take 50 mg by mouth in the morning. (0900)  sucralfate (CARAFATE) 1 g tablet Take 1 g by mouth 4 (four) times daily -  with meals and at bedtime.     No current facility-administered medications for this visit.    Allergies as of 08/31/2023 - Review Complete 08/04/2023  Allergen Reaction Noted   Aspirin Rash and Other (See Comments)    Tramadol Other (See Comments) 09/03/2014   Naproxen  11/24/2016   Shellfish-derived products Itching and Rash 12/10/2021    Family History  Problem Relation Age of Onset   Cardiomyopathy Father    Diabetes Father    Heart failure Father    Dementia Mother    Cancer Son    Coronary artery disease Son    Heart attack Neg Hx     Social History   Socioeconomic History   Marital status: Widowed    Spouse name: Not on file   Number of children: Not on file   Years of education: Not on file   Highest education level: Not on file  Occupational History   Occupation: Retired    Associate Professor: RETIRED  Tobacco Use   Smoking status: Never   Smokeless tobacco: Never  Vaping Use   Vaping status: Never Used  Substance and Sexual Activity   Alcohol use: No   Drug use: No   Sexual activity: Never  Other Topics Concern   Not on file  Social History Narrative   Retired    Widowed    Tobacco Use - No.    Alcohol Use - no   Regular Exercise - no   Drug Use - no   Social Determinants of Corporate investment banker Strain: Not on file  Food Insecurity: Not on file  Transportation Needs: Not on file  Physical Activity: Not on file  Stress: Not on file   Social Connections: Not on file    Review of Systems: Gen: Denies fever, chills, anorexia. Denies fatigue, weakness, weight loss.  CV: Denies chest pain, palpitations, syncope, peripheral edema, and claudication. Resp: Denies dyspnea at rest, cough, wheezing, coughing up blood, and pleurisy. GI: Denies vomiting blood, jaundice, and fecal incontinence.   Denies dysphagia or odynophagia. Derm: Denies rash, itching, dry skin Psych: Denies depression, anxiety, memory loss, confusion. No homicidal or suicidal ideation.  Heme: Denies bruising, bleeding, and enlarged lymph nodes.  Physical Exam: There were no vitals taken for this visit. General:   Alert and oriented. No distress noted. Pleasant and cooperative.  Head:  Normocephalic and atraumatic. Eyes:  Conjuctiva clear without scleral icterus. Heart:  S1, S2 present without murmurs appreciated. Lungs:  Clear to auscultation bilaterally. No wheezes, rales, or rhonchi. No distress.  Abdomen:  +BS, soft, non-tender and non-distended. No rebound or guarding. No HSM or masses noted. Msk:  Symmetrical without gross deformities. Normal posture. Extremities:  Without edema. Neurologic:  Alert and  oriented x4 Psych:  Normal mood and affect.    Assessment:     Plan:  ***   Ermalinda Memos, PA-C [facility] 08/31/2023

## 2023-08-29 NOTE — Progress Notes (Signed)
Name: [name] DOB: [birth year] MRN: [id number]  History of Present Illness: [name] is a 89 y.o. female who presents today as a new patient at [facility] [place]. All available relevant medical records have been reviewed. She resides at [facility] and is accompanied by her son [name],  who assisted with giving history due to patient's cognitive communication deficit.  She reports chief complaint of recurrent UTls.  Urine culture results in past 12 months: - 05/30/2023: Positive for Enterococcus cloacae - Per 08/14/2023 note by Baker Pierini NP: Recent urine culture positive for Klebsiella oxytoca and Klebsiella pneumoniae; treated with Bactrim.  Recent relevant imaging: - 08/01/2023: RUS performed however the study quality was significantly limited / degraded by patient body habitus. The right kidney and bladder were not visualized. Very limited visualization of the left kidney was unremarkable.  Urinary Symptoms: Her son reports that she has had 3+ UTl's in the last year. When present, UTI symptoms include dysuria, increased confusion, increased fatigue. Denies fevers. Per her medication list from [facility] she is currently taking a cranberry supplement for UTI prevention.  She denies acute UTI symptoms today.  At baseline: She reports urinary frequency which may be related to her Lasix use. She voids primarily with urinary incontinence with sensory awareness into diapers. She is bedbound. Her son denies history of gross hematuria. Patient states is unable to tell whether or not she is fully emptying her bladder.  She denies caffeine intake.  Her son denies any known history of pyelonephritis or kidney stones.  Vaginal / prolapse Symptoms: She denies vaginal pain, bleeding, or discharge.   Bowel Symptoms: She has a history of constipation, possibly related to her daily opioid medication use for management of chronic back pain.  Per her medication  list from [facility] she is currently taking a Miralax daily for constipation management.  Past OB/GYN History: OB History   No obstetric history on file.    She is not sexually active.  She has 4 adult children. She is post menopausal.    Fall Screening: Do you usually have a device to assist in your mobility? Yes - full lift assist (bedbound; requires Hoyer lift)  Medications: Current Outpatient Medications  Medication Sig Dispense Refill   albuterol (VENTOLIN HFA) 108 (90 Base) MCG/ACT inhaler Inhale 2 puffs into the lungs every 6 (six) hours as needed for wheezing or shortness of breath.     Amino Acids-Protein Hydrolys (PRO-STAT AWC) LIQD Take 30 mLs by mouth daily.     ammonium lactate (AMLACTIN) 12 % cream Apply 1 Application topically in the morning and at bedtime.     ascorbic acid (VITAMIN C) 500 MG tablet Take 500 mg by mouth 2 (two) times daily.     Biotin 5000 MCG CAPS Take 5,000 mcg by mouth daily. (0900)     carvedilol (COREG) 6.25 MG tablet Take 6.25 mg by mouth 2 (two) times daily with a meal. (0900 & 1700)     cetirizine (ZYRTEC) 10 MG tablet Take 10 mg by mouth daily.     Cholecalciferol (VITAMIN D) 50 MCG (2000 UT) CAPS Take 2,000 Units by mouth in the morning. (0900)     Cranberry 360 MG CAPS Take by mouth.     cyclobenzaprine (FLEXERIL) 5 MG tablet Take 5 mg by mouth 3 (three) times daily as needed for muscle spasms.     estradiol (ESTRACE) 0.1 MG/GM vaginal cream Discard plastic applicator. Insert a blueberry size  amount (approximately 1 gram) of cream on gloved fingertip inside vagina at bedtime every night. For long term use. 42.5 g 5   furosemide (LASIX) 80 MG tablet Take 80 mg by mouth in the morning. (0900)     guaiFENesin-dextromethorphan (ROBITUSSIN DM) 100-10 MG/5ML syrup Take 5 mLs by mouth every 4 (four) hours as needed for cough.     Magnesium 250 MG TABS Take 1 tablet by mouth daily.     melatonin 3 MG TABS tablet Take 3 mg by mouth at  bedtime. (2100)     Menthol, Topical Analgesic, (BIOFREEZE COLORLESS EX) Apply 1 application  topically in the morning, at noon, and at bedtime. for neck pain     metolazone (ZAROXOLYN) 2.5 MG tablet Take 2.5 mg by mouth every other day.     Multiple Vitamins-Minerals (CERTA PLUS) TABS Take by mouth.     nitroGLYCERIN (NITROSTAT) 0.4 MG SL tablet Place 0.4 mg under the tongue every 5 (five) minutes as needed for chest pain (X 3 DOSES FOR CHEST PAIN).     oxyCODONE (OXY IR/ROXICODONE) 5 MG immediate release tablet Take 1 tablet (5 mg total) by mouth every 4 (four) hours as needed for moderate pain or severe pain (1 tablet for pain 4-6, 2 tablet for pain 7-10). 20 tablet 0   pantoprazole (PROTONIX) 40 MG tablet Take 40 mg by mouth daily.     polyethylene glycol powder (GLYCOLAX/MIRALAX) powder Take 17 g by mouth daily. (0900)     potassium chloride (KLOR-CON) 10 MEQ tablet Take 30 mEq by mouth 2 (two) times daily. (0900 & 2100)     rosuvastatin (CRESTOR) 40 MG tablet Take 40 mg by mouth daily.     sitaGLIPtin (JANUVIA) 50 MG tablet Take 50 mg by mouth in the morning. (0900)     sucralfate (CARAFATE) 1 g tablet Take 1 g by mouth 4 (four) times daily -  with meals and at bedtime.     gabapentin (NEURONTIN) 400 MG capsule Take 1 capsule (400 mg total) by mouth 2 (two) times daily. 60 capsule 0   No current facility-administered medications for this visit.    Allergies: Allergies  Allergen Reactions   Aspirin Rash and Other (See Comments)    GI distress   Tramadol Other (See Comments)    GI distress   Naproxen     Unknown reaction   Shellfish-Derived Products Itching and Rash    Past Medical History:  Diagnosis Date   Arthritis    "qwhere"   AV block     2:1  January 14, 2011 / pacemaker plan when cellulitis resolved in length   Bradycardia    2:1  AV block   Cellulitis    Superficial cellulitis right lower leg   January 14, 2011   Chronic diastolic CHF (congestive heart failure)  (HCC)    Diabetes mellitus without complication (HCC)    Edema    . EF 65-70%, echo, November 29, 2010   Ejection fraction    EF 65%, echo, January, 2012   Hypertension    Hypokalemia    October, 2012   Murmur    No significant valvular disease by echo, January, 2012   Pacemaker    January, 2013 Medtronic Sensa   Pulmonary hypertension (HCC)     46 mmHg... echo... January, 2012   Past Surgical History:  Procedure Laterality Date   ABDOMINAL AORTOGRAM W/LOWER EXTREMITY Left 12/23/2022   Procedure: ABDOMINAL AORTOGRAM W/LOWER EXTREMITY;  Surgeon: Leonie Douglas,  MD;  Location: [facility] INVASIVE CV LAB;  Service: Cardiovascular;  Laterality: Left;   AMPUTATION Left 12/26/2022   Procedure: AMPUTATION ABOVE KNEE;  Surgeon: Leonie Douglas, MD;  Location: [facility] OR;  Service: Vascular;  Laterality: Left;   APPLICATION OF WOUND VAC Left 12/26/2022   Procedure: APPLICATION OF WOUND VAC;  Surgeon: Leonie Douglas, MD;  Location: [facility] OR;  Service: Vascular;  Laterality: Left;   CHOLECYSTECTOMY     HIP ARTHROPLASTY Right    INSERT / REPLACE / REMOVE PACEMAKER  11/2011   PARTIAL HYSTERECTOMY     vaginal   PERMANENT PACEMAKER INSERTION N/A 12/09/2011   Procedure: PERMANENT PACEMAKER INSERTION;  Surgeon: Marinus Maw, MD;  Location: [facility] CATH LAB;  Service: Cardiovascular;  Laterality: N/A;   PPM GENERATOR CHANGEOUT N/A 01/03/2022   Procedure: PPM GENERATOR CHANGEOUT;  Surgeon: Marinus Maw, MD;  Location: [facility] INVASIVE CV LAB;  Service: Cardiovascular;  Laterality: N/A;   Family History  Problem Relation Age of Onset   Cardiomyopathy Father    Diabetes Father    Heart failure Father    Dementia Mother    Cancer Son    Coronary artery disease Son    Heart attack Neg Hx    Social History   Socioeconomic History   Marital status: Widowed    Spouse name: Not on file   Number of children: Not on file   Years of education: Not on file   Highest education level: Not on file  Occupational History    Occupation: Retired    Associate Professor: RETIRED  Tobacco Use   Smoking status: Never   Smokeless tobacco: Never  Vaping Use   Vaping status: Never Used  Substance and Sexual Activity   Alcohol use: No   Drug use: No   Sexual activity: Never  Other Topics Concern   Not on file  Social History Narrative   Retired    Widowed    Tobacco Use - No.    Alcohol Use - no   Regular Exercise - no   Drug Use - no   Social Determinants of Corporate investment banker Strain: Not on file  Food Insecurity: Not on file  Transportation Needs: Not on file  Physical Activity: Not on file  Stress: Not on file  Social Connections: Not on file  Intimate Partner Violence: Not on file    SUBJECTIVE  Review of Systems - limited due to patient's dementia Cardiovascular: Patient denies chest pain or syncope Respiratory: Patient denies shortness of breath Gastrointestinal: Patient reports constipation Hematologic/Lymphatic: Patient denies bleeding tendencies  GU: As per HPI.  OBJECTIVE Vitals:   09/05/23 0958  BP: (!) 161/82  Pulse: 86   Body mass index is 39.74 kg/m.  Physical Examination Constitutional: No obvious distress; patient is non-toxic appearing  Respiratory: The patient does not have audible wheezing/stridor; respirations do not appear labored. Using oxygen per nasal cannula. Gastrointestinal: Abdomen non-distended Musculoskeletal: Normal ROM of UEs  Skin: No obvious rashes/open sores  Neurologic: CN 2-12 grossly intact Psychiatric: Answered many questions appropriately with normal affect; pleasantly confused at times Hematologic/Lymphatic/Immunologic: No obvious bruises or sites of spontaneous bleeding  UA: son & provider agreed not to obtain UA today given that she is currently asymptomatic for UTI and is both unable to stand or void on demand  ASSESSMENT History of recurrent UTI (urinary tract infection) - Plan: estradiol (ESTRACE) 0.1 MG/GM vaginal cream  Recurrent UTI -  Plan: estradiol (ESTRACE) 0.1 MG/GM vaginal cream  Atrophic vaginitis - Plan: estradiol (ESTRACE) 0.1 MG/GM vaginal cream  Bedbound  Recurrent UTls:  We discussed the possible etiologies of recurrent UTls including ascending infection related to intercourse; vaginal atrophy; transmural infection that has been treated incompletely; urinary tract stones; incomplete bladder emptying with urinary stasis; kidney or bladder tumor; urethral diverticulum; and colonization of  vagina and urinary tract with pathologic, adherent organisms.   For UTI prevention we discussed options including: - Topical vaginal estrogen for vaginal atrophy. The etiology and consequences of urogenital epithelial atrophy was explained to patient. The thinning of the epithelium of the urethra can contribute to urinary urgency and frequency syndromes. In addition, the normal bacterial flora that colonizes the perineum may contribute to UTI risk because the thin urethral epithelium allows the bacteria to become adherent and the change in vaginal pH can disrupt the vaginal / urethral microbiome and allow for bacterial overgrowth. Patient was advised that topical vaginal estrogen replacement will take about 3 months to restore the vaginal pH. - UTI prophylaxis with a daily low dose antibiotic. We discussed the potential risks of prolonged antibiotic treatment particularly with the risks of developing antibiotic resistance.  - OK to continue cranberry supplement  Mrs. Manson Passey and her son ultimately decided to start with topical vaginal estrogen cream use  nightly (to be administered by staff at her SNF). If recurrent UTls persist, may consider UTI prophylaxis with a daily low dose antibiotic in the future.  If/when UTI-like symptoms occur she will require I&O catheter to obtain sterile urine specimen for urinalysis and reflex urine culture. That can be done by staff at her SNF. Requested to fax all urinalysis and urine culture results to  Urology for review. Her providers at [facility] are welcome to treat with antibiotics as indicated however Urology can manage that if preferred.   Will plan for follow up in 3 months or sooner if needed. Pt and son verbalized understanding and agreement. All questions were answered.   PLAN Advised the following: 1. Start topical vaginal estrogen cream use every other night as prescribed (to be performed by staff at [facility]). 2. Continue cranberry supplement for UTI prevention. 3. Continue constipation management with Miralax or as per patient's provider(s) at SNF. 4. Maintain adequate fluid intake daily.  5. Continue routine diaper changes to reduce bacterial load near urethra and to minimize risk for skin breakdown. 6. If/when UTI-like symptoms occur: SNF staff requested to perform I&O catheter to obtain sterile urine specimen for urinalysis and reflex urine culture. Please fax all urinalysis and urine culture results to Urology for review. Patient's providers at [facility] are welcome to treat with antibiotics as indicated however Urology can manage that if preferred (please notify our office).  7. Return in about 3 months (around 12/06/2023) for f/u with [name], NP.  No orders of the defined types were placed in this encounter.   It has been explained that the patient is to follow regularly with their PCP in addition to all other providers involved in their care and to follow instructions provided by these respective offices. Patient advised to contact urology clinic if any urologic-pertaining questions, concerns, new symptoms or problems arise in the interim period.  There are no Patient Instructions on file for this visit.  Electronically signed by:  Donnita Falls, MSN, FNP-C, CUNP 09/05/2023 10:53 AM

## 2023-08-31 ENCOUNTER — Encounter: Payer: Self-pay | Admitting: Gastroenterology

## 2023-08-31 ENCOUNTER — Ambulatory Visit: Payer: Medicare Other | Admitting: Gastroenterology

## 2023-08-31 VITALS — BP 117/63 | HR 78 | Temp 98.2°F | Ht <= 58 in | Wt 178.5 lb

## 2023-08-31 DIAGNOSIS — R195 Other fecal abnormalities: Secondary | ICD-10-CM | POA: Diagnosis not present

## 2023-08-31 DIAGNOSIS — D649 Anemia, unspecified: Secondary | ICD-10-CM

## 2023-08-31 NOTE — Patient Instructions (Signed)
I recommend an iron panel, B12, folate.  She can have this blood work completed at your facility.  I recommend that the physician at your facility monitor your hemoglobin every 3 months.  They can stretch this out to every 6 months after stability has been established over several months.  Continue iron daily.  Continue pantoprazole daily.  Will follow-up with you in the office as needed.  Recommend reconsultation if significant decline in hemoglobin or obvious rectal bleeding or black stools.  Ermalinda Memos, PA-C [facility]

## 2023-09-01 ENCOUNTER — Other Ambulatory Visit (HOSPITAL_COMMUNITY): Payer: Self-pay | Admitting: Nephrology

## 2023-09-01 DIAGNOSIS — E1122 Type 2 diabetes mellitus with diabetic chronic kidney disease: Secondary | ICD-10-CM

## 2023-09-01 DIAGNOSIS — N1832 Chronic kidney disease, stage 3b: Secondary | ICD-10-CM

## 2023-09-05 ENCOUNTER — Ambulatory Visit (INDEPENDENT_AMBULATORY_CARE_PROVIDER_SITE_OTHER): Payer: Medicare Other | Admitting: Urology

## 2023-09-05 ENCOUNTER — Encounter: Payer: Self-pay | Admitting: Urology

## 2023-09-05 VITALS — BP 161/82 | HR 86 | Temp 97.9°F | Ht <= 58 in | Wt 171.0 lb

## 2023-09-05 DIAGNOSIS — Z87440 Personal history of urinary (tract) infections: Secondary | ICD-10-CM | POA: Diagnosis not present

## 2023-09-05 DIAGNOSIS — N390 Urinary tract infection, site not specified: Secondary | ICD-10-CM

## 2023-09-05 DIAGNOSIS — N952 Postmenopausal atrophic vaginitis: Secondary | ICD-10-CM

## 2023-09-05 DIAGNOSIS — Z7401 Bed confinement status: Secondary | ICD-10-CM | POA: Diagnosis not present

## 2023-09-05 MED ORDER — ESTRADIOL 0.1 MG/GM VA CREA: TOPICAL_CREAM | VAGINAL | 5 refills | Status: DC

## 2023-09-21 ENCOUNTER — Ambulatory Visit (HOSPITAL_COMMUNITY)
Admission: RE | Admit: 2023-09-21 | Discharge: 2023-09-21 | Disposition: A | Discharge status: Home / Self Care | Payer: Medicare Other | Source: Ambulatory Visit | Attending: Nephrology | Admitting: Nephrology

## 2023-09-21 DIAGNOSIS — E1122 Type 2 diabetes mellitus with diabetic chronic kidney disease: Secondary | ICD-10-CM | POA: Diagnosis present

## 2023-09-21 DIAGNOSIS — N1832 Chronic kidney disease, stage 3b: Secondary | ICD-10-CM | POA: Insufficient documentation

## 2023-10-09 ENCOUNTER — Ambulatory Visit (INDEPENDENT_AMBULATORY_CARE_PROVIDER_SITE_OTHER): Payer: Medicare Other

## 2023-10-09 DIAGNOSIS — I442 Atrioventricular block, complete: Secondary | ICD-10-CM | POA: Diagnosis not present

## 2023-10-10 LAB — CUP PACEART REMOTE DEVICE CHECK
Battery Remaining Longevity: 120 mo
Battery Voltage: 3.02 V
Brady Statistic AP VP Percent: 22.06 %
Brady Statistic AP VS Percent: 0 %
Brady Statistic AS VP Percent: 77.9 %
Brady Statistic AS VS Percent: 0.04 %
Brady Statistic RA Percent Paced: 22.02 %
Brady Statistic RV Percent Paced: 99.96 %
Date Time Interrogation Session: 20241118032335
Implantable Lead Connection Status: 753985
Implantable Lead Connection Status: 753985
Implantable Lead Implant Date: 20130118
Implantable Lead Implant Date: 20130118
Implantable Lead Location: 753859
Implantable Lead Location: 753860
Implantable Lead Model: 5076
Implantable Lead Model: 5076
Implantable Pulse Generator Implant Date: 20230213
Lead Channel Impedance Value: 285 Ohm
Lead Channel Impedance Value: 361 Ohm
Lead Channel Impedance Value: 380 Ohm
Lead Channel Impedance Value: 437 Ohm
Lead Channel Pacing Threshold Amplitude: 0.875 V
Lead Channel Pacing Threshold Amplitude: 1.375 V
Lead Channel Pacing Threshold Pulse Width: 0.4 ms
Lead Channel Pacing Threshold Pulse Width: 0.4 ms
Lead Channel Sensing Intrinsic Amplitude: 0.5 mV
Lead Channel Sensing Intrinsic Amplitude: 0.5 mV
Lead Channel Sensing Intrinsic Amplitude: 3.375 mV
Lead Channel Sensing Intrinsic Amplitude: 3.375 mV
Lead Channel Setting Pacing Amplitude: 2 V
Lead Channel Setting Pacing Amplitude: 2.75 V
Lead Channel Setting Pacing Pulse Width: 0.4 ms
Lead Channel Setting Sensing Sensitivity: 2.8 mV
Zone Setting Status: 755011
Zone Setting Status: 755011

## 2023-10-12 ENCOUNTER — Other Ambulatory Visit: Payer: Self-pay

## 2023-10-12 ENCOUNTER — Emergency Department (HOSPITAL_COMMUNITY)
Admission: EM | Admit: 2023-10-12 | Discharge: 2023-10-13 | Disposition: A | Discharge status: Home / Self Care | Payer: Medicare Other | Attending: Emergency Medicine | Admitting: Emergency Medicine

## 2023-10-12 ENCOUNTER — Encounter (HOSPITAL_COMMUNITY): Payer: Self-pay | Admitting: Radiology

## 2023-10-12 DIAGNOSIS — I5030 Unspecified diastolic (congestive) heart failure: Secondary | ICD-10-CM | POA: Insufficient documentation

## 2023-10-12 DIAGNOSIS — E119 Type 2 diabetes mellitus without complications: Secondary | ICD-10-CM | POA: Diagnosis not present

## 2023-10-12 DIAGNOSIS — M542 Cervicalgia: Secondary | ICD-10-CM | POA: Insufficient documentation

## 2023-10-12 DIAGNOSIS — I110 Hypertensive heart disease with heart failure: Secondary | ICD-10-CM | POA: Insufficient documentation

## 2023-10-12 DIAGNOSIS — Z79899 Other long term (current) drug therapy: Secondary | ICD-10-CM | POA: Diagnosis not present

## 2023-10-12 DIAGNOSIS — Z950 Presence of cardiac pacemaker: Secondary | ICD-10-CM | POA: Insufficient documentation

## 2023-10-12 NOTE — ED Triage Notes (Signed)
Pt from [facility] with complaint of left neck pain that started last night suddenly. Pt states she was just sitting when the pain began and it travels down in her arm pit. Movement makes the pain worse.

## 2023-10-13 ENCOUNTER — Emergency Department (HOSPITAL_COMMUNITY): Payer: Medicare Other

## 2023-10-13 DIAGNOSIS — M542 Cervicalgia: Secondary | ICD-10-CM | POA: Diagnosis not present

## 2023-10-13 MED ORDER — CYCLOBENZAPRINE HCL 5 MG PO TABS: 5.0000 mg | ORAL_TABLET | Freq: Three times a day (TID) | ORAL | 0 refills | Status: AC | PRN

## 2023-10-13 MED ORDER — ACETAMINOPHEN 325 MG PO TABS: 650.0000 mg | ORAL_TABLET | Freq: Four times a day (QID) | ORAL | 0 refills | Status: DC | PRN

## 2023-10-13 NOTE — Discharge Instructions (Signed)
Apply ice as needed.  Ice should be applied for 30 minutes at a time, up to 4 times a day.

## 2023-10-13 NOTE — ED Provider Notes (Addendum)
[facility] EMERGENCY DEPARTMENT AT [place] [facility] Provider Note   CSN: [id number] Arrival date & time: 10/12/23  2052     History  Chief Complaint  Patient presents with   Neck Pain    [name] is a 89 y.o. female.  The history is provided by the patient.  Neck Pain She has history of hypertension, diabetes, diastolic heart failure, pulmonary hypertension, heart block status post pacemaker insertion and was sent here from [facility] where she was complaining of pain in the left side of her neck.  She is an extremely poor historian and I am not able to get any clear picture as far as when the pain started.  It is reported in nursing notes the pain is worse with movement, and also report that pain radiated to her left axilla.  There is no complaint of weakness or numbness.  There is no history of trauma.  She does state that while she has been waiting in the emergency department, pain has improved but is still present.   Home Medications Prior to Admission medications   Medication Sig Start Date End Date Taking? Authorizing Provider  albuterol (VENTOLIN HFA) 108 (90 Base) MCG/ACT inhaler Inhale 2 puffs into the lungs every 6 (six) hours as needed for wheezing or shortness of breath.    [provider]  Amino Acids-Protein Hydrolys (PRO-STAT AWC) LIQD Take 30 mLs by mouth daily.    [provider]  ammonium lactate (AMLACTIN) 12 % cream Apply 1 Application topically in the morning and at bedtime.    [provider]  ascorbic acid (VITAMIN C) 500 MG tablet Take 500 mg by mouth 2 (two) times daily.    [provider]  Biotin 5000 MCG CAPS Take 5,000 mcg by mouth daily. (0900)    [provider]  carvedilol (COREG) 6.25 MG tablet Take 6.25 mg by mouth 2 (two) times daily with a meal. (0900 & 1700)    [provider]  cetirizine (ZYRTEC) 10 MG tablet Take 10 mg by mouth daily.    [provider]  Cholecalciferol  (VITAMIN D) 50 MCG (2000 UT) CAPS Take 2,000 Units by mouth in the morning. (0900)    [provider]  Cranberry 360 MG CAPS Take by mouth.    [provider]  cyclobenzaprine (FLEXERIL) 5 MG tablet Take 5 mg by mouth 3 (three) times daily as needed for muscle spasms.    [provider]  estradiol (ESTRACE) 0.1 MG/GM vaginal cream Discard plastic applicator. Insert a blueberry size amount (approximately 1 gram) of cream on gloved fingertip inside vagina at bedtime every night. For long term use. 09/05/23   Donnita Falls, FNP  furosemide (LASIX) 80 MG tablet Take 80 mg by mouth in the morning. (0900) 09/19/17   [provider]  gabapentin (NEURONTIN) 400 MG capsule Take 1 capsule (400 mg total) by mouth 2 (two) times daily. 12/31/22 08/31/23  Dorcas Carrow, MD  guaiFENesin-dextromethorphan (ROBITUSSIN DM) 100-10 MG/5ML syrup Take 5 mLs by mouth every 4 (four) hours as needed for cough.    [provider]  Magnesium 250 MG TABS Take 1 tablet by mouth daily.    [provider]  melatonin 3 MG TABS tablet Take 3 mg by mouth at bedtime. (2100)    [provider]  Menthol, Topical Analgesic, (BIOFREEZE COLORLESS EX) Apply 1 application  topically in the morning, at noon, and at bedtime. for neck pain    [provider]  metolazone (ZAROXOLYN) 2.5 MG tablet Take 2.5 mg by mouth every other day.    [provider]  Multiple Vitamins-Minerals (CERTA PLUS) TABS Take by mouth.    [provider]  nitroGLYCERIN (NITROSTAT) 0.4 MG SL tablet Place 0.4 mg under the tongue every 5 (five) minutes as needed for chest pain (X 3 DOSES FOR CHEST PAIN).    [provider]  oxyCODONE (OXY IR/ROXICODONE) 5 MG immediate release tablet Take 1 tablet (5 mg total) by mouth every 4 (four) hours as needed for moderate pain or severe pain (1 tablet for pain 4-6, 2 tablet for pain 7-10). 12/31/22   Dorcas Carrow, MD  pantoprazole  (PROTONIX) 40 MG tablet Take 40 mg by mouth daily.    [provider]  polyethylene glycol powder (GLYCOLAX/MIRALAX) powder Take 17 g by mouth daily. (0900)    [provider]  potassium chloride (KLOR-CON) 10 MEQ tablet Take 30 mEq by mouth 2 (two) times daily. (0900 & 2100) 11/19/19   [provider]  rosuvastatin (CRESTOR) 40 MG tablet Take 40 mg by mouth daily.    [provider]  sitaGLIPtin (JANUVIA) 50 MG tablet Take 50 mg by mouth in the morning. (0900)    [provider]  sucralfate (CARAFATE) 1 g tablet Take 1 g by mouth 4 (four) times daily -  with meals and at bedtime.    [provider]      Allergies    Aspirin, Tramadol, Naproxen, and Shellfish-derived products    Review of Systems   Review of Systems  Musculoskeletal:  Positive for neck pain.  All other systems reviewed and are negative.   Physical Exam Updated Vital Signs BP 119/69   Pulse 66   Temp 98 F (36.7 C) (Oral)   Resp 16   Ht 4\' 7"  (1.397 m)   Wt 79.4 kg   SpO2 100%   BMI 40.67 kg/m  Physical Exam Vitals and nursing note reviewed.   89 year old female, resting comfortably and in no acute distress. Vital signs are normal. Oxygen saturation is 100%, which is normal. Head is normocephalic and atraumatic. PERRLA, EOMI. Oropharynx is clear. Neck is nontender.  There is some pain with passive flexion. Back is nontender Lungs are clear without rales, wheezes, or rhonchi. Chest is nontender. Heart has regular rate and rhythm without murmur. Abdomen is soft, flat, nontender. Extremities have no cyanosis or edema. Skin is warm and dry without rash. Neurologic: Awake and alert, moves all extremities equally, no sensory deficits.  ED Results / Procedures / Treatments    Radiology CT Cervical Spine Wo Contrast  Result Date: 10/13/2023 CLINICAL DATA:  Left neck pain EXAM: CT CERVICAL SPINE WITHOUT CONTRAST TECHNIQUE: Multidetector CT imaging of the  cervical spine was performed without intravenous contrast. Multiplanar CT image reconstructions were also generated. RADIATION DOSE REDUCTION: This exam was performed according to the departmental dose-optimization program which includes automated exposure control, adjustment of the mA and/or kV according to patient size and/or use of iterative reconstruction technique. COMPARISON:  None Available. FINDINGS: Alignment: No subluxation. Skull base and vertebrae: No acute fracture. Lucent areas throughout the vertebral bodies diffusely, similar to prior study, presumably related to osteopenia. Soft tissues and spinal canal: No prevertebral fluid or swelling. No visible canal hematoma. Disc levels: Advanced diffuse degenerative disc disease with disc space narrowing and spurring. Endplate sclerosis. Advanced diffuse degenerative facet disease. Multilevel bilateral neural foraminal narrowing, right greater than left due to facet  hypertrophy and uncovertebral spurring. Upper chest: No acute findings Other: None IMPRESSION: Advanced diffuse degenerative disc disease and facet disease. Multilevel bilateral neural foraminal narrowing, right greater than left. Numerous rounded lucencies throughout the vertebral bodies, similar to prior study. Given the stability, this is presumably related to osteopenia. No acute bony abnormality. Electronically Signed   By: Charlett Nose M.D.   On: 10/13/2023 03:57    Procedures Procedures    Medications Ordered in ED Medications - No data to display  ED Course/ Medical Decision Making/ A&P                                 Medical Decision Making Amount and/or Complexity of Data Reviewed Radiology: ordered.  Risk OTC drugs. Prescription drug management.   Neck pain with relatively benign exam.  I suspect this is primarily muscular, no red flags to suggest more serious pathology.  I have ordered CT of cervical spine to look for occult fracture.  CT shows degenerative  changes.  While waiting for results, patient is resting comfortably and indeed has fallen asleep.  I am discharging her with prescription for cyclobenzaprine to use as needed, acetaminophen to use as needed.  I have recommended that she apply ice.  Return if she has new or concerning symptoms.  Final Clinical Impression(s) / ED Diagnoses Final diagnoses:  Neck pain    Rx / DC Orders ED Discharge Orders          Ordered    cyclobenzaprine (FLEXERIL) 5 MG tablet  3 times daily PRN        10/13/23 0417    acetaminophen (TYLENOL) 325 MG tablet  Every 6 hours PRN        10/13/23 0417              Dione Booze, MD 10/13/23 1610    Dione Booze, MD 10/13/23 [id number]

## 2023-10-26 ENCOUNTER — Ambulatory Visit (HOSPITAL_BASED_OUTPATIENT_CLINIC_OR_DEPARTMENT_OTHER): Payer: Medicare Other | Admitting: Internal Medicine

## 2023-10-27 ENCOUNTER — Telehealth: Payer: Self-pay | Admitting: Internal Medicine

## 2023-10-27 NOTE — Telephone Encounter (Signed)
Dtr made aware that from a PPM standpoint, there is no restrictions for taking any medication/s.  Reminded this is only advisement r/t to the PPM they are asking about. Advised that she may have restrictions from certain things from a heart failure/plumbing side of things, but she should be able to take pain medications if needed/prescribed. Dtr appreciates the return call

## 2023-10-27 NOTE — Telephone Encounter (Signed)
Pt c/o medication issue:  1. Name of Medication:   Pain medication  2. How are you currently taking this medication (dosage and times per day)?   3. Are you having a reaction (difficulty breathing--STAT)?   4. What is your medication issue?   Daughter [name]) stated patient had orthopedic appointment and needs to get pain medication.  Daughter wants to know what kind of pain medication she can take for pain as she has a pacemaker.

## 2023-11-01 NOTE — Progress Notes (Signed)
Remote pacemaker transmission.   

## 2023-11-21 ENCOUNTER — Telehealth: Payer: Self-pay

## 2023-11-21 NOTE — Telephone Encounter (Signed)
   Pre-operative Risk Assessment    Patient Name: [name]  DOB: Nov 08, 1931 MRN: [id number]   Date of last office visit: 05/02/23 Date of next office visit: 01/03/24  Request for Surgical Clearance    Procedure:  Dental Extraction - Amount of Teeth to be Pulled:  1  Date of Surgery:  Clearance 12/08/23                                Surgeon:  Fonda Finder, MD Surgeon's Group or Practice Name:  [facility] Phone number:  [phone] Fax number:  [phone]   Type of Clearance Requested:   - Medical    Type of Anesthesia:  Local    Additional requests/questions:    Bonney Ival LOISE Gerome   11/21/2023, 4:53 PM

## 2023-11-23 NOTE — Telephone Encounter (Signed)
   Primary Cardiologist: Candyce Reek, MD  Chart reviewed as part of pre-operative protocol coverage. Simple dental extractions (1-2 teeth) are considered low risk procedures per guidelines and generally do not require any specific cardiac clearance. It is also generally accepted that for simple extractions and dental cleanings, there is no need to interrupt blood thinner therapy.   SBE prophylaxis is not required for the patient.  I will route this recommendation to the requesting party via Epic fax function and remove from pre-op pool.  Please call with questions.  Rosaline EMERSON Bane, NP-C 11/23/2023, 10:53 AM [address], [address] Office [phone] Fax [phone]

## 2023-12-06 NOTE — Progress Notes (Signed)
Name: [name] DOB: 01-13-1931 MRN: [id number]  History of Present Illness: [name] is a 89 y.o. female who presents today for follow up visit at [facility]. She resides at [facility] and is accompanied by her son [name], who assisted with giving history due to patient's cognitive communication deficit (she is a poor historian).  Urine culture results in past 12 months: - 05/30/2023: Positive for Enterococcus cloacae - Per 08/14/2023 note by Baker Pierini NP: Recent urine culture positive for Klebsiella oxytoca and Klebsiella pneumoniae; treated with Bactrim.  Recent relevant imaging: - 08/01/2023: RUS performed however the study quality was significantly limited / degraded by patient body habitus. The right kidney and bladder were not visualized. Very limited visualization of the left kidney was unremarkable.  At initial visit on 09/05/2023: - Seen for suspected recurrent UTIs.   - Son reported she had 3+ UTl's in the past year. When present, UTI symptoms include dysuria, increased confusion, increased fatigue. - Baseline: Urinary frequency likely due to Lasix use. Denies caffeine intake. She voids primarily via urinary incontinence with sensory awareness into diapers. She is bedbound. Patient states is unable to tell whether or not she is fully emptying her bladder. - Denies history of pyelonephritis or kidney stones. - Taking a cranberry supplement for UTI prevention. - Taking Miralax for chronic constipation (possibly opioid-induced).  - UA: "son & provider agreed not to obtain UA today given that she is currently asymptomatic for UTI and is both unable to stand or void on demand".  - Advised the following: 1. Topical vaginal estrogen cream use every other night (to be applied by SNF staff). 2. Continue cranberry supplement for UTI prevention. 3. Continue constipation management. 4. Maintain adequate fluid intake daily.  5. Continue routine diaper  changes to reduce bacterial load near urethra and to minimize risk for skin breakdown. 6. If/when UTI-like symptoms occur: SNF staff to obtain urine specimen via I&O cath for culture.  7. Return in about 3 months (around 12/06/2023) for f/u with [name], NP.  Today: She denies any symptomatic UTIs since last visit. Per med rec list from SNF she is taking a daily cranberry supplement and topical vaginal estrogen cream is being applied 3 days per week. There is a standing order at SNF for I&O cath PRN if UTI suspected.  Today she denies dysuria, gross hematuria, fevers. She states her constipation is well managed at this time.   Fall Screening: Do you usually have a device to assist in your mobility? Yes   Medications: Current Outpatient Medications  Medication Sig Dispense Refill   acetaminophen (TYLENOL) 325 MG tablet Take 2 tablets (650 mg total) by mouth every 6 (six) hours as needed for mild pain (pain score 1-3). 40 tablet 0   albuterol (VENTOLIN HFA) 108 (90 Base) MCG/ACT inhaler Inhale 2 puffs into the lungs every 6 (six) hours as needed for wheezing or shortness of breath.     Amino Acids-Protein Hydrolys (PRO-STAT AWC) LIQD Take 30 mLs by mouth daily.     ammonium lactate (AMLACTIN) 12 % cream Apply 1 Application topically in the morning and at bedtime.     ascorbic acid (VITAMIN C) 500 MG tablet Take 500 mg by mouth 2 (two) times daily.     Biotin 5000 MCG CAPS Take 5,000 mcg by mouth daily. (0900)     carvedilol (COREG) 6.25 MG tablet Take 6.25 mg by mouth 2 (two) times daily with a meal. (0900 & 1700)  cetirizine (ZYRTEC) 10 MG tablet Take 10 mg by mouth daily.     Cholecalciferol (VITAMIN D) 50 MCG (2000 UT) CAPS Take 2,000 Units by mouth in the morning. (0900)     Cranberry 360 MG CAPS Take by mouth.     cyclobenzaprine (FLEXERIL) 5 MG tablet Take 1 tablet (5 mg total) by mouth 3 (three) times daily as needed for muscle spasms. 30 tablet 0   estradiol (ESTRACE) 0.1  MG/GM vaginal cream Discard plastic applicator. Insert a blueberry size amount (approximately 1 gram) of cream on gloved fingertip inside vagina at bedtime every night. For long term use. 42.5 g 5   furosemide (LASIX) 80 MG tablet Take 80 mg by mouth in the morning. (0900)     guaiFENesin-dextromethorphan (ROBITUSSIN DM) 100-10 MG/5ML syrup Take 5 mLs by mouth every 4 (four) hours as needed for cough.     Magnesium 250 MG TABS Take 1 tablet by mouth daily.     melatonin 3 MG TABS tablet Take 3 mg by mouth at bedtime. (2100)     Menthol, Topical Analgesic, (BIOFREEZE COLORLESS EX) Apply 1 application  topically in the morning, at noon, and at bedtime. for neck pain     metolazone (ZAROXOLYN) 2.5 MG tablet Take 2.5 mg by mouth every other day.     Multiple Vitamins-Minerals (CERTA PLUS) TABS Take by mouth.     nitroGLYCERIN (NITROSTAT) 0.4 MG SL tablet Place 0.4 mg under the tongue every 5 (five) minutes as needed for chest pain (X 3 DOSES FOR CHEST PAIN).     oxyCODONE (OXY IR/ROXICODONE) 5 MG immediate release tablet Take 1 tablet (5 mg total) by mouth every 4 (four) hours as needed for moderate pain or severe pain (1 tablet for pain 4-6, 2 tablet for pain 7-10). 20 tablet 0   pantoprazole (PROTONIX) 40 MG tablet Take 40 mg by mouth daily.     polyethylene glycol powder (GLYCOLAX/MIRALAX) powder Take 17 g by mouth daily. (0900)     potassium chloride (KLOR-CON) 10 MEQ tablet Take 30 mEq by mouth 2 (two) times daily. (0900 & 2100)     rosuvastatin (CRESTOR) 40 MG tablet Take 40 mg by mouth daily.     sitaGLIPtin (JANUVIA) 50 MG tablet Take 50 mg by mouth in the morning. (0900)     sucralfate (CARAFATE) 1 g tablet Take 1 g by mouth 4 (four) times daily -  with meals and at bedtime.     gabapentin (NEURONTIN) 400 MG capsule Take 1 capsule (400 mg total) by mouth 2 (two) times daily. 60 capsule 0   No current facility-administered medications for this visit.    Allergies: Allergies  Allergen  Reactions   Aspirin Rash and Other (See Comments)    GI distress   Tramadol Other (See Comments)    GI distress   Naproxen     Unknown reaction   Shellfish-Derived Products Itching and Rash    Past Medical History:  Diagnosis Date   Arthritis    "qwhere"   AV block     2:1  January 14, 2011 / pacemaker plan when cellulitis resolved in length   Bradycardia    2:1  AV block   Cellulitis    Superficial cellulitis right lower leg   January 14, 2011   Chronic diastolic CHF (congestive heart failure) (HCC)    Diabetes mellitus without complication (HCC)    Edema    . EF 65-70%, echo, November 29, 2010   Ejection fraction  EF 65%, echo, January, 2012   Hypertension    Hypokalemia    October, 2012   Murmur    No significant valvular disease by echo, January, 2012   Pacemaker    January, 2013 Medtronic Sensa   Pulmonary hypertension (HCC)     46 mmHg... echo... January, 2012   Past Surgical History:  Procedure Laterality Date   ABDOMINAL AORTOGRAM W/LOWER EXTREMITY Left 12/23/2022   Procedure: ABDOMINAL AORTOGRAM W/LOWER EXTREMITY;  Surgeon: Leonie Douglas, MD;  Location: [facility] INVASIVE CV LAB;  Service: Cardiovascular;  Laterality: Left;   AMPUTATION Left 12/26/2022   Procedure: AMPUTATION ABOVE KNEE;  Surgeon: Leonie Douglas, MD;  Location: [facility] OR;  Service: Vascular;  Laterality: Left;   APPLICATION OF WOUND VAC Left 12/26/2022   Procedure: APPLICATION OF WOUND VAC;  Surgeon: Leonie Douglas, MD;  Location: [facility] OR;  Service: Vascular;  Laterality: Left;   CHOLECYSTECTOMY     HIP ARTHROPLASTY Right    INSERT / REPLACE / REMOVE PACEMAKER  11/2011   PARTIAL HYSTERECTOMY     vaginal   PERMANENT PACEMAKER INSERTION N/A 12/09/2011   Procedure: PERMANENT PACEMAKER INSERTION;  Surgeon: Marinus Maw, MD;  Location: [facility] CATH LAB;  Service: Cardiovascular;  Laterality: N/A;   PPM GENERATOR CHANGEOUT N/A 01/03/2022   Procedure: PPM GENERATOR CHANGEOUT;  Surgeon: Marinus Maw, MD;   Location: [facility] INVASIVE CV LAB;  Service: Cardiovascular;  Laterality: N/A;   Family History  Problem Relation Age of Onset   Cardiomyopathy Father    Diabetes Father    Heart failure Father    Dementia Mother    Cancer Son    Coronary artery disease Son    Heart attack Neg Hx    Social History   Socioeconomic History   Marital status: Widowed    Spouse name: Not on file   Number of children: Not on file   Years of education: Not on file   Highest education level: Not on file  Occupational History   Occupation: Retired    Associate Professor: RETIRED  Tobacco Use   Smoking status: Never   Smokeless tobacco: Never  Vaping Use   Vaping status: Never Used  Substance and Sexual Activity   Alcohol use: No   Drug use: No   Sexual activity: Never  Other Topics Concern   Not on file  Social History Narrative   Retired    Widowed    Tobacco Use - No.    Alcohol Use - no   Regular Exercise - no   Drug Use - no   Social Drivers of Corporate investment banker Strain: Not on file  Food Insecurity: Not on file  Transportation Needs: Not on file  Physical Activity: Not on file  Stress: Not on file  Social Connections: Not on file  Intimate Partner Violence: Not on file    Review of Systems limited by patient cognitive status Constitutional: Patient denies acute change in strength Cardiovascular: Patient denies chest pain or syncope Respiratory: Patient denies shortness of breath Gastrointestinal: As per HPI  GU: As per HPI.  OBJECTIVE Vitals:   12/13/23 1132  BP: (!) 184/71  Pulse: 77  Temp: 98.3 F (36.8 C)   There is no height or weight on file to calculate BMI.  Physical Examination Constitutional: No obvious distress; patient is non-toxic appearing  Cardiovascular: No visible lower extremity edema.  Respiratory: The patient does not have audible wheezing/stridor; respirations do not appear labored  Gastrointestinal: Abdomen  non-distended Musculoskeletal: Normal ROM  of UEs  Skin: No obvious rashes/open sores  Neurologic: CN 2-12 grossly intact Psychiatric: Answered questions to the best of her ability with normal affect  Hematologic/Lymphatic/Immunologic: No obvious bruises or sites of spontaneous bleeding   ASSESSMENT Recurrent UTI  Atrophic vaginitis  Bedbound  No acute findings; UTI prevention seems to be going well on current treatment regimen. Advised to continue topical vaginal estrogen cream use, cranberry supplement, constipation management, routine genital cleansing, and I&O cath PRN for urine specimen if/when acute UTI suspected. Will plan for follow up in 1 year or sooner if needed. Pt verbalized understanding and agreement. All questions were answered.  PLAN Advised the following: 1. Continue topical vaginal estrogen cream use every 3-4 nights per week as prescribed (to be performed by staff at [facility]). 2. Continue cranberry supplement for UTI prevention. 3. Continue constipation management with Miralax or as per patient's provider(s) at SNF. 4. Maintain adequate fluid intake daily.  5. Continue routine diaper changes to reduce bacterial load near urethra and to minimize risk for skin breakdown. 6. If/when UTI-like symptoms occur: SNF staff requested to perform I&O catheter to obtain sterile urine specimen for urinalysis and reflex urine culture. Please fax all urinalysis and urine culture results to Urology for review. Patient's providers at [facility] are welcome to treat with antibiotics as indicated however Urology can manage that if preferred (please notify our office).  7. Return in about 1 year (around 12/12/2024) for f/u with [name], NP.  No orders of the defined types were placed in this encounter.   It has been explained that the patient is to follow regularly with their PCP in addition to all other providers involved in their care and to follow instructions provided by these respective offices. Patient  advised to contact urology clinic if any urologic-pertaining questions, concerns, new symptoms or problems arise in the interim period.  There are no Patient Instructions on file for this visit.  Electronically signed by:  Donnita Falls, FNP   12/13/23    11:33 AM

## 2023-12-13 ENCOUNTER — Ambulatory Visit: Payer: Medicare Other | Admitting: Urology

## 2023-12-13 ENCOUNTER — Encounter: Payer: Self-pay | Admitting: Urology

## 2023-12-13 VITALS — BP 184/71 | HR 77 | Temp 98.3°F

## 2023-12-13 DIAGNOSIS — Z7401 Bed confinement status: Secondary | ICD-10-CM

## 2023-12-13 DIAGNOSIS — N952 Postmenopausal atrophic vaginitis: Secondary | ICD-10-CM | POA: Diagnosis not present

## 2023-12-13 DIAGNOSIS — Z87440 Personal history of urinary (tract) infections: Secondary | ICD-10-CM

## 2023-12-13 DIAGNOSIS — N390 Urinary tract infection, site not specified: Secondary | ICD-10-CM

## 2023-12-18 ENCOUNTER — Telehealth: Payer: Self-pay | Admitting: Neurology

## 2023-12-18 NOTE — Telephone Encounter (Signed)
Pt daughter [name](on DPR) would like a call to discuss what exactly will be happening in the appt on 12/20/2023. Had some questions about memory testing and what it entails.

## 2023-12-18 NOTE — Telephone Encounter (Signed)
I spoke with the patient's daughter, [name] (per [place]). I explained that during a first visit for memory issues the patient will have vitals taken, medications reconciled, allergies reviewed, and one of two memory test preformed. The provider may not provide a diagnosis at first visit. The provider may order further testing such as imaging and lab work which will be reviewed with the patient at a later date.  She mentioned her mother had been complaining of neck pain. I advised that we did not have a referral for neck pain and she should follow up with her PCP. She stated they had followed up with PCP who was to refer the patient. I let her know I would check referrals however typically providers only see one issue at a time. She verbalized understanding and expressed appreciation for the call.

## 2023-12-19 ENCOUNTER — Encounter: Payer: Self-pay | Admitting: Neurology

## 2023-12-20 ENCOUNTER — Encounter: Payer: Self-pay | Admitting: Neurology

## 2023-12-20 ENCOUNTER — Ambulatory Visit (INDEPENDENT_AMBULATORY_CARE_PROVIDER_SITE_OTHER): Payer: Medicare Other | Admitting: Neurology

## 2023-12-20 VITALS — BP 137/70 | HR 79 | Ht 59.0 in

## 2023-12-20 DIAGNOSIS — R4189 Other symptoms and signs involving cognitive functions and awareness: Secondary | ICD-10-CM | POA: Diagnosis not present

## 2023-12-20 DIAGNOSIS — R413 Other amnesia: Secondary | ICD-10-CM

## 2023-12-20 NOTE — Progress Notes (Signed)
[facility]  PATIENT: [name] DOB: Mar 29, 1931  REQUESTING CLINICIAN: Dennard Nip, NP HISTORY FROM: Patient and son  REASON FOR VISIT: Memory loss    HISTORICAL  CHIEF COMPLAINT:  Chief Complaint  Patient presents with   New Patient (Initial Visit)    Pt in 13, here with son [name]  Pt is referred by Yisroel Ramming for memory impairment.     HISTORY OF PRESENT ILLNESS:  This is a 89 year old woman with multiple medical conditions including hypertension, hyperlipidemia, diabetes mellitus, PAD, lung disease on supplemental oxygen, gout, recurrent UTIs , who is presenting with memory problem.  Patient tells me that she is 89 years old and her memory is not what it used to be, sometimes she is  forgetful but otherwise she felt like she is doing okay.  Son tell me that patient can be repetitive, forgetful but overall she is okay.  She is living at the [facility] nursing home for the past 8 years due to physical limitation.  Last year she did have her left leg amputated due to peripheral vascular disease. Patient also has a history of recurrent UTI.  With each of her infection she will have episode of confusion, son tells me that basically at that time she will say statement of false, there are confusion and delusions but once the infection is clear, she go back to her normal self. Today she was able to give me a very good history which was verified by her son.   TBI:  No past history of TBI Stroke:   no past history of stroke Seizures:  no past history of seizures Sleep:  no history of sleep apnea.  Mood: patient denies anxiety and depression Family history of Dementia: Mother, 2 sisters  Functional status: Dependent in most ADLs due to physical limitation  Patient lives [facility]. Cooking: no Cleaning: no Shopping: no Bathing: with help Toileting: with help Driving: no Bills: no Medications: nurse  Ever left the stove on by  accident?: n/a Forget how to use items around the house?: n/a Getting lost going to familiar places?: denies Forgetting loved ones names?: denies Word finding difficulty? denies Sleep: good    OTHER MEDICAL CONDITIONS: Hypertension, Hyperlipidemia, DMII, PAD, Gout, Lung disease on supplemental oxygen    REVIEW OF SYSTEMS: Full 14 system review of systems performed and negative with exception of: As noted in the HPI   ALLERGIES: Allergies  Allergen Reactions   Aspirin Rash and Other (See Comments)    GI distress   Tramadol Other (See Comments)    GI distress   Naproxen     Unknown reaction   Shellfish-Derived Products Itching and Rash    HOME MEDICATIONS: Outpatient Medications Prior to Visit  Medication Sig Dispense Refill   acetaminophen (TYLENOL) 325 MG tablet Take 2 tablets (650 mg total) by mouth every 6 (six) hours as needed for mild pain (pain score 1-3). 40 tablet 0   albuterol (VENTOLIN HFA) 108 (90 Base) MCG/ACT inhaler Inhale 2 puffs into the lungs every 6 (six) hours as needed for wheezing or shortness of breath.     allopurinol (ZYLOPRIM) 100 MG tablet Take by mouth.     ammonium lactate (AMLACTIN) 12 % cream Apply 1 Application topically in the morning and at bedtime.     ascorbic acid (VITAMIN C) 500 MG tablet Take 500 mg by mouth 2 (two) times daily.     Biotin 5000 MCG CAPS Take 5,000 mcg  by mouth daily. (0900)     carvedilol (COREG) 6.25 MG tablet Take 6.25 mg by mouth 2 (two) times daily with a meal. (0900 & 1700)     cetirizine (ZYRTEC) 10 MG tablet Take 10 mg by mouth daily.     Cholecalciferol (VITAMIN D) 50 MCG (2000 UT) CAPS Take 2,000 Units by mouth in the morning. (0900)     Cranberry 360 MG CAPS Take by mouth.     cyclobenzaprine (FLEXERIL) 5 MG tablet Take 1 tablet (5 mg total) by mouth 3 (three) times daily as needed for muscle spasms. 30 tablet 0   estradiol (ESTRACE) 0.1 MG/GM vaginal cream Discard plastic applicator. Insert a blueberry size amount  (approximately 1 gram) of cream on gloved fingertip inside vagina at bedtime every night. For long term use. 42.5 g 5   ferrous sulfate 325 (65 FE) MG EC tablet Take 325 mg by mouth 3 (three) times daily with meals.     furosemide (LASIX) 10 MG/ML solution Take by mouth daily.     guaiFENesin-dextromethorphan (ROBITUSSIN DM) 100-10 MG/5ML syrup Take 5 mLs by mouth every 4 (four) hours as needed for cough.     Magnesium 250 MG TABS Take 1 tablet by mouth daily.     melatonin 3 MG TABS tablet Take 3 mg by mouth at bedtime. (2100)     memantine (NAMENDA) 5 MG tablet Take 5 mg by mouth 2 (two) times daily.     oxyCODONE (OXY IR/ROXICODONE) 5 MG immediate release tablet Take 1 tablet (5 mg total) by mouth every 4 (four) hours as needed for moderate pain or severe pain (1 tablet for pain 4-6, 2 tablet for pain 7-10). 20 tablet 0   pantoprazole (PROTONIX) 40 MG tablet Take 40 mg by mouth daily.     polyethylene glycol powder (GLYCOLAX/MIRALAX) powder Take 17 g by mouth daily. (0900)     potassium chloride (KLOR-CON) 10 MEQ tablet Take 30 mEq by mouth 2 (two) times daily. (0900 & 2100)     pregabalin (LYRICA) 25 MG capsule Take 25 mg by mouth 2 (two) times daily.     rosuvastatin (CRESTOR) 40 MG tablet Take 40 mg by mouth daily.     sitaGLIPtin (JANUVIA) 50 MG tablet Take 50 mg by mouth in the morning. (0900)     sucralfate (CARAFATE) 1 g tablet Take 1 g by mouth 4 (four) times daily -  with meals and at bedtime.     furosemide (LASIX) 80 MG tablet Take 10 mg by mouth in the morning. (0900)     Amino Acids-Protein Hydrolys (PRO-STAT AWC) LIQD Take 30 mLs by mouth daily. (Patient not taking: Reported on 12/20/2023)     gabapentin (NEURONTIN) 400 MG capsule Take 1 capsule (400 mg total) by mouth 2 (two) times daily. 60 capsule 0   Menthol, Topical Analgesic, (BIOFREEZE COLORLESS EX) Apply 1 application  topically in the morning, at noon, and at bedtime. for neck pain (Patient not taking: Reported on  12/20/2023)     metolazone (ZAROXOLYN) 2.5 MG tablet Take 2.5 mg by mouth every other day. (Patient not taking: Reported on 12/20/2023)     Multiple Vitamins-Minerals (CERTA PLUS) TABS Take by mouth. (Patient not taking: Reported on 12/20/2023)     nitroGLYCERIN (NITROSTAT) 0.4 MG SL tablet Place 0.4 mg under the tongue every 5 (five) minutes as needed for chest pain (X 3 DOSES FOR CHEST PAIN). (Patient not taking: Reported on 12/20/2023)     No facility-administered medications  prior to visit.    PAST MEDICAL HISTORY: Past Medical History:  Diagnosis Date   Arthritis    "qwhere"   AV block     2:1  January 14, 2011 / pacemaker plan when cellulitis resolved in length   Bradycardia    2:1  AV block   Cellulitis    Superficial cellulitis right lower leg   January 14, 2011   Chronic diastolic CHF (congestive heart failure) (HCC)    Diabetes mellitus without complication (HCC)    Edema    . EF 65-70%, echo, November 29, 2010   Ejection fraction    EF 65%, echo, January, 2012   Gout    Hypertension    Hypokalemia    October, 2012   Murmur    No significant valvular disease by echo, January, 2012   Pacemaker    January, 2013 Medtronic Sensa   Pulmonary hypertension (HCC)     46 mmHg... echo... January, 2012    PAST SURGICAL HISTORY: Past Surgical History:  Procedure Laterality Date   ABDOMINAL AORTOGRAM W/LOWER EXTREMITY Left 12/23/2022   Procedure: ABDOMINAL AORTOGRAM W/LOWER EXTREMITY;  Surgeon: Leonie Douglas, MD;  Location: [facility] INVASIVE CV LAB;  Service: Cardiovascular;  Laterality: Left;   AMPUTATION Left 12/26/2022   Procedure: AMPUTATION ABOVE KNEE;  Surgeon: Leonie Douglas, MD;  Location: [facility] OR;  Service: Vascular;  Laterality: Left;   APPLICATION OF WOUND VAC Left 12/26/2022   Procedure: APPLICATION OF WOUND VAC;  Surgeon: Leonie Douglas, MD;  Location: [facility] OR;  Service: Vascular;  Laterality: Left;   CHOLECYSTECTOMY     HIP ARTHROPLASTY Right    INSERT / REPLACE / REMOVE  PACEMAKER  11/2011   PARTIAL HYSTERECTOMY     vaginal   PERMANENT PACEMAKER INSERTION N/A 12/09/2011   Procedure: PERMANENT PACEMAKER INSERTION;  Surgeon: Marinus Maw, MD;  Location[facility] C[facility] LAB;  Service: Cardiovascular;  Laterality: N/A;   PPM GENERATOR CHANGEOUT N/A 01/03/2022   Procedure: PPM GENERATOR CHANGEOUT;  Surgeon: Marinus Maw, MD;  Location: [facility] INVASIVE CV LAB;  Service: Cardiovascular;  Laterality: N/A;    FAMILY HISTORY: Family History  Problem Relation Age of Onset   Cardiomyopathy Father    Diabetes Father    Heart failure Father    Dementia Mother    Cancer Son    Coronary artery disease Son    Heart attack Neg Hx     SOCIAL HISTORY: Social History   Socioeconomic History   Marital status: Widowed    Spouse name: Not on file   Number of children: Not on file   Years of education: Not on file   Highest education level: Not on file  Occupational History   Occupation: Retired    Associate Professor: RETIRED  Tobacco Use   Smoking status: Never   Smokeless tobacco: Never  Vaping Use   Vaping status: Never Used  Substance and Sexual Activity   Alcohol use: No   Drug use: No   Sexual activity: Never  Other Topics Concern   Not on file  Social History Narrative   Retired    Widowed    Tobacco Use - No.    Alcohol Use - no   Regular Exercise - no   Drug Use - no   Social Drivers of Corporate investment banker Strain: Not on file  Food Insecurity: Not on file  Transportation Needs: Not on file  Physical Activity: Not on file  Stress: Not on file  Social Connections: Not on file  Intimate Partner Violence: Not on file    PHYSICAL EXAM  GENERAL EXAM/CONSTITUTIONAL: Vitals:  Vitals:   12/20/23 0925  BP: 137/70  Pulse: 79  Height: 4\' 11"  (1.499 m)   Body mass index is 35.35 kg/m. Wt Readings from Last 3 Encounters:  10/12/23 175 lb (79.4 kg)  09/05/23 171 lb (77.6 kg)  08/31/23 178 lb 8 oz (81 kg)   Patient is in no distress; well  developed, nourished and groomed; neck is supple  MUSCULOSKELETAL: Gait, strength, tone, movements noted in Neurologic exam below  NEUROLOGIC: MENTAL STATUS:     12/20/2023    9:30 AM  MMSE - Mini Mental State Exam  Orientation to time 4  Orientation to Place 4  Registration 3  Attention/ Calculation 0  Recall 1  Language- name 2 objects 2  Language- repeat 1  Language- follow 3 step command 1  Language- read & follow direction 1  Write a sentence 0  Copy design 0  Total score 17    CRANIAL NERVE:  2nd, 3rd, 4th, 6th- visual fields full to confrontation, extraocular muscles intact, no nystagmus 5th - facial sensation symmetric 7th - facial strength symmetric 8th - hearing intact 9th - palate elevates symmetrically, uvula midline 11th - shoulder shrug symmetric 12th - tongue protrusion midline  MOTOR:  Able to lift both arms and right leg antigravity but limited by pain. Left leg amputation.   SENSORY:  normal and symmetric to light touch  GAIT/STATION:  Deferred, on a wheelchair    DIAGNOSTIC DATA (LABS, IMAGING, TESTING) - I reviewed patient records, labs, notes, testing and imaging myself where available.  Lab Results  Component Value Date   WBC 8.4 05/30/2023   HGB 10.5 (L) 05/30/2023   HCT 32.0 (L) 05/30/2023   MCV 81.0 05/30/2023   PLT 321 05/30/2023      Component Value Date/Time   NA 132 (L) 05/30/2023 0039   NA 140 12/10/2021 1241   K 2.9 (L) 05/30/2023 0039   CL 91 (L) 05/30/2023 0039   CO2 33 (H) 05/30/2023 0039   GLUCOSE 105 (H) 05/30/2023 0039   BUN 50 (H) 05/30/2023 0039   BUN 34 (H) 12/10/2021 1241   CREATININE 1.57 (H) 05/30/2023 0039   CALCIUM 9.1 05/30/2023 0039   PROT 7.8 05/08/2023 1813   PROT 7.5 12/10/2021 1241   ALBUMIN 3.0 (L) 05/08/2023 1813   ALBUMIN 4.1 12/10/2021 1241   AST 21 05/08/2023 1813   ALT 10 05/08/2023 1813   ALKPHOS 78 05/08/2023 1813   BILITOT 0.5 05/08/2023 1813   BILITOT <0.2 12/10/2021 1241   GFRNONAA  31 (L) 05/30/2023 0039   GFRAA 59 (L) 12/14/2020 1408   Lab Results  Component Value Date   CHOL 173 12/24/2022   HDL 31 (L) 12/24/2022   LDLCALC 118 (H) 12/24/2022   TRIG 122 12/24/2022   CHOLHDL 5.6 12/24/2022   Lab Results  Component Value Date   HGBA1C 7.0 (H) 12/22/2022   No results found for: "VITAMINB12" Lab Results  Component Value Date   TSH 3.140 12/10/2021    CT Head 05/30/2023 1. Generalized cerebral atrophy with chronic white matter small vessel ischemic changes. 2. No acute intracranial abnormality.   ASSESSMENT AND PLAN  89 y.o. year old female with hypertension, hyperlipidemia, diabetes mellitus, PAD, lung disease on supplemental oxygen, gout, recurrent UTIs , who is presenting with memory problem.  Today on exam she was noted to be repetitive and her  MMSE was 17/30 indicative of impairment.  Her activity of daily living is limited mainly due to physical limitation, she has been living at [facility] for the past 8 years.  I suspect patient is on the cusp of mild dementia versus mild cognitive impairment.  Will obtain her dementia lab including ATN, B12 and TSH.  I have explained to them and also son realizes that when she has an infection her memory will get worse and she can confabulate.  She does have a history of heart disease, tells me that she had 2 pacemakers, therefore we will not recommend an acetylcholinesterase inhibitor due to cardiac side effects.  Plan will be for patient to continue current medications and to continue following up with PCP.  I will contact him to go over the results   1. Cognitive impairment   2. Memory loss or impairment      Patient Instructions  I suspect Mild cognitive impairment vs. Mild dementia Will obtain dementia labs: ATN, B12 and TSH  Continue current medications  Return as needed   There are well-accepted and sensible ways to reduce risk for Alzheimers disease and other degenerative brain disorders .  Exercise  Daily Walk A daily 20 minute walk should be part of your routine. Disease related apathy can be a significant roadblock to exercise and the only way to overcome this is to make it a daily routine and perhaps have a reward at the end (something your loved one loves to eat or drink perhaps) or a personal trainer coming to the home can also be very useful. Most importantly, the patient is much more likely to exercise if the caregiver / spouse does it with him/her. In general a structured, repetitive schedule is best.  General Health: Any diseases which effect your body will effect your brain such as a pneumonia, urinary infection, blood clot, heart attack or stroke. Keep contact with your primary care doctor for regular follow ups.  Sleep. A good nights sleep is healthy for the brain. Seven hours is recommended. If you have insomnia or poor sleep habits we can give you some instructions. If you have sleep apnea wear your mask.  Diet: Eating a heart healthy diet is also a good idea; fish and poultry instead of red meat, nuts (mostly non-peanuts), vegetables, fruits, olive oil or canola oil (instead of butter), minimal salt (use other spices to flavor foods), whole grain rice, bread, cereal and pasta and wine in moderation.Research is now showing that the MIND diet, which is a combination of The Mediterranean diet and the DASH diet, is beneficial for cognitive processing and longevity. Information about this diet can be found in The MIND Diet, a book by Alonna Minium, MS, RDN, and online at [employer]  Finances, Power of [address] and Advance Directives: You should consider putting legal safeguards in place with regard to financial and medical decision making. While the spouse always has power of attorney for medical and financial issues in the absence of any form, you should consider what you want in case the spouse / caregiver is no longer around or capable of making decisions.     Orders Placed This Encounter  Procedures   ATN PROFILE   Vitamin B12   TSH    No orders of the defined types were placed in this encounter.   Return if symptoms worsen or fail to improve.    Windell Norfolk, MD 12/20/2023, 3:49 PM  [facility] [address], [address] [place],  [place] [address] [phone]

## 2023-12-20 NOTE — Patient Instructions (Signed)
I suspect Mild cognitive impairment vs. Mild dementia Will obtain dementia labs: ATN, B12 and TSH  Continue current medications  Return as needed   There are well-accepted and sensible ways to reduce risk for Alzheimers disease and other degenerative brain disorders .  Exercise Daily Walk A daily 20 minute walk should be part of your routine. Disease related apathy can be a significant roadblock to exercise and the only way to overcome this is to make it a daily routine and perhaps have a reward at the end (something your loved one loves to eat or drink perhaps) or a personal trainer coming to the home can also be very useful. Most importantly, the patient is much more likely to exercise if the caregiver / spouse does it with him/her. In general a structured, repetitive schedule is best.  General Health: Any diseases which effect your body will effect your brain such as a pneumonia, urinary infection, blood clot, heart attack or stroke. Keep contact with your primary care doctor for regular follow ups.  Sleep. A good nights sleep is healthy for the brain. Seven hours is recommended. If you have insomnia or poor sleep habits we can give you some instructions. If you have sleep apnea wear your mask.  Diet: Eating a heart healthy diet is also a good idea; fish and poultry instead of red meat, nuts (mostly non-peanuts), vegetables, fruits, olive oil or canola oil (instead of butter), minimal salt (use other spices to flavor foods), whole grain rice, bread, cereal and pasta and wine in moderation.Research is now showing that the MIND diet, which is a combination of The Mediterranean diet and the DASH diet, is beneficial for cognitive processing and longevity. Information about this diet can be found in The MIND Diet, a book by Alonna Minium, MS, RDN, and online at [employer]  Finances, Power of [address] and Advance Directives: You should consider putting legal safeguards  in place with regard to financial and medical decision making. While the spouse always has power of attorney for medical and financial issues in the absence of any form, you should consider what you want in case the spouse / caregiver is no longer around or capable of making decisions.

## 2023-12-23 LAB — ATN PROFILE
A -- Beta-amyloid 42/40 Ratio: 0.112 (ref >0.102)
Beta-amyloid 40: 337.53 pg/mL
Beta-amyloid 42: 37.97 pg/mL
N -- NfL, Plasma: 12.7 pg/mL — ABNORMAL HIGH (ref 0.00–11.55)
T -- p-tau181: 1.84 pg/mL — ABNORMAL HIGH (ref 0.00–0.97)

## 2023-12-23 LAB — TSH: TSH: 2.88 u[IU]/mL (ref 0.450–4.500)

## 2023-12-23 LAB — VITAMIN B12: Vitamin B-12: 523 pg/mL (ref 232–1245)

## 2023-12-26 NOTE — Progress Notes (Signed)
Please call and advise the patient that the recent labs we checked did not show evidence of Alzheimer dementia pathology. She might have another form of dementia such as mild vascular dementia.  No further action is required on these tests at this time. Please remind patient to keep any upcoming appointments or tests and to call us with any interim questions, concerns, problems or updates. Thanks,   Windell Norfolk, MD

## 2023-12-27 NOTE — Progress Notes (Deleted)
 Cardiology Office Note   Date:  12/27/2023   ID:  [name], DOB [birth year], MRN [id number]  PCP:  [name]    Cardiology:  [name] _ > [name]  Chronic diastolic heart failure  Wt Readings from Last 3 Encounters:  10/12/23 175 lb (79.4 kg)  09/05/23 171 lb (77.6 kg)  08/31/23 178 lb 8 oz (81 kg)       History of Present Illness: [name] is a 89 y.o. female   Who previously saw Dr. Eldridge [name]  She has chronic diastolic heart failure and permanent pacemaker placement. She is wheelchair-bound.   She is managed with furosemide and metolazone.    She is pacer dependent. Sees Dr [name]    Severe PVD hospitalized for LLE gangrene 12/2022 requiring AKA by Dr [name] 12/26/22   Denies : Chest pain. Dizziness. Nitroglycerin use. Orthopnea. Palpitations. Paroxysmal nocturnal dyspnea. Shortness of breath. Syncope.    She wears oxygen at night Lives at [facility] for last 8 years  ***  Past Medical History:  Diagnosis Date   Arthritis    "qwhere"   AV block     2:1  January 14, 2011 / pacemaker plan when cellulitis resolved in length   Bradycardia    2:1  AV block   Cellulitis    Superficial cellulitis right lower leg   January 14, 2011   Chronic diastolic CHF (congestive heart failure) (HCC)    Diabetes mellitus without complication (HCC)    Edema    . EF 65-70%, echo, November 29, 2010   Ejection fraction    EF 65%, echo, January, 2012   Gout    Hypertension    Hypokalemia    October, 2012   Murmur    No significant valvular disease by echo, January, 2012   Pacemaker    January, 2013 Medtronic Sensa   Pulmonary hypertension (HCC)     46 mmHg... echo... January, 2012    Past Surgical History:  Procedure Laterality Date   ABDOMINAL AORTOGRAM W/LOWER EXTREMITY Left 12/23/2022   Procedure: ABDOMINAL AORTOGRAM W/LOWER EXTREMITY;  Surgeon: [name] Douglas, MD;  Location: [facility] INVASIVE CV LAB;  Service: Cardiovascular;  Laterality: Left;    AMPUTATION Left 12/26/2022   Procedure: AMPUTATION ABOVE KNEE;  Surgeon: [name] Douglas, MD;  Location: [facility] OR;  Service: Vascular;  Laterality: Left;   APPLICATION OF WOUND VAC Left 12/26/2022   Procedure: APPLICATION OF WOUND VAC;  Surgeon: [name] Douglas, MD;  Location: [facility] OR;  Service: Vascular;  Laterality: Left;   CHOLECYSTECTOMY     HIP ARTHROPLASTY Right    INSERT / REPLACE / REMOVE PACEMAKER  11/2011   PARTIAL HYSTERECTOMY     vaginal   PERMANENT PACEMAKER INSERTION N/A 12/09/2011   Procedure: PERMANENT PACEMAKER INSERTION;  Surgeon: [name] Maw, MD;  Location: [facility] CATH LAB;  Service: Cardiovascular;  Laterality: N/A;   PPM GENERATOR CHANGEOUT N/A 01/03/2022   Procedure: PPM GENERATOR CHANGEOUT;  Surgeon: [name] Maw, MD;  Location: [facility] INVASIVE CV LAB;  Service: Cardiovascular;  Laterality: N/A;     Current Outpatient Medications  Medication Sig Dispense Refill   acetaminophen (TYLENOL) 325 MG tablet Take 2 tablets (650 mg total) by mouth every 6 (six) hours as needed for mild pain (pain score 1-3). 40 tablet 0   albuterol (VENTOLIN HFA) 108 (90 Base) MCG/ACT inhaler Inhale 2 puffs into the lungs every 6 (six) hours as needed for wheezing or shortness of breath.  allopurinol (ZYLOPRIM) 100 MG tablet Take by mouth.     Amino Acids-Protein Hydrolys (PRO-STAT AWC) LIQD Take 30 mLs by mouth daily. (Patient not taking: Reported on 12/20/2023)     ammonium lactate (AMLACTIN) 12 % cream Apply 1 Application topically in the morning and at bedtime.     ascorbic acid (VITAMIN C) 500 MG tablet Take 500 mg by mouth 2 (two) times daily.     Biotin 5000 MCG CAPS Take 5,000 mcg by mouth daily. (0900)     carvedilol (COREG) 6.25 MG tablet Take 6.25 mg by mouth 2 (two) times daily with a meal. (0900 & 1700)     cetirizine (ZYRTEC) 10 MG tablet Take 10 mg by mouth daily.     Cholecalciferol (VITAMIN D) 50 MCG (2000 UT) CAPS Take 2,000 Units by mouth in the morning. (0900)     Cranberry 360 MG  CAPS Take by mouth.     cyclobenzaprine (FLEXERIL) 5 MG tablet Take 1 tablet (5 mg total) by mouth 3 (three) times daily as needed for muscle spasms. 30 tablet 0   estradiol (ESTRACE) 0.1 MG/GM vaginal cream Discard plastic applicator. Insert a blueberry size amount (approximately 1 gram) of cream on gloved fingertip inside vagina at bedtime every night. For long term use. 42.5 g 5   ferrous sulfate 325 (65 FE) MG EC tablet Take 325 mg by mouth 3 (three) times daily with meals.     furosemide (LASIX) 10 MG/ML solution Take by mouth daily.     gabapentin (NEURONTIN) 400 MG capsule Take 1 capsule (400 mg total) by mouth 2 (two) times daily. 60 capsule 0   guaiFENesin-dextromethorphan (ROBITUSSIN DM) 100-10 MG/5ML syrup Take 5 mLs by mouth every 4 (four) hours as needed for cough.     Magnesium 250 MG TABS Take 1 tablet by mouth daily.     melatonin 3 MG TABS tablet Take 3 mg by mouth at bedtime. (2100)     memantine (NAMENDA) 5 MG tablet Take 5 mg by mouth 2 (two) times daily.     Menthol, Topical Analgesic, (BIOFREEZE COLORLESS EX) Apply 1 application  topically in the morning, at noon, and at bedtime. for neck pain (Patient not taking: Reported on 12/20/2023)     metolazone (ZAROXOLYN) 2.5 MG tablet Take 2.5 mg by mouth every other day. (Patient not taking: Reported on 12/20/2023)     Multiple Vitamins-Minerals (CERTA PLUS) TABS Take by mouth. (Patient not taking: Reported on 12/20/2023)     nitroGLYCERIN (NITROSTAT) 0.4 MG SL tablet Place 0.4 mg under the tongue every 5 (five) minutes as needed for chest pain (X 3 DOSES FOR CHEST PAIN). (Patient not taking: Reported on 12/20/2023)     oxyCODONE (OXY IR/ROXICODONE) 5 MG immediate release tablet Take 1 tablet (5 mg total) by mouth every 4 (four) hours as needed for moderate pain or severe pain (1 tablet for pain 4-6, 2 tablet for pain 7-10). 20 tablet 0   pantoprazole (PROTONIX) 40 MG tablet Take 40 mg by mouth daily.     polyethylene glycol powder  (GLYCOLAX/MIRALAX) powder Take 17 g by mouth daily. (0900)     potassium chloride (KLOR-CON) 10 MEQ tablet Take 30 mEq by mouth 2 (two) times daily. (0900 & 2100)     pregabalin (LYRICA) 25 MG capsule Take 25 mg by mouth 2 (two) times daily.     rosuvastatin (CRESTOR) 40 MG tablet Take 40 mg by mouth daily.     sitaGLIPtin (JANUVIA) 50 MG tablet  Take 50 mg by mouth in the morning. (0900)     sucralfate (CARAFATE) 1 g tablet Take 1 g by mouth 4 (four) times daily -  with meals and at bedtime.     No current facility-administered medications for this visit.    Allergies:   Aspirin, Tramadol, Naproxen, and Shellfish-derived products    Social History:  The patient  reports that she has never smoked. She has never used smokeless tobacco. She reports that she does not drink alcohol and does not use drugs.   Family History:  The patient's family history includes Cancer in her son; Cardiomyopathy in her father; Coronary artery disease in her son; Dementia in her mother; Diabetes in her father; Heart failure in her father.    ROS:  Please see the history of present illness.   Otherwise, review of systems are positive for left stump pain-improved.   All other systems are reviewed and negative.    PHYSICAL EXAM: VS:  There were no vitals taken for this visit. , BMI   Chronically ill PPM under left clavicle Lungs clear *** Prior left AKA ***   EKG:   The ekg ordered Feb 2024 demonstrates NSR, LBBB   Recent Labs: 05/08/2023: ALT 10 05/30/2023: BUN 50; Creatinine, Ser 1.57; Hemoglobin 10.5; Platelets 321; Potassium 2.9; Sodium 132 12/20/2023: TSH 2.880   Lipid Panel    Component Value Date/Time   CHOL 173 12/24/2022 0119   TRIG 122 12/24/2022 0119   HDL 31 (L) 12/24/2022 0119   CHOLHDL 5.6 12/24/2022 0119   VLDL 24 12/24/2022 0119   LDLCALC 118 (H) 12/24/2022 0119     Other studies Reviewed: Additional studies/ records that were reviewed today with results demonstrating: hospital  records reviewed; renal fnuctionand potassium normal at discharge on 12/31/22.   ASSESSMENT AND PLAN:  Chronic diastolic heart failure: Difficult to assess volume status due to obesity.  She has some right leg edema.  Continue diuretics  Morbid obesity: Chronic issue.   Hypertension: borderline control. Having some stump pain from the surgical site which may be increasing BP.  PVD:  post left AKA 12/2022 was wheel chair bound prior to this  F/U Dr [name] VVS    Current medicines are reviewed at length with the patient today.  The patient concerns regarding her medicines were addressed.  The following changes have been made:  No change  Labs/ tests ordered today include:  No orders of the defined types were placed in this encounter.   Recommend 150 minutes/week of aerobic exercise Low fat, low carb, high fiber diet recommended  Disposition:   FU in 1 year   Signed, Charlton Haws, MD  12/27/2023 8:33 AM    [facility] [address] [place], [place], [place]  [address] Phone: [phone]; Fax: [phone]

## 2024-01-03 ENCOUNTER — Ambulatory Visit: Payer: Medicare Other | Admitting: Cardiovascular Disease

## 2024-01-08 ENCOUNTER — Ambulatory Visit (INDEPENDENT_AMBULATORY_CARE_PROVIDER_SITE_OTHER): Payer: Medicare Other

## 2024-01-08 DIAGNOSIS — I442 Atrioventricular block, complete: Secondary | ICD-10-CM

## 2024-01-09 LAB — CUP PACEART REMOTE DEVICE CHECK
Battery Remaining Longevity: 116 mo
Battery Voltage: 3.02 V
Brady Statistic AP VP Percent: 35.03 %
Brady Statistic AP VS Percent: 0 %
Brady Statistic AS VP Percent: 64.87 %
Brady Statistic AS VS Percent: 0.09 %
Brady Statistic RA Percent Paced: 35.02 %
Brady Statistic RV Percent Paced: 99.91 %
Date Time Interrogation Session: 20250217100837
Implantable Lead Connection Status: 753985
Implantable Lead Connection Status: 753985
Implantable Lead Implant Date: 20130118
Implantable Lead Implant Date: 20130118
Implantable Lead Location: 753859
Implantable Lead Location: 753860
Implantable Lead Model: 5076
Implantable Lead Model: 5076
Implantable Pulse Generator Implant Date: 20230213
Lead Channel Impedance Value: 266 Ohm
Lead Channel Impedance Value: 323 Ohm
Lead Channel Impedance Value: 361 Ohm
Lead Channel Impedance Value: 399 Ohm
Lead Channel Pacing Threshold Amplitude: 1 V
Lead Channel Pacing Threshold Amplitude: 1.25 V
Lead Channel Pacing Threshold Pulse Width: 0.4 ms
Lead Channel Pacing Threshold Pulse Width: 0.4 ms
Lead Channel Sensing Intrinsic Amplitude: 0.75 mV
Lead Channel Sensing Intrinsic Amplitude: 0.75 mV
Lead Channel Sensing Intrinsic Amplitude: 3.375 mV
Lead Channel Sensing Intrinsic Amplitude: 3.375 mV
Lead Channel Setting Pacing Amplitude: 2 V
Lead Channel Setting Pacing Amplitude: 2.5 V
Lead Channel Setting Pacing Pulse Width: 0.4 ms
Lead Channel Setting Sensing Sensitivity: 2.8 mV
Zone Setting Status: 755011
Zone Setting Status: 755011

## 2024-01-10 ENCOUNTER — Ambulatory Visit: Payer: Medicare Other | Admitting: Cardiology

## 2024-02-13 NOTE — Progress Notes (Signed)
 Remote pacemaker transmission.

## 2024-02-13 NOTE — Addendum Note (Signed)
 Addended by: Geralyn Flash D on: 02/13/2024 01:28 PM   Modules accepted: Orders

## 2024-03-08 ENCOUNTER — Encounter: Payer: Self-pay | Admitting: Cardiology

## 2024-03-08 ENCOUNTER — Ambulatory Visit: Payer: Medicare Other | Attending: Cardiology | Admitting: Cardiology

## 2024-03-08 VITALS — BP 136/70 | HR 60 | Ht 59.0 in | Wt 175.0 lb

## 2024-03-08 DIAGNOSIS — I5032 Chronic diastolic (congestive) heart failure: Secondary | ICD-10-CM | POA: Insufficient documentation

## 2024-03-08 DIAGNOSIS — I442 Atrioventricular block, complete: Secondary | ICD-10-CM | POA: Diagnosis present

## 2024-03-08 DIAGNOSIS — J9612 Chronic respiratory failure with hypercapnia: Secondary | ICD-10-CM | POA: Diagnosis present

## 2024-03-08 DIAGNOSIS — I10 Essential (primary) hypertension: Secondary | ICD-10-CM | POA: Diagnosis not present

## 2024-03-08 DIAGNOSIS — J9611 Chronic respiratory failure with hypoxia: Secondary | ICD-10-CM | POA: Diagnosis not present

## 2024-03-08 NOTE — Progress Notes (Signed)
 Cardiology Office Note:   Date:  03/08/2024  ID:  [name], DOB 16-Jul-1931, MRN [id number] PCP: [name]  [facility] Providers Cardiologist:  [name] Bodo, MD    History of Present Illness:     Discussed the use of AI scribe software for clinical note transcription with the patient, who gave verbal consent to proceed.  History of Present Illness [name] is a 89 year old female with chronic diastolic heart failure, complete heart block s/p PPM, venous stasis, left AKA, dementia who presents for follow-up. She has been followed by Dr. Luther [name] in the past, follows with Dr. Carolynne [name] with EP. Patient accompanied by her son and aid from her nursing facility.   Today she reports that she experiences poor sleep and occasional shoulder pain. She recalls a past neck injury related to a lift incident, which caused significant pain and stiffness, though it has subsided somewhat.  She also notes that there is swelling in her hands. Patient with chronic LE edema in right leg, takes Lasix  and occasionally uses a compression boot to help manage it (staff member notes that patient dislikes).   She has a history of chronic diastolic heart failure and has been on oxygen therapy for over a year, initiated after an episode of nocturnal hypoxemia. She uses oxygen intermittently, with her oxygen saturation noted to be 97% during the visit.  She reports a decrease in appetite and some weight loss. Per son and staff member, her eating habits vary depending on the quality of food provided, and she sometimes requests food from outside.  She mentions a past dental issue where a tooth broke while eating, causing prolonged pain until it was extracted. She still experiences some discomfort in the area.  She is on a low dose of Lasix  (10 mg) for fluid management, having previously been on a higher dose (80 mg). She has also taken Metolazone  in the past. Unclear what prompted decrease in  Lasix .   Patient is wheelchair bound. She uses exercise bands for seated exercises due to difficulty standing or walking.   Studies Reviewed:      Risk Assessment/Calculations:              Physical Exam:   VS:  BP 136/70   Pulse 60   Ht 4\' 11"  (1.499 m)   Wt 175 lb (79.4 kg)   SpO2 97%   BMI 35.35 kg/m    Wt Readings from Last 3 Encounters:  03/08/24 175 lb (79.4 kg)  10/12/23 175 lb (79.4 kg)  09/05/23 171 lb (77.6 kg)     Physical Exam Vitals reviewed.  Constitutional:      Appearance: She is obese.     Comments: Wheel-chair bound  HENT:     Head: Normocephalic.     Nose: Nose normal.  Eyes:     Pupils: Pupils are equal, round, and reactive to light.  Cardiovascular:     Rate and Rhythm: Normal rate and regular rhythm.     Pulses: Normal pulses.     Heart sounds: Normal heart sounds. No murmur heard.    No friction rub. No gallop.  Pulmonary:     Effort: Pulmonary effort is normal.     Breath sounds: Normal breath sounds.     Comments: Faint bibasilar crackles Abdominal:     General: Abdomen is flat.  Musculoskeletal:     Right lower leg: Edema (2-3+ pitting edema) present.     Comments: Left AKA  Skin:  General: Skin is warm and dry.     Capillary Refill: Capillary refill takes less than 2 seconds.  Neurological:     General: No focal deficit present.     Mental Status: She is alert and oriented to person, place, and time.  Psychiatric:        Mood and Affect: Mood normal.        Behavior: Behavior normal.        Thought Content: Thought content normal.        Judgment: Judgment normal.     Comments: Mild confusion noted      ASSESSMENT AND PLAN:    Assessment & Plan Chronic Diastolic Heart Failure Limited history from patient but she appears to be stable. Breathing is well-managed. She is on low-dose Lasix  (10 mg) for fluid management, having previously been on 80mg . Today she exhibits right leg swelling and faint crackles at lung bases,  suggesting fluid retention. I am hesitant to increase lasix  without current kidney function data given that last K appears to have been less than 3. - Order metabolic panel to assess kidney function and potassium levels - Consider increasing Lasix  dosage if kidney function is stable to manage fluid retention - Unable to add MRA with renal insufficiency - No SGLT2 given wheelchair bound status and recurrent UTI.  Hypertension BP initially elevated today, improved on recheck.  - Continue Coreg   Hyperlipidemia No recent lipid panel - Update LDL direct today - Continue Rosuvastatin  Complete Heart Block, Status Post Pacemaker She is status post pacemaker insertion for complete heart block, with the device functioning well. She reports no syncope or significant cardiac symptoms. - Continue regular follow-up with electrophysiologist for pacemaker management  Venous Stasis She experiences occasional leg swelling managed with compression garments. - Encourage continued use of compression garments to manage swelling  Chronic Respiratory Failure on Oxygen Therapy She has been on oxygen therapy for over a year due to nocturnal hypoxemia. Current oxygen saturation is 97% without nasal cannula, but she is advised to use oxygen as needed. - Continue oxygen therapy as needed, especially at night  Follow-up She needs to establish care with a new cardiologist following Dr. Willey [name] retirement. It has apparently previously been recommended that she establish care with Dr. Renna [name]. - Schedule follow-up appointment with Dr. Renna [name] in 6 months           Signed, [name] Prince, PA-C

## 2024-03-08 NOTE — Patient Instructions (Signed)
 Medication Instructions:   Your physician recommends that you continue on your current medications as directed. Please refer to the Current Medication list given to you today.   *If you need a refill on your cardiac medications before your next appointment, please call your pharmacy*    Lab Work:  PLEASE GO DOWN STAIRS  [facility]  FIRST FLOOR  [address] ( GET OFF ELEVATORS MAKE A LEFT AND ANOTHER LEFT LAB ON RIGHT DOWN HALLWAY :  CMET AND LDL DIRECT      If you have labs (blood work) drawn today and your tests are completely normal, you will receive your results only by: MyChart Message (if you have MyChart) OR A paper copy in the mail If you have any lab test that is abnormal or we need to change your treatment, we will call you to review the results.  Testing/Procedures: NONE ORDERED  TODAY     Follow-Up: At [facility], you and your health needs are our priority.  As part of our continuing mission to provide you with exceptional heart care, our providers are all part of one team.  This team includes your primary Cardiologist (physician) and Advanced Practice Providers or APPs (Physician Assistants and Nurse Practitioners) who all work together to provide you with the care you need, when you need it.  Your next appointment:    6 month(s)   Provider:  DR Renna Cary       We recommend signing up for the patient portal called "MyChart".  Sign up information is provided on this After Visit Summary.  MyChart is used to connect with patients for Virtual Visits (Telemedicine).  Patients are able to view lab/test results, encounter notes, upcoming appointments, etc.  Non-urgent messages can be sent to your provider as well.   To learn more about what you can do with MyChart, go to [email or web address].   Other Instructions        1st Floor: - Lobby - Registration  - Pharmacy  - Lab - Cafe  2nd Floor: - PV Lab - Diagnostic Testing (echo, CT, nuclear med)  3rd  Floor: - Vacant  4th Floor: - TCTS (cardiothoracic surgery) - AFib Clinic - Structural Heart Clinic - Vascular Surgery  - Vascular Ultrasound  5th Floor: - HeartCare Cardiology (general and EP) - Clinical Pharmacy for coumadin, hypertension, lipid, weight-loss medications, and med management appointments    Valet parking services will be available as well.

## 2024-04-08 ENCOUNTER — Ambulatory Visit (INDEPENDENT_AMBULATORY_CARE_PROVIDER_SITE_OTHER): Payer: Medicare Other

## 2024-04-08 DIAGNOSIS — I442 Atrioventricular block, complete: Secondary | ICD-10-CM

## 2024-04-09 LAB — CUP PACEART REMOTE DEVICE CHECK
Battery Remaining Longevity: 100 mo
Battery Voltage: 3.01 V
Brady Statistic AP VP Percent: 79.65 %
Brady Statistic AP VS Percent: 0.01 %
Brady Statistic AS VP Percent: 20.21 %
Brady Statistic AS VS Percent: 0.13 %
Brady Statistic RA Percent Paced: 79.72 %
Brady Statistic RV Percent Paced: 99.85 %
Date Time Interrogation Session: 20250519023348
Implantable Lead Connection Status: 753985
Implantable Lead Connection Status: 753985
Implantable Lead Implant Date: 20130118
Implantable Lead Implant Date: 20130118
Implantable Lead Location: 753859
Implantable Lead Location: 753860
Implantable Lead Model: 5076
Implantable Lead Model: 5076
Implantable Pulse Generator Implant Date: 20230213
Lead Channel Impedance Value: 285 Ohm
Lead Channel Impedance Value: 342 Ohm
Lead Channel Impedance Value: 361 Ohm
Lead Channel Impedance Value: 399 Ohm
Lead Channel Pacing Threshold Amplitude: 1.125 V
Lead Channel Pacing Threshold Amplitude: 1.25 V
Lead Channel Pacing Threshold Pulse Width: 0.4 ms
Lead Channel Pacing Threshold Pulse Width: 0.4 ms
Lead Channel Sensing Intrinsic Amplitude: 0.75 mV
Lead Channel Sensing Intrinsic Amplitude: 0.75 mV
Lead Channel Sensing Intrinsic Amplitude: 3.375 mV
Lead Channel Sensing Intrinsic Amplitude: 3.375 mV
Lead Channel Setting Pacing Amplitude: 2.25 V
Lead Channel Setting Pacing Amplitude: 2.5 V
Lead Channel Setting Pacing Pulse Width: 0.4 ms
Lead Channel Setting Sensing Sensitivity: 2.8 mV
Zone Setting Status: 755011
Zone Setting Status: 755011

## 2024-04-10 ENCOUNTER — Ambulatory Visit: Payer: Self-pay | Admitting: Internal Medicine

## 2024-04-29 NOTE — Progress Notes (Deleted)
 Cardiology Office Note Date:  04/29/2024  Patient ID:  [name], [name] 10-06-1931, MRN [id number] PCP:  Oswald Bless  Cardiologist:  Dr. Jacquelynn Matter (inactive) Electrophysiologist: Dr. Carolynne Citron     Chief Complaint: *** annual visit  History of Present Illness: [name] is a 89 y.o. female with history of DM, HLD, HTN,  2:1 block/CHB > PPM,  chronic, lower extremity venous stasis and cellulitis,  >> eventual Left AKA dementia chronic CHF (diastolic) w/HS oxygen  -----  following over the years with Dr. Jacquelynn Matter and Dr. Carolynne Citron as well as myself and cards APPs over the years  She saw Dr. Carolynne Citron 05/02/23,  doing well, no changes were made  She saw E. Broadus Canes, PA-C on 03/08/24, RLE edema managed with compression and lasix . Poor overall oral intake reported for a few reasons, dental pain, food choices at her ALF, low appetite Doing chair exercises Some crackles on exam, planned for labs +/- adjustment in her diuretic Planned to establish with Dr. Renna Cary  TODAY  *** device only *** volume >> cards *** she is dependent  Device information MDT dual chamber PPM, implanted 12/09/2011, gen change 01/03/22   Past Medical History:  Diagnosis Date   Arthritis    "qwhere"   AV block     2:1  January 14, 2011 / pacemaker plan when cellulitis resolved in length   Bradycardia    2:1  AV block   Cellulitis    Superficial cellulitis right lower leg   January 14, 2011   Chronic diastolic CHF (congestive heart failure) (HCC)    Diabetes mellitus without complication (HCC)    Edema    . EF 65-70%, echo, November 29, 2010   Ejection fraction    EF 65%, echo, January, 2012   Gout    Hypertension    Hypokalemia    October, 2012   Murmur    No significant valvular disease by echo, January, 2012   Pacemaker    January, 2013 Medtronic Sensa   Pulmonary hypertension (HCC)     46 mmHg... echo... January, 2012    Past Surgical History:  Procedure Laterality Date    ABDOMINAL AORTOGRAM W/LOWER EXTREMITY Left 12/23/2022   Procedure: ABDOMINAL AORTOGRAM W/LOWER EXTREMITY;  Surgeon: Carlene Che, MD;  Location: [facility] INVASIVE CV LAB;  Service: Cardiovascular;  Laterality: Left;   AMPUTATION Left 12/26/2022   Procedure: AMPUTATION ABOVE KNEE;  Surgeon: Carlene Che, MD;  Location: [facility] OR;  Service: Vascular;  Laterality: Left;   APPLICATION OF WOUND VAC Left 12/26/2022   Procedure: APPLICATION OF WOUND VAC;  Surgeon: Carlene Che, MD;  Location: [facility] OR;  Service: Vascular;  Laterality: Left;   CHOLECYSTECTOMY     HIP ARTHROPLASTY Right    INSERT / REPLACE / REMOVE PACEMAKER  11/2011   PARTIAL HYSTERECTOMY     vaginal   PERMANENT PACEMAKER INSERTION N/A 12/09/2011   Procedure: PERMANENT PACEMAKER INSERTION;  Surgeon: Tammie Fall, MD;  Location: [facility] CATH LAB;  Service: Cardiovascular;  Laterality: N/A;   PPM GENERATOR CHANGEOUT N/A 01/03/2022   Procedure: PPM GENERATOR CHANGEOUT;  Surgeon: Tammie Fall, MD;  Location: [facility] INVASIVE CV LAB;  Service: Cardiovascular;  Laterality: N/A;    Current Outpatient Medications  Medication Sig Dispense Refill   acetaminophen  (TYLENOL ) 325 MG tablet Take 2 tablets (650 mg total) by mouth every 6 (six) hours as needed for mild pain (pain score 1-3). 40 tablet 0   albuterol (VENTOLIN HFA) 108 (  90 Base) MCG/ACT inhaler Inhale 2 puffs into the lungs every 6 (six) hours as needed for wheezing or shortness of breath.     allopurinol  (ZYLOPRIM ) 100 MG tablet Take by mouth.     Amino Acids-Protein Hydrolys (PRO-STAT AWC) LIQD Take 30 mLs by mouth daily.     ammonium lactate (AMLACTIN) 12 % cream Apply 1 Application topically in the morning and at bedtime.     ascorbic acid (VITAMIN C) 500 MG tablet Take 500 mg by mouth 2 (two) times daily.     Biotin  5000 MCG CAPS Take 5,000 mcg by mouth daily. (0900)     carvedilol  (COREG ) 6.25 MG tablet Take 6.25 mg by mouth 2 (two) times daily with a meal. (0900 & 1700)     cetirizine  (ZYRTEC) 10 MG tablet Take 10 mg by mouth daily.     Cholecalciferol  (VITAMIN D ) 50 MCG (2000 UT) CAPS Take 2,000 Units by mouth in the morning. (0900)     Cranberry 360 MG CAPS Take by mouth.     cyclobenzaprine  (FLEXERIL ) 5 MG tablet Take 1 tablet (5 mg total) by mouth 3 (three) times daily as needed for muscle spasms. 30 tablet 0   estradiol  (ESTRACE ) 0.1 MG/GM vaginal cream Discard plastic applicator. Insert a blueberry size amount (approximately 1 gram) of cream on gloved fingertip inside vagina at bedtime every night. For long term use. 42.5 g 5   ferrous sulfate 325 (65 FE) MG EC tablet Take 325 mg by mouth 3 (three) times daily with meals.     furosemide  (LASIX ) 10 MG/ML solution Take by mouth daily.     gabapentin  (NEURONTIN ) 400 MG capsule Take 1 capsule (400 mg total) by mouth 2 (two) times daily. (Patient not taking: Reported on 03/08/2024) 60 capsule 0   guaiFENesin -dextromethorphan (ROBITUSSIN DM) 100-10 MG/5ML syrup Take 5 mLs by mouth every 4 (four) hours as needed for cough.     Magnesium  250 MG TABS Take 1 tablet by mouth daily.     melatonin 3 MG TABS tablet Take 3 mg by mouth at bedtime. (2100)     memantine (NAMENDA) 5 MG tablet Take 5 mg by mouth 2 (two) times daily.     Menthol, Topical Analgesic, (BIOFREEZE COLORLESS EX) Apply 1 application  topically in the morning, at noon, and at bedtime. for neck pain     metolazone  (ZAROXOLYN ) 2.5 MG tablet Take 2.5 mg by mouth every other day.     Multiple Vitamins-Minerals (CERTA PLUS) TABS Take by mouth.     nitroGLYCERIN  (NITROSTAT ) 0.4 MG SL tablet Place 0.4 mg under the tongue every 5 (five) minutes as needed for chest pain (X 3 DOSES FOR CHEST PAIN).     oxyCODONE  (OXY IR/ROXICODONE ) 5 MG immediate release tablet Take 1 tablet (5 mg total) by mouth every 4 (four) hours as needed for moderate pain or severe pain (1 tablet for pain 4-6, 2 tablet for pain 7-10). 20 tablet 0   pantoprazole  (PROTONIX ) 40 MG tablet Take 40 mg by mouth  daily.     polyethylene glycol powder (GLYCOLAX /MIRALAX ) powder Take 17 g by mouth daily. (0900)     potassium chloride  (KLOR-CON ) 10 MEQ tablet Take 10 mEq by mouth daily.     pregabalin (LYRICA) 25 MG capsule Take 25 mg by mouth 2 (two) times daily.     rosuvastatin (CRESTOR) 40 MG tablet Take 40 mg by mouth daily.     sitaGLIPtin (JANUVIA) 50 MG tablet Take 50 mg by  mouth in the morning. (0900)     sucralfate (CARAFATE) 1 g tablet Take 1 g by mouth 4 (four) times daily -  with meals and at bedtime.     No current facility-administered medications for this visit.    Allergies:   Aspirin , Tramadol , Naproxen, and Shellfish-derived products   Social History:  The patient  reports that she has never smoked. She has never used smokeless tobacco. She reports that she does not drink alcohol and does not use drugs.   Family History:  The patient's family history includes Cancer in her son; Cardiomyopathy in her father; Coronary artery disease in her son; Dementia in her mother; Diabetes in her father; Heart failure in her father.  ROS:  Please see the history of present illness.    All other systems are reviewed and otherwise negative.   PHYSICAL EXAM:  VS:  There were no vitals taken for this visit. BMI: There is no height or weight on file to calculate BMI. Well nourished, well developed, in no acute distress  HEENT: normocephalic, atraumatic  Neck: no JVD, carotid bruits or masses Cardiac: ***  RRR; no significant murmurs, no rubs, or gallops Lungs: *** CTA b/l, no wheezing, rhonchi or rales  Abd: soft, nontender, obese MS: no deformity or atrophy Ext: *** 1++ edema, slight erythema (no heat) R>L  Skin: warm and dry, no rash Neuro:  No gross deficits appreciated Psych: euthymic mood, full affect  *** PPM site is stable, no tethering or discomfort   EKG:  Done today and reviewed by myself  ***  PPM check done today and reviewed by myself *** Battery and lead measurements are  good She is pacer dependent today ***   08/13/2015: TTE Study Conclusions - Left ventricle: The cavity size was normal. Wall thickness was   increased in a pattern of mild LVH. Systolic function was   vigorous. The estimated ejection fraction was in the range of 65%   to 70%. Cannot determine diastolic function in setting of   tachycardia. - Aortic valve: Mildly calcified annulus. Trileaflet; mildly   thickened leaflets. Valve area (VTI): 1.88 cm^2. Valve area   (Vmax): 2.09 cm^2. - Mitral valve: Mildly calcified annulus. Mildly thickened leaflets   . - Atrial septum: No defect or patent foramen ovale was identified. - Technically difficult study.    Recent Labs: 05/08/2023: ALT 10 05/30/2023: BUN 50; Creatinine, Ser 1.57; Hemoglobin 10.5; Platelets 321; Potassium 2.9; Sodium 132 12/20/2023: TSH 2.880  No results found for requested labs within last 365 days.   CrCl cannot be calculated (Patient's most recent lab result is older than the maximum 21 days allowed.).   Wt Readings from Last 3 Encounters:  03/08/24 175 lb (79.4 kg)  10/12/23 175 lb (79.4 kg)  09/05/23 171 lb (77.6 kg)     Other studies reviewed: Additional studies/records reviewed today include: summarized above  ASSESSMENT AND PLAN:  1. PPM     *** Intact function,      ***no programming changes made  2. HTN     *** Looks OK       3. Chronic CHF (diastolic) 4. Chronic venous stasis     *** + edema, sounds chronic, she denies SOB, is non-ambulatory     C/w cards team    Disposition: ***   Current medicines are reviewed at length with the patient today.  The patient did not have any concerns regarding medicines.  [place], PA-C 04/29/2024 1:53 PM  [facility] [address] [place] [address] [phone] (office)  [phone] (fax)

## 2024-05-02 ENCOUNTER — Ambulatory Visit: Payer: Medicare Other | Admitting: Physician Assistant

## 2024-05-22 NOTE — Progress Notes (Signed)
 Remote pacemaker transmission.

## 2024-05-22 NOTE — Addendum Note (Signed)
 Addended by: TAWNI DRILLING D on: 05/22/2024 11:53 AM   Modules accepted: Orders

## 2024-06-01 ENCOUNTER — Emergency Department (HOSPITAL_COMMUNITY)
Admission: EM | Admit: 2024-06-01 | Discharge: 2024-06-02 | Disposition: A | Discharge status: Skilled Nursing Facility | Source: Skilled Nursing Facility | Attending: Emergency Medicine | Admitting: Emergency Medicine

## 2024-06-01 ENCOUNTER — Emergency Department (HOSPITAL_COMMUNITY)

## 2024-06-01 DIAGNOSIS — M109 Gout, unspecified: Secondary | ICD-10-CM

## 2024-06-01 DIAGNOSIS — M10041 Idiopathic gout, right hand: Secondary | ICD-10-CM | POA: Insufficient documentation

## 2024-06-01 DIAGNOSIS — M79641 Pain in right hand: Secondary | ICD-10-CM | POA: Diagnosis present

## 2024-06-01 LAB — CBC WITH DIFFERENTIAL/PLATELET
Abs Immature Granulocytes: 0.03 K/uL (ref 0.00–0.07)
Basophils Absolute: 0 K/uL (ref 0.0–0.1)
Basophils Relative: 0 %
Eosinophils Absolute: 0.3 K/uL (ref 0.0–0.5)
Eosinophils Relative: 3 %
HCT: 31.5 % — ABNORMAL LOW (ref 36.0–46.0)
Hemoglobin: 10.2 g/dL — ABNORMAL LOW (ref 12.0–15.0)
Immature Granulocytes: 0 %
Lymphocytes Relative: 10 %
Lymphs Abs: 1 K/uL (ref 0.7–4.0)
MCH: 27.6 pg (ref 26.0–34.0)
MCHC: 32.4 g/dL (ref 30.0–36.0)
MCV: 85.4 fL (ref 80.0–100.0)
Monocytes Absolute: 1 K/uL (ref 0.1–1.0)
Monocytes Relative: 10 %
Neutro Abs: 7.4 K/uL (ref 1.7–7.7)
Neutrophils Relative %: 77 %
Platelets: 193 K/uL (ref 150–400)
RBC: 3.69 MIL/uL — ABNORMAL LOW (ref 3.87–5.11)
RDW: 17.2 % — ABNORMAL HIGH (ref 11.5–15.5)
WBC: 9.7 K/uL (ref 4.0–10.5)
nRBC: 0 % (ref 0.0–0.2)

## 2024-06-01 LAB — BASIC METABOLIC PANEL WITH GFR
Anion gap: 11 (ref 5–15)
BUN: 16 mg/dL (ref 8–23)
CO2: 24 mmol/L (ref 22–32)
Calcium: 9.3 mg/dL (ref 8.9–10.3)
Chloride: 100 mmol/L (ref 98–111)
Creatinine, Ser: 0.74 mg/dL (ref 0.44–1.00)
GFR, Estimated: >60 mL/min (ref >60)
Glucose, Bld: 102 mg/dL — ABNORMAL HIGH (ref 70–99)
Potassium: 4.2 mmol/L (ref 3.5–5.1)
Sodium: 135 mmol/L (ref 135–145)

## 2024-06-01 MED ORDER — COLCHICINE 0.6 MG PO TABS
1.2000 mg | ORAL_TABLET | ORAL | Status: AC
  Administered 2024-06-01: 1.2 mg via ORAL
  Filled 2024-06-01: qty 2

## 2024-06-01 MED ORDER — MIDAZOLAM HCL (PF) 10 MG/2ML IJ SOLN
INTRAMUSCULAR | Status: AC
  Filled 2024-06-01: qty 2

## 2024-06-01 MED ORDER — OXYCODONE-ACETAMINOPHEN 5-325 MG PO TABS
1.0000 | ORAL_TABLET | Freq: Once | ORAL | Status: AC
  Administered 2024-06-01: 1 via ORAL
  Filled 2024-06-01: qty 1

## 2024-06-01 MED ORDER — MIDAZOLAM HCL 2 MG/2ML IJ SOLN
INTRAMUSCULAR | Status: AC
  Filled 2024-06-01: qty 2

## 2024-06-01 MED ORDER — SUCCINYLCHOLINE CHLORIDE 200 MG/10ML IV SOSY
PREFILLED_SYRINGE | INTRAVENOUS | Status: DC
Start: 2024-06-01 — End: 2024-06-01
  Filled 2024-06-01: qty 10

## 2024-06-01 MED ORDER — COLCHICINE 0.6 MG PO TABS: 0.6000 mg | ORAL_TABLET | Freq: Two times a day (BID) | ORAL | 0 refills | Status: DC

## 2024-06-01 MED ORDER — ETOMIDATE 2 MG/ML IV SOLN
INTRAVENOUS | Status: DC
Start: 2024-06-01 — End: 2024-06-01
  Filled 2024-06-01: qty 10

## 2024-06-01 NOTE — ED Notes (Signed)
 Pt has been placed on the convo list back to [facility]

## 2024-06-01 NOTE — ED Triage Notes (Signed)
 Pt arrives via EMS from [facility] with reports of right hand and wrist pain swelling. EMS was told by staff that they recently tried to get labs from pt on the hand. Pt alert to self, place and situation.

## 2024-06-01 NOTE — ED Provider Notes (Signed)
 [place] EMERGENCY DEPARTMENT AT [facility] Provider Note   CSN: [id number] Arrival date & time: 06/01/24  1538     Patient presents with: Hand Pain   [name] is a 89 y.o. female.    Hand Pain   This patient is a 89 year old female coming from a nursing facility locally.  She does have a history of acute gouty arthritis and takes allopurinol .  Over the last several days she has had increasing pain and swelling of the right hand extending into the right second finger.  Evidently there was some type of needlestick that occurred and there was concern that this occurred after a needlestick per the per report of EMS.  She was given 1 oxycodone  prior to transport.  She does not have any fevers or any other symptoms.  She has already had a left lower extremity above-the-knee amputation in the past and has known phantom pain with.  The patient has history of gout in that leg    Prior to Admission medications   Medication Sig Start Date End Date Taking? Authorizing Provider  colchicine  0.6 MG tablet Take 1 tablet (0.6 mg total) by mouth 2 (two) times daily. 06/01/24  Yes Cleotilde Rogue, MD  acetaminophen  (TYLENOL ) 325 MG tablet Take 2 tablets (650 mg total) by mouth every 6 (six) hours as needed for mild pain (pain score 1-3). 10/13/23   Raford Lenis, MD  albuterol (VENTOLIN HFA) 108 (90 Base) MCG/ACT inhaler Inhale 2 puffs into the lungs every 6 (six) hours as needed for wheezing or shortness of breath.    [provider]  allopurinol  (ZYLOPRIM ) 100 MG tablet Take by mouth.    [provider]  Amino Acids-Protein Hydrolys (PRO-STAT AWC) LIQD Take 30 mLs by mouth daily.    [provider]  ammonium lactate (AMLACTIN) 12 % cream Apply 1 Application topically in the morning and at bedtime.    [provider]  ascorbic acid (VITAMIN C) 500 MG tablet Take 500 mg by mouth 2 (two) times daily.    [provider]  Biotin  5000 MCG CAPS Take  5,000 mcg by mouth daily. (0900)    [provider]  carvedilol  (COREG ) 6.25 MG tablet Take 6.25 mg by mouth 2 (two) times daily with a meal. (0900 & 1700)    [provider]  cetirizine (ZYRTEC) 10 MG tablet Take 10 mg by mouth daily.    [provider]  Cholecalciferol  (VITAMIN D ) 50 MCG (2000 UT) CAPS Take 2,000 Units by mouth in the morning. (0900)    [provider]  Cranberry 360 MG CAPS Take by mouth.    [provider]  cyclobenzaprine  (FLEXERIL ) 5 MG tablet Take 1 tablet (5 mg total) by mouth 3 (three) times daily as needed for muscle spasms. 10/13/23   Raford Lenis, MD  estradiol  (ESTRACE ) 0.1 MG/GM vaginal cream Discard plastic applicator. Insert a blueberry size amount (approximately 1 gram) of cream on gloved fingertip inside vagina at bedtime every night. For long term use. 09/05/23   Gerldine Lauraine BROCKS, FNP  ferrous sulfate 325 (65 FE) MG EC tablet Take 325 mg by mouth 3 (three) times daily with meals.    [provider]  furosemide  (LASIX ) 10 MG/ML solution Take by mouth daily.    [provider]  gabapentin  (NEURONTIN ) 400 MG capsule Take 1 capsule (400 mg total) by mouth 2 (two) times daily. Patient not taking: Reported on 03/08/2024 12/31/22 08/31/23  Raenelle Coria, MD  guaiFENesin -dextromethorphan (ROBITUSSIN DM) 100-10 MG/5ML syrup Take 5 mLs by mouth every 4 (four) hours as needed for cough.    [provider]  Magnesium  250 MG TABS Take 1 tablet by mouth daily.    [provider]  melatonin 3 MG TABS tablet Take 3 mg by mouth at bedtime. (2100)    [provider]  memantine (NAMENDA) 5 MG tablet Take 5 mg by mouth 2 (two) times daily.    [provider]  Menthol, Topical Analgesic, (BIOFREEZE COLORLESS EX) Apply 1 application  topically in the morning, at noon, and at bedtime. for neck pain    [provider]  metolazone  (ZAROXOLYN ) 2.5 MG tablet Take 2.5 mg by mouth every  other day.    [provider]  Multiple Vitamins-Minerals (CERTA PLUS) TABS Take by mouth.    [provider]  nitroGLYCERIN  (NITROSTAT ) 0.4 MG SL tablet Place 0.4 mg under the tongue every 5 (five) minutes as needed for chest pain (X 3 DOSES FOR CHEST PAIN).    [provider]  oxyCODONE  (OXY IR/ROXICODONE ) 5 MG immediate release tablet Take 1 tablet (5 mg total) by mouth every 4 (four) hours as needed for moderate pain or severe pain (1 tablet for pain 4-6, 2 tablet for pain 7-10). 12/31/22   Raenelle Coria, MD  pantoprazole  (PROTONIX ) 40 MG tablet Take 40 mg by mouth daily.    [provider]  polyethylene glycol powder (GLYCOLAX /MIRALAX ) powder Take 17 g by mouth daily. (0900)    [provider]  potassium chloride  (KLOR-CON ) 10 MEQ tablet Take 10 mEq by mouth daily.    [provider]  pregabalin (LYRICA) 25 MG capsule Take 25 mg by mouth 2 (two) times daily.    [provider]  rosuvastatin (CRESTOR) 40 MG tablet Take 40 mg by mouth daily.    [provider]  sitaGLIPtin (JANUVIA) 50 MG tablet Take 50 mg by mouth in the morning. (0900)    [provider]  sucralfate (CARAFATE) 1 g tablet Take 1 g by mouth 4 (four) times daily -  with meals and at bedtime.    [provider]    Allergies: Aspirin , Tramadol , Naproxen, and Shellfish-derived products    Review of Systems  All other systems reviewed and are negative.   Updated Vital Signs BP (!) 187/80   Pulse 60   Temp 98.5 F (36.9 C)   Resp 18   Ht 1.499 m (4' 11)   Wt 79 kg   SpO2 96%   BMI 35.18 kg/m   Physical Exam Vitals and nursing note reviewed.  Constitutional:      General: She is not in acute distress.    Appearance: She is well-developed.  HENT:     Head: Normocephalic and atraumatic.     Mouth/Throat:     Pharynx: No oropharyngeal exudate.  Eyes:     General: No scleral icterus.       Right eye: No discharge.        Left  eye: No discharge.     Conjunctiva/sclera: Conjunctivae normal.     Pupils: Pupils are equal, round, and reactive to light.  Neck:     Thyroid : No thyromegaly.     Vascular: No JVD.  Cardiovascular:     Rate and Rhythm: Normal rate and regular rhythm.     Heart sounds: Normal heart sounds. No murmur heard.    No friction rub. No gallop.  Pulmonary:  Effort: Pulmonary effort is normal. No respiratory distress.     Breath sounds: Normal breath sounds. No wheezing or rales.  Abdominal:     General: Bowel sounds are normal. There is no distension.     Palpations: Abdomen is soft. There is no mass.     Tenderness: There is no abdominal tenderness.  Musculoskeletal:        General: No tenderness. Normal range of motion.     Cervical back: Normal range of motion and neck supple.     Comments: Edema of the right lower extremity, left lower extremity with stump intact, above-the-knee amputation.  Right upper extremity with edema of the hand extending into the right second finger, she has good range of motion of the 3rd, 4th and 5th fingers as well as the thumb but the second finger has tenderness with range of motion.  It is mildly swollen red and warm near the MCP joint  Lymphadenopathy:     Cervical: No cervical adenopathy.  Skin:    General: Skin is warm and dry.     Findings: No erythema or rash.  Neurological:     Mental Status: She is alert.     Coordination: Coordination normal.  Psychiatric:        Behavior: Behavior normal.     (all labs ordered are listed, but only abnormal results are displayed) Labs Reviewed  CBC WITH DIFFERENTIAL/PLATELET  BASIC METABOLIC PANEL WITH GFR    EKG: None  Radiology: DG Hand Complete Right Result Date: 06/01/2024 CLINICAL DATA:  swellingk hand and second finger EXAM: RIGHT HAND - COMPLETE 3+ VIEW; RIGHT INDEX FINGER 2+V COMPARISON:  December 19, 2004 FINDINGS: Osteopenia. There has been progressive widening of the scapholunate interval  with progressive osseous remodeling of the carpal bones compared to remote prior. Advanced degenerative changes of the first [facility] with joint space narrowing and osteophyte formation. Subcortical cysts are noted throughout the carpal bones and along the radial head. There is chondrocalcinosis throughout the radiocarpal joint. Multiple areas of chondrocalcinosis versus periarticular soft tissue calcifications are noted, most predominant in the radiocarpal joint, fifth MCP, second MCP and second PIP. There is associated soft tissue density and swelling in these areas. No definitive acute fracture. IMPRESSION: 1. No definitive acute fracture. 2. Progressive widening of the scapholunate interval with progressive osseous remodeling of the carpal bones compared to remote prior. 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 3. Multiple areas of chondrocalcinosis versus periarticular soft tissue calcifications are noted, most predominant in the radiocarpal joint, fifth MCP, second MCP and second PIP. There is associated soft tissue density and swelling in these areas. Findings are nonspecific but could reflect CPPD arthropathy. Electronically Signed   By: Corean Salter M.D.   On: 06/01/2024 16:48   DG Finger Index Right Result Date: 06/01/2024 CLINICAL DATA:  swellingk hand and second finger EXAM: RIGHT HAND - COMPLETE 3+ VIEW; RIGHT INDEX FINGER 2+V COMPARISON:  December 19, 2004 FINDINGS: Osteopenia. There has been progressive widening of the scapholunate interval with progressive osseous remodeling of the carpal bones compared to remote prior. Advanced degenerative changes of the first [facility] with joint space narrowing and osteophyte formation. Subcortical cysts are noted throughout the carpal bones and along the radial head. There is chondrocalcinosis throughout the radiocarpal joint. Multiple areas of chondrocalcinosis versus periarticular soft tissue calcifications are noted, most predominant in the radiocarpal joint,  fifth MCP, second MCP and second PIP. There is associated soft tissue density and swelling in these areas. No definitive  acute fracture. IMPRESSION: 1. No definitive acute fracture. 2. Progressive widening of the scapholunate interval with progressive osseous remodeling of the carpal bones compared to remote prior. 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 3. Multiple areas of chondrocalcinosis versus periarticular soft tissue calcifications are noted, most predominant in the radiocarpal joint, fifth MCP, second MCP and second PIP. There is associated soft tissue density and swelling in these areas. Findings are nonspecific but could reflect CPPD arthropathy. Electronically Signed   By: Corean Salter M.D.   On: 06/01/2024 16:48     Procedures   Medications Ordered in the ED  oxyCODONE -acetaminophen  (PERCOCET/ROXICET) 5-325 MG per tablet 1 tablet (has no administration in time range)  colchicine  tablet 1.2 mg (1.2 mg Oral Given 06/01/24 1622)                                    Medical Decision Making Amount and/or Complexity of Data Reviewed Labs: ordered. Radiology: ordered.  Risk Prescription drug management.    This patient presents to the ED for concern of finger swelling differential diagnosis includes gout, less likely to be infection, less likely to be ischemic, she has good capillary refill distal to the swelling of the tip of the finger    Additional history obtained   Additional history obtained from Electronic Medical Record External records from outside source obtained and reviewed including medical record including the documents accompanying the patient from the nursing facility and her medication administration record, evidently they have been putting ice on this for the last couple of days     Imaging Studies ordered:  I ordered imaging studies including x-ray of the right hand and finger I independently visualized and interpreted imaging which showed advanced  degenerative disease of the scapholunate area, multiple areas of chondrocalcinosis consistent with likely arthropathy I agree with the radiologist interpretation   Medicines ordered and prescription drug management:  I ordered medication including colchicine  and oxycodone     I have reviewed the patients home medicines and have made adjustments as needed   Problem List / ED Course:  Benign appearance, no signs of infection, stable for discharge, supportive care   Social Determinants of Health:  Nursing home        Final diagnoses:  Acute gout of right hand, unspecified cause    ED Discharge Orders          Ordered    colchicine  0.6 MG tablet  2 times daily        06/01/24 1759               Cleotilde Rogue, MD 06/01/24 1800

## 2024-06-01 NOTE — ED Notes (Signed)
 Report called to [facility]. Spoke with [facility] nurse. Kellogg RN

## 2024-06-01 NOTE — Discharge Instructions (Signed)
 Your testing shows that you probably have gout causing the swelling and inflammation of your hand.  The medicine called colchicine  should be taken twice a day and should help treat this however usually the pain will last for several days or up to even a week.  Please stop the allopurinol  until you have completed the medication called colchicine  and until the pain and swelling is gone down.  You should seek medical exam for severe or worsening symptoms including fever, have your doctor recheck you within 3 days at your nursing facility.  The x-ray shows advanced degeneration of your wrist and hand consistent with severe arthritis

## 2024-06-02 DIAGNOSIS — M10041 Idiopathic gout, right hand: Secondary | ICD-10-CM | POA: Diagnosis not present

## 2024-06-02 LAB — CBG MONITORING, ED: Glucose-Capillary: 94 mg/dL (ref 70–99)

## 2024-06-02 MED ORDER — OXYCODONE-ACETAMINOPHEN 5-325 MG PO TABS
1.0000 | ORAL_TABLET | Freq: Once | ORAL | Status: AC
  Administered 2024-06-02: 1 via ORAL
  Filled 2024-06-02: qty 1

## 2024-06-02 NOTE — Progress Notes (Unsigned)
 Cardiology Office Note Date:  06/02/2024  Patient ID:  [name], [name] 1931/07/03, MRN [id number] PCP:  [name]  Cardiologist:  Dr. Dann (inactive) Electrophysiologist: Dr. Waddell     Chief Complaint: *** annual device visit  History of Present Illness: [name] is a 89 y.o. female with history of  DM, HLD, HTN, gouty arthritis chronic, lower extremity venous stasis and cellulitis,  PVD s/p left AKA chronic CHF (diastolic), chronic O2 use (prn) Advanced heart block w/PPM  She saw Dr. Waddell 05/02/23, post gen change, doing well, device functioning normally  She saw cards APP 03/08/24, living at a SNF, wheelchair bound, diuretics had recently been adjusted, unclear what prompted that. She was edematous and planned ofr lab/diuretic adjustment - Unable to add MRA with renal insufficiency - No SGLT2 given wheelchair bound status and recurrent UTI. Planned to establish care w/Dr. Jeffrie who she had seen in the past as well  ER visit 06/01/24, hand pain, suspected gout no signs of infection > colchicine   TODAY  *** device only *** volume w/cards *** I have seen her a few years ago  Device information MDT dual chamber PPM, implanted 12/09/2011, gen change 01/03/22 DEPENDENT  Past Medical History:  Diagnosis Date   Arthritis    qwhere   AV block     2:1  January 14, 2011 / pacemaker plan when cellulitis resolved in length   Bradycardia    2:1  AV block   Cellulitis    Superficial cellulitis right lower leg   January 14, 2011   Chronic diastolic CHF (congestive heart failure) (HCC)    Diabetes mellitus without complication (HCC)    Edema    . EF 65-70%, echo, November 29, 2010   Ejection fraction    EF 65%, echo, January, 2012   Gout    Hypertension    Hypokalemia    October, 2012   Murmur    No significant valvular disease by echo, January, 2012   Pacemaker    January, 2013 Medtronic Sensa   Pulmonary hypertension (HCC)     46 mmHg...  echo... January, 2012    Past Surgical History:  Procedure Laterality Date   ABDOMINAL AORTOGRAM W/LOWER EXTREMITY Left 12/23/2022   Procedure: ABDOMINAL AORTOGRAM W/LOWER EXTREMITY;  Surgeon: [name] Debby SAILOR, MD;  Location: [facility] INVASIVE CV LAB;  Service: Cardiovascular;  Laterality: Left;   AMPUTATION Left 12/26/2022   Procedure: AMPUTATION ABOVE KNEE;  Surgeon: [name] Debby SAILOR, MD;  Location: [facility] OR;  Service: Vascular;  Laterality: Left;   APPLICATION OF WOUND VAC Left 12/26/2022   Procedure: APPLICATION OF WOUND VAC;  Surgeon: [name] Debby SAILOR, MD;  Location: [facility] OR;  Service: Vascular;  Laterality: Left;   CHOLECYSTECTOMY     HIP ARTHROPLASTY Right    INSERT / REPLACE / REMOVE PACEMAKER  11/2011   PARTIAL HYSTERECTOMY     vaginal   PERMANENT PACEMAKER INSERTION N/A 12/09/2011   Procedure: PERMANENT PACEMAKER INSERTION;  Surgeon: [name] Waddell, MD;  Location: [facility] CATH LAB;  Service: Cardiovascular;  Laterality: N/A;   PPM GENERATOR CHANGEOUT N/A 01/03/2022   Procedure: PPM GENERATOR CHANGEOUT;  Surgeon: [name] [name] LELON, MD;  Location: [facility] INVASIVE CV LAB;  Service: Cardiovascular;  Laterality: N/A;    Current Outpatient Medications  Medication Sig Dispense Refill   acetaminophen  (TYLENOL ) 325 MG tablet Take 2 tablets (650 mg total) by mouth every 6 (six) hours as needed for mild pain (pain score 1-3). 40 tablet  0   albuterol (VENTOLIN HFA) 108 (90 Base) MCG/ACT inhaler Inhale 2 puffs into the lungs every 6 (six) hours as needed for wheezing or shortness of breath.     allopurinol  (ZYLOPRIM ) 100 MG tablet Take by mouth.     Amino Acids-Protein Hydrolys (PRO-STAT AWC) LIQD Take 30 mLs by mouth daily.     ammonium lactate (AMLACTIN) 12 % cream Apply 1 Application topically in the morning and at bedtime.     ascorbic acid (VITAMIN C) 500 MG tablet Take 500 mg by mouth 2 (two) times daily.     Biotin  5000 MCG CAPS Take 5,000 mcg by mouth daily. (0900)     carvedilol  (COREG ) 6.25 MG tablet Take  6.25 mg by mouth 2 (two) times daily with a meal. (0900 & 1700)     cetirizine (ZYRTEC) 10 MG tablet Take 10 mg by mouth daily.     Cholecalciferol  (VITAMIN D ) 50 MCG (2000 UT) CAPS Take 2,000 Units by mouth in the morning. (0900)     colchicine  0.6 MG tablet Take 1 tablet (0.6 mg total) by mouth 2 (two) times daily. 8 tablet 0   Cranberry 360 MG CAPS Take by mouth.     cyclobenzaprine  (FLEXERIL ) 5 MG tablet Take 1 tablet (5 mg total) by mouth 3 (three) times daily as needed for muscle spasms. 30 tablet 0   estradiol  (ESTRACE ) 0.1 MG/GM vaginal cream Discard plastic applicator. Insert a blueberry size amount (approximately 1 gram) of cream on gloved fingertip inside vagina at bedtime every night. For long term use. 42.5 g 5   ferrous sulfate 325 (65 FE) MG EC tablet Take 325 mg by mouth 3 (three) times daily with meals.     furosemide  (LASIX ) 10 MG/ML solution Take by mouth daily.     gabapentin  (NEURONTIN ) 400 MG capsule Take 1 capsule (400 mg total) by mouth 2 (two) times daily. (Patient not taking: Reported on 03/08/2024) 60 capsule 0   guaiFENesin -dextromethorphan (ROBITUSSIN DM) 100-10 MG/5ML syrup Take 5 mLs by mouth every 4 (four) hours as needed for cough.     Magnesium  250 MG TABS Take 1 tablet by mouth daily.     melatonin 3 MG TABS tablet Take 3 mg by mouth at bedtime. (2100)     memantine (NAMENDA) 5 MG tablet Take 5 mg by mouth 2 (two) times daily.     Menthol, Topical Analgesic, (BIOFREEZE COLORLESS EX) Apply 1 application  topically in the morning, at noon, and at bedtime. for neck pain     metolazone  (ZAROXOLYN ) 2.5 MG tablet Take 2.5 mg by mouth every other day.     Multiple Vitamins-Minerals (CERTA PLUS) TABS Take by mouth.     nitroGLYCERIN  (NITROSTAT ) 0.4 MG SL tablet Place 0.4 mg under the tongue every 5 (five) minutes as needed for chest pain (X 3 DOSES FOR CHEST PAIN).     oxyCODONE  (OXY IR/ROXICODONE ) 5 MG immediate release tablet Take 1 tablet (5 mg total) by mouth every 4  (four) hours as needed for moderate pain or severe pain (1 tablet for pain 4-6, 2 tablet for pain 7-10). 20 tablet 0   pantoprazole  (PROTONIX ) 40 MG tablet Take 40 mg by mouth daily.     polyethylene glycol powder (GLYCOLAX /MIRALAX ) powder Take 17 g by mouth daily. (0900)     potassium chloride  (KLOR-CON ) 10 MEQ tablet Take 10 mEq by mouth daily.     pregabalin (LYRICA) 25 MG capsule Take 25 mg by mouth 2 (two) times daily.  rosuvastatin (CRESTOR) 40 MG tablet Take 40 mg by mouth daily.     sitaGLIPtin (JANUVIA) 50 MG tablet Take 50 mg by mouth in the morning. (0900)     sucralfate (CARAFATE) 1 g tablet Take 1 g by mouth 4 (four) times daily -  with meals and at bedtime.     No current facility-administered medications for this visit.    Allergies:   Aspirin , Tramadol , Naproxen, and Shellfish-derived products   Social History:  The patient  reports that she has never smoked. She has never used smokeless tobacco. She reports that she does not drink alcohol and does not use drugs.   Family History:  The patient's family history includes Cancer in her son; Cardiomyopathy in her father; Coronary artery disease in her son; Dementia in her mother; Diabetes in her father; Heart failure in her father.  ROS:  Please see the history of present illness.    All other systems are reviewed and otherwise negative.   PHYSICAL EXAM:  VS:  There were no vitals taken for this visit. BMI: There is no height or weight on file to calculate BMI. Well nourished, well developed, in no acute distress  HEENT: normocephalic, atraumatic  Neck: no JVD, carotid bruits or masses Cardiac: *** RRR; no significant murmurs, no rubs, or gallops Lungs: ***  CTA b/l, no wheezing, rhonchi or rales  Abd: soft, nontender, obese MS: no deformity or atrophy Ext: *** 1++ edema, slight erythema (no heat) R>L  Skin: warm and dry, no rash Neuro:  No gross deficits appreciated Psych: euthymic mood, full affect  *** PPM site is  stable, no tethering or discomfort   EKG:  Done today and reviewed by myself shows  ***  PPM check done today and reviewed by myself *** Battery and lead measurements are good ***   08/13/2015: TTE Study Conclusions - Left ventricle: The cavity size was normal. Wall thickness was   increased in a pattern of mild LVH. Systolic function was   vigorous. The estimated ejection fraction was in the range of 65%   to 70%. Cannot determine diastolic function in setting of   tachycardia. - Aortic valve: Mildly calcified annulus. Trileaflet; mildly   thickened leaflets. Valve area (VTI): 1.88 cm^2. Valve area   (Vmax): 2.09 cm^2. - Mitral valve: Mildly calcified annulus. Mildly thickened leaflets   . - Atrial septum: No defect or patent foramen ovale was identified. - Technically difficult study.    Recent Labs: 12/20/2023: TSH 2.880 06/01/2024: BUN 16; Creatinine, Ser 0.74; Hemoglobin 10.2; Platelets 193; Potassium 4.2; Sodium 135  No results found for requested labs within last 365 days.   Estimated Creatinine Clearance: 42.4 mL/min (by C-G formula based on SCr of 0.74 mg/dL).   Wt Readings from Last 3 Encounters:  06/01/24 174 lb 2.6 oz (79 kg)  03/08/24 175 lb (79.4 kg)  10/12/23 175 lb (79.4 kg)     Other studies reviewed: Additional studies/records reviewed today include: summarized above  ASSESSMENT AND PLAN:  1. PPM     *** Intact function      *** no programming changes made  2. HTN     *** Looks OK      3. Chronic CHF (diastolic) 4. Chronic venous stasis     ***     C/w cards team    Disposition: remotes as usual, in clinci w/EP in ***, sooner if needed.    Current medicines are reviewed at length with the patient today.  The  patient did not have any concerns regarding medicines.  Bonney Charlies Arthur, PA-C 06/02/2024 9:47 AM     [facility] [address] [place] [address] [phone] (office)  [phone] (fax)

## 2024-06-02 NOTE — ED Notes (Addendum)
 Pt changed, brief and bed sheets changed, pt had one large BM, waiting on convo to facility

## 2024-06-03 ENCOUNTER — Telehealth: Payer: Self-pay | Admitting: Internal Medicine

## 2024-06-03 NOTE — Telephone Encounter (Signed)
 S/W pt's daughter, Raoul. She was concerned that pt's bp will be higher due to pain at her cardiology appt tomorrow. She was advised to keep the appointment, she agrees with plan.

## 2024-06-03 NOTE — Telephone Encounter (Signed)
 Daughter Verba) stated patient has been having bad gout pain in her right arm and was recently released from the hospital and wants to know if patient should still keep tomorrow's appointment.

## 2024-06-04 ENCOUNTER — Encounter: Payer: Self-pay | Admitting: Physician Assistant

## 2024-06-04 ENCOUNTER — Ambulatory Visit: Attending: Physician Assistant | Admitting: Physician Assistant

## 2024-06-04 VITALS — BP 130/74 | HR 74 | Ht 59.0 in | Wt 171.0 lb

## 2024-06-04 DIAGNOSIS — I5032 Chronic diastolic (congestive) heart failure: Secondary | ICD-10-CM | POA: Diagnosis not present

## 2024-06-04 DIAGNOSIS — I10 Essential (primary) hypertension: Secondary | ICD-10-CM | POA: Diagnosis not present

## 2024-06-04 DIAGNOSIS — Z950 Presence of cardiac pacemaker: Secondary | ICD-10-CM | POA: Diagnosis present

## 2024-06-04 LAB — CUP PACEART INCLINIC DEVICE CHECK
Battery Remaining Longevity: 88 mo
Battery Voltage: 3.01 V
Brady Statistic AP VP Percent: 45.91 %
Brady Statistic AP VS Percent: 0.01 %
Brady Statistic AS VP Percent: 53.99 %
Brady Statistic AS VS Percent: 0.09 %
Brady Statistic RA Percent Paced: 45.92 %
Brady Statistic RV Percent Paced: 99.9 %
Date Time Interrogation Session: 20250715142714
Implantable Lead Connection Status: 753985
Implantable Lead Connection Status: 753985
Implantable Lead Implant Date: 20130118
Implantable Lead Implant Date: 20130118
Implantable Lead Location: 753859
Implantable Lead Location: 753860
Implantable Lead Model: 5076
Implantable Lead Model: 5076
Implantable Pulse Generator Implant Date: 20230213
Lead Channel Impedance Value: 266 Ohm
Lead Channel Impedance Value: 342 Ohm
Lead Channel Impedance Value: 437 Ohm
Lead Channel Impedance Value: 494 Ohm
Lead Channel Pacing Threshold Amplitude: 1.375 V
Lead Channel Pacing Threshold Amplitude: 1.5 V
Lead Channel Pacing Threshold Pulse Width: 0.4 ms
Lead Channel Pacing Threshold Pulse Width: 0.4 ms
Lead Channel Sensing Intrinsic Amplitude: 0.625 mV
Lead Channel Sensing Intrinsic Amplitude: 0.625 mV
Lead Channel Sensing Intrinsic Amplitude: 3.375 mV
Lead Channel Sensing Intrinsic Amplitude: 3.375 mV
Lead Channel Setting Pacing Amplitude: 2.75 V
Lead Channel Setting Pacing Amplitude: 3 V
Lead Channel Setting Pacing Pulse Width: 0.4 ms
Lead Channel Setting Sensing Sensitivity: 2.8 mV
Zone Setting Status: 755011
Zone Setting Status: 755011

## 2024-06-04 NOTE — Patient Instructions (Signed)
 Medication Instructions:   Your physician recommends that you continue on your current medications as directed. Please refer to the Current Medication list given to you today.   *If you need a refill on your cardiac medications before your next appointment, please call your pharmacy*  Lab Work:  NONE ORDERED  TODAY   If you have labs (blood work) drawn today and your tests are completely normal, you will receive your results only by: MyChart Message (if you have MyChart) OR A paper copy in the mail If you have any lab test that is abnormal or we need to change your treatment, we will call you to review the results.  Testing/Procedures: NONE ORDERED  TODAY   Follow-Up: At [facility], you and your health needs are our priority.  As part of our continuing mission to provide you with exceptional heart care, our providers are all part of one team.  This team includes your primary Cardiologist (physician) and Advanced Practice Providers or APPs (Physician Assistants and Nurse Practitioners) who all work together to provide you with the care you need, when you need it.  Your next appointment:    6 month(s)   Provider:    You may see  or one of the following Advanced Practice Providers on your designated Care Team:   Charlies Arthur, [place]   We recommend signing up for the patient portal called MyChart.  Sign up information is provided on this After Visit Summary.  MyChart is used to connect with patients for Virtual Visits (Telemedicine).  Patients are able to view lab/test results, encounter notes, upcoming appointments, etc.  Non-urgent messages can be sent to your provider as well.   To learn more about what you can do with MyChart, go to [email or web address].   Other Instructions

## 2024-07-08 ENCOUNTER — Ambulatory Visit (INDEPENDENT_AMBULATORY_CARE_PROVIDER_SITE_OTHER): Payer: Medicare Other

## 2024-07-08 DIAGNOSIS — I442 Atrioventricular block, complete: Secondary | ICD-10-CM

## 2024-07-10 LAB — CUP PACEART REMOTE DEVICE CHECK
Battery Remaining Longevity: 21 mo
Battery Voltage: 2.99 V
Brady Statistic AP VP Percent: 32.29 %
Brady Statistic AP VS Percent: 0.01 %
Brady Statistic AS VP Percent: 67.48 %
Brady Statistic AS VS Percent: 0.22 %
Brady Statistic RA Percent Paced: 32.34 %
Brady Statistic RV Percent Paced: 99.77 %
Date Time Interrogation Session: 20250818012445
Implantable Lead Connection Status: 753985
Implantable Lead Connection Status: 753985
Implantable Lead Implant Date: 20130118
Implantable Lead Implant Date: 20130118
Implantable Lead Location: 753859
Implantable Lead Location: 753860
Implantable Lead Model: 5076
Implantable Lead Model: 5076
Implantable Pulse Generator Implant Date: 20230213
Lead Channel Impedance Value: 266 Ohm
Lead Channel Impedance Value: 342 Ohm
Lead Channel Impedance Value: 380 Ohm
Lead Channel Impedance Value: 589 Ohm
Lead Channel Pacing Threshold Amplitude: 1.25 V
Lead Channel Pacing Threshold Amplitude: 2.5 V
Lead Channel Pacing Threshold Pulse Width: 0.4 ms
Lead Channel Pacing Threshold Pulse Width: 0.4 ms
Lead Channel Sensing Intrinsic Amplitude: 0.875 mV
Lead Channel Sensing Intrinsic Amplitude: 0.875 mV
Lead Channel Sensing Intrinsic Amplitude: 3.375 mV
Lead Channel Sensing Intrinsic Amplitude: 3.375 mV
Lead Channel Setting Pacing Amplitude: 2.5 V
Lead Channel Setting Pacing Amplitude: 5 V
Lead Channel Setting Pacing Pulse Width: 1 ms
Lead Channel Setting Sensing Sensitivity: 2.8 mV
Zone Setting Status: 755011
Zone Setting Status: 755011

## 2024-07-14 ENCOUNTER — Ambulatory Visit: Payer: Self-pay | Admitting: Internal Medicine

## 2024-07-19 ENCOUNTER — Telehealth: Payer: Self-pay

## 2024-07-19 ENCOUNTER — Telehealth: Payer: Self-pay | Admitting: Internal Medicine

## 2024-07-19 ENCOUNTER — Ambulatory Visit: Attending: Cardiology

## 2024-07-19 DIAGNOSIS — I442 Atrioventricular block, complete: Secondary | ICD-10-CM | POA: Diagnosis not present

## 2024-07-19 DIAGNOSIS — Z950 Presence of cardiac pacemaker: Secondary | ICD-10-CM | POA: Insufficient documentation

## 2024-07-19 LAB — CUP PACEART INCLINIC DEVICE CHECK
Battery Remaining Longevity: 20 mo
Battery Voltage: 2.98 V
Brady Statistic AP VP Percent: 40.23 %
Brady Statistic AP VS Percent: 0.01 %
Brady Statistic AS VP Percent: 59.52 %
Brady Statistic AS VS Percent: 0.24 %
Brady Statistic RA Percent Paced: 40.29 %
Brady Statistic RV Percent Paced: 99.75 %
Date Time Interrogation Session: 20250829165911
Implantable Lead Connection Status: 753985
Implantable Lead Connection Status: 753985
Implantable Lead Implant Date: 20130118
Implantable Lead Implant Date: 20130118
Implantable Lead Location: 753859
Implantable Lead Location: 753860
Implantable Lead Model: 5076
Implantable Lead Model: 5076
Implantable Pulse Generator Implant Date: 20230213
Lead Channel Impedance Value: 285 Ohm
Lead Channel Impedance Value: 342 Ohm
Lead Channel Impedance Value: 418 Ohm
Lead Channel Impedance Value: 570 Ohm
Lead Channel Pacing Threshold Amplitude: 1 V
Lead Channel Pacing Threshold Amplitude: 2.25 V
Lead Channel Pacing Threshold Pulse Width: 0.4 ms
Lead Channel Pacing Threshold Pulse Width: 1 ms
Lead Channel Sensing Intrinsic Amplitude: 0.875 mV
Lead Channel Setting Pacing Amplitude: 2.75 V
Lead Channel Setting Pacing Amplitude: 5 V
Lead Channel Setting Pacing Pulse Width: 1 ms
Lead Channel Setting Sensing Sensitivity: 2.8 mV
Zone Setting Status: 755011
Zone Setting Status: 755011

## 2024-07-19 NOTE — Telephone Encounter (Signed)
 Alert remote transmission: High RV thresholds measured for at least 3 days starting on 19-Jul-2024. RV lead trends show increase since 06/04/24 in-clinic reprogramming session with High readings since ~ 07/03/24; High output mode of 5.00 V @ 1.00 ms.   Patient is dependent. Need to bring in and evaluate lead ASAP.  Spoke with nurse at facility [facility]) Rosina, she is going to have the director call me so we can coordinate a time today to see patient in clinic.

## 2024-07-19 NOTE — Patient Instructions (Addendum)
 Right ventricular pacemaker lead is requiring more energy to pace your heart compared with the last check. Pacemaker has been reprogrammed today to deliver the required energy.   Call 911 to go to the ER if you have any passing out or near passing out spells.  Return to the office on Thursday, 07/25/24, at 3:30pm for an appointment with Dr. Waddell.

## 2024-07-19 NOTE — Telephone Encounter (Signed)
 Spoke with Delon, pt's nurse at [facility]. Explained elevated RV threshold findings. Relayed appointment info. ED precautions provided for new/worsening symptoms, including near syncope/syncope. Delon verbalized understanding and denied any questions at this time.

## 2024-07-19 NOTE — Telephone Encounter (Signed)
 Spoke with patient's daughter, Raoul ([place]). Explained that RV lead is requiring more energy to pace than it did at previous checks. Output is programmed with appropriate safety margin. Per recommendation from Dr. Nancey, plan to have pt follow up with Dr. Waddell on 07/25/24 at 3:30pm. ED precautions provided and explained will call report to SNF. [name] expressed appreciation of call and states that one of her brothers will be present at the appointment with pt. She denies additional questions at this time.  Attempted to call report to SNF x2 attempts. No answer and no alternate number available.

## 2024-07-19 NOTE — Progress Notes (Signed)
 In-clinic dual chamber pacemaker check, added on for high RV threshold alert. Presenting Rhythm: AP/VP 60bpm. RA lead measurements stable. RV bipolar threshold measures 2.25V @ 1.40ms and unipolar 1.0V @ 1.36ms; previously 1.75V @ 0.32ms (bipolar) at 06/04/24 OV. Slight rise in RV impedance per trend, now 570 ohms. Pt symptomatic with unipolar RVP during testing. Discussed with FABIENE Arthur, PA, and A. Tillery, PA (who reviewed with Dr. Nancey). Per Dr. Gonzella, program RV output at 5.0V @ 1.39ms bipolar and f/u with Dr. Waddell on 07/25/24. Programmed device as ordered, RV capture management off. No episodes. Estimated longevity 1.7 years. Pt enrolled in remote follow-up.

## 2024-07-19 NOTE — Progress Notes (Signed)
 [name], patient's nurse at [facility], during visit. [name] reported that there is no documentation of patient having pre-syncope/syncope spells or any other cardiac symptoms.

## 2024-07-19 NOTE — Telephone Encounter (Signed)
 Patients daughter calling to follow up from todays visit for her device. Please advise.

## 2024-07-19 NOTE — Telephone Encounter (Signed)
 Patient will be here this afternoon between 2-230pm.

## 2024-07-25 ENCOUNTER — Encounter: Payer: Self-pay | Admitting: Internal Medicine

## 2024-07-25 ENCOUNTER — Ambulatory Visit: Attending: Internal Medicine | Admitting: Internal Medicine

## 2024-07-25 VITALS — BP 151/70 | HR 59 | Ht 59.0 in

## 2024-07-25 DIAGNOSIS — I442 Atrioventricular block, complete: Secondary | ICD-10-CM | POA: Insufficient documentation

## 2024-07-25 NOTE — Patient Instructions (Signed)

## 2024-07-25 NOTE — Progress Notes (Signed)
 HPI Mrs. Brougham returns today for followup. She is a pleasant 89 yo woman, with a h/o CHB, s/p PPM chronic, venous stasis, s/p left AKA, now wheelchair bound who present for ongoing evaluation of her PPM and CHB. She denies chest pain. She has undergone PPM gen change out. No trouble healing. She is concerned after losing her left leg about losing her right leg.  Allergies  Allergen Reactions   Aspirin  Rash and Other (See Comments)    GI distress   Tramadol  Other (See Comments)    GI distress   Naproxen     Unknown reaction   Shellfish-Derived Products Itching and Rash     Current Outpatient Medications  Medication Sig Dispense Refill   acetaminophen  (TYLENOL ) 325 MG tablet Take 2 tablets (650 mg total) by mouth every 6 (six) hours as needed for mild pain (pain score 1-3). 40 tablet 0   albuterol (VENTOLIN HFA) 108 (90 Base) MCG/ACT inhaler Inhale 2 puffs into the lungs every 6 (six) hours as needed for wheezing or shortness of breath.     allopurinol  (ZYLOPRIM ) 100 MG tablet Take by mouth.     ammonium lactate (AMLACTIN) 12 % cream Apply 1 Application topically in the morning and at bedtime.     Biotin  5000 MCG CAPS Take 5,000 mcg by mouth daily. (0900)     carvedilol  (COREG ) 6.25 MG tablet Take 6.25 mg by mouth 2 (two) times daily with a meal. (0900 & 1700)     cetirizine (ZYRTEC) 10 MG tablet Take 10 mg by mouth daily.     Cholecalciferol  (VITAMIN D ) 50 MCG (2000 UT) CAPS Take 2,000 Units by mouth in the morning. (0900)     Cranberry 360 MG CAPS Take by mouth.     cyclobenzaprine  (FLEXERIL ) 5 MG tablet Take 1 tablet (5 mg total) by mouth 3 (three) times daily as needed for muscle spasms. 30 tablet 0   estradiol  (ESTRACE ) 0.1 MG/GM vaginal cream Discard plastic applicator. Insert a blueberry size amount (approximately 1 gram) of cream on gloved fingertip inside vagina at bedtime every night. For long term use. 42.5 g 5   ferrous sulfate 325 (65 FE) MG EC tablet Take 325 mg by  mouth 3 (three) times daily with meals.     furosemide  (LASIX ) 10 MG/ML solution Take by mouth daily.     guaiFENesin -dextromethorphan (ROBITUSSIN DM) 100-10 MG/5ML syrup Take 5 mLs by mouth every 4 (four) hours as needed for cough.     Magnesium  250 MG TABS Take 1 tablet by mouth daily.     melatonin 3 MG TABS tablet Take 3 mg by mouth at bedtime. (2100)     memantine (NAMENDA) 5 MG tablet Take 5 mg by mouth 2 (two) times daily.     Menthol, Topical Analgesic, (BIOFREEZE COLORLESS EX) Apply 1 application  topically in the morning, at noon, and at bedtime. for neck pain     metolazone  (ZAROXOLYN ) 2.5 MG tablet Take 2.5 mg by mouth every other day.     Multiple Vitamins-Minerals (CERTA PLUS) TABS Take by mouth.     nitroGLYCERIN  (NITROSTAT ) 0.4 MG SL tablet Place 0.4 mg under the tongue every 5 (five) minutes as needed for chest pain (X 3 DOSES FOR CHEST PAIN).     oxyCODONE  (OXY IR/ROXICODONE ) 5 MG immediate release tablet Take 1 tablet (5 mg total) by mouth every 4 (four) hours as needed for moderate pain or severe pain (1 tablet for pain 4-6, 2  tablet for pain 7-10). 20 tablet 0   pantoprazole  (PROTONIX ) 40 MG tablet Take 40 mg by mouth daily.     polyethylene glycol powder (GLYCOLAX /MIRALAX ) powder Take 17 g by mouth daily. (0900)     potassium chloride  (KLOR-CON ) 10 MEQ tablet Take 10 mEq by mouth daily.     pregabalin (LYRICA) 25 MG capsule Take 25 mg by mouth 2 (two) times daily.     rosuvastatin (CRESTOR) 40 MG tablet Take 40 mg by mouth daily.     sitaGLIPtin (JANUVIA) 50 MG tablet Take 50 mg by mouth in the morning. (0900)     sucralfate (CARAFATE) 1 g tablet Take 1 g by mouth 4 (four) times daily -  with meals and at bedtime.     No current facility-administered medications for this visit.     Past Medical History:  Diagnosis Date   Arthritis    qwhere   AV block     2:1  January 14, 2011 / pacemaker plan when cellulitis resolved in length   Bradycardia    2:1  AV block    Cellulitis    Superficial cellulitis right lower leg   January 14, 2011   Chronic diastolic CHF (congestive heart failure) (HCC)    Diabetes mellitus without complication (HCC)    Edema    . EF 65-70%, echo, November 29, 2010   Ejection fraction    EF 65%, echo, January, 2012   Gout    Hypertension    Hypokalemia    October, 2012   Murmur    No significant valvular disease by echo, January, 2012   Pacemaker    January, 2013 Medtronic Sensa   Pulmonary hypertension (HCC)     46 mmHg... echo... January, 2012    ROS:   All systems reviewed and negative except as noted in the HPI.   Past Surgical History:  Procedure Laterality Date   ABDOMINAL AORTOGRAM W/LOWER EXTREMITY Left 12/23/2022   Procedure: ABDOMINAL AORTOGRAM W/LOWER EXTREMITY;  Surgeon: Magda Debby SAILOR, MD;  Location: [facility] INVASIVE CV LAB;  Service: Cardiovascular;  Laterality: Left;   AMPUTATION Left 12/26/2022   Procedure: AMPUTATION ABOVE KNEE;  Surgeon: Magda Debby SAILOR, MD;  Location: [facility] OR;  Service: Vascular;  Laterality: Left;   APPLICATION OF WOUND VAC Left 12/26/2022   Procedure: APPLICATION OF WOUND VAC;  Surgeon: Magda Debby SAILOR, MD;  Location: [facility] OR;  Service: Vascular;  Laterality: Left;   CHOLECYSTECTOMY     HIP ARTHROPLASTY Right    INSERT / REPLACE / REMOVE PACEMAKER  11/2011   PARTIAL HYSTERECTOMY     vaginal   PERMANENT PACEMAKER INSERTION N/A 12/09/2011   Procedure: PERMANENT PACEMAKER INSERTION;  Surgeon: Danelle LELON Birmingham, MD;  Location: [facility] CATH LAB;  Service: Cardiovascular;  Laterality: N/A;   PPM GENERATOR CHANGEOUT N/A 01/03/2022   Procedure: PPM GENERATOR CHANGEOUT;  Surgeon: Birmingham Danelle LELON, MD;  Location: [facility] INVASIVE CV LAB;  Service: Cardiovascular;  Laterality: N/A;     Family History  Problem Relation Age of Onset   Cardiomyopathy Father    Diabetes Father    Heart failure Father    Dementia Mother    Cancer Son    Coronary artery disease Son    Heart attack Neg Hx      Social History    Socioeconomic History   Marital status: Widowed    Spouse name: Not on file   Number of children: Not on file   Years of education: Not on  file   Highest education level: Not on file  Occupational History   Occupation: Retired    Associate Professor: RETIRED  Tobacco Use   Smoking status: Never   Smokeless tobacco: Never  Vaping Use   Vaping status: Never Used  Substance and Sexual Activity   Alcohol use: No   Drug use: No   Sexual activity: Never  Other Topics Concern   Not on file  Social History Narrative   Retired    Widowed    Tobacco Use - No.    Alcohol Use - no   Regular Exercise - no   Drug Use - no   Social Drivers of Corporate investment banker Strain: Not on file  Food Insecurity: Not on file  Transportation Needs: Not on file  Physical Activity: Not on file  Stress: Not on file  Social Connections: Not on file  Intimate Partner Violence: Not on file     BP (!) 151/70   Pulse (!) 59   Ht 4' 11 (1.499 m)   SpO2 99%   BMI 34.54 kg/m   Physical Exam:  Well appearing NAD HEENT: Unremarkable Neck:  No JVD, no thyromegally Lymphatics:  No adenopathy Back:  No CVA tenderness Lungs:  Clear HEART:  Regular rate rhythm, no murmurs, no rubs, no clicks Abd:  soft, positive bowel sounds, no organomegally, no rebound, no guarding Ext:  2 plus pulses, no edema, no cyanosis, no clubbing Skin:  No rashes no nodules Neuro:  CN II through XII intact, motor grossly intact  DEVICE  Normal device function.  See PaceArt for details.   Assess/Plan: CHB - she is asymptomatic s/p PPM insertion.  PPM - her RV threshold has gone up a little bipolar and we reprogrammed to Unipolar pacing. Her threshold was 1 at 0.5 in the RV unipolar. She will undergo watchful waiting.  Morbid obesity - a difficult problem with her peripheral vasc disease and advanced age. We will follow.   Danelle Aundrea Higginbotham,MD

## 2024-08-14 NOTE — Progress Notes (Signed)
 Remote PPM Transmission

## 2024-08-15 ENCOUNTER — Emergency Department (HOSPITAL_COMMUNITY)

## 2024-08-15 ENCOUNTER — Emergency Department (HOSPITAL_COMMUNITY)
Admission: EM | Admit: 2024-08-15 | Discharge: 2024-08-16 | Disposition: A | Discharge status: Skilled Nursing Facility | Source: Skilled Nursing Facility | Attending: Emergency Medicine | Admitting: Emergency Medicine

## 2024-08-15 ENCOUNTER — Other Ambulatory Visit: Payer: Self-pay

## 2024-08-15 DIAGNOSIS — E162 Hypoglycemia, unspecified: Secondary | ICD-10-CM | POA: Diagnosis present

## 2024-08-15 DIAGNOSIS — R531 Weakness: Secondary | ICD-10-CM | POA: Insufficient documentation

## 2024-08-15 DIAGNOSIS — A419 Sepsis, unspecified organism: Secondary | ICD-10-CM | POA: Insufficient documentation

## 2024-08-15 DIAGNOSIS — N3000 Acute cystitis without hematuria: Secondary | ICD-10-CM | POA: Insufficient documentation

## 2024-08-15 DIAGNOSIS — R1030 Lower abdominal pain, unspecified: Secondary | ICD-10-CM | POA: Insufficient documentation

## 2024-08-15 DIAGNOSIS — N390 Urinary tract infection, site not specified: Secondary | ICD-10-CM | POA: Diagnosis not present

## 2024-08-15 LAB — CBG MONITORING, ED
Glucose-Capillary: 29 mg/dL — CL (ref 70–99)
Glucose-Capillary: 46 mg/dL — ABNORMAL LOW (ref 70–99)
Glucose-Capillary: 39 mg/dL — CL (ref 70–99)
Glucose-Capillary: 49 mg/dL — ABNORMAL LOW (ref 70–99)

## 2024-08-15 LAB — LACTIC ACID, PLASMA: Lactic Acid, Venous: 1.7 mmol/L (ref 0.5–1.9)

## 2024-08-15 LAB — URINALYSIS, MICROSCOPIC (REFLEX)

## 2024-08-15 LAB — COMPREHENSIVE METABOLIC PANEL WITH GFR
ALT: 26 U/L (ref 0–44)
AST: 55 U/L — ABNORMAL HIGH (ref 15–41)
Albumin: 3.4 g/dL — ABNORMAL LOW (ref 3.5–5.0)
Alkaline Phosphatase: 102 U/L (ref 38–126)
Anion gap: 12 (ref 5–15)
BUN: 13 mg/dL (ref 8–23)
CO2: 22 mmol/L (ref 22–32)
Calcium: 9.6 mg/dL (ref 8.9–10.3)
Chloride: 103 mmol/L (ref 98–111)
Creatinine, Ser: 0.95 mg/dL (ref 0.44–1.00)
GFR, Estimated: 57 mL/min — ABNORMAL LOW (ref >60)
Glucose, Bld: 90 mg/dL (ref 70–99)
Potassium: 4.1 mmol/L (ref 3.5–5.1)
Sodium: 137 mmol/L (ref 135–145)
Total Bilirubin: 0.6 mg/dL (ref 0.0–1.2)
Total Protein: 7.2 g/dL (ref 6.5–8.1)

## 2024-08-15 LAB — CBC WITH DIFFERENTIAL/PLATELET
Abs Immature Granulocytes: 0.04 K/uL (ref 0.00–0.07)
Basophils Absolute: 0 K/uL (ref 0.0–0.1)
Basophils Relative: 1 %
Eosinophils Absolute: 0.3 K/uL (ref 0.0–0.5)
Eosinophils Relative: 6 %
HCT: 38 % (ref 36.0–46.0)
Hemoglobin: 12.2 g/dL (ref 12.0–15.0)
Immature Granulocytes: 1 %
Lymphocytes Relative: 24 %
Lymphs Abs: 1.2 K/uL (ref 0.7–4.0)
MCH: 27.8 pg (ref 26.0–34.0)
MCHC: 32.1 g/dL (ref 30.0–36.0)
MCV: 86.6 fL (ref 80.0–100.0)
Monocytes Absolute: 0.7 K/uL (ref 0.1–1.0)
Monocytes Relative: 14 %
Neutro Abs: 2.8 K/uL (ref 1.7–7.7)
Neutrophils Relative %: 54 %
Platelets: 180 K/uL (ref 150–400)
RBC: 4.39 MIL/uL (ref 3.87–5.11)
RDW: 18.1 % — ABNORMAL HIGH (ref 11.5–15.5)
WBC: 5.1 K/uL (ref 4.0–10.5)
nRBC: 0 % (ref 0.0–0.2)

## 2024-08-15 LAB — URINALYSIS, ROUTINE W REFLEX MICROSCOPIC
Bilirubin Urine: NEGATIVE
Glucose, UA: 100 mg/dL — AB
Ketones, ur: NEGATIVE mg/dL
Nitrite: POSITIVE — AB
Protein, ur: 30 mg/dL — AB
Specific Gravity, Urine: <1.005 — ABNORMAL LOW (ref 1.005–1.030)
pH: 6 (ref 5.0–8.0)

## 2024-08-15 LAB — RESP PANEL BY RT-PCR (RSV, FLU A&B, COVID)  RVPGX2
Influenza A by PCR: NEGATIVE
Influenza B by PCR: NEGATIVE
Resp Syncytial Virus by PCR: NEGATIVE
SARS Coronavirus 2 by RT PCR: NEGATIVE

## 2024-08-15 MED ORDER — FLUCONAZOLE 150 MG PO TABS
150.0000 mg | ORAL_TABLET | Freq: Once | ORAL | Status: AC
  Administered 2024-08-15: 150 mg via ORAL
  Filled 2024-08-15: qty 1

## 2024-08-15 MED ORDER — DEXTROSE 50 % IV SOLN: 1.0000 | Freq: Once | INTRAVENOUS | Status: DC

## 2024-08-15 MED ORDER — DEXTROSE 50 % IV SOLN: 1.0000 | Freq: Once | INTRAVENOUS | Status: AC

## 2024-08-15 MED ORDER — AMOXICILLIN-POT CLAVULANATE 875-125 MG PO TABS: 1.0000 | ORAL_TABLET | Freq: Two times a day (BID) | ORAL | 0 refills | Status: AC

## 2024-08-15 MED ORDER — DEXTROSE 50 % IV SOLN: 12.5000 g | Freq: Once | INTRAVENOUS | Status: DC

## 2024-08-15 MED ORDER — SODIUM CHLORIDE 0.9 % IV SOLN
1.0000 g | Freq: Once | INTRAVENOUS | Status: AC
  Administered 2024-08-15: 1 g via INTRAVENOUS
  Filled 2024-08-15: qty 10

## 2024-08-15 MED ORDER — DEXTROSE 50 % IV SOLN
INTRAVENOUS | Status: AC
  Administered 2024-08-15: 50 mL via INTRAVENOUS
  Filled 2024-08-15: qty 50

## 2024-08-15 NOTE — Discharge Instructions (Addendum)
 Please follow-up closely with your primary care doctor on an outpatient basis.  Return to emergency department immediately for any new or worsening symptoms.  Please start taking the Augmentin  tomorrow evening.  You did have a noted nodule in the right upper lobe of your lung.  Please ensure that your primary care doctor continues to monitor this.

## 2024-08-15 NOTE — Consult Note (Signed)
 Patient Demographics  [name], is a 89 y.o. female   MRN: [id number]   DOB - 05/18/1931  Admit Date - 08/15/2024    Outpatient Primary MD for the patient is [place], [place]  Consult requested in the Hospital by Dean Clarity, MD, On 08/15/2024    Reason for consult : Evaluate need for admission for UTI   With History of -  Past Medical History:  Diagnosis Date   Arthritis    qwhere   AV block     2:1  January 14, 2011 / pacemaker plan when cellulitis resolved in length   Bradycardia    2:1  AV block   Cellulitis    Superficial cellulitis right lower leg   January 14, 2011   Chronic diastolic CHF (congestive heart failure) (HCC)    Diabetes mellitus without complication (HCC)    Edema    . EF 65-70%, echo, November 29, 2010   Ejection fraction    EF 65%, echo, January, 2012   Gout    Hypertension    Hypokalemia    October, 2012   Murmur    No significant valvular disease by echo, January, 2012   Pacemaker    January, 2013 Medtronic Sensa   Pulmonary hypertension (HCC)     46 mmHg... echo... January, 2012      Past Surgical History:  Procedure Laterality Date   ABDOMINAL AORTOGRAM W/LOWER EXTREMITY Left 12/23/2022   Procedure: ABDOMINAL AORTOGRAM W/LOWER EXTREMITY;  Surgeon: Magda Debby SAILOR, MD;  Location: [facility] INVASIVE CV LAB;  Service: Cardiovascular;  Laterality: Left;   AMPUTATION Left 12/26/2022   Procedure: AMPUTATION ABOVE KNEE;  Surgeon: Magda Debby SAILOR, MD;  Location: [facility] OR;  Service: Vascular;  Laterality: Left;   APPLICATION OF WOUND VAC Left 12/26/2022   Procedure: APPLICATION OF WOUND VAC;  Surgeon: Magda Debby SAILOR, MD;  Location: [facility] OR;  Service: Vascular;  Laterality: Left;   CHOLECYSTECTOMY     HIP ARTHROPLASTY Right    INSERT / REPLACE / REMOVE PACEMAKER  11/2011   PARTIAL HYSTERECTOMY     vaginal   PERMANENT PACEMAKER INSERTION N/A 12/09/2011   Procedure:  PERMANENT PACEMAKER INSERTION;  Surgeon: Danelle LELON [place], MD;  Location: [facility] CATH LAB;  Service: Cardiovascular;  Laterality: N/A;   PPM GENERATOR CHANGEOUT N/A 01/03/2022   Procedure: PPM GENERATOR CHANGEOUT;  Surgeon: Birmingham Danelle LELON, MD;  Location: [facility] INVASIVE CV LAB;  Service: Cardiovascular;  Laterality: N/A;    in for   Chief Complaint  Patient presents with   Hypoglycemia   Weakness     HPI  [name]  is a 89 y.o. female, with past medical history significant for DM2, HTN, chronic diastolic congestive heart failure, complete heart block s/p PPM, pulmonary hypertension, patient SNF resident, she was sent to ED for evaluation for UTI and weakness, patient was recently treated with Cipro  for UTI, Cipro  from 9/12 till 9/19, she still had some weakness, for which UA has been obtained 9/22 which did still  show bacteriuria and pyuria for which culture growing Proteus, she was sent to ED for evaluation, she was noted by EMS to have CBG of 49 for which they gave dextrose , in ED her fingersticks were 29 where she received another D50, but apparently these are false reading as NAD during this low CBG readings i-STAT was obtained with normal blood glucose from 90-170, and she was asymptomatic during these events, her workup was reassuring including CT head, CT abdomen pelvis, CBC, BMP, lactic acid, respiratory viral panel, but her UA came back for UTI so [facility] requested to evaluate patient.    Review of Systems     A full 10 point Review of Systems was done, except as stated above, all other Review of Systems were negative.   Social History Social History   Tobacco Use   Smoking status: Never   Smokeless tobacco: Never  Substance Use Topics   Alcohol use: No     Family History Family History  Problem Relation Age of Onset   Cardiomyopathy Father    Diabetes Father    Heart failure Father    Dementia Mother    Cancer Son    Coronary artery disease Son    Heart  attack Neg Hx      Prior to Admission medications   Medication Sig Start Date End Date Taking? Authorizing Provider  albuterol (VENTOLIN HFA) 108 (90 Base) MCG/ACT inhaler Inhale 2 puffs into the lungs every 4 (four) hours as needed for shortness of breath.   Yes [provider]  allopurinol  (ZYLOPRIM ) 100 MG tablet Take 100 mg by mouth daily.   Yes [provider]  ammonium lactate (AMLACTIN) 12 % cream Apply 1 Application topically in the morning and at bedtime.   Yes [provider]  amoxicillin -clavulanate (AUGMENTIN ) 875-125 MG tablet Take 1 tablet by mouth every 12 (twelve) hours for 3 days. Start taking the evening of 08/16/24 08/15/24 08/18/24 Yes Rigney, Lonni BIRCH, PA-C  Biotin  5000 MCG CAPS Take 5,000 mcg by mouth daily. (0900)   Yes [provider]  carvedilol  (COREG ) 6.25 MG tablet Take 6.25 mg by mouth 2 (two) times daily with a meal. Hold for HR <60 (0900 & 1700)   Yes [provider]  cetirizine (ZYRTEC) 10 MG tablet Take 10 mg by mouth daily.   Yes [provider]  Cholecalciferol  (VITAMIN D3) 50 MCG (2000 UT) TABS Take 50 mcg by mouth daily. 0900   Yes [provider]  colchicine  0.6 MG tablet Take 0.6 mg by mouth 2 (two) times daily.   Yes [provider]  Cranberry 300 MG tablet Take 300 mg by mouth daily.   Yes [provider]  cyclobenzaprine  (FLEXERIL ) 5 MG tablet Take 1 tablet (5 mg total) by mouth 3 (three) times daily as needed for muscle spasms. Patient taking differently: Take 5 mg by mouth every 8 (eight) hours as needed for muscle spasms. 10/13/23  Yes Raford Lenis, MD  Emollient (EUCERIN) lotion Apply 1 Application topically every 12 (twelve) hours as needed for dry skin. Apply to Rt foot/ankle/leg   Yes [provider]  estradiol  (ESTRACE ) 0.1 MG/GM vaginal cream Discard plastic applicator. Insert a blueberry size amount (approximately 1 gram) of cream on gloved fingertip  inside vagina at bedtime every night. For long term use. Patient taking differently: Place 1 g vaginally every Monday, Wednesday, and Friday. Discard plastic applicator. Insert a blueberry size amount (approximately 1 gram) of cream on gloved fingertip inside  vagina at bedtime every night. For long term use. 09/05/23  Yes Gerldine Lauraine BROCKS, FNP  ferrous sulfate 325 (65 FE) MG EC tablet Take 325 mg by mouth daily with breakfast.   Yes [provider]  furosemide  (LASIX ) 20 MG tablet Take 10 mg by mouth daily.   Yes [provider]  guaiFENesin  (MUCINEX ) 600 MG 12 hr tablet Take 600 mg by mouth every 12 (twelve) hours. 08/12/24 08/17/24 Yes [provider]  Magnesium  250 MG TABS Take 1 tablet by mouth daily.   Yes [provider]  melatonin 3 MG TABS tablet Take 3 mg by mouth at bedtime. (2100)   Yes [provider]  memantine (NAMENDA) 5 MG tablet Take 5 mg by mouth daily.   Yes [provider]  Menthol, Topical Analgesic, (BIOFREEZE COLORLESS EX) Apply 1 application  topically in the morning, at noon, and at bedtime. for neck pain   Yes [provider]  Menthol-Zinc  Oxide (CALMOSEPTINE) 0.44-20.6 % OINT Apply 1 Application topically in the morning, at noon, and at bedtime. Apply to buttocks/gluteal folds topically for MASD (moisture associated skin damage) for 2 weeks 08/02/24 08/16/24 Yes [provider]  nitroGLYCERIN  (NITROSTAT ) 0.4 MG SL tablet Place 0.4 mg under the tongue every 5 (five) minutes as needed for chest pain (X 3 DOSES FOR CHEST PAIN).   Yes [provider]  oxyCODONE  (OXY IR/ROXICODONE ) 5 MG immediate release tablet Take 1 tablet (5 mg total) by mouth every 4 (four) hours as needed for moderate pain or severe pain (1 tablet for pain 4-6, 2 tablet for pain 7-10). Patient taking differently: Take 5 mg by mouth every 8 (eight) hours as needed for severe pain (pain score 7-10). 12/31/22  Yes Ghimire, Kuber, MD   OXYGEN Inhale 2 L/min into the lungs continuous. Keep O2 saturation greater than 90%   Yes [provider]  pantoprazole  (PROTONIX ) 40 MG tablet Take 40 mg by mouth daily. 0830   Yes [provider]  phenazopyridine (PYRIDIUM) 95 MG tablet Take 95 mg by mouth in the morning, at noon, and at bedtime. AZO Urinary Pain Relief 08/13/24 08/15/24 Yes [provider]  polyethylene glycol powder (GLYCOLAX /MIRALAX ) powder Take 17 g by mouth daily. (0900)   Yes [provider]  potassium chloride  (KLOR-CON ) 10 MEQ tablet Take 10 mEq by mouth daily.   Yes [provider]  pregabalin (LYRICA) 25 MG capsule Take 25 mg by mouth 2 (two) times daily.   Yes [provider]  rosuvastatin (CRESTOR) 40 MG tablet Take 40 mg by mouth daily.   Yes [provider]  sitaGLIPtin (JANUVIA) 50 MG tablet Take 50 mg by mouth in the morning. (0900)   Yes [provider]  sucralfate (CARAFATE) 1 g tablet Take 1 g by mouth with breakfast, with lunch, and with evening meal.   Yes [provider]  Vitamins A & D (VITAMIN A & D) ointment Apply 1 Application topically See admin instructions. Apply to buttocks/gluteal folds/Rt rear thigh for prevention of moisture associated skin damage. Apply after incontinence care every shift. MAR has 3 daily applications for this med as well as a PRN order every 12 hours as needed.   Yes [provider]    Anti-infectives (From admission, onward)    Start     Dose/Rate Route Frequency Ordered Stop   08/15/24 2115  cefTRIAXone  (ROCEPHIN ) 1 g in sodium chloride  0.9 % 100 mL IVPB  1 g 200 mL/hr over 30 Minutes Intravenous  Once 08/15/24 2101 08/15/24 2143   08/15/24 2115  fluconazole  (DIFLUCAN ) tablet 150 mg        150 mg Oral  Once 08/15/24 2101 08/15/24 2119   08/15/24 0000  amoxicillin -clavulanate (AUGMENTIN ) 875-125 MG tablet        1 tablet Oral Every 12 hours 08/15/24 2154 08/18/24 2359        Scheduled Meds:  dextrose   12.5 g Intravenous Once   dextrose   1 ampule Intravenous Once   Continuous Infusions: PRN Meds:.  Allergies  Allergen Reactions   Aspirin  Rash and Other (See Comments)    GI distress   Tramadol  Other (See Comments)    GI distress   Naproxen Other (See Comments)    Unknown reaction   Shellfish-Derived Products Itching and Rash    Physical Exam  Vitals  Blood pressure 113/67, pulse 60, temperature (!) 97.4 F (36.3 C), temperature source Oral, resp. rate 18, height 4' 11 (1.499 m), weight 89.7 kg, SpO2 93%.   1. General Elderly female, laying in bed, no apparent distress  2.  Awake, alert, conversant, pleasant  3. No F.N deficits, ALL C.Nerves Intact, Strength 5/5 all 4 extremities, Sensation intact all 4 extremities, Plantars down going.  4. Ears and Eyes appear Normal, Conjunctivae clear, PERRLA. Moist Oral Mucosa.  5. Supple Neck, No JVD, No cervical lymphadenopathy appriciated, No Carotid Bruits.  6. Symmetrical Chest wall movement, Good air movement bilaterally, CTAB.  7. RRR, No Gallops, Rubs or Murmurs, No Parasternal Heave.  8. Positive Bowel Sounds, Abdomen Soft, No tenderness, No organomegaly appriciated,No rebound -guarding or rigidity.  9.  No Cyanosis, Normal Skin Turgor, No Skin Rash or Bruise.  10. Good muscle tone, lower extremity with intact pulses, no cyanosis, left lower extremity status post AKA, no tenderness or dehiscence   Data Review  CBC Recent Labs  Lab 08/15/24 1430  WBC 5.1  HGB 12.2  HCT 38.0  PLT 180  MCV 86.6  MCH 27.8  MCHC 32.1  RDW 18.1*  LYMPHSABS 1.2  MONOABS 0.7  EOSABS 0.3  BASOSABS 0.0   ------------------------------------------------------------------------------------------------------------------  Chemistries  Recent Labs  Lab 08/15/24 1430  NA 137  K 4.1  CL 103  CO2 22  GLUCOSE 90  BUN 13  CREATININE 0.95  CALCIUM  9.6  AST 55*  ALT 26  ALKPHOS 102  BILITOT  0.6   ------------------------------------------------------------------------------------------------------------------ estimated creatinine clearance is 38.4 mL/min (by C-G formula based on SCr of 0.95 mg/dL). ------------------------------------------------------------------------------------------------------------------ No results for input(s): TSH, T4TOTAL, T3FREE, THYROIDAB in the last 72 hours.  Invalid input(s): FREET3   Coagulation profile No results for input(s): INR, PROTIME in the last 168 hours. ------------------------------------------------------------------------------------------------------------------- No results for input(s): DDIMER in the last 72 hours. -------------------------------------------------------------------------------------------------------------------  Cardiac Enzymes No results for input(s): CKMB, TROPONINI, MYOGLOBIN in the last 168 hours.  Invalid input(s): CK ------------------------------------------------------------------------------------------------------------------ Invalid input(s): POCBNP   ---------------------------------------------------------------------------------------------------------------  Urinalysis    Component Value Date/Time   COLORURINE ORANGE (A) 08/15/2024 1940   APPEARANCEUR HAZY (A) 08/15/2024 1940   LABSPEC <1.005 (L) 08/15/2024 1940   PHURINE 6.0 08/15/2024 1940   GLUCOSEU 100 (A) 08/15/2024 1940   HGBUR SMALL (A) 08/15/2024 1940   BILIRUBINUR NEGATIVE 08/15/2024 1940   KETONESUR NEGATIVE 08/15/2024 1940   PROTEINUR 30 (A) 08/15/2024 1940   UROBILINOGEN 0.2 08/06/2014 2250   NITRITE POSITIVE (A) 08/15/2024 1940   LEUKOCYTESUR LARGE (A) 08/15/2024 1940     Imaging results:  CT ABDOMEN PELVIS WO CONTRAST Result Date: 08/15/2024 EXAM: CT ABDOMEN AND PELVIS WITHOUT CONTRAST 08/15/2024 04:47:43 PM TECHNIQUE: CT of the abdomen and pelvis was performed without the  administration of intravenous contrast. Multiplanar reformatted images are provided for review. Automated exposure control, iterative reconstruction, and/or weight-based adjustment of the mA/kV was utilized to reduce the radiation dose to as low as reasonably achievable. COMPARISON: None available. CLINICAL HISTORY: Abdominal pain, acute, nonlocalized. Pt arrived [facility] from [facility] with c/o weakness x few days. [facility] noted pt CBG low and after giving 15 gm of sugar result was lower. FINDINGS: LIMITATIONS/ARTIFACTS: Motion degraded images. LOWER CHEST: Left subclavian ICD. LIVER: The liver is unremarkable. GALLBLADDER AND BILE DUCTS: Status post cholecystectomy. No biliary ductal dilatation. SPLEEN: No acute abnormality. PANCREAS: No acute abnormality. ADRENAL GLANDS: No acute abnormality. KIDNEYS, URETERS AND BLADDER: Suspected right renal vascular calcification. No stones in the kidneys or ureters. No hydronephrosis. No perinephric or periureteral stranding. Urinary bladder is unremarkable. GI AND BOWEL: Stomach demonstrates no acute abnormality. Mild sigmoid diverticulosis without evidence of diverticulitis. There is no bowel obstruction. PERITONEUM AND RETROPERITONEUM: No ascites. No free air. VASCULATURE: Atherosclerotic calcifications of the abdominal aorta and branch vessels. Aorta is normal in caliber. LYMPH NODES: No lymphadenopathy. REPRODUCTIVE ORGANS: Status post hysterectomy. BONES AND SOFT TISSUES: Right hip arthroplasty. Moderate degenerative changes of the left hip. Mild degenerative changes of the visualized thoracolumbar spine with mid lumbar dextroscoliosis. Moderate fat-containing periumbilical hernia. No acute osseous abnormality. IMPRESSION: 1. No acute abnormalities in the abdomen or pelvis. 2. Ancillary findings as above. Electronically signed by: Pinkie Pebbles MD 08/15/2024 05:32 PM EDT RP Workstation: [id number]   CT Head Wo Contrast Result Date: 08/15/2024 CLINICAL DATA:  Syncope/presyncope,  cerebrovascular cause suspected. Weakness for few days. EXAM: CT HEAD WITHOUT CONTRAST TECHNIQUE: Contiguous axial images were obtained from the base of the skull through the vertex without intravenous contrast. RADIATION DOSE REDUCTION: This exam was performed according to the departmental dose-optimization program which includes automated exposure control, adjustment of the mA and/or kV according to patient size and/or use of iterative reconstruction technique. COMPARISON:  Head CT 05/30/2023 FINDINGS: The study is mildly motion degraded. Brain: There is no evidence of an acute infarct, intracranial hemorrhage, mass, midline shift, or extra-axial fluid collection. Mild cerebral atrophy is within normal limits for age. Cerebral white matter hypodensities are similar to the prior study and are nonspecific but compatible with mild chronic small vessel ischemic disease. Vascular: Calcified atherosclerosis at the skull base. No hyperdense vessel. Skull: No acute fracture or suspicious lesion. Sinuses/Orbits: Visualized paranasal sinuses and mastoid air cells are clear. Unremarkable included orbits. Other: None. IMPRESSION: 1. No evidence of acute intracranial abnormality. 2. Mild chronic small vessel ischemic disease. Electronically Signed   By: Dasie Hamburg M.D.   On: 08/15/2024 17:25   DG Chest Port 1 View Result Date: 08/15/2024 EXAM: 1 VIEW(S) XRAY OF THE CHEST 08/15/2024 02:59:00 PM COMPARISON: 05/30/2023 CLINICAL HISTORY: weakness. Pt arrived [facility] from [facility] with c/o weakness x few days. [facility] noted pt CBG low and after giving 15 gm of sugar result was lower. Reason for exam: weakness FINDINGS: LINES, TUBES AND DEVICES: Left chest wall cardiac pacer with leads overlying the right atrium and right ventricle. Right upper quadrant surgical clips. LUNGS AND PLEURA: Question 1.8 cm nodular opacity in right upper lung. Low lung volumes. Elevated right hemidiaphragm. Coarsened interstitial markings without pulmonary  edema. No pleural effusion. No pneumothorax. HEART AND MEDIASTINUM: Cardiomegaly. BONES AND SOFT TISSUES: Severe degenerative changes  noted within the glenohumeral joints. IMPRESSION: 1. No acute findings. 2. Unchanged cardiomegaly and chronic increased interstitial markings 3. Questionable 1.8 cm nodular opacity in the right upper lung. Recommend follow-up imaging with upright PA radiograph of the chest as patient's clinical condition tolerates. If persistent, then dedicated CT of the chest would be advised. 4. Cardiomegaly. Electronically signed by: Waddell Calk MD 08/15/2024 03:23 PM EDT RP Workstation: [id number]     Assessment & Plan  Active Problems:   Weakness   UTI (urinary tract infection)    Weakness UTI - Presents with weakness over last few days, she was recently diagnosed with UTI at her facility where she received total 1 week of Cipro , from 9/12 until 9/19, for which she still reported weakness, UA was sent 9/22 which came back significant for pyuria and bacteriuria, urine culture growing 50,000 colonies of Proteus, susceptibility not available, according to antibiotic stability guidelines appears Unasyn as 95% sensitivity, so recommendation to discharge around 3 days of oral Augmentin  start from tomorrow evening as she received 1 dose of IV Rocephin  during ED stay (98% sensitivity) - With erroneous low peripheral CBG readings, as during these readings she was totally asymptomatic, as well i-STAT blood glucose was correlating with normal CBG readings. - Overall her workup in ED is reassuring, with normal kidney function, no leukocytosis, lactic acid within normal limit, negative respiratory panel, CT head and CT abdomen pelvis with no acute findings.     Brayton Lye M.D on 08/15/2024 at 9:54 PM   Thank you for the consult   [facility]   Office  [phone]

## 2024-08-15 NOTE — ED Notes (Signed)
 8 oz of OJ given upon arrival of [facility] . Pt able to drink it with a straw, no sweating noted.

## 2024-08-15 NOTE — ED Triage Notes (Signed)
 Pt arrived [facility] from [facility] with c/o weakness x few days. [facility] noted pt CBG low and after giving 15 gm of sugar result was lower.

## 2024-08-15 NOTE — ED Provider Notes (Signed)
 [place] EMERGENCY DEPARTMENT AT [facility] Provider Note   CSN: [id number] Arrival date & time: 08/15/24  1358     Patient presents with: Hypoglycemia and Weakness   [name] is a 89 y.o. female.   Patient is a 89 year old female who presents to the emergency department by EMS from her long-term care facility secondary to generalized weakness for approximate the past 3 days.  On presentation the patient admits to associated lower abdominal pain but denies any associated chest pain, shortness of breath.  She does appear to be mentating well.  EMS noted that her glucose was 49 and they did give dextrose .  Patient was given repeat D50 in the emergency department with fingerstick of 29.  Patient is currently on Januvia for her diabetes.   Hypoglycemia Associated symptoms: weakness   Weakness      Prior to Admission medications   Medication Sig Start Date End Date Taking? Authorizing Provider  acetaminophen  (TYLENOL ) 325 MG tablet Take 2 tablets (650 mg total) by mouth every 6 (six) hours as needed for mild pain (pain score 1-3). 10/13/23   Raford Lenis, MD  albuterol (VENTOLIN HFA) 108 (90 Base) MCG/ACT inhaler Inhale 2 puffs into the lungs every 6 (six) hours as needed for wheezing or shortness of breath.    [provider]  allopurinol  (ZYLOPRIM ) 100 MG tablet Take by mouth.    [provider]  ammonium lactate (AMLACTIN) 12 % cream Apply 1 Application topically in the morning and at bedtime.    [provider]  Biotin  5000 MCG CAPS Take 5,000 mcg by mouth daily. (0900)    [provider]  carvedilol  (COREG ) 6.25 MG tablet Take 6.25 mg by mouth 2 (two) times daily with a meal. (0900 & 1700)    [provider]  cetirizine (ZYRTEC) 10 MG tablet Take 10 mg by mouth daily.    [provider]  Cholecalciferol  (VITAMIN D ) 50 MCG (2000 UT) CAPS Take 2,000 Units by mouth in the morning. (0900)    [provider]  Cranberry 360 MG CAPS Take by mouth.    [provider]  cyclobenzaprine  (FLEXERIL ) 5 MG tablet Take 1 tablet (5 mg total) by mouth 3 (three) times daily as needed for muscle spasms. 10/13/23   Raford Lenis, MD  estradiol  (ESTRACE ) 0.1 MG/GM vaginal cream Discard plastic applicator. Insert a blueberry size amount (approximately 1 gram) of cream on gloved fingertip inside vagina at bedtime every night. For long term use. 09/05/23   Gerldine Lauraine BROCKS, FNP  ferrous sulfate 325 (65 FE) MG EC tablet Take 325 mg by mouth 3 (three) times daily with meals.    [provider]  furosemide  (LASIX ) 10 MG/ML solution Take by mouth daily.    [provider]  guaiFENesin -dextromethorphan (ROBITUSSIN DM) 100-10 MG/5ML syrup Take 5 mLs by mouth every 4 (four) hours as needed for cough.    [provider]  Magnesium  250 MG TABS Take 1 tablet by mouth daily.    [provider]  melatonin 3 MG TABS tablet Take 3 mg by mouth at bedtime. (2100)    [provider]  memantine (NAMENDA) 5 MG tablet Take 5 mg by mouth 2 (two) times daily.    [provider]  Menthol, Topical Analgesic, (BIOFREEZE COLORLESS EX) Apply 1 application  topically in the morning, at noon, and at bedtime. for neck pain    [provider]  metolazone  (ZAROXOLYN ) 2.5 MG tablet Take  2.5 mg by mouth every other day.    [provider]  Multiple Vitamins-Minerals (CERTA PLUS) TABS Take by mouth.    [provider]  nitroGLYCERIN  (NITROSTAT ) 0.4 MG SL tablet Place 0.4 mg under the tongue every 5 (five) minutes as needed for chest pain (X 3 DOSES FOR CHEST PAIN).    [provider]  oxyCODONE  (OXY IR/ROXICODONE ) 5 MG immediate release tablet Take 1 tablet (5 mg total) by mouth every 4 (four) hours as needed for moderate pain or severe pain (1 tablet for pain 4-6, 2 tablet for pain 7-10). 12/31/22   Raenelle Coria, MD  pantoprazole  (PROTONIX ) 40 MG tablet Take  40 mg by mouth daily.    [provider]  polyethylene glycol powder (GLYCOLAX /MIRALAX ) powder Take 17 g by mouth daily. (0900)    [provider]  potassium chloride  (KLOR-CON ) 10 MEQ tablet Take 10 mEq by mouth daily.    [provider]  pregabalin (LYRICA) 25 MG capsule Take 25 mg by mouth 2 (two) times daily.    [provider]  rosuvastatin (CRESTOR) 40 MG tablet Take 40 mg by mouth daily.    [provider]  sitaGLIPtin (JANUVIA) 50 MG tablet Take 50 mg by mouth in the morning. (0900)    [provider]  sucralfate (CARAFATE) 1 g tablet Take 1 g by mouth 4 (four) times daily -  with meals and at bedtime.    [provider]    Allergies: Aspirin , Tramadol , Naproxen, and Shellfish-derived products    Review of Systems  Neurological:  Positive for weakness.  All other systems reviewed and are negative.   Updated Vital Signs BP 139/76 (BP Location: Right Wrist)   Pulse 60   Temp (!) 97.4 F (36.3 C) (Oral)   Resp 18   Ht 4' 11 (1.499 m)   Wt 89.7 kg   SpO2 100%   BMI 39.93 kg/m   Physical Exam Vitals and nursing note reviewed.  Constitutional:      General: She is not in acute distress.    Appearance: Normal appearance. She is not ill-appearing.  HENT:     Head: Normocephalic and atraumatic.     Nose: Nose normal.     Mouth/Throat:     Mouth: Mucous membranes are moist.  Eyes:     Extraocular Movements: Extraocular movements intact.     Conjunctiva/sclera: Conjunctivae normal.     Pupils: Pupils are equal, round, and reactive to light.  Cardiovascular:     Rate and Rhythm: Normal rate and regular rhythm.     Pulses: Normal pulses.     Heart sounds: Normal heart sounds. No murmur heard.    No gallop.  Pulmonary:     Effort: Pulmonary effort is normal. No respiratory distress.     Breath sounds: Normal breath sounds. No stridor. No wheezing, rhonchi or rales.  Abdominal:     General: Abdomen is flat.  Bowel sounds are normal. There is no distension.     Palpations: Abdomen is soft.     Tenderness: There is no abdominal tenderness. There is no guarding.  Musculoskeletal:        General: Normal range of motion.     Cervical back: Normal range of motion and neck supple.     Right lower leg: No edema.     Comments: Status post AKA of left lower extremity, intact peripheral pulses to right lower extremity, no overlying erythema or warmth, no bony tenderness noted throughout  Skin:    General: Skin is warm and dry.     Findings: No bruising or rash.  Neurological:     General: No focal deficit present.     Mental Status: She is alert and oriented to person, place, and time. Mental status is at baseline.     Cranial Nerves: No cranial nerve deficit.     Sensory: No sensory deficit.     Coordination: Coordination normal.  Psychiatric:        Mood and Affect: Mood normal.        Behavior: Behavior normal.        Thought Content: Thought content normal.        Judgment: Judgment normal.     (all labs ordered are listed, but only abnormal results are displayed) Labs Reviewed  CBG MONITORING, ED - Abnormal; Notable for the following components:      Result Value   Glucose-Capillary 46 (*)    All other components within normal limits  CBG MONITORING, ED - Abnormal; Notable for the following components:   Glucose-Capillary 29 (*)    All other components within normal limits  CBG MONITORING, ED - Abnormal; Notable for the following components:   Glucose-Capillary 49 (*)    All other components within normal limits  CULTURE, BLOOD (ROUTINE X 2)  CULTURE, BLOOD (ROUTINE X 2)  SARS CORONAVIRUS 2 BY RT PCR  COMPREHENSIVE METABOLIC PANEL WITH GFR  LACTIC ACID, PLASMA  LACTIC ACID, PLASMA  CBC WITH DIFFERENTIAL/PLATELET  URINALYSIS, ROUTINE W REFLEX MICROSCOPIC  I-STAT CHEM 8, ED    EKG: None  Radiology: No results found.   Procedures   Medications Ordered in the ED  dextrose   50 % solution 12.5 g (has no administration in time range)  dextrose  50 % solution 50 mL (has no administration in time range)  dextrose  50 % solution 50 mL (50 mLs Intravenous Given 08/15/24 1439)    Clinical Course as of 08/15/24 2155  Thu Aug 15, 2024  1644 Repeat i-STAT demonstrated glucose of 175 [CR]  2056 Repeat i-STAT demonstrated glucose of 90 [CR]    Clinical Course User Index [CR] [name] Lonni BIRCH, PA-C                                 Medical Decision Making Amount and/or Complexity of Data Reviewed Labs: ordered. Radiology: ordered.  Risk Prescription drug management.   This patient presents to the ED for concern of weakness differential diagnosis includes urinary tract infection, pneumonia, sepsis, hypoglycemia, anemia, CVA, TIA    Additional history obtained:  Additional history obtained from medical records External records from outside source obtained and reviewed including medical records   Lab Tests:  I Ordered, and personally interpreted labs.  The pertinent results include: No leukocytosis, no anemia, normal kidney function liver function, unremarkable electrolytes, normal lactic acid, urinalysis with large leukocytes and nitrite positive, negative viral swab   Imaging Studies ordered:  I ordered imaging studies including CT scan head, CT abdomen pelvis, chest x-ray I independently visualized and interpreted imaging which showed no acute intracranial process, no acute intra-abdominal surgical process, no acute cardiopulmonary process, right pulmonary nodule I agree with the radiologist interpretation   Medicines ordered and prescription drug management:  I ordered medication including Rocephin , Diflucan  for urinary tract infection Reevaluation of the patient after these medicines showed that the patient improved I have reviewed the patients home medicines and have  made adjustments as needed   Problem List / ED Course:  Patient is doing  well at this time and does remain stable.  Patient continues to mentate at her baseline.  It was difficult obtaining an accurate fingerstick glucose on the patient initially and initial values were erroneous.  I-STAT Chem-8's were obtained demonstrating glucose of 175 and 90.  She has been tolerating p.o. intake without difficulty and does continue to mentate at her baseline.  She has not required any further dextrose  and do not suspect that initial values were accurate.  She does have findings consistent with urinary tract infection.  Son notes that they have been treating this at her long-term care facility but is unclear what antibiotic she has been on.  Did initially discuss patient case with Dr. Sherlon with the hospitalist service for admission though he did evaluate the patient fully including the patient and her labs noting that she may be discharged back to her facility placed on Augmentin  for 3 more days.  Remaining workup in the emergency department has been unremarkable.  It was included in her discharge papers to follow-up with her primary care doctor regarding the pulmonary nodule.  Strict return precautions provided for any new or worsening symptoms.  Patient voiced understanding and had no additional questions.   Social Determinants of Health:  None        Final diagnoses:  None    ED Discharge Orders     None          [name] 08/15/24 2158    Dean Clarity, MD 08/16/24 [phone]

## 2024-08-16 LAB — I-STAT CHEM 8, ED
BUN: 13 mg/dL (ref 8–23)
BUN: 12 mg/dL (ref 8–23)
Calcium, Ion: 1.31 mmol/L (ref 1.15–1.40)
Calcium, Ion: 1.1 mmol/L — ABNORMAL LOW (ref 1.15–1.40)
Chloride: 104 mmol/L (ref 98–111)
Chloride: 106 mmol/L (ref 98–111)
Creatinine, Ser: 1 mg/dL (ref 0.44–1.00)
Creatinine, Ser: 0.9 mg/dL (ref 0.44–1.00)
Glucose, Bld: 87 mg/dL (ref 70–99)
Glucose, Bld: 175 mg/dL — ABNORMAL HIGH (ref 70–99)
HCT: 39 % (ref 36.0–46.0)
HCT: 38 % (ref 36.0–46.0)
Hemoglobin: 13.3 g/dL (ref 12.0–15.0)
Hemoglobin: 12.9 g/dL (ref 12.0–15.0)
Potassium: 4.3 mmol/L (ref 3.5–5.1)
Potassium: 3.4 mmol/L — ABNORMAL LOW (ref 3.5–5.1)
Sodium: 139 mmol/L (ref 135–145)
Sodium: 137 mmol/L (ref 135–145)
TCO2: 25 mmol/L (ref 22–32)
TCO2: 18 mmol/L — ABNORMAL LOW (ref 22–32)

## 2024-08-20 LAB — CULTURE, BLOOD (ROUTINE X 2)
Culture: NO GROWTH
Culture: NO GROWTH

## 2024-09-17 ENCOUNTER — Encounter: Payer: Self-pay | Admitting: Cardiology

## 2024-09-17 ENCOUNTER — Ambulatory Visit: Attending: Cardiology | Admitting: Cardiology

## 2024-09-17 VITALS — BP 116/59 | HR 67 | Ht 59.0 in

## 2024-09-17 DIAGNOSIS — I5032 Chronic diastolic (congestive) heart failure: Secondary | ICD-10-CM | POA: Insufficient documentation

## 2024-09-17 DIAGNOSIS — I442 Atrioventricular block, complete: Secondary | ICD-10-CM | POA: Insufficient documentation

## 2024-09-17 DIAGNOSIS — Z950 Presence of cardiac pacemaker: Secondary | ICD-10-CM | POA: Diagnosis present

## 2024-09-17 NOTE — Progress Notes (Signed)
  Cardiology Office Note:  .   Date:  09/17/2024  ID:  [name] [name], DOB September 09, 1931, MRN [id number] PCP: Wilhelmina Cedar  [facility] Providers Cardiologist:  Candyce Reek, MD     History of Present Illness: [name]   SHARRIE [name] is a 89 y.o. female Discussed the use of AI scribe software  History of Present Illness [name] [name] is a 89 year old female with complete heart block and chronic diastolic heart failure who presents for follow-up after pacemaker generator change.  She underwent a pacemaker generator change on January 03, 2022, with reprogramming to unipolar pacing. Since the procedure, her breathing has been stable.  Her current medications include furosemide  10 mg daily, carvedilol  6.25 mg twice daily, and Crestor 40 mg daily. Her last LDL was 118 mg/dL, and her creatinine was 0.9 mg/dL as of January 16, 2024. Her diabetes is well-controlled with an A1c of 7.  She has a history of venous stasis and is wheelchair-bound following a left above-knee amputation. She has been evaluated by vascular surgery and underwent an abdominal aortogram on December 23, 2022.  She experiences memory impairment.  Lives at [facility]    Studies Reviewed: .        Results LABS LDL: 118 mg/dL Creatinine: 0.9 mg/dL (90/74/7974) Hemoglobin A1c: 7% Risk Assessment/Calculations:           Physical Exam:   VS:  BP (!) 116/59   Pulse 67   Ht 4' 11 (1.499 m)   SpO2 96%   BMI 39.93 kg/m    Wt Readings from Last 3 Encounters:  08/15/24 197 lb 11.2 oz (89.7 kg)  06/04/24 171 lb (77.6 kg)  06/01/24 174 lb 2.6 oz (79 kg)    GEN: Well nourished, well developed in no acute distress, in wheelchair.  In good spirits. NECK: No JVD; No carotid bruits CARDIAC: RRR, no murmurs, no rubs, no gallops RESPIRATORY:  Clear to auscultation without rales, wheezing or rhonchi  ABDOMEN: Soft, non-tender, non-distended EXTREMITIES:  BKA,  chronic lower extremity edema  ASSESSMENT AND  PLAN: .    Assessment and Plan Assessment & Plan Complete heart block with pacemaker in situ Complete heart block managed with a pacemaker. Recent generator change on 01/03/2022. Pacemaker reprogrammed to unipolar pacing for improved energy delivery.  EP notes reviewed.  Chronic diastolic heart failure Chronic diastolic heart failure managed with furosemide  and carvedilol . Breathing reported as good. Furosemide  10 mg daily helps manage fluid status. Carvedilol  6.25 mg twice daily helps maintain cardiac function. - Continue furosemide  10 mg daily - Continue carvedilol  6.25 mg twice daily  Type 2 diabetes mellitus Type 2 diabetes mellitus under good control with an A1c of 7.  Continue with current medication management  Hyperlipidemia Hyperlipidemia managed with Crestor. Last LDL was 118. Crestor 40 mg daily helps manage cholesterol levels. - Continue Crestor 40 mg daily         Dispo: 1 yr APP  Signed, Oneil Parchment, MD

## 2024-09-17 NOTE — Patient Instructions (Signed)
 Medication Instructions:  The current medical regimen is effective;  continue present plan and medications.  *If you need a refill on your cardiac medications before your next appointment, please call your pharmacy*  Follow-Up: At [facility], you and your health needs are our priority.  As part of our continuing mission to provide you with exceptional heart care, our providers are all part of one team.  This team includes your primary Cardiologist (physician) and Advanced Practice Providers or APPs (Physician Assistants and Nurse Practitioners) who all work together to provide you with the care you need, when you need it.  Your next appointment:   1 year(s)  Provider:   One of our Advanced Practice Providers (APPs): Morse Clause, PA-C  Lamarr Satterfield, NP Miriam Shams, NP  Olivia Pavy, PA-C Josefa Beauvais, NP  Leontine Salen, PA-C Orren Fabry, PA-C  [place], PA-C Ernest Dick, NP  Damien Braver, NP Jon Hails, PA-C  Waddell Donath, PA-C    Dayna Dunn, PA-C  Scott Weaver, PA-C Lum Louis, NP Katlyn West, NP Callie Goodrich, PA-C  Xika Zhao, NP Sheng Haley, PA-C    Kathleen Johnson, PA-C       We recommend signing up for the patient portal called MyChart.  Sign up information is provided on this After Visit Summary.  MyChart is used to connect with patients for Virtual Visits (Telemedicine).  Patients are able to view lab/test results, encounter notes, upcoming appointments, etc.  Non-urgent messages can be sent to your provider as well.   To learn more about what you can do with MyChart, go to [email or web address].

## 2024-10-01 ENCOUNTER — Telehealth: Payer: Self-pay | Admitting: Cardiology

## 2024-10-01 NOTE — Telephone Encounter (Signed)
 Returned call to daughter.  Per daughter she is not in this state.  She requests this nurse call [facility] and have nursing staff send a manual transmission.  Phone number for [facility] [phone].  Call placed to facility.  Spoke to nurse.  Requested manual transmission.  Advised Pt needs to sit by monitor and push grey button on monitor.  Will follow for transmission.

## 2024-10-01 NOTE — Telephone Encounter (Signed)
 Patient resides at [facility].   Attempted to contact patients daughter Verba) and facility. No answer, LMTCB on both VM's.  Need to clarify if symptoms are related to pacemaker.

## 2024-10-01 NOTE — Telephone Encounter (Signed)
 Daughter Verba) stated patient has been feeling very weak and wants a call back to discuss next steps.  Daughter noted patient recently had a device check.

## 2024-10-02 NOTE — Telephone Encounter (Signed)
 Attempted to contact facility. No answer, phone rings continuously.

## 2024-10-03 NOTE — Telephone Encounter (Signed)
 Attempted to contact facility. No answer. Rang and rang until a message came on and said This call cannot be completed as dialed. Please try again later. Unable to leave voicemail.

## 2024-10-04 NOTE — Telephone Encounter (Signed)
 Still have not received manual transmission from facility.  Called facility and spoke with [facility] and requested she go to patient's room and send her transmission.   I gave her directions on how to send, she is going now to attempt.  My direct number to device clinic given if she has any issues.

## 2024-10-07 ENCOUNTER — Ambulatory Visit (INDEPENDENT_AMBULATORY_CARE_PROVIDER_SITE_OTHER): Payer: Medicare Other

## 2024-10-07 DIAGNOSIS — I442 Atrioventricular block, complete: Secondary | ICD-10-CM

## 2024-10-07 LAB — CUP PACEART REMOTE DEVICE CHECK
Battery Remaining Longevity: 86 mo
Battery Voltage: 3 V
Brady Statistic AP VP Percent: 40.39 %
Brady Statistic AP VS Percent: 0 %
Brady Statistic AS VP Percent: 58.8 %
Brady Statistic AS VS Percent: 0.81 %
Brady Statistic RA Percent Paced: 40.66 %
Brady Statistic RV Percent Paced: 99.19 %
Date Time Interrogation Session: 20251116204753
Implantable Lead Connection Status: 753985
Implantable Lead Connection Status: 753985
Implantable Lead Implant Date: 20130118
Implantable Lead Implant Date: 20130118
Implantable Lead Location: 753859
Implantable Lead Location: 753860
Implantable Lead Model: 5076
Implantable Lead Model: 5076
Implantable Pulse Generator Implant Date: 20230213
Lead Channel Impedance Value: 285 Ohm
Lead Channel Impedance Value: 361 Ohm
Lead Channel Impedance Value: 399 Ohm
Lead Channel Impedance Value: 570 Ohm
Lead Channel Pacing Threshold Amplitude: 1.25 V
Lead Channel Pacing Threshold Amplitude: 2.125 V
Lead Channel Pacing Threshold Pulse Width: 0.4 ms
Lead Channel Pacing Threshold Pulse Width: 0.4 ms
Lead Channel Sensing Intrinsic Amplitude: 0.75 mV
Lead Channel Sensing Intrinsic Amplitude: 0.75 mV
Lead Channel Sensing Intrinsic Amplitude: 3.375 mV
Lead Channel Sensing Intrinsic Amplitude: 3.375 mV
Lead Channel Setting Pacing Amplitude: 2 V
Lead Channel Setting Pacing Amplitude: 2.5 V
Lead Channel Setting Pacing Pulse Width: 0.8 ms
Lead Channel Setting Sensing Sensitivity: 4 mV
Zone Setting Status: 755011
Zone Setting Status: 755011

## 2024-10-07 NOTE — Telephone Encounter (Signed)
 Remote transmission received. Normal device function. Will forward to gen cards to follow up.

## 2024-10-07 NOTE — Telephone Encounter (Signed)
 Left message for patient's daughter Raoul to callback.

## 2024-10-09 ENCOUNTER — Ambulatory Visit: Payer: Self-pay | Admitting: Internal Medicine

## 2024-10-09 NOTE — Progress Notes (Signed)
 Remote PPM Transmission

## 2024-10-10 ENCOUNTER — Telehealth: Payer: Self-pay

## 2024-10-10 NOTE — Telephone Encounter (Signed)
 Also called the nursing facility the patient resides at and let the RN Fain) know that we did receive her remote transmission and that everything looks good on it.

## 2024-10-10 NOTE — Telephone Encounter (Signed)
 Called and spoke with daughter and notified her that we received the transmission and everything is good with her mom's device   Daughter was grateful for call back

## 2024-10-10 NOTE — Telephone Encounter (Signed)
Pt daughter calling you back

## 2024-10-10 NOTE — Telephone Encounter (Signed)
 Pt daughter called in LVM on DC wanting to know details of pt latest transmission

## 2024-12-06 ENCOUNTER — Other Ambulatory Visit: Payer: Self-pay | Admitting: Vascular Surgery

## 2024-12-06 DIAGNOSIS — M79671 Pain in right foot: Secondary | ICD-10-CM

## 2024-12-10 NOTE — Progress Notes (Unsigned)
 " HISTORY AND PHYSICAL     CC:  follow up. Requesting Provider:  Wilhelmina Cedar  HPI: This is a 89 y.o. female who is here today for follow up for PAD.  Pt was originally seen 03/15/2022 by Dr. Magda for left great toe ulcer.  She was referred from the wound care center.  She is nonambulatory and a SNF resident. She subsequently underwent a left AKA on 12/26/2022 by Dr. Magda.   Pt was last seen in March 2024 and at that time, her amputation was healing and she was to follow up as needed.    The pt returns today for follow up.  ***  The pt is on a statin for cholesterol management.    The pt is not on an aspirin  (allergy).    Other AC:  none The pt is on diuretic, BB for hypertension.  The pt is is on diabetic medication. Tobacco hx:  never  Pt does *** have family hx of AAA.  Past Medical History:  Diagnosis Date   Arthritis    qwhere   AV block     2:1  January 14, 2011 / pacemaker plan when cellulitis resolved in length   Bradycardia    2:1  AV block   Cellulitis    Superficial cellulitis right lower leg   January 14, 2011   Chronic diastolic CHF (congestive heart failure) (HCC)    Diabetes mellitus without complication (HCC)    Edema    . EF 65-70%, echo, November 29, 2010   Ejection fraction    EF 65%, echo, January, 2012   Gout    Hypertension    Hypokalemia    October, 2012   Murmur    No significant valvular disease by echo, January, 2012   Pacemaker    January, 2013 Medtronic Sensa   Pulmonary hypertension (HCC)     46 mmHg... echo... January, 2012    Past Surgical History:  Procedure Laterality Date   ABDOMINAL AORTOGRAM W/LOWER EXTREMITY Left 12/23/2022   Procedure: ABDOMINAL AORTOGRAM W/LOWER EXTREMITY;  Surgeon: Magda Debby SAILOR, MD;  Location: [facility] INVASIVE CV LAB;  Service: Cardiovascular;  Laterality: Left;   AMPUTATION Left 12/26/2022   Procedure: AMPUTATION ABOVE KNEE;  Surgeon: Magda Debby SAILOR, MD;  Location: [facility] OR;  Service: Vascular;  Laterality:  Left;   APPLICATION OF WOUND VAC Left 12/26/2022   Procedure: APPLICATION OF WOUND VAC;  Surgeon: Magda Debby SAILOR, MD;  Location: [facility] OR;  Service: Vascular;  Laterality: Left;   CHOLECYSTECTOMY     HIP ARTHROPLASTY Right    INSERT / REPLACE / REMOVE PACEMAKER  11/2011   PARTIAL HYSTERECTOMY     vaginal   PERMANENT PACEMAKER INSERTION N/A 12/09/2011   Procedure: PERMANENT PACEMAKER INSERTION;  Surgeon: Danelle LELON [place], MD;  Location: [facility] CATH LAB;  Service: Cardiovascular;  Laterality: N/A;   PPM GENERATOR CHANGEOUT N/A 01/03/2022   Procedure: PPM GENERATOR CHANGEOUT;  Surgeon: Birmingham Danelle LELON, MD;  Location: [place] INVASIVE CV LAB;  Service: Cardiovascular;  Laterality: N/A;    Allergies[1]  Current Outpatient Medications  Medication Sig Dispense Refill   albuterol (VENTOLIN HFA) 108 (90 Base) MCG/ACT inhaler Inhale 2 puffs into the lungs every 4 (four) hours as needed for shortness of breath.     allopurinol  (ZYLOPRIM ) 100 MG tablet Take 100 mg by mouth daily.     ammonium lactate (AMLACTIN) 12 % cream Apply 1 Application topically in the morning and at bedtime.     Biotin   5000 MCG CAPS Take 5,000 mcg by mouth daily. (0900)     carvedilol  (COREG ) 6.25 MG tablet Take 6.25 mg by mouth 2 (two) times daily with a meal. Hold for HR <60 (0900 & 1700)     cetirizine (ZYRTEC) 10 MG tablet Take 10 mg by mouth daily.     Cholecalciferol  (VITAMIN D3) 50 MCG (2000 UT) TABS Take 50 mcg by mouth daily. 0900     colchicine  0.6 MG tablet Take 0.6 mg by mouth 2 (two) times daily.     Cranberry 300 MG tablet Take 300 mg by mouth daily.     cyclobenzaprine  (FLEXERIL ) 5 MG tablet Take 1 tablet (5 mg total) by mouth 3 (three) times daily as needed for muscle spasms. (Patient taking differently: Take 5 mg by mouth every 8 (eight) hours as needed for muscle spasms.) 30 tablet 0   Emollient (EUCERIN) lotion Apply 1 Application topically every 12 (twelve) hours as needed for dry skin. Apply to Rt foot/ankle/leg      estradiol  (ESTRACE ) 0.1 MG/GM vaginal cream Discard plastic applicator. Insert a blueberry size amount (approximately 1 gram) of cream on gloved fingertip inside vagina at bedtime every night. For long term use. (Patient taking differently: Place 1 g vaginally every Monday, Wednesday, and Friday. Discard plastic applicator. Insert a blueberry size amount (approximately 1 gram) of cream on gloved fingertip inside vagina at bedtime every night. For long term use.) 42.5 g 5   ferrous sulfate 325 (65 FE) MG EC tablet Take 325 mg by mouth daily with breakfast.     furosemide  (LASIX ) 20 MG tablet Take 10 mg by mouth daily.     Magnesium  250 MG TABS Take 1 tablet by mouth daily.     melatonin 3 MG TABS tablet Take 3 mg by mouth at bedtime. (2100)     memantine (NAMENDA) 5 MG tablet Take 5 mg by mouth daily.     Menthol, Topical Analgesic, (BIOFREEZE COLORLESS EX) Apply 1 application  topically in the morning, at noon, and at bedtime. for neck pain     nitroGLYCERIN  (NITROSTAT ) 0.4 MG SL tablet Place 0.4 mg under the tongue every 5 (five) minutes as needed for chest pain (X 3 DOSES FOR CHEST PAIN).     oxyCODONE  (OXY IR/ROXICODONE ) 5 MG immediate release tablet Take 1 tablet (5 mg total) by mouth every 4 (four) hours as needed for moderate pain or severe pain (1 tablet for pain 4-6, 2 tablet for pain 7-10). (Patient taking differently: Take 5 mg by mouth every 8 (eight) hours as needed for severe pain (pain score 7-10).) 20 tablet 0   OXYGEN Inhale 2 L/min into the lungs continuous. Keep O2 saturation greater than 90%     pantoprazole  (PROTONIX ) 40 MG tablet Take 40 mg by mouth daily. 0830     polyethylene glycol powder (GLYCOLAX /MIRALAX ) powder Take 17 g by mouth daily. (0900)     potassium chloride  (KLOR-CON ) 10 MEQ tablet Take 10 mEq by mouth daily.     pregabalin (LYRICA) 25 MG capsule Take 25 mg by mouth 2 (two) times daily.     rosuvastatin (CRESTOR) 40 MG tablet Take 40 mg by mouth daily.      sitaGLIPtin (JANUVIA) 50 MG tablet Take 50 mg by mouth in the morning. (0900)     sucralfate (CARAFATE) 1 g tablet Take 1 g by mouth with breakfast, with lunch, and with evening meal.     Vitamins A & D (VITAMIN A & D)  ointment Apply 1 Application topically See admin instructions. Apply to buttocks/gluteal folds/Rt rear thigh for prevention of moisture associated skin damage. Apply after incontinence care every shift. MAR has 3 daily applications for this med as well as a PRN order every 12 hours as needed.     No current facility-administered medications for this visit.    Family History  Problem Relation Age of Onset   Cardiomyopathy Father    Diabetes Father    Heart failure Father    Dementia Mother    Cancer Son    Coronary artery disease Son    Heart attack Neg Hx     Social History   Socioeconomic History   Marital status: Widowed    Spouse name: Not on file   Number of children: Not on file   Years of education: Not on file   Highest education level: Not on file  Occupational History   Occupation: Retired    Associate Professor: RETIRED  Tobacco Use   Smoking status: Never   Smokeless tobacco: Never  Vaping Use   Vaping status: Never Used  Substance and Sexual Activity   Alcohol use: No   Drug use: No   Sexual activity: Never  Other Topics Concern   Not on file  Social History Narrative   Retired    Widowed    Tobacco Use - No.    Alcohol Use - no   Regular Exercise - no   Drug Use - no   Social Drivers of Health   Tobacco Use: Low Risk (09/17/2024)   Patient History    Smoking Tobacco Use: Never    Smokeless Tobacco Use: Never    Passive Exposure: Not on file  Financial Resource Strain: Not on file  Food Insecurity: Not on file  Transportation Needs: Not on file  Physical Activity: Not on file  Stress: Not on file  Social Connections: Not on file  Intimate Partner Violence: Not on file  Depression (PHQ2-9): Not on file  Alcohol Screen: Not on file   Housing: Not on file  Utilities: Not on file  Health Literacy: Not on file     REVIEW OF SYSTEMS:  *** [X]  denotes positive finding, [ ]  denotes negative finding Cardiac  Comments:  Chest pain or chest pressure:    Shortness of breath upon exertion:    Short of breath when lying flat:    Irregular heart rhythm:        Vascular    Pain in calf, thigh, or hip brought on by ambulation:    Pain in feet at night that wakes you up from your sleep:     Blood clot in your veins:    Leg swelling:         Pulmonary    Oxygen at home:    Productive cough:     Wheezing:         Neurologic    Sudden weakness in arms or legs:     Sudden numbness in arms or legs:     Sudden onset of difficulty speaking or slurred speech:    Temporary loss of vision in one eye:     Problems with dizziness:         Gastrointestinal    Blood in stool:     Vomited blood:         Genitourinary    Burning when urinating:     Blood in urine:        Psychiatric  Major depression:         Hematologic    Bleeding problems:    Problems with blood clotting too easily:        Skin    Rashes or ulcers:        Constitutional    Fever or chills:      PHYSICAL EXAMINATION:  ***  General:  WDWN in NAD; vital signs documented above Gait: Not observed HENT: WNL, normocephalic Pulmonary: normal non-labored breathing , without wheezing Cardiac: {Desc; regular/irreg:14544} HR, {With/Without:20273} carotid bruit*** Abdomen: soft, NT; aortic pulse is *** palpable Skin: {With/Without:20273} rashes Vascular Exam/Pulses:  Right Left  Radial {Exam; arterial pulse strength 0-4:30167} {Exam; arterial pulse strength 0-4:30167}  Femoral {Exam; arterial pulse strength 0-4:30167} {Exam; arterial pulse strength 0-4:30167}  Popliteal {Exam; arterial pulse strength 0-4:30167} {Exam; arterial pulse strength 0-4:30167}  DP {Exam; arterial pulse strength 0-4:30167} {Exam; arterial pulse strength 0-4:30167}  PT  {Exam; arterial pulse strength 0-4:30167} {Exam; arterial pulse strength 0-4:30167}  Peroneal *** ***   Extremities: {With/Without:20273} ischemic changes, {With/Without:20273} Gangrene , {With/Without:20273} cellulitis; {With/Without:20273} open wounds Musculoskeletal: no muscle wasting or atrophy  Neurologic: A&O X 3 Psychiatric:  The pt has {Desc; normal/abnormal:11317::Normal} affect.   Non-Invasive Vascular Imaging:   ABI's/TBI's on 12/11/2024: Right:  *** - Great toe pressure: *** Left:  *** - Great toe pressure: ***   Previous ABI's/TBI's on 02/18/2022: Right:  0.74/0.38 - Great toe pressure: 54 Left:  0.49/absent - Great toe pressure:  absent      ASSESSMENT/PLAN:: 89 y.o. female here for follow up for PAD with hx of  left AKA on 12/26/2022 by Dr. Magda.    -*** -continue *** -discussed importance of increased walking daily -pt will f/u in *** with ***.   Lucie Apt, [facility] [phone]  Clinic MD:   Pearline on call MD    [1]  Allergies Allergen Reactions   Aspirin  Rash and Other (See Comments)    GI distress   Tramadol  Other (See Comments)    GI distress   Naproxen Other (See Comments)    Unknown reaction   Shellfish Protein-Containing Drug Products Itching and Rash   "

## 2024-12-11 ENCOUNTER — Ambulatory Visit

## 2024-12-11 ENCOUNTER — Ambulatory Visit (HOSPITAL_BASED_OUTPATIENT_CLINIC_OR_DEPARTMENT_OTHER)
Admission: RE | Admit: 2024-12-11 | Discharge: 2024-12-11 | Disposition: A | Discharge status: Home / Self Care | Source: Ambulatory Visit | Attending: Vascular Surgery | Admitting: Vascular Surgery

## 2024-12-11 VITALS — BP 123/63 | HR 85 | Temp 97.6°F

## 2024-12-11 DIAGNOSIS — M79671 Pain in right foot: Secondary | ICD-10-CM | POA: Insufficient documentation

## 2024-12-11 LAB — VAS US ABI WITH/WO TBI: Right ABI: 0.55

## 2024-12-12 ENCOUNTER — Ambulatory Visit: Payer: Medicare Other | Admitting: Urology

## 2024-12-13 ENCOUNTER — Emergency Department (HOSPITAL_COMMUNITY)

## 2024-12-13 ENCOUNTER — Inpatient Hospital Stay (HOSPITAL_COMMUNITY)
Admission: EM | Admit: 2024-12-13 | Discharge: 2024-12-17 | DRG: 871 | Disposition: A | Discharge status: Skilled Nursing Facility | Source: Skilled Nursing Facility | Attending: Internal Medicine | Admitting: Internal Medicine

## 2024-12-13 ENCOUNTER — Other Ambulatory Visit: Payer: Self-pay

## 2024-12-13 ENCOUNTER — Encounter (HOSPITAL_COMMUNITY): Payer: Self-pay | Admitting: *Deleted

## 2024-12-13 DIAGNOSIS — F419 Anxiety disorder, unspecified: Secondary | ICD-10-CM | POA: Diagnosis present

## 2024-12-13 DIAGNOSIS — A419 Sepsis, unspecified organism: Principal | ICD-10-CM | POA: Diagnosis present

## 2024-12-13 DIAGNOSIS — Z96641 Presence of right artificial hip joint: Secondary | ICD-10-CM | POA: Diagnosis present

## 2024-12-13 DIAGNOSIS — Z9981 Dependence on supplemental oxygen: Secondary | ICD-10-CM | POA: Diagnosis not present

## 2024-12-13 DIAGNOSIS — Z833 Family history of diabetes mellitus: Secondary | ICD-10-CM | POA: Diagnosis not present

## 2024-12-13 DIAGNOSIS — R6521 Severe sepsis with septic shock: Secondary | ICD-10-CM | POA: Diagnosis present

## 2024-12-13 DIAGNOSIS — E11649 Type 2 diabetes mellitus with hypoglycemia without coma: Secondary | ICD-10-CM | POA: Diagnosis present

## 2024-12-13 DIAGNOSIS — J9601 Acute respiratory failure with hypoxia: Secondary | ICD-10-CM

## 2024-12-13 DIAGNOSIS — Z8249 Family history of ischemic heart disease and other diseases of the circulatory system: Secondary | ICD-10-CM | POA: Diagnosis not present

## 2024-12-13 DIAGNOSIS — Z79899 Other long term (current) drug therapy: Secondary | ICD-10-CM

## 2024-12-13 DIAGNOSIS — N390 Urinary tract infection, site not specified: Secondary | ICD-10-CM | POA: Diagnosis not present

## 2024-12-13 DIAGNOSIS — I959 Hypotension, unspecified: Secondary | ICD-10-CM | POA: Diagnosis not present

## 2024-12-13 DIAGNOSIS — Z66 Do not resuscitate: Secondary | ICD-10-CM | POA: Diagnosis present

## 2024-12-13 DIAGNOSIS — E1142 Type 2 diabetes mellitus with diabetic polyneuropathy: Secondary | ICD-10-CM | POA: Diagnosis present

## 2024-12-13 DIAGNOSIS — Z886 Allergy status to analgesic agent status: Secondary | ICD-10-CM

## 2024-12-13 DIAGNOSIS — Z22322 Carrier or suspected carrier of Methicillin resistant Staphylococcus aureus: Secondary | ICD-10-CM

## 2024-12-13 DIAGNOSIS — J9621 Acute and chronic respiratory failure with hypoxia: Secondary | ICD-10-CM | POA: Diagnosis present

## 2024-12-13 DIAGNOSIS — E1165 Type 2 diabetes mellitus with hyperglycemia: Secondary | ICD-10-CM | POA: Diagnosis not present

## 2024-12-13 DIAGNOSIS — I110 Hypertensive heart disease with heart failure: Secondary | ICD-10-CM | POA: Diagnosis present

## 2024-12-13 DIAGNOSIS — I5032 Chronic diastolic (congestive) heart failure: Secondary | ICD-10-CM | POA: Diagnosis present

## 2024-12-13 DIAGNOSIS — I272 Pulmonary hypertension, unspecified: Secondary | ICD-10-CM | POA: Diagnosis present

## 2024-12-13 DIAGNOSIS — Z89512 Acquired absence of left leg below knee: Secondary | ICD-10-CM

## 2024-12-13 DIAGNOSIS — E872 Acidosis, unspecified: Secondary | ICD-10-CM | POA: Diagnosis present

## 2024-12-13 DIAGNOSIS — Z79890 Hormone replacement therapy (postmenopausal): Secondary | ICD-10-CM

## 2024-12-13 DIAGNOSIS — E876 Hypokalemia: Secondary | ICD-10-CM | POA: Diagnosis present

## 2024-12-13 DIAGNOSIS — R9431 Abnormal electrocardiogram [ECG] [EKG]: Secondary | ICD-10-CM

## 2024-12-13 DIAGNOSIS — Z91013 Allergy to seafood: Secondary | ICD-10-CM

## 2024-12-13 DIAGNOSIS — E8809 Other disorders of plasma-protein metabolism, not elsewhere classified: Secondary | ICD-10-CM | POA: Diagnosis present

## 2024-12-13 DIAGNOSIS — N3001 Acute cystitis with hematuria: Secondary | ICD-10-CM | POA: Diagnosis present

## 2024-12-13 DIAGNOSIS — R7401 Elevation of levels of liver transaminase levels: Secondary | ICD-10-CM | POA: Diagnosis present

## 2024-12-13 DIAGNOSIS — Z7984 Long term (current) use of oral hypoglycemic drugs: Secondary | ICD-10-CM

## 2024-12-13 DIAGNOSIS — Z9049 Acquired absence of other specified parts of digestive tract: Secondary | ICD-10-CM

## 2024-12-13 DIAGNOSIS — Z950 Presence of cardiac pacemaker: Secondary | ICD-10-CM | POA: Diagnosis not present

## 2024-12-13 DIAGNOSIS — G8929 Other chronic pain: Secondary | ICD-10-CM | POA: Diagnosis present

## 2024-12-13 LAB — URINALYSIS, ROUTINE W REFLEX MICROSCOPIC
Bilirubin Urine: NEGATIVE
Glucose, UA: NEGATIVE mg/dL
Ketones, ur: NEGATIVE mg/dL
Nitrite: NEGATIVE
Protein, ur: 100 mg/dL — AB
RBC / HPF: >50 RBC/hpf (ref 0–5)
Specific Gravity, Urine: 1.009 (ref 1.005–1.030)
WBC, UA: >50 WBC/hpf (ref 0–5)
pH: 7 (ref 5.0–8.0)

## 2024-12-13 LAB — CBG MONITORING, ED
Glucose-Capillary: 32 mg/dL — CL (ref 70–99)
Glucose-Capillary: 279 mg/dL — ABNORMAL HIGH (ref 70–99)

## 2024-12-13 LAB — CBC WITH DIFFERENTIAL/PLATELET
Abs Immature Granulocytes: 0.05 K/uL (ref 0.00–0.07)
Basophils Absolute: 0 K/uL (ref 0.0–0.1)
Basophils Relative: 0 %
Eosinophils Absolute: 0 K/uL (ref 0.0–0.5)
Eosinophils Relative: 1 %
HCT: 34.8 % — ABNORMAL LOW (ref 36.0–46.0)
Hemoglobin: 11.3 g/dL — ABNORMAL LOW (ref 12.0–15.0)
Immature Granulocytes: 1 %
Lymphocytes Relative: 17 %
Lymphs Abs: 1.2 K/uL (ref 0.7–4.0)
MCH: 28.9 pg (ref 26.0–34.0)
MCHC: 32.5 g/dL (ref 30.0–36.0)
MCV: 89 fL (ref 80.0–100.0)
Monocytes Absolute: 0.7 K/uL (ref 0.1–1.0)
Monocytes Relative: 10 %
Neutro Abs: 5.2 K/uL (ref 1.7–7.7)
Neutrophils Relative %: 71 %
Platelets: 156 K/uL (ref 150–400)
RBC: 3.91 MIL/uL (ref 3.87–5.11)
RDW: 19 % — ABNORMAL HIGH (ref 11.5–15.5)
WBC: 7.2 K/uL (ref 4.0–10.5)
nRBC: 0.4 % — ABNORMAL HIGH (ref 0.0–0.2)

## 2024-12-13 LAB — COMPREHENSIVE METABOLIC PANEL WITH GFR
ALT: 35 U/L (ref 0–44)
AST: 73 U/L — ABNORMAL HIGH (ref 15–41)
Albumin: 2.6 g/dL — ABNORMAL LOW (ref 3.5–5.0)
Alkaline Phosphatase: 191 U/L — ABNORMAL HIGH (ref 38–126)
Anion gap: 14 (ref 5–15)
BUN: 17 mg/dL (ref 8–23)
CO2: 25 mmol/L (ref 22–32)
Calcium: 8.7 mg/dL — ABNORMAL LOW (ref 8.9–10.3)
Chloride: 96 mmol/L — ABNORMAL LOW (ref 98–111)
Creatinine, Ser: 1.02 mg/dL — ABNORMAL HIGH (ref 0.44–1.00)
GFR, Estimated: 52 mL/min — ABNORMAL LOW
Glucose, Bld: 120 mg/dL — ABNORMAL HIGH (ref 70–99)
Potassium: 2.8 mmol/L — ABNORMAL LOW (ref 3.5–5.1)
Sodium: 135 mmol/L (ref 135–145)
Total Bilirubin: 0.4 mg/dL (ref 0.0–1.2)
Total Protein: 6.8 g/dL (ref 6.5–8.1)

## 2024-12-13 LAB — LACTIC ACID, PLASMA
Lactic Acid, Venous: 3.2 mmol/L (ref 0.5–1.9)
Lactic Acid, Venous: 3.7 mmol/L (ref 0.5–1.9)

## 2024-12-13 LAB — MAGNESIUM: Magnesium: 2 mg/dL (ref 1.7–2.4)

## 2024-12-13 MED ORDER — POTASSIUM CHLORIDE CRYS ER 20 MEQ PO TBCR: 40.0000 meq | EXTENDED_RELEASE_TABLET | Freq: Once | ORAL | Status: DC

## 2024-12-13 MED ORDER — NOREPINEPHRINE 4 MG/250ML-% IV SOLN
0.0000 ug/min | INTRAVENOUS | Status: DC
  Administered 2024-12-13: 4 ug/min via INTRAVENOUS
  Administered 2024-12-14: 6 ug/min via INTRAVENOUS
  Administered 2024-12-15: 4 ug/min via INTRAVENOUS
  Filled 2024-12-13 (×2): qty 250

## 2024-12-13 MED ORDER — SODIUM CHLORIDE 0.9 % IV SOLN: 250.0000 mL | INTRAVENOUS | Status: AC

## 2024-12-13 MED ORDER — SODIUM CHLORIDE 0.9 % IV BOLUS
1000.0000 mL | Freq: Once | INTRAVENOUS | Status: AC
  Administered 2024-12-13: 1000 mL via INTRAVENOUS

## 2024-12-13 MED ORDER — SODIUM CHLORIDE 0.9 % IV SOLN
2.0000 g | Freq: Once | INTRAVENOUS | Status: AC
  Administered 2024-12-13: 2 g via INTRAVENOUS
  Filled 2024-12-13: qty 20

## 2024-12-13 MED ORDER — DEXTROSE 50 % IV SOLN: 1.0000 | Freq: Once | INTRAVENOUS | Status: AC

## 2024-12-13 MED ORDER — LACTATED RINGERS IV BOLUS
500.0000 mL | Freq: Once | INTRAVENOUS | Status: AC
  Administered 2024-12-13: 500 mL via INTRAVENOUS

## 2024-12-13 MED ORDER — NOREPINEPHRINE 4 MG/250ML-% IV SOLN
INTRAVENOUS | Status: AC
  Filled 2024-12-13: qty 250

## 2024-12-13 MED ORDER — DEXTROSE 50 % IV SOLN
INTRAVENOUS | Status: AC
  Administered 2024-12-13: 50 mL via INTRAVENOUS
  Filled 2024-12-13: qty 50

## 2024-12-13 MED ORDER — LACTATED RINGERS IV SOLN: INTRAVENOUS | Status: AC

## 2024-12-13 MED ORDER — POTASSIUM CHLORIDE 10 MEQ/100ML IV SOLN
10.0000 meq | INTRAVENOUS | Status: AC
  Administered 2024-12-13 (×2): 10 meq via INTRAVENOUS
  Filled 2024-12-13: qty 100

## 2024-12-13 MED ORDER — CHLORHEXIDINE GLUCONATE CLOTH 2 % EX PADS
6.0000 | MEDICATED_PAD | Freq: Every day | CUTANEOUS | Status: DC
  Administered 2024-12-13 – 2024-12-17 (×5): 6 via TOPICAL

## 2024-12-13 NOTE — ED Notes (Signed)
 PT fluids were opened back up to gravity. Pt blood pressure has went back down PA requested giving a quick bolus.

## 2024-12-13 NOTE — ED Triage Notes (Signed)
 Pt BIB [facility] from [facility] for c/o weakness and abnormal lab;  pt's potassium at the facility is 2.7  Pt states she has been nauseous all day

## 2024-12-13 NOTE — ED Provider Notes (Signed)
 " [name] EMERGENCY DEPARTMENT AT [facility] Provider Note   CSN: [id number] Arrival date & time: 12/13/24  1633     Patient presents with: Weakness   [name] is a 89 y.o. female. History of chronic diastolic CHF, hypertension, anxiety, pacemaker, type 2 diabetes, left BKA.  Presents the ER today for evaluation of generalized weakness and potassium 2.7, her son is at bedside states she has not been eating or drinking well for the past few weeks, worse in the past couple of days.  He states that he thinks she is given up.  Notes that he would not want her to have CPR or intubation if she worsened.    Weakness      Prior to Admission medications  Medication Sig Start Date End Date Taking? Authorizing Provider  albuterol (VENTOLIN HFA) 108 (90 Base) MCG/ACT inhaler Inhale 2 puffs into the lungs every 4 (four) hours as needed for shortness of breath.    [provider]  allopurinol  (ZYLOPRIM ) 100 MG tablet Take 100 mg by mouth daily.    [provider]  ammonium lactate (AMLACTIN) 12 % cream Apply 1 Application topically in the morning and at bedtime.    [provider]  Biotin  5000 MCG CAPS Take 5,000 mcg by mouth daily. (0900)    [provider]  carvedilol  (COREG ) 6.25 MG tablet Take 6.25 mg by mouth 2 (two) times daily with a meal. Hold for HR <60 (0900 & 1700)    [provider]  cetirizine (ZYRTEC) 10 MG tablet Take 10 mg by mouth daily.    [provider]  Cholecalciferol  (VITAMIN D3) 50 MCG (2000 UT) TABS Take 50 mcg by mouth daily. 0900    [provider]  colchicine  0.6 MG tablet Take 0.6 mg by mouth 2 (two) times daily.    [provider]  Cranberry 300 MG tablet Take 300 mg by mouth daily.    [provider]  cyclobenzaprine  (FLEXERIL ) 5 MG tablet Take 1 tablet (5 mg total) by mouth 3 (three) times daily as needed for muscle spasms. Patient taking differently: Take 5 mg  by mouth every 8 (eight) hours as needed for muscle spasms. 10/13/23   Raford Lenis, MD  Emollient (EUCERIN) lotion Apply 1 Application topically every 12 (twelve) hours as needed for dry skin. Apply to Rt foot/ankle/leg    [provider]  estradiol  (ESTRACE ) 0.1 MG/GM vaginal cream Discard plastic applicator. Insert a blueberry size amount (approximately 1 gram) of cream on gloved fingertip inside vagina at bedtime every night. For long term use. Patient taking differently: Place 1 g vaginally every Monday, Wednesday, and Friday. Discard plastic applicator. Insert a blueberry size amount (approximately 1 gram) of cream on gloved fingertip inside vagina at bedtime every night. For long term use. 09/05/23   Gerldine Lauraine BROCKS, FNP  ferrous sulfate 325 (65 FE) MG EC tablet Take 325 mg by mouth daily with breakfast.    [provider]  furosemide  (LASIX ) 20 MG tablet Take 10 mg by mouth daily.    [provider]  Magnesium  250 MG TABS Take 1 tablet by mouth daily.    [provider]  melatonin 3 MG TABS tablet Take 3 mg by mouth at bedtime. (2100)    [provider]  memantine (NAMENDA) 5 MG tablet Take 5 mg by mouth daily.    [provider]  Menthol, Topical Analgesic, (BIOFREEZE COLORLESS EX) Apply 1 application  topically in  the morning, at noon, and at bedtime. for neck pain    [provider]  nitroGLYCERIN  (NITROSTAT ) 0.4 MG SL tablet Place 0.4 mg under the tongue every 5 (five) minutes as needed for chest pain (X 3 DOSES FOR CHEST PAIN).    [provider]  oxyCODONE  (OXY IR/ROXICODONE ) 5 MG immediate release tablet Take 1 tablet (5 mg total) by mouth every 4 (four) hours as needed for moderate pain or severe pain (1 tablet for pain 4-6, 2 tablet for pain 7-10). Patient taking differently: Take 5 mg by mouth every 8 (eight) hours as needed for severe pain (pain score 7-10). 12/31/22   Ghimire, Kuber, MD  OXYGEN Inhale 2 L/min  into the lungs continuous. Keep O2 saturation greater than 90%    [provider]  pantoprazole  (PROTONIX ) 40 MG tablet Take 40 mg by mouth daily. 0830    [provider]  polyethylene glycol powder (GLYCOLAX /MIRALAX ) powder Take 17 g by mouth daily. (0900)    [provider]  potassium chloride  (KLOR-CON ) 10 MEQ tablet Take 10 mEq by mouth daily.    [provider]  pregabalin (LYRICA) 25 MG capsule Take 25 mg by mouth 2 (two) times daily.    [provider]  rosuvastatin (CRESTOR) 40 MG tablet Take 40 mg by mouth daily.    [provider]  sitaGLIPtin (JANUVIA) 50 MG tablet Take 50 mg by mouth in the morning. (0900)    [provider]  sucralfate (CARAFATE) 1 g tablet Take 1 g by mouth with breakfast, with lunch, and with evening meal.    [provider]  Vitamins A & D (VITAMIN A & D) ointment Apply 1 Application topically See admin instructions. Apply to buttocks/gluteal folds/Rt rear thigh for prevention of moisture associated skin damage. Apply after incontinence care every shift. MAR has 3 daily applications for this med as well as a PRN order every 12 hours as needed.    [provider]    Allergies: Aspirin , Tramadol , Naproxen, and Shellfish protein-containing drug products    Review of Systems  Neurological:  Positive for weakness.    Updated Vital Signs BP (!) 130/30   Pulse (!) 58   Temp (!) 97.5 F (36.4 C) (Oral)   Resp 14   SpO2 99%   Physical Exam Vitals and nursing note reviewed.  Constitutional:      General: She is not in acute distress.    Appearance: She is well-developed.     Comments: Chronically ill   HENT:     Head: Normocephalic and atraumatic.     Mouth/Throat:     Mouth: Mucous membranes are dry.  Eyes:     Extraocular Movements: Extraocular movements intact.     Conjunctiva/sclera: Conjunctivae normal.     Pupils: Pupils are equal, round, and reactive to light.   Cardiovascular:     Rate and Rhythm: Normal rate and regular rhythm.     Heart sounds: No murmur heard. Pulmonary:     Effort: Pulmonary effort is normal. No respiratory distress.     Breath sounds: Normal breath sounds.  Abdominal:     Palpations: Abdomen is soft.     Tenderness: There is no abdominal tenderness.  Musculoskeletal:        General: No swelling. Normal range of motion.     Cervical back: Neck supple.  Skin:    General: Skin is warm and dry.     Capillary Refill: Capillary refill takes less than  2 seconds.  Neurological:     General: No focal deficit present.     Mental Status: She is alert and oriented to person, place, and time.  Psychiatric:        Mood and Affect: Mood normal.     (all labs ordered are listed, but only abnormal results are displayed) Labs Reviewed  MRSA NEXT GEN BY PCR, NASAL - Abnormal; Notable for the following components:      Result Value   MRSA by PCR Next Gen DETECTED (*)    All other components within normal limits  COMPREHENSIVE METABOLIC PANEL WITH GFR - Abnormal; Notable for the following components:   Potassium 2.8 (*)    Chloride 96 (*)    Glucose, Bld 120 (*)    Creatinine, Ser 1.02 (*)    Calcium  8.7 (*)    Albumin  2.6 (*)    AST 73 (*)    Alkaline Phosphatase 191 (*)    GFR, Estimated 52 (*)    All other components within normal limits  CBC WITH DIFFERENTIAL/PLATELET - Abnormal; Notable for the following components:   Hemoglobin 11.3 (*)    HCT 34.8 (*)    RDW 19.0 (*)    nRBC 0.4 (*)    All other components within normal limits  URINALYSIS, ROUTINE W REFLEX MICROSCOPIC - Abnormal; Notable for the following components:   APPearance TURBID (*)    Hgb urine dipstick LARGE (*)    Protein, ur 100 (*)    Leukocytes,Ua LARGE (*)    Bacteria, UA MANY (*)    All other components within normal limits  LACTIC ACID, PLASMA - Abnormal; Notable for the following components:   Lactic Acid, Venous 3.2 (*)    All other  components within normal limits  LACTIC ACID, PLASMA - Abnormal; Notable for the following components:   Lactic Acid, Venous 3.7 (*)    All other components within normal limits  COMPREHENSIVE METABOLIC PANEL WITH GFR - Abnormal; Notable for the following components:   Potassium 2.6 (*)    Glucose, Bld 100 (*)    Calcium  8.0 (*)    Total Protein 5.9 (*)    Albumin  2.3 (*)    AST 62 (*)    Alkaline Phosphatase 167 (*)    GFR, Estimated 57 (*)    All other components within normal limits  CBC - Abnormal; Notable for the following components:   RBC 3.40 (*)    Hemoglobin 9.9 (*)    HCT 30.6 (*)    RDW 19.1 (*)    Platelets 144 (*)    All other components within normal limits  PHOSPHORUS - Abnormal; Notable for the following components:   Phosphorus 1.8 (*)    All other components within normal limits  LACTIC ACID, PLASMA - Abnormal; Notable for the following components:   Lactic Acid, Venous 2.6 (*)    All other components within normal limits  GLUCOSE, CAPILLARY - Abnormal; Notable for the following components:   Glucose-Capillary 15 (*)    All other components within normal limits  GLUCOSE, CAPILLARY - Abnormal; Notable for the following components:   Glucose-Capillary 120 (*)    All other components within normal limits  GLUCOSE, CAPILLARY - Abnormal; Notable for the following components:   Glucose-Capillary 142 (*)    All other components within normal limits  GLUCOSE, CAPILLARY - Abnormal; Notable for the following components:   Glucose-Capillary 19 (*)    All other components within normal limits  GLUCOSE, CAPILLARY -  Abnormal; Notable for the following components:   Glucose-Capillary 116 (*)    All other components within normal limits  GLUCOSE, CAPILLARY - Abnormal; Notable for the following components:   Glucose-Capillary 63 (*)    All other components within normal limits  CBG MONITORING, ED - Abnormal; Notable for the following components:   Glucose-Capillary 32  (*)    All other components within normal limits  CBG MONITORING, ED - Abnormal; Notable for the following components:   Glucose-Capillary 279 (*)    All other components within normal limits  CULTURE, BLOOD (ROUTINE X 2)  CULTURE, BLOOD (ROUTINE X 2)  URINE CULTURE  MAGNESIUM   MAGNESIUM   LACTIC ACID, PLASMA  GLUCOSE, CAPILLARY  HEMOGLOBIN A1C  CBC WITH DIFFERENTIAL/PLATELET  COMPREHENSIVE METABOLIC PANEL WITH GFR  MAGNESIUM   PHOSPHORUS    EKG: EKG Interpretation Date/Time:  Friday December 13 2024 18:37:40 EST Ventricular Rate:  99 PR Interval:  98 QRS Duration:  189 QT Interval:  420 QTC Calculation: 539 R Axis:   161  Text Interpretation: paced rhythm Right bundle branch block Consider left ventricular hypertrophy Artifact in lead(s) I III aVR aVL V2 Confirmed by Freddi Hamilton [phone]) on 12/13/2024 7:09:22 PM  Radiology: DG Chest Portable 1 View Result Date: 12/13/2024 EXAM: 1 VIEW(S) XRAY OF THE CHEST 12/13/2024 05:35:02 PM COMPARISON: 08/15/2024 CLINICAL HISTORY: Fatigue, weakness, and low potassium. FINDINGS: LINES, TUBES AND DEVICES: Left chest pacemaker with leads overlying right atrium and right ventricle. LUNGS AND PLEURA: Low lung volumes. No focal pulmonary opacity. No pleural effusion. No pneumothorax. HEART AND MEDIASTINUM: No acute abnormality of the cardiac and mediastinal silhouettes. BONES AND SOFT TISSUES: Right upper quadrant surgical clips. No acute osseous abnormality. IMPRESSION: 1. No acute findings. Electronically signed by: Oneil Devonshire MD 12/13/2024 05:42 PM EST RP Workstation: [id number]     Procedures   Medications Ordered in the ED  potassium chloride  SA (KLOR-CON  M) CR tablet 40 mEq (40 mEq Oral Not Given 12/13/24 2007)  Chlorhexidine  Gluconate Cloth 2 % PADS 6 each (6 each Topical Given 12/14/24 0818)  0.9 %  sodium chloride  infusion (has no administration in time range)  norepinephrine  (LEVOPHED ) 4mg  in (0.016 mg/mL) premix infusion (4  mcg/min Intravenous Infusion Verify 12/14/24 1613)  lactated ringers  infusion ( Intravenous Stopped 12/14/24 1210)  norepinephrine  (LEVOPHED ) 4-5 MG/250ML-% infusion SOLN (has no administration in time range)  diclofenac  Sodium (VOLTAREN ) 1 % topical gel 2 g (2 g Topical Given 12/14/24 0124)  heparin  injection 5,000 Units (5,000 Units Subcutaneous Given 12/14/24 1252)  acetaminophen  (TYLENOL ) tablet 650 mg (has no administration in time range)    Or  acetaminophen  (TYLENOL ) suppository 650 mg (has no administration in time range)  ondansetron  (ZOFRAN ) tablet 4 mg (has no administration in time range)    Or  ondansetron  (ZOFRAN ) injection 4 mg (has no administration in time range)  cefTRIAXone  (ROCEPHIN ) 1 g in sodium chloride  0.9 % 100 mL IVPB (0 g Intravenous Stopped 12/14/24 0853)  insulin  aspart (novoLOG ) injection 0-15 Units ( Subcutaneous Not Given 12/14/24 1647)  mupirocin  ointment (BACTROBAN ) 2 % 1 Application (1 Application Nasal Given 12/14/24 0820)  lactated ringers  bolus 500 mL (0 mLs Intravenous Stopped 12/13/24 2006)  lactated ringers  bolus 500 mL (0 mLs Intravenous Stopped 12/13/24 2054)  dextrose  50 % solution 50 mL (0 mLs Intravenous Hold 12/14/24 1652)  potassium chloride  10 mEq in 100 mL IVPB (0 mEq Intravenous Stopped 12/13/24 2136)  sodium chloride  0.9 % bolus 1,000 mL (0 mLs Intravenous Stopped 12/13/24  2136)  cefTRIAXone  (ROCEPHIN ) 2 g in sodium chloride  0.9 % 100 mL IVPB (0 g Intravenous Stopped 12/13/24 2114)  lactated ringers  bolus 500 mL (0 mLs Intravenous Stopped 12/14/24 0707)  potassium PHOSPHATE  30 mmol in dextrose  5 % 500 mL infusion (0 mmol Intravenous Stopped 12/14/24 1503)  potassium chloride  10 mEq in 100 mL IVPB (0 mEq Intravenous Stopped 12/14/24 1223)                                    Medical Decision Making This patient presents to the ED for concern of decreased activity, low potassium, decreased p.o. intake, this involves an extensive number of treatment  options, and is a complaint that carries with it a high risk of complications and morbidity.  The differential diagnosis includes UTI, sepsis, dehydration, electrolyte disturbance, hypoglycemia, other   Co morbidities that complicate the patient evaluation :   Left BKA, PAD   Additional history obtained:  Additional history obtained from EMR External records from outside source obtained and reviewed including prior notes    Lab Tests:  I Ordered, and personally interpreted labs.  The pertinent results include: Potassium is 2.8, creatinine is slightly increased from baseline at 1.02, UA shows large leukocytes, greater than 50 red blood cells, greater than 50 white blood cells and many bacteria   Imaging Studies ordered:  I ordered imaging studies including chest x-ray which shows pulmonary edema or infiltrate I independently visualized and interpreted imaging within scope of identifying emergent findings  I agree with the radiologist interpretation   Cardiac Monitoring: / EKG:  The patient was maintained on a cardiac monitor.  I personally viewed and interpreted the cardiac monitored which showed an underlying rhythm of: Rhythm   Consultations Obtained:  I requested consultation with the hospitalist Dr. Manfred,  and discussed lab and imaging findings as well as pertinent plan - they recommend: admission   Problem List / ED Course / Critical interventions / Medication management  Hypotension, UTI, hypokalemia and hypoglycemia-patient has been declining over the past couple weeks worse in the past couple days per her son, brought in for low potassium and decreased activity from nursing home today.  Son states patient is a DNR, would not want intubation or CPR but does want other treatments such as fluids, antibiotics and other medications.  Patient was seen initially in the hallway bed, noted that she was hypotensive, discussed with charge nurse patient was moved to a bed, IV  fluids ordered.  Patient was given initial fluid bolus, labs obtained and she does not have leukocytosis, she is not febrile or tachycardic or tachypneic, does not have any sepsis criteria though was hypotensive, this resolved with fluid bolus.  Patient did have a transient decrease in blood pressure after initial improvement, though paramedic had told me he stopped fluids because she developed a cough and he is for about pulmonary edema, I reexamined her she had no signs of fluid overload, no rales on exam so we restarted fluids and blood pressure improved.  He does have a UTI, was given Rocephin  for this, discussed with hospitalist Dr. Adefeso for admission.  Also hypoglycemic, improved with D50 I ordered medication including D50 for glycemic, IV fluids for dehydration and hypotension, Rocephin  for UTI Reevaluation of the patient after these medicines showed that the patient improved I have reviewed the patients home medicines and have made adjustments as needed  Amount and/or Complexity of Data Reviewed Labs: ordered. Radiology: ordered.  Risk Prescription drug management. Decision regarding hospitalization.        Final diagnoses:  Acute cystitis with hematuria    ED Discharge Orders     None          Suellen Sherran LABOR, PA-C 12/14/24 2201  "

## 2024-12-13 NOTE — H&P (Signed)
 " History and Physical    Patient: [name] FMW:[id number] DOB: 10-21-1931 DOA: 12/13/2024 DOS: the patient was seen and examined on 12/13/2024 PCP: Wilhelmina Cedar  Patient coming from: {Point_of_Origin:[address]}  Chief Complaint:  Chief Complaint  Patient presents with   Weakness   HPI: [name] is a 89 y.o. female with medical history significant of ***  Review of Systems: {ROS_Text:26778} Past Medical History:  Diagnosis Date   Arthritis    qwhere   AV block     2:1  January 14, 2011 / pacemaker plan when cellulitis resolved in length   Bradycardia    2:1  AV block   Cellulitis    Superficial cellulitis right lower leg   January 14, 2011   Chronic diastolic CHF (congestive heart failure) (HCC)    Diabetes mellitus without complication (HCC)    Edema    . EF 65-70%, echo, November 29, 2010   Ejection fraction    EF 65%, echo, January, 2012   Gout    Hypertension    Hypokalemia    October, 2012   Murmur    No significant valvular disease by echo, January, 2012   Pacemaker    January, 2013 Med[employer]onic Sensa   Pulmonary hypertension (HCC)     46 mmHg... echo... January, 2012   Past Surgical History:  Procedure Laterality Date   ABDOMINAL AORTOGRAM W/LOWER EXTREMITY Left 12/23/2022   Procedure: ABDOMINAL AORTOGRAM W/LOWER EXTREMITY;  Surgeon: Magda Debby SAILOR, MD;  Location: [facility] INVASIVE CV LAB;  Service: Cardiovascular;  Laterality: Left;   AMPUTATION Left 12/26/2022   Procedure: AMPUTATION ABOVE KNEE;  Surgeon: Magda Debby SAILOR, MD;  Location: [facility] OR;  Service: Vascular;  Laterality: Left;   APPLICATION OF WOUND VAC Left 12/26/2022   Procedure: APPLICATION OF WOUND VAC;  Surgeon: Magda Debby SAILOR, MD;  Location: [facility] OR;  Service: Vascular;  Laterality: Left;   CHOLECYSTECTOMY     HIP ARTHROPLASTY Right    INSERT / REPLACE / REMOVE PACEMAKER  11/22/2011   PARTIAL HYSTERECTOMY     vaginal   PERMANENT PACEMAKER INSERTION N/A 12/09/2011   Procedure: PERMANENT  PACEMAKER INSERTION;  Surgeon: Danelle LELON Birmingham, MD;  Location: [facility] CATH LAB;  Service: Cardiovascular;  Laterality: N/A;   PPM GENERATOR CHANGEOUT N/A 01/03/2022   Procedure: PPM GENERATOR CHANGEOUT;  Surgeon: Birmingham Danelle LELON, MD;  Location: [facility] INVASIVE CV LAB;  Service: Cardiovascular;  Laterality: N/A;   Social History:  reports that she has never smoked. She has never used smokeless tobacco. She reports that she does not drink alcohol and does not use drugs.  Allergies[1]  Family History  Problem Relation Age of Onset   Cardiomyopathy Father    Diabetes Father    Heart failure Father    Dementia Mother    Cancer Son    Coronary artery disease Son    Heart attack Neg Hx     Prior to Admission medications  Medication Sig Start Date End Date Taking? Authorizing Provider  albuterol (VENTOLIN HFA) 108 (90 Base) MCG/ACT inhaler Inhale 2 puffs into the lungs every 4 (four) hours as needed for shortness of breath.    [provider]  allopurinol  (ZYLOPRIM ) 100 MG tablet Take 100 mg by mouth daily.    [provider]  ammonium lactate (AMLACTIN) 12 % cream Apply 1 Application topically in the morning and at bedtime.    [provider]  Biotin  5000 MCG CAPS Take 5,000 mcg by mouth daily. (0900)  [provider]  carvedilol  (COREG ) 6.25 MG tablet Take 6.25 mg by mouth 2 (two) times daily with a meal. Hold for HR <60 (0900 & 1700)    [provider]  cetirizine (ZYRTEC) 10 MG tablet Take 10 mg by mouth daily.    [provider]  Cholecalciferol  (VITAMIN D3) 50 MCG (2000 UT) TABS Take 50 mcg by mouth daily. 0900    [provider]  colchicine  0.6 MG tablet Take 0.6 mg by mouth 2 (two) times daily.    [provider]  Cranberry 300 MG tablet Take 300 mg by mouth daily.    [provider]  cyclobenzaprine  (FLEXERIL ) 5 MG tablet Take 1 tablet (5 mg total) by mouth 3 (three) times daily as needed for muscle  spasms. Patient taking differently: Take 5 mg by mouth every 8 (eight) hours as needed for muscle spasms. 10/13/23   Raford Lenis, MD  Emollient (EUCERIN) lotion Apply 1 Application topically every 12 (twelve) hours as needed for dry skin. Apply to Rt foot/ankle/leg    [provider]  estradiol  (ESTRACE ) 0.1 MG/GM vaginal cream Discard plastic applicator. Insert a blueberry size amount (approximately 1 gram) of cream on gloved fingertip inside vagina at bedtime every night. For long term use. Patient taking differently: Place 1 g vaginally every Monday, Wednesday, and Friday. Discard plastic applicator. Insert a blueberry size amount (approximately 1 gram) of cream on gloved fingertip inside vagina at bedtime every night. For long term use. 09/05/23   Gerldine Lauraine BROCKS, FNP  ferrous sulfate 325 (65 FE) MG EC tablet Take 325 mg by mouth daily with breakfast.    [provider]  furosemide  (LASIX ) 20 MG tablet Take 10 mg by mouth daily.    [provider]  Magnesium  250 MG TABS Take 1 tablet by mouth daily.    [provider]  melatonin 3 MG TABS tablet Take 3 mg by mouth at bedtime. (2100)    [provider]  memantine (NAMENDA) 5 MG tablet Take 5 mg by mouth daily.    [provider]  Menthol, Topical Analgesic, (BIOFREEZE COLORLESS EX) Apply 1 application  topically in the morning, at noon, and at bedtime. for neck pain    [provider]  nitroGLYCERIN  (NITROSTAT ) 0.4 MG SL tablet Place 0.4 mg under the tongue every 5 (five) minutes as needed for chest pain (X 3 DOSES FOR CHEST PAIN).    [provider]  oxyCODONE  (OXY IR/ROXICODONE ) 5 MG immediate release tablet Take 1 tablet (5 mg total) by mouth every 4 (four) hours as needed for moderate pain or severe pain (1 tablet for pain 4-6, 2 tablet for pain 7-10). Patient taking differently: Take 5 mg by mouth every 8 (eight) hours as needed for severe pain (pain score 7-10). 12/31/22    Ghimire, Kuber, MD  OXYGEN Inhale 2 L/min into the lungs continuous. Keep O2 saturation greater than 90%    [provider]  pantoprazole  (PROTONIX ) 40 MG tablet Take 40 mg by mouth daily. 0830    [provider]  polyethylene glycol powder (GLYCOLAX /MIRALAX ) powder Take 17 g by mouth daily. (0900)    [provider]  potassium chloride  (KLOR-CON ) 10 MEQ tablet Take 10 mEq by mouth daily.    [provider]  pregabalin (LYRICA) 25 MG capsule Take 25 mg by mouth 2 (two) times daily.    [provider]  rosuvastatin (CRESTOR) 40 MG tablet Take 40 mg by mouth daily.  [provider]  sitaGLIPtin (JANUVIA) 50 MG tablet Take 50 mg by mouth in the morning. (0900)    [provider]  sucralfate (CARAFATE) 1 g tablet Take 1 g by mouth with breakfast, with lunch, and with evening meal.    [provider]  Vitamins A & D (VITAMIN A & D) ointment Apply 1 Application topically See admin instructions. Apply to buttocks/gluteal folds/Rt rear thigh for prevention of moisture associated skin damage. Apply after incontinence care every shift. MAR has 3 daily applications for this med as well as a PRN order every 12 hours as needed.    [provider]    Physical Exam: Vitals:   12/13/24 1948 12/13/24 2030 12/13/24 2100 12/13/24 2140  BP:  (!) 104/46 (!) 82/38 (!) 89/66  Pulse:  60 60 60  Resp:  17 18 20   Temp:      TempSrc:      SpO2: (S) 100% 100% 100% 100%   *** Data Reviewed: {Tip this will not be part of the note when signed- Document your independent interpretation of telemetry tracing, EKG, lab, Radiology test or any other diagnostic tests. Add any new diagnostic test ordered today. (Optional):26781} {Results:26384}  Assessment and Plan: No notes have been filed under this hospital service. Service: Hospitalist     Advance Care Planning:   Code Status: Prior ***  Consults: ***  Family Communication:  ***  Severity of Illness: {Observation/Inpatient:21159}  Author: Posey Maier, DO 12/13/2024 10:13 PM  For on call review [email or web address].     [1]  Allergies Allergen Reactions   Aspirin  Rash and Other (See Comments)    GI distress   Tramadol  Other (See Comments)    GI distress   Naproxen Other (See Comments)    Unknown reaction   Shellfish Protein-Containing Drug Products Itching and Rash   "

## 2024-12-13 NOTE — ED Notes (Signed)
 Pt blood sugar was obtained with a value 32. Pt was given an amp of d50. Pt became a little more alert. Pt blood pressure has been in the lower 80 systolic to upper 70's. Pt son is in the room and stated that pt has been declining over the last few months. Pt came in today due to being weak and failure to thrive.

## 2024-12-13 NOTE — ED Notes (Signed)
 Pt is more aware at this time. Pt has developed a productive cough. Pt had a large amount of mucus in her mouth that we assisted in spitting out into a napkin. Pt lung sounds were clear and she is moving good air. Pt fluids were slowed until provider can speak with the advanced provider.

## 2024-12-14 DIAGNOSIS — R6521 Severe sepsis with septic shock: Secondary | ICD-10-CM | POA: Diagnosis not present

## 2024-12-14 DIAGNOSIS — A419 Sepsis, unspecified organism: Secondary | ICD-10-CM | POA: Diagnosis not present

## 2024-12-14 LAB — COMPREHENSIVE METABOLIC PANEL WITH GFR
ALT: 31 U/L (ref 0–44)
AST: 62 U/L — ABNORMAL HIGH (ref 15–41)
Albumin: 2.3 g/dL — ABNORMAL LOW (ref 3.5–5.0)
Alkaline Phosphatase: 167 U/L — ABNORMAL HIGH (ref 38–126)
Anion gap: 7 (ref 5–15)
BUN: 14 mg/dL (ref 8–23)
CO2: 26 mmol/L (ref 22–32)
Calcium: 8 mg/dL — ABNORMAL LOW (ref 8.9–10.3)
Chloride: 103 mmol/L (ref 98–111)
Creatinine, Ser: 0.95 mg/dL (ref 0.44–1.00)
GFR, Estimated: 57 mL/min — ABNORMAL LOW
Glucose, Bld: 100 mg/dL — ABNORMAL HIGH (ref 70–99)
Potassium: 2.6 mmol/L — CL (ref 3.5–5.1)
Sodium: 137 mmol/L (ref 135–145)
Total Bilirubin: 0.3 mg/dL (ref 0.0–1.2)
Total Protein: 5.9 g/dL — ABNORMAL LOW (ref 6.5–8.1)

## 2024-12-14 LAB — GLUCOSE, CAPILLARY
Glucose-Capillary: 120 mg/dL — ABNORMAL HIGH (ref 70–99)
Glucose-Capillary: 15 mg/dL — CL (ref 70–99)
Glucose-Capillary: 142 mg/dL — ABNORMAL HIGH (ref 70–99)
Glucose-Capillary: 116 mg/dL — ABNORMAL HIGH (ref 70–99)
Glucose-Capillary: 19 mg/dL — CL (ref 70–99)
Glucose-Capillary: 63 mg/dL — ABNORMAL LOW (ref 70–99)
Glucose-Capillary: 96 mg/dL (ref 70–99)

## 2024-12-14 LAB — PHOSPHORUS: Phosphorus: 1.8 mg/dL — ABNORMAL LOW (ref 2.5–4.6)

## 2024-12-14 LAB — LACTIC ACID, PLASMA
Lactic Acid, Venous: 2.6 mmol/L (ref 0.5–1.9)
Lactic Acid, Venous: 1.6 mmol/L (ref 0.5–1.9)

## 2024-12-14 LAB — CBC
HCT: 30.6 % — ABNORMAL LOW (ref 36.0–46.0)
Hemoglobin: 9.9 g/dL — ABNORMAL LOW (ref 12.0–15.0)
MCH: 29.1 pg (ref 26.0–34.0)
MCHC: 32.4 g/dL (ref 30.0–36.0)
MCV: 90 fL (ref 80.0–100.0)
Platelets: 144 10*3/uL — ABNORMAL LOW (ref 150–400)
RBC: 3.4 MIL/uL — ABNORMAL LOW (ref 3.87–5.11)
RDW: 19.1 % — ABNORMAL HIGH (ref 11.5–15.5)
WBC: 8.7 10*3/uL (ref 4.0–10.5)
nRBC: 0.2 % (ref 0.0–0.2)

## 2024-12-14 LAB — MAGNESIUM: Magnesium: 1.9 mg/dL (ref 1.7–2.4)

## 2024-12-14 LAB — MRSA NEXT GEN BY PCR, NASAL: MRSA by PCR Next Gen: DETECTED — AB

## 2024-12-14 MED ORDER — POTASSIUM PHOSPHATES 15 MMOLE/5ML IV SOLN
30.0000 mmol | Freq: Once | INTRAVENOUS | Status: AC
  Administered 2024-12-14: 30 mmol via INTRAVENOUS
  Filled 2024-12-14: qty 10

## 2024-12-14 MED ORDER — DEXTROSE 50 % IV SOLN
INTRAVENOUS | Status: AC
  Administered 2024-12-14: 50 mL via INTRAVENOUS
  Filled 2024-12-14: qty 50

## 2024-12-14 MED ORDER — POTASSIUM CHLORIDE 10 MEQ/100ML IV SOLN
10.0000 meq | INTRAVENOUS | Status: AC
  Administered 2024-12-14 (×5): 10 meq via INTRAVENOUS
  Filled 2024-12-14 (×5): qty 100

## 2024-12-14 MED ORDER — INSULIN ASPART 100 UNIT/ML IJ SOLN
0.0000 [IU] | Freq: Three times a day (TID) | INTRAMUSCULAR | Status: DC
  Administered 2024-12-14: 2 [IU] via SUBCUTANEOUS
  Filled 2024-12-14: qty 1

## 2024-12-14 MED ORDER — MUPIROCIN 2 % EX OINT
1.0000 | TOPICAL_OINTMENT | Freq: Two times a day (BID) | CUTANEOUS | Status: DC
  Administered 2024-12-14 – 2024-12-17 (×7): 1 via NASAL
  Filled 2024-12-14 (×3): qty 22

## 2024-12-14 MED ORDER — ONDANSETRON HCL 4 MG/2ML IJ SOLN: 4.0000 mg | Freq: Four times a day (QID) | INTRAMUSCULAR | Status: DC | PRN

## 2024-12-14 MED ORDER — POTASSIUM CHLORIDE CRYS ER 20 MEQ PO TBCR
40.0000 meq | EXTENDED_RELEASE_TABLET | Freq: Once | ORAL | Status: DC
  Filled 2024-12-14: qty 2

## 2024-12-14 MED ORDER — DICLOFENAC SODIUM 1 % EX GEL
2.0000 g | Freq: Four times a day (QID) | CUTANEOUS | Status: DC | PRN
  Administered 2024-12-14 – 2024-12-15 (×2): 2 g via TOPICAL
  Filled 2024-12-14: qty 100

## 2024-12-14 MED ORDER — HEPARIN SODIUM (PORCINE) 5000 UNIT/ML IJ SOLN
5000.0000 [IU] | Freq: Three times a day (TID) | INTRAMUSCULAR | Status: DC
  Administered 2024-12-14 – 2024-12-17 (×10): 5000 [IU] via SUBCUTANEOUS
  Filled 2024-12-14 (×10): qty 1

## 2024-12-14 MED ORDER — DEXTROSE 50 % IV SOLN
INTRAVENOUS | Status: AC
  Filled 2024-12-14: qty 50

## 2024-12-14 MED ORDER — SODIUM CHLORIDE 0.9 % IV SOLN
1.0000 g | INTRAVENOUS | Status: DC
  Administered 2024-12-14 – 2024-12-16 (×3): 1 g via INTRAVENOUS
  Filled 2024-12-14 (×3): qty 10

## 2024-12-14 MED ORDER — ONDANSETRON HCL 4 MG PO TABS: 4.0000 mg | ORAL_TABLET | Freq: Four times a day (QID) | ORAL | Status: DC | PRN

## 2024-12-14 MED ORDER — POTASSIUM CHLORIDE 10 MEQ/100ML IV SOLN
10.0000 meq | INTRAVENOUS | Status: DC
  Administered 2024-12-14: 10 meq via INTRAVENOUS
  Filled 2024-12-14: qty 100

## 2024-12-14 MED ORDER — ACETAMINOPHEN 325 MG PO TABS
650.0000 mg | ORAL_TABLET | Freq: Four times a day (QID) | ORAL | Status: DC | PRN
  Administered 2024-12-15 – 2024-12-16 (×4): 650 mg via ORAL
  Filled 2024-12-14 (×4): qty 2

## 2024-12-14 MED ORDER — ACETAMINOPHEN 650 MG RE SUPP: 650.0000 mg | Freq: Four times a day (QID) | RECTAL | Status: DC | PRN

## 2024-12-14 NOTE — Plan of Care (Signed)

## 2024-12-14 NOTE — Progress Notes (Signed)
 eLink Physician-Brief Progress Note Patient Name: [name] DOB: February 22, 1931 MRN: [id number]   Date of Service  12/14/2024  HPI/Events of Note  89 y.o. female with hypertension, T2DM, chronic diastolic congestive heart failure, complete heart block s/p PPM, pulmonary hypertension who presented to the emergency department due to generalized weakness.  Patient has had poor oral intake the last couple of weeks and this has worsened in the last few days.  In ED patient hypotensive with multiple electrolyte abnormalities including potassium of 2.2. Potassium was replaced, IVF's, pressors, and empiric abx.  eICU Interventions  Chart reviewed     Intervention Category Evaluation Type: New Patient Evaluation  CLAUDENE AGENT, P 12/14/2024, 6:24 AM

## 2024-12-14 NOTE — Progress Notes (Addendum)
 Patient's blood sugar was 15 @ 0745. 1 amp of D50 given. Patient able to arouse but unable to safely take anything by mouth. Blood sugar recheck @ 0805 was 120.

## 2024-12-14 NOTE — Progress Notes (Addendum)
 "    Progress Note    [name]  FMW:[id number] DOB: Nov 13, 1931  DOA: 12/13/2024 PCP: Wilhelmina Cedar      Brief Narrative:    Medical records reviewed and are as summarized below:  [name] is a 89 y.o. female with medical history significant for hypertension, type II DM, chronic diastolic CHF, history of complete heart block s/p permanent pacemaker, pulmonary hypertension, who presented to the emergency department because of general weakness, burning urination and low potassium.  Reportedly, she has had poor oral intake in the past few weeks preceding admission and has not been eating nor drinking well.     Initial vital signs in the ED: Temperature 98.1 F, respiratory 16, pulse 92, BP 83/53, oxygen saturation 100% on 2 L.  BP dropped to 77/38, respiratory rate went up to 30 and oxygen saturation dropped to 84%.  At one point, she was requiring 15 L oxygen via nonrebreather mask.      Assessment/Plan:   Principal Problem:   Septic shock (HCC) Active Problems:   Hypokalemia   Chronic diastolic CHF (congestive heart failure) (HCC)   Type 2 diabetes mellitus with peripheral neuropathy (HCC)   UTI (urinary tract infection)   Hypophosphatemia    There is no height or weight on file to calculate BMI.   Septic shock secondary to acute UTI: Continue IV ceftriaxone .  Continue IV Levophed  infusion and taper off as able.  Follow-up urine and blood cultures. Lactic acid down from 3.7-1.6.   Acute on chronic hypoxic respiratory failure: Continue 4 L oxygen via [place].   Hypokalemia: Potassium down from 2.8-2.6.  Replete potassium intravenously and orally.   Hypophosphatemia: Phosphorus 1.8.  Replete with IV potassium phosphate  and monitor levels.   Hypoglycemia: Resolved.  Monitor glucose closely.  50% dextrose  IV or glucagon injection as needed for hypoglycemia.   Elevated liver enzymes: May be related to sepsis.  Continue to monitor.   Prolonged QTc interval  at 539: Avoid QT prolonging drugs as able.  Repeat EKG open electrolytes are replete.   Comorbidities include chronic diastolic CHF, type II DM, hypertension, permanent pacemaker for history of complete heart block   Patient is DNR.  Tod, son, confirmed that patient is DNR  CRITICAL CARE Performed by: AIDA CHO   Total critical care time: 35 minutes  Critical care time was exclusive of separately billable procedures and treating other patients.  Critical care was necessary to treat or prevent imminent or life-threatening deterioration.  Critical care was time spent personally by me on the following activities: development of treatment plan with patient and/or surrogate as well as nursing, discussions with consultants, evaluation of patient's response to treatment, examination of patient, obtaining history from patient or surrogate, ordering and performing treatments and interventions, ordering and review of laboratory studies, ordering and review of radiographic studies, pulse oximetry and re-evaluation of patient's condition.    Diet Order             Diet Heart Room service appropriate? Yes; Fluid consistency: Thin  Diet effective now                                  Consultants: None  Procedures: None    Medications:    Chlorhexidine  Gluconate Cloth  6 each Topical Daily   heparin   5,000 Units Subcutaneous Q8H   insulin  aspart  0-15 Units Subcutaneous TID WC   mupirocin  ointment  1 Application Nasal BID   potassium chloride  SA  40 mEq Oral Once   Continuous Infusions:  sodium chloride      cefTRIAXone  (ROCEPHIN )  IV Stopped (12/14/24 9146)   norepinephrine      norepinephrine  (LEVOPHED ) Adult infusion 6 mcg/min (12/14/24 1131)   potassium chloride  10 mEq (12/14/24 1109)   potassium PHOSPHATE  IVPB (in mmol) 85 mL/hr at 12/14/24 1039     Anti-infectives (From admission, onward)    Start     Dose/Rate Route Frequency Ordered Stop    12/14/24 1000  cefTRIAXone  (ROCEPHIN ) 1 g in sodium chloride  0.9 % 100 mL IVPB        1 g 200 mL/hr over 30 Minutes Intravenous Every 24 hours 12/14/24 0454     12/13/24 2015  cefTRIAXone  (ROCEPHIN ) 2 g in sodium chloride  0.9 % 100 mL IVPB        2 g 200 mL/hr over 30 Minutes Intravenous  Once 12/13/24 2009 12/13/24 2114              Family Communication/Anticipated D/C date and plan/Code Status   DVT prophylaxis: heparin  injection 5,000 Units Start: 12/14/24 0600 SCDs Start: 12/14/24 0257     Code Status: Limited: Do not attempt resuscitation (DNR) -DNR-LIMITED -Do Not Intubate/DNI   Family Communication: Plan discussed with Tod, son, at the bedside Disposition Plan: Plan to discharge to SNF   Status is: Inpatient Remains inpatient appropriate because: Septic shock from UTI       Subjective:   Interval events noted.  She is confused and cannot provide any history.  Tod, son, was at the bedside  Objective:    Vitals:   12/14/24 0814 12/14/24 1000 12/14/24 1030 12/14/24 1124  BP:  126/60 (!) 116/38   Pulse:  60 (!) 59   Resp:  20 16   Temp: 97.7 F (36.5 C)   (!) 97.4 F (36.3 C)  TempSrc: Axillary   Axillary  SpO2:  100% 98%    No data found.   Intake/Output Summary (Last 24 hours) at 12/14/2024 1158 Last data filed at 12/14/2024 1039 Gross per 24 hour  Intake 2279.12 ml  Output --  Net 2279.12 ml   There were no vitals filed for this visit.  Exam:  GEN: NAD SKIN: Warm and dry EYES: No pallor or icterus ENT: MMM CV: RRR PULM: CTA B ABD: soft, ND, NT, +BS CNS: AAO x 1 (person), non focal EXT: No edema or tenderness.  Left AKA        Data Reviewed:   I have personally reviewed following labs and imaging studies:  Labs: Labs show the following:   Basic Metabolic Panel: Recent Labs  Lab 12/13/24 1838 12/14/24 0329  NA 135 137  K 2.8* 2.6*  CL 96* 103  CO2 25 26  GLUCOSE 120* 100*  BUN 17 14  CREATININE 1.02* 0.95   CALCIUM  8.7* 8.0*  MG 2.0 1.9  PHOS  --  1.8*   GFR CrCl cannot be calculated (Unknown ideal weight.). Liver Function Tests: Recent Labs  Lab 12/13/24 1838 12/14/24 0329  AST 73* 62*  ALT 35 31  ALKPHOS 191* 167*  BILITOT 0.4 0.3  PROT 6.8 5.9*  ALBUMIN  2.6* 2.3*   No results for input(s): LIPASE, AMYLASE in the last 168 hours. No results for input(s): AMMONIA in the last 168 hours. Coagulation profile No results for input(s): INR, PROTIME in the last 168 hours.  CBC: Recent Labs  Lab 12/13/24 1838 12/14/24 0329  WBC  7.2 8.7  NEUTROABS 5.2  --   HGB 11.3* 9.9*  HCT 34.8* 30.6*  MCV 89.0 90.0  PLT 156 144*   Cardiac Enzymes: No results for input(s): CKTOTAL, CKMB, CKMBINDEX, TROPONINI in the last 168 hours. BNP (last 3 results) No results for input(s): PROBNP in the last 8760 hours. CBG: Recent Labs  Lab 12/13/24 1924 12/13/24 2012 12/14/24 0739 12/14/24 0809 12/14/24 1115  GLUCAP 32* 279* 15* 120* 142*   D-Dimer: No results for input(s): DDIMER in the last 72 hours. Hgb A1c: No results for input(s): HGBA1C in the last 72 hours. Lipid Profile: No results for input(s): CHOL, HDL, LDLCALC, TRIG, CHOLHDL, LDLDIRECT in the last 72 hours. Thyroid  function studies: No results for input(s): TSH, T4TOTAL, T3FREE, THYROIDAB in the last 72 hours.  Invalid input(s): FREET3 Anemia work up: No results for input(s): VITAMINB12, FOLATE, FERRITIN, TIBC, IRON, RETICCTPCT in the last 72 hours. Sepsis Labs: Recent Labs  Lab 12/13/24 1838 12/13/24 2043 12/13/24 2253 12/14/24 0329 12/14/24 0651  WBC 7.2  --   --  8.7  --   LATICACIDVEN  --  3.2* 3.7* 2.6* 1.6    Microbiology Recent Results (from the past 240 hours)  Blood culture (routine x 2)     Status: None (Preliminary result)   Collection Time: 12/13/24  7:10 PM   Specimen: BLOOD LEFT HAND  Result Value Ref Range Status   Specimen Description  BLOOD LEFT HAND BLOOD  Final   Special Requests   Final    AEROBIC BOTTLE ONLY Blood Culture results may not be optimal due to an inadequate volume of blood received in culture bottles   Culture   Final    NO GROWTH < 12 HOURS Performed at [facility], [address], [place], [place] [address]    Report Status PENDING  Incomplete  Blood culture (routine x 2)     Status: None (Preliminary result)   Collection Time: 12/13/24  7:30 PM   Specimen: BLOOD  Result Value Ref Range Status   Specimen Description BLOOD RIGHT ANTECUBITAL  Final   Special Requests   Final    BOTTLES DRAWN AEROBIC AND ANAEROBIC Blood Culture adequate volume   Culture   Final    NO GROWTH < 12 HOURS Performed at [facility], [address], [place], [place] [address]    Report Status PENDING  Incomplete  MRSA Next Gen by PCR, Nasal     Status: Abnormal   Collection Time: 12/13/24 11:18 PM   Specimen: Nasal Mucosa; Nasal Swab  Result Value Ref Range Status   MRSA by PCR Next Gen DETECTED (A) NOT DETECTED Final    Comment: RESULT CALLED TO, READ BACK BY AND VERIFIED WITH: CODY,RN ON 12/17/23 AT 0616 BY PURDIE,J (NOTE) The GeneXpert MRSA Assay (FDA approved for NASAL specimens only), is one component of a comprehensive MRSA colonization surveillance program. It is not intended to diagnose MRSA infection nor to guide or monitor treatment for MRSA infections. Test performance is not FDA approved in patients less than 31 years old. Performed at [facility], [address] [place], [place] [address]     Procedures and diagnostic studies:  DG Chest Portable 1 View Result Date: 12/13/2024 EXAM: 1 VIEW(S) XRAY OF THE CHEST 12/13/2024 05:35:02 PM COMPARISON: 08/15/2024 CLINICAL HISTORY: Fatigue, weakness, and low potassium. FINDINGS: LINES, TUBES AND DEVICES: Left chest pacemaker with leads overlying right atrium and right ventricle. LUNGS AND PLEURA: Low lung volumes. No focal pulmonary opacity. No pleural effusion.  No pneumothorax. HEART AND MEDIASTINUM: No acute abnormality of the cardiac and mediastinal silhouettes. BONES AND SOFT TISSUES: Right upper quadrant surgical clips. No acute osseous abnormality. IMPRESSION: 1. No acute findings. Electronically signed by: Oneil Devonshire MD 12/13/2024 05:42 PM EST RP Workstation: [id number]               LOS: 1 day   Britian Jentz  [facility]   Pager on [email or web address]. If 7PM-7AM, please contact night-coverage at [email or web address]     12/14/2024, 11:58 AM           "

## 2024-12-15 DIAGNOSIS — A419 Sepsis, unspecified organism: Secondary | ICD-10-CM | POA: Diagnosis not present

## 2024-12-15 DIAGNOSIS — R6521 Severe sepsis with septic shock: Secondary | ICD-10-CM | POA: Diagnosis not present

## 2024-12-15 LAB — CBC WITH DIFFERENTIAL/PLATELET
Abs Immature Granulocytes: 0.07 10*3/uL (ref 0.00–0.07)
Basophils Absolute: 0 10*3/uL (ref 0.0–0.1)
Basophils Relative: 0 %
Eosinophils Absolute: 0.1 10*3/uL (ref 0.0–0.5)
Eosinophils Relative: 1 %
HCT: 30.2 % — ABNORMAL LOW (ref 36.0–46.0)
Hemoglobin: 9.8 g/dL — ABNORMAL LOW (ref 12.0–15.0)
Immature Granulocytes: 1 %
Lymphocytes Relative: 15 %
Lymphs Abs: 1.3 10*3/uL (ref 0.7–4.0)
MCH: 29.1 pg (ref 26.0–34.0)
MCHC: 32.5 g/dL (ref 30.0–36.0)
MCV: 89.6 fL (ref 80.0–100.0)
Monocytes Absolute: 0.9 10*3/uL (ref 0.1–1.0)
Monocytes Relative: 10 %
Neutro Abs: 6.3 10*3/uL (ref 1.7–7.7)
Neutrophils Relative %: 73 %
Platelets: 139 10*3/uL — ABNORMAL LOW (ref 150–400)
RBC: 3.37 MIL/uL — ABNORMAL LOW (ref 3.87–5.11)
RDW: 19.2 % — ABNORMAL HIGH (ref 11.5–15.5)
WBC: 8.6 10*3/uL (ref 4.0–10.5)
nRBC: 0 % (ref 0.0–0.2)

## 2024-12-15 LAB — COMPREHENSIVE METABOLIC PANEL WITH GFR
ALT: 36 U/L (ref 0–44)
AST: 64 U/L — ABNORMAL HIGH (ref 15–41)
Albumin: 2.2 g/dL — ABNORMAL LOW (ref 3.5–5.0)
Alkaline Phosphatase: 171 U/L — ABNORMAL HIGH (ref 38–126)
Anion gap: 10 (ref 5–15)
BUN: 12 mg/dL (ref 8–23)
CO2: 25 mmol/L (ref 22–32)
Calcium: 8 mg/dL — ABNORMAL LOW (ref 8.9–10.3)
Chloride: 104 mmol/L (ref 98–111)
Creatinine, Ser: 0.78 mg/dL (ref 0.44–1.00)
GFR, Estimated: >60 mL/min
Glucose, Bld: 75 mg/dL (ref 70–99)
Potassium: 3.9 mmol/L (ref 3.5–5.1)
Sodium: 139 mmol/L (ref 135–145)
Total Bilirubin: 0.3 mg/dL (ref 0.0–1.2)
Total Protein: 5.7 g/dL — ABNORMAL LOW (ref 6.5–8.1)

## 2024-12-15 LAB — URINE CULTURE

## 2024-12-15 LAB — GLUCOSE, CAPILLARY
Glucose-Capillary: 60 mg/dL — ABNORMAL LOW (ref 70–99)
Glucose-Capillary: 90 mg/dL (ref 70–99)
Glucose-Capillary: 88 mg/dL (ref 70–99)
Glucose-Capillary: 86 mg/dL (ref 70–99)

## 2024-12-15 LAB — HEMOGLOBIN A1C
Hgb A1c MFr Bld: 5.1 % (ref 4.8–5.6)
Mean Plasma Glucose: 100 mg/dL

## 2024-12-15 LAB — MAGNESIUM: Magnesium: 1.8 mg/dL (ref 1.7–2.4)

## 2024-12-15 LAB — PHOSPHORUS: Phosphorus: 3.6 mg/dL (ref 2.5–4.6)

## 2024-12-15 MED ORDER — OXYCODONE HCL 5 MG PO TABS
5.0000 mg | ORAL_TABLET | Freq: Once | ORAL | Status: AC
  Administered 2024-12-15: 5 mg via ORAL
  Filled 2024-12-15: qty 1

## 2024-12-15 MED ORDER — OXYCODONE HCL 5 MG PO TABS
5.0000 mg | ORAL_TABLET | Freq: Once | ORAL | Status: AC
  Administered 2024-12-16: 5 mg via ORAL
  Filled 2024-12-15: qty 1

## 2024-12-15 NOTE — Progress Notes (Addendum)
 "    Progress Note    [name]  FMW:[id number] DOB: 1931-08-26  DOA: 12/13/2024 PCP: Wilhelmina Cedar      Brief Narrative:    Medical records reviewed and are as summarized below:  [name] is a 89 y.o. female with medical history significant for hypertension, type II DM, chronic diastolic CHF, history of complete heart block s/p permanent pacemaker, pulmonary hypertension, who presented to the emergency department because of general weakness, burning urination and low potassium.  Reportedly, she has had poor oral intake in the past few weeks preceding admission and has not been eating nor drinking well.     Initial vital signs in the ED: Temperature 98.1 F, respiratory 16, pulse 92, BP 83/53, oxygen saturation 100% on 2 L.  BP dropped to 77/38, respiratory rate went up to 30 and oxygen saturation dropped to 84%.  At one point, she was requiring 15 L oxygen via nonrebreather mask.      Assessment/Plan:   Principal Problem:   Septic shock (HCC) Active Problems:   Hypokalemia   Chronic diastolic CHF (congestive heart failure) (HCC)   Type 2 diabetes mellitus with peripheral neuropathy (HCC)   UTI (urinary tract infection)   Hypophosphatemia    There is no height or weight on file to calculate BMI.   Septic shock secondary to acute UTI: No growth on urine and blood cultures.  Continue IV ceftriaxone .  Wean off Levophed  infusion.   Lactic acid down from 3.7-1.6.   Acute on chronic hypoxic respiratory failure: Taper down oxygen from 4 L to 2 L.   Hypokalemia: Improved.   Hypophosphatemia: Improved   Hypoglycemia: Recurrent hypoglycemia.  Nursing has some concerns that develop fingerstick glucose levels may not be accurate.  Continue to monitor glucose levels closely and treat hypoglycemia with IV 50% dextrose  or glucagon injection as needed.   Elevated liver enzymes: May be related to sepsis.  Continue to monitor.   Prolonged QTc interval at 539:  Repeat EKG.   Avoid QT prolonging drugs as able.     Comorbidities include chronic diastolic CHF, type II DM, hypertension, permanent pacemaker for history of complete heart block   Transfer from ICU to telemetry unit     Diet Order             DIET DYS 2 Room service appropriate? Yes; Fluid consistency: Thin  Diet effective now                                  Consultants: None  Procedures: None    Medications:    Chlorhexidine  Gluconate Cloth  6 each Topical Daily   heparin   5,000 Units Subcutaneous Q8H   insulin  aspart  0-15 Units Subcutaneous TID WC   mupirocin  ointment  1 Application Nasal BID   potassium chloride  SA  40 mEq Oral Once   Continuous Infusions:  cefTRIAXone  (ROCEPHIN )  IV Stopped (12/15/24 0902)   norepinephrine  (LEVOPHED ) Adult infusion Stopped (12/15/24 0759)     Anti-infectives (From admission, onward)    Start     Dose/Rate Route Frequency Ordered Stop   12/14/24 1000  cefTRIAXone  (ROCEPHIN ) 1 g in sodium chloride  0.9 % 100 mL IVPB        1 g 200 mL/hr over 30 Minutes Intravenous Every 24 hours 12/14/24 0454     12/13/24 2015  cefTRIAXone  (ROCEPHIN ) 2 g in sodium chloride  0.9 % 100 mL  IVPB        2 g 200 mL/hr over 30 Minutes Intravenous  Once 12/13/24 2009 12/13/24 2114              Family Communication/Anticipated D/C date and plan/Code Status   DVT prophylaxis: heparin  injection 5,000 Units Start: 12/14/24 0600 SCDs Start: 12/14/24 0257     Code Status: Limited: Do not attempt resuscitation (DNR) -DNR-LIMITED -Do Not Intubate/DNI   Family Communication: None  Disposition Plan: Plan to discharge to SNF   Status is: Inpatient Remains inpatient appropriate because: Septic shock from UTI       Subjective:   Interval events noted.  She has no complaints.  Objective:    Vitals:   12/15/24 0715 12/15/24 0730 12/15/24 0745 12/15/24 0815  BP: (!) 121/38 (!) 89/73 104/75 (!) 183/99  Pulse:       Resp: 13 20 20 20   Temp:      TempSrc:      SpO2:    98%   No data found.   Intake/Output Summary (Last 24 hours) at 12/15/2024 1014 Last data filed at 12/15/2024 0917 Gross per 24 hour  Intake 1881.01 ml  Output 650 ml  Net 1231.01 ml   There were no vitals filed for this visit.  Exam:  GEN: NAD SKIN: Warm and dry EYES: EOMI ENT: MMM CV: RRR PULM: CTA B ABD: soft, ND, NT, +BS CNS: AAO x 2 (person and place), non focal EXT: No edema or tenderness.  Left AKA       Data Reviewed:   I have personally reviewed following labs and imaging studies:  Labs: Labs show the following:   Basic Metabolic Panel: Recent Labs  Lab 12/13/24 1838 12/14/24 0329 12/15/24 0312  NA 135 137 139  K 2.8* 2.6* 3.9  CL 96* 103 104  CO2 25 26 25   GLUCOSE 120* 100* 75  BUN 17 14 12   CREATININE 1.02* 0.95 0.78  CALCIUM  8.7* 8.0* 8.0*  MG 2.0 1.9 1.8  PHOS  --  1.8* 3.6   GFR CrCl cannot be calculated (Unknown ideal weight.). Liver Function Tests: Recent Labs  Lab 12/13/24 1838 12/14/24 0329 12/15/24 0312  AST 73* 62* 64*  ALT 35 31 36  ALKPHOS 191* 167* 171*  BILITOT 0.4 0.3 0.3  PROT 6.8 5.9* 5.7*  ALBUMIN  2.6* 2.3* 2.2*   No results for input(s): LIPASE, AMYLASE in the last 168 hours. No results for input(s): AMMONIA in the last 168 hours. Coagulation profile No results for input(s): INR, PROTIME in the last 168 hours.  CBC: Recent Labs  Lab 12/13/24 1838 12/14/24 0329 12/15/24 0312  WBC 7.2 8.7 8.6  NEUTROABS 5.2  --  6.3  HGB 11.3* 9.9* 9.8*  HCT 34.8* 30.6* 30.2*  MCV 89.0 90.0 89.6  PLT 156 144* 139*   Cardiac Enzymes: No results for input(s): CKTOTAL, CKMB, CKMBINDEX, TROPONINI in the last 168 hours. BNP (last 3 results) No results for input(s): PROBNP in the last 8760 hours. CBG: Recent Labs  Lab 12/14/24 1640 12/14/24 1649 12/14/24 2110 12/14/24 2116 12/15/24 0735  GLUCAP 19* 116* 63* 96 60*   D-Dimer: No results  for input(s): DDIMER in the last 72 hours. Hgb A1c: No results for input(s): HGBA1C in the last 72 hours. Lipid Profile: No results for input(s): CHOL, HDL, LDLCALC, TRIG, CHOLHDL, LDLDIRECT in the last 72 hours. Thyroid  function studies: No results for input(s): TSH, T4TOTAL, T3FREE, THYROIDAB in the last 72 hours.  Invalid input(s): FREET3  Anemia work up: No results for input(s): VITAMINB12, FOLATE, FERRITIN, TIBC, IRON, RETICCTPCT in the last 72 hours. Sepsis Labs: Recent Labs  Lab 12/13/24 1838 12/13/24 2043 12/13/24 2253 12/14/24 0329 12/14/24 0651 12/15/24 0312  WBC 7.2  --   --  8.7  --  8.6  LATICACIDVEN  --  3.2* 3.7* 2.6* 1.6  --     Microbiology Recent Results (from the past 240 hours)  Blood culture (routine x 2)     Status: None (Preliminary result)   Collection Time: 12/13/24  7:10 PM   Specimen: BLOOD LEFT HAND  Result Value Ref Range Status   Specimen Description BLOOD LEFT HAND BLOOD  Final   Special Requests   Final    AEROBIC BOTTLE ONLY Blood Culture results may not be optimal due to an inadequate volume of blood received in culture bottles   Culture   Final    NO GROWTH 2 DAYS Performed at [facility], [address] [place], [place] [address]    Report Status PENDING  Incomplete  Blood culture (routine x 2)     Status: None (Preliminary result)   Collection Time: 12/13/24  7:30 PM   Specimen: BLOOD  Result Value Ref Range Status   Specimen Description BLOOD RIGHT ANTECUBITAL  Final   Special Requests   Final    BOTTLES DRAWN AEROBIC AND ANAEROBIC Blood Culture adequate volume   Culture   Final    NO GROWTH 2 DAYS Performed at [facility], [address] [place], [place] [address]    Report Status PENDING  Incomplete  MRSA Next Gen by PCR, Nasal     Status: Abnormal   Collection Time: 12/13/24 11:18 PM   Specimen: Nasal Mucosa; Nasal Swab  Result Value Ref Range Status   MRSA by PCR Next Gen DETECTED  (A) NOT DETECTED Final    Comment: RESULT CALLED TO, READ BACK BY AND VERIFIED WITH: CODY,RN ON 12/17/23 AT 0616 BY PURDIE,J (NOTE) The GeneXpert MRSA Assay (FDA approved for NASAL specimens only), is one component of a comprehensive MRSA colonization surveillance program. It is not intended to diagnose MRSA infection nor to guide or monitor treatment for MRSA infections. Test performance is not FDA approved in patients less than 64 years old. Performed at [facility], [address], [place], [place] [address]     Procedures and diagnostic studies:  DG Chest Portable 1 View Result Date: 12/13/2024 EXAM: 1 VIEW(S) XRAY OF THE CHEST 12/13/2024 05:35:02 PM COMPARISON: 08/15/2024 CLINICAL HISTORY: Fatigue, weakness, and low potassium. FINDINGS: LINES, TUBES AND DEVICES: Left chest pacemaker with leads overlying right atrium and right ventricle. LUNGS AND PLEURA: Low lung volumes. No focal pulmonary opacity. No pleural effusion. No pneumothorax. HEART AND MEDIASTINUM: No acute abnormality of the cardiac and mediastinal silhouettes. BONES AND SOFT TISSUES: Right upper quadrant surgical clips. No acute osseous abnormality. IMPRESSION: 1. No acute findings. Electronically signed by: Oneil Devonshire MD 12/13/2024 05:42 PM EST RP Workstation: [id number]               LOS: 2 days   Kyah Buesing  [facility]   Pager on [email or web address]. If 7PM-7AM, please contact night-coverage at [email or web address]     12/15/2024, 10:14 AM           "

## 2024-12-16 DIAGNOSIS — R6521 Severe sepsis with septic shock: Secondary | ICD-10-CM | POA: Diagnosis not present

## 2024-12-16 DIAGNOSIS — A419 Sepsis, unspecified organism: Secondary | ICD-10-CM | POA: Diagnosis not present

## 2024-12-16 LAB — GLUCOSE, CAPILLARY
Glucose-Capillary: 101 mg/dL — ABNORMAL HIGH (ref 70–99)
Glucose-Capillary: 15 mg/dL — CL (ref 70–99)
Glucose-Capillary: 23 mg/dL — CL (ref 70–99)
Glucose-Capillary: 63 mg/dL — ABNORMAL LOW (ref 70–99)
Glucose-Capillary: 75 mg/dL (ref 70–99)
Glucose-Capillary: 78 mg/dL (ref 70–99)
Glucose-Capillary: 86 mg/dL (ref 70–99)

## 2024-12-16 LAB — GLUCOSE, RANDOM: Glucose, Bld: 65 mg/dL — ABNORMAL LOW (ref 70–99)

## 2024-12-16 MED ORDER — LIDOCAINE 5 % EX PTCH
1.0000 | MEDICATED_PATCH | CUTANEOUS | Status: DC
  Administered 2024-12-16 – 2024-12-17 (×2): 1 via TRANSDERMAL
  Filled 2024-12-16 (×2): qty 1

## 2024-12-16 MED ORDER — SODIUM CHLORIDE 0.9 % IV SOLN
1.0000 g | INTRAVENOUS | Status: AC
  Administered 2024-12-17: 1 g via INTRAVENOUS
  Filled 2024-12-16: qty 10

## 2024-12-16 NOTE — TOC Initial Note (Signed)
 Transition of Care [facility]) - Initial/Assessment Note    Patient Details  Name: [name] MRN: [id number] Date of Birth: 05/27/1931  [facility] of [facility]) CM/SW Contact:    Hoy DELENA Bigness, LCSW Phone Number: 12/16/2024, 10:50 AM  Clinical Narrative:                 CSW spoke with pt's daughter, Raoul, as pt is only oriented x 1. Pt is a LTC resident at [facility] Pt requires assistance with all ADL's. Pt is non-ambulatory at baseline and staff use hoyer lift for transfers. Pt is on oxygen at baseline. ICM will continue to follow for discharge planning.   Expected Discharge Plan: Long Term Nursing Home Barriers to Discharge: Continued Medical Work up   Patient Goals and CMS Choice Patient states their goals for this hospitalization and ongoing recovery are:: For pt to return to [facility] L[facility]          Expected Discharge Plan and Services In-house Referral: Clinical Social Work Discharge Planning Services: CM Consult Post Acute Care Choice: Nursing Home Living arrangements for the past 2 months: Skilled Nursing Facility                 DME Arranged: N/A DME Agency: NA                  Prior Living Arrangements/Services Living arrangements for the past 2 months: Skilled Nursing Facility Lives with:: Facility Resident Patient language and need for interpreter reviewed:: Yes Do you feel safe going back to the place where you live?: Yes      Need for Family Participation in Patient Care: Yes (Comment) Care giver support system in place?: Yes (comment) Current home services: DME (wheelchair, hoyer lift, O2) Criminal Activity/Legal Involvement Pertinent to Current Situation/Hospitalization: No - Comment as needed  Activities of Daily Living   ADL Screening (condition at time of admission) Independently performs ADLs?: No Does the patient have a NEW difficulty with bathing/dressing/toileting/self-feeding that is expected to last >3 days?: No Does the  patient have a NEW difficulty with getting in/out of bed, walking, or climbing stairs that is expected to last >3 days?: No Does the patient have a NEW difficulty with communication that is expected to last >3 days?: No Is the patient deaf or have difficulty hearing?: No Does the patient have difficulty seeing, even when wearing glasses/contacts?: No Does the patient have difficulty concentrating, remembering, or making decisions?: Yes  Permission Sought/Granted Permission sought to share information with : Facility Medical Sales Representative, Family Supports Permission granted to share information with :  (Pt only oriented x 1)  Share Information with NAME: Jenny Raoul  Daughter, Emergency Contact  [phone] (Mobile)  Permission granted to share info w AGENCY: [facility]        Emotional Assessment   Attitude/Demeanor/Rapport: Unable to Assess Affect (typically observed): Unable to Assess Orientation: : Oriented to Self Alcohol / Substance Use: Not Applicable Psych Involvement: No (comment)  Admission diagnosis:  UTI (urinary tract infection) [N39.0] Patient Active Problem List   Diagnosis Date Noted   Hypophosphatemia 12/14/2024   Septic shock (HCC) 08/15/2024   UTI (urinary tract infection) 08/15/2024   Chronic respiratory failure with hypoxia and hypercapnia c/w OHS) 06/19/2023   Positive occult stool blood test 06/01/2023   Anemia 06/01/2023   Type 2 diabetes mellitus with peripheral neuropathy (HCC) 12/23/2022   Gangrene of toe of left foot (HCC) 12/22/2022   Hyponatremia 12/22/2022   Dehydration 12/22/2022  Essential hypertension 12/22/2022   Complete heart block (HCC) 01/03/2022   Difficulty walking 06/01/2017   Gout 06/01/2017   Hypoglycemia 12/31/2015   Chronic edema 04/30/2015   Elevated troponin 04/30/2015   Peripheral edema 04/30/2015   Nocturnal leg cramps 04/06/2015   Pressure in chest 01/13/2015   Generalized weakness 08/06/2014   Protein-calorie  malnutrition, moderate (HCC) 08/06/2014   Osteoarthritis 04/29/2014   Anxiety 04/28/2014   Neck pain 04/28/2014   Weakness 04/21/2014   FTT (failure to thrive) in adult 04/21/2014   Chronic diastolic CHF (congestive heart failure) (HCC)    Pacemaker    Cardiac pacemaker 12/10/2011   Murmur    Hypokalemia    Ejection fraction    AV block    Pulmonary hypertension (HCC)    PCP:  Wilhelmina Cedar Pharmacy:   [facility] - [place], [place] - [address] [place] [address] Phone: [phone] Fax: [phone]     Social Drivers of Health (SDOH) Social History: SDOH Screenings   Food Insecurity: Patient Unable To Answer (12/13/2024)  Housing: Patient Unable To Answer (12/13/2024)  Transportation Needs: Patient Unable To Answer (12/13/2024)  Utilities: Patient Unable To Answer (12/13/2024)  Social Connections: Patient Unable To Answer (12/13/2024)  Tobacco Use: Low Risk (12/13/2024)   SDOH Interventions:     Readmission Risk Interventions     No data to display

## 2024-12-16 NOTE — NC FL2 (Signed)
 " [place]  MEDICAID FL2 LEVEL OF CARE FORM     IDENTIFICATION  Patient Name: [name] Birthdate: [birth year] Sex: female Admission Date (Current Location): 12/13/2024  [facility] and [place] Number:  [employer] and Address:  [facility],  [address] [place] [address]      Provider Number: [id number]  Attending Physician Name and Address:  Jens Durand, MD  Relative Name and Phone Number:  Jenny Belch  Daughter, Emergency Contact  [phone] (Mobile)    Current Level of Care: Hospital Recommended Level of Care: Nursing Facility Prior Approval Number:    Date Approved/Denied:   PASRR Number: [id number]  Discharge Plan: SNF    Current Diagnoses: Patient Active Problem List   Diagnosis Date Noted   Hypophosphatemia 12/14/2024   Septic shock (HCC) 08/15/2024   UTI (urinary tract infection) 08/15/2024   Chronic respiratory failure with hypoxia and hypercapnia c/w OHS) 06/19/2023   Positive occult stool blood test 06/01/2023   Anemia 06/01/2023   Type 2 diabetes mellitus with peripheral neuropathy (HCC) 12/23/2022   Gangrene of toe of left foot (HCC) 12/22/2022   Hyponatremia 12/22/2022   Dehydration 12/22/2022   Essential hypertension 12/22/2022   Complete heart block (HCC) 01/03/2022   Difficulty walking 06/01/2017   Gout 06/01/2017   Hypoglycemia 12/31/2015   Chronic edema 04/30/2015   Elevated troponin 04/30/2015   Peripheral edema 04/30/2015   Nocturnal leg cramps 04/06/2015   Pressure in chest 01/13/2015   Generalized weakness 08/06/2014   Protein-calorie malnutrition, moderate (HCC) 08/06/2014   Osteoarthritis 04/29/2014   Anxiety 04/28/2014   Neck pain 04/28/2014   Weakness 04/21/2014   FTT (failure to thrive) in adult 04/21/2014   Chronic diastolic CHF (congestive heart failure) (HCC)    Pacemaker    Cardiac pacemaker 12/10/2011   Murmur    Hypokalemia    Ejection fraction    AV block    Pulmonary hypertension  (HCC)     Orientation RESPIRATION BLADDER Height & Weight     Self  O2 (4L) Incontinent Weight: 143 lb 15.4 oz (65.3 kg) Height:     BEHAVIORAL SYMPTOMS/MOOD NEUROLOGICAL BOWEL NUTRITION STATUS      Incontinent Diet (See DC summary)  AMBULATORY STATUS COMMUNICATION OF NEEDS Skin   Total Care Verbally Other (Comment) (wound on right ankle and right buttocks)                       Personal Care Assistance Level of Assistance  Bathing, Feeding, Dressing Bathing Assistance: Maximum assistance Feeding assistance: Maximum assistance Dressing Assistance: Maximum assistance     Functional Limitations Info  Sight, Hearing, Speech Sight Info: Adequate Hearing Info: Adequate Speech Info: Adequate    SPECIAL CARE FACTORS FREQUENCY                       Contractures Contractures Info: Not present    Additional Factors Info  Code Status, Allergies Code Status Info: DNR Allergies Info: Aspirin , Tramadol , Naproxen, Shellfish Protein-containing Drug Products           Current Medications (12/16/2024):  This is the current hospital active medication list Current Facility-Administered Medications  Medication Dose Route Frequency Provider Last Rate Last Admin   acetaminophen  (TYLENOL ) tablet 650 mg  650 mg Oral Q6H PRN Adefeso, Oladapo, DO   650 mg at 12/16/24 9387   Or   acetaminophen  (TYLENOL ) suppository 650 mg  650 mg Rectal Q6H PRN Adefeso, Oladapo, DO  cefTRIAXone  (ROCEPHIN ) 1 g in sodium chloride  0.9 % 100 mL IVPB  1 g Intravenous Q24H Adefeso, Oladapo, DO 200 mL/hr at 12/16/24 1000 1 g at 12/16/24 1000   Chlorhexidine  Gluconate Cloth 2 % PADS 6 each  6 each Topical Daily Adefeso, Oladapo, DO   6 each at 12/16/24 1001   diclofenac  Sodium (VOLTAREN ) 1 % topical gel 2 g  2 g Topical QID PRN Adefeso, Oladapo, DO   2 g at 12/15/24 9167   heparin  injection 5,000 Units  5,000 Units Subcutaneous Q8H Adefeso, Oladapo, DO   5,000 Units at 12/16/24 9387   insulin  aspart  (novoLOG ) injection 0-15 Units  0-15 Units Subcutaneous TID WC Adefeso, Oladapo, DO   2 Units at 12/14/24 1133   lidocaine  (LIDODERM ) 5 % 1 patch  1 patch Transdermal Q24H Jens Durand, MD       mupirocin  ointment (BACTROBAN ) 2 % 1 Application  1 Application Nasal BID Adefeso, Oladapo, DO   1 Application at 12/16/24 1001   ondansetron  (ZOFRAN ) tablet 4 mg  4 mg Oral Q6H PRN Adefeso, Oladapo, DO       Or   ondansetron  (ZOFRAN ) injection 4 mg  4 mg Intravenous Q6H PRN Adefeso, Oladapo, DO       potassium chloride  SA (KLOR-CON  M) CR tablet 40 mEq  40 mEq Oral Once Suellen Cantor A, PA-C         Discharge Medications: Please see discharge summary for a list of discharge medications.  Relevant Imaging Results:  Relevant Lab Results:   Additional Information SSN: [id number]  Hoy DELENA Bigness, LCSW     "

## 2024-12-16 NOTE — Progress Notes (Addendum)
 "    Progress Note    CHARLITA [name]  FMW:[id number] DOB: 1931-03-18  DOA: 12/13/2024 PCP: Wilhelmina Cedar      Brief Narrative:    Medical records reviewed and are as summarized below:  [name] is a 89 y.o. female with medical history significant for hypertension, type II DM, chronic diastolic CHF, history of complete heart block s/p permanent pacemaker, pulmonary hypertension, who presented to the emergency department because of general weakness, burning urination and low potassium.  Reportedly, she has had poor oral intake in the past few weeks preceding admission and has not been eating nor drinking well.     Initial vital signs in the ED: Temperature 98.1 F, respiratory 16, pulse 92, BP 83/53, oxygen saturation 100% on 2 L.  BP dropped to 77/38, respiratory rate went up to 30 and oxygen saturation dropped to 84%.  At one point, she was requiring 15 L oxygen via nonrebreather mask.      Assessment/Plan:   Principal Problem:   Septic shock (HCC) Active Problems:   Hypokalemia   Chronic diastolic CHF (congestive heart failure) (HCC)   Type 2 diabetes mellitus with peripheral neuropathy (HCC)   UTI (urinary tract infection)   Hypophosphatemia    Body mass index is 29.08 kg/m.   Septic shock secondary to acute UTI: No growth on urine and blood cultures.  Plan to complete 5 days of IV ceftriaxone  on 12/17/2024.   S/p treatment with IV Levophed  infusion. Lactic acid down from 3.7-1.6.   Acute on chronic hypoxic respiratory failure: Taper down oxygen from 4 L to 2 L.   Hypokalemia: Improved.   Hypophosphatemia: Improved   Recurrent hypoglycemia: Glucose levels have improved.   Elevated liver enzymes: May be related to sepsis.  Continue to monitor.   Prolonged QTc interval at 539: Repeat EKG on 12/15/2024 showed improved QTc interval to 511. Avoid QT prolonging drugs as able.     Chronic low back pain: Added lidocaine  patch.   Comorbidities  include chronic diastolic CHF, type II DM, hypertension, permanent pacemaker for history of complete heart block   Transfer from telemetry unit to MedSurg unit.  Plan to discharge to SNF tomorrow.   Plan of care was discussed with Ms. [name], daughter, over the phone.  Diet Order             DIET DYS 2 Room service appropriate? Yes; Fluid consistency: Thin  Diet effective now                                  Consultants: None  Procedures: None    Medications:    Chlorhexidine  Gluconate Cloth  6 each Topical Daily   heparin   5,000 Units Subcutaneous Q8H   insulin  aspart  0-15 Units Subcutaneous TID WC   lidocaine   1 patch Transdermal Q24H   mupirocin  ointment  1 Application Nasal BID   potassium chloride  SA  40 mEq Oral Once   Continuous Infusions:  [START ON 12/17/2024] cefTRIAXone  (ROCEPHIN )  IV       Anti-infectives (From admission, onward)    Start     Dose/Rate Route Frequency Ordered Stop   12/17/24 1000  cefTRIAXone  (ROCEPHIN ) 1 g in sodium chloride  0.9 % 100 mL IVPB        1 g 200 mL/hr over 30 Minutes Intravenous Every 24 hours 12/16/24 1134 12/18/24 0959   12/14/24 1000  cefTRIAXone  (ROCEPHIN )  1 g in sodium chloride  0.9 % 100 mL IVPB  Status:  Discontinued        1 g 200 mL/hr over 30 Minutes Intravenous Every 24 hours 12/14/24 0454 12/16/24 1134   12/13/24 2015  cefTRIAXone  (ROCEPHIN ) 2 g in sodium chloride  0.9 % 100 mL IVPB        2 g 200 mL/hr over 30 Minutes Intravenous  Once 12/13/24 2009 12/13/24 2114              Family Communication/Anticipated D/C date and plan/Code Status   DVT prophylaxis: heparin  injection 5,000 Units Start: 12/14/24 0600 SCDs Start: 12/14/24 0257     Code Status: Limited: Do not attempt resuscitation (DNR) -DNR-LIMITED -Do Not Intubate/DNI   Family Communication: Plan of care was discussed with Ms. [name], daughter, over the phone. Disposition Plan: Plan to discharge to  SNF   Status is: Inpatient Remains inpatient appropriate because: Septic shock from UTI       Subjective:   Interval events noted.  She complains of low back pain which is chronic.  No other complaints.  Objective:    Vitals:   12/16/24 0820 12/16/24 0900 12/16/24 1000 12/16/24 1142  BP: 102/85 (!) 113/98 (!) 98/49 95/66  Pulse:      Resp:  20 12 17   Temp:      TempSrc:      SpO2:  98%  98%  Weight:       No data found.   Intake/Output Summary (Last 24 hours) at 12/16/2024 1334 Last data filed at 12/16/2024 0900 Gross per 24 hour  Intake 570 ml  Output --  Net 570 ml   Filed Weights   12/16/24 0437  Weight: 65.3 kg    Exam:  GEN: NAD SKIN: Warm and dry EYES: No pallor or icterus bilateral icterus ENT: MMM CV: RRR PULM: CTA B ABD: soft, ND, NT, +BS CNS: AAO x 2 (person and place), non focal EXT: Left AKA.  No edema or tenderness      Data Reviewed:   I have personally reviewed following labs and imaging studies:  Labs: Labs show the following:   Basic Metabolic Panel: Recent Labs  Lab 12/13/24 1838 12/14/24 0329 12/15/24 0312  NA 135 137 139  K 2.8* 2.6* 3.9  CL 96* 103 104  CO2 25 26 25   GLUCOSE 120* 100* 75  BUN 17 14 12   CREATININE 1.02* 0.95 0.78  CALCIUM  8.7* 8.0* 8.0*  MG 2.0 1.9 1.8  PHOS  --  1.8* 3.6   GFR Estimated Creatinine Clearance: 37.6 mL/min (by C-G formula based on SCr of 0.78 mg/dL). Liver Function Tests: Recent Labs  Lab 12/13/24 1838 12/14/24 0329 12/15/24 0312  AST 73* 62* 64*  ALT 35 31 36  ALKPHOS 191* 167* 171*  BILITOT 0.4 0.3 0.3  PROT 6.8 5.9* 5.7*  ALBUMIN  2.6* 2.3* 2.2*   No results for input(s): LIPASE, AMYLASE in the last 168 hours. No results for input(s): AMMONIA in the last 168 hours. Coagulation profile No results for input(s): INR, PROTIME in the last 168 hours.  CBC: Recent Labs  Lab 12/13/24 1838 12/14/24 0329 12/15/24 0312  WBC 7.2 8.7 8.6  NEUTROABS 5.2  --  6.3   HGB 11.3* 9.9* 9.8*  HCT 34.8* 30.6* 30.2*  MCV 89.0 90.0 89.6  PLT 156 144* 139*   Cardiac Enzymes: No results for input(s): CKTOTAL, CKMB, CKMBINDEX, TROPONINI in the last 168 hours. BNP (last 3 results) No results for  input(s): PROBNP in the last 8760 hours. CBG: Recent Labs  Lab 12/15/24 1143 12/15/24 1618 12/15/24 2100 12/16/24 0738 12/16/24 1135  GLUCAP 90 88 86 101* 78   D-Dimer: No results for input(s): DDIMER in the last 72 hours. Hgb A1c: Recent Labs    12/14/24 0651  HGBA1C 5.1   Lipid Profile: No results for input(s): CHOL, HDL, LDLCALC, TRIG, CHOLHDL, LDLDIRECT in the last 72 hours. Thyroid  function studies: No results for input(s): TSH, T4TOTAL, T3FREE, THYROIDAB in the last 72 hours.  Invalid input(s): FREET3 Anemia work up: No results for input(s): VITAMINB12, FOLATE, FERRITIN, TIBC, IRON, RETICCTPCT in the last 72 hours. Sepsis Labs: Recent Labs  Lab 12/13/24 1838 12/13/24 2043 12/13/24 2253 12/14/24 0329 12/14/24 0651 12/15/24 0312  WBC 7.2  --   --  8.7  --  8.6  LATICACIDVEN  --  3.2* 3.7* 2.6* 1.6  --     Microbiology Recent Results (from the past 240 hours)  Blood culture (routine x 2)     Status: None (Preliminary result)   Collection Time: 12/13/24  7:10 PM   Specimen: BLOOD LEFT HAND  Result Value Ref Range Status   Specimen Description BLOOD LEFT HAND BLOOD  Final   Special Requests   Final    AEROBIC BOTTLE ONLY Blood Culture results may not be optimal due to an inadequate volume of blood received in culture bottles   Culture   Final    NO GROWTH 3 DAYS Performed at [facility], [address] [place], [place] [address]    Report Status PENDING  Incomplete  Urine Culture     Status: Abnormal   Collection Time: 12/13/24  7:20 PM   Specimen: Urine, Clean Catch  Result Value Ref Range Status   Specimen Description   Final    URINE, CLEAN CATCH Performed at [facility],  [address] [place], [place] [address]    Special Requests   Final    NONE Performed at [facility], [address], [place], [place] [address]    Culture MULTIPLE SPECIES PRESENT, SUGGEST RECOLLECTION (A)  Final   Report Status 12/15/2024 FINAL  Final  Blood culture (routine x 2)     Status: None (Preliminary result)   Collection Time: 12/13/24  7:30 PM   Specimen: BLOOD  Result Value Ref Range Status   Specimen Description BLOOD RIGHT ANTECUBITAL  Final   Special Requests   Final    BOTTLES DRAWN AEROBIC AND ANAEROBIC Blood Culture adequate volume   Culture   Final    NO GROWTH 3 DAYS Performed at [facility], [address] [place], [place] [address]    Report Status PENDING  Incomplete  MRSA Next Gen by PCR, Nasal     Status: Abnormal   Collection Time: 12/13/24 11:18 PM   Specimen: Nasal Mucosa; Nasal Swab  Result Value Ref Range Status   MRSA by PCR Next Gen DETECTED (A) NOT DETECTED Final    Comment: RESULT CALLED TO, READ BACK BY AND VERIFIED WITH: CODY,RN ON 12/17/23 AT 0616 BY PURDIE,J (NOTE) The GeneXpert MRSA Assay (FDA approved for NASAL specimens only), is one component of a comprehensive MRSA colonization surveillance program. It is not intended to diagnose MRSA infection nor to guide or monitor treatment for MRSA infections. Test performance is not FDA approved in patients less than 34 years old. Performed at [facility], [address] [place], [place] [address]     Procedures and diagnostic studies:  No results  found.              LOS: 3 days   Jhordan Kinter  [facility]   Pager on [email or web address]. If 7PM-7AM, please contact night-coverage at [email or web address]     12/16/2024, 1:34 PM           "

## 2024-12-17 LAB — GLUCOSE, CAPILLARY
Glucose-Capillary: 76 mg/dL (ref 70–99)
Glucose-Capillary: 78 mg/dL (ref 70–99)
Glucose-Capillary: 11 mg/dL — CL (ref 70–99)
Glucose-Capillary: 43 mg/dL — CL (ref 70–99)
Glucose-Capillary: 83 mg/dL (ref 70–99)

## 2024-12-17 MED ORDER — ACETAMINOPHEN 325 MG PO TABS: 650.0000 mg | ORAL_TABLET | ORAL | Status: AC | PRN

## 2024-12-17 NOTE — Plan of Care (Signed)
" °  Problem: Education: Goal: Knowledge of General Education information will improve Description: Including pain rating scale, medication(s)/side effects and non-pharmacologic comfort measures Outcome: Progressing   Problem: Clinical Measurements: Goal: Ability to maintain clinical measurements within normal limits will improve Outcome: Progressing   Problem: Activity: Goal: Risk for activity intolerance will decrease Outcome: Progressing   Problem: Nutrition: Goal: Adequate nutrition will be maintained Outcome: Progressing   Problem: Pain Managment: Goal: General experience of comfort will improve and/or be controlled Outcome: Progressing   Problem: Safety: Goal: Ability to remain free from injury will improve Outcome: Progressing   Problem: Skin Integrity: Goal: Risk for impaired skin integrity will decrease Outcome: Progressing   Problem: Fluid Volume: Goal: Ability to maintain a balanced intake and output will improve Outcome: Progressing   "

## 2024-12-17 NOTE — Progress Notes (Signed)
 Blood glucose checked and noted to low around 2208H. Patient assessed and found hypoglycemic. Juice given as per hypoglycemia protocol. Blood glucose rechecked per protocol with gradual improvement, latest blood glucose level is 86 mg/dl. Patient tolerated intervention well. Press photographer and provider on duty notified. Will continue to monitor closely.

## 2024-12-17 NOTE — Inpatient Diabetes Management (Signed)
 Inpatient Diabetes Program Recommendations  AACE/ADA: New Consensus Statement on Inpatient Glycemic Control   Target Ranges:  Prepandial:   less than 140 mg/dL      Peak postprandial:   less than 180 mg/dL (1-2 hours)      Critically ill patients:  140 - 180 mg/dL    Latest Reference Range & Units 12/16/24 07:38 12/16/24 11:35 12/16/24 16:48 12/16/24 22:05 12/16/24 22:27 12/16/24 23:11 12/16/24 23:49 12/17/24 07:38  Glucose-Capillary 70 - 99 mg/dL 898 (H) 78 75 23 (LL) 15 (LL) 63 (L) 86 78    Latest Reference Range & Units 12/14/24 06:51  Hemoglobin A1C 4.8 - 5.6 % 5.1   Review of Glycemic Control  Diabetes history: DM2 Outpatient Diabetes medications: Januvia 50 mg daily Current orders for Inpatient glycemic control: Novolog  0-15 units TID with meals  Inpatient Diabetes Program Recommendations:    Insulin : No insulin  given in past 3 days and patient has experienced recurrent hypoglycemia.  May want to consider discontinuing Novolog  correction or at least decrease to 0-6 units TID with meals.  Outpatient DM: Given recurrent hypoglycemia and A1C of 5.1% on 12/14/24, may need to consider stopping Januvia outpatient.  Thanks, Earnie Gainer, RN, MSN, CDCES Diabetes Coordinator Inpatient Diabetes Program [phone] (Team Pager from 8am to 5pm)

## 2024-12-17 NOTE — Plan of Care (Signed)

## 2024-12-17 NOTE — Discharge Summary (Signed)
 " Physician Discharge Summary   PATRICIAANN RABANAL FMW:[id number] DOB: 1931-08-01 DOA: 12/13/2024  PCP: Wilhelmina Cedar  Admit date: 12/13/2024 Discharge date: 12/17/2024  Admitted From: Home Disposition:  SNF Discharging physician: Alm Apo, MD Barriers to discharge: none   Discharge Condition: stable CODE STATUS: DNR Diet recommendation:  Diet Orders (From admission, onward)     Start     Ordered   12/14/24 1340  DIET DYS 2 Room service appropriate? Yes; Fluid consistency: Thin  Diet effective now       Question Answer Comment  Room service appropriate? Yes   Fluid consistency: Thin      12/14/24 1339            Hospital Course: [name] is a 89 y.o. female with medical history significant for hypertension, type II DM, chronic diastolic CHF, history of complete heart block s/p permanent pacemaker, pulmonary hypertension, who presented to the emergency department because of general weakness, burning urination and low potassium.  Reportedly, she has had poor oral intake in the past few weeks preceding admission and has not been eating nor drinking well.     Initial vital signs in the ED: Temperature 98.1 F, respiratory 16, pulse 92, BP 83/53, oxygen saturation 100% on 2 L.  BP dropped to 77/38, respiratory rate went up to 30 and oxygen saturation dropped to 84%.  At one point, she was requiring 15 L oxygen via nonrebreather mask.  On workup on admission, suspicion was high for UTI and she was initiated on antibiotics.  Urine culture was inconclusive.  She completed empiric course of Rocephin  during hospitalization. She had also required management in the ICU with vasopressor support as well. She responded well clinically and was weaned from pressors.      Assessment/Plan:      Septic shock secondary to acute UTI: -  No growth on urine and blood cultures.  Plan to complete 5 days of IV ceftriaxone  on 12/17/2024.   -Treated with Levophed  in the ICU - Completed 5  days Rocephin  on 12/17/2024 -Lactic normalized  Acute on chronic hypoxic respiratory failure - On 2 L oxygen at home    Hypokalemia -Repleted   Hypophosphatemia - Repleted   Recurrent hypoglycemia:  -Resolved - Glucose levels have improved.   Elevated liver enzymes: May be related to sepsis.  Stable   Prolonged QTc interval at 539: Repeat EKG on 12/15/2024 showed improved QTc interval to 511 Avoid QT prolonging drugs as able   Chronic low back pain-Tylenol    Comorbidities include chronic diastolic CHF, type II DM, hypertension, permanent pacemaker for history of complete heart block   Principal Diagnosis: Septic shock [facility])  Discharge Diagnoses: Active Hospital Problems   Diagnosis Date Noted   Septic shock (HCC) 08/15/2024   Hypokalemia     Priority: 1.   Chronic diastolic CHF (congestive heart failure) (HCC)     Priority: 5.   Hypophosphatemia 12/14/2024   UTI (urinary tract infection) 08/15/2024   Type 2 diabetes mellitus with peripheral neuropathy (HCC) 12/23/2022    Resolved Hospital Problems  No resolved problems to display.     Discharge Instructions     Increase activity slowly   Complete by: As directed       Allergies as of 12/17/2024       Reactions   Aspirin  Rash, Other (See Comments)   GI distress   Tramadol  Other (See Comments)   GI distress   Naproxen Other (See Comments)   Unknown reaction  Shellfish Protein-containing Drug Products Itching, Rash        Medication List     STOP taking these medications    oxyCODONE  5 MG immediate release tablet Commonly known as: Oxy IR/ROXICODONE    pregabalin 25 MG capsule Commonly known as: LYRICA   sucralfate 1 g tablet Commonly known as: CARAFATE       TAKE these medications    acetaminophen  325 MG tablet Commonly known as: TYLENOL  Take 2 tablets (650 mg total) by mouth every 4 (four) hours as needed for mild pain (pain score 1-3), fever or moderate pain (pain score 4-6) (or Fever  >/= 101).   albuterol 108 (90 Base) MCG/ACT inhaler Commonly known as: VENTOLIN HFA Inhale 2 puffs into the lungs every 4 (four) hours as needed for shortness of breath.   allopurinol  100 MG tablet Commonly known as: ZYLOPRIM  Take 100 mg by mouth daily.   ammonium lactate 12 % cream Commonly known as: AMLACTIN Apply 1 Application topically in the morning and at bedtime.   BIOFREEZE COLORLESS EX Apply 1 application  topically in the morning, at noon, and at bedtime. for neck pain   Biotin  5000 MCG Caps Take 5,000 mcg by mouth daily. (0900)   carvedilol  6.25 MG tablet Commonly known as: COREG  Take 6.25 mg by mouth 2 (two) times daily with a meal. Hold for HR <60 (0900 & 1700)   cetirizine 10 MG tablet Commonly known as: ZYRTEC Take 10 mg by mouth daily.   colchicine  0.6 MG tablet Take 0.6 mg by mouth 2 (two) times daily.   Cranberry 300 MG tablet Take 300 mg by mouth daily.   cyclobenzaprine  5 MG tablet Commonly known as: FLEXERIL  Take 1 tablet (5 mg total) by mouth 3 (three) times daily as needed for muscle spasms. What changed: when to take this   estradiol  0.1 MG/GM vaginal cream Commonly known as: ESTRACE  Discard plastic applicator. Insert a blueberry size amount (approximately 1 gram) of cream on gloved fingertip inside vagina at bedtime every night. For long term use. What changed:  how much to take how to take this when to take this   eucerin lotion Apply 1 Application topically every 12 (twelve) hours as needed for dry skin. Apply to Rt foot/ankle/leg   ferrous sulfate 325 (65 FE) MG EC tablet Take 325 mg by mouth daily with breakfast.   furosemide  20 MG tablet Commonly known as: LASIX  Take 10 mg by mouth daily.   Magnesium  250 MG Tabs Take 1 tablet by mouth daily.   melatonin 3 MG Tabs tablet Take 3 mg by mouth at bedtime. (2100)   memantine 5 MG tablet Commonly known as: NAMENDA Take 5 mg by mouth daily.   nitroGLYCERIN  0.4 MG SL  tablet Commonly known as: NITROSTAT  Place 0.4 mg under the tongue every 5 (five) minutes as needed for chest pain (X 3 DOSES FOR CHEST PAIN).   OXYGEN Inhale 2 L/min into the lungs continuous. Keep O2 saturation greater than 90%   pantoprazole  40 MG tablet Commonly known as: PROTONIX  Take 40 mg by mouth daily. 0830   polyethylene glycol powder 17 GM/SCOOP powder Commonly known as: GLYCOLAX /MIRALAX  Take 17 g by mouth daily. (0900)   potassium chloride  10 MEQ tablet Commonly known as: KLOR-CON  Take 10 mEq by mouth daily.   rosuvastatin 40 MG tablet Commonly known as: CRESTOR Take 40 mg by mouth daily.   sitaGLIPtin 50 MG tablet Commonly known as: JANUVIA Take 50 mg by mouth in the morning. (  0900)   vitamin A & D ointment Apply 1 Application topically See admin instructions. Apply to buttocks/gluteal folds/Rt rear thigh for prevention of moisture associated skin damage. Apply after incontinence care every shift. MAR has 3 daily applications for this med as well as a PRN order every 12 hours as needed.   Vitamin D3 50 MCG (2000 UT) Tabs Take 50 mcg by mouth daily. 0900        Allergies[1]  Consultations:   Procedures:   Discharge Exam: BP (!) 111/41 (BP Location: Left Arm)   Pulse 69   Temp 99.1 F (37.3 C) (Oral)   Resp 18   Wt 65.3 kg   SpO2 100%   BMI 29.08 kg/m  Physical Exam Constitutional:      General: She is not in acute distress.    Appearance: Normal appearance. She is obese. She is not ill-appearing.  HENT:     Head: Normocephalic and atraumatic.     Mouth/Throat:     Mouth: Mucous membranes are moist.  Eyes:     Extraocular Movements: Extraocular movements intact.  Cardiovascular:     Rate and Rhythm: Normal rate and regular rhythm.  Pulmonary:     Effort: Pulmonary effort is normal. No respiratory distress.     Breath sounds: Normal breath sounds. No wheezing.  Abdominal:     General: Bowel sounds are normal. There is no distension.      Palpations: Abdomen is soft.     Tenderness: There is no abdominal tenderness.  Musculoskeletal:     Cervical back: Normal range of motion and neck supple.     Comments: Left AKA noted  Skin:    General: Skin is warm and dry.  Neurological:     General: No focal deficit present.     Mental Status: She is alert.  Psychiatric:        Mood and Affect: Mood normal.      The results of significant diagnostics from this hospitalization (including imaging, microbiology, ancillary and laboratory) are listed below for reference.   Microbiology: Recent Results (from the past 240 hours)  Blood culture (routine x 2)     Status: None (Preliminary result)   Collection Time: 12/13/24  7:10 PM   Specimen: BLOOD LEFT HAND  Result Value Ref Range Status   Specimen Description BLOOD LEFT HAND BLOOD  Final   Special Requests   Final    AEROBIC BOTTLE ONLY Blood Culture results may not be optimal due to an inadequate volume of blood received in culture bottles   Culture   Final    NO GROWTH 4 DAYS Performed at [facility], [address] [place], [place] [address]    Report Status PENDING  Incomplete  Urine Culture     Status: Abnormal   Collection Time: 12/13/24  7:20 PM   Specimen: Urine, Clean Catch  Result Value Ref Range Status   Specimen Description   Final    URINE, CLEAN CATCH Performed at [facility], [address] [place], [place] [address]    Special Requests   Final    NONE Performed at [facility], [address], [place], [place] [address]    Culture MULTIPLE SPECIES PRESENT, SUGGEST RECOLLECTION (A)  Final   Report Status 12/15/2024 FINAL  Final  Blood culture (routine x 2)     Status: None (Preliminary result)   Collection Time: 12/13/24  7:30 PM   Specimen: BLOOD  Result Value Ref Range Status   Specimen  Description BLOOD RIGHT ANTECUBITAL  Final   Special Requests   Final    BOTTLES DRAWN AEROBIC AND ANAEROBIC Blood Culture adequate volume   Culture   Final    NO  GROWTH 4 DAYS Performed at [facility], [address], [place], [place] [address]    Report Status PENDING  Incomplete  MRSA Next Gen by PCR, Nasal     Status: Abnormal   Collection Time: 12/13/24 11:18 PM   Specimen: Nasal Mucosa; Nasal Swab  Result Value Ref Range Status   MRSA by PCR Next Gen DETECTED (A) NOT DETECTED Final    Comment: RESULT CALLED TO, READ BACK BY AND VERIFIED WITH: CODY,RN ON 12/17/23 AT 0616 BY PURDIE,J (NOTE) The GeneXpert MRSA Assay (FDA approved for NASAL specimens only), is one component of a comprehensive MRSA colonization surveillance program. It is not intended to diagnose MRSA infection nor to guide or monitor treatment for MRSA infections. Test performance is not FDA approved in patients less than 49 years old. Performed at [facility], [address], [place], [place] [address]      Labs: BNP (last 3 results) No results for input(s): BNP in the last 8760 hours. Basic Metabolic Panel: Recent Labs  Lab 12/13/24 1838 12/14/24 0329 12/15/24 0312 12/16/24 2315  NA 135 137 139  --   K 2.8* 2.6* 3.9  --   CL 96* 103 104  --   CO2 25 26 25   --   GLUCOSE 120* 100* 75 65*  BUN 17 14 12   --   CREATININE 1.02* 0.95 0.78  --   CALCIUM  8.7* 8.0* 8.0*  --   MG 2.0 1.9 1.8  --   PHOS  --  1.8* 3.6  --    Liver Function Tests: Recent Labs  Lab 12/13/24 1838 12/14/24 0329 12/15/24 0312  AST 73* 62* 64*  ALT 35 31 36  ALKPHOS 191* 167* 171*  BILITOT 0.4 0.3 0.3  PROT 6.8 5.9* 5.7*  ALBUMIN  2.6* 2.3* 2.2*   No results for input(s): LIPASE, AMYLASE in the last 168 hours. No results for input(s): AMMONIA in the last 168 hours. CBC: Recent Labs  Lab 12/13/24 1838 12/14/24 0329 12/15/24 0312  WBC 7.2 8.7 8.6  NEUTROABS 5.2  --  6.3  HGB 11.3* 9.9* 9.8*  HCT 34.8* 30.6* 30.2*  MCV 89.0 90.0 89.6  PLT 156 144* 139*   Cardiac Enzymes: No results for input(s): CKTOTAL, CKMB, CKMBINDEX, TROPONINI in the last 168  hours. BNP: Invalid input(s): POCBNP CBG: Recent Labs  Lab 12/16/24 2311 12/16/24 2349 12/17/24 0738 12/17/24 1132 12/17/24 1140  GLUCAP 63* 86 78 11* 43*   D-Dimer No results for input(s): DDIMER in the last 72 hours. Hgb A1c No results for input(s): HGBA1C in the last 72 hours. Lipid Profile No results for input(s): CHOL, HDL, LDLCALC, TRIG, CHOLHDL, LDLDIRECT in the last 72 hours. Thyroid  function studies No results for input(s): TSH, T4TOTAL, T3FREE, THYROIDAB in the last 72 hours.  Invalid input(s): FREET3 Anemia work up No results for input(s): VITAMINB12, FOLATE, FERRITIN, TIBC, IRON, RETICCTPCT in the last 72 hours. Urinalysis    Component Value Date/Time   COLORURINE YELLOW 12/13/2024 1920   APPEARANCEUR TURBID (A) 12/13/2024 1920   LABSPEC 1.009 12/13/2024 1920   PHURINE 7.0 12/13/2024 1920   GLUCOSEU NEGATIVE 12/13/2024 1920   HGBUR LARGE (A) 12/13/2024 1920   BILIRUBINUR NEGATIVE 12/13/2024 1920   KETONESUR NEGATIVE 12/13/2024 1920   PROTEINUR 100 (A) 12/13/2024 1920  UROBILINOGEN 0.2 08/06/2014 2250   NITRITE NEGATIVE 12/13/2024 1920   LEUKOCYTESUR LARGE (A) 12/13/2024 1920   Sepsis Labs Recent Labs  Lab 12/13/24 1838 12/14/24 0329 12/15/24 0312  WBC 7.2 8.7 8.6   Microbiology Recent Results (from the past 240 hours)  Blood culture (routine x 2)     Status: None (Preliminary result)   Collection Time: 12/13/24  7:10 PM   Specimen: BLOOD LEFT HAND  Result Value Ref Range Status   Specimen Description BLOOD LEFT HAND BLOOD  Final   Special Requests   Final    AEROBIC BOTTLE ONLY Blood Culture results may not be optimal due to an inadequate volume of blood received in culture bottles   Culture   Final    NO GROWTH 4 DAYS Performed at [facility], [address] [place], [place] [address]    Report Status PENDING  Incomplete  Urine Culture     Status: Abnormal   Collection Time: 12/13/24  7:20 PM    Specimen: Urine, Clean Catch  Result Value Ref Range Status   Specimen Description   Final    URINE, CLEAN CATCH Performed at [facility], [address] [place], [place] [address]    Special Requests   Final    NONE Performed at [facility], [address] [place], [place] [address]    Culture MULTIPLE SPECIES PRESENT, SUGGEST RECOLLECTION (A)  Final   Report Status 12/15/2024 FINAL  Final  Blood culture (routine x 2)     Status: None (Preliminary result)   Collection Time: 12/13/24  7:30 PM   Specimen: BLOOD  Result Value Ref Range Status   Specimen Description BLOOD RIGHT ANTECUBITAL  Final   Special Requests   Final    BOTTLES DRAWN AEROBIC AND ANAEROBIC Blood Culture adequate volume   Culture   Final    NO GROWTH 4 DAYS Performed at [facility], [address], [place], [place] [address]    Report Status PENDING  Incomplete  MRSA Next Gen by PCR, Nasal     Status: Abnormal   Collection Time: 12/13/24 11:18 PM   Specimen: Nasal Mucosa; Nasal Swab  Result Value Ref Range Status   MRSA by PCR Next Gen DETECTED (A) NOT DETECTED Final    Comment: RESULT CALLED TO, READ BACK BY AND VERIFIED WITH: CODY,RN ON 12/17/23 AT 0616 BY PURDIE,J (NOTE) The GeneXpert MRSA Assay (FDA approved for NASAL specimens only), is one component of a comprehensive MRSA colonization surveillance program. It is not intended to diagnose MRSA infection nor to guide or monitor treatment for MRSA infections. Test performance is not FDA approved in patients less than 59 years old. Performed at [facility], [address], [place], [place] [address]     Procedures/Studies: DG Chest Portable 1 View Result Date: 12/13/2024 EXAM: 1 VIEW(S) XRAY OF THE CHEST 12/13/2024 05:35:02 PM COMPARISON: 08/15/2024 CLINICAL HISTORY: Fatigue, weakness, and low potassium. FINDINGS: LINES, TUBES AND DEVICES: Left chest pacemaker with leads overlying right atrium and right ventricle. LUNGS AND PLEURA: Low lung volumes. No  focal pulmonary opacity. No pleural effusion. No pneumothorax. HEART AND MEDIASTINUM: No acute abnormality of the cardiac and mediastinal silhouettes. BONES AND SOFT TISSUES: Right upper quadrant surgical clips. No acute osseous abnormality. IMPRESSION: 1. No acute findings. Electronically signed by: Oneil Devonshire MD 12/13/2024 05:42 PM EST RP Workstation: [id number]   VAS US  ABI WITH/WO TBI Result Date: 12/11/2024  LOWER EXTREMITY DOPPLER STUDY Patient Name:  ALDEEN RIGA  Date of  Exam:   12/11/2024 Medical Rec #: [id number]        Accession #:    [id number] Date of Birth: May 15, 1931       Patient Gender: F Patient Age:   53 years Exam Location:  [address] Procedure:      VAS US  ABI WITH/WO TBI Referring Phys: DEBBY ROBERTSON --------------------------------------------------------------------------------  Indications: Rest pain, ulceration, and peripheral artery disease. left AKA High Risk Factors: Hypertension, Diabetes, no history of smoking. Other Factors: CHF.  Limitations: Today's exam was limited due to patient in wheelchair. Comparison Study: 02/18/2022 Performing Technologist: Stoney Ross RVT  Examination Guidelines: A complete evaluation includes at minimum, Doppler waveform signals and systolic blood pressure reading at the level of bilateral brachial, anterior tibial, and posterior tibial arteries, when vessel segments are accessible. Bilateral testing is considered an integral part of a complete examination. Photoelectric Plethysmograph (PPG) waveforms and toe systolic pressure readings are included as required and additional duplex testing as needed. Limited examinations for reoccurring indications may be performed as noted.  ABI Findings: +---------+------------------+-----+-------------------+----------+ Right    Rt Pressure (mmHg)IndexWaveform           Comment    +---------+------------------+-----+-------------------+----------+ Brachial 100                                                   +---------+------------------+-----+-------------------+----------+ ATA      63                0.55 dampened monophasic           +---------+------------------+-----+-------------------+----------+ PTA      8                 0.07 dampened monophasicvery faint +---------+------------------+-----+-------------------+----------+ Great Toe35                0.30                               +---------+------------------+-----+-------------------+----------+ +--------+------------------+-----+--------+-------+ Left    Lt Pressure (mmHg)IndexWaveformComment +--------+------------------+-----+--------+-------+ [id number]                                    +--------+------------------+-----+--------+-------+ +-------+-----------+-----------+------------+------------+ ABI/TBIToday's ABIToday's TBIPrevious ABIPrevious TBI +-------+-----------+-----------+------------+------------+ Right  0.55       0.30       0.74        0.38         +-------+-----------+-----------+------------+------------+ Left   AKA        AKA        0.49                     +-------+-----------+-----------+------------+------------+  Right ABIs and TBIs appear decreased compared to prior study on 02/18/2022.  Summary: Right: Resting right ankle-brachial index indicates moderate right lower extremity arterial disease. The right toe-brachial index is abnormal.  *See table(s) above for measurements and observations.  Electronically signed by Norman Serve on 12/11/2024 at 2:38:25 PM.    Final      Time coordinating discharge: Over 30 minutes    Alm Apo, MD  [facility] 12/17/2024, 11:56 AM    [1]  Allergies Allergen Reactions   Aspirin  Rash and Other (See Comments)    GI distress   Tramadol   Other (See Comments)    GI distress   Naproxen Other (See Comments)    Unknown reaction   Shellfish Protein-Containing Drug Products Itching and Rash   "

## 2024-12-17 NOTE — Hospital Course (Addendum)
 [name] is a 89 y.o. female with medical history significant for hypertension, type II DM, chronic diastolic CHF, history of complete heart block s/p permanent pacemaker, pulmonary hypertension, who presented to the emergency department because of general weakness, burning urination and low potassium.  Reportedly, she has had poor oral intake in the past few weeks preceding admission and has not been eating nor drinking well.     Initial vital signs in the ED: Temperature 98.1 F, respiratory 16, pulse 92, BP 83/53, oxygen saturation 100% on 2 L.  BP dropped to 77/38, respiratory rate went up to 30 and oxygen saturation dropped to 84%.  At one point, she was requiring 15 L oxygen via nonrebreather mask.  On workup on admission, suspicion was high for UTI and she was initiated on antibiotics.  Urine culture was inconclusive.  She completed empiric course of Rocephin  during hospitalization. She had also required management in the ICU with vasopressor support as well. She responded well clinically and was weaned from pressors.      Assessment/Plan:      Septic shock secondary to acute UTI: -  No growth on urine and blood cultures.  Plan to complete 5 days of IV ceftriaxone  on 12/17/2024.   -Treated with Levophed  in the ICU - Completed 5 days Rocephin  on 12/17/2024 -Lactic normalized  Acute on chronic hypoxic respiratory failure - On 2 L oxygen at home    Hypokalemia -Repleted   Hypophosphatemia - Repleted   Recurrent hypoglycemia:  -Resolved - Glucose levels have improved.   Elevated liver enzymes: May be related to sepsis.  Stable   Prolonged QTc interval at 539: Repeat EKG on 12/15/2024 showed improved QTc interval to 511 Avoid QT prolonging drugs as able   Chronic low back pain-Tylenol    Comorbidities include chronic diastolic CHF, type II DM, hypertension, permanent pacemaker for history of complete heart block

## 2024-12-17 NOTE — TOC Transition Note (Addendum)
 Transition of Care [facility]) - Discharge Note   Patient Details  Name: [name] MRN: [id number] Date of Birth: 31-Jul-1931  Transition [facility]) CM/SW Contact:  Noreen KATHEE Cleotilde ISRAEL Phone Number: 12/17/2024, 12:51 PM   Clinical Narrative:     CSW spoke with Leontine earlier since Ozell was not responding, to confirm if patient can return back today. Logan confirmed and provided room and report number. Room and report number was given to nurse. Daughter Raoul made aware. ICM signing off.   Med necessity form completed and patient has been added to EMS List for pick up.   Final next level of care: Long Term Nursing Home Barriers to Discharge: Barriers Resolved   Patient Goals and CMS Choice Patient states their goals for this hospitalization and ongoing recovery are:: return back to [email or web address] Compare Post Acute Care list provided to:: Patient Represenative (must comment) (Daughter Oneida) Choice offered to / list presented to : Adult Children      Discharge Placement              Patient [facility] at: [facility] Patient to be transferred to facility by: [facility] Name of family member notified: [name] Patient and family notified of of transfer: 12/17/24  Discharge Plan and Services Additional resources added to the After Visit Summary for   In-house Referral: Clinical Social Work Discharge Planning Services: CM Consult Post Acute Care Choice: Nursing Home          DME Arranged: N/A DME Agency: NA                  Social Drivers of Health (SDOH) Interventions SDOH Screenings   Food Insecurity: Patient Unable To Answer (12/13/2024)  Housing: Patient Unable To Answer (12/13/2024)  Transportation Needs: Patient Unable To Answer (12/13/2024)  Utilities: Patient Unable To Answer (12/13/2024)  Social Connections: Patient Unable To Answer (12/13/2024)  Tobacco Use: Low Risk (12/13/2024)     Readmission Risk Interventions    12/17/2024   11:42 AM   Readmission Risk Prevention Plan  Transportation Screening Complete  Home Care Screening Complete  Medication Review (RN CM) Complete

## 2024-12-17 NOTE — Care Management Important Message (Signed)
 Important Message  Patient Details  Name: [name] MRN: [id number] Date of Birth: 1931-06-05   Important Message Given:  N/A - LOS <3 / Initial given by admissions     [name] Ada 12/17/2024, 2:02 PM

## 2024-12-18 LAB — CULTURE, BLOOD (ROUTINE X 2)
Culture: NO GROWTH
Culture: NO GROWTH
Special Requests: ADEQUATE

## 2024-12-19 ENCOUNTER — Other Ambulatory Visit: Payer: Self-pay

## 2024-12-19 ENCOUNTER — Inpatient Hospital Stay (HOSPITAL_COMMUNITY)
Admission: EM | Admit: 2024-12-19 | Discharge: 2024-12-25 | DRG: 637 | Disposition: A | Source: Skilled Nursing Facility | Attending: Family Medicine | Admitting: Family Medicine

## 2024-12-19 DIAGNOSIS — R4182 Altered mental status, unspecified: Secondary | ICD-10-CM | POA: Diagnosis present

## 2024-12-19 DIAGNOSIS — Z96641 Presence of right artificial hip joint: Secondary | ICD-10-CM | POA: Diagnosis present

## 2024-12-19 DIAGNOSIS — Z1612 Extended spectrum beta lactamase (ESBL) resistance: Secondary | ICD-10-CM | POA: Diagnosis present

## 2024-12-19 DIAGNOSIS — Z950 Presence of cardiac pacemaker: Secondary | ICD-10-CM

## 2024-12-19 DIAGNOSIS — Z66 Do not resuscitate: Secondary | ICD-10-CM | POA: Diagnosis present

## 2024-12-19 DIAGNOSIS — Z9049 Acquired absence of other specified parts of digestive tract: Secondary | ICD-10-CM

## 2024-12-19 DIAGNOSIS — Z7984 Long term (current) use of oral hypoglycemic drugs: Secondary | ICD-10-CM

## 2024-12-19 DIAGNOSIS — Z8249 Family history of ischemic heart disease and other diseases of the circulatory system: Secondary | ICD-10-CM

## 2024-12-19 DIAGNOSIS — Z885 Allergy status to narcotic agent status: Secondary | ICD-10-CM

## 2024-12-19 DIAGNOSIS — R001 Bradycardia, unspecified: Secondary | ICD-10-CM | POA: Diagnosis present

## 2024-12-19 DIAGNOSIS — Z6830 Body mass index (BMI) 30.0-30.9, adult: Secondary | ICD-10-CM

## 2024-12-19 DIAGNOSIS — Z833 Family history of diabetes mellitus: Secondary | ICD-10-CM

## 2024-12-19 DIAGNOSIS — G9341 Metabolic encephalopathy: Secondary | ICD-10-CM | POA: Diagnosis present

## 2024-12-19 DIAGNOSIS — E876 Hypokalemia: Secondary | ICD-10-CM | POA: Diagnosis present

## 2024-12-19 DIAGNOSIS — Z79899 Other long term (current) drug therapy: Secondary | ICD-10-CM

## 2024-12-19 DIAGNOSIS — Z89612 Acquired absence of left leg above knee: Secondary | ICD-10-CM

## 2024-12-19 DIAGNOSIS — N3001 Acute cystitis with hematuria: Principal | ICD-10-CM | POA: Diagnosis present

## 2024-12-19 DIAGNOSIS — E669 Obesity, unspecified: Secondary | ICD-10-CM | POA: Diagnosis present

## 2024-12-19 DIAGNOSIS — R0902 Hypoxemia: Secondary | ICD-10-CM | POA: Diagnosis present

## 2024-12-19 DIAGNOSIS — Z90711 Acquired absence of uterus with remaining cervical stump: Secondary | ICD-10-CM

## 2024-12-19 DIAGNOSIS — Z9981 Dependence on supplemental oxygen: Secondary | ICD-10-CM

## 2024-12-19 DIAGNOSIS — R5383 Other fatigue: Secondary | ICD-10-CM | POA: Diagnosis present

## 2024-12-19 DIAGNOSIS — B9620 Unspecified Escherichia coli [E. coli] as the cause of diseases classified elsewhere: Secondary | ICD-10-CM | POA: Diagnosis present

## 2024-12-19 DIAGNOSIS — Z91013 Allergy to seafood: Secondary | ICD-10-CM

## 2024-12-19 DIAGNOSIS — E162 Hypoglycemia, unspecified: Secondary | ICD-10-CM

## 2024-12-19 DIAGNOSIS — Z87440 Personal history of urinary (tract) infections: Secondary | ICD-10-CM

## 2024-12-19 DIAGNOSIS — E11649 Type 2 diabetes mellitus with hypoglycemia without coma: Principal | ICD-10-CM | POA: Diagnosis present

## 2024-12-19 DIAGNOSIS — Z886 Allergy status to analgesic agent status: Secondary | ICD-10-CM

## 2024-12-19 DIAGNOSIS — D638 Anemia in other chronic diseases classified elsewhere: Secondary | ICD-10-CM | POA: Diagnosis present

## 2024-12-19 DIAGNOSIS — Z515 Encounter for palliative care: Secondary | ICD-10-CM

## 2024-12-19 DIAGNOSIS — E785 Hyperlipidemia, unspecified: Secondary | ICD-10-CM | POA: Diagnosis present

## 2024-12-19 DIAGNOSIS — I5032 Chronic diastolic (congestive) heart failure: Secondary | ICD-10-CM | POA: Diagnosis present

## 2024-12-19 DIAGNOSIS — F0390 Unspecified dementia without behavioral disturbance: Secondary | ICD-10-CM | POA: Diagnosis present

## 2024-12-19 DIAGNOSIS — I110 Hypertensive heart disease with heart failure: Secondary | ICD-10-CM | POA: Diagnosis present

## 2024-12-19 DIAGNOSIS — M109 Gout, unspecified: Secondary | ICD-10-CM | POA: Diagnosis present

## 2024-12-19 LAB — CBC WITH DIFFERENTIAL/PLATELET
Abs Immature Granulocytes: 0.07 10*3/uL (ref 0.00–0.07)
Basophils Absolute: 0 10*3/uL (ref 0.0–0.1)
Basophils Relative: 0 %
Eosinophils Absolute: 0.1 10*3/uL (ref 0.0–0.5)
Eosinophils Relative: 1 %
HCT: 28.9 % — ABNORMAL LOW (ref 36.0–46.0)
Hemoglobin: 9.3 g/dL — ABNORMAL LOW (ref 12.0–15.0)
Immature Granulocytes: 1 %
Lymphocytes Relative: 15 %
Lymphs Abs: 1 10*3/uL (ref 0.7–4.0)
MCH: 29.2 pg (ref 26.0–34.0)
MCHC: 32.2 g/dL (ref 30.0–36.0)
MCV: 90.6 fL (ref 80.0–100.0)
Monocytes Absolute: 0.6 10*3/uL (ref 0.1–1.0)
Monocytes Relative: 9 %
Neutro Abs: 4.8 10*3/uL (ref 1.7–7.7)
Neutrophils Relative %: 74 %
Platelets: 157 10*3/uL (ref 150–400)
RBC: 3.19 MIL/uL — ABNORMAL LOW (ref 3.87–5.11)
RDW: 19.9 % — ABNORMAL HIGH (ref 11.5–15.5)
WBC: 6.4 10*3/uL (ref 4.0–10.5)
nRBC: 0.3 % — ABNORMAL HIGH (ref 0.0–0.2)

## 2024-12-19 LAB — URINALYSIS, ROUTINE W REFLEX MICROSCOPIC
Bilirubin Urine: NEGATIVE
Glucose, UA: >=500 mg/dL — AB
Ketones, ur: 5 mg/dL — AB
Nitrite: POSITIVE — AB
Protein, ur: 30 mg/dL — AB
Specific Gravity, Urine: 1.015 (ref 1.005–1.030)
WBC, UA: >50 WBC/hpf (ref 0–5)
pH: 5 (ref 5.0–8.0)

## 2024-12-19 LAB — COMPREHENSIVE METABOLIC PANEL WITH GFR
ALT: 40 U/L (ref 0–44)
AST: 78 U/L — ABNORMAL HIGH (ref 15–41)
Albumin: 2.5 g/dL — ABNORMAL LOW (ref 3.5–5.0)
Alkaline Phosphatase: 159 U/L — ABNORMAL HIGH (ref 38–126)
Anion gap: 14 (ref 5–15)
BUN: 12 mg/dL (ref 8–23)
CO2: 23 mmol/L (ref 22–32)
Calcium: 8.1 mg/dL — ABNORMAL LOW (ref 8.9–10.3)
Chloride: 103 mmol/L (ref 98–111)
Creatinine, Ser: 0.82 mg/dL (ref 0.44–1.00)
GFR, Estimated: >60 mL/min
Glucose, Bld: 342 mg/dL — ABNORMAL HIGH (ref 70–99)
Potassium: 3 mmol/L — ABNORMAL LOW (ref 3.5–5.1)
Sodium: 140 mmol/L (ref 135–145)
Total Bilirubin: 0.4 mg/dL (ref 0.0–1.2)
Total Protein: 6 g/dL — ABNORMAL LOW (ref 6.5–8.1)

## 2024-12-19 LAB — CBG MONITORING, ED
Glucose-Capillary: 80 mg/dL (ref 70–99)
Glucose-Capillary: 166 mg/dL — ABNORMAL HIGH (ref 70–99)

## 2024-12-19 MED ORDER — SODIUM CHLORIDE 0.9 % IV SOLN
1.0000 g | Freq: Once | INTRAVENOUS | Status: AC
  Administered 2024-12-19: 1 g via INTRAVENOUS
  Filled 2024-12-19: qty 10

## 2024-12-19 MED ORDER — DEXTROSE 50 % IV SOLN
12.5000 g | Freq: Once | INTRAVENOUS | Status: AC
  Administered 2024-12-20: 12.5 g via INTRAVENOUS
  Filled 2024-12-19: qty 50

## 2024-12-19 NOTE — Plan of Care (Signed)
   Problem: Activity: Goal: Risk for activity intolerance will decrease Outcome: Progressing   Problem: Coping: Goal: Level of anxiety will decrease Outcome: Progressing

## 2024-12-19 NOTE — ED Provider Notes (Signed)
 " [place] EMERGENCY DEPARTMENT AT [facility] Provider Note   CSN: [id number] Arrival date & time: 12/19/24  1642     Patient presents with: Hypoglycemia   [name] is a 89 y.o. female patient with history of non-insulin -dependent diabetes, chronic diastolic CHF, hypertension who presents to the emergency department today for further evaluation of hypoglycemia.  Patient was brought in from nursing facility with a blood sugar of 21.  Facility gave to IM glucagon shots and EMS gave her 25 of D10.  Her CBG was 108 on their arrival.  Patient complains of being cold but has no other complaints.  Family at bedside states that she has been a little more altered and lethargic than normal which is very indicative of when she has urinary tract infection.    Hypoglycemia      Prior to Admission medications  Medication Sig Start Date End Date Taking? Authorizing Provider  acetaminophen  (TYLENOL ) 325 MG tablet Take 2 tablets (650 mg total) by mouth every 4 (four) hours as needed for mild pain (pain score 1-3), fever or moderate pain (pain score 4-6) (or Fever >/= 101). 12/17/24   Patsy Lenis, MD  albuterol (VENTOLIN HFA) 108 (90 Base) MCG/ACT inhaler Inhale 2 puffs into the lungs every 4 (four) hours as needed for shortness of breath.    [provider]  allopurinol  (ZYLOPRIM ) 100 MG tablet Take 100 mg by mouth daily.    [provider]  ammonium lactate (AMLACTIN) 12 % cream Apply 1 Application topically in the morning and at bedtime.    [provider]  Biotin  5000 MCG CAPS Take 5,000 mcg by mouth daily. (0900)    [provider]  carvedilol  (COREG ) 6.25 MG tablet Take 6.25 mg by mouth 2 (two) times daily with a meal. Hold for HR <60 (0900 & 1700)    [provider]  cetirizine (ZYRTEC) 10 MG tablet Take 10 mg by mouth daily.    [provider]  Cholecalciferol  (VITAMIN D3) 50 MCG (2000 UT) TABS Take 50 mcg by mouth daily.  0900    [provider]  colchicine  0.6 MG tablet Take 0.6 mg by mouth 2 (two) times daily.    [provider]  Cranberry 300 MG tablet Take 300 mg by mouth daily.    [provider]  cyclobenzaprine  (FLEXERIL ) 5 MG tablet Take 1 tablet (5 mg total) by mouth 3 (three) times daily as needed for muscle spasms. Patient taking differently: Take 5 mg by mouth every 8 (eight) hours as needed for muscle spasms. 10/13/23   Raford Lenis, MD  Emollient (EUCERIN) lotion Apply 1 Application topically every 12 (twelve) hours as needed for dry skin. Apply to Rt foot/ankle/leg    [provider]  estradiol  (ESTRACE ) 0.1 MG/GM vaginal cream Discard plastic applicator. Insert a blueberry size amount (approximately 1 gram) of cream on gloved fingertip inside vagina at bedtime every night. For long term use. Patient taking differently: Place 1 g vaginally every Monday, Wednesday, and Friday. Discard plastic applicator. Insert a blueberry size amount (approximately 1 gram) of cream on gloved fingertip inside vagina at bedtime every night. For long term use. 09/05/23   Gerldine Lauraine BROCKS, FNP  ferrous sulfate 325 (65 FE) MG EC tablet Take 325 mg by mouth daily with breakfast.    [provider]  furosemide  (LASIX ) 20 MG tablet Take 10 mg by mouth daily.    [provider]  Magnesium  250 MG TABS Take  1 tablet by mouth daily.    [provider]  melatonin 3 MG TABS tablet Take 3 mg by mouth at bedtime. (2100)    [provider]  memantine (NAMENDA) 5 MG tablet Take 5 mg by mouth daily.    [provider]  Menthol, Topical Analgesic, (BIOFREEZE COLORLESS EX) Apply 1 application  topically in the morning, at noon, and at bedtime. for neck pain    [provider]  nitroGLYCERIN  (NITROSTAT ) 0.4 MG SL tablet Place 0.4 mg under the tongue every 5 (five) minutes as needed for chest pain (X 3 DOSES FOR CHEST PAIN).    [provider]   OXYGEN Inhale 2 L/min into the lungs continuous. Keep O2 saturation greater than 90%    [provider]  pantoprazole  (PROTONIX ) 40 MG tablet Take 40 mg by mouth daily. 0830    [provider]  polyethylene glycol powder (GLYCOLAX /MIRALAX ) powder Take 17 g by mouth daily. (0900)    [provider]  potassium chloride  (KLOR-CON ) 10 MEQ tablet Take 10 mEq by mouth daily.    [provider]  rosuvastatin (CRESTOR) 40 MG tablet Take 40 mg by mouth daily.    [provider]  sitaGLIPtin (JANUVIA) 50 MG tablet Take 50 mg by mouth in the morning. (0900)    [provider]  Vitamins A & D (VITAMIN A & D) ointment Apply 1 Application topically See admin instructions. Apply to buttocks/gluteal folds/Rt rear thigh for prevention of moisture associated skin damage. Apply after incontinence care every shift. MAR has 3 daily applications for this med as well as a PRN order every 12 hours as needed.    [provider]    Allergies: Aspirin , Tramadol , Naproxen, and Shellfish protein-containing drug products    Review of Systems  All other systems reviewed and are negative.   Updated Vital Signs BP 125/83   Pulse 94   Temp 98.4 F (36.9 C) (Oral)   Resp 18   Ht 4' 8 (1.422 m)   Wt 62.1 kg   SpO2 100%   BMI 30.71 kg/m   Physical Exam Vitals and nursing note reviewed.  Constitutional:      General: She is not in acute distress.    Appearance: Normal appearance.  HENT:     Head: Normocephalic and atraumatic.  Eyes:     General:        Right eye: No discharge.        Left eye: No discharge.  Cardiovascular:     Comments: Regular rate and rhythm.  S1/S2 are distinct without any evidence of murmur, rubs, or gallops.  Radial pulses are 2+ bilaterally.  Dorsalis pedis pulses are 2+ bilaterally.  No evidence of pedal edema. Pulmonary:     Comments: Clear to auscultation bilaterally.  Normal effort.  No respiratory distress.  No  evidence of wheezes, rales, or rhonchi heard throughout. Abdominal:     General: Abdomen is flat. Bowel sounds are normal. There is no distension.     Tenderness: There is no abdominal tenderness. There is no guarding or rebound.  Musculoskeletal:        General: Normal range of motion.     Cervical back: Neck supple.  Skin:    General: Skin is warm and dry.     Findings: No rash.  Neurological:     General: No focal deficit present.     Mental Status: She is alert.  Psychiatric:  Mood and Affect: Mood normal.        Behavior: Behavior normal.     (all labs ordered are listed, but only abnormal results are displayed) Labs Reviewed  CBC WITH DIFFERENTIAL/PLATELET - Abnormal; Notable for the following components:      Result Value   RBC 3.19 (*)    Hemoglobin 9.3 (*)    HCT 28.9 (*)    RDW 19.9 (*)    nRBC 0.3 (*)    All other components within normal limits  COMPREHENSIVE METABOLIC PANEL WITH GFR - Abnormal; Notable for the following components:   Potassium 3.0 (*)    Glucose, Bld 342 (*)    Calcium  8.1 (*)    Total Protein 6.0 (*)    Albumin  2.5 (*)    AST 78 (*)    Alkaline Phosphatase 159 (*)    All other components within normal limits  URINALYSIS, ROUTINE W REFLEX MICROSCOPIC - Abnormal; Notable for the following components:   APPearance CLOUDY (*)    Glucose, UA >=500 (*)    Hgb urine dipstick SMALL (*)    Ketones, ur 5 (*)    Protein, ur 30 (*)    Nitrite POSITIVE (*)    Leukocytes,Ua LARGE (*)    Bacteria, UA MANY (*)    All other components within normal limits  CBG MONITORING, ED - Abnormal; Notable for the following components:   Glucose-Capillary 166 (*)    All other components within normal limits  URINE CULTURE  CBG MONITORING, ED    EKG: EKG Interpretation Date/Time:  Thursday December 19 2024 16:58:21 EST Ventricular Rate:  95 PR Interval:  63 QRS Duration:  148 QT Interval:  462 QTC Calculation: 581 R Axis:   -74  Text  Interpretation: Ventricular-paced complexes No further analysis attempted due to paced rhythm Artifact in lead(s) I II aVR Compared with prior EKG from 12/13/2024 Confirmed by Gennaro Bouchard ([id number]) on 12/19/2024 5:59:08 PM  Radiology: No results found.   .Critical Care  Performed by: Theotis Cameron HERO, PA-C Authorized by: Theotis Cameron HERO, PA-C   Critical care provider statement:    Critical care time (minutes):  35   Critical care time was exclusive of:  Separately billable procedures and treating other patients   Critical care was necessary to treat or prevent imminent or life-threatening deterioration of the following conditions: Infection.   Critical care was time spent personally by me on the following activities:  Blood draw for specimens, development of treatment plan with patient or surrogate, discussions with consultants, ordering and performing treatments and interventions, ordering and review of radiographic studies, ordering and review of laboratory studies, pulse oximetry and re-evaluation of patient's condition   I assumed direction of critical care for this patient from another provider in my specialty: no     Care discussed with: admitting provider      Medications Ordered in the ED  dextrose  50 % solution 12.5 g (has no administration in time range)  cefTRIAXone  (ROCEPHIN ) 1 g in sodium chloride  0.9 % 100 mL IVPB (1 g Intravenous New Bag/Given 12/19/24 2146)    Clinical Course as of 12/19/24 2157  Thu Dec 19, 2024  1958 CBC with Differential(!) Hemoglobin at baseline per patient. [CF]  1958 Comprehensive metabolic panel(!) Hypokalemia.  Everything else seems to be at baseline. [CF]  1958 On repeat evaluation, patient at baseline per family.  Patient certainly more alert than when she was brought in initially.  Will plan to get a urinalysis  as family states that when she becomes a bit more altered from her baseline is using from a urinary tract infection.  This is what  she was admitted for recently.  So we will evaluate this and send off urine for culture. [CF]  2150 I spoke with Dr. Shona with [facility] who agrees to admit the patient. [CF]  2151 Urinalysis, Routine w reflex microscopic -Urine, Clean Catch(!) There is no evidence of urinary tract infection.  I reviewed her urine culture to look like it grew out multiple species during her most recent admission.  I do not see that she was discharged with any oral antibiotics upon being discharged.  Seems to still have urinary tract infection which could explain her lethargy and generalized weakness. [CF]    Clinical Course User Index [CF] Theotis Cameron HERO, PA-C    Medical Decision Making EULAMAE GREENSTEIN is a 89 y.o. female patient who presents to the emergency department today for further evaluation of hypoglycemia, lethargy, and generalized weakness.  Certainly concern for recurrent urinary tract infection considering she was just discharged to the hospital 2 days ago for similar.  Does not meet SIRS or sepsis criteria at this time.  Her sugar has been repleted via EMS and here in the emergency department.  Most recent sugar was 166.  There seems to be a discrepancy with the fingerstick and her blood glucose levels.  I suspect she might have some underlying vascular disease.  After getting her sugar normalized, patient is certainly more coherent and somewhat talkative.  However, given on the labs she still has evidence of a urinary tract infection which could explain why she is still generally lethargic and altered per family.  As highlighted in the ED course, I do not see any evidence of repeat urine culture given that the most recent 1 performed on 1/23 showed that there was multiple species and a redraw was recommended.  In addition, I did not see any evidence of oral antibiotic therapy upon discharge.  I suspect she still has a urinary tract infection which is likely why she is still not back at her  baseline.  Will plan to admit her to the hospital.  There is no evidence of leukocytosis.  The rest of her labs seem to be at baseline in comparison to previous results.  I have started her on 1 g of ceftriaxone .  I sent her urine off for an additional culture.  Again, patient does not meet sepsis criteria at this time.  Patient and family agreeable with plan to admit her to the hospital.  I spoke with Dr. Shona with [facility] who agrees to admit the patient.   Amount and/or Complexity of Data Reviewed Labs: ordered. Decision-making details documented in ED Course.  Risk Prescription drug management. Decision regarding hospitalization.     Final diagnoses:  Acute cystitis with hematuria  Hypoglycemia    ED Discharge Orders     None          Theotis Cameron HERO, [place] 12/19/24 2157    Gennaro Bouchard L, DO 12/21/24 1450  "

## 2024-12-19 NOTE — ED Triage Notes (Signed)
 Pt arrived from [place] with c/o CBG 21. Facility gave 2 IM glucagon shots and [facility] gave 25 of D%O. CBG 108. Pt on 3 lpm baseline.

## 2024-12-19 NOTE — ED Notes (Signed)
 Pt MAR states she is DNR since 12/17/24. Pt son at bedside agrees but DNR is not with pt.

## 2024-12-20 DIAGNOSIS — R4182 Altered mental status, unspecified: Secondary | ICD-10-CM | POA: Diagnosis not present

## 2024-12-20 LAB — COMPREHENSIVE METABOLIC PANEL WITH GFR
ALT: 41 U/L (ref 0–44)
AST: 74 U/L — ABNORMAL HIGH (ref 15–41)
Albumin: 2.6 g/dL — ABNORMAL LOW (ref 3.5–5.0)
Alkaline Phosphatase: 161 U/L — ABNORMAL HIGH (ref 38–126)
Anion gap: 12 (ref 5–15)
BUN: 11 mg/dL (ref 8–23)
CO2: 26 mmol/L (ref 22–32)
Calcium: 8.4 mg/dL — ABNORMAL LOW (ref 8.9–10.3)
Chloride: 106 mmol/L (ref 98–111)
Creatinine, Ser: 0.74 mg/dL (ref 0.44–1.00)
GFR, Estimated: >60 mL/min
Glucose, Bld: 94 mg/dL (ref 70–99)
Potassium: 3.5 mmol/L (ref 3.5–5.1)
Sodium: 143 mmol/L (ref 135–145)
Total Bilirubin: 0.4 mg/dL (ref 0.0–1.2)
Total Protein: 6.2 g/dL — ABNORMAL LOW (ref 6.5–8.1)

## 2024-12-20 LAB — CBC
HCT: 27.7 % — ABNORMAL LOW (ref 36.0–46.0)
Hemoglobin: 9.3 g/dL — ABNORMAL LOW (ref 12.0–15.0)
MCH: 28.9 pg (ref 26.0–34.0)
MCHC: 33.6 g/dL (ref 30.0–36.0)
MCV: 86 fL (ref 80.0–100.0)
Platelets: 158 10*3/uL (ref 150–400)
RBC: 3.22 MIL/uL — ABNORMAL LOW (ref 3.87–5.11)
RDW: 19.5 % — ABNORMAL HIGH (ref 11.5–15.5)
WBC: 8.3 10*3/uL (ref 4.0–10.5)
nRBC: 0.2 % (ref 0.0–0.2)

## 2024-12-20 LAB — GLUCOSE, CAPILLARY
Glucose-Capillary: 93 mg/dL (ref 70–99)
Glucose-Capillary: 90 mg/dL (ref 70–99)
Glucose-Capillary: 130 mg/dL — ABNORMAL HIGH (ref 70–99)
Glucose-Capillary: 144 mg/dL — ABNORMAL HIGH (ref 70–99)
Glucose-Capillary: 62 mg/dL — ABNORMAL LOW (ref 70–99)

## 2024-12-20 LAB — PHOSPHORUS: Phosphorus: 2.2 mg/dL — ABNORMAL LOW (ref 2.5–4.6)

## 2024-12-20 LAB — MAGNESIUM: Magnesium: 1.7 mg/dL (ref 1.7–2.4)

## 2024-12-20 MED ORDER — ORAL CARE MOUTH RINSE
15.0000 mL | OROMUCOSAL | Status: DC
  Administered 2024-12-20 – 2024-12-25 (×18): 15 mL via OROMUCOSAL

## 2024-12-20 MED ORDER — PROCHLORPERAZINE EDISYLATE 10 MG/2ML IJ SOLN: 5.0000 mg | Freq: Four times a day (QID) | INTRAMUSCULAR | Status: DC | PRN

## 2024-12-20 MED ORDER — POLYETHYLENE GLYCOL 3350 17 G PO PACK: 17.0000 g | PACK | Freq: Every day | ORAL | Status: DC | PRN

## 2024-12-20 MED ORDER — ENOXAPARIN SODIUM 40 MG/0.4ML IJ SOSY
40.0000 mg | PREFILLED_SYRINGE | INTRAMUSCULAR | Status: DC
  Administered 2024-12-20 – 2024-12-23 (×4): 40 mg via SUBCUTANEOUS
  Filled 2024-12-20 (×4): qty 0.4

## 2024-12-20 MED ORDER — INSULIN ASPART 100 UNIT/ML IJ SOLN: 0.0000 [IU] | Freq: Every day | INTRAMUSCULAR | Status: DC

## 2024-12-20 MED ORDER — POTASSIUM CHLORIDE 10 MEQ/100ML IV SOLN
10.0000 meq | INTRAVENOUS | Status: AC
  Administered 2024-12-20 (×3): 10 meq via INTRAVENOUS
  Filled 2024-12-20 (×3): qty 100

## 2024-12-20 MED ORDER — K PHOS MONO-SOD PHOS DI & MONO 155-852-130 MG PO TABS
500.0000 mg | ORAL_TABLET | Freq: Two times a day (BID) | ORAL | Status: DC
  Administered 2024-12-20 – 2024-12-23 (×7): 500 mg via ORAL
  Filled 2024-12-20 (×7): qty 2

## 2024-12-20 MED ORDER — LIDOCAINE 5 % EX PTCH
2.0000 | MEDICATED_PATCH | CUTANEOUS | Status: DC
  Administered 2024-12-20: 2 via TRANSDERMAL
  Filled 2024-12-20: qty 2

## 2024-12-20 MED ORDER — SODIUM CHLORIDE 0.9 % IV SOLN: INTRAVENOUS | Status: DC

## 2024-12-20 MED ORDER — MELATONIN 5 MG PO TABS: 5.0000 mg | ORAL_TABLET | Freq: Every evening | ORAL | Status: DC | PRN

## 2024-12-20 MED ORDER — POTASSIUM CHLORIDE CRYS ER 20 MEQ PO TBCR: 20.0000 meq | EXTENDED_RELEASE_TABLET | Freq: Three times a day (TID) | ORAL | Status: DC

## 2024-12-20 MED ORDER — ACETAMINOPHEN 325 MG PO TABS: 650.0000 mg | ORAL_TABLET | Freq: Four times a day (QID) | ORAL | Status: DC | PRN

## 2024-12-20 MED ORDER — ORAL CARE MOUTH RINSE: 15.0000 mL | OROMUCOSAL | Status: DC | PRN

## 2024-12-20 MED ORDER — DEXTROSE-SODIUM CHLORIDE 5-0.45 % IV SOLN: INTRAVENOUS | Status: AC

## 2024-12-20 MED ORDER — INSULIN ASPART 100 UNIT/ML IJ SOLN: 0.0000 [IU] | Freq: Three times a day (TID) | INTRAMUSCULAR | Status: DC

## 2024-12-20 MED ORDER — SODIUM CHLORIDE 0.9 % IV SOLN
1.0000 g | INTRAVENOUS | Status: DC
  Administered 2024-12-20 – 2024-12-21 (×2): 1 g via INTRAVENOUS
  Filled 2024-12-20 (×2): qty 10

## 2024-12-20 NOTE — Progress Notes (Addendum)
 [facility] PROGRESS NOTE  [name] (DOB: 08-12-1931) FMW:[id number] PCP: Wilhelmina Cedar  Brief Narrative: [name] is a 89 y.o. female with a history of NIDT2DM, CHB s/p PPM, chronic HFpEF, HTN, HLD, AOCD, chronic 2L-O2 requirement, s/p left AKA who presented to the ED on 12/19/2024 due to lethargy and severe hypoglycemia at her living facility. She was given dextrose , glucagon with improvement. Work up suggested UTI for which ceftriaxone  was started and urine culture sent. Blood sugars have shown stability.   Of note, she was admitted 1/23 - 1/27 for septic shock due to UTI, completed 5 days of ceftriaxone  and discharged off antibiotics.   Subjective: With resolution of lethargy, she's taking better po.   Objective: BP (!) 101/57 (BP Location: Left Arm)   Pulse 78   Temp 98.7 F (37.1 C) (Oral)   Resp 18   Ht 4' 8 (1.422 m)   Wt 62.1 kg   SpO2 98%   BMI 30.71 kg/m   Gen: Elderly female in no acute distress Pulm: Clear, nonlabored  CV: Regular paced GI: Soft, NT, ND, +BS  Neuro: Alert, interactive. No new focal deficits.   Assessment & Plan: Acute metabolic encephalopathy: Suspect primarily due to UTI and worsened by hypoglycemic encephalopathy.  - Continue CTX pending urine culture (last UCx nonclonal). Note recent need for vasopressor. She is normotensive at this time. - Delirium precautions - Continue regular CBG checks.   NIDT2DM: With HbA1c 5.1%, some hypoglycemia during recent admission, will not continue sitagliptin at discharge.   Supplement phosphorus and recheck in AM.   Hypokalemia resolved, recheck in AM.   Called daughter without answer.   Otherwise, per H&P this AM by Dr. Shona.  [name], MD [facility] [email or web address] 12/20/2024, 11:52 AM

## 2024-12-20 NOTE — NC FL2 (Signed)
 " [place]  MEDICAID FL2 LEVEL OF CARE FORM     IDENTIFICATION  Patient Name: [name] Birthdate: 1931/02/03 Sex: female Admission Date (Current Location): 12/19/2024  [facility] and [place] Number:  [employer] and Address:  [facility],  [address], [place] [address]      Provider Number: [id number]  Attending Physician Name and Address:  Bryn Bernardino NOVAK, MD  Relative Name and Phone Number:  Jenny Belch Daughter, Emergency Contact [phone] (Mobile)    Current Level of Care: Hospital Recommended Level of Care: Nursing Facility Prior Approval Number:    Date Approved/Denied:   PASRR Number: [id number]  Discharge Plan: SNF    Current Diagnoses: Patient Active Problem List   Diagnosis Date Noted   AMS (altered mental status) 12/19/2024   Hypophosphatemia 12/14/2024   Septic shock (HCC) 08/15/2024   UTI (urinary tract infection) 08/15/2024   Chronic respiratory failure with hypoxia and hypercapnia c/w OHS) 06/19/2023   Positive occult stool blood test 06/01/2023   Anemia 06/01/2023   Type 2 diabetes mellitus with peripheral neuropathy (HCC) 12/23/2022   Gangrene of toe of left foot (HCC) 12/22/2022   Hyponatremia 12/22/2022   Dehydration 12/22/2022   Essential hypertension 12/22/2022   Complete heart block (HCC) 01/03/2022   Difficulty walking 06/01/2017   Gout 06/01/2017   Hypoglycemia 12/31/2015   Chronic edema 04/30/2015   Elevated troponin 04/30/2015   Peripheral edema 04/30/2015   Nocturnal leg cramps 04/06/2015   Pressure in chest 01/13/2015   Generalized weakness 08/06/2014   Protein-calorie malnutrition, moderate (HCC) 08/06/2014   Osteoarthritis 04/29/2014   Anxiety 04/28/2014   Neck pain 04/28/2014   Weakness 04/21/2014   FTT (failure to thrive) in adult 04/21/2014   Chronic diastolic CHF (congestive heart failure) (HCC)    Pacemaker    Cardiac pacemaker 12/10/2011   Murmur    Hypokalemia    Ejection fraction     AV block    Pulmonary hypertension (HCC)     Orientation RESPIRATION BLADDER Height & Weight     Self, Time  O2 (2L) Incontinent Weight: 137 lb (62.1 kg) Height:  4' 8 (142.2 cm)  BEHAVIORAL SYMPTOMS/MOOD NEUROLOGICAL BOWEL NUTRITION STATUS      Incontinent Diet (See DC summary)  AMBULATORY STATUS COMMUNICATION OF NEEDS Skin   Total Care Verbally Other (Comment) (wound on right ankle and right buttocks)                       Personal Care Assistance Level of Assistance  Bathing, Feeding, Dressing Bathing Assistance: Maximum assistance Feeding assistance: Maximum assistance Dressing Assistance: Maximum assistance     Functional Limitations Info  Sight, Hearing, Speech Sight Info: Adequate Hearing Info: Adequate Speech Info: Adequate    SPECIAL CARE FACTORS FREQUENCY                       Contractures Contractures Info: Not present    Additional Factors Info  Code Status, Allergies Code Status Info: DNR Allergies Info: Aspirin , Tramadol , Naproxen, Shellfish Protein-containing Drug Products           Current Medications (12/20/2024):  This is the current hospital active medication list Current Facility-Administered Medications  Medication Dose Route Frequency Provider Last Rate Last Admin   0.9 %  sodium chloride  infusion   Intravenous Continuous Shona Terry SAILOR, DO 50 mL/hr at 12/20/24 0757 New Bag at 12/20/24 0757   acetaminophen  (TYLENOL ) tablet 650 mg  650 mg  Oral Q6H PRN Shona Terry SAILOR, DO       cefTRIAXone  (ROCEPHIN ) 1 g in sodium chloride  0.9 % 100 mL IVPB  1 g Intravenous Q24H Hall, Carole N, DO       dextrose  50 % solution 12.5 g  12.5 g Intravenous Once Kammerer, Megan L, DO       enoxaparin  (LOVENOX ) injection 40 mg  40 mg Subcutaneous Q24H Hall, Carole N, DO   40 mg at 12/20/24 1024   lidocaine  (LIDODERM ) 5 % 2 patch  2 patch Transdermal Q24H Shona Terry N, DO   2 patch at 12/20/24 0139   melatonin tablet 5 mg  5 mg Oral QHS PRN Shona Terry SAILOR, DO       phosphorus (K PHOS  NEUTRAL) tablet 500 mg  500 mg Oral BID Bryn Bernardino NOVAK, MD   500 mg at 12/20/24 1024   polyethylene glycol (MIRALAX  / GLYCOLAX ) packet 17 g  17 g Oral Daily PRN Shona Terry SAILOR, DO       prochlorperazine  (COMPAZINE ) injection 5 mg  5 mg Intravenous Q6H PRN Shona Terry SAILOR, DO         Discharge Medications: Please see discharge summary for a list of discharge medications.  Relevant Imaging Results:  Relevant Lab Results:   Additional Information SSN: [id number]  Hoy DELENA Bigness, LCSW     "

## 2024-12-20 NOTE — Progress Notes (Signed)
 Provider to be notified of family concerns and request for follow-up communication.  Patients daughter, Raoul Masker, called the unit requesting an update on her mothers health status. She expressed concern regarding the patients recurrent hypoglycemia episodes and poor oral intake. Ms. Masker stated that she is not comfortable with the patient returning to the facility at this time, as she is concerned that the patients continued poor oral intake may result in ongoing hypoglycemia. Ms. Masker requested that the patients care provider contact her to discuss the patients current condition and goals of care. Requested call-back number: [phone].

## 2024-12-20 NOTE — Inpatient Diabetes Management (Signed)
 Inpatient Diabetes Program Recommendations  AACE/ADA: New Consensus Statement on Inpatient Glycemic Control  Target Ranges:  Prepandial:   less than 140 mg/dL      Peak postprandial:   less than 180 mg/dL (1-2 hours)      Critically ill patients:  140 - 180 mg/dL    Latest Reference Range & Units 12/19/24 16:50 12/19/24 17:56 12/20/24 07:26  Glucose-Capillary 70 - 99 mg/dL 80 833 (H) 93    Latest Reference Range & Units 12/14/24 06:51  Hemoglobin A1C 4.8 - 5.6 % 5.1   Review of Glycemic Control  Diabetes history: DM2 Outpatient Diabetes medications: Januvia 50 mg daily Current orders for Inpatient glycemic control:None   Inpatient Diabetes Program Recommendations:     Outpatient DM: Given recurrent hypoglycemia and A1C of 5.1% on 12/14/24, please consider stopping Januvia outpatient.   NOTE: Patient admitted with acute encephalopathy, UTI, severe hypoglycemia (CBG 21 mg/dl at [facility]; per H&P on 8/69/73 patient was given 2 doses of IM glucagon for severe hypoglycemia at SNF). Patient was recently inpatient 1/23-1/27/26 and diabetes coordinator recommended that Januvia be stopped as outpatient. However, per discharge summary on 12/17/24, Januvia was continued.   Thanks, Earnie Gainer, RN, MSN, CDCES Diabetes Coordinator Inpatient Diabetes Program [phone] (Team Pager from 8am to 5pm)

## 2024-12-20 NOTE — Progress Notes (Signed)
 Patient experienced another episode of hypoglycemia with a capillary blood glucose (CBG) of 62 mg/dL. Per protocol, Dextrose  50% solution 12.5 g IV was administered. Repeat CBG improved to 142 mg/dL. Patients oral intake remains very poor. Provider notification: Dr. Charlton was notified of the recurrent hypoglycemia, current blood glucose results, and poor oral intake. Recommendation/request discussed regarding initiation of D5-containing IV fluids to assist with blood glucose stabilization. Awaiting further orders.

## 2024-12-21 ENCOUNTER — Encounter (HOSPITAL_COMMUNITY): Payer: Self-pay | Admitting: Internal Medicine

## 2024-12-21 DIAGNOSIS — R4182 Altered mental status, unspecified: Secondary | ICD-10-CM | POA: Diagnosis not present

## 2024-12-21 LAB — BASIC METABOLIC PANEL WITH GFR
Anion gap: 11 (ref 5–15)
Anion gap: 11 (ref 5–15)
BUN: 8 mg/dL (ref 8–23)
BUN: 7 mg/dL — ABNORMAL LOW (ref 8–23)
CO2: 25 mmol/L (ref 22–32)
CO2: 23 mmol/L (ref 22–32)
Calcium: 8 mg/dL — ABNORMAL LOW (ref 8.9–10.3)
Calcium: 8.2 mg/dL — ABNORMAL LOW (ref 8.9–10.3)
Chloride: 108 mmol/L (ref 98–111)
Chloride: 108 mmol/L (ref 98–111)
Creatinine, Ser: 0.7 mg/dL (ref 0.44–1.00)
Creatinine, Ser: 0.49 mg/dL (ref 0.44–1.00)
GFR, Estimated: >60 mL/min
GFR, Estimated: >60 mL/min
Glucose, Bld: 177 mg/dL — ABNORMAL HIGH (ref 70–99)
Glucose, Bld: 87 mg/dL (ref 70–99)
Potassium: 3 mmol/L — ABNORMAL LOW (ref 3.5–5.1)
Potassium: 3 mmol/L — ABNORMAL LOW (ref 3.5–5.1)
Sodium: 144 mmol/L (ref 135–145)
Sodium: 142 mmol/L (ref 135–145)

## 2024-12-21 LAB — COMPREHENSIVE METABOLIC PANEL WITH GFR
ALT: 37 U/L (ref 0–44)
AST: 66 U/L — ABNORMAL HIGH (ref 15–41)
Albumin: 2.2 g/dL — ABNORMAL LOW (ref 3.5–5.0)
Alkaline Phosphatase: 151 U/L — ABNORMAL HIGH (ref 38–126)
Anion gap: 11 (ref 5–15)
BUN: 9 mg/dL (ref 8–23)
CO2: 25 mmol/L (ref 22–32)
Calcium: 8.2 mg/dL — ABNORMAL LOW (ref 8.9–10.3)
Chloride: 108 mmol/L (ref 98–111)
Creatinine, Ser: 0.71 mg/dL (ref 0.44–1.00)
GFR, Estimated: >60 mL/min
Glucose, Bld: 70 mg/dL (ref 70–99)
Potassium: 3.2 mmol/L — ABNORMAL LOW (ref 3.5–5.1)
Sodium: 144 mmol/L (ref 135–145)
Total Bilirubin: 0.4 mg/dL (ref 0.0–1.2)
Total Protein: 5.9 g/dL — ABNORMAL LOW (ref 6.5–8.1)

## 2024-12-21 LAB — GLUCOSE, CAPILLARY
Glucose-Capillary: 109 mg/dL — ABNORMAL HIGH (ref 70–99)
Glucose-Capillary: 12 mg/dL — CL (ref 70–99)
Glucose-Capillary: 128 mg/dL — ABNORMAL HIGH (ref 70–99)
Glucose-Capillary: 146 mg/dL — ABNORMAL HIGH (ref 70–99)
Glucose-Capillary: 25 mg/dL — CL (ref 70–99)
Glucose-Capillary: 26 mg/dL — CL (ref 70–99)
Glucose-Capillary: 40 mg/dL — CL (ref 70–99)
Glucose-Capillary: 46 mg/dL — ABNORMAL LOW (ref 70–99)
Glucose-Capillary: 62 mg/dL — ABNORMAL LOW (ref 70–99)
Glucose-Capillary: 63 mg/dL — ABNORMAL LOW (ref 70–99)

## 2024-12-21 LAB — PHOSPHORUS: Phosphorus: 2.4 mg/dL — ABNORMAL LOW (ref 2.5–4.6)

## 2024-12-21 MED ORDER — KCL IN DEXTROSE-NACL 20-5-0.45 MEQ/L-%-% IV SOLN: INTRAVENOUS | Status: DC

## 2024-12-21 MED ORDER — POTASSIUM CHLORIDE CRYS ER 20 MEQ PO TBCR
40.0000 meq | EXTENDED_RELEASE_TABLET | Freq: Once | ORAL | Status: AC
  Administered 2024-12-21: 40 meq via ORAL
  Filled 2024-12-21: qty 2

## 2024-12-21 MED ORDER — DEXTROSE 50 % IV SOLN
25.0000 g | Freq: Once | INTRAVENOUS | Status: AC
  Administered 2024-12-21: 25 g via INTRAVENOUS

## 2024-12-21 MED ORDER — POTASSIUM CHLORIDE CRYS ER 20 MEQ PO TBCR: 40.0000 meq | EXTENDED_RELEASE_TABLET | Freq: Once | ORAL | Status: DC

## 2024-12-21 MED ORDER — GLUCOSE 40 % PO GEL
1.0000 | ORAL | Status: AC
  Administered 2024-12-21: 31 g via ORAL
  Filled 2024-12-21: qty 1.21

## 2024-12-21 NOTE — Progress Notes (Addendum)
 Tech came and got me when CBG came back as 25 Charge nurse brandie was notified we collected grape juice as well as an AMP of D5 (50mL) MD was made aware. MD arrived at bedside rechecked CBG and it was now up to 40 after AMP and juice. We tried to give food but Pt continued to pocket her food. We rechecked CBG for the 3rd time and it came back 12. Pt is A&O, drinking juice, and carrying on conversations with staff and her daughter via phone. MD put an order in for STAT labs, draw was done., Lab tech is still on floor going to other rooms for checks. Awaiting results will continue to monitor Pt closely and continuing to follow plan of care.

## 2024-12-21 NOTE — TOC Progression Note (Signed)
 Transition of Care [facility]) - Progression Note    Patient Details  Name: [name] MRN: [id number] Date of Birth: 18-Jun-1931  Transition of [facility]) CM/SW Contact  Sharlyne Stabs, RN Phone Number: 12/21/2024, 11:24 AM  Clinical Narrative:   Patient is not ready for discharge, MD and Palliative will meet with the family on Monday. IPCM following.    Expected Discharge Plan: Long Term Nursing Home Barriers to Discharge: Continued Medical Work up    Expected Discharge Plan and Services In-house Referral: Clinical Social Work Discharge Planning Services: CM Consult Post Acute Care Choice: Nursing Home Living arrangements for the past 2 months: Skilled Nursing Facility                 DME Arranged: N/A DME Agency: NA        Social Drivers of Health (SDOH) Interventions SDOH Screenings   Food Insecurity: Patient Unable To Answer (12/19/2024)  Housing: Patient Unable To Answer (12/19/2024)  Transportation Needs: Patient Unable To Answer (12/19/2024)  Utilities: Patient Unable To Answer (12/19/2024)  Social Connections: Patient Unable To Answer (12/19/2024)  Tobacco Use: Low Risk (12/13/2024)    Readmission Risk Interventions    12/17/2024   11:42 AM  Readmission Risk Prevention Plan  Transportation Screening Complete  Home Care Screening Complete  Medication Review (RN CM) Complete

## 2024-12-21 NOTE — Progress Notes (Signed)
 Patient stat lab non returned at this time, bedside sugar is currently 62.

## 2024-12-21 NOTE — Progress Notes (Signed)
 Patient alert, at bedside at this time. Complaining of some leg pain to her right leg.

## 2024-12-21 NOTE — Plan of Care (Signed)
" °  Problem: Education: Goal: Knowledge of General Education information will improve Description: Including pain rating scale, medication(s)/side effects and non-pharmacologic comfort measures Outcome: Progressing   Problem: Elimination: Goal: Will not experience complications related to bowel motility Outcome: Progressing   Problem: Pain Managment: Goal: General experience of comfort will improve and/or be controlled Outcome: Progressing   Problem: Health Behavior/Discharge Planning: Goal: Ability to manage health-related needs will improve Outcome: Not Progressing   Problem: Clinical Measurements: Goal: Ability to maintain clinical measurements within normal limits will improve Outcome: Not Progressing   Problem: Coping: Goal: Level of anxiety will decrease Outcome: Not Progressing   "

## 2024-12-21 NOTE — Plan of Care (Signed)
" °  Problem: Education: Goal: Knowledge of General Education information will improve Description: Including pain rating scale, medication(s)/side effects and non-pharmacologic comfort measures Outcome: Not Progressing   Problem: Health Behavior/Discharge Planning: Goal: Ability to manage health-related needs will improve Outcome: Not Progressing   Problem: Clinical Measurements: Goal: Ability to maintain clinical measurements within normal limits will improve Outcome: Progressing Goal: Will remain free from infection Outcome: Progressing Goal: Diagnostic test results will improve Outcome: Progressing Goal: Respiratory complications will improve Outcome: Progressing Goal: Cardiovascular complication will be avoided Outcome: Progressing   Problem: Activity: Goal: Risk for activity intolerance will decrease Outcome: Not Progressing   Problem: Nutrition: Goal: Adequate nutrition will be maintained Outcome: Not Progressing   Problem: Elimination: Goal: Will not experience complications related to bowel motility Outcome: Progressing Goal: Will not experience complications related to urinary retention Outcome: Progressing   Problem: Pain Managment: Goal: General experience of comfort will improve and/or be controlled Outcome: Progressing   Problem: Skin Integrity: Goal: Risk for impaired skin integrity will decrease Outcome: Progressing   Problem: Fluid Volume: Goal: Ability to maintain a balanced intake and output will improve Outcome: Progressing   Problem: Health Behavior/Discharge Planning: Goal: Ability to identify and utilize available resources and services will improve Outcome: Not Progressing Goal: Ability to manage health-related needs will improve Outcome: Not Progressing   Problem: Metabolic: Goal: Ability to maintain appropriate glucose levels will improve Outcome: Progressing   Problem: Nutritional: Goal: Maintenance of adequate nutrition will  improve Outcome: Progressing Goal: Progress toward achieving an optimal weight will improve Outcome: Progressing   Problem: Tissue Perfusion: Goal: Adequacy of tissue perfusion will improve Outcome: Progressing   "

## 2024-12-22 DIAGNOSIS — R4182 Altered mental status, unspecified: Secondary | ICD-10-CM | POA: Diagnosis not present

## 2024-12-22 LAB — GLUCOSE, CAPILLARY
Glucose-Capillary: 78 mg/dL (ref 70–99)
Glucose-Capillary: 85 mg/dL (ref 70–99)
Glucose-Capillary: 72 mg/dL (ref 70–99)
Glucose-Capillary: 73 mg/dL (ref 70–99)

## 2024-12-22 LAB — BASIC METABOLIC PANEL WITH GFR
Anion gap: 9 (ref 5–15)
BUN: 6 mg/dL — ABNORMAL LOW (ref 8–23)
CO2: 24 mmol/L (ref 22–32)
Calcium: 7.9 mg/dL — ABNORMAL LOW (ref 8.9–10.3)
Chloride: 108 mmol/L (ref 98–111)
Creatinine, Ser: 0.5 mg/dL (ref 0.44–1.00)
GFR, Estimated: >60 mL/min
Glucose, Bld: 70 mg/dL (ref 70–99)
Potassium: 3 mmol/L — ABNORMAL LOW (ref 3.5–5.1)
Sodium: 142 mmol/L (ref 135–145)

## 2024-12-22 LAB — URINE CULTURE: Culture: 90000 — AB

## 2024-12-22 LAB — CORTISOL-AM, BLOOD: Cortisol - AM: 11.9 ug/dL (ref 6.7–22.6)

## 2024-12-22 MED ORDER — KCL IN DEXTROSE-NACL 40-5-0.45 MEQ/L-%-% IV SOLN: INTRAVENOUS | Status: DC

## 2024-12-22 MED ORDER — SODIUM CHLORIDE 0.9 % IV SOLN
1.0000 g | Freq: Two times a day (BID) | INTRAVENOUS | Status: DC
  Administered 2024-12-22 – 2024-12-23 (×3): 1 g via INTRAVENOUS
  Filled 2024-12-22 (×4): qty 20

## 2024-12-22 NOTE — Progress Notes (Signed)
 [facility] PROGRESS NOTE  [name] (DOB: 1931-09-22) FMW:[id number] PCP: [name]  Brief Narrative: [name] is a 89 y.o. female with a history of NIDT2DM, CHB s/p PPM, chronic HFpEF, HTN, HLD, AOCD, chronic 2L-O2 requirement, s/p left AKA who presented to the ED on 12/19/2024 due to lethargy and severe hypoglycemia at her living facility. She was given dextrose , glucagon with improvement. Work up suggested UTI for which ceftriaxone  was started and urine culture sent. Blood sugars have shown stability when checked on phlebotomy.   Of note, she was admitted 1/23 - 1/27 for septic shock due to UTI, completed 5 days of ceftriaxone  and discharged off antibiotics.   Subjective: Took some potassium this morning, having difficulty per RN getting CBGs to read, but glucose ok by labs this AM. Pt is responsive and disoriented.   Objective: BP (!) 122/45 (BP Location: Left Arm)   Pulse (!) 50   Temp 98.2 F (36.8 C) (Axillary)   Resp 18   Ht 4' 8 (1.422 m)   Wt 115.6 kg   SpO2 100%   BMI 57.14 kg/m   frail elderly female in no distress, slumping in bed morbidly obese but sarcopenic clear, nonlabored  Regular bradycardia, no pitting edema dependently L high AKA, normal RLE in boot  Assessment & Plan: Acute metabolic encephalopathy: Suspect primarily due to UTI and worsened by hypoglycemic encephalopathy.  - Abx changing as below.  - Delirium precautions - Continue regular CBG checks.  - Also has early stages of dementia per daughter. Will continue discussing with her and plan on family meeting Monday once all reversible causes of derangements have been addressed to decide on plan of care going forward. The pt is currently DNR/limited interventions, and her daughter is coming to terms with and appropriately receptive to the possibility that [name] may be nearing end of life.   UTI: Cx growing ESBL E. coli, possible reason for persistent UTI.  - Change CTX to  meropenem .   NIDT2DM: With HbA1c 5.1%, ongoing hypoglycemia. Do not plan to resume any medication for this.   Hypoglycemia: Recurrent, severe. Suspect this is related to impaired gluconeogenesis, recent malnutrition, advanced age, and poor oral intake, acutely worsened in presence of infection/increased immune cell consumption.  - Also suspect that due to patient's arterial insufficiency, CBGs may be unreliable.  - AM cortisol wnl at 11.9.  - Continue dextrose  infusion and prn pushes. Not overloaded by exam.  Hypophosphatemia: due to poor oral intake.  - Supplement phosphorus as a standing order for now  Hypokalemia: Recurrent, due to poor po intake.  - Supplement in IVF. Increase to 40mEq/L and encourage po (has declined supp previously).   [name] KATHEE Come, MD [facility] [email or web address] 12/22/2024, 9:53 AM

## 2024-12-22 NOTE — Plan of Care (Signed)
   Problem: Education: Goal: Knowledge of General Education information will improve Description: Including pain rating scale, medication(s)/side effects and non-pharmacologic comfort measures Outcome: Progressing   Problem: Health Behavior/Discharge Planning: Goal: Ability to manage health-related needs will improve Outcome: Progressing   Problem: Clinical Measurements: Goal: Ability to maintain clinical measurements within normal limits will improve Outcome: Progressing   Problem: Activity: Goal: Risk for activity intolerance will decrease Outcome: Progressing   Problem: Nutrition: Goal: Adequate nutrition will be maintained Outcome: Progressing   Problem: Coping: Goal: Level of anxiety will decrease Outcome: Progressing   Problem: Pain Managment: Goal: General experience of comfort will improve and/or be controlled Outcome: Progressing

## 2024-12-23 DIAGNOSIS — R4182 Altered mental status, unspecified: Secondary | ICD-10-CM | POA: Diagnosis not present

## 2024-12-23 LAB — GLUCOSE, CAPILLARY
Glucose-Capillary: 134 mg/dL — ABNORMAL HIGH (ref 70–99)
Glucose-Capillary: 23 mg/dL — CL (ref 70–99)
Glucose-Capillary: 29 mg/dL — CL (ref 70–99)
Glucose-Capillary: 45 mg/dL — ABNORMAL LOW (ref 70–99)
Glucose-Capillary: 70 mg/dL (ref 70–99)
Glucose-Capillary: 87 mg/dL (ref 70–99)

## 2024-12-23 LAB — GLUCOSE, RANDOM: Glucose, Bld: 94 mg/dL (ref 70–99)

## 2024-12-23 MED ORDER — GLYCOPYRROLATE 1 MG PO TABS: 1.0000 mg | ORAL_TABLET | ORAL | Status: DC | PRN

## 2024-12-23 MED ORDER — MORPHINE SULFATE (CONCENTRATE) 10 MG /0.5 ML PO SOLN
5.0000 mg | ORAL | Status: DC | PRN
  Administered 2024-12-25: 5 mg via ORAL
  Filled 2024-12-23: qty 0.5

## 2024-12-23 MED ORDER — GLYCOPYRROLATE 0.2 MG/ML IJ SOLN: 0.2000 mg | INTRAMUSCULAR | Status: DC | PRN

## 2024-12-23 MED ORDER — BISACODYL 10 MG RE SUPP: 10.0000 mg | Freq: Every day | RECTAL | Status: DC | PRN

## 2024-12-23 MED ORDER — LORAZEPAM 2 MG/ML PO CONC: 1.0000 mg | ORAL | Status: DC | PRN

## 2024-12-23 MED ORDER — LORAZEPAM 2 MG/ML IJ SOLN: 1.0000 mg | INTRAMUSCULAR | Status: DC | PRN

## 2024-12-23 MED ORDER — BIOTENE DRY MOUTH MT LIQD: 15.0000 mL | OROMUCOSAL | Status: DC | PRN

## 2024-12-23 MED ORDER — DEXTROSE 50 % IV SOLN
25.0000 g | INTRAVENOUS | Status: AC
  Administered 2024-12-23: 25 g via INTRAVENOUS
  Filled 2024-12-23: qty 50

## 2024-12-23 MED ORDER — DEXTROSE 50 % IV SOLN: 1.0000 | INTRAVENOUS | Status: DC | PRN

## 2024-12-23 MED ORDER — POLYVINYL ALCOHOL 1.4 % OP SOLN: 1.0000 [drp] | Freq: Four times a day (QID) | OPHTHALMIC | Status: DC | PRN

## 2024-12-23 MED ORDER — LORAZEPAM 1 MG PO TABS: 1.0000 mg | ORAL_TABLET | ORAL | Status: DC | PRN

## 2024-12-23 MED ORDER — MORPHINE SULFATE (CONCENTRATE) 10 MG /0.5 ML PO SOLN: 5.0000 mg | ORAL | Status: DC | PRN

## 2024-12-23 NOTE — Progress Notes (Signed)
 Suction set up at bedside. Attempted to rinse/swab/suction pt's mouth with antiseptic rinse, pt refused and shut her mouth tightly. Pt states she is in no pain, and is now resting comfortably.

## 2024-12-23 NOTE — Plan of Care (Signed)

## 2024-12-23 NOTE — Plan of Care (Signed)
  Problem: Education: Goal: Knowledge of General Education information will improve Description: Including pain rating scale, medication(s)/side effects and non-pharmacologic comfort measures Outcome: Not Applicable   Problem: Health Behavior/Discharge Planning: Goal: Ability to manage health-related needs will improve Outcome: Not Applicable   Problem: Clinical Measurements: Goal: Ability to maintain clinical measurements within normal limits will improve Outcome: Not Applicable Goal: Will remain free from infection Outcome: Not Applicable Goal: Diagnostic test results will improve Outcome: Not Applicable Goal: Respiratory complications will improve Outcome: Not Applicable Goal: Cardiovascular complication will be avoided Outcome: Not Applicable   Problem: Activity: Goal: Risk for activity intolerance will decrease Outcome: Not Applicable   Problem: Nutrition: Goal: Adequate nutrition will be maintained Outcome: Not Applicable   Problem: Coping: Goal: Level of anxiety will decrease Outcome: Not Applicable   Problem: Elimination: Goal: Will not experience complications related to bowel motility Outcome: Not Applicable Goal: Will not experience complications related to urinary retention Outcome: Not Applicable   Problem: Pain Managment: Goal: General experience of comfort will improve and/or be controlled Outcome: Not Applicable   Problem: Safety: Goal: Ability to remain free from injury will improve Outcome: Not Applicable   Problem: Skin Integrity: Goal: Risk for impaired skin integrity will decrease Outcome: Not Applicable   Problem: Education: Goal: Ability to describe self-care measures that may prevent or decrease complications (Diabetes Survival Skills Education) will improve Outcome: Not Applicable Goal: Individualized Educational Video(s) Outcome: Not Applicable   Problem: Coping: Goal: Ability to adjust to condition or change in health will  improve Outcome: Not Applicable   Problem: Fluid Volume: Goal: Ability to maintain a balanced intake and output will improve Outcome: Not Applicable   Problem: Health Behavior/Discharge Planning: Goal: Ability to identify and utilize available resources and services will improve Outcome: Not Applicable Goal: Ability to manage health-related needs will improve Outcome: Not Applicable   Problem: Metabolic: Goal: Ability to maintain appropriate glucose levels will improve Outcome: Not Applicable   Problem: Nutritional: Goal: Maintenance of adequate nutrition will improve Outcome: Not Applicable Goal: Progress toward achieving an optimal weight will improve Outcome: Not Applicable   Problem: Skin Integrity: Goal: Risk for impaired skin integrity will decrease Outcome: Not Applicable   Problem: Tissue Perfusion: Goal: Adequacy of tissue perfusion will improve Outcome: Not Applicable

## 2024-12-23 NOTE — Progress Notes (Signed)
 [facility] PROGRESS NOTE  SEAN MACWILLIAMS (DOB: 01-23-1931) FMW:[id number] PCP: Wilhelmina Cedar  Brief Narrative: [name] is a 89 y.o. female with a history of NIDT2DM, CHB s/p PPM, chronic HFpEF, HTN, HLD, AOCD, chronic 2L-O2 requirement, s/p left AKA who presented to the ED on 12/19/2024 due to lethargy and severe hypoglycemia at her living facility. She was given dextrose , glucagon with improvement. Work up suggested UTI for which ceftriaxone  was started and urine culture sent. Later changed to meropenem  based on urine cultures. Laboratory abnormalities have been aggressively managed and euglycemia reached, though unfortunately, the patient's oral intake has remained minimal. Goals of care discussions are ongoing and we will transition to hospice/comfort measures at this time.   Subjective: Pt talkative but very disoriented, has no current complaints.   Objective: BP (!) 125/56 (BP Location: Left Arm)   Pulse (!) 54 Comment: notified nurse  Temp 97.8 F (36.6 C) (Axillary)   Resp 18   Ht 4' 8 (1.422 m)   Wt 115.6 kg   SpO2 (!) 81% Comment: notified nurse  BMI 57.14 kg/m   Frail elderly female in no distress, slumping in bed Morbidly obese but sarcopenic Clear, nonlabored  Regular bradycardia, no pitting edema dependently L high AKA, normal RLE in boot  Assessment & Plan: Acute metabolic encephalopathy on chronic progressive memory impairment: Suspect primarily due to UTI and worsened by hypoglycemic encephalopathy, though has persisted despite aggressive treatment of both.  - Also has early stages of dementia per daughter. We've discussed with palliative care and family that all reversible causes of derangements have been addressed though no meaningful clinical improvement has been made. [name] may be nearing end of life. Change in plan to comfort measures only. Will have hospice evaluation for residential vs. return to SNF w/hospice.   UTI: Cx growing ESBL E.  coli, possible reason for persistent UTI.  - Changed CTX to meropenem . Has remained afebrile and HDS, do not suspect this is meaningfully impacting clinical trajectory.   NIDT2DM: With HbA1c 5.1%, ongoing hypoglycemia. Do not plan to resume any medication for this.   Hypoglycemia: Recurrent, severe. Suspect this is related to impaired gluconeogenesis, recent malnutrition, advanced age, and poor oral intake, acutely worsened in presence of infection/increased immune cell consumption.  - AM cortisol wnl at 11.9.  - Continued dextrose  infusion and prn pushes still with marginal results. She needs to eat and just isn't.   Hypophosphatemia: due to poor oral intake.  - Supplemented phosphorus    Hypokalemia: Recurrent, due to poor po intake. Supplemented aggressively.   [name], MD [facility] [email or web address] 12/23/2024, 1:27 PM

## 2024-12-24 DIAGNOSIS — R4182 Altered mental status, unspecified: Secondary | ICD-10-CM | POA: Diagnosis not present

## 2024-12-24 NOTE — Plan of Care (Signed)
  Problem: Education: Goal: Knowledge of the prescribed therapeutic regimen will improve Outcome: Progressing   Problem: Role Relationship: Goal: Family's ability to cope with current situation will improve Outcome: Progressing   Problem: Pain Management: Goal: Satisfaction with pain management regimen will improve Outcome: Progressing

## 2024-12-24 NOTE — Progress Notes (Signed)
 [facility] PROGRESS NOTE  [name] (DOB: [birth year]) FMW:[id number] PCP: [name]  Brief Narrative: [name] is a 89 y.o. female with a history of NIDT2DM, CHB s/p PPM, chronic HFpEF, HTN, HLD, AOCD, chronic 2L-O2 requirement, s/p left AKA who presented to the ED on 12/19/2024 due to lethargy and severe hypoglycemia at her living facility. She was given dextrose , glucagon with improvement. Work up suggested UTI for which ceftriaxone  was started and urine culture sent. Later changed to meropenem  based on urine cultures. Laboratory abnormalities have been aggressively managed and euglycemia reached, though unfortunately, the patient's oral intake has remained minimal. Goals of care discussions are ongoing and we have transitioned to hospice/comfort measures as of 2/2.   Subjective: Resting, has no complaints.   Objective: BP 136/62 (BP Location: Left Arm)   Pulse 81   Temp 97.6 F (36.4 C) (Oral)   Resp 18   Ht 4' 8 (1.422 m)   Wt 115.6 kg   SpO2 100%   BMI 57.14 kg/m   Frail elderly female in no distress Nonlabored RRR Left AKA is stable, RLE with some edema, floated in boot.   Assessment & Plan: Acute metabolic encephalopathy on chronic progressive memory impairment: Suspect primarily due to UTI and worsened by hypoglycemic encephalopathy, though has persisted despite aggressive treatment of both.  - Also has early stages of dementia per daughter. We've discussed with palliative care and family that all reversible causes of derangements have been addressed though no meaningful clinical improvement has been made. [name] may be nearing end of life. Change in plan to comfort measures only as of 2/2. Will have hospice evaluation 2/3 for residential vs. return to SNF w/hospice.   UTI: Cx growing ESBL E. coli, possible reason for persistent UTI.  - Changed CTX to meropenem , though even before then remained afebrile and HDS, do not suspect this is meaningfully  impacting clinical trajectory.   NIDT2DM: With HbA1c 5.1%, ongoing hypoglycemia. Do not plan to resume any medication for this.   Hypoglycemia: Recurrent, severe. Suspect this is related to impaired gluconeogenesis, recent malnutrition, advanced age, and poor oral intake, acutely worsened in presence of infection/increased immune cell consumption.  - AM cortisol wnl at 11.9.  - Continued dextrose  infusion and prn pushes still with marginal results. Ongoing poor oral intake, poor prognosis.  Hypophosphatemia: due to poor oral intake.  - Supplemented phosphorus, no longer checking.  Hypokalemia: Recurrent, due to poor po intake. Supplemented aggressively. No longer checking.    [name] KATHEE Come, MD [facility] [email or web address] 12/24/2024, 9:18 AM

## 2024-12-24 NOTE — Plan of Care (Signed)

## 2024-12-25 DIAGNOSIS — Z515 Encounter for palliative care: Secondary | ICD-10-CM

## 2024-12-25 MED ORDER — MORPHINE SULFATE (CONCENTRATE) 10 MG /0.5 ML PO SOLN: 10.0000 mg | ORAL | 0 refills | Status: AC | PRN

## 2024-12-25 NOTE — Discharge Summary (Signed)
 " Physician Discharge Summary   Patient: [name] MRN: [id number] DOB: 08-25-1931  Admit date:     12/19/2024  Discharge date: 12/25/24  Discharge Physician: [name]   PCP: [name]   Recommendations at discharge:  Continue hospice care  Discharge Diagnoses: Principal Problem:   AMS (altered mental status) Active Problems:   Hospice care patient  Hospital Course: [name] is a 89 y.o. female with a history of NIDT2DM, CHB s/p PPM, chronic HFpEF, HTN, HLD, AOCD, chronic 2L-O2 requirement, s/p left AKA who presented to the ED on 12/19/2024 due to lethargy and severe hypoglycemia at her living facility. She was given dextrose , glucagon with improvement. Work up suggested UTI for which ceftriaxone  was started and urine culture sent. Later changed to meropenem  based on urine cultures. Laboratory abnormalities have been aggressively managed and euglycemia reached, though unfortunately, the patient's oral intake has remained minimal. Goals of care discussions are ongoing and we have transitioned to hospice/comfort measures as of 2/2.   Assessment and Plan: Acute metabolic encephalopathy on chronic progressive memory impairment: Suspect primarily due to UTI and worsened by hypoglycemic encephalopathy, though has persisted despite aggressive treatment of both.  - Also has early stages of dementia per daughter. We've discussed with palliative care and family that all reversible causes of derangements have been addressed though no meaningful clinical improvement has been made. [name] may be nearing end of life. Change in plan to comfort measures only as of 2/2. Hospice evaluation 2/3 > does not qualify for residential hospice yet. Will plan DC back to [facility] with hospice following closely.    UTI: Cx growing ESBL E. coli, possible reason for persistent UTI.  - Changed CTX to meropenem , though even before then remained afebrile and HDS, do not suspect this is  meaningfully impacting clinical trajectory. No further abx required.   NIDT2DM: With HbA1c 5.1%, ongoing hypoglycemia. Do not plan to resume any medication for this.    Hypoglycemia: Recurrent, severe. Suspect this is related to impaired gluconeogenesis, recent malnutrition, advanced age, and poor oral intake, acutely worsened in presence of infection/increased immune cell consumption.  - AM cortisol wnl at 11.9.  - Continued dextrose  infusion and prn pushes still with marginal results. Ongoing poor oral intake, poor prognosis.   Hypophosphatemia: due to poor oral intake.  - Supplemented phosphorus, no longer checking.   Hypokalemia: Recurrent, due to poor po intake. Supplemented aggressively. No longer checking.   Gout: Continue allopurinol    Consultants: Palliative care Procedures performed: None  Disposition: Long Term Care with Hospice Services Diet recommendation: As tolerated - Suggest dysphagia 3. DISCHARGE MEDICATION: Allergies as of 12/25/2024       Reactions   Aspirin  Rash, Other (See Comments)   GI distress   Tramadol  Other (See Comments)   GI distress   Naproxen Other (See Comments)   Unknown reaction   Shellfish Protein-containing Drug Products Itching, Rash        Medication List     STOP taking these medications    ammonium lactate 12 % cream Commonly known as: AMLACTIN   Biotin  5000 MCG Caps   carvedilol  6.25 MG tablet Commonly known as: COREG    cetirizine 10 MG tablet Commonly known as: ZYRTEC   colchicine  0.6 MG tablet   Cranberry 300 MG tablet   estradiol  0.1 MG/GM vaginal cream Commonly known as: ESTRACE    ferrous sulfate 325 (65 FE) MG EC tablet   furosemide  20 MG tablet Commonly known as: LASIX   Magnesium  250 MG Tabs   melatonin 3 MG Tabs tablet   memantine 5 MG tablet Commonly known as: NAMENDA   nitroGLYCERIN  0.4 MG SL tablet Commonly known as: NITROSTAT    pantoprazole  40 MG tablet Commonly known as: PROTONIX     potassium chloride  10 MEQ tablet Commonly known as: KLOR-CON    rosuvastatin 40 MG tablet Commonly known as: CRESTOR   sitaGLIPtin 50 MG tablet Commonly known as: JANUVIA   vitamin A & D ointment   Vitamin D3 50 MCG (2000 UT) Tabs       TAKE these medications    acetaminophen  325 MG tablet Commonly known as: TYLENOL  Take 2 tablets (650 mg total) by mouth every 4 (four) hours as needed for mild pain (pain score 1-3), fever or moderate pain (pain score 4-6) (or Fever >/= 101).   albuterol 108 (90 Base) MCG/ACT inhaler Commonly known as: VENTOLIN HFA Inhale 2 puffs into the lungs every 4 (four) hours as needed for shortness of breath.   allopurinol  100 MG tablet Commonly known as: ZYLOPRIM  Take 100 mg by mouth daily.   BIOFREEZE COLORLESS EX Apply 1 application  topically in the morning, at noon, and at bedtime. for neck pain   cyclobenzaprine  5 MG tablet Commonly known as: FLEXERIL  Take 1 tablet (5 mg total) by mouth 3 (three) times daily as needed for muscle spasms. What changed: when to take this   eucerin lotion Apply 1 Application topically every 12 (twelve) hours as needed for dry skin. Apply to Rt foot/ankle/leg   morphine  CONCENTRATE 10 mg / 0.5 ml concentrated solution Take 0.5 mLs (10 mg total) by mouth every 2 (two) hours as needed (pain, anxiety, dyspnea).   OXYGEN Inhale 2 L/min into the lungs continuous. Keep O2 saturation greater than 90%        Follow-up Information     [name], [name] Follow up.   Specialty: Skilled Nursing Facility Contact information: [address] [place] [address] [phone]                Discharge Exam: [name]   12/19/24 1700 12/22/24 0431  Weight: 62.1 kg 115.6 kg  Elderly pleasantly confused female in no distress Left AKA, RLE in boot.   Condition at discharge: Stable for discharge, guarded prognosis  The results of significant diagnostics from this hospitalization (including imaging,  microbiology, ancillary and laboratory) are listed below for reference.   Imaging Studies: DG Chest Portable 1 View Result Date: 12/13/2024 EXAM: 1 VIEW(S) XRAY OF THE CHEST 12/13/2024 05:35:02 PM COMPARISON: 08/15/2024 CLINICAL HISTORY: Fatigue, weakness, and low potassium. FINDINGS: LINES, TUBES AND DEVICES: Left chest pacemaker with leads overlying right atrium and right ventricle. LUNGS AND PLEURA: Low lung volumes. No focal pulmonary opacity. No pleural effusion. No pneumothorax. HEART AND MEDIASTINUM: No acute abnormality of the cardiac and mediastinal silhouettes. BONES AND SOFT TISSUES: Right upper quadrant surgical clips. No acute osseous abnormality. IMPRESSION: 1. No acute findings. Electronically signed by: Oneil Devonshire MD 12/13/2024 05:42 PM EST RP Workstation: [id number]   VAS US  ABI WITH/WO TBI Result Date: 12/11/2024  LOWER EXTREMITY DOPPLER STUDY Patient Name:  TABBITHA JANVRIN  Date of Exam:   12/11/2024 Medical Rec #: [id number]        Accession #:    [id number] Date of Birth: 1931-10-15       Patient Gender: F Patient Age:   63 years Exam Location:  [address] Procedure:      VAS US  ABI WITH/WO TBI Referring Phys:  THOMAS HAWKEN --------------------------------------------------------------------------------  Indications: Rest pain, ulceration, and peripheral artery disease. left AKA High Risk Factors: Hypertension, Diabetes, no history of smoking. Other Factors: CHF.  Limitations: Today's exam was limited due to patient in wheelchair. Comparison Study: 02/18/2022 Performing Technologist: Stoney Ross RVT  Examination Guidelines: A complete evaluation includes at minimum, Doppler waveform signals and systolic blood pressure reading at the level of bilateral brachial, anterior tibial, and posterior tibial arteries, when vessel segments are accessible. Bilateral testing is considered an integral part of a complete examination. Photoelectric Plethysmograph (PPG) waveforms and toe systolic  pressure readings are included as required and additional duplex testing as needed. Limited examinations for reoccurring indications may be performed as noted.  ABI Findings: +---------+------------------+-----+-------------------+----------+ Right    Rt Pressure (mmHg)IndexWaveform           Comment    +---------+------------------+-----+-------------------+----------+ Brachial 100                                                  +---------+------------------+-----+-------------------+----------+ ATA      63                0.55 dampened monophasic           +---------+------------------+-----+-------------------+----------+ PTA      8                 0.07 dampened monophasicvery faint +---------+------------------+-----+-------------------+----------+ Great Toe35                0.30                               +---------+------------------+-----+-------------------+----------+ +--------+------------------+-----+--------+-------+ Left    Lt Pressure (mmHg)IndexWaveformComment +--------+------------------+-----+--------+-------+ [id number]                                    +--------+------------------+-----+--------+-------+ +-------+-----------+-----------+------------+------------+ ABI/TBIToday's ABIToday's TBIPrevious ABIPrevious TBI +-------+-----------+-----------+------------+------------+ Right  0.55       0.30       0.74        0.38         +-------+-----------+-----------+------------+------------+ Left   AKA        AKA        0.49                     +-------+-----------+-----------+------------+------------+  Right ABIs and TBIs appear decreased compared to prior study on 02/18/2022.  Summary: Right: Resting right ankle-brachial index indicates moderate right lower extremity arterial disease. The right toe-brachial index is abnormal.  *See table(s) above for measurements and observations.  Electronically signed by Norman Serve on 12/11/2024 at  2:38:25 PM.    Final     Microbiology: Results for orders placed or performed during the hospital encounter of 12/19/24  Urine Culture     Status: Abnormal   Collection Time: 12/19/24  8:45 PM   Specimen: Urine, Clean Catch  Result Value Ref Range Status   Specimen Description   Final    URINE, CLEAN CATCH Performed at [facility], [address] [place], [place] [address]    Special Requests   Final    NONE Performed at [facility], [address], [place], [place] [address]    Culture 90,000 COLONIES/mL ESCHERICHIA COLI (A)  Final  Report Status 12/22/2024 FINAL  Final   Organism ID, Bacteria ESCHERICHIA COLI (A)  Final      Susceptibility   Escherichia coli - MIC*    AMPICILLIN >=32 RESISTANT Resistant     CEFAZOLIN  (URINE) Value in next row Resistant      >=32 RESISTANTThis is a modified FDA-approved test that has been validated and its performance characteristics determined by the reporting laboratory.  This laboratory is certified under the Clinical Laboratory Improvement Amendments CLIA as qualified to perform high complexity clinical laboratory testing.    CEFEPIME Value in next row Resistant      >=32 RESISTANTThis is a modified FDA-approved test that has been validated and its performance characteristics determined by the reporting laboratory.  This laboratory is certified under the Clinical Laboratory Improvement Amendments CLIA as qualified to perform high complexity clinical laboratory testing.    ERTAPENEM Value in next row Sensitive      >=32 RESISTANTThis is a modified FDA-approved test that has been validated and its performance characteristics determined by the reporting laboratory.  This laboratory is certified under the Clinical Laboratory Improvement Amendments CLIA as qualified to perform high complexity clinical laboratory testing.    CEFTRIAXONE  Value in next row Resistant      >=32 RESISTANTThis is a modified FDA-approved test that has been validated and its  performance characteristics determined by the reporting laboratory.  This laboratory is certified under the Clinical Laboratory Improvement Amendments CLIA as qualified to perform high complexity clinical laboratory testing.    CIPROFLOXACIN  Value in next row Intermediate      >=32 RESISTANTThis is a modified FDA-approved test that has been validated and its performance characteristics determined by the reporting laboratory.  This laboratory is certified under the Clinical Laboratory Improvement Amendments CLIA as qualified to perform high complexity clinical laboratory testing.    GENTAMICIN  Value in next row Sensitive      >=32 RESISTANTThis is a modified FDA-approved test that has been validated and its performance characteristics determined by the reporting laboratory.  This laboratory is certified under the Clinical Laboratory Improvement Amendments CLIA as qualified to perform high complexity clinical laboratory testing.    NITROFURANTOIN Value in next row Sensitive      >=32 RESISTANTThis is a modified FDA-approved test that has been validated and its performance characteristics determined by the reporting laboratory.  This laboratory is certified under the Clinical Laboratory Improvement Amendments CLIA as qualified to perform high complexity clinical laboratory testing.    TRIMETH/SULFA Value in next row Resistant      >=32 RESISTANTThis is a modified FDA-approved test that has been validated and its performance characteristics determined by the reporting laboratory.  This laboratory is certified under the Clinical Laboratory Improvement Amendments CLIA as qualified to perform high complexity clinical laboratory testing.    AMPICILLIN/SULBACTAM Value in next row Intermediate      >=32 RESISTANTThis is a modified FDA-approved test that has been validated and its performance characteristics determined by the reporting laboratory.  This laboratory is certified under the Clinical Laboratory  Improvement Amendments CLIA as qualified to perform high complexity clinical laboratory testing.    PIP/TAZO Value in next row Sensitive      <=4 SENSITIVEThis is a modified FDA-approved test that has been validated and its performance characteristics determined by the reporting laboratory.  This laboratory is certified under the Clinical Laboratory Improvement Amendments CLIA as qualified to perform high complexity clinical laboratory testing.    MEROPENEM  Value in next row Sensitive      <=  4 SENSITIVEThis is a modified FDA-approved test that has been validated and its performance characteristics determined by the reporting laboratory.  This laboratory is certified under the Clinical Laboratory Improvement Amendments CLIA as qualified to perform high complexity clinical laboratory testing.    * 90,000 COLONIES/mL ESCHERICHIA COLI    Labs: CBC: Recent Labs  Lab 12/19/24 1656 12/20/24 0419  WBC 6.4 8.3  NEUTROABS 4.8  --   HGB 9.3* 9.3*  HCT 28.9* 27.7*  MCV 90.6 86.0  PLT 157 158   Basic Metabolic Panel: Recent Labs  Lab 12/20/24 0419 12/21/24 0432 12/21/24 0835 12/21/24 1548 12/22/24 0415 12/23/24 0423  NA 143 144 144 142 142  --   K 3.5 3.2* 3.0* 3.0* 3.0*  --   CL 106 108 108 108 108  --   CO2 26 25 25 23 24   --   GLUCOSE 94 70 177* 87 70 94  BUN 11 9 8  7* 6*  --   CREATININE 0.74 0.71 0.70 0.49 0.50  --   CALCIUM  8.4* 8.2* 8.0* 8.2* 7.9*  --   MG 1.7  --   --   --   --   --   PHOS 2.2* 2.4*  --   --   --   --    Liver Function Tests: Recent Labs  Lab 12/19/24 1656 12/20/24 0419 12/21/24 0432  AST 78* 74* 66*  ALT 40 41 37  ALKPHOS 159* 161* 151*  BILITOT 0.4 0.4 0.4  PROT 6.0* 6.2* 5.9*  ALBUMIN  2.5* 2.6* 2.2*   CBG: Recent Labs  Lab 12/23/24 0049 12/23/24 0136 12/23/24 0721 12/23/24 0738 12/23/24 1108  GLUCAP 23* 87 45* 70 134*    Discharge time spent: greater than 30 minutes.  Signed: [name], MD [facility] 12/25/2024 "

## 2024-12-25 NOTE — Care Management Important Message (Signed)
 Important Message  Patient Details  Name: [name] MRN: [id number] Date of Birth: 1931/10/31   Important Message Given:  Yes - Medicare IM     [name] 12/25/2024, 11:33 AM

## 2024-12-25 NOTE — TOC Transition Note (Signed)
 Transition of Care [facility]) - Discharge Note   Patient Details  Name: [name] MRN: [id number] Date of Birth: 09/09/1931  [facility] of [facility]) CM/SW Contact:  Sharlyne Stabs, RN Phone Number: 12/25/2024, 1:00 PM   Clinical Narrative:   Patient did not qualify for [facility] residential. Family is agreeable for her to go back to [facility] with [facility].  Ozell got approval for administrator, Duaine RN here to get consent signed and contract for [employer].  Ozell provided the room number, RN calling report, Clinical sent in the hub, EMS scheduled.     Final next level of care: Long Term Nursing Home (with [facility]) Barriers to Discharge: Barriers Resolved   Patient Goals and CMS Choice Patient states their goals for this hospitalization and ongoing recovery are:: For pt to return to [email or web address].gov Compare Post Acute Care list provided to:: Patient Represenative (must comment) Choice offered to / list presented to : Adult Children      Discharge Placement              Patient [facility] bed at: [facility] Patient to be transferred to facility by: EMS Name of family member notified: Raoul and sons at bedside Patient and family notified of of transfer: 12/25/24  Discharge Plan and Services Additional resources added to the After Visit Summary for   In-house Referral: Clinical Social Work Discharge Planning Services: CM Consult Post Acute Care Choice: Nursing Home          DME Arranged: N/A DME Agency: NA         HH Agency: [facility]        Social Drivers of Health (SDOH) Interventions SDOH Screenings   Food Insecurity: Patient Unable To Answer (12/19/2024)  Housing: Patient Unable To Answer (12/19/2024)  Transportation Needs: Patient Unable To Answer (12/19/2024)  Utilities: Patient Unable To Answer (12/19/2024)  Social Connections: Patient Unable To Answer (12/19/2024)  Tobacco Use: Low Risk (12/21/2024)      Readmission Risk Interventions    12/17/2024   11:42 AM  Readmission Risk Prevention Plan  Transportation Screening Complete  Home Care Screening Complete  Medication Review (RN CM) Complete

## 2024-12-25 NOTE — Plan of Care (Signed)
  Problem: Coping: Goal: Ability to identify and develop effective coping behavior will improve Outcome: Progressing   Problem: Role Relationship: Goal: Family's ability to cope with current situation will improve Outcome: Progressing   Problem: Pain Management: Goal: Satisfaction with pain management regimen will improve Outcome: Progressing

## 2024-12-30 ENCOUNTER — Ambulatory Visit: Admitting: Gastroenterology

## 2025-01-06 ENCOUNTER — Ambulatory Visit: Payer: Medicare Other

## 2025-04-07 ENCOUNTER — Ambulatory Visit

## 2025-07-07 ENCOUNTER — Ambulatory Visit

## 2025-10-06 ENCOUNTER — Ambulatory Visit

## 2026-01-05 ENCOUNTER — Ambulatory Visit

## 2026-04-06 ENCOUNTER — Ambulatory Visit
